# Patient Record
Sex: Female | Born: 1940
Health system: Southern US, Community
[De-identification: ages and names within clinical notes are randomized; demographics above are authoritative.]

## PROBLEM LIST (undated history)

## (undated) DIAGNOSIS — Z923 Personal history of irradiation: Secondary | ICD-10-CM

## (undated) DIAGNOSIS — C9002 Multiple myeloma in relapse: Secondary | ICD-10-CM

## (undated) DIAGNOSIS — I10 Essential (primary) hypertension: Secondary | ICD-10-CM

## (undated) DIAGNOSIS — E7800 Pure hypercholesterolemia, unspecified: Secondary | ICD-10-CM

## (undated) DIAGNOSIS — C801 Malignant (primary) neoplasm, unspecified: Secondary | ICD-10-CM

## (undated) DIAGNOSIS — M549 Dorsalgia, unspecified: Secondary | ICD-10-CM

## (undated) HISTORY — DX: Pure hypercholesterolemia, unspecified: E78.00

## (undated) HISTORY — PX: BONE MARROW BIOPSY: SHX199

## (undated) HISTORY — DX: Essential (primary) hypertension: I10

## (undated) HISTORY — DX: Personal history of irradiation: Z92.3

---

## 1999-03-22 HISTORY — PX: TOTAL KNEE ARTHROPLASTY: SHX125

## 1999-07-27 ENCOUNTER — Other Ambulatory Visit: Admission: RE | Admit: 1999-07-27 | Discharge: 1999-07-27 | Payer: Self-pay | Admitting: Obstetrics and Gynecology

## 2000-08-01 ENCOUNTER — Encounter: Payer: Self-pay | Admitting: Specialist

## 2000-08-04 ENCOUNTER — Inpatient Hospital Stay (HOSPITAL_COMMUNITY): Admission: RE | Admit: 2000-08-04 | Discharge: 2000-08-07 | Payer: Self-pay | Admitting: Specialist

## 2000-08-28 ENCOUNTER — Encounter (HOSPITAL_COMMUNITY): Admission: RE | Admit: 2000-08-28 | Discharge: 2000-09-27 | Payer: Self-pay | Admitting: *Deleted

## 2000-09-20 ENCOUNTER — Other Ambulatory Visit: Admission: RE | Admit: 2000-09-20 | Discharge: 2000-09-20 | Payer: Self-pay | Admitting: Obstetrics and Gynecology

## 2000-09-28 ENCOUNTER — Encounter (HOSPITAL_COMMUNITY): Admission: RE | Admit: 2000-09-28 | Discharge: 2000-10-18 | Payer: Self-pay | Admitting: *Deleted

## 2001-06-12 ENCOUNTER — Encounter: Payer: Self-pay | Admitting: Internal Medicine

## 2001-06-12 ENCOUNTER — Ambulatory Visit (HOSPITAL_COMMUNITY): Admission: RE | Admit: 2001-06-12 | Discharge: 2001-06-12 | Payer: Self-pay | Admitting: Internal Medicine

## 2001-06-21 ENCOUNTER — Ambulatory Visit (HOSPITAL_COMMUNITY): Admission: RE | Admit: 2001-06-21 | Discharge: 2001-06-21 | Payer: Self-pay | Admitting: Internal Medicine

## 2001-06-21 ENCOUNTER — Encounter: Payer: Self-pay | Admitting: Internal Medicine

## 2001-10-01 ENCOUNTER — Emergency Department (HOSPITAL_COMMUNITY): Admission: EM | Admit: 2001-10-01 | Discharge: 2001-10-01 | Payer: Self-pay | Admitting: Emergency Medicine

## 2001-11-06 ENCOUNTER — Other Ambulatory Visit: Admission: RE | Admit: 2001-11-06 | Discharge: 2001-11-06 | Payer: Self-pay | Admitting: Obstetrics and Gynecology

## 2001-12-05 ENCOUNTER — Ambulatory Visit (HOSPITAL_COMMUNITY): Admission: RE | Admit: 2001-12-05 | Discharge: 2001-12-05 | Payer: Self-pay | Admitting: Internal Medicine

## 2001-12-05 ENCOUNTER — Encounter: Payer: Self-pay | Admitting: Internal Medicine

## 2002-06-13 ENCOUNTER — Ambulatory Visit (HOSPITAL_COMMUNITY): Admission: RE | Admit: 2002-06-13 | Discharge: 2002-06-13 | Payer: Self-pay | Admitting: Internal Medicine

## 2002-06-13 ENCOUNTER — Encounter: Payer: Self-pay | Admitting: Internal Medicine

## 2002-11-20 ENCOUNTER — Other Ambulatory Visit: Admission: RE | Admit: 2002-11-20 | Discharge: 2002-11-20 | Payer: Self-pay | Admitting: Obstetrics and Gynecology

## 2002-12-05 ENCOUNTER — Ambulatory Visit (HOSPITAL_COMMUNITY): Admission: RE | Admit: 2002-12-05 | Discharge: 2002-12-05 | Payer: Self-pay | Admitting: Obstetrics and Gynecology

## 2003-07-01 ENCOUNTER — Ambulatory Visit (HOSPITAL_COMMUNITY): Admission: RE | Admit: 2003-07-01 | Discharge: 2003-07-01 | Payer: Self-pay | Admitting: Internal Medicine

## 2003-10-28 ENCOUNTER — Other Ambulatory Visit: Admission: RE | Admit: 2003-10-28 | Discharge: 2003-10-28 | Payer: Self-pay | Admitting: Obstetrics and Gynecology

## 2004-11-01 ENCOUNTER — Other Ambulatory Visit: Admission: RE | Admit: 2004-11-01 | Discharge: 2004-11-01 | Payer: Self-pay | Admitting: Obstetrics and Gynecology

## 2007-01-11 ENCOUNTER — Ambulatory Visit (HOSPITAL_COMMUNITY): Admission: RE | Admit: 2007-01-11 | Discharge: 2007-01-11 | Payer: Self-pay | Admitting: Obstetrics and Gynecology

## 2007-06-04 ENCOUNTER — Ambulatory Visit (HOSPITAL_COMMUNITY): Admission: RE | Admit: 2007-06-04 | Discharge: 2007-06-04 | Payer: Self-pay | Admitting: Internal Medicine

## 2008-06-16 ENCOUNTER — Encounter: Admission: RE | Admit: 2008-06-16 | Discharge: 2008-06-16 | Payer: Self-pay | Admitting: Specialist

## 2010-03-24 ENCOUNTER — Ambulatory Visit (HOSPITAL_COMMUNITY)
Admission: RE | Admit: 2010-03-24 | Discharge: 2010-03-24 | Payer: Self-pay | Source: Home / Self Care | Attending: Internal Medicine | Admitting: Internal Medicine

## 2010-04-11 ENCOUNTER — Encounter: Payer: Self-pay | Admitting: Obstetrics and Gynecology

## 2010-08-06 NOTE — Discharge Summary (Signed)
[place]. [facility]  Patient:    Jamie Ewing, Jamie Ewing                       MRN: [id number] Adm. Date:  [id number] Disc. Date: [id number] Attending:  Erasmo Leventhal Dictator:   Dorie Rank, P.A. CC:         Dr. Nickola Major, [address], [place], [place] [address]   Discharge Summary  ADMISSION DIAGNOSIS:  Left knee medial compartment arthritis.  DISCHARGE DIAGNOSES: 1. Left knee medial compartment arthritis. 2. Mild postoperative hemorrhagic anemia, resolved.  PROCEDURE:  Left knee medial unicompartmental knee arthroplasty, surgeon Dr. Benny Lennert, assistant Dorie Rank, P.A.-C.  Anesthesia general followed by epidural.  No complications.  CONSULTATIONS:  None.  HISTORY OF PRESENT ILLNESS:  Ms. Melucci is a pleasant 70 year old female who has had medial left knee pain for quite some time now.  She tried nonsteroidal, knee injections and underwent a knee arthroscopy with partial medial meniscectomy and chondroplasty of the medial femoral condyle.  Her pain has been refractory to conservative treatment and is interfering with her activities of daily living.  Radiograph on June 29, 2000, of the left knee showed obvious narrowing and degeneration of the medial compartment of the left knee.  On reviewing her surgery notes from her arthroscopy, it was noted that the disease process was confined to the medial compartment with advanced changes there.  It was felt that due to the above-stated, she would benefit from undergoing a left knee unicompartmental arthroplasty.  The risks, benefits and the procedure were discussed with the patient and at that point she elected to proceed.  HOSPITAL COURSE:  The patient was admitted on Aug 04, 2000, and had the above-stated surgery without complications.  Postoperatively, she used a Fentanyl epidural for 24 hours for pain control.  After this was discontinued on Aug 05, 2000, she utilized one enteric coated aspirin a day for  DVT prophylaxis.  She utilized Darvocet for pain and IV morphine for her severe pain.  However, she was utilizing only Darvocet prior to discharge.  While in the operating room, her wound was dressed in a sterile fashion.  Her dressing was noted to be dry and intact on postop day #1.  On postop day #2, her dressing was changed and her wound was found to be free of any erythema or discharge.  Throughout the remainder of her hospital stay, her wound was monitored daily and found to be free of discharge or erythema.  While in the operating room, a Foley catheter was placed.  This was discontinued on postop day #1 and she was voiding well on her own throughout the remainder of her hospital stay.  A Hemovac drain was placed in the wound while in the operating room.  This was discontinued on postop day #1 without any difficulty.  The patient worked with physical therapy per the total knee protocol.  She was placed on weightbearing as tolerated activity status with walker and assistance.  She progressed well with physical therapy.  On the date of discharge, she was ambulating 210 feet.  Postoperatively, IV Ancef was used for 48 hours for postop infection control.  The CPM machine was utilized q.8h. and she tolerated this well throughout her hospital stay.  She wore a knee immobilizer to her left knee when walking.  A hemoglobin and hematocrit were obtained daily while in the hospital and her hemoglobin was found to be stable and she did not  require any blood transfusions.  A BMP was obtained on Aug 05, 2000, and was found to be within normal limits.  She utilized a nasal cannula of 2 L of oxygen for 24 hours postoperatively.  IV of D-5 1/2 normal saline at 75 cc per hour was started postoperatively.  This was KVO on postop day #1 when she was noted to be taking fluids well and discontinued prior to her discharge home.  Her diet was advanced as tolerated and by postop day #1, she was tolerating  food well.  She utilized a bed side incentive spirometer q.h. from 7 a.m. to 10 p.m. for 10 minutes throughout her hospital stay.  On Aug 07, 2000, the patient was progressing well with physical therapy and was felt to be medically stable for discharge.  Discharge planning worked with the patient for any types of durable medical equipment to be needed at home.  LABORATORY DATA AND X-RAY FINDINGS:  Hemoglobin and hematocrit on Aug 05, 2000, was 11.4 and 32.6 respectively.  On Aug 06, 2000, red blood cells are 3.83, hemoglobin 12.4, hematocrit 35.3.  On Aug 07, 2000, RBC was 3.98, hemoglobin 12.6 and hematocrit 36.7.  BMP on Aug 07, 2000, revealed a CO2 of 32, otherwise within normal limits.  PT on Aug 01, 2000, was 12.5, INR 0.9 and PTT 30.  Preop UA on Aug 01, 2000, was negative.  On Aug 01, 2000, ALT 21, Alk phos 73, total bilirubin 0.8.  EKG on Aug 01, 2000, revealed normal sinus rhythm and was confirmed by Dr. Nila Nephew.  X-rays are not available in the chart.  CONDITION ON DISCHARGE:  Improved.  DISPOSITION:  The patient was discharged home.  SPECIAL INSTRUCTIONS:  She was to have outpatient physical therapy.  She can shower on postop day #5 along as her wound was not draining to keep it covered and dry in the shower.  Dressing changes daily.  She was to call should she have any other questions or problems before her appointment.  Two Tylenol p.r.n. fever, if greater than 101 call the office.  DISCHARGE MEDICATIONS: 1. Darvocet-N 100 #30 with no refills. 2. Robaxin #30 with no refills. 3. Take an over-the-counter enteric coated aspirin one p.o. q.d.  DIET:  Regular diet.  FOLLOWUP:  Follow up with Dr. Thomasena Edis in two weeks from the date of surgery. She is to call for an appointment. DD:  08/18/00 TD:  08/18/00 Job: [id number]

## 2010-08-06 NOTE — Op Note (Signed)
[place]  Patient:    Jamie Ewing, Jamie Ewing                       MRN: [id number] Proc. Date: 08/04/00 Adm. Date:  [id number] Attending:  Erasmo Leventhal                           Operative Report  PREOPERATIVE DIAGNOSIS:  Left knee medial compartment _____ arthrosis.  POSTOPERATIVE DIAGNOSIS:  Left knee medial compartment _____ arthrosis.  PROCEDURE:  Left knee medial unicompartmental knee arthroplasty.  SURGEON:  R. Valma Cava, M.D.  ASSISTANT:  Dorie Rank, P.A.  ANESTHESIA:  General followed by epidural.  ESTIMATED BLOOD LOSS:  Less than 100 cc.  DRAINS:  One Hemovac.  COMPLICATIONS:  None.  DISPOSITION:  To recovery room stable.  OPERATIVE IMPLANTS:  The [employer] medial unicompartmental knee arthroplasty system was utilized, the Eius system.  An 8 mm tibia, small femur, both cemented.  DESCRIPTION OF PROCEDURE:  The patient was counseled in the holding area.  I reviewed the surgical procedure with her again in detail, risks and benefits, and she wished to proceed.  IV antibiotics were given.  She was taken to the OR, placed in the supine position under general anesthesia.  The left knee was examined and revealed full extension and flexion to 125.  No significant angular deformity.  Elevated, prepped and draped out in a sterile fashion. Tourniquet was inflated to 350 mmHg.  An anterior incision made through the skin and subcutaneous tissue, small veins electrocoagulated, skin flaps were developed.  Medial parapatellar arthrotomy was performed.  The patella was translated laterally but was not dislocated.  The knee was inspected.  The patellofemoral joint was unremarkable.  The lateral compartment was unremarkable.  The intercondylar notch and anterior and posterior cruciate ligament, and medial compartment were inspected.  Unfortunately, she had bone-on-bone contact, grade 4, with erosions full-thickness.  Some of  the fat pad was resected.  The anterior horn of the medial meniscus was removed.  We used the standard surgical technique.  The tibial extramedullary alignment guide was utilized, set for the correct alignment in the sagittal and frontal planes, and guide pins were then used to hold the tibial resection block in place.  With sizes of 2, 4, and 6 trial, we took a 4 mm cut off the proximal tibia.  Initially the reciprocating saw was used to make the sagittal cut at the appropriate level, going through the tibial spine, and this was just medial to the ACL insertion, protecting the posterior aspect of the knee and soft tissue structures throughout the case.  The transverse cut was then made. At this point with the tibial spacer, which was a little bit tight, we took another 2 mm off the tibia.  At this point, I could easily get in the 8 mm jig, meaning she was well-balanced in flexion.  Attention was directed to the femur, and the femur was sized, found to be a small.  At this point in time, the femoral cutting guide was placed using standard technique.  Extramedullary guide was hooked to this.  We checked the position based on the femoral head and shaft.  I had previously marked out the _____ over the femoral head and could palpate that.  After setting this and determining that she was a small, pins were placed, and then the central peg was drilled and left  in place, and then the posterior femoral condyle cut was made, removing 6 mm of the posterior femur.  At this time, this was then removed.  The reciprocating saw was utilized for vertical cut, and then the femoral burring jig was applied.  There was a little bit of an offset.  It was accommodated for, and the bow was used in standard fashion and it was removed, and the remainder of the bone was removed with a rongeur.  At this point in time, we had difficulty getting a posterior femoral condyle flush fit, and then the femoral component.   Each step was re-done, taking a little bit more of the posterior femoral condyle, and re-burring.  Eventually we got a satisfactory fit.  With the small in place, we then sized the tibia.  The tibia was an 8 mm medium.  The appropriate jig was in place.  She was then put through a range of motion with trials in place.  She was well-balanced in flexion and extension, and axial and mechanical axis and anatomical axis. Also, the soft tissues were well-balanced.  The tibial trial was then removed, and the tibial keel was performed in a standard fashion.  At this point in time utilizing Modern cement technique, pulsatile lavage, etc., the femoral component was then cemented into place. At this point, I was having difficulty getting into the tibial component with this, and therefore it was decided to do it in two stages.  The femoral component was then firmly cemented in place, excess cement was removed, she was well-seated, put another 8 mm medium trial in, had excellent range of motion and soft tissue balance.  This was then removed.  Sponge was placed. The knee was then wrapped, and the tourniquet was deflated after 2 hours and 9 minutes.  At this point in time, I then let the knee revascularize for the next 15 minutes while another batch of cement was made.  At this point in time, the knee was then exsanguinated again after 15 minutes revascularization.  It was again irrigated and dried, and then the tibial component, 8 mm thickness with a keel, was cemented into place with excellent technique.  The knee had been placed into full extension while this was being cured, and then after 10 minutes the tourniquet was decreased to let down again.  At this point in time, she was well-seated on both femur and tibia. It was extremely well-balanced and had excellent alignment.  Hemostasis had been obtained.  There was minimal bleeding.  A medium Hemovac drain was placed and brought out through a  separate stab wound.  The knee was then closed in layers.  The arthrotomy and _____ was closed with Vicryl, the subcutaneous tissue Vicryl, skin closed with staples.  Also note that after the first  letdown of the tourniquet, we gave her another gram of Ancef intravenously. After the skin was closed, dressings were applied, the drain was hooked to suction.  She had excellent pulse of the foot and ankle at the end of the case, excellent alignment, and well-balanced, full range of motion.  She was then turned to the lateral position, and an epidural catheter was placed for postoperative pain control.  She was then awakened, extubated, and taken out from the operating room to PACU in stable condition.  There were no complications.  Sponge and needle count were correct. DD:  08/04/00 TD:  08/07/00 Job: [id number]

## 2010-08-06 NOTE — Procedures (Signed)
. [facility]  Patient:    Jamie Ewing, Jamie Ewing                       MRN: [id number] Proc. Date: 07/30/00 Adm. Date:  [id number] Attending:  Erasmo Leventhal CC:         Anesthesia Department                           Procedure Report  PROCEDURE:  Epidural catheter placement for postoperative pain relief.  DESCRIPTION OF PROCEDURE:  I was consulted by Reuel Boom L. Thomasena Edis, M.D., for placement of an epidural catheter into [name] for postoperative pain relief.  I had the opportunity to discuss the procedure with the patient preoperatively.  She elected to the catheter placement.  At the end of the procedure, prior to emergence from anesthesia, the patient was turned in the left lateral decubitus position.  The back was prepped with Betadine.  Using a #17 Tuohy needle, the epidural space was easily cannulated at the L2-3 interspace in the midline using a loss of resistance technique. The catheter was inserted 5 cm into the epidural space and the needle removed. After negative aspiration, 100 mcg of fentanyl with 10 cc of 0.5% Xylocaine was incrementally injected.  The catheter was then firmly affixed to the patients back.  She was turned supine, extubated, and taken to the recovery room in stable condition.  In the PACU, a fentayl/Marcaine infusion was begun, and she will be followed on the floor by anesthesiology service. DD:  08/04/00 TD:  08/07/00 Job: [id number]

## 2010-08-06 NOTE — H&P (Signed)
[place]. [facility]  Patient:    Jamie Ewing, Jamie Ewing                         MRN: [id number] Adm. Date:  08/04/00 Attending:  R. Valma Cava, M.D. Dictator:   Dorie Rank, P.A.-C. CC:         Kingsley Callander. Ouida Sills, M.D., [place], [place]   History and Physical  DATE OF BIRTH: 09/10/1940  CHIEF COMPLAINT: Left knee pain.  HISTORY OF PRESENT ILLNESS: Ms. Galligan is a pleasant 70 year old female who has had medial left knee pain for quite some time now.  She has tried steroid injections to the knee and has undergone left knee arthroscopy with partial medial meniscectomy and chondroplasty of the medial femoral condyle.  The pain is to the point where it is interfering with her activities of daily living and she presented to the office stating that she wished to do something permanent.  On physical examination it was noted that she had neutral alignment of the left knee, trace effusion, and range of motion to the left knee was 0 to about 130 degrees.  The cruciate and collateral ligaments were noted to be stable on physical examination.  She had diffuse tenderness over the medial joint line.  Radiographs from June 29, 2000 of the left knee showed obvious narrowing and degeneration of the medial compartment.  On review of her surgery notes from her arthroscopy it was noted that the disease process was confined to the medial compartment with advanced changes there. Due to failure of conservative treatment it was felt that she would best benefit from undergoing a left knee unicompartmental arthroplasty.  The risks and benefits as well as the procedure were discussed with the patient and at that point she elected to proceed.  She obtained medical clearance from her family medical doctor, Dr. Kingsley Callander. Ouida Sills of [place], [place].  PAST MEDICAL HISTORY: Osteoarthritis of bilateral knees, with left greater than right.  Otherwise, she has been in good health.  She did have  a large cheloid scar formation on her chest about five years ago which she did have successfully removed.  PAST SURGICAL HISTORY:  1. Cheloid scar removal to anterior chest x 4-5 years ago.  2. Left knee arthroscopy with partial medial meniscectomy and chondroplasty     of the medial femoral condyle.  CURRENT MEDICATIONS:  1. Vioxx 25 mg one p.o. q.d.  2. Glucosamine chondroitin p.o. q.d.  3. Tylenol Arthritis p.r.n.  ALLERGIES: No known drug allergies.  SOCIAL HISTORY: The patient is married and has two children, one daughter who lives at home with her and can help her after the surgery.  She smokes approximately one packs of cigarettes per week.  She denies any regular use of alcohol.  She plans for home health physical therapy.  She did not donate any autologous blood for this procedure.  FAMILY HISTORY: Mother deceased at age 93, history of tuberculosis.  Father deceased at age 16 with history of of kidney failure.  She does have a paternal aunt with diabetes mellitus.  REVIEW OF SYSTEMS: GENERAL: No fever, chills, night sweats, or bleeding tendencies.  PULMONARY: No shortness of breath, productive cough, or hemoptysis.  CARDIOVASCULAR: No chest pain, angina, or orthopnea.  GI: No nausea, vomiting, diarrhea, melena, or bloody stools.  She does have occasional constipation, which she utilizes FiberCon for.  GU: No dysuria, hematuria, or discharge.  MUSCULOSKELETAL: Left knee pain  as described above in the History of Present Illness.  CNS: No blurred or double vision, seizures, headache, or paralysis.  PHYSICAL EXAMINATION:  GENERAL: Pleasant 71 year old black female, well-developed, well-nourished, and in no acute distress.  VITAL SIGNS: Pulse 80, respirations 20, blood pressure 143/75.  HEENT: Head normocephalic, atraumatic.  Oropharynx clear.  NECK: Supple, negative for carotid bruits bilaterally.  CHEST: Lungs clear to auscultation bilaterally.  No wheezes, rales  or rhonchi.  BREAST: Not pertinent to present illness.  HEART: Regular rate and rhythm.  No murmurs, rubs, or gallops.  S1 and S2 heard on auscultation.  ABDOMEN: Soft, nontender.  Positive bowel sounds in all four quadrants.  GU: Not pertinent to present illness.  EXTREMITIES: Please see History of Present Illness for physical examination of left knee.  SKIN: No rashes or lesions appreciated on examination.  The patient does have a large scar to her anterior chest wall.  She has three scars to her anterior knee that show no cheloid formation.  LABORATORY DATA: Laboratories and x-rays are pending at this time.  IMPRESSION: Left knee osteoarthritis.  PLAN: The patient is scheduled for left knee unicompartmental arthroplasty by Dr. Benny Lennert at [facility] on Aug 04, 2000.DD: 07/27/00 TD:  07/28/00 Job: [id number]

## 2011-05-31 ENCOUNTER — Encounter (INDEPENDENT_AMBULATORY_CARE_PROVIDER_SITE_OTHER): Payer: Self-pay | Admitting: Surgery

## 2011-05-31 ENCOUNTER — Ambulatory Visit (INDEPENDENT_AMBULATORY_CARE_PROVIDER_SITE_OTHER): Payer: BC Managed Care – PPO | Admitting: Surgery

## 2011-05-31 VITALS — BP 142/82 | HR 92 | Resp 16 | Ht 63.0 in | Wt 197.0 lb

## 2011-05-31 DIAGNOSIS — D1720 Benign lipomatous neoplasm of skin and subcutaneous tissue of unspecified limb: Secondary | ICD-10-CM

## 2011-05-31 DIAGNOSIS — D1739 Benign lipomatous neoplasm of skin and subcutaneous tissue of other sites: Secondary | ICD-10-CM

## 2011-05-31 NOTE — Patient Instructions (Signed)
Lipoma A lipoma is a noncancerous (benign) tumor composed of fat cells. They are usually found under the skin (subcutaneous). A lipoma may occur in any tissue of the body that contains fat. Common areas for lipomas to appear include the back, shoulders, buttocks, and thighs. Lipomas are a very common soft tissue growth. They are soft and grow slowly. Most problems caused by a lipoma depend on where it is growing. DIAGNOSIS  A lipoma can be diagnosed with a physical exam. These tumors rarely become cancerous, but radiographic studies can help determine this for certain. Studies used may include:  Computerized X-ray scans (CT or CAT scan).   Computerized magnetic scans (MRI).  TREATMENT  Small lipomas that are not causing problems may be watched. If a lipoma continues to enlarge or causes problems, removal is often the best treatment. Lipomas can also be removed to improve appearance. Surgery is done to remove the fatty cells and the surrounding capsule. Most often, this is done with medicine that numbs the area (local anesthetic). The removed tissue is examined under a microscope to make sure it is not cancerous. Keep all follow-up appointments with your caregiver. SEEK MEDICAL CARE IF:   The lipoma becomes larger or hard.   The lipoma becomes painful, red, or increasingly swollen. These could be signs of infection or a more serious condition.  Document Released: 02/25/2002 Document Revised: 02/24/2011 Document Reviewed: 08/07/2009 [employer] Patient Information 2012 [employer]. 

## 2011-05-31 NOTE — Progress Notes (Signed)
  CC: Lump in her right axillary area HPI: This patient has a small lump in the anterior axillary line high in the axilla. She noticed about six or seven months ago. He thinks perhaps gotten a little bit larger. It is not painful and has not become symptomatic in any fashion. Her primary care physician thought it was probably benign but asked [place] to see her in case she wished to have this removed.   ROS: Her 12 point review of systems was negative.  MEDS:  No current outpatient prescriptions on file prior to visit.    ALLERGIES:  No Known Allergies   PE Vital signs:BP 142/82  Pulse 92  Resp 16  Ht 5\' 3"  (1.6 m)  Wt 197 lb (89.359 kg)  BMI 34.90 kg/m2  General: The patient is alert oriented healthy appearing Axilla: In the right anterior axillary line at the very superior edge is a 1.5 cm soft, mobile, nontender mass. It is visible is a slight protrusion. There is no evidence of other adenopathy or problems.  Data Reviewed I have reviewed the notes from her primary physician  Assessment Lipoma right axillary area  Plan I discussed alternatives including excision versus followup. I told her that this appears to be benign and can safely be followed. If he becomes symptomatic or margins then we should reconsider that decision and excise it. She was happy with followup.  CC; Dr Ouida Sills

## 2014-07-21 DIAGNOSIS — E785 Hyperlipidemia, unspecified: Secondary | ICD-10-CM | POA: Diagnosis not present

## 2014-07-21 DIAGNOSIS — Z79899 Other long term (current) drug therapy: Secondary | ICD-10-CM | POA: Diagnosis not present

## 2014-07-21 DIAGNOSIS — M1990 Unspecified osteoarthritis, unspecified site: Secondary | ICD-10-CM | POA: Diagnosis not present

## 2014-07-21 DIAGNOSIS — R7301 Impaired fasting glucose: Secondary | ICD-10-CM | POA: Diagnosis not present

## 2014-07-21 DIAGNOSIS — I10 Essential (primary) hypertension: Secondary | ICD-10-CM | POA: Diagnosis not present

## 2014-07-28 DIAGNOSIS — Z23 Encounter for immunization: Secondary | ICD-10-CM | POA: Diagnosis not present

## 2014-07-28 DIAGNOSIS — R7301 Impaired fasting glucose: Secondary | ICD-10-CM | POA: Diagnosis not present

## 2014-07-28 DIAGNOSIS — E785 Hyperlipidemia, unspecified: Secondary | ICD-10-CM | POA: Diagnosis not present

## 2014-07-28 DIAGNOSIS — Z0001 Encounter for general adult medical examination with abnormal findings: Secondary | ICD-10-CM | POA: Diagnosis not present

## 2014-07-28 DIAGNOSIS — I10 Essential (primary) hypertension: Secondary | ICD-10-CM | POA: Diagnosis not present

## 2015-01-30 DIAGNOSIS — I10 Essential (primary) hypertension: Secondary | ICD-10-CM | POA: Diagnosis not present

## 2015-01-30 DIAGNOSIS — Z23 Encounter for immunization: Secondary | ICD-10-CM | POA: Diagnosis not present

## 2015-01-30 DIAGNOSIS — Z6834 Body mass index (BMI) 34.0-34.9, adult: Secondary | ICD-10-CM | POA: Diagnosis not present

## 2015-02-02 DIAGNOSIS — Z13820 Encounter for screening for osteoporosis: Secondary | ICD-10-CM | POA: Diagnosis not present

## 2015-02-02 DIAGNOSIS — Z6832 Body mass index (BMI) 32.0-32.9, adult: Secondary | ICD-10-CM | POA: Diagnosis not present

## 2015-02-02 DIAGNOSIS — Z1231 Encounter for screening mammogram for malignant neoplasm of breast: Secondary | ICD-10-CM | POA: Diagnosis not present

## 2015-02-02 DIAGNOSIS — Z01419 Encounter for gynecological examination (general) (routine) without abnormal findings: Secondary | ICD-10-CM | POA: Diagnosis not present

## 2015-02-02 DIAGNOSIS — N958 Other specified menopausal and perimenopausal disorders: Secondary | ICD-10-CM | POA: Diagnosis not present

## 2015-02-24 DIAGNOSIS — N888 Other specified noninflammatory disorders of cervix uteri: Secondary | ICD-10-CM | POA: Diagnosis not present

## 2015-02-24 DIAGNOSIS — R87610 Atypical squamous cells of undetermined significance on cytologic smear of cervix (ASC-US): Secondary | ICD-10-CM | POA: Diagnosis not present

## 2015-02-24 DIAGNOSIS — N879 Dysplasia of cervix uteri, unspecified: Secondary | ICD-10-CM | POA: Diagnosis not present

## 2015-03-18 DIAGNOSIS — N870 Mild cervical dysplasia: Secondary | ICD-10-CM | POA: Diagnosis not present

## 2015-03-18 DIAGNOSIS — N72 Inflammatory disease of cervix uteri: Secondary | ICD-10-CM | POA: Diagnosis not present

## 2015-07-23 DIAGNOSIS — I10 Essential (primary) hypertension: Secondary | ICD-10-CM | POA: Diagnosis not present

## 2015-07-23 DIAGNOSIS — E785 Hyperlipidemia, unspecified: Secondary | ICD-10-CM | POA: Diagnosis not present

## 2015-07-23 DIAGNOSIS — Z79899 Other long term (current) drug therapy: Secondary | ICD-10-CM | POA: Diagnosis not present

## 2015-07-23 DIAGNOSIS — R7301 Impaired fasting glucose: Secondary | ICD-10-CM | POA: Diagnosis not present

## 2015-08-10 DIAGNOSIS — Z779 Other contact with and (suspected) exposures hazardous to health: Secondary | ICD-10-CM | POA: Diagnosis not present

## 2015-08-10 DIAGNOSIS — R87612 Low grade squamous intraepithelial lesion on cytologic smear of cervix (LGSIL): Secondary | ICD-10-CM | POA: Diagnosis not present

## 2015-09-21 DIAGNOSIS — I10 Essential (primary) hypertension: Secondary | ICD-10-CM | POA: Diagnosis not present

## 2015-09-21 DIAGNOSIS — Z6834 Body mass index (BMI) 34.0-34.9, adult: Secondary | ICD-10-CM | POA: Diagnosis not present

## 2015-09-21 DIAGNOSIS — E785 Hyperlipidemia, unspecified: Secondary | ICD-10-CM | POA: Diagnosis not present

## 2015-09-21 DIAGNOSIS — Z0001 Encounter for general adult medical examination with abnormal findings: Secondary | ICD-10-CM | POA: Diagnosis not present

## 2015-09-21 DIAGNOSIS — M1990 Unspecified osteoarthritis, unspecified site: Secondary | ICD-10-CM | POA: Diagnosis not present

## 2016-01-08 DIAGNOSIS — Z23 Encounter for immunization: Secondary | ICD-10-CM | POA: Diagnosis not present

## 2016-02-24 DIAGNOSIS — H18413 Arcus senilis, bilateral: Secondary | ICD-10-CM | POA: Diagnosis not present

## 2016-02-24 DIAGNOSIS — H5200 Hypermetropia, unspecified eye: Secondary | ICD-10-CM | POA: Diagnosis not present

## 2016-03-08 DIAGNOSIS — Z124 Encounter for screening for malignant neoplasm of cervix: Secondary | ICD-10-CM | POA: Diagnosis not present

## 2016-03-08 DIAGNOSIS — Z1231 Encounter for screening mammogram for malignant neoplasm of breast: Secondary | ICD-10-CM | POA: Diagnosis not present

## 2016-03-08 DIAGNOSIS — Z779 Other contact with and (suspected) exposures hazardous to health: Secondary | ICD-10-CM | POA: Diagnosis not present

## 2016-03-08 DIAGNOSIS — Z6832 Body mass index (BMI) 32.0-32.9, adult: Secondary | ICD-10-CM | POA: Diagnosis not present

## 2016-03-11 DIAGNOSIS — Z0100 Encounter for examination of eyes and vision without abnormal findings: Secondary | ICD-10-CM | POA: Diagnosis not present

## 2016-03-24 DIAGNOSIS — I10 Essential (primary) hypertension: Secondary | ICD-10-CM | POA: Diagnosis not present

## 2016-03-24 DIAGNOSIS — R7301 Impaired fasting glucose: Secondary | ICD-10-CM | POA: Diagnosis not present

## 2016-04-29 DIAGNOSIS — Z6834 Body mass index (BMI) 34.0-34.9, adult: Secondary | ICD-10-CM | POA: Diagnosis not present

## 2016-04-29 DIAGNOSIS — R0789 Other chest pain: Secondary | ICD-10-CM | POA: Diagnosis not present

## 2016-10-07 DIAGNOSIS — Z79899 Other long term (current) drug therapy: Secondary | ICD-10-CM | POA: Diagnosis not present

## 2016-10-07 DIAGNOSIS — E785 Hyperlipidemia, unspecified: Secondary | ICD-10-CM | POA: Diagnosis not present

## 2016-10-07 DIAGNOSIS — I10 Essential (primary) hypertension: Secondary | ICD-10-CM | POA: Diagnosis not present

## 2016-10-07 DIAGNOSIS — R7301 Impaired fasting glucose: Secondary | ICD-10-CM | POA: Diagnosis not present

## 2016-10-10 ENCOUNTER — Ambulatory Visit (HOSPITAL_COMMUNITY)
Admission: RE | Admit: 2016-10-10 | Discharge: 2016-10-10 | Disposition: A | Discharge status: Home / Self Care | Payer: Medicare HMO | Source: Ambulatory Visit | Attending: Internal Medicine | Admitting: Internal Medicine

## 2016-10-10 ENCOUNTER — Other Ambulatory Visit (HOSPITAL_COMMUNITY): Payer: Self-pay | Admitting: Internal Medicine

## 2016-10-10 DIAGNOSIS — R079 Chest pain, unspecified: Secondary | ICD-10-CM

## 2016-10-10 DIAGNOSIS — S2232XA Fracture of one rib, left side, initial encounter for closed fracture: Secondary | ICD-10-CM | POA: Diagnosis not present

## 2016-10-10 DIAGNOSIS — Z0001 Encounter for general adult medical examination with abnormal findings: Secondary | ICD-10-CM | POA: Diagnosis not present

## 2016-10-10 DIAGNOSIS — R0789 Other chest pain: Secondary | ICD-10-CM | POA: Diagnosis not present

## 2016-10-10 DIAGNOSIS — I10 Essential (primary) hypertension: Secondary | ICD-10-CM | POA: Diagnosis not present

## 2016-10-10 DIAGNOSIS — X58XXXA Exposure to other specified factors, initial encounter: Secondary | ICD-10-CM | POA: Diagnosis not present

## 2016-10-10 DIAGNOSIS — Z6834 Body mass index (BMI) 34.0-34.9, adult: Secondary | ICD-10-CM | POA: Diagnosis not present

## 2016-12-25 ENCOUNTER — Encounter (HOSPITAL_COMMUNITY): Payer: Self-pay

## 2016-12-25 ENCOUNTER — Emergency Department (HOSPITAL_COMMUNITY)
Admission: EM | Admit: 2016-12-25 | Discharge: 2016-12-25 | Disposition: A | Discharge status: Home / Self Care | Payer: Medicare HMO | Attending: Emergency Medicine | Admitting: Emergency Medicine

## 2016-12-25 ENCOUNTER — Emergency Department (HOSPITAL_COMMUNITY): Payer: Medicare HMO

## 2016-12-25 DIAGNOSIS — R202 Paresthesia of skin: Secondary | ICD-10-CM | POA: Diagnosis not present

## 2016-12-25 DIAGNOSIS — R200 Anesthesia of skin: Secondary | ICD-10-CM | POA: Diagnosis not present

## 2016-12-25 DIAGNOSIS — M542 Cervicalgia: Secondary | ICD-10-CM | POA: Diagnosis not present

## 2016-12-25 DIAGNOSIS — I10 Essential (primary) hypertension: Secondary | ICD-10-CM | POA: Diagnosis not present

## 2016-12-25 DIAGNOSIS — Z79899 Other long term (current) drug therapy: Secondary | ICD-10-CM | POA: Diagnosis not present

## 2016-12-25 DIAGNOSIS — Z96659 Presence of unspecified artificial knee joint: Secondary | ICD-10-CM | POA: Diagnosis not present

## 2016-12-25 MED ORDER — NAPROXEN 375 MG PO TABS: 375.0000 mg | ORAL_TABLET | Freq: Two times a day (BID) | ORAL | 0 refills | Status: DC

## 2016-12-25 MED ORDER — HYDROCODONE-ACETAMINOPHEN 5-325 MG PO TABS: 1.0000 | ORAL_TABLET | ORAL | 0 refills | Status: DC | PRN

## 2016-12-25 MED ORDER — PREDNISONE 10 MG (48) PO TBPK: ORAL_TABLET | ORAL | 0 refills | Status: DC

## 2016-12-25 MED ORDER — KETOROLAC TROMETHAMINE 60 MG/2ML IM SOLN
60.0000 mg | Freq: Once | INTRAMUSCULAR | Status: AC
  Administered 2016-12-25: 60 mg via INTRAMUSCULAR
  Filled 2016-12-25: qty 2

## 2016-12-25 NOTE — ED Triage Notes (Signed)
Pt reports pain in left shoulder pain for on week.Pain is going into left arm and into fingers. Pt reports arm and fingers are numb and tingling. No injury noted

## 2016-12-25 NOTE — Discharge Instructions (Signed)
I suspect that this tingling is from an irritated nerve that originates in your neck. Your neck x-ray shows lots of degenerative changes. Prescription for pain medicine, anti-inflammatory medicine, prednisone. Take all medications with food. Follow-up your primary care doctor. If symptoms persist, you may need an MRI x-ray of your neck

## 2016-12-25 NOTE — ED Provider Notes (Signed)
[facility] DEPT Provider Note   CSN: [id number] Arrival date & time: 12/25/16  1133     History   Chief Complaint Chief Complaint  Patient presents with  . Arm Pain    HPI Jamie Ewing is a 76 y.o. female.  Pain in left shoulder with radiation to left upper extremity specifically to the left fifth digit. Patient reports left arm is numb and tingly. However, she has full range of motion of the extremity. No recent trauma to the neck or excessive lifting or bending. She may have slept on the neck awkwardly. Past medical history includes hypertension and hypercholesterolemia. She has tried Tylenol arthritis for the pain with minimal relief.      Past Medical History:  Diagnosis Date  . Hypercholesteremia   . Hypertension     Patient Active Problem List   Diagnosis Date Noted  . Lipoma of axilla 05/31/2011    Past Surgical History:  Procedure Laterality Date  . TOTAL KNEE ARTHROPLASTY  2001    OB History    No data available       Home Medications    Prior to Admission medications   Medication Sig Start Date End Date Taking? Authorizing Provider  acetaminophen (TYLENOL) 650 MG CR tablet Take 1,300 mg by mouth every 8 (eight) hours as needed for pain.   Yes [provider]  amLODipine (NORVASC) 5 MG tablet Take 5 mg by mouth daily.  05/13/11  Yes [provider]  metoprolol tartrate (LOPRESSOR) 25 MG tablet Take 25 mg by mouth daily.   Yes [provider]  HYDROcodone-acetaminophen (NORCO/VICODIN) 5-325 MG tablet Take 1 tablet by mouth every 4 (four) hours as needed. 12/25/16   Nat Christen, MD  naproxen (NAPROSYN) 375 MG tablet Take 1 tablet (375 mg total) by mouth 2 (two) times daily. 12/25/16   Nat Christen, MD  predniSONE (STERAPRED UNI-PAK 48 TAB) 10 MG (48) TBPK tablet 2 daily for 4 days, one daily for 4 days 12/25/16   Nat Christen, MD    Family History No family history on file.  Social History Social History  Substance Use  Topics  . Smoking status: Former Smoker    Quit date: 05/31/2006  . Smokeless tobacco: Not on file  . Alcohol use Yes     Comment: wine occassionally     Allergies   Patient has no known allergies.   Review of Systems Review of Systems  All other systems reviewed and are negative.    Physical Exam Updated Vital Signs BP (!) 144/67   Pulse 79   Temp 99.2 F (37.3 C) (Oral)   Resp 18   Ht 5\' 3"  (1.6 m)   Wt 83.9 kg (185 lb)   SpO2 96%   BMI 32.77 kg/m   Physical Exam  Constitutional: She is oriented to person, place, and time. She appears well-developed and well-nourished.  HENT:  Head: Normocephalic and atraumatic.  Eyes: Conjunctivae are normal.  Neck: Neck supple.  Cardiovascular: Normal rate and regular rhythm.   Pulmonary/Chest: Effort normal and breath sounds normal.  Abdominal: Soft. Bowel sounds are normal.  Musculoskeletal: Normal range of motion.  Neurological: She is alert and oriented to person, place, and time.  No obvious neurological deficit in the left upper extremity  Skin: Skin is warm and dry.  Psychiatric: She has a normal mood and affect. Her behavior is normal.  Nursing note and vitals reviewed.    ED Treatments / Results  Labs (all labs ordered  are listed, but only abnormal results are displayed) Labs Reviewed - No data to display  EKG  EKG Interpretation None       Radiology Dg Cervical Spine Complete  Result Date: 12/25/2016 CLINICAL DATA:  Left upper extremity radicular pain radiating to the fifth finger. No reported injury. EXAM: CERVICAL SPINE - COMPLETE 4+ VIEW COMPARISON:  None. FINDINGS: On the lateral and swimmer's views the cervical spine is visualized to the level of C7-T1. Straightening of the cervical spine. Pre-vertebral soft tissues are within normal limits. No fracture is detected in the cervical spine. Dens is well positioned between the lateral masses of C1. Marked multilevel degenerative disc disease in the mid to  lower cervical spine. Minimal 2 mm retrolisthesis at C5-6. Mild bilateral facet arthropathy. Mild degenerative stromal stenosis bilaterally at C4-5. Moderate degenerative foraminal stenosis bilaterally at C6-7. No aggressive-appearing focal osseous lesions. IMPRESSION: 1. Marked multilevel degenerative disc disease in the mid to lower cervical spine . 2. Minimal 2 mm retrolisthesis at C5-6. 3. Mild-to-moderate bilateral degenerative neural foraminal stenosis in the mid to lower cervical spine as detailed . 4. Straightening of the cervical spine, usually due to positioning and/or muscle spasm . Electronically Signed   By: Ilona Sorrel M.D.   On: 12/25/2016 16:35    Procedures Procedures (including critical care time)  Medications Ordered in ED Medications  ketorolac (TORADOL) injection 60 mg (60 mg Intramuscular Given 12/25/16 1712)     Initial Impression / Assessment and Plan / ED Course  I have reviewed the triage vital signs and the nursing notes.  Pertinent labs & imaging results that were available during my care of the patient were reviewed by me and considered in my medical decision making (see chart for details).     Patient has no obvious gross neurological deficits. Plain films of the cervical spine reveal marketed multilevel degenerative changes in the lower cervical spine. Tingling appears to be in the C8 dermatome. Patient advised to follow-up with her primary care doctor and possibly get an MRI if symptoms persist. Discharge medications Vicodin, prednisone, Naprosyn 375 mg.  Final Clinical Impressions(s) / ED Diagnoses   Final diagnoses:  Tingling of upper extremity    New Prescriptions New Prescriptions   HYDROCODONE-ACETAMINOPHEN (NORCO/VICODIN) 5-325 MG TABLET    Take 1 tablet by mouth every 4 (four) hours as needed.   NAPROXEN (NAPROSYN) 375 MG TABLET    Take 1 tablet (375 mg total) by mouth 2 (two) times daily.   PREDNISONE (STERAPRED UNI-PAK 48 TAB) 10 MG (48) TBPK  TABLET    2 daily for 4 days, one daily for 4 days     Nat Christen, MD 12/25/16 1725

## 2016-12-29 DIAGNOSIS — Z23 Encounter for immunization: Secondary | ICD-10-CM | POA: Diagnosis not present

## 2016-12-29 DIAGNOSIS — M5412 Radiculopathy, cervical region: Secondary | ICD-10-CM | POA: Diagnosis not present

## 2017-01-12 DIAGNOSIS — M5412 Radiculopathy, cervical region: Secondary | ICD-10-CM | POA: Diagnosis not present

## 2017-01-26 DIAGNOSIS — M5412 Radiculopathy, cervical region: Secondary | ICD-10-CM | POA: Diagnosis not present

## 2017-03-20 DIAGNOSIS — Z01419 Encounter for gynecological examination (general) (routine) without abnormal findings: Secondary | ICD-10-CM | POA: Diagnosis not present

## 2017-03-20 DIAGNOSIS — Z779 Other contact with and (suspected) exposures hazardous to health: Secondary | ICD-10-CM | POA: Diagnosis not present

## 2017-03-20 DIAGNOSIS — Z6831 Body mass index (BMI) 31.0-31.9, adult: Secondary | ICD-10-CM | POA: Diagnosis not present

## 2017-03-20 DIAGNOSIS — Z1231 Encounter for screening mammogram for malignant neoplasm of breast: Secondary | ICD-10-CM | POA: Diagnosis not present

## 2017-03-31 DIAGNOSIS — M4692 Unspecified inflammatory spondylopathy, cervical region: Secondary | ICD-10-CM | POA: Diagnosis not present

## 2017-03-31 DIAGNOSIS — M5030 Other cervical disc degeneration, unspecified cervical region: Secondary | ICD-10-CM | POA: Diagnosis not present

## 2017-04-10 DIAGNOSIS — Z6832 Body mass index (BMI) 32.0-32.9, adult: Secondary | ICD-10-CM | POA: Diagnosis not present

## 2017-04-10 DIAGNOSIS — I6523 Occlusion and stenosis of bilateral carotid arteries: Secondary | ICD-10-CM | POA: Diagnosis not present

## 2017-04-10 DIAGNOSIS — I10 Essential (primary) hypertension: Secondary | ICD-10-CM | POA: Diagnosis not present

## 2017-04-13 ENCOUNTER — Other Ambulatory Visit (HOSPITAL_COMMUNITY): Payer: Self-pay | Admitting: Internal Medicine

## 2017-04-13 DIAGNOSIS — R0989 Other specified symptoms and signs involving the circulatory and respiratory systems: Secondary | ICD-10-CM

## 2017-04-18 ENCOUNTER — Ambulatory Visit (HOSPITAL_COMMUNITY): Payer: Medicare HMO

## 2017-04-21 ENCOUNTER — Ambulatory Visit (HOSPITAL_COMMUNITY)
Admission: RE | Admit: 2017-04-21 | Discharge: 2017-04-21 | Disposition: A | Discharge status: Home / Self Care | Payer: Medicare HMO | Source: Ambulatory Visit | Attending: Internal Medicine | Admitting: Internal Medicine

## 2017-04-21 DIAGNOSIS — I6523 Occlusion and stenosis of bilateral carotid arteries: Secondary | ICD-10-CM | POA: Insufficient documentation

## 2017-04-21 DIAGNOSIS — R0989 Other specified symptoms and signs involving the circulatory and respiratory systems: Secondary | ICD-10-CM | POA: Insufficient documentation

## 2017-09-29 ENCOUNTER — Emergency Department (HOSPITAL_COMMUNITY)
Admission: EM | Admit: 2017-09-29 | Discharge: 2017-09-29 | Disposition: A | Discharge status: Home / Self Care | Payer: Medicare HMO | Attending: Emergency Medicine | Admitting: Emergency Medicine

## 2017-09-29 ENCOUNTER — Encounter (HOSPITAL_COMMUNITY): Payer: Self-pay

## 2017-09-29 ENCOUNTER — Emergency Department (HOSPITAL_COMMUNITY): Payer: Medicare HMO

## 2017-09-29 ENCOUNTER — Other Ambulatory Visit: Payer: Self-pay

## 2017-09-29 DIAGNOSIS — Z87891 Personal history of nicotine dependence: Secondary | ICD-10-CM | POA: Insufficient documentation

## 2017-09-29 DIAGNOSIS — I10 Essential (primary) hypertension: Secondary | ICD-10-CM | POA: Insufficient documentation

## 2017-09-29 DIAGNOSIS — Z79899 Other long term (current) drug therapy: Secondary | ICD-10-CM | POA: Insufficient documentation

## 2017-09-29 DIAGNOSIS — M5441 Lumbago with sciatica, right side: Secondary | ICD-10-CM | POA: Insufficient documentation

## 2017-09-29 DIAGNOSIS — M545 Low back pain: Secondary | ICD-10-CM | POA: Diagnosis not present

## 2017-09-29 HISTORY — DX: Dorsalgia, unspecified: M54.9

## 2017-09-29 LAB — URINALYSIS, ROUTINE W REFLEX MICROSCOPIC
BILIRUBIN URINE: NEGATIVE
GLUCOSE, UA: NEGATIVE mg/dL
HGB URINE DIPSTICK: NEGATIVE
Ketones, ur: NEGATIVE mg/dL
NITRITE: NEGATIVE
PH: 6 (ref 5.0–8.0)
Protein, ur: NEGATIVE mg/dL
SPECIFIC GRAVITY, URINE: 1.02 (ref 1.005–1.030)

## 2017-09-29 MED ORDER — HYDROCODONE-ACETAMINOPHEN 5-325 MG PO TABS
1.0000 | ORAL_TABLET | Freq: Once | ORAL | Status: AC
  Administered 2017-09-29: 1 via ORAL
  Filled 2017-09-29: qty 1

## 2017-09-29 MED ORDER — NAPROXEN 375 MG PO TABS: 375.0000 mg | ORAL_TABLET | Freq: Two times a day (BID) | ORAL | 0 refills | Status: DC

## 2017-09-29 MED ORDER — DEXAMETHASONE SODIUM PHOSPHATE 4 MG/ML IJ SOLN
8.0000 mg | Freq: Once | INTRAMUSCULAR | Status: AC
  Administered 2017-09-29: 8 mg via INTRAMUSCULAR
  Filled 2017-09-29: qty 2

## 2017-09-29 MED ORDER — HYDROCODONE-ACETAMINOPHEN 5-325 MG PO TABS: ORAL_TABLET | ORAL | 0 refills | Status: DC

## 2017-09-29 MED ORDER — PREDNISONE 10 MG PO TABS: ORAL_TABLET | ORAL | 0 refills | Status: DC

## 2017-09-29 NOTE — Discharge Instructions (Addendum)
Alternate ice and heat to your back.  Follow-up with your primary doctor or with Dr. Rolena Infante in 1 week if not improving.

## 2017-09-29 NOTE — ED Triage Notes (Signed)
Patient states that she has a degenerative problem in her back and it has been getting worse.  Was able to take tylenol with relief and now that is not helping.  Hurts to bend down.

## 2017-10-01 LAB — URINE CULTURE

## 2017-10-01 NOTE — ED Provider Notes (Signed)
[facility] EMERGENCY DEPARTMENT Provider Note   CSN: [id number] Arrival date & time: 09/29/17  1458     History   Chief Complaint Chief Complaint  Patient presents with  . Back Pain    Jamie Ewing is a 77 y.o. female.  Jamie   Jamie Ewing is a 77 y.o. female who presents to the Emergency Department complaining of low back pain for several days.  She reports hx of degenerative disk disease with similar symptoms, but pain worse for several days.  Taking tylenol with relief typically, but not helping for two days.  Pain to her back worse with bending, improves with rest.  She describes an aching pain across her lower back.  Radiates into right hip and thigh.  She denies injury, abdominal pain, urine or bowel changes, numbness or weakness of the lower extremities and fever.    Past Medical History:  Diagnosis Date  . Back pain   . Hypercholesteremia   . Hypertension     Patient Active Problem List   Diagnosis Date Noted  . Lipoma of axilla 05/31/2011    Past Surgical History:  Procedure Laterality Date  . TOTAL KNEE ARTHROPLASTY  2001     OB History   None      Home Medications    Prior to Admission medications   Medication Sig Start Date End Date Taking? Authorizing Provider  acetaminophen (TYLENOL) 650 MG CR tablet Take 1,300 mg by mouth every 8 (eight) hours as needed for pain.    [provider]  amLODipine (NORVASC) 5 MG tablet Take 5 mg by mouth daily.  05/13/11   [provider]  HYDROcodone-acetaminophen (NORCO/VICODIN) 5-325 MG tablet Take one tab po q 4 hrs prn pain 09/29/17   Helios Kohlmann, PA-C  metoprolol tartrate (LOPRESSOR) 25 MG tablet Take 25 mg by mouth daily.    [provider]  naproxen (NAPROSYN) 375 MG tablet Take 1 tablet (375 mg total) by mouth 2 (two) times daily. Take with food 09/29/17   Erving Sassano, PA-C  predniSONE (DELTASONE) 10 MG tablet 2 tabs daily for 4 days, then 1 tab daily for 4 days 09/29/17    Kem Parkinson, PA-C    Family History History reviewed. No pertinent family history.  Social History Social History   Tobacco Use  . Smoking status: Former Smoker    Types: Cigarettes    Last attempt to quit: 05/31/2006    Years since quitting: 11.3  . Smokeless tobacco: Never Used  Substance Use Topics  . Alcohol use: Yes    Comment: wine occassionally  . Drug use: No     Allergies   Patient has no known allergies.   Review of Systems Review of Systems  Constitutional: Negative for fever.  Respiratory: Negative for shortness of breath.   Gastrointestinal: Negative for abdominal pain, constipation and vomiting.  Genitourinary: Negative for decreased urine volume, difficulty urinating, dysuria, flank pain and hematuria.  Musculoskeletal: Positive for back pain. Negative for joint swelling.  Skin: Negative for rash.  Neurological: Negative for weakness and numbness.  All other systems reviewed and are negative.    Physical Exam Updated Vital Signs BP (!) 163/73   Pulse 83   Temp 98.5 F (36.9 C) (Oral)   Resp 17   Ht 5' 3.5" (1.613 m)   Wt 79.4 kg (175 lb)   SpO2 94%   BMI 30.51 kg/m   Physical Exam  Constitutional: She is oriented to person, place, and  time. She appears well-developed and well-nourished. No distress.  HENT:  Head: Normocephalic and atraumatic.  Neck: Normal range of motion. Neck supple.  Cardiovascular: Normal rate, regular rhythm and intact distal pulses.  DP pulses are strong and palpable bilaterally  Pulmonary/Chest: Effort normal and breath sounds normal. No respiratory distress.  Abdominal: Soft. She exhibits no distension. There is no tenderness.  Musculoskeletal: She exhibits tenderness. She exhibits no edema.       Lumbar back: She exhibits tenderness and pain. She exhibits normal range of motion, no swelling, no deformity, no laceration and normal pulse.  ttp of the lower lumbar spine and bilateral paraspinal muscles with  right greater than left.   Pt has 5/5 strength against resistance of bilateral lower extremities.  Hip flexors and extensors intact   Neurological: She is alert and oriented to person, place, and time. She has normal strength. No sensory deficit. She exhibits normal muscle tone. Coordination and gait normal.  Reflex Scores:      Patellar reflexes are 2+ on the right side and 2+ on the left side.      Achilles reflexes are 2+ on the right side and 2+ on the left side. Skin: Skin is warm and dry. Capillary refill takes less than 2 seconds. No rash noted.  Nursing note and vitals reviewed.    ED Treatments / Results  Labs (all labs ordered are listed, but only abnormal results are displayed) Labs Reviewed  URINE CULTURE - Abnormal; Notable for the following components:      Result Value   Culture   (*)    Value: <10,000 COLONIES/mL INSIGNIFICANT GROWTH Performed at [facility], [address] [place], [place] [address]    All other components within normal limits  URINALYSIS, ROUTINE W REFLEX MICROSCOPIC - Abnormal; Notable for the following components:   Leukocytes, UA MODERATE (*)    Bacteria, UA RARE (*)    All other components within normal limits    EKG None  Radiology No results found.  Procedures Procedures (including critical care time)  Medications Ordered in ED Medications  HYDROcodone-acetaminophen (NORCO/VICODIN) 5-325 MG per tablet 1 tablet (1 tablet Oral Given 09/29/17 1736)  dexamethasone (DECADRON) injection 8 mg (8 mg Intramuscular Given 09/29/17 1736)     Initial Impression / Assessment and Plan / ED Course  I have reviewed the triage vital signs and the nursing notes.  Pertinent labs & imaging results that were available during my care of the patient were reviewed by me and considered in my medical decision making (see chart for details).     Pt with likely acute on chronic low back pain.  Urine with leukocytes, asymptomatic.  Will culture  urine.  Clinical exam c/w musculoskeletal pain.  NV intact.  No concerning sx's fo racute neurological process.  Pt agrees to tx plan.  Feeling better after medication.  Ambulatory, gait steady.  Agrees to PCP f/u, return precautions discussed.   Final Clinical Impressions(s) / ED Diagnoses   Final diagnoses:  Acute bilateral low back pain with right-sided sciatica    ED Discharge Orders        Ordered    HYDROcodone-acetaminophen (NORCO/VICODIN) 5-325 MG tablet     09/29/17 1848    naproxen (NAPROSYN) 375 MG tablet  2 times daily     09/29/17 1848    predniSONE (DELTASONE) 10 MG tablet     09/29/17 1848       Kem Parkinson, PA-C 10/01/17 2334  Francine Graven, DO 10/04/17 [phone]

## 2017-10-03 DIAGNOSIS — M5441 Lumbago with sciatica, right side: Secondary | ICD-10-CM | POA: Diagnosis not present

## 2017-10-03 DIAGNOSIS — M2560 Stiffness of unspecified joint, not elsewhere classified: Secondary | ICD-10-CM | POA: Diagnosis not present

## 2017-10-03 DIAGNOSIS — R293 Abnormal posture: Secondary | ICD-10-CM | POA: Diagnosis not present

## 2017-10-03 DIAGNOSIS — M5136 Other intervertebral disc degeneration, lumbar region: Secondary | ICD-10-CM | POA: Diagnosis not present

## 2017-10-03 DIAGNOSIS — M545 Low back pain: Secondary | ICD-10-CM | POA: Diagnosis not present

## 2017-10-04 DIAGNOSIS — M545 Low back pain: Secondary | ICD-10-CM | POA: Diagnosis not present

## 2017-10-04 DIAGNOSIS — M5441 Lumbago with sciatica, right side: Secondary | ICD-10-CM | POA: Diagnosis not present

## 2017-10-04 DIAGNOSIS — R293 Abnormal posture: Secondary | ICD-10-CM | POA: Diagnosis not present

## 2017-10-04 DIAGNOSIS — M2560 Stiffness of unspecified joint, not elsewhere classified: Secondary | ICD-10-CM | POA: Diagnosis not present

## 2017-10-06 ENCOUNTER — Emergency Department (HOSPITAL_COMMUNITY): Payer: Medicare HMO | Admitting: Certified Registered Nurse Anesthetist

## 2017-10-06 ENCOUNTER — Encounter (HOSPITAL_COMMUNITY): Payer: Self-pay | Admitting: Emergency Medicine

## 2017-10-06 ENCOUNTER — Inpatient Hospital Stay (HOSPITAL_COMMUNITY)
Admission: EM | Admit: 2017-10-06 | Discharge: 2017-10-12 | DRG: 457 | Disposition: A | Discharge status: Skilled Nursing Facility | Payer: Medicare HMO | Attending: Neurosurgery | Admitting: Neurosurgery

## 2017-10-06 ENCOUNTER — Emergency Department (HOSPITAL_COMMUNITY)
Admission: EM | Admit: 2017-10-06 | Discharge: 2017-10-06 | Disposition: A | Discharge status: Home / Self Care | Payer: Medicare HMO | Source: Home / Self Care | Attending: Emergency Medicine | Admitting: Emergency Medicine

## 2017-10-06 ENCOUNTER — Encounter (HOSPITAL_COMMUNITY)
Admission: EM | Disposition: A | Discharge status: Skilled Nursing Facility | Payer: Self-pay | Source: Home / Self Care | Attending: Neurosurgery

## 2017-10-06 ENCOUNTER — Emergency Department (HOSPITAL_COMMUNITY): Payer: Medicare HMO

## 2017-10-06 ENCOUNTER — Other Ambulatory Visit: Payer: Self-pay

## 2017-10-06 ENCOUNTER — Inpatient Hospital Stay (HOSPITAL_COMMUNITY): Payer: Medicare HMO

## 2017-10-06 DIAGNOSIS — M4804 Spinal stenosis, thoracic region: Secondary | ICD-10-CM | POA: Diagnosis present

## 2017-10-06 DIAGNOSIS — C720 Malignant neoplasm of spinal cord: Secondary | ICD-10-CM | POA: Diagnosis not present

## 2017-10-06 DIAGNOSIS — M8448XA Pathological fracture, other site, initial encounter for fracture: Secondary | ICD-10-CM | POA: Diagnosis not present

## 2017-10-06 DIAGNOSIS — R200 Anesthesia of skin: Secondary | ICD-10-CM | POA: Diagnosis not present

## 2017-10-06 DIAGNOSIS — D4989 Neoplasm of unspecified behavior of other specified sites: Secondary | ICD-10-CM | POA: Diagnosis not present

## 2017-10-06 DIAGNOSIS — M8458XA Pathological fracture in neoplastic disease, other specified site, initial encounter for fracture: Secondary | ICD-10-CM | POA: Diagnosis not present

## 2017-10-06 DIAGNOSIS — G959 Disease of spinal cord, unspecified: Secondary | ICD-10-CM | POA: Diagnosis not present

## 2017-10-06 DIAGNOSIS — G9520 Unspecified cord compression: Secondary | ICD-10-CM | POA: Diagnosis not present

## 2017-10-06 DIAGNOSIS — S22079A Unspecified fracture of T9-T10 vertebra, initial encounter for closed fracture: Secondary | ICD-10-CM | POA: Diagnosis not present

## 2017-10-06 DIAGNOSIS — E785 Hyperlipidemia, unspecified: Secondary | ICD-10-CM | POA: Diagnosis present

## 2017-10-06 DIAGNOSIS — M899 Disorder of bone, unspecified: Secondary | ICD-10-CM | POA: Diagnosis not present

## 2017-10-06 DIAGNOSIS — E669 Obesity, unspecified: Secondary | ICD-10-CM | POA: Diagnosis present

## 2017-10-06 DIAGNOSIS — Z743 Need for continuous supervision: Secondary | ICD-10-CM | POA: Diagnosis not present

## 2017-10-06 DIAGNOSIS — C9000 Multiple myeloma not having achieved remission: Secondary | ICD-10-CM

## 2017-10-06 DIAGNOSIS — D497 Neoplasm of unspecified behavior of endocrine glands and other parts of nervous system: Secondary | ICD-10-CM | POA: Diagnosis not present

## 2017-10-06 DIAGNOSIS — R279 Unspecified lack of coordination: Secondary | ICD-10-CM | POA: Diagnosis not present

## 2017-10-06 DIAGNOSIS — E871 Hypo-osmolality and hyponatremia: Secondary | ICD-10-CM | POA: Diagnosis not present

## 2017-10-06 DIAGNOSIS — C9030 Solitary plasmacytoma not having achieved remission: Secondary | ICD-10-CM | POA: Diagnosis present

## 2017-10-06 DIAGNOSIS — Z79899 Other long term (current) drug therapy: Secondary | ICD-10-CM

## 2017-10-06 DIAGNOSIS — E8809 Other disorders of plasma-protein metabolism, not elsewhere classified: Secondary | ICD-10-CM | POA: Diagnosis not present

## 2017-10-06 DIAGNOSIS — Z6832 Body mass index (BMI) 32.0-32.9, adult: Secondary | ICD-10-CM

## 2017-10-06 DIAGNOSIS — M4854XA Collapsed vertebra, not elsewhere classified, thoracic region, initial encounter for fracture: Secondary | ICD-10-CM | POA: Diagnosis not present

## 2017-10-06 DIAGNOSIS — R531 Weakness: Secondary | ICD-10-CM | POA: Diagnosis not present

## 2017-10-06 DIAGNOSIS — R262 Difficulty in walking, not elsewhere classified: Secondary | ICD-10-CM | POA: Diagnosis not present

## 2017-10-06 DIAGNOSIS — E7800 Pure hypercholesterolemia, unspecified: Secondary | ICD-10-CM | POA: Diagnosis not present

## 2017-10-06 DIAGNOSIS — S22070S Wedge compression fracture of T9-T10 vertebra, sequela: Secondary | ICD-10-CM | POA: Diagnosis not present

## 2017-10-06 DIAGNOSIS — M546 Pain in thoracic spine: Secondary | ICD-10-CM | POA: Diagnosis not present

## 2017-10-06 DIAGNOSIS — R52 Pain, unspecified: Secondary | ICD-10-CM | POA: Diagnosis not present

## 2017-10-06 DIAGNOSIS — S22000A Wedge compression fracture of unspecified thoracic vertebra, initial encounter for closed fracture: Secondary | ICD-10-CM

## 2017-10-06 DIAGNOSIS — M62838 Other muscle spasm: Secondary | ICD-10-CM | POA: Diagnosis not present

## 2017-10-06 DIAGNOSIS — Z9889 Other specified postprocedural states: Secondary | ICD-10-CM

## 2017-10-06 DIAGNOSIS — G992 Myelopathy in diseases classified elsewhere: Secondary | ICD-10-CM | POA: Diagnosis not present

## 2017-10-06 DIAGNOSIS — D649 Anemia, unspecified: Secondary | ICD-10-CM | POA: Diagnosis not present

## 2017-10-06 DIAGNOSIS — Z87891 Personal history of nicotine dependence: Secondary | ICD-10-CM | POA: Diagnosis not present

## 2017-10-06 DIAGNOSIS — I10 Essential (primary) hypertension: Secondary | ICD-10-CM | POA: Diagnosis present

## 2017-10-06 DIAGNOSIS — Z4789 Encounter for other orthopedic aftercare: Secondary | ICD-10-CM | POA: Diagnosis not present

## 2017-10-06 DIAGNOSIS — D492 Neoplasm of unspecified behavior of bone, soft tissue, and skin: Secondary | ICD-10-CM | POA: Diagnosis present

## 2017-10-06 DIAGNOSIS — Z419 Encounter for procedure for purposes other than remedying health state, unspecified: Secondary | ICD-10-CM

## 2017-10-06 DIAGNOSIS — M4326 Fusion of spine, lumbar region: Secondary | ICD-10-CM | POA: Diagnosis not present

## 2017-10-06 DIAGNOSIS — D72829 Elevated white blood cell count, unspecified: Secondary | ICD-10-CM | POA: Diagnosis not present

## 2017-10-06 DIAGNOSIS — M6281 Muscle weakness (generalized): Secondary | ICD-10-CM | POA: Diagnosis not present

## 2017-10-06 DIAGNOSIS — M62830 Muscle spasm of back: Secondary | ICD-10-CM | POA: Insufficient documentation

## 2017-10-06 DIAGNOSIS — M5489 Other dorsalgia: Secondary | ICD-10-CM | POA: Diagnosis not present

## 2017-10-06 DIAGNOSIS — R110 Nausea: Secondary | ICD-10-CM | POA: Diagnosis not present

## 2017-10-06 DIAGNOSIS — S329XXA Fracture of unspecified parts of lumbosacral spine and pelvis, initial encounter for closed fracture: Secondary | ICD-10-CM | POA: Diagnosis not present

## 2017-10-06 HISTORY — PX: LAMINECTOMY: SHX219

## 2017-10-06 LAB — COMPREHENSIVE METABOLIC PANEL
ALT: 26 U/L (ref 0–44)
AST: 20 U/L (ref 15–41)
Albumin: 3.5 g/dL (ref 3.5–5.0)
Alkaline Phosphatase: 121 U/L (ref 38–126)
Anion gap: 12 (ref 5–15)
BUN: 19 mg/dL (ref 8–23)
CHLORIDE: 94 mmol/L — AB (ref 98–111)
CO2: 24 mmol/L (ref 22–32)
CREATININE: 0.77 mg/dL (ref 0.44–1.00)
Calcium: 9.7 mg/dL (ref 8.9–10.3)
Glucose, Bld: 122 mg/dL — ABNORMAL HIGH (ref 70–99)
Potassium: 3.8 mmol/L (ref 3.5–5.1)
Sodium: 130 mmol/L — ABNORMAL LOW (ref 135–145)
TOTAL PROTEIN: 9.9 g/dL — AB (ref 6.5–8.1)
Total Bilirubin: 0.5 mg/dL (ref 0.3–1.2)

## 2017-10-06 LAB — ABO/RH: ABO/RH(D): O POS

## 2017-10-06 LAB — URINALYSIS, ROUTINE W REFLEX MICROSCOPIC
Bilirubin Urine: NEGATIVE
GLUCOSE, UA: NEGATIVE mg/dL
HGB URINE DIPSTICK: NEGATIVE
Ketones, ur: NEGATIVE mg/dL
Leukocytes, UA: NEGATIVE
NITRITE: NEGATIVE
PH: 7 (ref 5.0–8.0)
PROTEIN: 30 mg/dL — AB
SPECIFIC GRAVITY, URINE: 1.015 (ref 1.005–1.030)

## 2017-10-06 LAB — CBC WITH DIFFERENTIAL/PLATELET
Basophils Absolute: 0 10*3/uL (ref 0.0–0.1)
Basophils Relative: 0 %
EOS PCT: 0 %
Eosinophils Absolute: 0 10*3/uL (ref 0.0–0.7)
HCT: 34.8 % — ABNORMAL LOW (ref 36.0–46.0)
Hemoglobin: 11.5 g/dL — ABNORMAL LOW (ref 12.0–15.0)
LYMPHS ABS: 2 10*3/uL (ref 0.7–4.0)
Lymphocytes Relative: 17 %
MCH: 31.9 pg (ref 26.0–34.0)
MCHC: 33 g/dL (ref 30.0–36.0)
MCV: 96.4 fL (ref 78.0–100.0)
MONO ABS: 1.2 10*3/uL — AB (ref 0.1–1.0)
Monocytes Relative: 10 %
Neutro Abs: 8.3 10*3/uL — ABNORMAL HIGH (ref 1.7–7.7)
Neutrophils Relative %: 73 %
PLATELETS: 281 10*3/uL (ref 150–400)
RBC: 3.61 MIL/uL — ABNORMAL LOW (ref 3.87–5.11)
RDW: 13.9 % (ref 11.5–15.5)
WBC: 11.5 10*3/uL — ABNORMAL HIGH (ref 4.0–10.5)

## 2017-10-06 LAB — GLUCOSE, CAPILLARY: GLUCOSE-CAPILLARY: 225 mg/dL — AB (ref 70–99)

## 2017-10-06 SURGERY — THORACIC LAMINECTOMY FOR TUMOR
Anesthesia: General | Site: Back

## 2017-10-06 MED ORDER — DIAZEPAM 5 MG PO TABS
10.0000 mg | ORAL_TABLET | Freq: Once | ORAL | Status: AC
  Administered 2017-10-06: 10 mg via ORAL
  Filled 2017-10-06: qty 2

## 2017-10-06 MED ORDER — HEMOSTATIC AGENTS (NO CHARGE) OPTIME
TOPICAL | Status: DC | PRN
  Administered 2017-10-06 (×3): 1 via TOPICAL

## 2017-10-06 MED ORDER — DEXAMETHASONE SODIUM PHOSPHATE 10 MG/ML IJ SOLN
INTRAMUSCULAR | Status: DC | PRN
  Administered 2017-10-06: 10 mg via INTRAVENOUS

## 2017-10-06 MED ORDER — FENTANYL CITRATE (PF) 100 MCG/2ML IJ SOLN: 25.0000 ug | INTRAMUSCULAR | Status: DC | PRN

## 2017-10-06 MED ORDER — HYDRALAZINE HCL 20 MG/ML IJ SOLN
10.0000 mg | INTRAMUSCULAR | Status: DC | PRN
  Administered 2017-10-06 – 2017-10-07 (×3): 10 mg via INTRAVENOUS
  Filled 2017-10-06 (×3): qty 1

## 2017-10-06 MED ORDER — BACITRACIN 50000 UNITS IM SOLR
INTRAMUSCULAR | Status: DC | PRN
  Administered 2017-10-06: 500 mL

## 2017-10-06 MED ORDER — PHENYLEPHRINE HCL 10 MG/ML IJ SOLN
INTRAMUSCULAR | Status: DC | PRN
  Administered 2017-10-06 (×5): 80 ug via INTRAVENOUS

## 2017-10-06 MED ORDER — DEXAMETHASONE SODIUM PHOSPHATE 4 MG/ML IJ SOLN
10.0000 mg | Freq: Once | INTRAMUSCULAR | Status: AC
  Administered 2017-10-06: 10 mg via INTRAVENOUS
  Filled 2017-10-06: qty 3

## 2017-10-06 MED ORDER — LIDOCAINE 2% (20 MG/ML) 5 ML SYRINGE
INTRAMUSCULAR | Status: DC | PRN
  Administered 2017-10-06: 60 mg via INTRAVENOUS

## 2017-10-06 MED ORDER — SODIUM CHLORIDE 0.9% FLUSH
3.0000 mL | INTRAVENOUS | Status: DC | PRN
Start: 2017-10-06 — End: 2017-10-12

## 2017-10-06 MED ORDER — BUPIVACAINE HCL (PF) 0.25 % IJ SOLN
INTRAMUSCULAR | Status: AC
  Filled 2017-10-06: qty 30

## 2017-10-06 MED ORDER — LIDOCAINE 2% (20 MG/ML) 5 ML SYRINGE
INTRAMUSCULAR | Status: AC
  Filled 2017-10-06: qty 5

## 2017-10-06 MED ORDER — MENTHOL 3 MG MT LOZG: 1.0000 | LOZENGE | OROMUCOSAL | Status: DC | PRN

## 2017-10-06 MED ORDER — ONDANSETRON HCL 4 MG/2ML IJ SOLN
INTRAMUSCULAR | Status: AC
  Filled 2017-10-06: qty 2

## 2017-10-06 MED ORDER — HYDROMORPHONE HCL 1 MG/ML IJ SOLN
0.5000 mg | Freq: Once | INTRAMUSCULAR | Status: AC
  Administered 2017-10-06: 0.5 mg via INTRAVENOUS
  Filled 2017-10-06: qty 1

## 2017-10-06 MED ORDER — HEMOSTATIC AGENTS (NO CHARGE) OPTIME
TOPICAL | Status: DC | PRN
  Administered 2017-10-06: 1 via TOPICAL

## 2017-10-06 MED ORDER — PROPOFOL 10 MG/ML IV BOLUS
INTRAVENOUS | Status: AC
  Filled 2017-10-06: qty 20

## 2017-10-06 MED ORDER — FENTANYL CITRATE (PF) 250 MCG/5ML IJ SOLN
INTRAMUSCULAR | Status: DC | PRN
  Administered 2017-10-06: 100 ug via INTRAVENOUS
  Administered 2017-10-06: 50 ug via INTRAVENOUS
  Administered 2017-10-06: 100 ug via INTRAVENOUS

## 2017-10-06 MED ORDER — OXYCODONE HCL 5 MG/5ML PO SOLN: 5.0000 mg | Freq: Once | ORAL | Status: DC | PRN

## 2017-10-06 MED ORDER — HYDROCHLOROTHIAZIDE 25 MG PO TABS
25.0000 mg | ORAL_TABLET | Freq: Every day | ORAL | Status: DC
  Administered 2017-10-07 – 2017-10-12 (×6): 25 mg via ORAL
  Filled 2017-10-06 (×6): qty 1

## 2017-10-06 MED ORDER — THROMBIN 5000 UNITS EX SOLR
CUTANEOUS | Status: AC
  Filled 2017-10-06: qty 20000

## 2017-10-06 MED ORDER — DIAZEPAM 5 MG PO TABS
5.0000 mg | ORAL_TABLET | Freq: Four times a day (QID) | ORAL | Status: DC | PRN
  Administered 2017-10-08 – 2017-10-12 (×4): 10 mg via ORAL
  Filled 2017-10-06 (×4): qty 2

## 2017-10-06 MED ORDER — AMLODIPINE BESYLATE 5 MG PO TABS
5.0000 mg | ORAL_TABLET | Freq: Every day | ORAL | Status: DC
  Administered 2017-10-07 – 2017-10-12 (×6): 5 mg via ORAL
  Filled 2017-10-06 (×6): qty 1

## 2017-10-06 MED ORDER — ACETAMINOPHEN 325 MG PO TABS
650.0000 mg | ORAL_TABLET | ORAL | Status: DC | PRN
  Administered 2017-10-09: 650 mg via ORAL
  Filled 2017-10-06: qty 2

## 2017-10-06 MED ORDER — ONDANSETRON HCL 4 MG/2ML IJ SOLN
INTRAMUSCULAR | Status: DC | PRN
  Administered 2017-10-06: 4 mg via INTRAVENOUS

## 2017-10-06 MED ORDER — SODIUM CHLORIDE 0.9% FLUSH
3.0000 mL | Freq: Two times a day (BID) | INTRAVENOUS | Status: DC
  Administered 2017-10-06 – 2017-10-12 (×10): 3 mL via INTRAVENOUS

## 2017-10-06 MED ORDER — CYCLOBENZAPRINE HCL 5 MG PO TABS: ORAL_TABLET | ORAL | 0 refills | Status: DC

## 2017-10-06 MED ORDER — SUGAMMADEX SODIUM 200 MG/2ML IV SOLN
INTRAVENOUS | Status: AC
  Filled 2017-10-06: qty 2

## 2017-10-06 MED ORDER — KETOROLAC TROMETHAMINE 30 MG/ML IJ SOLN
30.0000 mg | Freq: Once | INTRAMUSCULAR | Status: AC
  Administered 2017-10-06: 30 mg via INTRAMUSCULAR
  Filled 2017-10-06: qty 1

## 2017-10-06 MED ORDER — ONDANSETRON HCL 4 MG PO TABS: 4.0000 mg | ORAL_TABLET | Freq: Four times a day (QID) | ORAL | Status: DC | PRN

## 2017-10-06 MED ORDER — DEXAMETHASONE SODIUM PHOSPHATE 4 MG/ML IJ SOLN
4.0000 mg | Freq: Four times a day (QID) | INTRAMUSCULAR | Status: DC
  Administered 2017-10-06 – 2017-10-11 (×7): 4 mg via INTRAVENOUS
  Filled 2017-10-06 (×8): qty 1

## 2017-10-06 MED ORDER — 0.9 % SODIUM CHLORIDE (POUR BTL) OPTIME
TOPICAL | Status: DC | PRN
  Administered 2017-10-06: 1000 mL

## 2017-10-06 MED ORDER — ALBUMIN HUMAN 5 % IV SOLN
INTRAVENOUS | Status: DC | PRN
  Administered 2017-10-06: 20:00:00 via INTRAVENOUS

## 2017-10-06 MED ORDER — PHENYLEPHRINE 40 MCG/ML (10ML) SYRINGE FOR IV PUSH (FOR BLOOD PRESSURE SUPPORT)
PREFILLED_SYRINGE | INTRAVENOUS | Status: AC
  Filled 2017-10-06: qty 10

## 2017-10-06 MED ORDER — PROPOFOL 10 MG/ML IV BOLUS
INTRAVENOUS | Status: DC | PRN
  Administered 2017-10-06: 20 mg via INTRAVENOUS
  Administered 2017-10-06: 120 mg via INTRAVENOUS

## 2017-10-06 MED ORDER — OXYCODONE HCL 5 MG PO TABS: 10.0000 mg | ORAL_TABLET | ORAL | Status: DC | PRN

## 2017-10-06 MED ORDER — BISACODYL 10 MG RE SUPP
10.0000 mg | Freq: Every day | RECTAL | Status: DC | PRN
  Administered 2017-10-11: 10 mg via RECTAL
  Filled 2017-10-06: qty 1

## 2017-10-06 MED ORDER — VANCOMYCIN HCL 1000 MG IV SOLR
INTRAVENOUS | Status: AC
  Filled 2017-10-06: qty 1000

## 2017-10-06 MED ORDER — THROMBIN 5000 UNITS EX SOLR
CUTANEOUS | Status: DC | PRN
  Administered 2017-10-06 (×4): 5000 [IU] via TOPICAL

## 2017-10-06 MED ORDER — HYDROMORPHONE HCL 1 MG/ML IJ SOLN
1.0000 mg | INTRAMUSCULAR | Status: DC | PRN
  Administered 2017-10-07 – 2017-10-08 (×4): 1 mg via INTRAVENOUS
  Filled 2017-10-06 (×4): qty 1

## 2017-10-06 MED ORDER — ACETAMINOPHEN 650 MG RE SUPP: 650.0000 mg | RECTAL | Status: DC | PRN

## 2017-10-06 MED ORDER — ONDANSETRON HCL 4 MG/2ML IJ SOLN
4.0000 mg | Freq: Four times a day (QID) | INTRAMUSCULAR | Status: DC | PRN
  Administered 2017-10-06: 4 mg via INTRAVENOUS
  Filled 2017-10-06: qty 2

## 2017-10-06 MED ORDER — ONDANSETRON HCL 4 MG PO TABS: 4.0000 mg | ORAL_TABLET | Freq: Three times a day (TID) | ORAL | 0 refills | Status: DC | PRN

## 2017-10-06 MED ORDER — HYDRALAZINE HCL 20 MG/ML IJ SOLN
10.0000 mg | Freq: Once | INTRAMUSCULAR | Status: AC
  Administered 2017-10-06: 10 mg via INTRAVENOUS

## 2017-10-06 MED ORDER — GLYCOPYRROLATE 0.2 MG/ML IJ SOLN
INTRAMUSCULAR | Status: DC | PRN
  Administered 2017-10-06: 0.2 mg via INTRAVENOUS

## 2017-10-06 MED ORDER — PHENOL 1.4 % MT LIQD: 1.0000 | OROMUCOSAL | Status: DC | PRN

## 2017-10-06 MED ORDER — SODIUM CHLORIDE 0.9 % IV SOLN: 250.0000 mL | INTRAVENOUS | Status: DC

## 2017-10-06 MED ORDER — SUGAMMADEX SODIUM 200 MG/2ML IV SOLN
INTRAVENOUS | Status: DC | PRN
  Administered 2017-10-06: 200 mg via INTRAVENOUS

## 2017-10-06 MED ORDER — LACTATED RINGERS IV SOLN
INTRAVENOUS | Status: DC | PRN
  Administered 2017-10-06 (×3): via INTRAVENOUS

## 2017-10-06 MED ORDER — CEFAZOLIN SODIUM-DEXTROSE 2-4 GM/100ML-% IV SOLN
2.0000 g | INTRAVENOUS | Status: AC
  Administered 2017-10-06: 2 g via INTRAVENOUS
  Filled 2017-10-06: qty 100

## 2017-10-06 MED ORDER — HYDRALAZINE HCL 20 MG/ML IJ SOLN
INTRAMUSCULAR | Status: AC
  Administered 2017-10-06: 10 mg via INTRAVENOUS
  Filled 2017-10-06: qty 1

## 2017-10-06 MED ORDER — ONDANSETRON 8 MG PO TBDP
ORAL_TABLET | ORAL | Status: AC
  Filled 2017-10-06: qty 1

## 2017-10-06 MED ORDER — HYDROCODONE-ACETAMINOPHEN 10-325 MG PO TABS
1.0000 | ORAL_TABLET | ORAL | Status: DC | PRN
  Administered 2017-10-06 – 2017-10-11 (×11): 1 via ORAL
  Filled 2017-10-06 (×12): qty 1

## 2017-10-06 MED ORDER — ONDANSETRON HCL 4 MG PO TABS: 4.0000 mg | ORAL_TABLET | Freq: Three times a day (TID) | ORAL | Status: DC | PRN

## 2017-10-06 MED ORDER — ROCURONIUM BROMIDE 10 MG/ML (PF) SYRINGE
PREFILLED_SYRINGE | INTRAVENOUS | Status: DC | PRN
  Administered 2017-10-06: 50 mg via INTRAVENOUS

## 2017-10-06 MED ORDER — OXYCODONE HCL 5 MG PO TABS: 5.0000 mg | ORAL_TABLET | Freq: Once | ORAL | Status: DC | PRN

## 2017-10-06 MED ORDER — POLYETHYLENE GLYCOL 3350 17 G PO PACK
17.0000 g | PACK | Freq: Every day | ORAL | Status: DC | PRN
  Administered 2017-10-09: 17 g via ORAL
  Filled 2017-10-06: qty 1

## 2017-10-06 MED ORDER — CEFAZOLIN SODIUM-DEXTROSE 1-4 GM/50ML-% IV SOLN
1.0000 g | Freq: Three times a day (TID) | INTRAVENOUS | Status: AC
  Administered 2017-10-06 – 2017-10-07 (×2): 1 g via INTRAVENOUS
  Filled 2017-10-06 (×2): qty 50

## 2017-10-06 MED ORDER — FENTANYL CITRATE (PF) 250 MCG/5ML IJ SOLN
INTRAMUSCULAR | Status: AC
  Filled 2017-10-06: qty 5

## 2017-10-06 MED ORDER — VANCOMYCIN HCL 1000 MG IV SOLR
INTRAVENOUS | Status: DC | PRN
  Administered 2017-10-06: 1000 mg via TOPICAL

## 2017-10-06 MED ORDER — FLEET ENEMA 7-19 GM/118ML RE ENEM: 1.0000 | ENEMA | Freq: Once | RECTAL | Status: DC | PRN

## 2017-10-06 MED ORDER — DEXAMETHASONE 4 MG PO TABS
4.0000 mg | ORAL_TABLET | Freq: Four times a day (QID) | ORAL | Status: DC
  Administered 2017-10-07 – 2017-10-12 (×15): 4 mg via ORAL
  Filled 2017-10-06 (×16): qty 1

## 2017-10-06 MED ORDER — ROCURONIUM BROMIDE 10 MG/ML (PF) SYRINGE
PREFILLED_SYRINGE | INTRAVENOUS | Status: AC
  Filled 2017-10-06: qty 10

## 2017-10-06 MED ORDER — DEXAMETHASONE SODIUM PHOSPHATE 10 MG/ML IJ SOLN
INTRAMUSCULAR | Status: AC
  Filled 2017-10-06: qty 1

## 2017-10-06 MED ORDER — ONDANSETRON 8 MG PO TBDP
8.0000 mg | ORAL_TABLET | Freq: Once | ORAL | Status: AC
  Administered 2017-10-06: 8 mg via ORAL

## 2017-10-06 SURGICAL SUPPLY — 83 items
BAG DECANTER FOR FLEXI CONT (MISCELLANEOUS) ×3 IMPLANT
BENZOIN TINCTURE PRP APPL 2/3 (GAUZE/BANDAGES/DRESSINGS) ×3 IMPLANT
BLADE CLIPPER SURG (BLADE) IMPLANT
BLADE SURG 11 STRL SS (BLADE) IMPLANT
BUR CUTTER 7.0 ROUND (BURR) IMPLANT
BUR MATCHSTICK NEURO 3.0 LAGG (BURR) IMPLANT
CANISTER SUCT 3000ML PPV (MISCELLANEOUS) ×3 IMPLANT
CAP LCK SPNE (Orthopedic Implant) ×6 IMPLANT
CAP LOCK SPINE RADIUS (Orthopedic Implant) ×6 IMPLANT
CAP LOCKING (Orthopedic Implant) ×12 IMPLANT
CARTRIDGE OIL MAESTRO DRILL (MISCELLANEOUS) ×1 IMPLANT
CLOSURE STERI-STRIP 1/2X4 (GAUZE/BANDAGES/DRESSINGS) ×2
CLOSURE WOUND 1/2 X4 (GAUZE/BANDAGES/DRESSINGS) ×1
CLSR STERI-STRIP ANTIMIC 1/2X4 (GAUZE/BANDAGES/DRESSINGS) ×4 IMPLANT
CONNECT SPINAL 34MM CROSS FIX (Connector) ×3 IMPLANT
CONNECTOR SPINAL 34MM CRS FIX (Connector) ×1 IMPLANT
DERMABOND ADVANCED (GAUZE/BANDAGES/DRESSINGS) ×2
DERMABOND ADVANCED .7 DNX12 (GAUZE/BANDAGES/DRESSINGS) ×1 IMPLANT
DIFFUSER DRILL AIR PNEUMATIC (MISCELLANEOUS) ×3 IMPLANT
DRAPE LAPAROTOMY 100X72X124 (DRAPES) ×3 IMPLANT
DRAPE LAPAROTOMY T 102X78X121 (DRAPES) IMPLANT
DRAPE MICROSCOPE LEICA (MISCELLANEOUS) ×3 IMPLANT
DRAPE POUCH INSTRU U-SHP 10X18 (DRAPES) ×3 IMPLANT
DRAPE SURG 17X23 STRL (DRAPES) ×6 IMPLANT
DRSG OPSITE POSTOP 4X10 (GAUZE/BANDAGES/DRESSINGS) ×3 IMPLANT
ELECT REM PT RETURN 9FT ADLT (ELECTROSURGICAL) ×3
ELECTRODE REM PT RTRN 9FT ADLT (ELECTROSURGICAL) ×1 IMPLANT
EVACUATOR 1/8 PVC DRAIN (DRAIN) ×3 IMPLANT
GAUZE SPONGE 4X4 12PLY STRL (GAUZE/BANDAGES/DRESSINGS) ×3 IMPLANT
GAUZE SPONGE 4X4 16PLY XRAY LF (GAUZE/BANDAGES/DRESSINGS) IMPLANT
GLOVE BIO SURGEON STRL SZ 6.5 (GLOVE) ×2 IMPLANT
GLOVE BIO SURGEON STRL SZ7 (GLOVE) ×9 IMPLANT
GLOVE BIO SURGEONS STRL SZ 6.5 (GLOVE) ×1
GLOVE BIOGEL PI IND STRL 6.5 (GLOVE) ×1 IMPLANT
GLOVE BIOGEL PI IND STRL 7.5 (GLOVE) ×2 IMPLANT
GLOVE BIOGEL PI INDICATOR 6.5 (GLOVE) ×2
GLOVE BIOGEL PI INDICATOR 7.5 (GLOVE) ×4
GLOVE ECLIPSE 9.0 STRL (GLOVE) ×6 IMPLANT
GLOVE EXAM NITRILE LRG STRL (GLOVE) IMPLANT
GLOVE EXAM NITRILE XL STR (GLOVE) IMPLANT
GLOVE EXAM NITRILE XS STR PU (GLOVE) IMPLANT
GOWN STRL REUS W/ TWL LRG LVL3 (GOWN DISPOSABLE) ×3 IMPLANT
GOWN STRL REUS W/ TWL XL LVL3 (GOWN DISPOSABLE) IMPLANT
GOWN STRL REUS W/TWL 2XL LVL3 (GOWN DISPOSABLE) ×6 IMPLANT
GOWN STRL REUS W/TWL LRG LVL3 (GOWN DISPOSABLE) ×6
GOWN STRL REUS W/TWL XL LVL3 (GOWN DISPOSABLE)
GRAFT BN 10X1XDBM MAGNIFUSE (Bone Implant) ×2 IMPLANT
GRAFT BONE MAGNIFUSE 1X10CM (Bone Implant) ×4 IMPLANT
HEMOSTAT SURGICEL 2X14 (HEMOSTASIS) IMPLANT
KIT BASIN OR (CUSTOM PROCEDURE TRAY) ×3 IMPLANT
KIT TURNOVER KIT B (KITS) ×3 IMPLANT
NEEDLE HYPO 22GX1.5 SAFETY (NEEDLE) ×3 IMPLANT
NEEDLE HYPO 25X1 1.5 SAFETY (NEEDLE) ×3 IMPLANT
NEEDLE SPNL 20GX3.5 QUINCKE YW (NEEDLE) IMPLANT
NS IRRIG 1000ML POUR BTL (IV SOLUTION) ×3 IMPLANT
OIL CARTRIDGE MAESTRO DRILL (MISCELLANEOUS) ×3
PACK LAMINECTOMY NEURO (CUSTOM PROCEDURE TRAY) ×3 IMPLANT
PATTIES SURGICAL .5 X.5 (GAUZE/BANDAGES/DRESSINGS) IMPLANT
PATTIES SURGICAL .5 X3 (DISPOSABLE) ×3 IMPLANT
PATTIES SURGICAL 1X1 (DISPOSABLE) ×3 IMPLANT
ROD STRAIGHT 100MM (Rod) ×6 IMPLANT
RUBBERBAND STERILE (MISCELLANEOUS) ×6 IMPLANT
SCREW 5.75X45MM (Screw) ×12 IMPLANT
SCREW 6.75X45MM (Screw) ×6 IMPLANT
SPONGE LAP 4X18 RFD (DISPOSABLE) IMPLANT
SPONGE SURGIFOAM ABS GEL 100 (HEMOSTASIS) ×3 IMPLANT
STAPLER VISISTAT 35W (STAPLE) ×3 IMPLANT
STRIP CLOSURE SKIN 1/2X4 (GAUZE/BANDAGES/DRESSINGS) ×2 IMPLANT
SUT BONE WAX W31G (SUTURE) IMPLANT
SUT ETHILON 4 0 PS 2 18 (SUTURE) ×3 IMPLANT
SUT NURALON 4 0 TR CR/8 (SUTURE) IMPLANT
SUT PROLENE 6 0 BV (SUTURE) IMPLANT
SUT VIC AB 0 CT1 18XCR BRD8 (SUTURE) ×1 IMPLANT
SUT VIC AB 0 CT1 8-18 (SUTURE) ×2
SUT VIC AB 2-0 CT1 18 (SUTURE) ×3 IMPLANT
SUT VIC AB 3-0 SH 8-18 (SUTURE) ×3 IMPLANT
SUT VICRYL 4-0 PS2 18IN ABS (SUTURE) IMPLANT
TOWEL GREEN STERILE (TOWEL DISPOSABLE) ×3 IMPLANT
TOWEL GREEN STERILE FF (TOWEL DISPOSABLE) ×3 IMPLANT
TRAY FOLEY MTR SLVR 16FR STAT (SET/KITS/TRAYS/PACK) IMPLANT
TUBE CONNECTING 12'X1/4 (SUCTIONS)
TUBE CONNECTING 12X1/4 (SUCTIONS) IMPLANT
WATER STERILE IRR 1000ML POUR (IV SOLUTION) ×3 IMPLANT

## 2017-10-06 NOTE — ED Provider Notes (Signed)
[facility] EMERGENCY DEPARTMENT Provider Note   CSN: [id number] Arrival date & time: 10/06/17  0532  Time seen 06:05 AM   History   Chief Complaint Chief Complaint  Patient presents with  . Back Pain    HPI Jamie Ewing is a 77 y.o. female.  HPI patient states she has been having back pain since October.  She was seen in the ED on July 12 with low back pain.  She was given #8 hydrocodone and started on naproxen and prednisone.  She had a follow-up appointment at [facility] on July 16 and was told she had some problems between L3 and L5.  They continued her naproxen and started her on physical therapy.  She had physical therapy on the 15th and the 17th and is supposed to go today.  She states they have been mainly doing stretching exercises of her back.  However yesterday, July 18 she started having pain that she describes as spasms that come and go and get worse.  She denies that the pain radiates.  She states his pain is a little bit higher than her old pain.  She states walking, standing, and changing positions makes the pain worse, laying in her recliner on a heating pad helps.  She denies any urinary or rectal incontinence.  She is not having the pain radiate down her leg today.  She states she has 1 more prednisone 10 mg pill to take today and then it will be done.  She still is on naproxen.  PCP Asencion Noble, MD  Ortho Dr Rolena Infante  Past Medical History:  Diagnosis Date  . Back pain   . Hypercholesteremia   . Hypertension     Patient Active Problem List   Diagnosis Date Noted  . Lipoma of axilla 05/31/2011    Past Surgical History:  Procedure Laterality Date  . TOTAL KNEE ARTHROPLASTY  2001     OB History   None      Home Medications    Prior to Admission medications   Medication Sig Start Date End Date Taking? Authorizing Provider  acetaminophen (TYLENOL) 650 MG CR tablet Take 1,300 mg by mouth every 8 (eight) hours as needed for pain.    [provider]  amLODipine (NORVASC) 5 MG tablet Take 5 mg by mouth daily.  05/13/11   [provider]  cyclobenzaprine (FLEXERIL) 5 MG tablet Take 1 or 2 po Q 6hrs for muscle spasms 10/06/17   Rolland Porter, MD  HYDROcodone-acetaminophen (NORCO/VICODIN) 5-325 MG tablet Take one tab po q 4 hrs prn pain 09/29/17   Triplett, Tammy, PA-C  metoprolol tartrate (LOPRESSOR) 25 MG tablet Take 25 mg by mouth daily.    [provider]  naproxen (NAPROSYN) 375 MG tablet Take 1 tablet (375 mg total) by mouth 2 (two) times daily. Take with food 09/29/17   Triplett, Tammy, PA-C  predniSONE (DELTASONE) 10 MG tablet 2 tabs daily for 4 days, then 1 tab daily for 4 days 09/29/17   Kem Parkinson, PA-C    Family History History reviewed. No pertinent family history.  Social History Social History   Tobacco Use  . Smoking status: Former Smoker    Types: Cigarettes    Last attempt to quit: 05/31/2006    Years since quitting: 11.3  . Smokeless tobacco: Never Used  Substance Use Topics  . Alcohol use: Yes    Comment: wine occassionally  . Drug use: No  lives at home Lives alone   Allergies  Patient has no known allergies.   Review of Systems Review of Systems  All other systems reviewed and are negative.    Physical Exam Updated Vital Signs BP (!) 208/91 (BP Location: Left Arm)   Pulse 75   Temp 98.2 F (36.8 C) (Oral)   Resp 17   Ht 5\' 3"  (1.6 m)   Wt 77.1 kg (170 lb)   SpO2 97%   BMI 30.11 kg/m   Vital signs normal except for hypertension   Physical Exam  Constitutional: She is oriented to person, place, and time. She appears well-developed and well-nourished. She appears distressed.  HENT:  Head: Normocephalic and atraumatic.  Right Ear: External ear normal.  Left Ear: External ear normal.  Nose: Nose normal.  Eyes: Conjunctivae and EOM are normal.  Neck: Normal range of motion.  Cardiovascular: Normal rate.  Pulmonary/Chest: Effort normal. No respiratory distress.    Musculoskeletal:       Back:  Her thoracic spine and paraspinous muscles are nontender.  Her midline lumbar spine is nontender to palpation.  She has some tenderness of the paraspinous muscles bilaterally.  She is not tender over the sacrum or the sacroiliac joints bilaterally.  Straight leg raising is negative bilaterally, patellar reflexes are 2+ and equal bilaterally.  She indicates to me that her prior pain had been in the sacral area and over the SI joints.  Neurological: She is alert and oriented to person, place, and time. No cranial nerve deficit.  Skin: Skin is warm and dry. Capillary refill takes less than 2 seconds. No rash noted. No erythema.  Psychiatric: Thought content normal. Her mood appears anxious. Her speech is rapid and/or pressured. She is agitated.  Nursing note and vitals reviewed.    ED Treatments / Results  Labs (all labs ordered are listed, but only abnormal results are displayed) Labs Reviewed - No data to display  EKG None  Radiology No results found.  Procedures Procedures (including critical care time)  Medications Ordered in ED Medications  ketorolac (TORADOL) 30 MG/ML injection 30 mg (30 mg Intramuscular Given 10/06/17 0626)  diazepam (VALIUM) tablet 10 mg (10 mg Oral Given 10/06/17 0626)  ondansetron (ZOFRAN-ODT) disintegrating tablet 8 mg (8 mg Oral Given 10/06/17 2993)     Initial Impression / Assessment and Plan / ED Course  I have reviewed the triage vital signs and the nursing notes.  Pertinent labs & imaging results that were available during my care of the patient were reviewed by me and considered in my medical decision making (see chart for details).     Patient's pain appears to be along her paraspinous muscles of her lumbar spine.  She also describes the pain as a catching or grabbing pain like spasms.  Patient was given Toradol IM and oral Valium (nation wide shortage of IV Valium).  Nurse reports shortly after I walked out of the  room patient vomited.  She was given Zofran ODT.  6:35 AM patient reports improvement of her symptoms after given Toradol IM and Valium p.o.  She was discharged home with a muscle relaxer, she already has a anti-inflammatory, naproxen to take.  She also has just finished a prednisone Dosepak.  I suspect her muscle spasms are probably from the new activity with the physical therapy for her back.  Her original back pain is improved.  Review of the [place] shows patient only has 2 entries, #20 hydrocodone December 25, 2016 from our ER and #8 hydrocodone 5/325 mg on  July 12 from our ER.   Final Clinical Impressions(s) / ED Diagnoses   Final diagnoses:  Lumbar paraspinal muscle spasm    ED Discharge Orders        Ordered    cyclobenzaprine (FLEXERIL) 5 MG tablet     10/06/17 0640      Plan discharge  Rolland Porter, MD, Barbette Or, MD 10/06/17 [phone]

## 2017-10-06 NOTE — ED Provider Notes (Signed)
[facility] EMERGENCY DEPARTMENT Provider Note   CSN: [id number] Arrival date & time: 10/06/17  1112     History   Chief Complaint Chief Complaint  Patient presents with  . Numbness    HPI [name] is a 77 y.o. female.  HPI Patient was seen this morning for worsening of her low back pain.  States that since she is left the emergency department she has been unable to walk.  She complains of ongoing bilateral lower extremity numbness.  She denies any incontinence.  Her medications are not adequately treating her pain.  She has had nausea but no vomiting. Past Medical History:  Diagnosis Date  . Back pain   . Hypercholesteremia   . Hypertension     Patient Active Problem List   Diagnosis Date Noted  . Lipoma of axilla 05/31/2011    Past Surgical History:  Procedure Laterality Date  . TOTAL KNEE ARTHROPLASTY  2001     OB History   None      Home Medications    Prior to Admission medications   Medication Sig Start Date End Date Taking? Authorizing Provider  acetaminophen (TYLENOL) 650 MG CR tablet Take 1,300 mg by mouth every 8 (eight) hours as needed for pain.   Yes [provider]  amLODipine (NORVASC) 5 MG tablet Take 5 mg by mouth daily.  05/13/11  Yes [provider]  cyclobenzaprine (FLEXERIL) 5 MG tablet Take 1 or 2 po Q 6hrs for muscle spasms 10/06/17  Yes Rolland Porter, MD  hydrochlorothiazide (HYDRODIURIL) 25 MG tablet Take 25 mg by mouth daily.   Yes [provider]  naproxen (NAPROSYN) 500 MG tablet Take 500 mg by mouth 2 (two) times daily with a meal.   Yes [provider]  ondansetron (ZOFRAN) 4 MG tablet Take 1 tablet (4 mg total) by mouth every 8 (eight) hours as needed for nausea or vomiting. 10/06/17  Yes Rolland Porter, MD  predniSONE (DELTASONE) 10 MG tablet 2 tabs daily for 4 days, then 1 tab daily for 4 days 09/29/17  Yes Triplett, Tammy, PA-C  HYDROcodone-acetaminophen (NORCO/VICODIN) 5-325 MG tablet Take one tab  po q 4 hrs prn pain Patient not taking: Reported on 10/06/2017 09/29/17   Kem Parkinson, PA-C    Family History No family history on file.  Social History Social History   Tobacco Use  . Smoking status: Former Smoker    Types: Cigarettes    Last attempt to quit: 05/31/2006    Years since quitting: 11.3  . Smokeless tobacco: Never Used  Substance Use Topics  . Alcohol use: Yes    Comment: wine occassionally  . Drug use: No     Allergies   Patient has no known allergies.   Review of Systems Review of Systems  Constitutional: Negative for chills and fever.  Respiratory: Negative for cough and shortness of breath.   Cardiovascular: Negative for chest pain.  Gastrointestinal: Positive for nausea. Negative for abdominal pain, diarrhea and vomiting.  Genitourinary: Negative for difficulty urinating, flank pain and frequency.  Musculoskeletal: Positive for back pain and gait problem. Negative for neck pain.  Skin: Negative for rash and wound.  Neurological: Positive for weakness and numbness. Negative for dizziness, light-headedness and headaches.  All other systems reviewed and are negative.    Physical Exam Updated Vital Signs BP (!) 195/79   Pulse 90   Temp 98.2 F (36.8 C)   Resp 16   Ht _0  (1.6 m)  Wt 81.2 kg (179 lb)   SpO2 96%   BMI 31.71 kg/m   Physical Exam  Constitutional: She is oriented to person, place, and time. She appears well-developed and well-nourished.  HENT:  Head: Normocephalic and atraumatic.  Mouth/Throat: Oropharynx is clear and moist.  Eyes: Pupils are equal, round, and reactive to light. EOM are normal.  Neck: Normal range of motion. Neck supple.  Cardiovascular: Normal rate and regular rhythm. Exam reveals no gallop and no friction rub.  No murmur heard. Pulmonary/Chest: Effort normal and breath sounds normal. No stridor. No respiratory distress. She has no wheezes. She has no rales. She exhibits no tenderness.  Abdominal: Soft.  Bowel sounds are normal. There is no tenderness. There is no rebound and no guarding.  Musculoskeletal: Normal range of motion. She exhibits tenderness. She exhibits no edema.  Patient has superior lumbar midline tenderness to palpation.  No step-offs.  Negative straight leg raise bilaterally.  No lower extremity swelling, asymmetry or tenderness.  Patient has 2+ dorsalis pedis and posterior tibial pulses to the right foot.  1+ posterior tibial and dorsalis pedis pulse on the left.   Neurological: She is alert and oriented to person, place, and time.  2/5 motor bilateral lower extremities.  5/5 motor bilateral upper extremities.  Cranial nerves II through XII grossly intact.  Diminished sensation to bilateral lower extremities to light touch.  Skin: Skin is warm and dry. No rash noted. No erythema.  Psychiatric: She has a normal mood and affect. Her behavior is normal.  Nursing note and vitals reviewed.    ED Treatments / Results  Labs (all labs ordered are listed, but only abnormal results are displayed) Labs Reviewed  CBC WITH DIFFERENTIAL/PLATELET - Abnormal; Notable for the following components:      Result Value   WBC 11.5 (*)    RBC 3.61 (*)    Hemoglobin 11.5 (*)    HCT 34.8 (*)    Neutro Abs 8.3 (*)    Monocytes Absolute 1.2 (*)    All other components within normal limits  COMPREHENSIVE METABOLIC PANEL - Abnormal; Notable for the following components:   Sodium 130 (*)    Chloride 94 (*)    Glucose, Bld 122 (*)    Total Protein 9.9 (*)    All other components within normal limits  URINALYSIS, ROUTINE W REFLEX MICROSCOPIC - Abnormal; Notable for the following components:   APPearance HAZY (*)    Protein, ur 30 (*)    Bacteria, UA RARE (*)    All other components within normal limits    EKG None  Radiology Mr Lumbar Spine Wo Contrast  Result Date: 10/06/2017 CLINICAL DATA:  Rapidly progressive neurological deficit in the lower extremities. EXAM: MRI LUMBAR SPINE  WITHOUT CONTRAST TECHNIQUE: Multiplanar, multisequence MR imaging of the lumbar spine was performed. No intravenous contrast was administered. COMPARISON:  None. FINDINGS: Segmentation:  Standard. Alignment:  Physiologic. Vertebrae: No discitis or osteomyelitis. Diffuse abnormal T1 hypointense and T2 hyperintense lesions throughout the thoracolumbar spine and pelvis most consistent with malignancy, multiple myeloma versus metastatic disease. Abnormal bone lesion most prominent in the posterior aspect of the T10 vertebral body which extends into the posterior elements with a 9 x 15 x 44 mm posterior epidural soft tissue mass. Pathologic compression fracture of the T10 vertebral body with 6 mm of retropulsion and approximately 50% height loss. Severe multifactorial spinal stenosis and cord compression of the lower thoracic spinal cord resulting from the T10 compression fracture and retropulsion  in combination with the posterior epidural tumor. Conus medullaris and cauda equina: Conus extends to the T12 level. Conus and cauda equina appear normal. Paraspinal and other soft tissues: No acute paraspinal abnormality. Partially visualized right renal cystic mass. Disc levels: Disc spaces: Degenerative disc disease with disc desiccation at L4-5. T12-L1: No significant disc bulge. No evidence of neural foraminal stenosis. No central canal stenosis. L1-L2: No significant disc bulge. No evidence of neural foraminal stenosis. No central canal stenosis. L2-L3: Mild broad-based disc bulge. Mild bilateral facet arthropathy. No evidence of neural foraminal stenosis. No central canal stenosis. L3-L4: Mild broad-based disc bulge. Moderate bilateral facet arthropathy. No evidence of neural foraminal stenosis. No central canal stenosis. L4-L5: Broad-based disc bulge. Moderate bilateral facet arthropathy. No evidence of neural foraminal stenosis. No central canal stenosis. L5-S1: No significant disc bulge. No evidence of neural  foraminal stenosis. No central canal stenosis. Moderate bilateral facet arthropathy. IMPRESSION: 1. Abnormal bone lesion most prominent in the posterior aspect of the T10 vertebral body which extends into the posterior elements with a 9 x 15 x 44 mm posterior epidural soft tissue mass. Pathologic compression fracture of the T10 vertebral body with 6 mm of retropulsion. Severe multifactorial spinal stenosis and cord compression of the lower thoracic spinal cord resulting from the T10 compression fracture and retropulsion in combination with the posterior epidural tumor. 2. Abnormal bone lesions throughout the thoracolumbar spine and pelvis most consistent with malignancy, metastatic disease versus multiple myeloma. Critical Value/emergent results were called by telephone at the time of interpretation on 10/06/2017 at 3:30 pm to Dr. Julianne Rice , who verbally acknowledged these results. Electronically Signed   By: Kathreen Devoid   On: 10/06/2017 15:32    Procedures Procedures (including critical care time)  Medications Ordered in ED Medications  HYDROmorphone (DILAUDID) injection 0.5 mg (0.5 mg Intravenous Given 10/06/17 1430)  dexamethasone (DECADRON) injection 10 mg (10 mg Intravenous Given 10/06/17 1601)    CRITICAL CARE Performed by: Julianne Rice Total critical care time: 45 minutes Critical care time was exclusive of separately billable procedures and treating other patients. Critical care was necessary to treat or prevent imminent or life-threatening deterioration. Critical care was time spent personally by me on the following activities: development of treatment plan with patient and/or surrogate as well as nursing, discussions with consultants, evaluation of patient's response to treatment, examination of patient, obtaining history from patient or surrogate, ordering and performing treatments and interventions, ordering and review of laboratory studies, ordering and review of radiographic  studies, pulse oximetry and re-evaluation of patient's condition. Initial Impression / Assessment and Plan / ED Course  I have reviewed the triage vital signs and the nursing notes.  Pertinent labs & imaging results that were available during my care of the patient were reviewed by me and considered in my medical decision making (see chart for details).     Patient's pain significantly improved after medication.  MRI with evidence likely pathologic compression fracture and cord compression at T10.  Scattered metastatic disease likely.  Discussed with Dr. Trenton Gammon.  Recommends giving patient 10 mg of Decadron and transferring to the [facility] emergency department for emergent neurosurgical evaluation and likely operative treatment.  Discussed at length with family and patient the patient was drowsy.  Dr. Darl Householder, emergency department MD at [facility] is aware and will consult neurosurgery on patient arrival.  Final Clinical Impressions(s) / ED Diagnoses   Final diagnoses:  Thoracic compression fracture, closed, initial encounter [facility])    ED  Discharge Orders    None       Julianne Rice, MD 10/10/17 [phone]

## 2017-10-06 NOTE — Transfer of Care (Signed)
Immediate Anesthesia Transfer of Care Note  Patient: Jamie Ewing  Procedure(s) Performed: THORACIC NINE AND TEN LAMINECTOMY WITH RESECTION OF TUMOR, THORACIC EIGHT TO THORACIC ELEVEN FUSION WITH PEDICLE SCREW FIXATION (N/A Back)  Patient Location: PACU  Anesthesia Type:General  Level of Consciousness: sedated, drowsy, patient cooperative and responds to stimulation  Airway & Oxygen Therapy: Patient Spontanous Breathing and Patient connected to nasal cannula oxygen  Post-op Assessment: Report given to RN, Post -op Vital signs reviewed and stable and Patient moving all extremities X 4  Post vital signs: Reviewed and stable  Last Vitals:  Vitals Value Taken Time  BP 160/81 10/06/2017  9:33 PM  Temp 36.5 C 10/06/2017  9:32 PM  Pulse 80 10/06/2017  9:39 PM  Resp 16 10/06/2017  9:39 PM  SpO2 100 % 10/06/2017  9:39 PM  Vitals shown include unvalidated device data.  Last Pain:  Vitals:   10/06/17 2132  TempSrc:   PainSc: Asleep         Complications: No apparent anesthesia complications

## 2017-10-06 NOTE — ED Triage Notes (Signed)
Pt states that she was here earlier for back pain she states that she can not walk and her back is still hurting she did take 2 flexeril as prescribed PTA

## 2017-10-06 NOTE — Anesthesia Preprocedure Evaluation (Signed)
Anesthesia Evaluation  Patient identified by MRN, date of birth, ID band Patient awake    Reviewed: Allergy & Precautions, H&P , NPO status , Patient's Chart, lab work & pertinent test results  Airway Mallampati: II   Neck ROM: full    Dental   Pulmonary former smoker,    breath sounds clear to auscultation       Cardiovascular hypertension,  Rhythm:regular Rate:Normal     Neuro/Psych    GI/Hepatic   Endo/Other  obese  Renal/GU      Musculoskeletal Thoracic vertebral fx with lower extremity weakness   Abdominal   Peds  Hematology   Anesthesia Other Findings   Reproductive/Obstetrics                             Anesthesia Physical Anesthesia Plan  ASA: II and emergent  Anesthesia Plan: General   Post-op Pain Management:    Induction: Intravenous  PONV Risk Score and Plan: 3 and Ondansetron, Dexamethasone, Midazolam and Treatment may vary due to age or medical condition  Airway Management Planned: Oral ETT  Additional Equipment:   Intra-op Plan:   Post-operative Plan: Extubation in OR  Informed Consent: I have reviewed the patients History and Physical, chart, labs and discussed the procedure including the risks, benefits and alternatives for the proposed anesthesia with the patient or authorized representative who has indicated his/her understanding and acceptance.     Plan Discussed with: CRNA, Anesthesiologist and Surgeon  Anesthesia Plan Comments:         Anesthesia Quick Evaluation

## 2017-10-06 NOTE — ED Notes (Signed)
MRI here to transport patient to MRI.

## 2017-10-06 NOTE — ED Triage Notes (Signed)
Per ems, pt c/o lower back pain that started around midnight. States shes been getting therapy on back.

## 2017-10-06 NOTE — Anesthesia Procedure Notes (Signed)
Procedure Name: Intubation Date/Time: 10/06/2017 6:52 PM Performed by: Claris Che, CRNA Pre-anesthesia Checklist: Patient identified, Emergency Drugs available, Suction available, Patient being monitored and Timeout performed Patient Re-evaluated:Patient Re-evaluated prior to induction Oxygen Delivery Method: Circle system utilized Preoxygenation: Pre-oxygenation with 100% oxygen Induction Type: IV induction Ventilation: Mask ventilation without difficulty Laryngoscope Size: Mac and 3 Grade View: Grade II Tube type: Oral Tube size: 7.0 mm Number of attempts: 1 Airway Equipment and Method: Stylet Placement Confirmation: ETT inserted through vocal cords under direct vision,  positive ETCO2 and breath sounds checked- equal and bilateral Secured at: 22 cm Tube secured with: Tape Dental Injury: Teeth and Oropharynx as per pre-operative assessment

## 2017-10-06 NOTE — Op Note (Addendum)
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  Asst. Surgeon: None  Anesthesia: General  Indication: 77 year old female with progressive back pain and rapid onset bilateral lower extremity numbness and weakness.  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  Patient presents now for emergent decompression and fusion surgery in hopes of improving her symptoms.  Operative note: After induction of anesthesia, patient position prone onto bolsters and she was appropriately padded.  Thoracic and lumbar spine prepped and draped sterilely.  Incision made from T8-T11.  Dissection performed bilaterally.  Retractor placed.  Fluoroscopy used.  Levels confirmed.  The tumor was involving the posterior elements of T10.  The spinous process and lamina of T10 were resected.  The facets of T10 were mostly resected.  The tumor itself involved both pedicles and both pedicles were resected.  A transpedicular decompression of T10 was then carried out by evacuating tumor from within the pedicles and extending down into the vertebral body to clean out tumor further.  The tumor itself extended dorsally to T9 and a partial T9 laminectomy was also performed.  At this point a very thorough decompression of the spinal canal had been achieved.  There was no evidence of injury to the thecal sac nerve roots or spinal cord.  Gelfoam was placed topically and attention was then placed to the stabilization.  Pedicles of T8 and  T9 were identified bilaterally and the pedicles of T11 were identified bilaterally.  Pilot holes were drilled under fluoroscopic guidance.  Pilot holes were then probed using a pedicle awl.  Each pedicle all track was probed and found to be solidly within the bone.  Each pedicle all track was then tapped.  [employer] medical radius bran screws were then placed bilaterally at T8 and T9 bilaterally and it Teoh 11 bilaterally.  Screws were confirmed to be in good position with follow-up AP and lateral fluoroscopic images.  Wound was then irrigated fanlike solution.  Short segment titanium rod was then placed of the screw heads at T8-T9 and T11.  Locking caps were then placed over the screws.  Locking caps were then engaged.  Transverse connector was placed.  Gelfoam was placed in the laminectomy defect.  Bone was decorticated these high-speed drill.  Magna fuse allograft packets from [employer] were then packed along the decorticated bone.  Vancomycin powder was placed in the deep wound space.  Wounds and closed in layers of Vicryl sutures.  Steri-Strips and sterile dressing were applied.  There were no apparent complications.  Patient tolerated the procedure well and she returns to the recovery room postop.  Addendum: Grossly tumor most consistent with plasmacytoma.  Tumor specimen sent to pathology for permanent studies.  No frozen diagnosis obtained.

## 2017-10-06 NOTE — Brief Op Note (Signed)
10/06/2017  9:10 PM  PATIENT:  Jamie Ewing  77 y.o. female  PRE-OPERATIVE DIAGNOSIS:  Thoracic-ten fracture with spinal stenosis  POST-OPERATIVE DIAGNOSIS:  Thoracic-ten fracture with spinal stenosis  PROCEDURE:  Procedure(s): THORACIC NINE AND TEN LAMINECTOMY WITH RESECTION OF TUMOR, THORACIC EIGHT TO THORACIC ELEVEN FUSION WITH PEDICLE SCREW FIXATION (N/A)  SURGEON:  Surgeon(s) and Role:    * Earnie Larsson, MD - Primary  PHYSICIAN ASSISTANT:   ASSISTANTS:    ANESTHESIA:   general  EBL:  400 mL   BLOOD ADMINISTERED:none  DRAINS: (med) Hemovact drain(s) in the epidural space with  Suction Open   LOCAL MEDICATIONS USED:  MARCAINE     SPECIMEN:  Source of Specimen:  t10 epidural tumor  DISPOSITION OF SPECIMEN:  PATHOLOGY  COUNTS:  YES  TOURNIQUET:  * No tourniquets in log *  DICTATION: .Dragon Dictation  PLAN OF CARE: Admit to inpatient   PATIENT DISPOSITION:  PACU - hemodynamically stable.   Delay start of Pharmacological VTE agent (>24hrs) due to surgical blood loss or risk of bleeding: yes

## 2017-10-06 NOTE — Discharge Instructions (Signed)
Use ice and heat for comfort. Take the medications as prescribed. Follow up at [facility].

## 2017-10-06 NOTE — H&P (Signed)
Jamie Ewing is an 77 y.o. female.   Chief Complaint: Back pain and weakness HPI: 77-year-old female with a one-month history of lower thoracic pain.  Symptoms have become progressively more severe over the past few days.  Patient with acute onset of weakness and sensory loss over the last 12 hours.  Patient states she is now unable to stand or walk secondary to pain and weakness.  Patient has no known history of malignancy.  She has no other ongoing major medical problems.  With currently she reports normal function in both upper extremities.  She notes weakness and sensory loss in both lower extremities right worse than left.  She has a Foley catheter in place which she cannot feel.  MRI scan demonstrates evidence of a pathologic compression fracture of T10 with some mild retropulsion.  Bony involvement of the tumor with replacement of the bone with obvious tumor circumferentially at the T10 level most consistent with plasmacytoma/multiple myeloma.  Severe spinal cord compression is present.  No other areas of fracture or obvious stenosis.  Past Medical History:  Diagnosis Date  . Back pain   . Hypercholesteremia   . Hypertension     Past Surgical History:  Procedure Laterality Date  . TOTAL KNEE ARTHROPLASTY  2001    No family history on file. Social History:  reports that she quit smoking about 11 years ago. Her smoking use included cigarettes. She has never used smokeless tobacco. She reports that she drinks alcohol. She reports that she does not use drugs.  Allergies: No Known Allergies   (Not in a hospital admission)  Results for orders placed or performed during the hospital encounter of 10/06/17 (from the past 48 hour(s))  CBC with Differential/Platelet     Status: Abnormal   Collection Time: 10/06/17  1:52 PM  Result Value Ref Range   WBC 11.5 (H) 4.0 - 10.5 K/uL   RBC 3.61 (L) 3.87 - 5.11 MIL/uL   Hemoglobin 11.5 (L) 12.0 - 15.0 g/dL   HCT 34.8 (L) 36.0 - 46.0 %   MCV  96.4 78.0 - 100.0 fL   MCH 31.9 26.0 - 34.0 pg   MCHC 33.0 30.0 - 36.0 g/dL   RDW 13.9 11.5 - 15.5 %   Platelets 281 150 - 400 K/uL   Neutrophils Relative % 73 %   Neutro Abs 8.3 (H) 1.7 - 7.7 K/uL   Lymphocytes Relative 17 %   Lymphs Abs 2.0 0.7 - 4.0 K/uL   Monocytes Relative 10 %   Monocytes Absolute 1.2 (H) 0.1 - 1.0 K/uL   Eosinophils Relative 0 %   Eosinophils Absolute 0.0 0.0 - 0.7 K/uL   Basophils Relative 0 %   Basophils Absolute 0.0 0.0 - 0.1 K/uL    Comment: Performed at [facility], [address] [place], [place] [address]  Comprehensive metabolic panel     Status: Abnormal   Collection Time: 10/06/17  1:52 PM  Result Value Ref Range   Sodium 130 (L) 135 - 145 mmol/L   Potassium 3.8 3.5 - 5.1 mmol/L   Chloride 94 (L) 98 - 111 mmol/L    Comment: Please note change in reference range.   CO2 24 22 - 32 mmol/L   Glucose, Bld 122 (H) 70 - 99 mg/dL    Comment: Please note change in reference range.   BUN 19 8 - 23 mg/dL    Comment: Please note change in reference range.   Creatinine, Ser 0.77 0.44 - 1.00 mg/dL     Calcium 9.7 8.9 - 10.3 mg/dL   Total Protein 9.9 (H) 6.5 - 8.1 g/dL   Albumin 3.5 3.5 - 5.0 g/dL   AST 20 15 - 41 U/L   ALT 26 0 - 44 U/L    Comment: Please note change in reference range.   Alkaline Phosphatase 121 38 - 126 U/L   Total Bilirubin 0.5 0.3 - 1.2 mg/dL   GFR calc non Af Amer >60 >60 mL/min   GFR calc Af Amer >60 >60 mL/min    Comment: (NOTE) The eGFR has been calculated using the CKD EPI equation. This calculation has not been validated in all clinical situations. eGFR's persistently <60 mL/min signify possible Chronic Kidney Disease.    Anion gap 12 5 - 15    Comment: Performed at [facility], [address] [place], [place] [address]  Urinalysis, Routine w reflex microscopic     Status: Abnormal   Collection Time: 10/06/17  2:40 PM  Result Value Ref Range   Color, Urine YELLOW YELLOW   APPearance HAZY (A) CLEAR   Specific Gravity,  Urine 1.015 1.005 - 1.030   pH 7.0 5.0 - 8.0   Glucose, UA NEGATIVE NEGATIVE mg/dL   Hgb urine dipstick NEGATIVE NEGATIVE   Bilirubin Urine NEGATIVE NEGATIVE   Ketones, ur NEGATIVE NEGATIVE mg/dL   Protein, ur 30 (A) NEGATIVE mg/dL   Nitrite NEGATIVE NEGATIVE   Leukocytes, UA NEGATIVE NEGATIVE   RBC / HPF 0-5 0 - 5 RBC/hpf   WBC, UA 0-5 0 - 5 WBC/hpf   Bacteria, UA RARE (A) NONE SEEN   Budding Yeast PRESENT     Comment: Performed at [facility], [address] [place], [place] [address]   Mr Lumbar Spine Wo Contrast  Result Date: 10/06/2017 CLINICAL DATA:  Rapidly progressive neurological deficit in the lower extremities. EXAM: MRI LUMBAR SPINE WITHOUT CONTRAST TECHNIQUE: Multiplanar, multisequence MR imaging of the lumbar spine was performed. No intravenous contrast was administered. COMPARISON:  None. FINDINGS: Segmentation:  Standard. Alignment:  Physiologic. Vertebrae: No discitis or osteomyelitis. Diffuse abnormal T1 hypointense and T2 hyperintense lesions throughout the thoracolumbar spine and pelvis most consistent with malignancy, multiple myeloma versus metastatic disease. Abnormal bone lesion most prominent in the posterior aspect of the T10 vertebral body which extends into the posterior elements with a 9 x 15 x 44 mm posterior epidural soft tissue mass. Pathologic compression fracture of the T10 vertebral body with 6 mm of retropulsion and approximately 50% height loss. Severe multifactorial spinal stenosis and cord compression of the lower thoracic spinal cord resulting from the T10 compression fracture and retropulsion in combination with the posterior epidural tumor. Conus medullaris and cauda equina: Conus extends to the T12 level. Conus and cauda equina appear normal. Paraspinal and other soft tissues: No acute paraspinal abnormality. Partially visualized right renal cystic mass. Disc levels: Disc spaces: Degenerative disc disease with disc desiccation at L4-5. T12-L1: No  significant disc bulge. No evidence of neural foraminal stenosis. No central canal stenosis. L1-L2: No significant disc bulge. No evidence of neural foraminal stenosis. No central canal stenosis. L2-L3: Mild broad-based disc bulge. Mild bilateral facet arthropathy. No evidence of neural foraminal stenosis. No central canal stenosis. L3-L4: Mild broad-based disc bulge. Moderate bilateral facet arthropathy. No evidence of neural foraminal stenosis. No central canal stenosis. L4-L5: Broad-based disc bulge. Moderate bilateral facet arthropathy. No evidence of neural foraminal stenosis. No central canal stenosis. L5-S1: No significant disc bulge. No evidence of neural foraminal stenosis. No central canal stenosis.  Moderate bilateral facet arthropathy. IMPRESSION: 1. Abnormal bone lesion most prominent in the posterior aspect of the T10 vertebral body which extends into the posterior elements with a 9 x 15 x 44 mm posterior epidural soft tissue mass. Pathologic compression fracture of the T10 vertebral body with 6 mm of retropulsion. Severe multifactorial spinal stenosis and cord compression of the lower thoracic spinal cord resulting from the T10 compression fracture and retropulsion in combination with the posterior epidural tumor. 2. Abnormal bone lesions throughout the thoracolumbar spine and pelvis most consistent with malignancy, metastatic disease versus multiple myeloma. Critical Value/emergent results were called by telephone at the time of interpretation on 10/06/2017 at 3:30 pm to Dr. Julianne Rice , who verbally acknowledged these results. Electronically Signed   By: Kathreen Devoid   On: 10/06/2017 15:32    Pertinent items noted in HPI and remainder of comprehensive ROS otherwise negative.  Blood pressure (!) 192/79, pulse 90, temperature 98.5 F (36.9 C), temperature source Oral, resp. rate 16, height 5' 3" (1.6 m), weight 81.2 kg (179 lb), SpO2 96 %.  Patient is awake and alert.  She is oriented and  appropriate.  She is obviously uncomfortable.  Speech is fluent.  Judgment and insight are intact.  Cranial nerve function normal bilaterally.  Motor examination 5/5 bilateral upper extremities.  Motor examination her lower extremities reveals 3/5 weakness in her left lower extremity and 2/5 weakness in her right lower extremity.  Sensory examination reveals a relative sensory level around the T11-T12 region.  Decreased sacral sensation.  Examination of her chest and abdomen are benign.  Reflexes are absent in both lower extremities.  Extremities are free from injury or deformity. Assessment/Plan T10 pathologic fracture with severe spinal stenosis secondary to neoplastic disease.  Plan emergent T10 laminectomy with T9-T11 posterior lateral arthrodesis utilizing his pedicle screw fixation and morselized allograft.  I discussed the risks and benefits with the patient and her family.  They wish to proceed.  Jamie Ewing 10/06/2017, 5:15 PM

## 2017-10-06 NOTE — Progress Notes (Signed)
Pt BP 202/78 Dr. Ermalene Postin notified and med ordered. Dr. Annette Stable at bedside at 2205 and aware of bp and med

## 2017-10-07 LAB — GLUCOSE, CAPILLARY
Glucose-Capillary: 145 mg/dL — ABNORMAL HIGH (ref 70–99)
Glucose-Capillary: 134 mg/dL — ABNORMAL HIGH (ref 70–99)
Glucose-Capillary: 135 mg/dL — ABNORMAL HIGH (ref 70–99)
Glucose-Capillary: 145 mg/dL — ABNORMAL HIGH (ref 70–99)

## 2017-10-07 LAB — BASIC METABOLIC PANEL
Anion gap: 16 — ABNORMAL HIGH (ref 5–15)
BUN: 14 mg/dL (ref 8–23)
CHLORIDE: 93 mmol/L — AB (ref 98–111)
CO2: 20 mmol/L — AB (ref 22–32)
Calcium: 9.6 mg/dL (ref 8.9–10.3)
Creatinine, Ser: 0.88 mg/dL (ref 0.44–1.00)
GFR calc non Af Amer: >60 mL/min (ref >60)
Glucose, Bld: 207 mg/dL — ABNORMAL HIGH (ref 70–99)
Potassium: 3.9 mmol/L (ref 3.5–5.1)
Sodium: 129 mmol/L — ABNORMAL LOW (ref 135–145)

## 2017-10-07 LAB — CBC
HEMATOCRIT: 29.9 % — AB (ref 36.0–46.0)
Hemoglobin: 9.8 g/dL — ABNORMAL LOW (ref 12.0–15.0)
MCH: 31.4 pg (ref 26.0–34.0)
MCHC: 32.8 g/dL (ref 30.0–36.0)
MCV: 95.8 fL (ref 78.0–100.0)
Platelets: 266 10*3/uL (ref 150–400)
RBC: 3.12 MIL/uL — AB (ref 3.87–5.11)
RDW: 13.9 % (ref 11.5–15.5)
WBC: 18.5 10*3/uL — AB (ref 4.0–10.5)

## 2017-10-07 LAB — TYPE AND SCREEN
ABO/RH(D): O POS
ANTIBODY SCREEN: NEGATIVE

## 2017-10-07 LAB — MRSA PCR SCREENING: MRSA by PCR: NEGATIVE

## 2017-10-07 MED ORDER — INSULIN ASPART 100 UNIT/ML ~~LOC~~ SOLN
0.0000 [IU] | Freq: Three times a day (TID) | SUBCUTANEOUS | Status: DC
  Administered 2017-10-07 – 2017-10-11 (×12): 2 [IU] via SUBCUTANEOUS
  Administered 2017-10-12: 3 [IU] via SUBCUTANEOUS
  Administered 2017-10-12: 2 [IU] via SUBCUTANEOUS

## 2017-10-07 MED ORDER — METOPROLOL TARTRATE 25 MG PO TABS
25.0000 mg | ORAL_TABLET | Freq: Two times a day (BID) | ORAL | Status: DC
  Administered 2017-10-07 – 2017-10-12 (×10): 25 mg via ORAL
  Filled 2017-10-07 (×10): qty 1

## 2017-10-07 NOTE — Evaluation (Signed)
Physical Therapy Evaluation Patient Details Name: Jamie Ewing MRN: [id number] DOB: 1940-10-17 Today's Date: 10/07/2017   History of Present Illness  77 year old female with progressive back pain and rapid onset bilateral lower extremity numbness and weakness.  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  Patient presents now for emergent decompression and fusion surgery   Clinical Impression  Pt admitted with above diagnosis. Pt currently with functional limitations due to the deficits listed below (see PT Problem List). Pt with B dorsiflexion and R knee extension 3/5 but R knee buckling in standing with decreased coordination. Pt also limited by pain and dizziness in standing today. Ambulated 3' to chair with RW and min A.  Pt will benefit from skilled PT to increase their independence and safety with mobility to allow discharge to the venue listed below.       Follow Up Recommendations SNF;Supervision for mobility/OOB    Equipment Recommendations  Rolling walker with 5" wheels    Recommendations for Other Services       Precautions / Restrictions Precautions Precautions: Fall;Back Precaution Booklet Issued: Yes (comment) Precaution Comments: reviewed precautions Required Braces or Orthoses: Spinal Brace Spinal Brace: Thoracolumbosacral orthotic;Applied in sitting position Restrictions Weight Bearing Restrictions: No      Mobility  Bed Mobility Overal bed mobility: Needs Assistance Bed Mobility: Rolling;Sidelying to Sit Rolling: Min assist Sidelying to sit: Min assist       General bed mobility comments: min A for LE's off EOB and trunk elevation  Transfers Overall transfer level: Needs assistance Equipment used: Rolling walker (2 wheeled) Transfers: Sit to/from Stand Sit to Stand: Min assist         General transfer comment: min A for power up and pt needed to press on RW  to stand so RW stabilized  Ambulation/Gait Ambulation/Gait assistance: Herbalist (Feet): 3 Feet Assistive device: Rolling walker (2 wheeled) Gait Pattern/deviations: Step-to pattern;Decreased weight shift to right;Decreased stride length Gait velocity: decreased Gait velocity interpretation: <1.31 ft/sec, indicative of household ambulator General Gait Details: R knee buckling in stance and pt dizzy  Stairs            Wheelchair Mobility    Modified Rankin (Stroke Patients Only)       Balance Overall balance assessment: Needs assistance Sitting-balance support: Bilateral upper extremity supported Sitting balance-Leahy Scale: Poor Sitting balance - Comments: pt needed UE support due to pain   Standing balance support: Bilateral upper extremity supported Standing balance-Leahy Scale: Poor Standing balance comment: unable to stand without support of RW                             Pertinent Vitals/Pain Pain Assessment: 0-10 Pain Score: 8  Pain Location: back Pain Descriptors / Indicators: Aching;Operative site guarding Pain Intervention(s): Limited activity within patient's tolerance;Monitored during session    Home Living Family/patient expects to be discharged to:: Private residence Living Arrangements: Alone Available Help at Discharge: Family;Available 24 hours/day Type of Home: Apartment Home Access: Level entry     Home Layout: One level Home Equipment: None Additional Comments: pt lives alone but reports she is discussing going home with family or someone coming home with her, did not seem like it would be a problem    Prior Function Level of Independence: Independent         Comments: was working at [employer] as a Scientist, water quality until  a weeka go     Hand Dominance        Extremity/Trunk Assessment   Upper Extremity Assessment Upper Extremity Assessment: Overall WFL for tasks assessed    Lower Extremity Assessment Lower  Extremity Assessment: RLE deficits/detail RLE Deficits / Details: knee instability in standing, knee ext 3/5, ankle WFL RLE Sensation: decreased proprioception RLE Coordination: decreased fine motor;decreased gross motor    Cervical / Trunk Assessment Cervical / Trunk Assessment: Normal  Communication   Communication: No difficulties  Cognition Arousal/Alertness: Lethargic;Suspect due to medications Behavior During Therapy: [facility] for tasks assessed/performed Overall Cognitive Status: Within Functional Limits for tasks assessed                                        General Comments General comments (skin integrity, edema, etc.): BP 155/59    Exercises General Exercises - Lower Extremity Ankle Circles/Pumps: AROM;Both;20 reps;Supine   Assessment/Plan    PT Assessment Patient needs continued PT services  PT Problem List Decreased strength;Decreased activity tolerance;Decreased balance;Decreased mobility;Decreased coordination;Decreased knowledge of use of DME;Decreased knowledge of precautions;Pain       PT Treatment Interventions DME instruction;Gait training;Functional mobility training;Therapeutic activities;Therapeutic exercise;Balance training;Patient/family education;Neuromuscular re-education    PT Goals (Current goals can be found in the Care Plan section)  Acute Rehab PT Goals Patient Stated Goal: get better PT Goal Formulation: With patient Time For Goal Achievement: 10/21/17 Potential to Achieve Goals: Good    Frequency Min 5X/week   Barriers to discharge Decreased caregiver support lives alone    Co-evaluation               AM-PAC PT "6 Clicks" Daily Activity  Outcome Measure Difficulty turning over in bed (including adjusting bedclothes, sheets and blankets)?: Unable Difficulty moving from lying on back to sitting on the side of the bed? : Unable Difficulty sitting down on and standing up from a chair with arms (e.g., wheelchair,  bedside commode, etc,.)?: Unable Help needed moving to and from a bed to chair (including a wheelchair)?: A Lot Help needed walking in hospital room?: A Lot Help needed climbing 3-5 steps with a railing? : Total 6 Click Score: 8    End of Session Equipment Utilized During Treatment: Gait belt;Back brace Activity Tolerance: Patient tolerated treatment well Patient left: in chair;with call bell/phone within reach;with family/visitor present Nurse Communication: Mobility status PT Visit Diagnosis: Muscle weakness (generalized) (M62.81);Difficulty in walking, not elsewhere classified (R26.2);Pain Pain - Right/Left: (back) Pain - part of body: (back)    Time: 1950-9326 PT Time Calculation (min) (ACUTE ONLY): 30 min   Charges:   PT Evaluation $PT Eval Moderate Complexity: 1 Mod PT Treatments $Therapeutic Activity: 8-22 mins   PT G Codes:        Marshall  Shiprock 10/07/2017, 1:35 PM

## 2017-10-07 NOTE — Progress Notes (Signed)
   10/06/17 2330  Vitals  BP (!) 187/75  MAP (mmHg) 106  Pulse Rate 90  ECG Heart Rate 90  Resp 19  Oxygen Therapy  SpO2 97 %  Provider Notification  Provider Name/Title Dr. Annette Stable  Time Notified 2330  Notification Type Call  Notification Reason Change in status (BP >180, no parameters)  Response See new orders (Hydralazine PRN added for SBP >160)  Time of Response 2340

## 2017-10-07 NOTE — Progress Notes (Signed)
Postop day 1.  Patient doing well.  Back pain very well controlled.  Lower extremities feel much better.  Much better strength and sensation.  Still with some lower abdominal discomfort.  Patient with hypertension.  She has been restarted on her home medications hopefully this will help.  She is afebrile.  Her vitals are otherwise stable.  She is awake and alert.  She is oriented and appropriate.  Cranial nerve function intact.  Motor examination 5/5 bilateral upper extremities.  4+ strength in both lower extremities (very much improved) sensory examination some mild sensory decrease in both lower extremities also much improved from preop.  Patient doing very well following thoracic decompression and fusion for treatment of her epidural tumor.  Begin to mobilize today.  Awaiting pathology results.

## 2017-10-07 NOTE — Progress Notes (Addendum)
   10/07/17 0230  Vitals  BP (!) 186/74  MAP (mmHg) 103  Pulse Rate (!) 101  ECG Heart Rate (!) 101  Resp 19  Oxygen Therapy  SpO2 100 %  Provider Notification  Provider Name/Title Dr. Annette Stable  Time Notified [phone]  Notification Type Call  Notification Reason Change in status (BP above 160 SBP)  Response No new orders  Time of Response 0250   Dr. Annette Stable will address patient's chronic HTN in am. Orders to utilize PRN.

## 2017-10-07 NOTE — Anesthesia Postprocedure Evaluation (Signed)
Anesthesia Post Note  Patient: Jamie Ewing  Procedure(s) Performed: THORACIC NINE AND TEN LAMINECTOMY WITH RESECTION OF TUMOR, THORACIC EIGHT TO THORACIC ELEVEN FUSION WITH PEDICLE SCREW FIXATION (N/A Back)     Patient location during evaluation: PACU Anesthesia Type: General Level of consciousness: awake and alert Pain management: pain level controlled Vital Signs Assessment: post-procedure vital signs reviewed and stable Respiratory status: spontaneous breathing, nonlabored ventilation, respiratory function stable and patient connected to nasal cannula oxygen Cardiovascular status: blood pressure returned to baseline and stable Postop Assessment: no apparent nausea or vomiting Anesthetic complications: no    Last Vitals:  Vitals:   10/07/17 0630 10/07/17 0700  BP: (!) 193/72 (!) 185/76  Pulse: (!) 107 (!) 106  Resp: 18 17  Temp:    SpO2: 99% 98%    Last Pain:  Vitals:   10/07/17 0400  TempSrc: Oral  PainSc: Asleep                 Krupa Stege

## 2017-10-07 NOTE — Progress Notes (Signed)
Orthopedic Tech Progress Note Patient Details:  [name] [birth year] [id number]  Patient ID: Eber Hong, female   DOB: 08-Jul-1940, 77 y.o.   MRN: [id number]   Hildred Priest 10/07/2017, 9:49 AM Called in bio-tech brace order; spoke with answering service

## 2017-10-08 LAB — GLUCOSE, CAPILLARY
GLUCOSE-CAPILLARY: 132 mg/dL — AB (ref 70–99)
GLUCOSE-CAPILLARY: 129 mg/dL — AB (ref 70–99)
Glucose-Capillary: 122 mg/dL — ABNORMAL HIGH (ref 70–99)
Glucose-Capillary: 148 mg/dL — ABNORMAL HIGH (ref 70–99)

## 2017-10-08 NOTE — Progress Notes (Signed)
Postop day 1.  Overall patient doing very well.  Pain well controlled.  No lower extremity symptoms.  Able to stand and walk some yesterday.  Awake and alert.  Oriented and appropriate.  Motor and sensory function intact.  Wound clean and dry.  Chest and abdomen benign.  Patient making very good progress.  Remove catheter today.  Mobilize and transfer to floor.

## 2017-10-09 ENCOUNTER — Encounter (HOSPITAL_COMMUNITY): Payer: Self-pay | Admitting: Neurosurgery

## 2017-10-09 LAB — GLUCOSE, CAPILLARY
Glucose-Capillary: 134 mg/dL — ABNORMAL HIGH (ref 70–99)
Glucose-Capillary: 136 mg/dL — ABNORMAL HIGH (ref 70–99)
Glucose-Capillary: 108 mg/dL — ABNORMAL HIGH (ref 70–99)
Glucose-Capillary: 138 mg/dL — ABNORMAL HIGH (ref 70–99)

## 2017-10-09 NOTE — Progress Notes (Signed)
Postop day 3.  Overall doing well.  Preoperative back and lower extremity pain and weakness much improved.  Ambulating with therapy.  Voiding well.  Afebrile.  Vital signs are stable.  Motor examination with very minimal distal weakness lower extremities bilaterally.  Chest and abdomen benign.  Wound healing well.  Status post thoracic decompression and fusion for treatment of her T10 pathologic fracture and epidural tumor.  Pathology still pending.  I have reached out to radiation oncology for postoperative radiation therapy.  Awaiting final path with regard to oncology referral.

## 2017-10-09 NOTE — Care Management Important Message (Signed)
Important Message  Patient Details  Name: Jamie Ewing MRN: [id number] Date of Birth: 11-03-1940   Medicare Important Message Given:  Yes    [name] 10/09/2017, 3:22 PM

## 2017-10-09 NOTE — Progress Notes (Signed)
Physical Therapy Treatment Patient Details Name: Jamie Ewing MRN: [id number] DOB: [birth year] Today's Date: 10/09/2017    History of Present Illness 77 year old female with progressive back pain and rapid onset bilateral lower extremity numbness and weakness.  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  Patient presents now for emergent decompression and fusion surgery     PT Comments    Pt with good progress today, able to get to EOB with use of rail and supervision. No dizziness in standing so was able to progress ambulation. Pt with partial knee buckling bilaterally with UE compensation on RW as well as need for consistent min A. PT will continue to follow.    Follow Up Recommendations  SNF;Supervision for mobility/OOB     Equipment Recommendations  Rolling walker with 5" wheels    Recommendations for Other Services       Precautions / Restrictions Precautions Precautions: Fall;Back Precaution Booklet Issued: No Precaution Comments: reviewed precautions, pt recalled no twisting, needed cues for bending and lifting Required Braces or Orthoses: Spinal Brace Spinal Brace: Thoracolumbosacral orthotic;Applied in sitting position Restrictions Weight Bearing Restrictions: No    Mobility  Bed Mobility Overal bed mobility: Needs Assistance Bed Mobility: Rolling;Sidelying to Sit Rolling: Supervision Sidelying to sit: Supervision       General bed mobility comments: pt able to roll to L and sit up with HOB at 15 degrees and use of rail but no physical assist  Transfers Overall transfer level: Needs assistance Equipment used: Rolling walker (2 wheeled) Transfers: Sit to/from Stand Sit to Stand: Min assist         General transfer comment: pt unable to stand with pushing both hands on bed. Min A for power up with pt's one hand on RW and therapist stabilizing  RW  Ambulation/Gait Ambulation/Gait assistance: Min assist Gait Distance (Feet): 12 Feet Assistive device: Rolling walker (2 wheeled) Gait Pattern/deviations: Decreased stride length;Step-through pattern Gait velocity: decreased Gait velocity interpretation: <1.31 ft/sec, indicative of household ambulator General Gait Details: pt with partial buckling of B knees and decreased LE control overall but able to compensate with UE's on RW, needed min A for support, high fall risk at this point, pt aware   Stairs             Wheelchair Mobility    Modified Rankin (Stroke Patients Only)       Balance Overall balance assessment: Needs assistance Sitting-balance support: Bilateral upper extremity supported Sitting balance-Leahy Scale: Good Sitting balance - Comments: much improved   Standing balance support: Bilateral upper extremity supported Standing balance-Leahy Scale: Poor Standing balance comment: unable to stand without support of RW                            Cognition Arousal/Alertness: Awake/alert Behavior During Therapy: WFL for tasks assessed/performed Overall Cognitive Status: Within Functional Limits for tasks assessed                                        Exercises General Exercises - Lower Extremity Ankle Circles/Pumps: AROM;Both;20 reps;Seated Long Arc Quad: AROM;Both;10 reps;Seated(with cues for slow motion for increased control)    General Comments General comments (skin integrity, edema, etc.): no dizziness with treatment today      Pertinent Vitals/Pain Pain Assessment: 0-10 Pain Score: 6  Pain Location: back Pain Descriptors / Indicators: Aching;Operative site guarding Pain Intervention(s): Limited activity within patient's tolerance;Monitored during session    Home Living                      Prior Function            PT Goals (current goals can now be found in the care plan section) Acute Rehab PT  Goals Patient Stated Goal: get better PT Goal Formulation: With patient Time For Goal Achievement: 10/21/17 Potential to Achieve Goals: Good Progress towards PT goals: Progressing toward goals    Frequency    Min 5X/week      PT Plan Current plan remains appropriate    Co-evaluation              AM-PAC PT "6 Clicks" Daily Activity  Outcome Measure  Difficulty turning over in bed (including adjusting bedclothes, sheets and blankets)?: A Little Difficulty moving from lying on back to sitting on the side of the bed? : A Little Difficulty sitting down on and standing up from a chair with arms (e.g., wheelchair, bedside commode, etc,.)?: Unable Help needed moving to and from a bed to chair (including a wheelchair)?: A Little Help needed walking in hospital room?: A Lot Help needed climbing 3-5 steps with a railing? : Total 6 Click Score: 13    End of Session Equipment Utilized During Treatment: Gait belt;Back brace Activity Tolerance: Patient tolerated treatment well Patient left: in chair;with call bell/phone within reach Nurse Communication: Mobility status PT Visit Diagnosis: Muscle weakness (generalized) (M62.81);Difficulty in walking, not elsewhere classified (R26.2);Pain Pain - Right/Left: (back) Pain - part of body: (back)     Time: 1102-1130 PT Time Calculation (min) (ACUTE ONLY): 28 min  Charges:  $Gait Training: 8-22 mins $Therapeutic Exercise: 8-22 mins                    G Codes:       [place]  [place] 10/09/2017, 11:47 AM

## 2017-10-09 NOTE — Evaluation (Signed)
Occupational Therapy Evaluation Patient Details Name: Jamie Ewing MRN: [id number] DOB: Mar 12, 1941 Today's Date: 10/09/2017    History of Present Illness 77 year old female with progressive back pain and rapid onset bilateral lower extremity numbness and weakness.  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  Patient presents now for emergent decompression and fusion surgery    Clinical Impression   PTA Pt independent in ADL/IADL and functional transfers. Works at [employer]. Pt is currently max A for LB ADL, Max A to don/doff brace in sitting, and mod A for functional transfers. Back handout provided and reviewed adls in detail. Pt educated on: clothing between brace, never sleep in brace, avoid sitting for long periods of time, correct bed positioning for sleeping, correct sequence for bed mobility, avoiding lifting more than 5 pounds and never wash directly over incision. Verbally introduced AE for LB ADL. Pt will require skilled OT in the acute setting and afterwards at SNF level to maximize safety and independence in ADL and functional transfers. Next session take AE kit for education.      Follow Up Recommendations  SNF;Supervision/Assistance - 24 hour    Equipment Recommendations  Other (comment);3 in 1 bedside commode(defer to next venue)    Recommendations for Other Services       Precautions / Restrictions Precautions Precautions: Fall;Back Precaution Booklet Issued: No Precaution Comments: reviewed precautions, pt recalled no twisting, needed cues for bending and lifting Required Braces or Orthoses: Spinal Brace Spinal Brace: Thoracolumbosacral orthotic;Applied in sitting position Restrictions Weight Bearing Restrictions: No      Mobility Bed Mobility Overal bed mobility: Needs Assistance Bed Mobility: Rolling;Sit to Sidelying Rolling: Supervision       Sit to sidelying: Mod  assist(min A for BLE back into bed) General bed mobility comments: vc for sequencing, use of bed rail, assist for BLE back into bed  Transfers Overall transfer level: Needs assistance Equipment used: Rolling walker (2 wheeled) Transfers: Sit to/from [facility] Sit to Stand: Mod assist Stand pivot transfers: Min assist       General transfer comment: vc for safe hand placement, watching RLE for buckling    Balance Overall balance assessment: Needs assistance Sitting-balance support: Bilateral upper extremity supported Sitting balance-Leahy Scale: Good Sitting balance - Comments: EOB to doff brace   Standing balance support: Bilateral upper extremity supported Standing balance-Leahy Scale: Poor Standing balance comment: unable to stand without support of RW                           ADL either performed or assessed with clinical judgement   ADL Overall ADL's : Needs assistance/impaired Eating/Feeding: Sitting   Grooming: Set up;Sitting Grooming Details (indicate cue type and reason): educated on cup method for oral care Upper Body Bathing: Moderate assistance;Sitting   Lower Body Bathing: Maximal assistance;Sitting/lateral leans   Upper Body Dressing : Maximal assistance;Sitting Upper Body Dressing Details (indicate cue type and reason): ton don/doff brace Lower Body Dressing: Total assistance Lower Body Dressing Details (indicate cue type and reason): to don socks Toilet Transfer: Moderate assistance;Stand-pivot;BSC;RW Toilet Transfer Details (indicate cue type and reason): vc for safe hand placement, mod A for boost from recliner Toileting- Clothing Manipulation and Hygiene: Maximal assistance;Sit to/from stand;+2 for safety/equipment       Functional mobility during ADLs: Moderate assistance;Rolling walker;Cueing for sequencing(SPT only) General ADL Comments: ADL application to back precautions, initiated verbal education for  AE, brace      Vision Patient Visual Report: No change from baseline       Perception     Praxis      Pertinent Vitals/Pain Pain Assessment: 0-10 Pain Score: 5  Pain Location: back Pain Descriptors / Indicators: Aching;Operative site guarding Pain Intervention(s): Limited activity within patient's tolerance;Monitored during session;Repositioned     Hand Dominance Right   Extremity/Trunk Assessment Upper Extremity Assessment Upper Extremity Assessment: Overall WFL for tasks assessed   Lower Extremity Assessment Lower Extremity Assessment: Defer to PT evaluation   Cervical / Trunk Assessment Cervical / Trunk Assessment: Other exceptions Cervical / Trunk Exceptions: s/p sx   Communication Communication Communication: No difficulties   Cognition Arousal/Alertness: Awake/alert Behavior During Therapy: WFL for tasks assessed/performed Overall Cognitive Status: Within Functional Limits for tasks assessed                                     General Comments       Exercises     Shoulder Instructions      Home Living Family/patient expects to be discharged to:: Private residence Living Arrangements: Alone Available Help at Discharge: Family;Available 24 hours/day Type of Home: Apartment Home Access: Level entry     Home Layout: One level               Home Equipment: None   Additional Comments: pt lives alone but reports she is discussing going home with family or someone coming home with her, did not seem like it would be a problem      Prior Functioning/Environment Level of Independence: Independent        Comments: drives, was working at [employer] as a Scientist, water quality until a week ago        OT Problem List: Decreased activity tolerance;Impaired balance (sitting and/or standing);Decreased safety awareness;Decreased knowledge of precautions;Decreased knowledge of use of DME or AE;Pain      OT Treatment/Interventions: Self-care/ADL training;DME  and/or AE instruction;Therapeutic activities;Patient/family education;Balance training    OT Goals(Current goals can be found in the care plan section) Acute Rehab OT Goals Patient Stated Goal: "get back to independent" OT Goal Formulation: With patient Time For Goal Achievement: 10/23/17 Potential to Achieve Goals: Good ADL Goals Pt Will Perform Grooming: with supervision;standing Pt Will Perform Lower Body Bathing: with supervision;with adaptive equipment;sit to/from stand Pt Will Perform Lower Body Dressing: with supervision;with adaptive equipment;sit to/from stand Pt Will Transfer to Toilet: with supervision;ambulating Pt Will Perform Toileting - Clothing Manipulation and hygiene: with supervision;sit to/from stand;with adaptive equipment Additional ADL Goal #1: Pt will perform bed mobility at mod I level demonstrating log roll technique  OT Frequency: Min 2X/week   Barriers to D/C:    Pt lives alone       Co-evaluation              AM-PAC PT "6 Clicks" Daily Activity     Outcome Measure Help from another person eating meals?: None Help from another person taking care of personal grooming?: A Little(in sitting) Help from another person toileting, which includes using toliet, bedpan, or urinal?: A Lot Help from another person bathing (including washing, rinsing, drying)?: A Lot Help from another person to put on and taking off regular upper body clothing?: A Lot Help from another person to put on and taking off regular lower body clothing?: Total 6 Click Score: 14   End of Session  Equipment Utilized During Treatment: Rolling walker;Back brace Nurse Communication: Mobility status;Precautions  Activity Tolerance: Patient tolerated treatment well Patient left: in bed;with call bell/phone within reach;with bed alarm set  OT Visit Diagnosis: Unsteadiness on feet (R26.81);Other abnormalities of gait and mobility (R26.89);Pain Pain - part of body: (back)                 Time: 6438-3818 OT Time Calculation (min): 24 min Charges:  OT General Charges $OT Visit: 1 Visit OT Evaluation $OT Eval Moderate Complexity: 1 Mod OT Treatments $Self Care/Home Management : 8-22 mins G-Codes:     Hulda Humphrey OTR/L [place] 10/09/2017, 5:49 PM

## 2017-10-09 NOTE — Clinical Social Work Note (Signed)
Clinical Social Work Assessment  Patient Details  Name: Jamie Ewing MRN: [id number] Date of Birth: [birth year]  Date of referral:  10/09/17               Reason for consult:  Facility Placement, Discharge Planning                Permission sought to share information with:  Facility Sport and exercise psychologist, Family Supports Permission granted to share information::  Yes, Verbal Permission Granted  Name::     Jamie Ewing  Agency::  [facility]  Relationship::  daughter  Contact Information:  [phone]  Housing/Transportation Living arrangements for the past 2 months:  Apartment Source of Information:  Patient Patient Interpreter Needed:  None Criminal Activity/Legal Involvement Pertinent to Current Situation/Hospitalization:  No - Comment as needed Significant Relationships:  Adult Children, Other Family Members, Community Support Lives with:  Self Do you feel safe going back to the place where you live?  Yes(post rehab) Need for family participation in patient care:  Yes (Comment)  Care giving concerns:  Pt requiring assistance with ADLs and IADLs, limited by pain, interested in rehab since she lives alone.    Social Worker assessment / plan:  CSW met with pt at bedside, pt sitting up in chair, pleasant and welcoming of visit. Pt states she lives along in [place] and has good support from her daughter. Pt daughter works in the business office at [employer] and would like to be discharged there for more therapies. Pt states she is pleased with progress so far and looks forward to getting stronger. All questions answered, [facility] [place] following, they have started authorization for pt's SNF stay.   Employment status:  Retired Nurse, adult PT Recommendations:  [place], [place] / Referral to community resources:  [place]  Patient/Family's Response to care:  Pt pleasant,  welcoming of visit, pt amenable to SNF stay.   Patient/Family's Understanding of and Emotional Response to Diagnosis, Current Treatment, and Prognosis:  Pt understanding of diagnosis, current treatment and prognosis. Pt states appropriate goals for care and optimism about further improvements. She feels comfortable knowing she has a plan and will be in a safe place with good supports around her.   Emotional Assessment Appearance:  Appears stated age Attitude/Demeanor/Rapport:  Ambitious, Engaged, Gracious Affect (typically observed):  Accepting, Adaptable, Appropriate, Pleasant, Hopeful Orientation:  Oriented to Self, Oriented to Place, Oriented to Situation, Oriented to  Time Alcohol / Substance use:  Alcohol Use(occasional) Psych involvement (Current and /or in the community):  No (Comment)  Discharge Needs  Concerns to be addressed:  Care Coordination Readmission within the last 30 days:  No Current discharge risk:  Dependent with Mobility, Lives alone Barriers to Discharge:  Ship broker, Continued Medical Work up   [employer], [place] 10/09/2017, 12:52 PM

## 2017-10-09 NOTE — Social Work (Signed)
Insurance authorization initiated for pt through [employer].   Alexander Mt, [place] Work [phone]

## 2017-10-09 NOTE — NC FL2 (Addendum)
[place] LEVEL OF CARE SCREENING TOOL     IDENTIFICATION  Patient Name: Jamie Ewing Birthdate: 1940/04/28 Sex: female Admission Date (Current Location): 10/06/2017  [facility] and [place] Number:  [employer] and Address:  [facility] [place], [place] [address], [place], [place] [address]      Provider Number: [id number]  Attending Physician Name and Address:  Earnie Larsson, MD  Relative Name and Phone Number:   Sheliah Plane, daughter, [phone]    Current Level of Care: Hospital Recommended Level of Care: [place] Prior Approval Number:    Date Approved/Denied:   PASRR Number: [id number]  Discharge Plan: SNF    Current Diagnoses: Patient Active Problem List   Diagnosis Date Noted  . Status post surgery 10/06/2017  . Thoracic spine tumor 10/06/2017  . Spinal stenosis of thoracic region 10/06/2017  . Thoracic stenosis 10/06/2017  . Lipoma of axilla 05/31/2011    Orientation RESPIRATION BLADDER Height & Weight        Normal Continent, External catheter Weight: 184 lb 8.4 oz (83.7 kg) Height:  5\' 3"  (160 cm)  BEHAVIORAL SYMPTOMS/MOOD NEUROLOGICAL BOWEL NUTRITION STATUS      Continent Diet(see discharge summary)  AMBULATORY STATUS COMMUNICATION OF NEEDS Skin   Extensive Assist Verbally Surgical wounds(surgical incision on back)                       Personal Care Assistance Level of Assistance  Bathing, Feeding, Dressing Bathing Assistance: Maximum assistance Feeding assistance: Independent Dressing Assistance: Maximum assistance     Functional Limitations Info  Sight, Hearing, Speech Sight Info: Adequate(wears glasses) Hearing Info: Adequate Speech Info: Adequate    SPECIAL CARE FACTORS FREQUENCY  PT (By licensed PT), OT (By licensed OT)     PT Frequency: 5x week OT Frequency: 5x week            Contractures Contractures Info: Not present    Additional Factors Info  Code Status,  Allergies Code Status Info: Full Code Allergies Info: No Known Allergies            Current Medications (10/09/2017):  This is the current hospital active medication list Current Facility-Administered Medications  Medication Dose Route Frequency Provider Last Rate Last Dose  . 0.9 %  sodium chloride infusion  250 mL Intravenous Continuous Earnie Larsson, MD   Stopped at 10/07/17 1432  . acetaminophen (TYLENOL) tablet 650 mg  650 mg Oral Q4H PRN Earnie Larsson, MD   650 mg at 10/09/17 1023   Or  . acetaminophen (TYLENOL) suppository 650 mg  650 mg Rectal Q4H PRN Earnie Larsson, MD      . amLODipine (NORVASC) tablet 5 mg  5 mg Oral Daily Earnie Larsson, MD   5 mg at 10/09/17 1024  . bisacodyl (DULCOLAX) suppository 10 mg  10 mg Rectal Daily PRN Earnie Larsson, MD      . dexamethasone (DECADRON) injection 4 mg  4 mg Intravenous Q6H Earnie Larsson, MD   4 mg at 10/09/17 1601   Or  . dexamethasone (DECADRON) tablet 4 mg  4 mg Oral Q6H Earnie Larsson, MD   4 mg at 10/09/17 0036  . diazepam (VALIUM) tablet 5-10 mg  5-10 mg Oral Q6H PRN Earnie Larsson, MD   10 mg at 10/09/17 1023  . hydrALAZINE (APRESOLINE) injection 10 mg  10 mg Intravenous Q4H PRN Earnie Larsson, MD   10 mg at 10/07/17 1030  . hydrochlorothiazide (HYDRODIURIL)  tablet 25 mg  25 mg Oral Daily Earnie Larsson, MD   25 mg at 10/08/17 0902  . HYDROcodone-acetaminophen (NORCO) 10-325 MG per tablet 1 tablet  1 tablet Oral Q4H PRN Earnie Larsson, MD   1 tablet at 10/09/17 [phone]  . HYDROmorphone (DILAUDID) injection 1 mg  1 mg Intravenous Q2H PRN Earnie Larsson, MD   1 mg at 10/08/17 1218  . insulin aspart (novoLOG) injection 0-15 Units  0-15 Units Subcutaneous TID WC Earnie Larsson, MD   2 Units at 10/09/17 [phone]  . menthol-cetylpyridinium (CEPACOL) lozenge 3 mg  1 lozenge Oral PRN Earnie Larsson, MD       Or  . phenol (CHLORASEPTIC) mouth spray 1 spray  1 spray Mouth/Throat PRN Earnie Larsson, MD      . metoprolol tartrate (LOPRESSOR) tablet 25 mg  25 mg Oral BID Earnie Larsson, MD    25 mg at 10/09/17 1023  . ondansetron (ZOFRAN) tablet 4 mg  4 mg Oral Q6H PRN Earnie Larsson, MD       Or  . ondansetron [facility]) injection 4 mg  4 mg Intravenous Q6H PRN Earnie Larsson, MD   4 mg at 10/06/17 2347  . oxyCODONE (Oxy IR/ROXICODONE) immediate release tablet 10 mg  10 mg Oral Q3H PRN Earnie Larsson, MD      . polyethylene glycol (MIRALAX / GLYCOLAX) packet 17 g  17 g Oral Daily PRN Earnie Larsson, MD   17 g at 10/09/17 1024  . sodium chloride flush (NS) 0.9 % injection 3 mL  3 mL Intravenous Q12H Earnie Larsson, MD   3 mL at 10/09/17 1026  . sodium chloride flush (NS) 0.9 % injection 3 mL  3 mL Intravenous PRN Earnie Larsson, MD      . sodium phosphate (FLEET) 7-19 GM/118ML enema 1 enema  1 enema Rectal Once PRN Earnie Larsson, MD         Discharge Medications: Please see discharge summary for a list of discharge medications.  Relevant Imaging Results:  Relevant Lab Results:   Additional Information [id number] [place], [place]

## 2017-10-10 ENCOUNTER — Ambulatory Visit
Admit: 2017-10-10 | Discharge: 2017-10-10 | Disposition: A | Discharge status: Home / Self Care | Payer: Medicare HMO | Attending: Radiation Oncology | Admitting: Radiation Oncology

## 2017-10-10 DIAGNOSIS — C9000 Multiple myeloma not having achieved remission: Secondary | ICD-10-CM | POA: Insufficient documentation

## 2017-10-10 LAB — GLUCOSE, CAPILLARY
GLUCOSE-CAPILLARY: 142 mg/dL — AB (ref 70–99)
GLUCOSE-CAPILLARY: 115 mg/dL — AB (ref 70–99)
Glucose-Capillary: 127 mg/dL — ABNORMAL HIGH (ref 70–99)
Glucose-Capillary: 136 mg/dL — ABNORMAL HIGH (ref 70–99)

## 2017-10-10 NOTE — Progress Notes (Signed)
Overall continues to do well.  Pain well managed.  Mobility improving.  No new issues.  Awake and alert.  Oriented and appropriate.  Vital signs stable.  Motor examination with very mild weakness of both lower extremities.  Wound clean and dry.  Chest and abdomen benign.  Progressing well following thoracic tumor resection and spinal fusion.  Continue efforts at mobilization.  Working toward discharge, home versus skilled nursing facility

## 2017-10-10 NOTE — Social Work (Signed)
Updated clinicals sent to SNF- continuing to follow, aware pt likely ready for discharge tomorrow.   Alexander Mt, [place] Work [phone]

## 2017-10-10 NOTE — Consult Note (Signed)
Radiation Oncology         ([phone]) [phone] ________________________________  Initial inpatient Consultation  Name: Jamie Ewing MRN: [id number]  Date of Service: 10/06/2017 DOB: Jul 01, 1940  FK:CLEXN, Carloyn Manner, MD  No ref. provider found   REFERRING PHYSICIAN: No ref. provider found  DIAGNOSIS: The primary encounter diagnosis was Thoracic compression fracture, closed, initial encounter ([place]). A diagnosis of Surgery, elective was also pertinent to this visit. 77 y.o. with newly diagnosed multiple myeloma s/p recent laminectomy for decompression/partial corpectomy at T10 for cord compression.    ICD-10-CM   1. Thoracic compression fracture, closed, initial encounter ([place]) S22.000A   2. Surgery, elective Z41.9 DG Thoracic Spine 2 View    DG Thoracic Spine 2 View    CANCELED: DG Thoracic Spine 1 View    CANCELED: DG Thoracic Spine 1 View    HISTORY OF PRESENT ILLNESS: Jamie Ewing is a 77 y.o. female seen at the request of Dr. Trenton Gammon.  She reports mid-low back pain ongoing since October 2018, previously treated for DDD but pain was progressive despite treatment. She had been evaluated in the ER at [facility] on 7/12 and 7/19 for progressive pain and discharged home with steroids and pain medications.  However, on Friday 10/06/17, a few hours after discharge home from the ER, she developed cord compression with paraesthesias in the LEs and inability to walk or move her LEs.  She presented back to the ER at [facility] and and MRI of the lumbar spine was performed at that time showing diffuse lesions throughout the thoracolumbar spine and pelvis most consistent with malignancy, multiple myeloma versus metastatic disease.  There was an abnormal bone lesion, most prominent in the posterior aspect of the T10 vertebral body, extending into the posterior elements with a  9 x 15 x 44 mm posterior epidural soft tissue mass.  Additionally, there is pathologic compression fracture of the T10  vertebral body with 6 mm of retropulsion and approximately 50% height loss resulting in severe multifactorial spinal stenosis and cord compression of the lower thoracic spinal cord.  Conus extends to the T12 level. Conus and cauda equina appear normal.  She was transferred to [facility] for neurosurgical evaluation with Dr. Trenton Gammon and proceeded with an urgent T9-T10 laminectomy with resection of epidural tumor, T10 bilateral transpedicular decompression/partial corpectomy and T8 T9-T10-T11 posterior lateral arthrodesis utilizing segmental pedicle screw fixation and morselized allograft on 09/18/17.  She reports that since the time of her procedure, she has regained sensation and movement in the lower extremities.  She has been able to get up and walk around her hospital room without assistance earlier today which she is quite pleased with.  She reports minimal, well controlled pain at this point.  She denies any bowel or bladder incontinence.  Surgical pathology returned a plasma cell neoplasm consistent with multiple myeloma.  She denies any prior personal history of cancer and has no known family history of cancers in her immediate family.  She had continued working part-time, 4 days a week, despite her pain, up until 10/05/2017.   We have been asked to see this patient to discuss the potential role for postoperative radiotherapy in the management of her disease. [name]  PREVIOUS RADIATION THERAPY: No  PAST MEDICAL HISTORY:  Past Medical History:  Diagnosis Date  . Back pain   . Hypercholesteremia   . Hypertension       PAST SURGICAL HISTORY: Past Surgical History:  Procedure Laterality Date  .  LAMINECTOMY N/A 10/06/2017   Procedure: THORACIC NINE AND TEN LAMINECTOMY WITH RESECTION OF TUMOR, THORACIC EIGHT TO THORACIC ELEVEN FUSION WITH PEDICLE SCREW FIXATION;  Surgeon: Earnie Larsson, MD;  Location: [place];  Service: Neurosurgery;  Laterality: N/A;  . TOTAL KNEE ARTHROPLASTY  2001    FAMILY  HISTORY: History reviewed. No pertinent family history.  SOCIAL HISTORY:  Social History   Socioeconomic History  . Marital status: Widowed    Spouse name: Not on file  . Number of children: Not on file  . Years of education: Not on file  . Highest education level: Not on file  Occupational History  . Not on file  Social Needs  . Financial resource strain: Not on file  . Food insecurity:    Worry: Not on file    Inability: Not on file  . Transportation needs:    Medical: Not on file    Non-medical: Not on file  Tobacco Use  . Smoking status: Former Smoker    Types: Cigarettes    Last attempt to quit: 05/31/2006    Years since quitting: 11.3  . Smokeless tobacco: Never Used  Substance and Sexual Activity  . Alcohol use: Yes    Comment: wine occassionally  . Drug use: No  . Sexual activity: Not on file  Lifestyle  . Physical activity:    Days per week: Not on file    Minutes per session: Not on file  . Stress: Not on file  Relationships  . Social connections:    Talks on phone: Not on file    Gets together: Not on file    Attends religious service: Not on file    Active member of club or organization: Not on file    Attends meetings of clubs or organizations: Not on file    Relationship status: Not on file  . Intimate partner violence:    Fear of current or ex partner: Not on file    Emotionally abused: Not on file    Physically abused: Not on file    Forced sexual activity: Not on file  Other Topics Concern  . Not on file  Social History Narrative  . Not on file    ALLERGIES: Patient has no known allergies.  MEDICATIONS:  Current Facility-Administered Medications  Medication Dose Route Frequency Provider Last Rate Last Dose  . 0.9 %  sodium chloride infusion  250 mL Intravenous Continuous Earnie Larsson, MD   Stopped at 10/07/17 1432  . acetaminophen (TYLENOL) tablet 650 mg  650 mg Oral Q4H PRN Earnie Larsson, MD   650 mg at 10/09/17 1023   Or  . acetaminophen  (TYLENOL) suppository 650 mg  650 mg Rectal Q4H PRN Earnie Larsson, MD      . amLODipine (NORVASC) tablet 5 mg  5 mg Oral Daily Earnie Larsson, MD   5 mg at 10/10/17 1004  . bisacodyl (DULCOLAX) suppository 10 mg  10 mg Rectal Daily PRN Earnie Larsson, MD      . dexamethasone (DECADRON) injection 4 mg  4 mg Intravenous Q6H Earnie Larsson, MD   4 mg at 10/10/17 0525   Or  . dexamethasone (DECADRON) tablet 4 mg  4 mg Oral Q6H Earnie Larsson, MD   4 mg at 10/09/17 1840  . diazepam (VALIUM) tablet 5-10 mg  5-10 mg Oral Q6H PRN Earnie Larsson, MD   10 mg at 10/09/17 1023  . hydrALAZINE (APRESOLINE) injection 10 mg  10 mg Intravenous Q4H PRN Earnie Larsson, MD  10 mg at 10/07/17 1030  . hydrochlorothiazide (HYDRODIURIL) tablet 25 mg  25 mg Oral Daily Earnie Larsson, MD   25 mg at 10/10/17 1005  . HYDROcodone-acetaminophen (NORCO) 10-325 MG per tablet 1 tablet  1 tablet Oral Q4H PRN Earnie Larsson, MD   1 tablet at 10/10/17 1005  . HYDROmorphone (DILAUDID) injection 1 mg  1 mg Intravenous Q2H PRN Earnie Larsson, MD   1 mg at 10/08/17 1218  . insulin aspart (novoLOG) injection 0-15 Units  0-15 Units Subcutaneous TID WC Earnie Larsson, MD   2 Units at 10/10/17 0900  . menthol-cetylpyridinium (CEPACOL) lozenge 3 mg  1 lozenge Oral PRN Earnie Larsson, MD       Or  . phenol (CHLORASEPTIC) mouth spray 1 spray  1 spray Mouth/Throat PRN Earnie Larsson, MD      . metoprolol tartrate (LOPRESSOR) tablet 25 mg  25 mg Oral BID Earnie Larsson, MD   25 mg at 10/10/17 1005  . ondansetron (ZOFRAN) tablet 4 mg  4 mg Oral Q6H PRN Earnie Larsson, MD       Or  . ondansetron [facility]) injection 4 mg  4 mg Intravenous Q6H PRN Earnie Larsson, MD   4 mg at 10/06/17 2347  . oxyCODONE (Oxy IR/ROXICODONE) immediate release tablet 10 mg  10 mg Oral Q3H PRN Earnie Larsson, MD      . polyethylene glycol (MIRALAX / GLYCOLAX) packet 17 g  17 g Oral Daily PRN Earnie Larsson, MD   17 g at 10/09/17 1024  . sodium chloride flush (NS) 0.9 % injection 3 mL  3 mL Intravenous Q12H Earnie Larsson,  MD   3 mL at 10/10/17 1006  . sodium chloride flush (NS) 0.9 % injection 3 mL  3 mL Intravenous PRN Earnie Larsson, MD      . sodium phosphate (FLEET) 7-19 GM/118ML enema 1 enema  1 enema Rectal Once PRN Earnie Larsson, MD        REVIEW OF SYSTEMS:  On review of systems, the patient reports that she is doing well overall.  Her pain has been well controlled since the time of surgery and she has been able to get up and walk around her room, unassisted, today.  She denies any chest pain, shortness of breath, cough, fevers, chills, night sweats, unintended weight changes.  She denies any bowel or bladder disturbances, and denies abdominal pain, nausea or vomiting.  She denies any new musculoskeletal or joint aches or pains aside from that documented above in the HPI. A complete review of systems is obtained and is otherwise negative.    PHYSICAL EXAM:  Wt Readings from Last 3 Encounters:  10/06/17 184 lb 8.4 oz (83.7 kg)  10/06/17 170 lb (77.1 kg)  09/29/17 175 lb (79.4 kg)   Temp Readings from Last 3 Encounters:  10/10/17 98.1 F (36.7 C) (Oral)  10/06/17 98.2 F (36.8 C) (Oral)  09/29/17 98.5 F (36.9 C) (Oral)   BP Readings from Last 3 Encounters:  10/10/17 (!) 160/67  10/06/17 (!) 176/68  09/29/17 (!) 163/73   Pulse Readings from Last 3 Encounters:  10/10/17 76  10/06/17 84  09/29/17 83   Pain Assessment Pain Score: 6 /10  In general this is a well appearing African-American female in no acute distress.  She is alert and oriented x4 and appropriate throughout the examination. HEENT reveals that the patient is normocephalic, atraumatic. EOMs are intact. PERRLA. Skin is intact without any evidence of gross lesions. Cardiovascular exam reveals a regular  rate and rhythm, no clicks rubs or murmurs are auscultated. Chest is clear to auscultation bilaterally. Lymphatic assessment is performed and does not reveal any adenopathy in the cervical, supraclavicular, axillary, or inguinal chains.  Abdomen has active bowel sounds in all quadrants and is intact. The abdomen is soft, non tender, non distended. Lower extremities are negative for pretibial pitting edema, deep calf tenderness, cyanosis or clubbing.  She is able to move bilateral lower extremities without difficulty.  Sensation is intact to light touch- equal bilaterally and strength is 4/5 and equal bilaterally in the LEs.     KPS = 60  100 - Normal; no complaints; no evidence of disease. 90   - Able to carry on normal activity; minor signs or symptoms of disease. 80   - Normal activity with effort; some signs or symptoms of disease. 59   - Cares for self; unable to carry on normal activity or to do active work. 60   - Requires occasional assistance, but is able to care for most of his personal needs. 50   - Requires considerable assistance and frequent medical care. 13   - Disabled; requires special care and assistance. 42   - Severely disabled; hospital admission is indicated although death not imminent. 42   - Very sick; hospital admission necessary; active supportive treatment necessary. 10   - Moribund; fatal processes progressing rapidly. 0     - Dead  Karnofsky DA, Abelmann [place], Craver LS and Burchenal [facility] [phone]) The use of the nitrogen mustards in the palliative treatment of carcinoma: with particular reference to bronchogenic carcinoma Cancer 1 634-56  LABORATORY DATA:  Lab Results  Component Value Date   WBC 18.5 (H) 10/07/2017   HGB 9.8 (L) 10/07/2017   HCT 29.9 (L) 10/07/2017   MCV 95.8 10/07/2017   PLT 266 10/07/2017   Lab Results  Component Value Date   NA 129 (L) 10/07/2017   K 3.9 10/07/2017   CL 93 (L) 10/07/2017   CO2 20 (L) 10/07/2017   Lab Results  Component Value Date   ALT 26 10/06/2017   AST 20 10/06/2017   ALKPHOS 121 10/06/2017   BILITOT 0.5 10/06/2017     RADIOGRAPHY: Dg Thoracic Spine 2 View  Result Date: 10/06/2017 CLINICAL DATA:  T9 through T11 fusion with pedicle screws and  T10 laminectomy. EXAM: THORACIC SPINE 2 VIEWS; DG C-ARM 61-120 MIN COMPARISON:  10/10/2016 CXR FINDINGS: 59 seconds of fluoroscopic time was utilized during T9 through T11 fusion with pedicle screws and T10 laminectomy procedure. Fine bony detail is limited due to the fluoroscopic technique. Three pairs of pedicle screws are noted along the lower thoracic spine skipping 1 level. No hardware failure is identified. An unnamed catheter/probe is also noted with tip projecting over the left upper quadrant of the abdomen. IMPRESSION: Fluoroscopic time utilized for T10 laminectomy with T9 through T11 fusion with pedicle screws. No immediate intraoperative complications are identified. Electronically Signed   By: Ashley Royalty M.D.   On: 10/06/2017 21:19   Dg Lumbar Spine Complete  Result Date: 09/29/2017 CLINICAL DATA:  Low back pain for 1 week, severe pain since 0300 hours this morning, could not get out of bed, former smoker, hypertension EXAM: LUMBAR SPINE - COMPLETE 4+ VIEW COMPARISON:  None FINDINGS: Osseous demineralization. Five non-rib-bearing lumbar vertebra. Facet degenerative changes lower lumbar spine. Disc space narrowing at L4-L5 with minimal anterolisthesis. Mild disc space narrowing at L2-L3. Vertebral body heights maintained without fracture or additional subluxation. No spondylolysis.  SI joints preserved. Narrowing of the hip joints bilaterally. Atherosclerotic calcifications aorta. IMPRESSION: Osseous demineralization with degenerative disc and facet disease changes of the lumbar spine. Minimal anterolisthesis at L4-L5. No acute abnormalities. Electronically Signed   By: Lavonia Dana M.D.   On: 09/29/2017 16:45   Mr Lumbar Spine Wo Contrast  Result Date: 10/06/2017 CLINICAL DATA:  Rapidly progressive neurological deficit in the lower extremities. EXAM: MRI LUMBAR SPINE WITHOUT CONTRAST TECHNIQUE: Multiplanar, multisequence MR imaging of the lumbar spine was performed. No intravenous contrast was  administered. COMPARISON:  None. FINDINGS: Segmentation:  Standard. Alignment:  Physiologic. Vertebrae: No discitis or osteomyelitis. Diffuse abnormal T1 hypointense and T2 hyperintense lesions throughout the thoracolumbar spine and pelvis most consistent with malignancy, multiple myeloma versus metastatic disease. Abnormal bone lesion most prominent in the posterior aspect of the T10 vertebral body which extends into the posterior elements with a 9 x 15 x 44 mm posterior epidural soft tissue mass. Pathologic compression fracture of the T10 vertebral body with 6 mm of retropulsion and approximately 50% height loss. Severe multifactorial spinal stenosis and cord compression of the lower thoracic spinal cord resulting from the T10 compression fracture and retropulsion in combination with the posterior epidural tumor. Conus medullaris and cauda equina: Conus extends to the T12 level. Conus and cauda equina appear normal. Paraspinal and other soft tissues: No acute paraspinal abnormality. Partially visualized right renal cystic mass. Disc levels: Disc spaces: Degenerative disc disease with disc desiccation at L4-5. T12-L1: No significant disc bulge. No evidence of neural foraminal stenosis. No central canal stenosis. L1-L2: No significant disc bulge. No evidence of neural foraminal stenosis. No central canal stenosis. L2-L3: Mild broad-based disc bulge. Mild bilateral facet arthropathy. No evidence of neural foraminal stenosis. No central canal stenosis. L3-L4: Mild broad-based disc bulge. Moderate bilateral facet arthropathy. No evidence of neural foraminal stenosis. No central canal stenosis. L4-L5: Broad-based disc bulge. Moderate bilateral facet arthropathy. No evidence of neural foraminal stenosis. No central canal stenosis. L5-S1: No significant disc bulge. No evidence of neural foraminal stenosis. No central canal stenosis. Moderate bilateral facet arthropathy. IMPRESSION: 1. Abnormal bone lesion most prominent  in the posterior aspect of the T10 vertebral body which extends into the posterior elements with a 9 x 15 x 44 mm posterior epidural soft tissue mass. Pathologic compression fracture of the T10 vertebral body with 6 mm of retropulsion. Severe multifactorial spinal stenosis and cord compression of the lower thoracic spinal cord resulting from the T10 compression fracture and retropulsion in combination with the posterior epidural tumor. 2. Abnormal bone lesions throughout the thoracolumbar spine and pelvis most consistent with malignancy, metastatic disease versus multiple myeloma. Critical Value/emergent results were called by telephone at the time of interpretation on 10/06/2017 at 3:30 pm to Dr. Julianne Rice , who verbally acknowledged these results. Electronically Signed   By: Kathreen Devoid   On: 10/06/2017 15:32   Dg C-arm 1-60 Min  Result Date: 10/06/2017 CLINICAL DATA:  T9 through T11 fusion with pedicle screws and T10 laminectomy. EXAM: THORACIC SPINE 2 VIEWS; DG C-ARM 61-120 MIN COMPARISON:  10/10/2016 CXR FINDINGS: 59 seconds of fluoroscopic time was utilized during T9 through T11 fusion with pedicle screws and T10 laminectomy procedure. Fine bony detail is limited due to the fluoroscopic technique. Three pairs of pedicle screws are noted along the lower thoracic spine skipping 1 level. No hardware failure is identified. An unnamed catheter/probe is also noted with tip projecting over the left upper quadrant of the abdomen. IMPRESSION: Fluoroscopic time  utilized for T10 laminectomy with T9 through T11 fusion with pedicle screws. No immediate intraoperative complications are identified. Electronically Signed   By: Ashley Royalty M.D.   On: 10/06/2017 21:19   Dg C-arm 1-60 Min  Result Date: 10/06/2017 CLINICAL DATA:  T9 through T11 fusion with pedicle screws and T10 laminectomy. EXAM: THORACIC SPINE 2 VIEWS; DG C-ARM 61-120 MIN COMPARISON:  10/10/2016 CXR FINDINGS: 59 seconds of fluoroscopic time was  utilized during T9 through T11 fusion with pedicle screws and T10 laminectomy procedure. Fine bony detail is limited due to the fluoroscopic technique. Three pairs of pedicle screws are noted along the lower thoracic spine skipping 1 level. No hardware failure is identified. An unnamed catheter/probe is also noted with tip projecting over the left upper quadrant of the abdomen. IMPRESSION: Fluoroscopic time utilized for T10 laminectomy with T9 through T11 fusion with pedicle screws. No immediate intraoperative complications are identified. Electronically Signed   By: Ashley Royalty M.D.   On: 10/06/2017 21:19      IMPRESSION/PLAN: 1. 77 y.o. with newly diagnosed multiple myeloma s/p recent laminectomy for decompression/partial corpectomy at T10. Today, we talked to the patient and family about the findings and workup thus far. We discussed the natural history of multiple myeloma and general treatment, highlighting the role of postoperative radiotherapy in the management. We discussed the available radiation techniques, and focused on the details of logistics and delivery. The recommendation is to proceed with a 2-week course of daily external beam radiotherapy to the surgical site at T10 with a total of 30 Gy delivered in 10 fractions.  We reviewed the anticipated acute and late sequelae associated with radiation in this setting. The patient was encouraged to ask questions that were answered to her satisfaction.  At the conclusion of our conversation, the patient elects to proceed with post op conformal radiotherapy as recommended.  She anticipates discharge to a skilled nursing/rehab facility in [place] as early as tomorrow.  We have requested a medical oncology consult to further discuss systemic treatment options for multiple myeloma as well as any further imaging or lab studies for disease staging.  Ideally, the consult would have been prior to the patient's discharge home but could also happen on an  outpatient basis pending the provider's preference.  We will begin coordinating for a CT simulation/radiation planning appointment in approximately 2 weeks time in anticipation of beginning radiotherapy shortly thereafter.  We enjoyed meeting the patient and her family today and look forward to participating in her care.    Nicholos Johns, PA-C    Tyler Pita, MD  [facility] Direct Dial: [phone]  Fax: [phone] [email or web address]  Skype  LinkedIn

## 2017-10-10 NOTE — Progress Notes (Signed)
Physical Therapy Treatment Patient Details Name: Jamie Ewing MRN: [id number] DOB: 1940-07-16 Today's Date: 10/10/2017    History of Present Illness 77 year old female with progressive back pain and rapid onset bilateral lower extremity numbness and weakness.  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  Patient presents now for emergent decompression and fusion surgery     PT Comments    Pt continues to progress slowly, ambulated 20' with RW and min A. Pt continues to have poor knee and hip control with heavy reliance on RW for support. Continue to recommend SNF at d/c as pt will not be safe home alone with current LE weakness. PT will continue to follow.    Follow Up Recommendations  SNF;Supervision for mobility/OOB     Equipment Recommendations  Rolling walker with 5" wheels    Recommendations for Other Services       Precautions / Restrictions Precautions Precautions: Fall;Back Precaution Booklet Issued: No Precaution Comments: pt recalled no bending and lifting but needed cues for no twisting Required Braces or Orthoses: Spinal Brace Spinal Brace: Thoracolumbosacral orthotic;Applied in sitting position Restrictions Weight Bearing Restrictions: No    Mobility  Bed Mobility Overal bed mobility: Needs Assistance Bed Mobility: Sit to Supine       Sit to supine: Min assist   General bed mobility comments: min A to LE's and vc's for positioning in bed  Transfers Overall transfer level: Needs assistance Equipment used: Rolling walker (2 wheeled) Transfers: Sit to/from Stand Sit to Stand: Min assist         General transfer comment: min A for safety as pt felt dizzy with initial standing  Ambulation/Gait Ambulation/Gait assistance: Min assist Gait Distance (Feet): 20 Feet Assistive device: Rolling walker (2 wheeled) Gait Pattern/deviations: Decreased stride  length;Step-through pattern Gait velocity: decreased Gait velocity interpretation: <1.31 ft/sec, indicative of household ambulator General Gait Details: decreased knee and hip control noted but no overt buckling of knees today   Stairs             Wheelchair Mobility    Modified Rankin (Stroke Patients Only)       Balance Overall balance assessment: Needs assistance Sitting-balance support: No upper extremity supported Sitting balance-Leahy Scale: Good Sitting balance - Comments: EOB to doff brace   Standing balance support: Bilateral upper extremity supported Standing balance-Leahy Scale: Poor Standing balance comment: unable to stand without support of RW                            Cognition Arousal/Alertness: Awake/alert Behavior During Therapy: WFL for tasks assessed/performed Overall Cognitive Status: Within Functional Limits for tasks assessed                                        Exercises General Exercises - Lower Extremity Gluteal Sets: AROM;10 reps;Supine Heel Slides: AROM;Both;10 reps;Supine    General Comments        Pertinent Vitals/Pain Pain Assessment: 0-10 Pain Score: 5  Pain Location: back Pain Descriptors / Indicators: Aching;Constant Pain Intervention(s): Limited activity within patient's tolerance;Monitored during session;Repositioned    Home Living                      Prior Function            PT Goals (current  goals can now be found in the care plan section) Acute Rehab PT Goals Patient Stated Goal: "get back to independent" PT Goal Formulation: With patient Time For Goal Achievement: 10/21/17 Potential to Achieve Goals: Good Progress towards PT goals: Progressing toward goals    Frequency    Min 5X/week      PT Plan Current plan remains appropriate    Co-evaluation              AM-PAC PT "6 Clicks" Daily Activity  Outcome Measure  Difficulty turning over in bed  (including adjusting bedclothes, sheets and blankets)?: A Little Difficulty moving from lying on back to sitting on the side of the bed? : A Little Difficulty sitting down on and standing up from a chair with arms (e.g., wheelchair, bedside commode, etc,.)?: Unable Help needed moving to and from a bed to chair (including a wheelchair)?: A Little Help needed walking in hospital room?: A Little Help needed climbing 3-5 steps with a railing? : Total 6 Click Score: 14    End of Session Equipment Utilized During Treatment: Gait belt;Back brace Activity Tolerance: Patient tolerated treatment well Patient left: in bed;with call bell/phone within reach Nurse Communication: Mobility status PT Visit Diagnosis: Muscle weakness (generalized) (M62.81);Difficulty in walking, not elsewhere classified (R26.2);Pain Pain - part of body: (back)     Time: 0300-9233 PT Time Calculation (min) (ACUTE ONLY): 23 min  Charges:  $Gait Training: 23-37 mins                    G Codes:       Leighton Roach, PT  Acute Rehab Services  [place] 10/10/2017, 11:23 AM

## 2017-10-11 DIAGNOSIS — E8809 Other disorders of plasma-protein metabolism, not elsewhere classified: Secondary | ICD-10-CM

## 2017-10-11 DIAGNOSIS — C9000 Multiple myeloma not having achieved remission: Secondary | ICD-10-CM

## 2017-10-11 DIAGNOSIS — D649 Anemia, unspecified: Secondary | ICD-10-CM

## 2017-10-11 DIAGNOSIS — E871 Hypo-osmolality and hyponatremia: Secondary | ICD-10-CM

## 2017-10-11 DIAGNOSIS — Z87891 Personal history of nicotine dependence: Secondary | ICD-10-CM

## 2017-10-11 LAB — GLUCOSE, CAPILLARY
GLUCOSE-CAPILLARY: 126 mg/dL — AB (ref 70–99)
GLUCOSE-CAPILLARY: 162 mg/dL — AB (ref 70–99)
Glucose-Capillary: 147 mg/dL — ABNORMAL HIGH (ref 70–99)
Glucose-Capillary: 135 mg/dL — ABNORMAL HIGH (ref 70–99)

## 2017-10-11 MED ORDER — DIAZEPAM 5 MG PO TABS: 5.0000 mg | ORAL_TABLET | Freq: Four times a day (QID) | ORAL | 0 refills | Status: DC | PRN

## 2017-10-11 MED ORDER — OXYCODONE HCL 10 MG PO TABS: 10.0000 mg | ORAL_TABLET | ORAL | 0 refills | Status: DC | PRN

## 2017-10-11 NOTE — Social Work (Addendum)
[employer] auth still being reviewed, facility it ready for pt whenever authorization is received.   2:04pm- authorization still pending for [facility].  [place], [place] Work [phone]

## 2017-10-11 NOTE — Discharge Instructions (Signed)

## 2017-10-11 NOTE — Progress Notes (Signed)
[place] Telephone:([phone]) [phone]   [phone]) [phone]  CONSULT NOTE  REFERRING PHYSICIAN: Dr. Earnie Larsson  REASON FOR CONSULTATION:  77 years old African-American female diagnosed with plasmacytoma.  HPI Jamie Ewing is a 77 y.o. female with past medical history significant for hypertension and dyslipidemia.  The patient was complaining of lower thoracic back pain for several weeks.  It was getting worse and she was unable to walk and she had sensory loss in her lower extremities.  She was seen at [facility] and x-ray of the lumbar spines showed osseous demineralization with degenerative disc and facet disease the changes of the lumbar spine.  MRI of the lumbar spine without contrast on 10/06/2017 showed abnormal bone lesion most prominent in the posterior aspect of T10 vertebral body which extends into the posterior elements with a 0.9 x 1.5 x 4.4 cm posterior epidural soft tissue mass.  There was pathologic compression fracture of the T10 vertebral body with 0.6 cm of retropulsion.  There was severe multi-factorial spinal stenosis and cord compression of the lower thoracic spinal cord resulting from the T10 compression fracture and retropulsion in combination with posterior epidural tumor.  There was also abnormal bone lesions throughout the thoracolumbar spine and pelvis most consistent with malignancy, metastatic disease versus multiple myeloma.  The patient was transferred to [facility] for further evaluation and on 10/06/2017 she underwent T9-T10 laminectomy with resection of the epidural tumor at T10 with bilateral trans-pedicular decompression/partial corpectomy under the care of Dr. Annette Stable. The final pathology ([id number]) showed plasma cell neoplasm.  Immunohistochemistry is positive for CD 138 and plasma cells are kappa restricted by light chain in situ hybridization.  The findings are consistent with involvement by plasma cell neoplasm. I was consulted  to see the patient today for evaluation and recommendation regarding her recent diagnosis with multiple myeloma. When seen today the patient is feeling much better.  She denied having any current chest pain, shortness of breath, cough or hemoptysis.  She denied having any fever or chills.  She has no nausea, vomiting, diarrhea or constipation.  She denied having any headache or visual changes. Family history is unremarkable for any malignancy. The patient is a widow and has 2 children.  She lives in [place].  She currently works in [employer].  She has a history of smoking but quit in March 2008.  She denied having any history of alcohol or drug abuse.  HPI  Past Medical History:  Diagnosis Date  . Back pain   . Hypercholesteremia   . Hypertension     Past Surgical History:  Procedure Laterality Date  . LAMINECTOMY N/A 10/06/2017   Procedure: THORACIC NINE AND TEN LAMINECTOMY WITH RESECTION OF TUMOR, THORACIC EIGHT TO THORACIC ELEVEN FUSION WITH PEDICLE SCREW FIXATION;  Surgeon: Earnie Larsson, MD;  Location: [place];  Service: Neurosurgery;  Laterality: N/A;  . TOTAL KNEE ARTHROPLASTY  2001    History reviewed. No pertinent family history.  Social History Social History   Tobacco Use  . Smoking status: Former Smoker    Types: Cigarettes    Last attempt to quit: 05/31/2006    Years since quitting: 11.3  . Smokeless tobacco: Never Used  Substance Use Topics  . Alcohol use: Yes    Comment: wine occassionally  . Drug use: No    No Known Allergies  Current Facility-Administered Medications  Medication Dose Route Frequency Provider Last Rate Last Dose  . 0.9 %  sodium chloride infusion  250 mL Intravenous Continuous Earnie Larsson, MD   Stopped at 10/07/17 1432  . acetaminophen (TYLENOL) tablet 650 mg  650 mg Oral Q4H PRN Earnie Larsson, MD   650 mg at 10/09/17 1023   Or  . acetaminophen (TYLENOL) suppository 650 mg  650 mg Rectal Q4H PRN Earnie Larsson, MD      .  amLODipine (NORVASC) tablet 5 mg  5 mg Oral Daily Earnie Larsson, MD   5 mg at 10/11/17 0940  . bisacodyl (DULCOLAX) suppository 10 mg  10 mg Rectal Daily PRN Earnie Larsson, MD   10 mg at 10/11/17 0950  . dexamethasone (DECADRON) injection 4 mg  4 mg Intravenous Q6H Earnie Larsson, MD   4 mg at 10/11/17 0549   Or  . dexamethasone (DECADRON) tablet 4 mg  4 mg Oral Q6H Earnie Larsson, MD   4 mg at 10/11/17 1239  . diazepam (VALIUM) tablet 5-10 mg  5-10 mg Oral Q6H PRN Earnie Larsson, MD   10 mg at 10/11/17 0946  . hydrALAZINE (APRESOLINE) injection 10 mg  10 mg Intravenous Q4H PRN Earnie Larsson, MD   10 mg at 10/07/17 1030  . hydrochlorothiazide (HYDRODIURIL) tablet 25 mg  25 mg Oral Daily Earnie Larsson, MD   25 mg at 10/11/17 0940  . HYDROcodone-acetaminophen (NORCO) 10-325 MG per tablet 1 tablet  1 tablet Oral Q4H PRN Earnie Larsson, MD   1 tablet at 10/10/17 2133  . HYDROmorphone (DILAUDID) injection 1 mg  1 mg Intravenous Q2H PRN Earnie Larsson, MD   1 mg at 10/08/17 1218  . insulin aspart (novoLOG) injection 0-15 Units  0-15 Units Subcutaneous TID WC Earnie Larsson, MD   2 Units at 10/11/17 1239  . menthol-cetylpyridinium (CEPACOL) lozenge 3 mg  1 lozenge Oral PRN Earnie Larsson, MD       Or  . phenol (CHLORASEPTIC) mouth spray 1 spray  1 spray Mouth/Throat PRN Earnie Larsson, MD      . metoprolol tartrate (LOPRESSOR) tablet 25 mg  25 mg Oral BID Earnie Larsson, MD   25 mg at 10/11/17 0940  . ondansetron (ZOFRAN) tablet 4 mg  4 mg Oral Q6H PRN Earnie Larsson, MD       Or  . ondansetron [facility]) injection 4 mg  4 mg Intravenous Q6H PRN Earnie Larsson, MD   4 mg at 10/06/17 2347  . oxyCODONE (Oxy IR/ROXICODONE) immediate release tablet 10 mg  10 mg Oral Q3H PRN Earnie Larsson, MD      . polyethylene glycol (MIRALAX / GLYCOLAX) packet 17 g  17 g Oral Daily PRN Earnie Larsson, MD   17 g at 10/09/17 1024  . sodium chloride flush (NS) 0.9 % injection 3 mL  3 mL Intravenous Q12H Earnie Larsson, MD   3 mL at 10/11/17 0950  . sodium chloride flush  (NS) 0.9 % injection 3 mL  3 mL Intravenous PRN Earnie Larsson, MD      . sodium phosphate (FLEET) 7-19 GM/118ML enema 1 enema  1 enema Rectal Once PRN Earnie Larsson, MD        Review of Systems  Constitutional: positive for fatigue Eyes: negative Ears, nose, mouth, throat, and face: negative Respiratory: negative Cardiovascular: negative Gastrointestinal: negative Genitourinary:negative Integument/breast: negative Hematologic/lymphatic: negative Musculoskeletal:positive for back pain Neurological: negative Behavioral/Psych: negative Endocrine: negative Allergic/Immunologic: negative  Physical Exam  KZS:[id number], healthy, no distress, well nourished and well developed SKIN: skin color, texture, turgor are normal, no rashes or significant lesions HEAD: Normocephalic,  No masses, lesions, tenderness or abnormalities EYES: normal, PERRLA, Conjunctiva are pink and non-injected EARS: External ears normal, Canals clear OROPHARYNX:no exudate, no erythema and lips, buccal mucosa, and tongue normal  NECK: supple, no adenopathy, no JVD LYMPH:  no palpable lymphadenopathy, no hepatosplenomegaly BREAST:not examined LUNGS: clear to auscultation , and palpation HEART: regular rate & rhythm, no murmurs and no gallops ABDOMEN:abdomen soft, non-tender, normal bowel sounds and no masses or organomegaly BACK: Back symmetric, no curvature., No CVA tenderness EXTREMITIES:no joint deformities, effusion, or inflammation, no edema  NEURO: alert & oriented x 3 with fluent speech, no focal motor/sensory deficits  PERFORMANCE STATUS: ECOG 1  LABORATORY DATA: Lab Results  Component Value Date   WBC 18.5 (H) 10/07/2017   HGB 9.8 (L) 10/07/2017   HCT 29.9 (L) 10/07/2017   MCV 95.8 10/07/2017   PLT 266 10/07/2017    '@LASTCHEM'$ @  RADIOGRAPHIC STUDIES: Dg Thoracic Spine 2 View  Result Date: 10/06/2017 CLINICAL DATA:  T9 through T11 fusion with pedicle screws and T10 laminectomy. EXAM: THORACIC SPINE 2  VIEWS; DG C-ARM 61-120 MIN COMPARISON:  10/10/2016 CXR FINDINGS: 59 seconds of fluoroscopic time was utilized during T9 through T11 fusion with pedicle screws and T10 laminectomy procedure. Fine bony detail is limited due to the fluoroscopic technique. Three pairs of pedicle screws are noted along the lower thoracic spine skipping 1 level. No hardware failure is identified. An unnamed catheter/probe is also noted with tip projecting over the left upper quadrant of the abdomen. IMPRESSION: Fluoroscopic time utilized for T10 laminectomy with T9 through T11 fusion with pedicle screws. No immediate intraoperative complications are identified. Electronically Signed   By: Ashley Royalty M.D.   On: 10/06/2017 21:19   Dg Lumbar Spine Complete  Result Date: 09/29/2017 CLINICAL DATA:  Low back pain for 1 week, severe pain since 0300 hours this morning, could not get out of bed, former smoker, hypertension EXAM: LUMBAR SPINE - COMPLETE 4+ VIEW COMPARISON:  None FINDINGS: Osseous demineralization. Five non-rib-bearing lumbar vertebra. Facet degenerative changes lower lumbar spine. Disc space narrowing at L4-L5 with minimal anterolisthesis. Mild disc space narrowing at L2-L3. Vertebral body heights maintained without fracture or additional subluxation. No spondylolysis. SI joints preserved. Narrowing of the hip joints bilaterally. Atherosclerotic calcifications aorta. IMPRESSION: Osseous demineralization with degenerative disc and facet disease changes of the lumbar spine. Minimal anterolisthesis at L4-L5. No acute abnormalities. Electronically Signed   By: Lavonia Dana M.D.   On: 09/29/2017 16:45   Mr Lumbar Spine Wo Contrast  Result Date: 10/06/2017 CLINICAL DATA:  Rapidly progressive neurological deficit in the lower extremities. EXAM: MRI LUMBAR SPINE WITHOUT CONTRAST TECHNIQUE: Multiplanar, multisequence MR imaging of the lumbar spine was performed. No intravenous contrast was administered. COMPARISON:  None. FINDINGS:  Segmentation:  Standard. Alignment:  Physiologic. Vertebrae: No discitis or osteomyelitis. Diffuse abnormal T1 hypointense and T2 hyperintense lesions throughout the thoracolumbar spine and pelvis most consistent with malignancy, multiple myeloma versus metastatic disease. Abnormal bone lesion most prominent in the posterior aspect of the T10 vertebral body which extends into the posterior elements with a 9 x 15 x 44 mm posterior epidural soft tissue mass. Pathologic compression fracture of the T10 vertebral body with 6 mm of retropulsion and approximately 50% height loss. Severe multifactorial spinal stenosis and cord compression of the lower thoracic spinal cord resulting from the T10 compression fracture and retropulsion in combination with the posterior epidural tumor. Conus medullaris and cauda equina: Conus extends to the T12 level. Conus and cauda equina appear  normal. Paraspinal and other soft tissues: No acute paraspinal abnormality. Partially visualized right renal cystic mass. Disc levels: Disc spaces: Degenerative disc disease with disc desiccation at L4-5. T12-L1: No significant disc bulge. No evidence of neural foraminal stenosis. No central canal stenosis. L1-L2: No significant disc bulge. No evidence of neural foraminal stenosis. No central canal stenosis. L2-L3: Mild broad-based disc bulge. Mild bilateral facet arthropathy. No evidence of neural foraminal stenosis. No central canal stenosis. L3-L4: Mild broad-based disc bulge. Moderate bilateral facet arthropathy. No evidence of neural foraminal stenosis. No central canal stenosis. L4-L5: Broad-based disc bulge. Moderate bilateral facet arthropathy. No evidence of neural foraminal stenosis. No central canal stenosis. L5-S1: No significant disc bulge. No evidence of neural foraminal stenosis. No central canal stenosis. Moderate bilateral facet arthropathy. IMPRESSION: 1. Abnormal bone lesion most prominent in the posterior aspect of the T10 vertebral  body which extends into the posterior elements with a 9 x 15 x 44 mm posterior epidural soft tissue mass. Pathologic compression fracture of the T10 vertebral body with 6 mm of retropulsion. Severe multifactorial spinal stenosis and cord compression of the lower thoracic spinal cord resulting from the T10 compression fracture and retropulsion in combination with the posterior epidural tumor. 2. Abnormal bone lesions throughout the thoracolumbar spine and pelvis most consistent with malignancy, metastatic disease versus multiple myeloma. Critical Value/emergent results were called by telephone at the time of interpretation on 10/06/2017 at 3:30 pm to Dr. Julianne Rice , who verbally acknowledged these results. Electronically Signed   By: Kathreen Devoid   On: 10/06/2017 15:32   Dg C-arm 1-60 Min  Result Date: 10/06/2017 CLINICAL DATA:  T9 through T11 fusion with pedicle screws and T10 laminectomy. EXAM: THORACIC SPINE 2 VIEWS; DG C-ARM 61-120 MIN COMPARISON:  10/10/2016 CXR FINDINGS: 59 seconds of fluoroscopic time was utilized during T9 through T11 fusion with pedicle screws and T10 laminectomy procedure. Fine bony detail is limited due to the fluoroscopic technique. Three pairs of pedicle screws are noted along the lower thoracic spine skipping 1 level. No hardware failure is identified. An unnamed catheter/probe is also noted with tip projecting over the left upper quadrant of the abdomen. IMPRESSION: Fluoroscopic time utilized for T10 laminectomy with T9 through T11 fusion with pedicle screws. No immediate intraoperative complications are identified. Electronically Signed   By: Ashley Royalty M.D.   On: 10/06/2017 21:19   Dg C-arm 1-60 Min  Result Date: 10/06/2017 CLINICAL DATA:  T9 through T11 fusion with pedicle screws and T10 laminectomy. EXAM: THORACIC SPINE 2 VIEWS; DG C-ARM 61-120 MIN COMPARISON:  10/10/2016 CXR FINDINGS: 59 seconds of fluoroscopic time was utilized during T9 through T11 fusion with  pedicle screws and T10 laminectomy procedure. Fine bony detail is limited due to the fluoroscopic technique. Three pairs of pedicle screws are noted along the lower thoracic spine skipping 1 level. No hardware failure is identified. An unnamed catheter/probe is also noted with tip projecting over the left upper quadrant of the abdomen. IMPRESSION: Fluoroscopic time utilized for T10 laminectomy with T9 through T11 fusion with pedicle screws. No immediate intraoperative complications are identified. Electronically Signed   By: Ashley Royalty M.D.   On: 10/06/2017 21:19    ASSESSMENT: This is a very pleasant 77 years old white female recently diagnosed with likely multiple myeloma presented with large plasmacytoma at the T10 with cord compression status post the 9-10 laminectomy.  The final pathology was consistent with plasma cell neoplasm. The patient has other findings consistent with multiple myeloma  including complaints metabolic panel that showed hyponatremia as well as hyperproteinemia.  CBC also showed persistent anemia.  PLAN: I had a lengthy discussion with the patient today about her current condition and treatment options. I will order several studies for evaluation of her multiple myeloma including myeloma panel. The patient will need bone marrow biopsy and aspirate for evaluation of the bone marrow involvement. The patient will also need to skeletal bone survey. She would benefit from palliative radiotherapy to the T 9-10 resection bed.  She was seen by radiation oncology and expected to start the treatment on October 23, 2017. After completion of her evaluation for the multiple myeloma, I would give the patient the option of receiving her treatment in [place] with me versus [facility] under the care of Dr. Delton Coombes for her convenience. I will arrange for the patient to have a follow-up appointment with me or with Dr. Delton Coombes after discharge from the hospital. The patient was  advised to call immediately if she has any concerning symptoms in the interval. The patient voices understanding of current disease status and treatment options and is in agreement with the current care plan.  All questions were answered. The patient knows to call the clinic with any problems, questions or concerns. We can certainly see the patient much sooner if necessary.  Thank you so much for allowing me to participate in the care of Jamie Ewing. I will continue to follow up the patient with you and assist in her care.  Disclaimer: This note was dictated with voice recognition software. Similar sounding words can inadvertently be transcribed and may not be corrected upon review.   Eilleen Kempf October 11, 2017, 5:17 PM

## 2017-10-11 NOTE — Discharge Summary (Addendum)
Physician Discharge Summary  Patient ID: Jamie Ewing MRN: [id number] DOB/AGE: [birth year] 77 y.o.  Admit date: 10/06/2017 Discharge date: 10/11/2017  Admission Diagnoses:  Discharge Diagnoses:  Active Problems:   Status post surgery   Thoracic spine tumor   Spinal stenosis of thoracic region   Thoracic stenosis   Discharged Condition: good  Hospital Course: Patient admitted to the hospital and emergently underwent decompression and fusion surgery for treatment of a T10 pathologic fracture with associated epidural tumor.  Tumor consistent with plasmacytoma.  Consult to radiation oncology has been made and plan is for radiation treatment to the operative bed over the next couple weeks.  Medical oncology consult also made.  Postop the patient is done very well.  Preoperative weakness and sensory loss are very much improved.  She is standing and walking with minimal assistance.  She is continent of urine and stool.  She is having minimal pain.  Plan is for discharge to skilled nursing facility for further convalescence.  Consults:   Significant Diagnostic Studies:   Treatments:   Discharge Exam: Blood pressure (!) 153/78, pulse 72, temperature 98.5 F (36.9 C), temperature source Oral, resp. rate 18, height 5\' 3"  (1.6 m), weight 83.7 kg (184 lb 8.4 oz), SpO2 98 %. Awake and alert.  Oriented and appropriate.  Cranial nerve function intact.  Motor examination 5/5 bilateral.  Sensory examination with minimal distal sensory loss in both lower extremities.  Wound clean and dry.  Chest and abdomen benign.  Disposition: Discharge disposition: 03-Skilled Nursing Facility        Allergies as of 10/11/2017   No Known Allergies     Medication List    STOP taking these medications   cyclobenzaprine 5 MG tablet Commonly known as:  FLEXERIL   HYDROcodone-acetaminophen 5-325 MG tablet Commonly known as:  NORCO/VICODIN   naproxen 500 MG tablet Commonly known as:  NAPROSYN    predniSONE 10 MG tablet Commonly known as:  DELTASONE     TAKE these medications   acetaminophen 650 MG CR tablet Commonly known as:  TYLENOL Take 1,300 mg by mouth every 8 (eight) hours as needed for pain.   amLODipine 5 MG tablet Commonly known as:  NORVASC Take 5 mg by mouth daily.   diazepam 5 MG tablet Commonly known as:  VALIUM Take 1-2 tablets (5-10 mg total) by mouth every 6 (six) hours as needed for muscle spasms.   hydrochlorothiazide 25 MG tablet Commonly known as:  HYDRODIURIL Take 25 mg by mouth daily.   ondansetron 4 MG tablet Commonly known as:  ZOFRAN Take 1 tablet (4 mg total) by mouth every 8 (eight) hours as needed for nausea or vomiting.   Oxycodone HCl 10 MG Tabs Take 1 tablet (10 mg total) by mouth every 3 (three) hours as needed for severe pain ((score 7 to 10)).            Durable Medical Equipment  (From admission, onward)        Start     Ordered   10/06/17 2304  DME Walker rolling  Once    Question:  Patient needs a walker to treat with the following condition  Answer:  Thoracic spine tumor   10/06/17 2304   10/06/17 2304  DME 3 n 1  Once     10/06/17 2304     Contact information for after-discharge care    Destination    [facility] .   Service:  Skilled Nursing Contact information: [phone]  [place] [phone]              Signed: Charlie Pitter 10/11/2017, 11:54 AM

## 2017-10-11 NOTE — Progress Notes (Signed)
Stable.  Ready for discharge.

## 2017-10-12 ENCOUNTER — Inpatient Hospital Stay (HOSPITAL_COMMUNITY): Payer: Medicare HMO

## 2017-10-12 DIAGNOSIS — Z79899 Other long term (current) drug therapy: Secondary | ICD-10-CM | POA: Diagnosis not present

## 2017-10-12 DIAGNOSIS — D72829 Elevated white blood cell count, unspecified: Secondary | ICD-10-CM | POA: Diagnosis not present

## 2017-10-12 DIAGNOSIS — R634 Abnormal weight loss: Secondary | ICD-10-CM | POA: Diagnosis not present

## 2017-10-12 DIAGNOSIS — M546 Pain in thoracic spine: Secondary | ICD-10-CM | POA: Diagnosis not present

## 2017-10-12 DIAGNOSIS — K5900 Constipation, unspecified: Secondary | ICD-10-CM | POA: Diagnosis not present

## 2017-10-12 DIAGNOSIS — M8458XA Pathological fracture in neoplastic disease, other specified site, initial encounter for fracture: Secondary | ICD-10-CM | POA: Diagnosis not present

## 2017-10-12 DIAGNOSIS — Z743 Need for continuous supervision: Secondary | ICD-10-CM | POA: Diagnosis not present

## 2017-10-12 DIAGNOSIS — C7951 Secondary malignant neoplasm of bone: Secondary | ICD-10-CM | POA: Diagnosis not present

## 2017-10-12 DIAGNOSIS — I10 Essential (primary) hypertension: Secondary | ICD-10-CM | POA: Diagnosis not present

## 2017-10-12 DIAGNOSIS — S22070S Wedge compression fracture of T9-T10 vertebra, sequela: Secondary | ICD-10-CM | POA: Diagnosis not present

## 2017-10-12 DIAGNOSIS — M5489 Other dorsalgia: Secondary | ICD-10-CM | POA: Diagnosis not present

## 2017-10-12 DIAGNOSIS — Z87891 Personal history of nicotine dependence: Secondary | ICD-10-CM | POA: Diagnosis not present

## 2017-10-12 DIAGNOSIS — Z923 Personal history of irradiation: Secondary | ICD-10-CM | POA: Diagnosis not present

## 2017-10-12 DIAGNOSIS — R200 Anesthesia of skin: Secondary | ICD-10-CM | POA: Diagnosis not present

## 2017-10-12 DIAGNOSIS — M4804 Spinal stenosis, thoracic region: Secondary | ICD-10-CM | POA: Diagnosis not present

## 2017-10-12 DIAGNOSIS — Z510 Encounter for antineoplastic radiation therapy: Secondary | ICD-10-CM | POA: Diagnosis not present

## 2017-10-12 DIAGNOSIS — R531 Weakness: Secondary | ICD-10-CM | POA: Diagnosis not present

## 2017-10-12 DIAGNOSIS — R262 Difficulty in walking, not elsewhere classified: Secondary | ICD-10-CM | POA: Diagnosis not present

## 2017-10-12 DIAGNOSIS — R279 Unspecified lack of coordination: Secondary | ICD-10-CM | POA: Diagnosis not present

## 2017-10-12 DIAGNOSIS — M6281 Muscle weakness (generalized): Secondary | ICD-10-CM | POA: Diagnosis not present

## 2017-10-12 DIAGNOSIS — C720 Malignant neoplasm of spinal cord: Secondary | ICD-10-CM | POA: Diagnosis not present

## 2017-10-12 DIAGNOSIS — D4989 Neoplasm of unspecified behavior of other specified sites: Secondary | ICD-10-CM | POA: Diagnosis not present

## 2017-10-12 DIAGNOSIS — C9030 Solitary plasmacytoma not having achieved remission: Secondary | ICD-10-CM | POA: Diagnosis not present

## 2017-10-12 DIAGNOSIS — C9000 Multiple myeloma not having achieved remission: Secondary | ICD-10-CM | POA: Diagnosis not present

## 2017-10-12 DIAGNOSIS — R52 Pain, unspecified: Secondary | ICD-10-CM | POA: Diagnosis not present

## 2017-10-12 DIAGNOSIS — Z5112 Encounter for antineoplastic immunotherapy: Secondary | ICD-10-CM | POA: Diagnosis not present

## 2017-10-12 DIAGNOSIS — Z4789 Encounter for other orthopedic aftercare: Secondary | ICD-10-CM | POA: Diagnosis not present

## 2017-10-12 LAB — PROTIME-INR
INR: 1.14
PROTHROMBIN TIME: 14.5 s (ref 11.4–15.2)

## 2017-10-12 LAB — CBC WITH DIFFERENTIAL/PLATELET
ABS IMMATURE GRANULOCYTES: 0.1 10*3/uL (ref 0.0–0.1)
BASOS ABS: 0 10*3/uL (ref 0.0–0.1)
BASOS PCT: 0 %
Eosinophils Absolute: 0 10*3/uL (ref 0.0–0.7)
Eosinophils Relative: 0 %
HCT: 31.2 % — ABNORMAL LOW (ref 36.0–46.0)
Hemoglobin: 10.3 g/dL — ABNORMAL LOW (ref 12.0–15.0)
IMMATURE GRANULOCYTES: 1 %
Lymphocytes Relative: 13 %
Lymphs Abs: 1.9 10*3/uL (ref 0.7–4.0)
MCH: 31.4 pg (ref 26.0–34.0)
MCHC: 33 g/dL (ref 30.0–36.0)
MCV: 95.1 fL (ref 78.0–100.0)
Monocytes Absolute: 1.2 10*3/uL — ABNORMAL HIGH (ref 0.1–1.0)
Monocytes Relative: 8 %
NEUTROS ABS: 11.8 10*3/uL — AB (ref 1.7–7.7)
NEUTROS PCT: 78 %
PLATELETS: 260 10*3/uL (ref 150–400)
RBC: 3.28 MIL/uL — ABNORMAL LOW (ref 3.87–5.11)
RDW: 13.7 % (ref 11.5–15.5)
WBC: 15 10*3/uL — AB (ref 4.0–10.5)

## 2017-10-12 LAB — GLUCOSE, CAPILLARY
GLUCOSE-CAPILLARY: 154 mg/dL — AB (ref 70–99)
GLUCOSE-CAPILLARY: 127 mg/dL — AB (ref 70–99)

## 2017-10-12 MED ORDER — FENTANYL CITRATE (PF) 100 MCG/2ML IJ SOLN
INTRAMUSCULAR | Status: AC | PRN
  Administered 2017-10-12: 25 ug via INTRAVENOUS

## 2017-10-12 MED ORDER — MIDAZOLAM HCL 2 MG/2ML IJ SOLN
INTRAMUSCULAR | Status: AC
  Filled 2017-10-12: qty 2

## 2017-10-12 MED ORDER — LIDOCAINE HCL 1 % IJ SOLN
INTRAMUSCULAR | Status: AC
  Filled 2017-10-12: qty 20

## 2017-10-12 MED ORDER — MIDAZOLAM HCL 2 MG/2ML IJ SOLN
INTRAMUSCULAR | Status: AC | PRN
  Administered 2017-10-12: 1 mg via INTRAVENOUS

## 2017-10-12 MED ORDER — FENTANYL CITRATE (PF) 100 MCG/2ML IJ SOLN
INTRAMUSCULAR | Status: AC
  Filled 2017-10-12: qty 2

## 2017-10-12 NOTE — Procedures (Signed)
CT guided bone marrow biopsy from right ilium.  2 aspirates and 2 cores obtained.  Minimal blood loss and no immediate complication.

## 2017-10-12 NOTE — Consult Note (Signed)
Chief Complaint: Patient was seen in consultation today for Bone marrow biopsy Chief Complaint  Patient presents with  . Numbness   at the request of Dr Mayme Genta   Supervising Physician: Markus Daft  Patient Status: [facility]  History of Present Illness: Jamie Ewing is a 77 y.o. female   Low back pain for weeks Work up for LBP did reveal some degenerative and disc disease And bony lesions  MRI 10/06/17:  IMPRESSION: 1. Abnormal bone lesion most prominent in the posterior aspect of the T10 vertebral body which extends into the posterior elements with a 9 x 15 x 44 mm posterior epidural soft tissue mass. Pathologic compression fracture of the T10 vertebral body with 6 mm of retropulsion. Severe multifactorial spinal stenosis and cord compression of the lower thoracic spinal cord resulting from the T10 compression fracture and retropulsion in combination with the posterior epidural tumor. 2. Abnormal bone lesions throughout the thoracolumbar spine and pelvis most consistent with malignancy, metastatic disease versus multiple myeloma  T9-10 laminectomy with Dr Annette Stable 10/06/17 The final pathology ([id number]) showed plasma cell neoplasm.  Immunohistochemistry is positive for CD 138 and plasma cells are kappa restricted by light chain in situ hybridization.  The findings are consistent with involvement by plasma cell neoplasm.  Findings consistent with Multiple myeloma Dr Julien Nordmann has seen and examined pt asking for bone marrow biopsy   Past Medical History:  Diagnosis Date  . Back pain   . Hypercholesteremia   . Hypertension     Past Surgical History:  Procedure Laterality Date  . LAMINECTOMY N/A 10/06/2017   Procedure: THORACIC NINE AND TEN LAMINECTOMY WITH RESECTION OF TUMOR, THORACIC EIGHT TO THORACIC ELEVEN FUSION WITH PEDICLE SCREW FIXATION;  Surgeon: Earnie Larsson, MD;  Location: [facility];  Service: Neurosurgery;  Laterality: N/A;  . TOTAL KNEE ARTHROPLASTY  2001      Allergies: Patient has no known allergies.  Medications: Prior to Admission medications   Medication Sig Start Date End Date Taking? Authorizing Provider  acetaminophen (TYLENOL) 650 MG CR tablet Take 1,300 mg by mouth every 8 (eight) hours as needed for pain.   Yes [provider]  amLODipine (NORVASC) 5 MG tablet Take 5 mg by mouth daily.  05/13/11  Yes [provider]  cyclobenzaprine (FLEXERIL) 5 MG tablet Take 1 or 2 po Q 6hrs for muscle spasms 10/06/17  Yes Rolland Porter, MD  hydrochlorothiazide (HYDRODIURIL) 25 MG tablet Take 25 mg by mouth daily.   Yes [provider]  naproxen (NAPROSYN) 500 MG tablet Take 500 mg by mouth 2 (two) times daily with a meal.   Yes [provider]  ondansetron (ZOFRAN) 4 MG tablet Take 1 tablet (4 mg total) by mouth every 8 (eight) hours as needed for nausea or vomiting. 10/06/17  Yes Rolland Porter, MD  predniSONE (DELTASONE) 10 MG tablet 2 tabs daily for 4 days, then 1 tab daily for 4 days 09/29/17  Yes Triplett, Tammy, PA-C  diazepam (VALIUM) 5 MG tablet Take 1-2 tablets (5-10 mg total) by mouth every 6 (six) hours as needed for muscle spasms. 10/11/17   Earnie Larsson, MD  HYDROcodone-acetaminophen (NORCO/VICODIN) 5-325 MG tablet Take one tab po q 4 hrs prn pain Patient not taking: Reported on 10/06/2017 09/29/17   Triplett, Tammy, PA-C  oxyCODONE 10 MG TABS Take 1 tablet (10 mg total) by mouth every 3 (three) hours as needed for severe pain ((score 7 to 10)). 10/11/17   Earnie Larsson, MD  History reviewed. No pertinent family history.  Social History   Socioeconomic History  . Marital status: Widowed    Spouse name: Not on file  . Number of children: Not on file  . Years of education: Not on file  . Highest education level: Not on file  Occupational History  . Not on file  Social Needs  . Financial resource strain: Not on file  . Food insecurity:    Worry: Not on file    Inability: Not on file  . Transportation  needs:    Medical: Not on file    Non-medical: Not on file  Tobacco Use  . Smoking status: Former Smoker    Types: Cigarettes    Last attempt to quit: 05/31/2006    Years since quitting: 11.3  . Smokeless tobacco: Never Used  Substance and Sexual Activity  . Alcohol use: Yes    Comment: wine occassionally  . Drug use: No  . Sexual activity: Not on file  Lifestyle  . Physical activity:    Days per week: Not on file    Minutes per session: Not on file  . Stress: Not on file  Relationships  . Social connections:    Talks on phone: Not on file    Gets together: Not on file    Attends religious service: Not on file    Active member of club or organization: Not on file    Attends meetings of clubs or organizations: Not on file    Relationship status: Not on file  Other Topics Concern  . Not on file  Social History Narrative  . Not on file    Review of Systems: A 12 point ROS discussed and pertinent positives are indicated in the HPI above.  All other systems are negative.  Review of Systems  Constitutional: Positive for activity change and fatigue. Negative for appetite change and fever.  Eyes: Negative for visual disturbance.  Respiratory: Negative for cough and shortness of breath.   Cardiovascular: Negative for chest pain.  Gastrointestinal: Negative for abdominal pain.  Musculoskeletal: Positive for back pain and gait problem.  Neurological: Positive for weakness.  Psychiatric/Behavioral: Negative for behavioral problems and confusion.    Vital Signs: BP (!) 144/69   Pulse 78   Temp 98.1 F (36.7 C) (Oral)   Resp 18   Ht '5\' 3"'$  (1.6 m)   Wt 184 lb 8.4 oz (83.7 kg)   SpO2 97%   BMI 32.69 kg/m   Physical Exam  Constitutional: She is oriented to person, place, and time.  Cardiovascular: Normal rate, regular rhythm and normal heart sounds.  Pulmonary/Chest: Effort normal and breath sounds normal.  Abdominal: Soft. Bowel sounds are normal.  Musculoskeletal:  Normal range of motion.  Weakness is lowe extr-- Surgery 7/03/29/17  Neurological: She is alert and oriented to person, place, and time.  Skin: Skin is warm and dry.  Psychiatric: She has a normal mood and affect. Her behavior is normal. Judgment and thought content normal.  Nursing note and vitals reviewed.   Imaging: Dg Thoracic Spine 2 View  Result Date: 10/06/2017 CLINICAL DATA:  T9 through T11 fusion with pedicle screws and T10 laminectomy. EXAM: THORACIC SPINE 2 VIEWS; DG C-ARM 61-120 MIN COMPARISON:  10/10/2016 CXR FINDINGS: 59 seconds of fluoroscopic time was utilized during T9 through T11 fusion with pedicle screws and T10 laminectomy procedure. Fine bony detail is limited due to the fluoroscopic technique. Three pairs of pedicle screws are noted along the lower thoracic spine  skipping 1 level. No hardware failure is identified. An unnamed catheter/probe is also noted with tip projecting over the left upper quadrant of the abdomen. IMPRESSION: Fluoroscopic time utilized for T10 laminectomy with T9 through T11 fusion with pedicle screws. No immediate intraoperative complications are identified. Electronically Signed   By: Ashley Royalty M.D.   On: 10/06/2017 21:19   Dg Lumbar Spine Complete  Result Date: 09/29/2017 CLINICAL DATA:  Low back pain for 1 week, severe pain since 0300 hours this morning, could not get out of bed, former smoker, hypertension EXAM: LUMBAR SPINE - COMPLETE 4+ VIEW COMPARISON:  None FINDINGS: Osseous demineralization. Five non-rib-bearing lumbar vertebra. Facet degenerative changes lower lumbar spine. Disc space narrowing at L4-L5 with minimal anterolisthesis. Mild disc space narrowing at L2-L3. Vertebral body heights maintained without fracture or additional subluxation. No spondylolysis. SI joints preserved. Narrowing of the hip joints bilaterally. Atherosclerotic calcifications aorta. IMPRESSION: Osseous demineralization with degenerative disc and facet disease changes  of the lumbar spine. Minimal anterolisthesis at L4-L5. No acute abnormalities. Electronically Signed   By: Lavonia Dana M.D.   On: 09/29/2017 16:45   Mr Lumbar Spine Wo Contrast  Result Date: 10/06/2017 CLINICAL DATA:  Rapidly progressive neurological deficit in the lower extremities. EXAM: MRI LUMBAR SPINE WITHOUT CONTRAST TECHNIQUE: Multiplanar, multisequence MR imaging of the lumbar spine was performed. No intravenous contrast was administered. COMPARISON:  None. FINDINGS: Segmentation:  Standard. Alignment:  Physiologic. Vertebrae: No discitis or osteomyelitis. Diffuse abnormal T1 hypointense and T2 hyperintense lesions throughout the thoracolumbar spine and pelvis most consistent with malignancy, multiple myeloma versus metastatic disease. Abnormal bone lesion most prominent in the posterior aspect of the T10 vertebral body which extends into the posterior elements with a 9 x 15 x 44 mm posterior epidural soft tissue mass. Pathologic compression fracture of the T10 vertebral body with 6 mm of retropulsion and approximately 50% height loss. Severe multifactorial spinal stenosis and cord compression of the lower thoracic spinal cord resulting from the T10 compression fracture and retropulsion in combination with the posterior epidural tumor. Conus medullaris and cauda equina: Conus extends to the T12 level. Conus and cauda equina appear normal. Paraspinal and other soft tissues: No acute paraspinal abnormality. Partially visualized right renal cystic mass. Disc levels: Disc spaces: Degenerative disc disease with disc desiccation at L4-5. T12-L1: No significant disc bulge. No evidence of neural foraminal stenosis. No central canal stenosis. L1-L2: No significant disc bulge. No evidence of neural foraminal stenosis. No central canal stenosis. L2-L3: Mild broad-based disc bulge. Mild bilateral facet arthropathy. No evidence of neural foraminal stenosis. No central canal stenosis. L3-L4: Mild broad-based disc  bulge. Moderate bilateral facet arthropathy. No evidence of neural foraminal stenosis. No central canal stenosis. L4-L5: Broad-based disc bulge. Moderate bilateral facet arthropathy. No evidence of neural foraminal stenosis. No central canal stenosis. L5-S1: No significant disc bulge. No evidence of neural foraminal stenosis. No central canal stenosis. Moderate bilateral facet arthropathy. IMPRESSION: 1. Abnormal bone lesion most prominent in the posterior aspect of the T10 vertebral body which extends into the posterior elements with a 9 x 15 x 44 mm posterior epidural soft tissue mass. Pathologic compression fracture of the T10 vertebral body with 6 mm of retropulsion. Severe multifactorial spinal stenosis and cord compression of the lower thoracic spinal cord resulting from the T10 compression fracture and retropulsion in combination with the posterior epidural tumor. 2. Abnormal bone lesions throughout the thoracolumbar spine and pelvis most consistent with malignancy, metastatic disease versus multiple myeloma. Critical Value/emergent results  were called by telephone at the time of interpretation on 10/06/2017 at 3:30 pm to Dr. Julianne Rice , who verbally acknowledged these results. Electronically Signed   By: Kathreen Devoid   On: 10/06/2017 15:32   Dg C-arm 1-60 Min  Result Date: 10/06/2017 CLINICAL DATA:  T9 through T11 fusion with pedicle screws and T10 laminectomy. EXAM: THORACIC SPINE 2 VIEWS; DG C-ARM 61-120 MIN COMPARISON:  10/10/2016 CXR FINDINGS: 59 seconds of fluoroscopic time was utilized during T9 through T11 fusion with pedicle screws and T10 laminectomy procedure. Fine bony detail is limited due to the fluoroscopic technique. Three pairs of pedicle screws are noted along the lower thoracic spine skipping 1 level. No hardware failure is identified. An unnamed catheter/probe is also noted with tip projecting over the left upper quadrant of the abdomen. IMPRESSION: Fluoroscopic time utilized for  T10 laminectomy with T9 through T11 fusion with pedicle screws. No immediate intraoperative complications are identified. Electronically Signed   By: Ashley Royalty M.D.   On: 10/06/2017 21:19   Dg C-arm 1-60 Min  Result Date: 10/06/2017 CLINICAL DATA:  T9 through T11 fusion with pedicle screws and T10 laminectomy. EXAM: THORACIC SPINE 2 VIEWS; DG C-ARM 61-120 MIN COMPARISON:  10/10/2016 CXR FINDINGS: 59 seconds of fluoroscopic time was utilized during T9 through T11 fusion with pedicle screws and T10 laminectomy procedure. Fine bony detail is limited due to the fluoroscopic technique. Three pairs of pedicle screws are noted along the lower thoracic spine skipping 1 level. No hardware failure is identified. An unnamed catheter/probe is also noted with tip projecting over the left upper quadrant of the abdomen. IMPRESSION: Fluoroscopic time utilized for T10 laminectomy with T9 through T11 fusion with pedicle screws. No immediate intraoperative complications are identified. Electronically Signed   By: Ashley Royalty M.D.   On: 10/06/2017 21:19    Labs:  CBC: Recent Labs    10/06/17 1352 10/07/17 0258  WBC 11.5* 18.5*  HGB 11.5* 9.8*  HCT 34.8* 29.9*  PLT 281 266    COAGS: No results for input(s): INR, APTT in the last 8760 hours.  BMP: Recent Labs    10/06/17 1352 10/07/17 0258  NA 130* 129*  K 3.8 3.9  CL 94* 93*  CO2 24 20*  GLUCOSE 122* 207*  BUN 19 14  CALCIUM 9.7 9.6  CREATININE 0.77 0.88  GFRNONAA >60 >60  GFRAA >60 >60    LIVER FUNCTION TESTS: Recent Labs    10/06/17 1352  BILITOT 0.5  AST 20  ALT 26  ALKPHOS 121  PROT 9.9*  ALBUMIN 3.5    TUMOR MARKERS: No results for input(s): AFPTM, CEA, CA199, CHROMGRNA in the last 8760 hours.  Assessment and Plan:  LBP T9-10 laminectomy 10/06/17 Pathology shows and imaging worrisome for Multiple myeloma Now scheduled for bone marrow biopsy Risks and benefits discussed with the patient including, but not limited to  bleeding, infection, damage to adjacent structures or low yield requiring additional tests.  All of the patient's questions were answered, patient is agreeable to proceed. Consent signed and in chart.   Thank you for this interesting consult.  I greatly enjoyed meeting Jamie Ewing and look forward to participating in their care.  A copy of this report was sent to the requesting provider on this date.  Electronically Signed: Lavonia Drafts, PA-C 10/12/2017, 6:55 AM   I spent a total of 20 Minutes    in face to face in clinical consultation, greater than 50% of which was counseling/coordinating  care for Bone marrow bx

## 2017-10-12 NOTE — Social Work (Addendum)
Continuing to follow, when pt Jamie Ewing is received will d/c to [facility].    11:18pm: Auth received, [facility] packet on chart, script in discharge folder. Clinical Social Worker facilitated patient discharge including contacting patient family and facility to confirm patient discharge plans.  Clinical information faxed to facility and family agreeable with plan.  CSW arranged ambulance transport via [facility] to [place] @2pm . RN to call [phone] with report  prior to discharge.  Clinical Social Worker will sign off for now as social work intervention is no longer needed. Please consult [place] again if new need arises.  Alexander Mt, [place] Social Worker [phone]

## 2017-10-12 NOTE — Clinical Social Work Placement (Addendum)
   CLINICAL SOCIAL WORK PLACEMENT  NOTE [name] RN to call report to [phone]  Date:  10/12/2017  Patient Details  Name: Jamie Ewing MRN: [id number] Date of Birth: 17-Aug-1940  Clinical Social Work is seeking post-discharge placement for this patient at the [place] level of care (*CSW will initial, date and re-position this form in  chart as items are completed):  Yes   Patient/family provided with [facility] Work Department's list of facilities offering this level of care within the geographic area requested by the patient (or if unable, by the patient's family).  Yes   Patient/family informed of their freedom to choose among providers that offer the needed level of care, that participate in Medicare, Medicaid or managed care program needed by the patient, have an available bed and are willing to accept the patient.  Yes   Patient/family informed of [facility]'s ownership interest in [facility] and [facility], as well as of the fact that they are under no obligation to receive care at these facilities.  PASRR submitted to EDS on       PASRR number received on 10/09/17     Existing PASRR number confirmed on       FL2 transmitted to all facilities in geographic area requested by pt/family on 10/09/17     FL2 transmitted to all facilities within larger geographic area on       Patient informed that his/her managed care company has contracts with or will negotiate with certain facilities, including the following:        Yes   Patient/family informed of bed offers received.  Patient chooses bed at [place] [facility]     Physician recommends and patient chooses bed at      Patient to be transferred to [facility] [place] on  .  Patient to be transferred to facility by [facility]     Patient family notified on  10/12/2017 of transfer.  Name of family member notified:  daughter, Joelene Millin      PHYSICIAN       Additional Comment:    _______________________________________________ Alexander Mt, [place] 10/12/2017, 12:03 PM

## 2017-10-12 NOTE — Care Management Important Message (Signed)
Important Message  Patient Details  Name: Jamie Ewing MRN: [id number] Date of Birth: 1940-12-12   Medicare Important Message Given:  Yes    [name] 10/12/2017, 1:44 PM

## 2017-10-12 NOTE — Care Management Note (Signed)
Case Management Note  Patient Details  Name: ANALYCE TAVARES MRN: [id number] Date of Birth: [birth year]  Subjective/Objective: Pt presented for Spinal Stenosis- s/p decompression. CSW following for transition to SNF- Authorization approved for SNF at [facility].                  Action/Plan: No needs from CM at this time.   Expected Discharge Date:  10/12/17               Expected Discharge Plan:  Skilled Nursing Facility  In-House Referral:  Clinical Social Work  Discharge planning Services  CM Consult  Post Acute Care Choice:  NA Choice offered to:  NA  DME Arranged:  N/A DME Agency:  NA  HH Arranged:  NA HH Agency:  NA  Status of Service:  Completed, signed off  If discussed at [employer] of Stay Meetings, dates discussed:    Additional Comments:  Bethena Roys, RN 10/12/2017, 10:16 AM

## 2017-10-13 LAB — IGG, IGA, IGM
IGA: 2833 mg/dL — AB (ref 64–422)
IGG (IMMUNOGLOBIN G), SERUM: 612 mg/dL — AB (ref 700–1600)
IgM (Immunoglobulin M), Srm: 19 mg/dL — ABNORMAL LOW (ref 26–217)

## 2017-10-13 LAB — KAPPA/LAMBDA LIGHT CHAINS
KAPPA FREE LGHT CHN: 12.2 mg/L (ref 3.3–19.4)
Kappa, lambda light chain ratio: 1.69 — ABNORMAL HIGH (ref 0.26–1.65)
LAMDA FREE LIGHT CHAINS: 7.2 mg/L (ref 5.7–26.3)

## 2017-10-13 LAB — BETA 2 MICROGLOBULIN, SERUM: Beta-2 Microglobulin: 1.4 mg/L (ref 0.6–2.4)

## 2017-10-16 DIAGNOSIS — M4804 Spinal stenosis, thoracic region: Secondary | ICD-10-CM | POA: Diagnosis not present

## 2017-10-16 DIAGNOSIS — I10 Essential (primary) hypertension: Secondary | ICD-10-CM | POA: Diagnosis not present

## 2017-10-16 DIAGNOSIS — K5900 Constipation, unspecified: Secondary | ICD-10-CM | POA: Diagnosis not present

## 2017-10-16 LAB — MULTIPLE MYELOMA PANEL, SERUM
ALBUMIN/GLOB SERPL: 0.7 (ref 0.7–1.7)
ALPHA 1: 0.3 g/dL (ref 0.0–0.4)
ALPHA2 GLOB SERPL ELPH-MCNC: 1.3 g/dL — AB (ref 0.4–1.0)
Albumin SerPl Elph-Mcnc: 3.3 g/dL (ref 2.9–4.4)
B-GLOBULIN SERPL ELPH-MCNC: 2.8 g/dL — AB (ref 0.7–1.3)
GLOBULIN, TOTAL: 4.9 g/dL — AB (ref 2.2–3.9)
Gamma Glob SerPl Elph-Mcnc: 0.6 g/dL (ref 0.4–1.8)
IGM (IMMUNOGLOBULIN M), SRM: 18 mg/dL — AB (ref 26–217)
IgA: 2745 mg/dL — ABNORMAL HIGH (ref 64–422)
IgG (Immunoglobin G), Serum: 599 mg/dL — ABNORMAL LOW (ref 700–1600)
M PROTEIN SERPL ELPH-MCNC: 2.1 g/dL — AB
Total Protein ELP: 8.2 g/dL (ref 6.0–8.5)

## 2017-10-17 DIAGNOSIS — M4804 Spinal stenosis, thoracic region: Secondary | ICD-10-CM | POA: Diagnosis not present

## 2017-10-17 DIAGNOSIS — K5900 Constipation, unspecified: Secondary | ICD-10-CM | POA: Diagnosis not present

## 2017-10-17 DIAGNOSIS — I10 Essential (primary) hypertension: Secondary | ICD-10-CM | POA: Diagnosis not present

## 2017-10-18 DIAGNOSIS — M4804 Spinal stenosis, thoracic region: Secondary | ICD-10-CM | POA: Diagnosis not present

## 2017-10-18 DIAGNOSIS — C720 Malignant neoplasm of spinal cord: Secondary | ICD-10-CM | POA: Diagnosis not present

## 2017-10-18 DIAGNOSIS — K5900 Constipation, unspecified: Secondary | ICD-10-CM | POA: Diagnosis not present

## 2017-10-19 ENCOUNTER — Inpatient Hospital Stay (HOSPITAL_COMMUNITY): Payer: Medicare HMO | Attending: Hematology | Admitting: Hematology

## 2017-10-19 ENCOUNTER — Other Ambulatory Visit: Payer: Self-pay

## 2017-10-19 ENCOUNTER — Inpatient Hospital Stay (HOSPITAL_COMMUNITY): Payer: Medicare HMO

## 2017-10-19 ENCOUNTER — Encounter (HOSPITAL_COMMUNITY): Payer: Self-pay | Admitting: Hematology

## 2017-10-19 DIAGNOSIS — Z5112 Encounter for antineoplastic immunotherapy: Secondary | ICD-10-CM | POA: Insufficient documentation

## 2017-10-19 DIAGNOSIS — Z79899 Other long term (current) drug therapy: Secondary | ICD-10-CM | POA: Insufficient documentation

## 2017-10-19 DIAGNOSIS — R200 Anesthesia of skin: Secondary | ICD-10-CM | POA: Insufficient documentation

## 2017-10-19 DIAGNOSIS — C9000 Multiple myeloma not having achieved remission: Secondary | ICD-10-CM | POA: Diagnosis not present

## 2017-10-19 DIAGNOSIS — K5900 Constipation, unspecified: Secondary | ICD-10-CM | POA: Insufficient documentation

## 2017-10-19 DIAGNOSIS — Z923 Personal history of irradiation: Secondary | ICD-10-CM | POA: Insufficient documentation

## 2017-10-19 DIAGNOSIS — R634 Abnormal weight loss: Secondary | ICD-10-CM

## 2017-10-19 DIAGNOSIS — Z7189 Other specified counseling: Secondary | ICD-10-CM | POA: Insufficient documentation

## 2017-10-19 DIAGNOSIS — Z87891 Personal history of nicotine dependence: Secondary | ICD-10-CM | POA: Insufficient documentation

## 2017-10-19 DIAGNOSIS — Z01810 Encounter for preprocedural cardiovascular examination: Secondary | ICD-10-CM | POA: Insufficient documentation

## 2017-10-19 LAB — CBC WITH DIFFERENTIAL/PLATELET
BASOS PCT: 0 %
Basophils Absolute: 0 10*3/uL (ref 0.0–0.1)
EOS ABS: 0.1 10*3/uL (ref 0.0–0.7)
Eosinophils Relative: 1 %
HEMATOCRIT: 28.8 % — AB (ref 36.0–46.0)
HEMOGLOBIN: 9.6 g/dL — AB (ref 12.0–15.0)
Lymphocytes Relative: 11 %
Lymphs Abs: 1.7 10*3/uL (ref 0.7–4.0)
MCH: 32 pg (ref 26.0–34.0)
MCHC: 33.3 g/dL (ref 30.0–36.0)
MCV: 96 fL (ref 78.0–100.0)
Monocytes Absolute: 1.5 10*3/uL — ABNORMAL HIGH (ref 0.1–1.0)
Monocytes Relative: 10 %
NEUTROS ABS: 12 10*3/uL — AB (ref 1.7–7.7)
NEUTROS PCT: 78 %
Platelets: 236 10*3/uL (ref 150–400)
RBC: 3 MIL/uL — AB (ref 3.87–5.11)
RDW: 14.8 % (ref 11.5–15.5)
WBC: 15.3 10*3/uL — ABNORMAL HIGH (ref 4.0–10.5)

## 2017-10-19 LAB — URIC ACID: Uric Acid, Serum: 6 mg/dL (ref 2.5–7.1)

## 2017-10-19 LAB — LACTATE DEHYDROGENASE: LDH: 205 U/L — AB (ref 98–192)

## 2017-10-19 NOTE — Progress Notes (Signed)
CONSULT NOTE  Patient Care Team: Asencion Noble, MD as PCP - General (Internal Medicine)  CHIEF COMPLAINTS/PURPOSE OF CONSULTATION:  Multiple myeloma.  HISTORY OF PRESENTING ILLNESS:  Jamie Ewing 77 y.o. female is seen in consultation today for further work-up and management of multiple myeloma.  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  As her pain got worse, she came to the ER on 09/29/2017.  X-ray of the spine did not reveal any problems other than degenerative disc disease.  She again presented to the ER on 10/06/2017 with severe pain, weakness in the legs and numbness on the dorsum of the feet.  An MRI of the lumbar spine done showed T10 compression deformity.  There is extradural mass extending from T9-T11.  She was immediately transferred to [facility].  She underwent decompression surgery by Dr. Trenton Gammon on the same day.  This was consistent with plasmacytoma.  Multiple myeloma work-up showed 2.1 g/dL of IgA kappa monoclonal gammopathy.  She was evaluated by Dr. Julien Nordmann who referred this patient to [place].  She is currently in a rehab facility.  She also underwent a bone marrow aspiration and biopsy on 10/12/2017.  She is accompanied by her daughter and goddaughter.  Prior to all this, she was living by herself independently.  She is an ex-smoker who quit 5 to 10 years ago.  She smoked half pack per day for 30 years.  She reports weight loss of 5 to 10 pounds in the last 6 months.  Denies any fevers or night sweats.  Denies any nausea vomiting or diarrhea.  Positive for constipation.  Complains of pins-and-needles sensation on the dorsum of the feet and cold feet.  No family history of malignancies.  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   MEDICAL HISTORY:  Past Medical History:  Diagnosis Date  . Back pain   . Hypercholesteremia   . Hypertension     SURGICAL  HISTORY: Past Surgical History:  Procedure Laterality Date  . BONE MARROW BIOPSY    . LAMINECTOMY N/A 10/06/2017   Procedure: THORACIC NINE AND TEN LAMINECTOMY WITH RESECTION OF TUMOR, THORACIC EIGHT TO THORACIC ELEVEN FUSION WITH PEDICLE SCREW FIXATION;  Surgeon: Earnie Larsson, MD;  Location: [facility];  Service: Neurosurgery;  Laterality: N/A;  . TOTAL KNEE ARTHROPLASTY  2001    SOCIAL HISTORY: Social History   Socioeconomic History  . Marital status: Widowed    Spouse name: Not on file  . Number of children: 2  . Years of education: Not on file  . Highest education level: Not on file  Occupational History  . Not on file  Social Needs  . Financial resource strain: Not very hard  . Food insecurity:    Worry: Never true    Inability: Never true  . Transportation needs:    Medical: No    Non-medical: No  Tobacco Use  . Smoking status: Former Smoker    Packs/day: 0.50    Years: 30.00    Pack years: 15.00    Types: Cigarettes    Last attempt to quit: 05/31/2006    Years since quitting: 11.3  . Smokeless tobacco: Never Used  Substance and Sexual Activity  . Alcohol use: Yes    Comment: wine occassionally  . Drug use: No  . Sexual activity: Not on file  Lifestyle  . Physical activity:  Days per week: 4 days    Minutes per session: 120 min  . Stress: Not at all  Relationships  . Social connections:    Talks on phone: More than three times a week    Gets together: Once a week    Attends religious service: More than 4 times per year    Active member of club or organization: Yes    Attends meetings of clubs or organizations: More than 4 times per year    Relationship status: Widowed  . Intimate partner violence:    Fear of current or ex partner: No    Emotionally abused: No    Physically abused: No    Forced sexual activity: No  Other Topics Concern  . Not on file  Social History Narrative  . Not on file    FAMILY HISTORY: Family History  Problem Relation Age of  Onset  . Tuberculosis Mother   . Kidney failure Father   . Hypertension Paternal Aunt   . Diabetes Paternal Aunt   . Hypertension Paternal Uncle   . Diabetes Paternal Uncle   . Hypertension Daughter   . Stroke Daughter     ALLERGIES:  has No Known Allergies.  MEDICATIONS:  Current Outpatient Medications  Medication Sig Dispense Refill  . acetaminophen (TYLENOL) 650 MG CR tablet Take 1,300 mg by mouth every 8 (eight) hours as needed for pain.    [name] amLODipine (NORVASC) 5 MG tablet Take 5 mg by mouth daily.     . cyclobenzaprine (FLEXERIL) 5 MG tablet cyclobenzaprine 5 mg tablet  TAKE 1 TO 2 TABLETS BY MOUTH EVERY 6 HOURS AS NEEDED FOR MUSCLE SPASM    . diazepam (VALIUM) 5 MG tablet Take 1-2 tablets (5-10 mg total) by mouth every 6 (six) hours as needed for muscle spasms. 30 tablet 0  . hydrochlorothiazide (HYDRODIURIL) 25 MG tablet Take 25 mg by mouth daily.    . naproxen (NAPROSYN) 500 MG tablet naproxen 500 mg tablet  TAKE 1 TABLET BY MOUTH TWICE A DAY    . ondansetron (ZOFRAN) 4 MG tablet Take 1 tablet (4 mg total) by mouth every 8 (eight) hours as needed for nausea or vomiting. 10 tablet 0   No current facility-administered medications for this visit.     REVIEW OF SYSTEMS:   Constitutional: Denies fevers, chills or abnormal night sweats.  Fatigue positive. Eyes: Denies blurriness of vision, double vision or watery eyes Ears, nose, mouth, throat, and face: Denies mucositis or sore throat Respiratory: Denies cough, dyspnea or wheezes Cardiovascular: Denies palpitation, chest discomfort or lower extremity swelling Gastrointestinal:  Denies nausea, heartburn or change in bowel habits.  Positive for constipation. Skin: Denies abnormal skin rashes Lymphatics: Denies new lymphadenopathy or easy bruising Neurological: Has numbness on the dorsum of the feet. Behavioral/Psych: Mood is stable, no new changes  All other systems were reviewed with the patient and are  negative.  PHYSICAL EXAMINATION: ECOG PERFORMANCE STATUS: 2 - Symptomatic, <50% confined to bed  I have reviewed her vitals.  Blood pressure is 144/52.  Pulse is 93.  Respiratory rate 16.  Temperature is nine 9.3.  Saturations are 97%. GENERAL:alert, no distress and comfortable SKIN: skin color, texture, turgor are normal, no rashes or significant lesions EYES: normal, conjunctiva are pink and non-injected, sclera clear.  Bilateral early cataracts present. OROPHARYNX:no exudate, no erythema and lips, buccal mucosa, and tongue normal  NECK: supple, thyroid normal size, non-tender, without nodularity LYMPH:  no palpable lymphadenopathy in the cervical, axillary  or inguinal LUNGS: clear to auscultation and percussion with normal breathing effort HEART: regular rate & rhythm and no murmurs and no lower extremity edema ABDOMEN:abdomen soft, non-tender and normal bowel sounds PSYCH: alert & oriented x 3 with fluent speech   LABORATORY DATA:  I have reviewed the data as listed Recent Results (from the past 2160 hour(s))  Urinalysis, Routine w reflex microscopic     Status: Abnormal   Collection Time: 09/29/17  6:05 PM  Result Value Ref Range   Color, Urine YELLOW YELLOW   APPearance CLEAR CLEAR   Specific Gravity, Urine 1.020 1.005 - 1.030   pH 6.0 5.0 - 8.0   Glucose, UA NEGATIVE NEGATIVE mg/dL   Hgb urine dipstick NEGATIVE NEGATIVE   Bilirubin Urine NEGATIVE NEGATIVE   Ketones, ur NEGATIVE NEGATIVE mg/dL   Protein, ur NEGATIVE NEGATIVE mg/dL   Nitrite NEGATIVE NEGATIVE   Leukocytes, UA MODERATE (A) NEGATIVE   RBC / HPF 0-5 0 - 5 RBC/hpf   WBC, UA 6-10 0 - 5 WBC/hpf   Bacteria, UA RARE (A) NONE SEEN   Squamous Epithelial / LPF 0-5 0 - 5    Comment: Performed at [facility], [address], [place], [place] [address]  Urine culture     Status: Abnormal   Collection Time: 09/29/17  6:05 PM  Result Value Ref Range   Specimen Description      URINE, CLEAN CATCH Performed at [facility], [address], [place], [place] [address]    Special Requests      NONE Performed at [facility], [address], [place], [place] [address]    Culture (A)     <10,000 COLONIES/mL INSIGNIFICANT GROWTH Performed at [facility], [place] [address] [place], [place] [address]    Report Status 10/01/2017 FINAL   CBC with Differential/Platelet     Status: Abnormal   Collection Time: 10/06/17  1:52 PM  Result Value Ref Range   WBC 11.5 (H) 4.0 - 10.5 K/uL   RBC 3.61 (L) 3.87 - 5.11 MIL/uL   Hemoglobin 11.5 (L) 12.0 - 15.0 g/dL   HCT 34.8 (L) 36.0 - 46.0 %   MCV 96.4 78.0 - 100.0 fL   MCH 31.9 26.0 - 34.0 pg   MCHC 33.0 30.0 - 36.0 g/dL   RDW 13.9 11.5 - 15.5 %   Platelets 281 150 - 400 K/uL   Neutrophils Relative % 73 %   Neutro Abs 8.3 (H) 1.7 - 7.7 K/uL   Lymphocytes Relative 17 %   Lymphs Abs 2.0 0.7 - 4.0 K/uL   Monocytes Relative 10 %   Monocytes Absolute 1.2 (H) 0.1 - 1.0 K/uL   Eosinophils Relative 0 %   Eosinophils Absolute 0.0 0.0 - 0.7 K/uL   Basophils Relative 0 %   Basophils Absolute 0.0 0.0 - 0.1 K/uL    Comment: Performed at [facility], [address], [place], [place] [address]  Comprehensive metabolic panel     Status: Abnormal   Collection Time: 10/06/17  1:52 PM  Result Value Ref Range   Sodium 130 (L) 135 - 145 mmol/L   Potassium 3.8 3.5 - 5.1 mmol/L   Chloride 94 (L) 98 - 111 mmol/L    Comment: Please note change in reference range.   CO2 24 22 - 32 mmol/L   Glucose, Bld 122 (H) 70 - 99 mg/dL    Comment: Please note change in reference range.   BUN 19 8 - 23 mg/dL  Comment: Please note change in reference range.   Creatinine, Ser 0.77 0.44 - 1.00 mg/dL   Calcium 9.7 8.9 - 10.3 mg/dL   Total Protein 9.9 (H) 6.5 - 8.1 g/dL   Albumin 3.5 3.5 - 5.0 g/dL   AST 20 15 - 41 U/L   ALT 26 0 - 44 U/L    Comment: Please note change in reference range.   Alkaline Phosphatase 121 38 - 126 U/L   Total Bilirubin 0.5 0.3 - 1.2 mg/dL   GFR calc non Af  Amer >60 >60 mL/min   GFR calc Af Amer >60 >60 mL/min    Comment: (NOTE) The eGFR has been calculated using the CKD EPI equation. This calculation has not been validated in all clinical situations. eGFR's persistently <60 mL/min signify possible Chronic Kidney Disease.    Anion gap 12 5 - 15    Comment: Performed at [facility], [address], [place], [place] [address]  Urinalysis, Routine w reflex microscopic     Status: Abnormal   Collection Time: 10/06/17  2:40 PM  Result Value Ref Range   Color, Urine YELLOW YELLOW   APPearance HAZY (A) CLEAR   Specific Gravity, Urine 1.015 1.005 - 1.030   pH 7.0 5.0 - 8.0   Glucose, UA NEGATIVE NEGATIVE mg/dL   Hgb urine dipstick NEGATIVE NEGATIVE   Bilirubin Urine NEGATIVE NEGATIVE   Ketones, ur NEGATIVE NEGATIVE mg/dL   Protein, ur 30 (A) NEGATIVE mg/dL   Nitrite NEGATIVE NEGATIVE   Leukocytes, UA NEGATIVE NEGATIVE   RBC / HPF 0-5 0 - 5 RBC/hpf   WBC, UA 0-5 0 - 5 WBC/hpf   Bacteria, UA RARE (A) NONE SEEN   Budding Yeast PRESENT     Comment: Performed at [facility], [address] [place], [place] [address]  Type and screen [place]     Status: None   Collection Time: 10/06/17  6:20 PM  Result Value Ref Range   ABO/RH(D) O POS    Antibody Screen NEG    Sample Expiration      10/09/2017 Performed at [facility], [place] [address] [place], [place] [address]   ABO/Rh     Status: None   Collection Time: 10/06/17  6:20 PM  Result Value Ref Range   ABO/RH(D)      O POS Performed at [facility] [address], [place], [place] [address]   MRSA PCR Screening     Status: None   Collection Time: 10/06/17 11:08 PM  Result Value Ref Range   MRSA by PCR NEGATIVE NEGATIVE    Comment:        The GeneXpert MRSA Assay (FDA approved for NASAL specimens only), is one component of a comprehensive MRSA colonization surveillance program. It is not intended to diagnose MRSA infection nor to guide  or monitor treatment for MRSA infections. Performed at [facility], [place] [address] [place], [place] [address]   Glucose, capillary     Status: Abnormal   Collection Time: 10/06/17 11:56 PM  Result Value Ref Range   Glucose-Capillary 225 (H) 70 - 99 mg/dL  Basic Metabolic Panel     Status: Abnormal   Collection Time: 10/07/17  2:58 AM  Result Value Ref Range   Sodium 129 (L) 135 - 145 mmol/L   Potassium 3.9 3.5 - 5.1 mmol/L   Chloride 93 (L) 98 - 111 mmol/L    Comment: Please note change in reference range.  CO2 20 (L) 22 - 32 mmol/L   Glucose, Bld 207 (H) 70 - 99 mg/dL    Comment: Please note change in reference range.   BUN 14 8 - 23 mg/dL    Comment: Please note change in reference range.   Creatinine, Ser 0.88 0.44 - 1.00 mg/dL   Calcium 9.6 8.9 - 10.3 mg/dL   GFR calc non Af Amer >60 >60 mL/min   GFR calc Af Amer >60 >60 mL/min    Comment: (NOTE) The eGFR has been calculated using the CKD EPI equation. This calculation has not been validated in all clinical situations. eGFR's persistently <60 mL/min signify possible Chronic Kidney Disease.    Anion gap 16 (H) 5 - 15    Comment: Performed at [facility], [place] [address] [place], [place] [address]  CBC     Status: Abnormal   Collection Time: 10/07/17  2:58 AM  Result Value Ref Range   WBC 18.5 (H) 4.0 - 10.5 K/uL   RBC 3.12 (L) 3.87 - 5.11 MIL/uL   Hemoglobin 9.8 (L) 12.0 - 15.0 g/dL   HCT 29.9 (L) 36.0 - 46.0 %   MCV 95.8 78.0 - 100.0 fL   MCH 31.4 26.0 - 34.0 pg   MCHC 32.8 30.0 - 36.0 g/dL   RDW 13.9 11.5 - 15.5 %   Platelets 266 150 - 400 K/uL    Comment: Performed at [facility], [place] [address] [place], [place] [address]  Glucose, capillary     Status: Abnormal   Collection Time: 10/07/17  7:41 AM  Result Value Ref Range   Glucose-Capillary 145 (H) 70 - 99 mg/dL   Comment 1 Notify RN    Comment 2 Document in Chart   Glucose, capillary     Status: Abnormal   Collection Time: 10/07/17  12:11 PM  Result Value Ref Range   Glucose-Capillary 134 (H) 70 - 99 mg/dL   Comment 1 Notify RN    Comment 2 Document in Chart   Glucose, capillary     Status: Abnormal   Collection Time: 10/07/17  3:43 PM  Result Value Ref Range   Glucose-Capillary 135 (H) 70 - 99 mg/dL  Glucose, capillary     Status: Abnormal   Collection Time: 10/07/17  9:32 PM  Result Value Ref Range   Glucose-Capillary 145 (H) 70 - 99 mg/dL  Glucose, capillary     Status: Abnormal   Collection Time: 10/08/17  7:38 AM  Result Value Ref Range   Glucose-Capillary 132 (H) 70 - 99 mg/dL  Glucose, capillary     Status: Abnormal   Collection Time: 10/08/17 11:45 AM  Result Value Ref Range   Glucose-Capillary 122 (H) 70 - 99 mg/dL  Glucose, capillary     Status: Abnormal   Collection Time: 10/08/17  4:57 PM  Result Value Ref Range   Glucose-Capillary 148 (H) 70 - 99 mg/dL  Glucose, capillary     Status: Abnormal   Collection Time: 10/08/17  9:47 PM  Result Value Ref Range   Glucose-Capillary 129 (H) 70 - 99 mg/dL  Glucose, capillary     Status: Abnormal   Collection Time: 10/09/17  7:51 AM  Result Value Ref Range   Glucose-Capillary 134 (H) 70 - 99 mg/dL   Comment 1 Notify RN    Comment 2 Document in Chart   Glucose, capillary     Status: Abnormal   Collection Time: 10/09/17 12:52 PM  Result Value Ref Range  Glucose-Capillary 136 (H) 70 - 99 mg/dL  Glucose, capillary     Status: Abnormal   Collection Time: 10/09/17  5:41 PM  Result Value Ref Range   Glucose-Capillary 108 (H) 70 - 99 mg/dL  Glucose, capillary     Status: Abnormal   Collection Time: 10/09/17 10:05 PM  Result Value Ref Range   Glucose-Capillary 138 (H) 70 - 99 mg/dL  Glucose, capillary     Status: Abnormal   Collection Time: 10/10/17  8:08 AM  Result Value Ref Range   Glucose-Capillary 142 (H) 70 - 99 mg/dL  Glucose, capillary     Status: Abnormal   Collection Time: 10/10/17 11:17 AM  Result Value Ref Range   Glucose-Capillary 127  (H) 70 - 99 mg/dL   Comment 1 Notify RN    Comment 2 Document in Chart   Glucose, capillary     Status: Abnormal   Collection Time: 10/10/17  4:50 PM  Result Value Ref Range   Glucose-Capillary 115 (H) 70 - 99 mg/dL  Glucose, capillary     Status: Abnormal   Collection Time: 10/10/17  9:40 PM  Result Value Ref Range   Glucose-Capillary 136 (H) 70 - 99 mg/dL  Glucose, capillary     Status: Abnormal   Collection Time: 10/11/17  8:05 AM  Result Value Ref Range   Glucose-Capillary 147 (H) 70 - 99 mg/dL  Glucose, capillary     Status: Abnormal   Collection Time: 10/11/17 11:30 AM  Result Value Ref Range   Glucose-Capillary 126 (H) 70 - 99 mg/dL  Glucose, capillary     Status: Abnormal   Collection Time: 10/11/17  5:49 PM  Result Value Ref Range   Glucose-Capillary 135 (H) 70 - 99 mg/dL  Multiple myeloma panel, serum     Status: Abnormal   Collection Time: 10/11/17  7:49 PM  Result Value Ref Range   IgG (Immunoglobin G), Serum 599 (L) 700 - 1,600 mg/dL   IgA 2,745 (H) 64 - 422 mg/dL    Comment: (NOTE) Results confirmed on dilution.    IgM (Immunoglobulin M), Srm 18 (L) 26 - 217 mg/dL    Comment: Result confirmed on concentration.   Total Protein ELP 8.2 6.0 - 8.5 g/dL   Albumin SerPl Elph-Mcnc 3.3 2.9 - 4.4 g/dL   Alpha 1 0.3 0.0 - 0.4 g/dL   Alpha2 Glob SerPl Elph-Mcnc 1.3 (H) 0.4 - 1.0 g/dL   B-Globulin SerPl Elph-Mcnc 2.8 (H) 0.7 - 1.3 g/dL   Gamma Glob SerPl Elph-Mcnc 0.6 0.4 - 1.8 g/dL   M Protein SerPl Elph-Mcnc 2.1 (H) Not Observed g/dL   Globulin, Total 4.9 (H) 2.2 - 3.9 g/dL   Albumin/Glob SerPl 0.7 0.7 - 1.7   IFE 1 Comment     Comment: (NOTE) Immunofixation shows IgA monoclonal protein with kappa light chain specificity.    Please Note Comment     Comment: (NOTE) Protein electrophoresis scan will follow via computer, mail, or courier delivery. Performed At: [facility] [place], [place] [id number] Rush Farmer MD [id number]    Kappa/lambda light chains     Status: Abnormal   Collection Time: 10/11/17  7:49 PM  Result Value Ref Range   Kappa free light chain 12.2 3.3 - 19.4 mg/L   Lamda free light chains 7.2 5.7 - 26.3 mg/L   Kappa, lamda light chain ratio 1.69 (H) 0.26 - 1.65    Comment: (NOTE) Performed At: [facility] [address] [place], [place] [id number]  Rush Farmer MD [id number]   Beta 2 microglobulin, serum     Status: None   Collection Time: 10/11/17  7:49 PM  Result Value Ref Range   Beta-2 Microglobulin 1.4 0.6 - 2.4 mg/L    Comment: (NOTE) Siemens Immulite 2000 Immunochemiluminometric assay (ICMA) Values obtained with different assay methods or kits cannot be used interchangeably. Results cannot be interpreted as absolute evidence of the presence or absence of malignant disease. Performed At: [facility] [place], [place] [id number] Rush Farmer MD [id number]   IgG, IgA, IgM     Status: Abnormal   Collection Time: 10/11/17  7:49 PM  Result Value Ref Range   IgG (Immunoglobin G), Serum 612 (L) 700 - 1,600 mg/dL   IgA 2,833 (H) 64 - 422 mg/dL    Comment: (NOTE) Results confirmed on dilution.    IgM (Immunoglobulin M), Srm 19 (L) 26 - 217 mg/dL    Comment: (NOTE) Result confirmed on concentration. Performed At: [facility] [place], [place] [id number] Rush Farmer MD [id number]   Glucose, capillary     Status: Abnormal   Collection Time: 10/11/17 10:26 PM  Result Value Ref Range   Glucose-Capillary 162 (H) 70 - 99 mg/dL  CBC with Differential/Platelet     Status: Abnormal   Collection Time: 10/12/17  7:32 AM  Result Value Ref Range   WBC 15.0 (H) 4.0 - 10.5 K/uL   RBC 3.28 (L) 3.87 - 5.11 MIL/uL   Hemoglobin 10.3 (L) 12.0 - 15.0 g/dL   HCT 31.2 (L) 36.0 - 46.0 %   MCV 95.1 78.0 - 100.0 fL   MCH 31.4 26.0 - 34.0 pg   MCHC 33.0 30.0 - 36.0 g/dL   RDW 13.7 11.5 - 15.5 %   Platelets 260 150 - 400 K/uL    Neutrophils Relative % 78 %   Neutro Abs 11.8 (H) 1.7 - 7.7 K/uL   Lymphocytes Relative 13 %   Lymphs Abs 1.9 0.7 - 4.0 K/uL   Monocytes Relative 8 %   Monocytes Absolute 1.2 (H) 0.1 - 1.0 K/uL   Eosinophils Relative 0 %   Eosinophils Absolute 0.0 0.0 - 0.7 K/uL   Basophils Relative 0 %   Basophils Absolute 0.0 0.0 - 0.1 K/uL   Immature Granulocytes 1 %   Abs Immature Granulocytes 0.1 0.0 - 0.1 K/uL    Comment: Performed at [facility], [place] [address] [place], [place] [address]  Protime-INR     Status: None   Collection Time: 10/12/17  7:32 AM  Result Value Ref Range   Prothrombin Time 14.5 11.4 - 15.2 seconds   INR 1.14     Comment: Performed at [place] [place] [address], [place], [place] [address]  Glucose, capillary     Status: Abnormal   Collection Time: 10/12/17  9:11 AM  Result Value Ref Range   Glucose-Capillary 154 (H) 70 - 99 mg/dL  Glucose, capillary     Status: Abnormal   Collection Time: 10/12/17 12:14 PM  Result Value Ref Range   Glucose-Capillary 127 (H) 70 - 99 mg/dL  Lactate dehydrogenase     Status: Abnormal   Collection Time: 10/19/17  2:25 PM  Result Value Ref Range   LDH 205 (H) 98 - 192 U/L    Comment: Performed at [facility], [address], [place], [place] [address]  Uric acid     Status: None   Collection Time: 10/19/17  2:25 PM  Result Value Ref Range   Uric Acid, Serum 6.0 2.5 - 7.1 mg/dL    Comment: Performed at [facility] [address], [place], [place] [address]  CBC with Differential     Status: Abnormal   Collection Time: 10/19/17  2:25 PM  Result Value Ref Range   WBC 15.3 (H) 4.0 - 10.5 K/uL   RBC 3.00 (L) 3.87 - 5.11 MIL/uL   Hemoglobin 9.6 (L) 12.0 - 15.0 g/dL   HCT 28.8 (L) 36.0 - 46.0 %   MCV 96.0 78.0 - 100.0 fL   MCH 32.0 26.0 - 34.0 pg   MCHC 33.3 30.0 - 36.0 g/dL   RDW 14.8 11.5 - 15.5 %   Platelets 236 150 - 400 K/uL   Neutrophils Relative % 78 %   Neutro Abs 12.0 (H) 1.7 - 7.7 K/uL   Lymphocytes  Relative 11 %   Lymphs Abs 1.7 0.7 - 4.0 K/uL   Monocytes Relative 10 %   Monocytes Absolute 1.5 (H) 0.1 - 1.0 K/uL   Eosinophils Relative 1 %   Eosinophils Absolute 0.1 0.0 - 0.7 K/uL   Basophils Relative 0 %   Basophils Absolute 0.0 0.0 - 0.1 K/uL    Comment: Performed at [facility], [address], [place], [place] [address]    RADIOGRAPHIC STUDIES: I have personally reviewed the radiological images as listed and agreed with the findings in the report. Dg Thoracic Spine 2 View  Result Date: 10/06/2017 CLINICAL DATA:  T9 through T11 fusion with pedicle screws and T10 laminectomy. EXAM: THORACIC SPINE 2 VIEWS; DG C-ARM 61-120 MIN COMPARISON:  10/10/2016 CXR FINDINGS: 59 seconds of fluoroscopic time was utilized during T9 through T11 fusion with pedicle screws and T10 laminectomy procedure. Fine bony detail is limited due to the fluoroscopic technique. Three pairs of pedicle screws are noted along the lower thoracic spine skipping 1 level. No hardware failure is identified. An unnamed catheter/probe is also noted with tip projecting over the left upper quadrant of the abdomen. IMPRESSION: Fluoroscopic time utilized for T10 laminectomy with T9 through T11 fusion with pedicle screws. No immediate intraoperative complications are identified. Electronically Signed   By: Ashley Royalty M.D.   On: 10/06/2017 21:19   Dg Lumbar Spine Complete  Result Date: 09/29/2017 CLINICAL DATA:  Low back pain for 1 week, severe pain since 0300 hours this morning, could not get out of bed, former smoker, hypertension EXAM: LUMBAR SPINE - COMPLETE 4+ VIEW COMPARISON:  None FINDINGS: Osseous demineralization. Five non-rib-bearing lumbar vertebra. Facet degenerative changes lower lumbar spine. Disc space narrowing at L4-L5 with minimal anterolisthesis. Mild disc space narrowing at L2-L3. Vertebral body heights maintained without fracture or additional subluxation. No spondylolysis. SI joints preserved. Narrowing of the hip  joints bilaterally. Atherosclerotic calcifications aorta. IMPRESSION: Osseous demineralization with degenerative disc and facet disease changes of the lumbar spine. Minimal anterolisthesis at L4-L5. No acute abnormalities. Electronically Signed   By: Lavonia Dana M.D.   On: 09/29/2017 16:45   Mr Lumbar Spine Wo Contrast  Result Date: 10/06/2017 CLINICAL DATA:  Rapidly progressive neurological deficit in the lower extremities. EXAM: MRI LUMBAR SPINE WITHOUT CONTRAST TECHNIQUE: Multiplanar, multisequence MR imaging of the lumbar spine was performed. No intravenous contrast was administered. COMPARISON:  None. FINDINGS: Segmentation:  Standard. Alignment:  Physiologic. Vertebrae: No discitis or osteomyelitis. Diffuse abnormal T1 hypointense and T2 hyperintense lesions throughout the thoracolumbar spine and pelvis most consistent with malignancy, multiple myeloma versus metastatic disease. Abnormal bone lesion most prominent in  the posterior aspect of the T10 vertebral body which extends into the posterior elements with a 9 x 15 x 44 mm posterior epidural soft tissue mass. Pathologic compression fracture of the T10 vertebral body with 6 mm of retropulsion and approximately 50% height loss. Severe multifactorial spinal stenosis and cord compression of the lower thoracic spinal cord resulting from the T10 compression fracture and retropulsion in combination with the posterior epidural tumor. Conus medullaris and cauda equina: Conus extends to the T12 level. Conus and cauda equina appear normal. Paraspinal and other soft tissues: No acute paraspinal abnormality. Partially visualized right renal cystic mass. Disc levels: Disc spaces: Degenerative disc disease with disc desiccation at L4-5. T12-L1: No significant disc bulge. No evidence of neural foraminal stenosis. No central canal stenosis. L1-L2: No significant disc bulge. No evidence of neural foraminal stenosis. No central canal stenosis. L2-L3: Mild broad-based disc  bulge. Mild bilateral facet arthropathy. No evidence of neural foraminal stenosis. No central canal stenosis. L3-L4: Mild broad-based disc bulge. Moderate bilateral facet arthropathy. No evidence of neural foraminal stenosis. No central canal stenosis. L4-L5: Broad-based disc bulge. Moderate bilateral facet arthropathy. No evidence of neural foraminal stenosis. No central canal stenosis. L5-S1: No significant disc bulge. No evidence of neural foraminal stenosis. No central canal stenosis. Moderate bilateral facet arthropathy. IMPRESSION: 1. Abnormal bone lesion most prominent in the posterior aspect of the T10 vertebral body which extends into the posterior elements with a 9 x 15 x 44 mm posterior epidural soft tissue mass. Pathologic compression fracture of the T10 vertebral body with 6 mm of retropulsion. Severe multifactorial spinal stenosis and cord compression of the lower thoracic spinal cord resulting from the T10 compression fracture and retropulsion in combination with the posterior epidural tumor. 2. Abnormal bone lesions throughout the thoracolumbar spine and pelvis most consistent with malignancy, metastatic disease versus multiple myeloma. Critical Value/emergent results were called by telephone at the time of interpretation on 10/06/2017 at 3:30 pm to Dr. Julianne Rice , who verbally acknowledged these results. Electronically Signed   By: Kathreen Devoid   On: 10/06/2017 15:32   Dg Bone Survey Met  Result Date: 10/12/2017 CLINICAL DATA:  Back pain.  Multiple myeloma. EXAM: METASTATIC BONE SURVEY COMPARISON:  Lumbar spine radiographs, 09/29/2017. FINDINGS: There are several lucent/lytic type lesions noted in the skull on the lateral view, largest projecting near the coronal fissure, consistent with multiple myeloma. These are not as well-defined on the frontal skull view. There are defined lytic lesions in the left femur extending from the proximal through the distal shaft. Four discrete lesions are  noted. Are no other discrete osteolytic lesions.  No osteoblastic lesions. There has been a previous posterior thoracic spine fusion from T8 through T11, spanning a chronically fractured T10. Orthopedic hardware appears well seated well aligned. No evidence of an acute fracture. Orthopedic hardware is new since the prior lumbar spine radiographs. There are advanced arthropathic changes of the hips with loss of the joint space, subchondral sclerosis and cystic change along the superior acetabula osteophytes from the bases of the femoral heads. Bones are demineralized. IMPRESSION: 1. There are findings consistent with multiple myeloma with lytic lesions noted in the skull and left femur. 2. No other lytic lesions. 3. Chronic compression fracture of T10. Electronically Signed   By: Lajean Manes M.D.   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 Evaluate for multiple myeloma. EXAM: CT GUIDED BONE MARROW ASPIRATES  AND BIOPSY Physician: Stephan Minister. Anselm Pancoast, MD MEDICATIONS: None. ANESTHESIA/SEDATION: Fentanyl 25 mcg IV; Versed 1 mg IV Moderate Sedation Time:  14 minutes The patient was continuously monitored during the procedure by the interventional radiology nurse under my direct supervision. COMPLICATIONS: None immediate. PROCEDURE: The procedure was explained to the patient. The risks and benefits of the procedure were discussed and the patient's questions were addressed. Informed consent was obtained from the patient. The patient was placed prone on CT table. Images of the pelvis were obtained. The right side of back was prepped and draped in sterile fashion. The skin and right posterior ilium were anesthetized with 1% lidocaine. 11 gauge bone needle was directed into the right ilium with CT guidance. Two aspirates and two core biopsies were obtained. Bandage placed over the puncture site. FINDINGS: The pelvic bones are mildly heterogeneous.  Needle position was confirmed within the posterior right ilium. Adequate core biopsies and aspirations were obtained. IMPRESSION: CT guided bone marrow aspiration and core biopsy. Electronically Signed   By: Markus Daft M.D.   On: 10/12/2017 09:49   Dg C-arm 1-60 Min  Result Date: 10/06/2017 CLINICAL DATA:  T9 through T11 fusion with pedicle screws and T10 laminectomy. EXAM: THORACIC SPINE 2 VIEWS; DG C-ARM 61-120 MIN COMPARISON:  10/10/2016 CXR FINDINGS: 59 seconds of fluoroscopic time was utilized during T9 through T11 fusion with pedicle screws and T10 laminectomy procedure. Fine bony detail is limited due to the fluoroscopic technique. Three pairs of pedicle screws are noted along the lower thoracic spine skipping 1 level. No hardware failure is identified. An unnamed catheter/probe is also noted with tip projecting over the left upper quadrant of the abdomen. IMPRESSION: Fluoroscopic time utilized for T10 laminectomy with T9 through T11 fusion with pedicle screws. No immediate intraoperative complications are identified. Electronically Signed   By: Ashley Royalty M.D.   On: 10/06/2017 21:19   Dg C-arm 1-60 Min  Result Date: 10/06/2017 CLINICAL DATA:  T9 through T11 fusion with pedicle screws and T10 laminectomy. EXAM: THORACIC SPINE 2 VIEWS; DG C-ARM 61-120 MIN COMPARISON:  10/10/2016 CXR FINDINGS: 59 seconds of fluoroscopic time was utilized during T9 through T11 fusion with pedicle screws and T10 laminectomy procedure. Fine bony detail is limited due to the fluoroscopic technique. Three pairs of pedicle screws are noted along the lower thoracic spine skipping 1 level. No hardware failure is identified. An unnamed catheter/probe is also noted with tip projecting over the left upper quadrant of the abdomen. IMPRESSION: Fluoroscopic time utilized for T10 laminectomy with T9 through T11 fusion with pedicle screws. No immediate intraoperative complications are identified. Electronically Signed   By: Ashley Royalty M.D.   On: 10/06/2017 21:19    ASSESSMENT & PLAN:  Multiple myeloma ([facility]) 1.  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 - Again presented to the ER on 10/06/2017 with difficulty walking and tingling on the dorsum of both feet.  MRI of the lumbar spine showed multiple lesions in the thoracic and lumbar vertebrae.  Pathological compression fracture of the T10 vertebral body was seen.  There is an epidural soft tissue mass measuring 9 x 15 x 44 mm at T10 vertebral body level. -T9-T10 laminectomy with resection of epidural tumor T10 bilateral transpedicular decompression/partial corpectomy on 10/06/2017, pathology consistent with plasma cell  neoplasm. -Skeletal survey on 10/12/2017 shows lytic lesions in the skull and left femur. - SPEP shows 2.1 g of IgA kappa monoclonal protein.  Free light chain ratio was 1.69.  Normal renal function with a normal calcium level.  Beta-2 microglobulin was 1.4. -She underwent a bone marrow aspiration biopsy on 10/12/2017.  This showed 9% plasma cells in the aspirate and 20% CD38 positive cells on core biopsy.  We have contacted pathology department to make sure cytogenetics and FISH panel was sent. - She has an appointment to see radiation oncology on 10/23/2017.  Tentative start date for radiation was 10/30/2017. -I have recommended checking an LDH level.  I will repeat a CBC with differential as her white count was elevated, likely from steroids during her hospital admission.  I have also recommended 24-hour urine for UPEP and urine immunofixation. -Since she is still has some pins-and-needles/tingling sensation in the dorsum of the feet and they feel cold.  She is wearing a brace when she is not lying in the bed.  She also has an appointment to see Dr. Trenton Gammon  next week. -I will see her back in 2 weeks to discuss the results and further treatment plan.   Total time spent is 60 minutes with more than 50% of the time spent face-to-face discussing diagnosis, prognosis, treatment options.    All questions were answered. The patient knows to call the clinic with any problems, questions or concerns.     Derek Jack, MD 10/19/17 4:44 PM

## 2017-10-19 NOTE — Assessment & Plan Note (Signed)
1.  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 - Again presented to the ER on 10/06/2017 with difficulty walking and tingling on the dorsum of both feet.  MRI of the lumbar spine showed multiple lesions in the thoracic and lumbar vertebrae.  Pathological compression fracture of the T10 vertebral body was seen.  There is an epidural soft tissue mass measuring 9 x 15 x 44 mm at T10 vertebral body level. -T9-T10 laminectomy with resection of epidural tumor T10 bilateral transpedicular decompression/partial corpectomy on 10/06/2017, pathology consistent with plasma cell neoplasm. -Skeletal survey on 10/12/2017 shows lytic lesions in the skull and left femur. - SPEP shows 2.1 g of IgA kappa monoclonal protein.  Free light chain ratio was 1.69.  Normal renal function with a normal calcium level.  Beta-2 microglobulin was 1.4. -She underwent a bone marrow aspiration biopsy on 10/12/2017.  This showed 9% plasma cells in the aspirate and 20% CD38 positive cells on core biopsy.  We have contacted pathology department to make sure cytogenetics and FISH panel was sent. - She has an appointment to see radiation oncology on 10/23/2017.  Tentative start date for radiation was 10/30/2017. -I have recommended checking an LDH level.  I will repeat a CBC with differential as her white count was elevated, likely from steroids during her hospital admission.  I have also recommended 24-hour urine for UPEP and urine immunofixation. -Since she is still has some pins-and-needles/tingling sensation in the dorsum of the feet and they feel cold.  She is wearing a brace when she is not lying in the bed.  She also has an appointment to see Dr. Trenton Gammon next week. -I will see her back in 2 weeks to discuss the results and further  treatment plan.

## 2017-10-20 ENCOUNTER — Encounter (HOSPITAL_COMMUNITY): Payer: Self-pay | Admitting: Internal Medicine

## 2017-10-22 NOTE — Progress Notes (Signed)
  Radiation Oncology         [phone] ________________________________  Name: VORA CLOVER MRN: [id number]  Date: 10/23/2017  DOB: Aug 03, 1940  SIMULATION AND TREATMENT PLANNING NOTE    ICD-10-CM   1. Multiple myeloma not having achieved remission ([facility]) C90.00     DIAGNOSIS:  77 yo woman with multiple myeloma s/p dorsal laminectomy and decompression for spinal cord compression at T9-T10  Pre-Op: Post-Op:      NARRATIVE:  The patient was brought to the [facility].  Identity was confirmed.  All relevant records and images related to the planned course of therapy were reviewed.  The patient freely provided informed written consent to proceed with treatment after reviewing the details related to the planned course of therapy. The consent form was witnessed and verified by the simulation staff.  Then, the patient was set-up in a stable reproducible  supine position for radiation therapy.  CT images were obtained.  Surface markings were placed.  The CT images were loaded into the planning software.  Then the target and avoidance structures were contoured.  Treatment planning then occurred.  The radiation prescription was entered and confirmed.  Then, I designed and supervised the construction of a total of at least 2 medically necessary complex treatment devices.  I have requested : 3D Simulation  I have requested a DVH of the following structures: heart, esophagus, both lungs, and target.  I have ordered:Nutrition Consult  PLAN:  The patient will receive 20 Gy in 10 fractions.  ________________________________  Sheral Apley Tammi Klippel, M.D.

## 2017-10-23 ENCOUNTER — Ambulatory Visit
Admission: RE | Admit: 2017-10-23 | Discharge: 2017-10-23 | Disposition: A | Discharge status: Home / Self Care | Payer: Medicare HMO | Source: Ambulatory Visit | Attending: Radiation Oncology | Admitting: Radiation Oncology

## 2017-10-23 DIAGNOSIS — Z510 Encounter for antineoplastic radiation therapy: Secondary | ICD-10-CM | POA: Diagnosis not present

## 2017-10-23 DIAGNOSIS — C7951 Secondary malignant neoplasm of bone: Secondary | ICD-10-CM | POA: Diagnosis not present

## 2017-10-23 DIAGNOSIS — C9000 Multiple myeloma not having achieved remission: Secondary | ICD-10-CM | POA: Diagnosis not present

## 2017-10-24 DIAGNOSIS — Z87891 Personal history of nicotine dependence: Secondary | ICD-10-CM | POA: Diagnosis not present

## 2017-10-24 DIAGNOSIS — E7800 Pure hypercholesterolemia, unspecified: Secondary | ICD-10-CM | POA: Diagnosis not present

## 2017-10-24 DIAGNOSIS — C9000 Multiple myeloma not having achieved remission: Secondary | ICD-10-CM | POA: Diagnosis not present

## 2017-10-24 DIAGNOSIS — C720 Malignant neoplasm of spinal cord: Secondary | ICD-10-CM | POA: Diagnosis not present

## 2017-10-24 DIAGNOSIS — M8458XD Pathological fracture in neoplastic disease, other specified site, subsequent encounter for fracture with routine healing: Secondary | ICD-10-CM | POA: Diagnosis not present

## 2017-10-24 DIAGNOSIS — M519 Unspecified thoracic, thoracolumbar and lumbosacral intervertebral disc disorder: Secondary | ICD-10-CM | POA: Diagnosis not present

## 2017-10-24 DIAGNOSIS — M4804 Spinal stenosis, thoracic region: Secondary | ICD-10-CM | POA: Diagnosis not present

## 2017-10-24 DIAGNOSIS — I10 Essential (primary) hypertension: Secondary | ICD-10-CM | POA: Diagnosis not present

## 2017-10-24 DIAGNOSIS — K5900 Constipation, unspecified: Secondary | ICD-10-CM | POA: Diagnosis not present

## 2017-10-24 DIAGNOSIS — Z981 Arthrodesis status: Secondary | ICD-10-CM | POA: Diagnosis not present

## 2017-10-25 ENCOUNTER — Other Ambulatory Visit (HOSPITAL_COMMUNITY)
Admission: AD | Admit: 2017-10-25 | Discharge: 2017-10-25 | Disposition: A | Discharge status: Home / Self Care | Payer: Medicare HMO | Source: Skilled Nursing Facility | Attending: Hematology | Admitting: Hematology

## 2017-10-25 DIAGNOSIS — C9000 Multiple myeloma not having achieved remission: Secondary | ICD-10-CM | POA: Insufficient documentation

## 2017-10-25 DIAGNOSIS — M519 Unspecified thoracic, thoracolumbar and lumbosacral intervertebral disc disorder: Secondary | ICD-10-CM | POA: Diagnosis not present

## 2017-10-25 DIAGNOSIS — M8458XD Pathological fracture in neoplastic disease, other specified site, subsequent encounter for fracture with routine healing: Secondary | ICD-10-CM | POA: Diagnosis not present

## 2017-10-25 DIAGNOSIS — C720 Malignant neoplasm of spinal cord: Secondary | ICD-10-CM | POA: Diagnosis not present

## 2017-10-25 DIAGNOSIS — M4804 Spinal stenosis, thoracic region: Secondary | ICD-10-CM | POA: Diagnosis not present

## 2017-10-25 DIAGNOSIS — I10 Essential (primary) hypertension: Secondary | ICD-10-CM | POA: Diagnosis not present

## 2017-10-25 DIAGNOSIS — Z510 Encounter for antineoplastic radiation therapy: Secondary | ICD-10-CM | POA: Diagnosis not present

## 2017-10-25 DIAGNOSIS — C7951 Secondary malignant neoplasm of bone: Secondary | ICD-10-CM | POA: Diagnosis not present

## 2017-10-26 LAB — UPEP/UIFE/LIGHT CHAINS/TP, 24-HR UR
% BETA, URINE: 28.8 %
ALPHA 1 URINE: 5.4 %
ALPHA 2 UR: 26.3 %
Albumin, U: 11.3 %
Free Kappa Lt Chains,Ur: 182 mg/L — ABNORMAL HIGH (ref 1.35–24.19)
Free Kappa/Lambda Ratio: 16.7 — ABNORMAL HIGH (ref 2.04–10.37)
Free Lambda Lt Chains,Ur: 10.9 mg/L — ABNORMAL HIGH (ref 0.24–6.66)
GAMMA GLOBULIN URINE: 28.1 %
M-SPIKE %, Urine: 11.3 % — ABNORMAL HIGH
M-Spike, Mg/24 Hr: 16 mg/24 hr — ABNORMAL HIGH
TOTAL PROTEIN, URINE-UPE24: 15.7 mg/dL
Total Protein, Urine-Ur/day: 145 mg/24 hr (ref 30–150)
Total Volume: 925

## 2017-10-30 ENCOUNTER — Ambulatory Visit: Payer: Medicare HMO

## 2017-10-30 DIAGNOSIS — C9000 Multiple myeloma not having achieved remission: Secondary | ICD-10-CM | POA: Diagnosis not present

## 2017-10-30 DIAGNOSIS — Z510 Encounter for antineoplastic radiation therapy: Secondary | ICD-10-CM | POA: Diagnosis not present

## 2017-10-30 DIAGNOSIS — C7951 Secondary malignant neoplasm of bone: Secondary | ICD-10-CM | POA: Diagnosis not present

## 2017-10-31 ENCOUNTER — Encounter (HOSPITAL_COMMUNITY): Payer: Self-pay | Admitting: General Practice

## 2017-10-31 ENCOUNTER — Ambulatory Visit
Admission: RE | Admit: 2017-10-31 | Discharge: 2017-10-31 | Disposition: A | Discharge status: Home / Self Care | Payer: Medicare HMO | Source: Ambulatory Visit | Attending: Radiation Oncology | Admitting: Radiation Oncology

## 2017-10-31 DIAGNOSIS — M8458XD Pathological fracture in neoplastic disease, other specified site, subsequent encounter for fracture with routine healing: Secondary | ICD-10-CM | POA: Diagnosis not present

## 2017-10-31 DIAGNOSIS — Z510 Encounter for antineoplastic radiation therapy: Secondary | ICD-10-CM | POA: Diagnosis not present

## 2017-10-31 DIAGNOSIS — C9000 Multiple myeloma not having achieved remission: Secondary | ICD-10-CM | POA: Diagnosis not present

## 2017-10-31 DIAGNOSIS — I10 Essential (primary) hypertension: Secondary | ICD-10-CM | POA: Diagnosis not present

## 2017-10-31 DIAGNOSIS — C720 Malignant neoplasm of spinal cord: Secondary | ICD-10-CM | POA: Diagnosis not present

## 2017-10-31 DIAGNOSIS — M4804 Spinal stenosis, thoracic region: Secondary | ICD-10-CM | POA: Diagnosis not present

## 2017-10-31 DIAGNOSIS — C7951 Secondary malignant neoplasm of bone: Secondary | ICD-10-CM | POA: Diagnosis not present

## 2017-10-31 DIAGNOSIS — M519 Unspecified thoracic, thoracolumbar and lumbosacral intervertebral disc disorder: Secondary | ICD-10-CM | POA: Diagnosis not present

## 2017-10-31 NOTE — Progress Notes (Signed)
[facility] Psychosocial Distress Screening Clinical Social Work  Clinical Social Work was referred by distress screening protocol.  The patient scored a 6 on the Psychosocial Distress Thermometer which indicates moderate distress. Clinical Social Worker contacted patient by phone to assess for distress and other psychosocial needs. Spoke w patient who stated that she is doing well, engaged w treatment and physical therapy.  Has significant support from family for transportation to appointments ("they already have a schedule made out") and support at home if needed.  CSW will mail information packet on [facility] resources as well as CSW contact information so patient can contact as needed.    ONCBCN DISTRESS SCREENING 10/19/2017  Screening Type Initial Screening  Distress experienced in past week (1-10) 6  Physical Problem type Pain;Getting around;Bathing/dressing;Breathing;Loss of appetitie;Constipation/diarrhea;Tingling hands/feet    Clinical Social Worker follow up needed: No.  If yes, follow up plan:  Beverely Pace, [place], LCSW Clinical Social Worker Phone:  [phone]

## 2017-11-01 ENCOUNTER — Ambulatory Visit
Admission: RE | Admit: 2017-11-01 | Discharge: 2017-11-01 | Disposition: A | Discharge status: Home / Self Care | Payer: Medicare HMO | Source: Ambulatory Visit | Attending: Radiation Oncology | Admitting: Radiation Oncology

## 2017-11-01 DIAGNOSIS — C9000 Multiple myeloma not having achieved remission: Secondary | ICD-10-CM | POA: Diagnosis not present

## 2017-11-01 DIAGNOSIS — M8458XD Pathological fracture in neoplastic disease, other specified site, subsequent encounter for fracture with routine healing: Secondary | ICD-10-CM | POA: Diagnosis not present

## 2017-11-01 DIAGNOSIS — C7951 Secondary malignant neoplasm of bone: Secondary | ICD-10-CM | POA: Diagnosis not present

## 2017-11-01 DIAGNOSIS — Z510 Encounter for antineoplastic radiation therapy: Secondary | ICD-10-CM | POA: Diagnosis not present

## 2017-11-01 DIAGNOSIS — C720 Malignant neoplasm of spinal cord: Secondary | ICD-10-CM | POA: Diagnosis not present

## 2017-11-01 DIAGNOSIS — M519 Unspecified thoracic, thoracolumbar and lumbosacral intervertebral disc disorder: Secondary | ICD-10-CM | POA: Diagnosis not present

## 2017-11-01 DIAGNOSIS — I10 Essential (primary) hypertension: Secondary | ICD-10-CM | POA: Diagnosis not present

## 2017-11-01 DIAGNOSIS — M4804 Spinal stenosis, thoracic region: Secondary | ICD-10-CM | POA: Diagnosis not present

## 2017-11-02 ENCOUNTER — Telehealth (HOSPITAL_COMMUNITY): Payer: Self-pay | Admitting: Hematology

## 2017-11-02 ENCOUNTER — Inpatient Hospital Stay (HOSPITAL_COMMUNITY): Payer: Medicare HMO | Admitting: Hematology

## 2017-11-02 ENCOUNTER — Other Ambulatory Visit: Payer: Self-pay

## 2017-11-02 ENCOUNTER — Encounter (HOSPITAL_COMMUNITY): Payer: Self-pay | Admitting: Hematology

## 2017-11-02 ENCOUNTER — Ambulatory Visit
Admission: RE | Admit: 2017-11-02 | Discharge: 2017-11-02 | Disposition: A | Discharge status: Home / Self Care | Payer: Medicare HMO | Source: Ambulatory Visit | Attending: Radiation Oncology | Admitting: Radiation Oncology

## 2017-11-02 VITALS — BP 119/49 | HR 98 | Temp 98.7°F | Resp 16

## 2017-11-02 DIAGNOSIS — M4804 Spinal stenosis, thoracic region: Secondary | ICD-10-CM | POA: Diagnosis not present

## 2017-11-02 DIAGNOSIS — M519 Unspecified thoracic, thoracolumbar and lumbosacral intervertebral disc disorder: Secondary | ICD-10-CM | POA: Diagnosis not present

## 2017-11-02 DIAGNOSIS — C7951 Secondary malignant neoplasm of bone: Secondary | ICD-10-CM | POA: Diagnosis not present

## 2017-11-02 DIAGNOSIS — C9000 Multiple myeloma not having achieved remission: Secondary | ICD-10-CM | POA: Diagnosis not present

## 2017-11-02 DIAGNOSIS — M8458XD Pathological fracture in neoplastic disease, other specified site, subsequent encounter for fracture with routine healing: Secondary | ICD-10-CM | POA: Diagnosis not present

## 2017-11-02 DIAGNOSIS — Z510 Encounter for antineoplastic radiation therapy: Secondary | ICD-10-CM | POA: Diagnosis not present

## 2017-11-02 DIAGNOSIS — I10 Essential (primary) hypertension: Secondary | ICD-10-CM | POA: Diagnosis not present

## 2017-11-02 DIAGNOSIS — C720 Malignant neoplasm of spinal cord: Secondary | ICD-10-CM | POA: Diagnosis not present

## 2017-11-02 MED ORDER — PROCHLORPERAZINE MALEATE 10 MG PO TABS: 10.0000 mg | ORAL_TABLET | Freq: Four times a day (QID) | ORAL | 1 refills | Status: DC | PRN

## 2017-11-02 MED ORDER — LENALIDOMIDE 25 MG PO CAPS: ORAL_CAPSULE | ORAL | 3 refills | Status: DC

## 2017-11-02 MED ORDER — DEXAMETHASONE 4 MG PO TABS: ORAL_TABLET | ORAL | 3 refills | Status: DC

## 2017-11-02 MED ORDER — ACYCLOVIR 400 MG PO TABS: 400.0000 mg | ORAL_TABLET | Freq: Two times a day (BID) | ORAL | 3 refills | Status: DC

## 2017-11-02 NOTE — Assessment & Plan Note (Signed)
1.  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 - Again presented to the ER on 10/06/2017 with difficulty walking and tingling on the dorsum of both feet.  MRI of the lumbar spine showed multiple lesions in the thoracic and lumbar vertebrae.  Pathological compression fracture of the T10 vertebral body was seen.  There is an epidural soft tissue mass measuring 9 x 15 x 44 mm at T10 vertebral body level. -T9-T10 laminectomy with resection of epidural tumor T10 bilateral transpedicular decompression/partial corpectomy on 10/06/2017, pathology consistent with plasma cell neoplasm. -Skeletal survey on 10/12/2017 shows lytic lesions in the skull and left femur. - SPEP shows 2.1 g of IgA kappa monoclonal protein.  Free light chain ratio was 1.69.  Normal renal function with a normal calcium level.  Beta-2 microglobulin was 1.4. -She underwent a bone marrow aspiration biopsy on 10/12/2017.  This showed 9% plasma cells in the aspirate and 20% CD38 positive cells on core biopsy.  We have contacted pathology department to make sure cytogenetics and FISH panel was sent. - LDH is elevated at 205.  I have reviewed results of 24-hour urine

## 2017-11-02 NOTE — Progress Notes (Unsigned)
[name],  [id number]   CLINIC:  Medical Oncology/Hematology  PCP:  Asencion Noble, MD [address] [place] [address] [phone]   REASON FOR VISIT:  Follow-up for multiple myeloma  CURRENT THERAPY: radiation completed next Friday then start chemo oral revlimid, velcade injection, and dexamethasone.     INTERVAL HISTORY:  [name] 77 y.o. female returns for routine follow-up for multiple myeloma. Patient is here today with her daughter and granddaughter. She is getting physical therapy and is tolerating well. She is doing well with radiation and should be completed by next Friday. She will able to move around more. Her appetite is 75% and she is drinking ensure daily to help maintain her weight. Her energy level is 75%. Patient denies any new pain. Denies any nausea, vomiting, or dairrhea. Denies any fevers, chills, or night sweats.    REVIEW OF SYSTEMS:  Review of Systems  Constitutional: Positive for fatigue.  Gastrointestinal: Positive for nausea.  Neurological: Positive for extremity weakness.  All other systems reviewed and are negative.    PAST MEDICAL/SURGICAL HISTORY:  Past Medical History:  Diagnosis Date  . Back pain   . Hypercholesteremia   . Hypertension    Past Surgical History:  Procedure Laterality Date  . BONE MARROW BIOPSY    . LAMINECTOMY N/A 10/06/2017   Procedure: THORACIC NINE AND TEN LAMINECTOMY WITH RESECTION OF TUMOR, THORACIC EIGHT TO THORACIC ELEVEN FUSION WITH PEDICLE SCREW FIXATION;  Surgeon: Earnie Larsson, MD;  Location: [facility];  Service: Neurosurgery;  Laterality: N/A;  . TOTAL KNEE ARTHROPLASTY  2001     SOCIAL HISTORY:  Social History   Socioeconomic History  . Marital status: Widowed    Spouse name: Not on file  . Number of children: 2  . Years of education: Not on file  . Highest education level: Not on file  Occupational History  . Not on file  Social Needs  .  Financial resource strain: Not very hard  . Food insecurity:    Worry: Never true    Inability: Never true  . Transportation needs:    Medical: No    Non-medical: No  Tobacco Use  . Smoking status: Former Smoker    Packs/day: 0.50    Years: 30.00    Pack years: 15.00    Types: Cigarettes    Last attempt to quit: 05/31/2006    Years since quitting: 11.4  . Smokeless tobacco: Never Used  Substance and Sexual Activity  . Alcohol use: Yes    Comment: wine occassionally  . Drug use: No  . Sexual activity: Not on file  Lifestyle  . Physical activity:    Days per week: 4 days    Minutes per session: 120 min  . Stress: Not at all  Relationships  . Social connections:    Talks on phone: More than three times a week    Gets together: Once a week    Attends religious service: More than 4 times per year    Active member of club or organization: Yes    Attends meetings of clubs or organizations: More than 4 times per year    Relationship status: Widowed  . Intimate partner violence:    Fear of current or ex partner: No    Emotionally abused: No    Physically abused: No    Forced sexual activity: No  Other Topics Concern  . Not on file  Social History Narrative  .  Not on file    FAMILY HISTORY:  Family History  Problem Relation Age of Onset  . Tuberculosis Mother   . Kidney failure Father   . Hypertension Paternal Aunt   . Diabetes Paternal Aunt   . Hypertension Paternal Uncle   . Diabetes Paternal Uncle   . Hypertension Daughter   . Stroke Daughter     CURRENT MEDICATIONS:  Outpatient Encounter Medications as of 11/02/2017  Medication Sig  . acetaminophen (TYLENOL) 650 MG CR tablet Take 1,300 mg by mouth every 8 (eight) hours as needed for pain.  [name] amLODipine (NORVASC) 5 MG tablet Take 5 mg by mouth daily.   . cyclobenzaprine (FLEXERIL) 5 MG tablet cyclobenzaprine 5 mg tablet  TAKE 1 TO 2 TABLETS BY MOUTH EVERY 6 HOURS AS NEEDED FOR MUSCLE SPASM  . diazepam (VALIUM)  5 MG tablet Take 1-2 tablets (5-10 mg total) by mouth every 6 (six) hours as needed for muscle spasms.  . hydrochlorothiazide (HYDRODIURIL) 25 MG tablet Take 25 mg by mouth daily.  [name] HYDROcodone-acetaminophen (NORCO/VICODIN) 5-325 MG tablet hydrocodone 5 mg-acetaminophen 325 mg tablet  . naproxen (NAPROSYN) 500 MG tablet naproxen 500 mg tablet  TAKE 1 TABLET BY MOUTH TWICE A DAY  . ondansetron (ZOFRAN) 4 MG tablet Take 1 tablet (4 mg total) by mouth every 8 (eight) hours as needed for nausea or vomiting.  . predniSONE (DELTASONE) 10 MG tablet prednisone 10 mg tablet   No facility-administered encounter medications on file as of 11/02/2017.     ALLERGIES:  No Known Allergies   PHYSICAL EXAM:  ECOG Performance status: 1  Vitals:   11/02/17 1112  BP: (!) 119/49  Pulse: 98  Resp: 16  Temp: 98.7 F (37.1 C)  SpO2: 100%    Physical Exam   LABORATORY DATA:  I have reviewed the labs as listed.  CBC    Component Value Date/Time   WBC 15.3 (H) 10/19/2017 1425   RBC 3.00 (L) 10/19/2017 1425   HGB 9.6 (L) 10/19/2017 1425   HCT 28.8 (L) 10/19/2017 1425   PLT 236 10/19/2017 1425   MCV 96.0 10/19/2017 1425   MCH 32.0 10/19/2017 1425   MCHC 33.3 10/19/2017 1425   RDW 14.8 10/19/2017 1425   LYMPHSABS 1.7 10/19/2017 1425   MONOABS 1.5 (H) 10/19/2017 1425   EOSABS 0.1 10/19/2017 1425   BASOSABS 0.0 10/19/2017 1425   CMP Latest Ref Rng & Units 10/07/2017 10/06/2017  Glucose 70 - 99 mg/dL 207(H) 122(H)  BUN 8 - 23 mg/dL 14 19  Creatinine 0.44 - 1.00 mg/dL 0.88 0.77  Sodium 135 - 145 mmol/L 129(L) 130(L)  Potassium 3.5 - 5.1 mmol/L 3.9 3.8  Chloride 98 - 111 mmol/L 93(L) 94(L)  CO2 22 - 32 mmol/L 20(L) 24  Calcium 8.9 - 10.3 mg/dL 9.6 9.7  Total Protein 6.5 - 8.1 g/dL - 9.9(H)  Total Bilirubin 0.3 - 1.2 mg/dL - 0.5  Alkaline Phos 38 - 126 U/L - 121  AST 15 - 41 U/L - 20  ALT 0 - 44 U/L - 26       DIAGNOSTIC IMAGING:  *The following radiologic images and reports have been  reviewed independently and agree with below findings.      ASSESSMENT & PLAN:   No problem-specific Assessment & Plan notes found for this encounter.      Orders placed this encounter:  No orders of the defined types were placed in this encounter.     Jamie Jack, MD [facility] [phone]  

## 2017-11-02 NOTE — Progress Notes (Signed)
START ON PATHWAY REGIMEN - Multiple Myeloma and Other Plasma Cell Dyscrasias     A cycle is every 21 days:     Bortezomib      Lenalidomide      Dexamethasone   **Always confirm dose/schedule in your pharmacy ordering system**  Patient Characteristics: Newly Diagnosed, Transplant Eligible, Standard Risk R-ISS Staging: II Disease Classification: Newly Diagnosed Is Patient Eligible for Transplant<= Transplant Eligible Risk Status: Standard Risk Intent of Therapy: Non-Curative / Palliative Intent, Discussed with Patient 

## 2017-11-02 NOTE — Telephone Encounter (Signed)
Faxed Revlimid rx to [facility] rx

## 2017-11-02 NOTE — Patient Instructions (Addendum)
[place] at [facility] Discharge Instructions  Follow up in 2 weeks to begin treatment. Start Asprin 81mg , start calcium and vit D once in the morning once at night   Thank you for choosing [place] at [facility] to provide your oncology and hematology care.  To afford each patient quality time with our provider, please arrive at least 15 minutes before your scheduled appointment time.   If you have a lab appointment with the [facility] please come in thru the  Main Entrance and check in at the main information desk  You need to re-schedule your appointment should you arrive 10 or more minutes late.  We strive to give you quality time with our providers, and arriving late affects you and other patients whose appointments are after yours.  Also, if you no show three or more times for appointments you may be dismissed from the clinic at the providers discretion.     Again, thank you for choosing [facility].  Our hope is that these requests will decrease the amount of time that you wait before being seen by our physicians.       _____________________________________________________________  Should you have questions after your visit to [facility], please contact our office at [phone] between the hours of 8:00 a.m. and 4:30 p.m.  Voicemails left after 4:00 p.m. will not be returned until the following business day.  For prescription refill requests, have your pharmacy contact our office and allow 72 hours.    Cancer Center Support Programs:   > Cancer Support Group  2nd Tuesday of the month 1pm-2pm, Journey Room

## 2017-11-03 ENCOUNTER — Ambulatory Visit
Admission: RE | Admit: 2017-11-03 | Discharge: 2017-11-03 | Disposition: A | Discharge status: Home / Self Care | Payer: Medicare HMO | Source: Ambulatory Visit | Attending: Radiation Oncology | Admitting: Radiation Oncology

## 2017-11-03 DIAGNOSIS — C720 Malignant neoplasm of spinal cord: Secondary | ICD-10-CM | POA: Diagnosis not present

## 2017-11-03 DIAGNOSIS — C7951 Secondary malignant neoplasm of bone: Secondary | ICD-10-CM | POA: Diagnosis not present

## 2017-11-03 DIAGNOSIS — M8458XD Pathological fracture in neoplastic disease, other specified site, subsequent encounter for fracture with routine healing: Secondary | ICD-10-CM | POA: Diagnosis not present

## 2017-11-03 DIAGNOSIS — M4804 Spinal stenosis, thoracic region: Secondary | ICD-10-CM | POA: Diagnosis not present

## 2017-11-03 DIAGNOSIS — M519 Unspecified thoracic, thoracolumbar and lumbosacral intervertebral disc disorder: Secondary | ICD-10-CM | POA: Diagnosis not present

## 2017-11-03 DIAGNOSIS — C9000 Multiple myeloma not having achieved remission: Secondary | ICD-10-CM | POA: Diagnosis not present

## 2017-11-03 DIAGNOSIS — Z510 Encounter for antineoplastic radiation therapy: Secondary | ICD-10-CM | POA: Diagnosis not present

## 2017-11-03 DIAGNOSIS — I10 Essential (primary) hypertension: Secondary | ICD-10-CM | POA: Diagnosis not present

## 2017-11-05 ENCOUNTER — Ambulatory Visit: Admission: RE | Admit: 2017-11-05 | Payer: Medicare HMO | Source: Ambulatory Visit

## 2017-11-06 ENCOUNTER — Ambulatory Visit
Admission: RE | Admit: 2017-11-06 | Discharge: 2017-11-06 | Disposition: A | Discharge status: Home / Self Care | Payer: Medicare HMO | Source: Ambulatory Visit | Attending: Radiation Oncology | Admitting: Radiation Oncology

## 2017-11-06 DIAGNOSIS — C9000 Multiple myeloma not having achieved remission: Secondary | ICD-10-CM | POA: Diagnosis not present

## 2017-11-06 DIAGNOSIS — C7951 Secondary malignant neoplasm of bone: Secondary | ICD-10-CM | POA: Diagnosis not present

## 2017-11-06 DIAGNOSIS — Z510 Encounter for antineoplastic radiation therapy: Secondary | ICD-10-CM | POA: Diagnosis not present

## 2017-11-07 ENCOUNTER — Ambulatory Visit
Admission: RE | Admit: 2017-11-07 | Discharge: 2017-11-07 | Disposition: A | Discharge status: Home / Self Care | Payer: Medicare HMO | Source: Ambulatory Visit | Attending: Radiation Oncology | Admitting: Radiation Oncology

## 2017-11-07 DIAGNOSIS — M8458XD Pathological fracture in neoplastic disease, other specified site, subsequent encounter for fracture with routine healing: Secondary | ICD-10-CM | POA: Diagnosis not present

## 2017-11-07 DIAGNOSIS — M519 Unspecified thoracic, thoracolumbar and lumbosacral intervertebral disc disorder: Secondary | ICD-10-CM | POA: Diagnosis not present

## 2017-11-07 DIAGNOSIS — C9000 Multiple myeloma not having achieved remission: Secondary | ICD-10-CM | POA: Diagnosis not present

## 2017-11-07 DIAGNOSIS — M4804 Spinal stenosis, thoracic region: Secondary | ICD-10-CM | POA: Diagnosis not present

## 2017-11-07 DIAGNOSIS — C720 Malignant neoplasm of spinal cord: Secondary | ICD-10-CM | POA: Diagnosis not present

## 2017-11-07 DIAGNOSIS — C7951 Secondary malignant neoplasm of bone: Secondary | ICD-10-CM | POA: Diagnosis not present

## 2017-11-07 DIAGNOSIS — Z510 Encounter for antineoplastic radiation therapy: Secondary | ICD-10-CM | POA: Diagnosis not present

## 2017-11-07 DIAGNOSIS — I10 Essential (primary) hypertension: Secondary | ICD-10-CM | POA: Diagnosis not present

## 2017-11-08 ENCOUNTER — Ambulatory Visit
Admission: RE | Admit: 2017-11-08 | Discharge: 2017-11-08 | Disposition: A | Discharge status: Home / Self Care | Payer: Medicare HMO | Source: Ambulatory Visit | Attending: Radiation Oncology | Admitting: Radiation Oncology

## 2017-11-08 DIAGNOSIS — I10 Essential (primary) hypertension: Secondary | ICD-10-CM | POA: Diagnosis not present

## 2017-11-08 DIAGNOSIS — C720 Malignant neoplasm of spinal cord: Secondary | ICD-10-CM | POA: Diagnosis not present

## 2017-11-08 DIAGNOSIS — M4804 Spinal stenosis, thoracic region: Secondary | ICD-10-CM | POA: Diagnosis not present

## 2017-11-08 DIAGNOSIS — C9000 Multiple myeloma not having achieved remission: Secondary | ICD-10-CM | POA: Diagnosis not present

## 2017-11-08 DIAGNOSIS — Z510 Encounter for antineoplastic radiation therapy: Secondary | ICD-10-CM | POA: Diagnosis not present

## 2017-11-08 DIAGNOSIS — M519 Unspecified thoracic, thoracolumbar and lumbosacral intervertebral disc disorder: Secondary | ICD-10-CM | POA: Diagnosis not present

## 2017-11-08 DIAGNOSIS — M8458XD Pathological fracture in neoplastic disease, other specified site, subsequent encounter for fracture with routine healing: Secondary | ICD-10-CM | POA: Diagnosis not present

## 2017-11-08 DIAGNOSIS — C7951 Secondary malignant neoplasm of bone: Secondary | ICD-10-CM | POA: Diagnosis not present

## 2017-11-08 NOTE — Progress Notes (Signed)
Teaching pulled together for revlimid velcade and dexamethasone.

## 2017-11-08 NOTE — Patient Instructions (Signed)
[facility] Chemotherapy Teaching   You have been diagnosed with multiple myeloma.  We are going to treat you with the intent of controlling your disease.  This means that it is treatable but not curable.  You will be treated with the regimen RVD. That is Revlimid, Velcade, Decadron.  The Revlimid will be taken for 14 days and then 7 days off.  The Velcade will be given as an injection on days 1,8,15 every 21 days and the Decadron will be taken the same on days 1,8,15 every 21 days.  You will see the doctor regularly throughout treatment.  We monitor your lab work prior to every treatment. The doctor monitors your response to treatment by the way you are feeling, your blood work, and scans periodically.  There will be wait times while you are here for treatment.  It will take about 30 minutes to 1 hour for your lab work to result.  Then there will be wait times while pharmacy mixes your medications.  Medications that you will take prior to receiving the Velcade injection: Compazine: nausea medication to prevent chemotherapy induced nausea   Bortezomib (Velcade)  About This Drug Bortezomib is used to treat cancer. It is given in the vein (IV) or by a shot under the skin (subcutaneously).  Possible Side Effects . Bone marrow suppression. Decrease in the number of white blood cells, red blood cells, and platelets. This may raise your risk of infection, make you tired and weak (fatigue), and raise your risk of bleeding. . Nausea and vomiting (throwing up) . Constipation (not able to move bowels) . Diarrhea (loose bowel movements) . Fever . Tiredness . Decreased appetite (decreased hunger) . Effects on the nerves are called peripheral neuropathy. You may feel numbness, tingling, or pain in your hands and feet. It may be hard for you to button your clothes, open jars, or walk as usual. The effect on the nerves may get worse with more doses of the drug. These effects get better in  some people after the drug is stopped but it does not get better in all people. . Rash  Note: Each of the side effects above was reported in 20% or greater of patients treated with bortezomib. Not all possible side effects are included above.  Warnings and Precautions . Severe peripheral neuropathy . Low blood pressure . Congestive heart failure - your heart has less ability to pump blood properly. . Trouble breathing because of fluid build-up and/or inflammation in your lungs . Nausea, vomiting, diarrhea and constipation which sometimes requires treatment to help lessen these side effects. There is also an increased risk of developing a partial or complete blockage of your small and/or large intestine. . Changes in your central nervous system can happen. The central nervous system is made up of your brain and spinal cord. You could feel extreme tiredness, agitation, confusion, have hallucinations (see or hear things that are not there), trouble understanding or speaking, loss of control of your bowels or bladder, eyesight changes, numbness or lack of strength to your arms, legs, face, or body, seizures or coma. If you start to have any of these symptoms let your doctor know right away. . Tumor lysis syndrome: This drug may act on the cancer cells very quickly. This may affect how your kidneys work. . Changes in your liver function Increased risk of a syndrome that affects your red blood cells, platelets and blood vessels in your kidneys, which can cause kidney failure and be  life-threatening.  Important Information . This drug may be present in the saliva, tears, sweat, urine, stool, vomit, semen, and vaginal secretions. Talk to your doctor and/or your nurse about the necessary precautions to take during this time. . This drug may impair your ability to drive or use machinery. Use caution and tell your nurse or doctor if you feel dizzy, very sleepy, and/or experience low blood  pressure.  Treating Side Effects . Manage tiredness by pacing your activities for the day. . Be sure to include periods of rest between energy-draining activities. . To decrease the risk of infection, wash your hands regularly. . Avoid close contact with people who have a cold, the flu, or other infections. . Take your temperature as your doctor or nurse tells you, and whenever you feel like you may have a fever. . To help decrease the risk of bleeding, use a soft toothbrush. Check with your nurse before using dental floss. . Be very careful when using knives or tools. . Use an electric shaver instead of a razor. . Ask your doctor or nurse about medicines that are available to help stop or lessen constipation. . If you are not able to move your bowels, check with your doctor or nurse before you use enemas, laxatives, or suppositories. . Drink plenty of fluids (a minimum of eight glasses per day is recommended). . If you throw up or have loose bowel movements, you should drink more fluids so that you do not become dehydrated (lack of water in the body from losing too much fluid). . If you have diarrhea, eat low-fiber foods that are high in protein and calories and avoid foods that can irritate your digestive tracts or lead to cramping. . Ask your nurse or doctor about medicine that can lessen or stop your diarrhea. . To help with nausea and vomiting, eat small, frequent meals instead of three large meals a day. Choose foods and drinks that are at room temperature. Ask your nurse or doctor about other helpful tips and medicine that is available to help stop or lessen these symptoms. . To help with decreased appetite, eat foods high in calories and protein, such as meat, poultry, fish, dry beans, tofu, eggs, nuts, milk, yogurt, cheese, ice cream, pudding, and nutritional supplements. . Consider using sauces and spices to increase taste. Daily exercise, with your doctor's approval, may  increase your appetite. . If you have numbness and tingling in your hands and feet, be careful when cooking, walking, and handling sharp objects and hot liquids. . If you get a rash do not put anything on it unless your doctor or nurse says you may. Keep the area around the rash clean and dry. Ask your doctor for medicine if your rash bothers you.  Food and Drug Interactions . This drug may interact with grapefruit and grapefruit juice. Talk to your doctor as this could make side effects worse. . Check with your doctor or pharmacist about all other prescription medicines and over-the-counter medicines and dietary supplements (vitamins, minerals, herbs and others) you are taking before starting this medicine as there are known drug interactions with bortezomib. Also, check with your doctor or pharmacist before starting any new prescription or over-the-counter medicines, or dietary supplements to make sure that there are no interactions. . Avoid the use of St. John's Wort with bortezomib as this may lower the levels of the drug in your body, which can make it less effective.  When to Call the Doctor Call your doctor  or nurse if you have any of these symptoms and/or any new or unusual symptoms: . Fever of 100.4 F (38 C) or higher . Chills . Tiredness that interferes with your daily activities . Feeling dizzy or lightheaded . Feeling that your heart is beating in a fast or not normal way (palpitations) . Cough . Wheezing or trouble breathing . Easy bleeding or bruising . Confusion and/or agitation . Hallucinations . Trouble understanding or speaking . Blurry vision or changes in your eyesight . Numbness or lack of strength to your arms, legs, face, or body . Symptoms of a seizure such as confusion, blacking out, passing out, loss of hearing or vision, blurred vision, unusual smells or tastes (such as burning rubber), trouble talking, tremors or shaking in parts or all of the body,  repeated body movements, tense muscles that do not relax, and loss of control of urine and bowels. If you or your family member suspects you are having a seizure, call 911 right away. . Nausea that stops you from eating or drinking and/or is not relieved by prescribed medicines . Throwing up more than 3 times a day . Lasting loss of appetite or rapid weight loss of five pounds in a week . No bowel movement in 3 days or when you feel uncomfortable. . Abdominal pain that does not go away . Diarrhea, 4 times in one day or diarrhea with lack of strength or a feeling of being dizzy . Numbness, tingling, or pain your hands and feet . Swelling of legs, ankles, and/or feet . Weight gain of 5 pounds in one week (fluid retention) . Decreased urine, or very dark urine . New rash and/or itching . Rash that is not relieved by prescribed medicines . Signs of tumor lysis: Confusion or agitation, decreased urine, nausea/vomiting, diarrhea, muscle cramping, numbness and/or tingling, seizures. . Signs of possible liver problems: dark urine, pale bowel movements, bad stomach pain, feeling very tired and weak, unusual itching, or yellowing of the eyes or skin . If you think you are pregnant or may have impregnated your partner  Reproduction Warnings . Pregnancy warning: This drug can have harmful effects on the unborn baby. Women of child bearing potential should use effective methods of birth control during your cancer treatment and for at least 7 months after treatment. Men with female partners of childbearing potential should use effective methods of birth control during your cancer treatment and for at least 4 months after your cancer treatment. Let your doctor know right away if you think you may be pregnant or may have impregnated your partner. . Breastfeeding warning: Women should not breastfeed during treatment and for 2 months month after treatment because this drug could enter the breast milk and  cause harm to a breastfeeding baby. . Fertility warning: In men and women both, this drug may affect your ability to have children in the future. Talk with your doctor or nurse if you plan to have children. Ask for information on sperm or egg banking.  Dexamethasone (Decadron)  About This Drug Dexamethasone is used to treat cancer, to decrease inflammation and sometimes used before and after chemotherapy to prevent or treat nausea and/or vomiting. It is given in the vein (IV) or orally (by mouth).  Possible Side Effects . Headache . High blood pressure . Abnormal heart beat . Tiredness and weakness . Changes in mood, which may include depression or a feeling of extreme well-being . Trouble sleeping . Increased sweating . Increased appetite (increased hunger) .  Weight gain . Increase risk of infections . Pain in your abdomen . Nausea . Skin changes such as rash, dryness, redness . Blood sugar levels may change . Electrolyte changes . Swelling of your legs, ankles and/or feet . Changes in your liver function . You may be at risk for cataracts, glaucoma or infections of the eye . Muscle loss and / or weakness (lack of muscle strength) . Increased risk of developing osteoporosis- your bones may become weak and brittle  Note: Not all possible side effects are included above.  Warnings and Precautions . This drug may cause you to feel irritable, nervous or restless. . Allergic reactions, including anaphylaxis are rare but may happen in some patients. Signs of allergic reaction to this drug may be swelling of the face, feeling like your tongue or throat are swelling, trouble breathing, rash, itching, fever, chills, feeling dizzy, and/or feeling that your heart is beating in a fast or not normal way. If this happens, do not take another dose of this drug. You should get urgent medical treatment. . High blood pressure and changes in electrolytes, which can cause fluid build-up  around your heart, lungs or elsewhere. . Increased risk of developing a hole in your stomach, small, and/or large intestine if you have ulcers in the lining of your stomach and/or intestine, or have diverticulitis, ulcerative colitis and/or other diseases that affect the gastrointestinal tract. . Effects on the endocrine glands including the pituitary, adrenals or thyroid during or after use of this medication. . Changes in the tissue of the heart, that can cause your heart to have less ability to pump blood. You may be short of breath or our arms, hands, legs and feet may swell. . Increased risk of heart attack. . Severe depression and other psychiatric disorders such as mood changes. . Burning, pain and itching around your anus may happen when this drug is given in the vein too rapidly (IV). It usually happens suddenly and resolves in less than 1 minute.  Important Information . Talk to your doctor or your nurse before stopping this medication, it should be stopped gradually. Depending on the dose and length of treatment, you could experience serious side effects if stopped abruptly (suddenly). . Talk to your doctor before receiving any vaccinations during your treatment. Some vaccinations are not recommended while receiving dexamethasone.  How to Take Your Medication . For Oral (by mouth): You can take the medicine with or without food. If you have nausea or upset stomach, take it with food. . Missed dose: If you miss a dose, do not take 2 doses at the same time or extra doses. . If you vomit a dose, take your next dose at the regular time. Do not take 2 doses at the same time . Handling: Wash your hands after handling your medicine, your caretakers should not handle your medicine with bare hands and should wear latex gloves. . Storage: Store this medicine in the original container at room temperature. Protect from moisture and light. Discuss with your nurse or your doctor how to  dispose of unused medicine.  Treating Side Effects . Drink plenty of fluids (a minimum of eight glasses per day is recommended). . To help with nausea and vomiting, eat small, frequent meals instead of three large meals a day. Choose foods and drinks that are at room temperature. Ask your nurse or doctor about other helpful tips and medicine that is available to help stop or lessen these symptoms. . If you  throw up, you should drink more fluids so that you do not become dehydrated (lack of water in the body from losing too much fluid). . Manage tiredness by pacing your activities for the day. . Be sure to include periods of rest between energy-draining activities. . To help with muscle weakness, get regular exercise. If you feel too tired to exercise vigorously, try taking a short walk. . If you are having trouble sleeping, talk to your nurse or doctor on tips to help you sleep better. . If you are feeling depressed, talk to your nurse or doctor about it. [name] Keeping your pain under control is important to your well-being. Please tell your doctor or nurse if you are experiencing pain. . If you have diabetes, keep good control of your blood sugar level. Tell your nurse or your doctor if your glucose levels are higher or lower than normal. . To decrease the risk of infection, wash your hands regularly. . Avoid close contact with people who have a cold, the flu, or other infections. . Take your temperature as your doctor or nurse tells you, and whenever you feel like you may have a fever. . If you get a rash do not put anything on it unless your doctor or nurse says you may. Keep the area around the rash clean and dry. Ask your doctor for medicine if your rash bothers you. . Moisturize your skin several times day. . Avoid sun exposure and apply sunscreen routinely when outdoors.  Food and Drug Interactions . There are no known interactions of dexamethasone with food. . Check with your doctor  or pharmacist about all other prescription medicines and over-the-counter medicines and dietary supplements (vitamins, minerals, herbs and others) you are taking before starting this medicine as there are known drug interactions with dexamethasone. Also, check with your doctor or pharmacist before starting any new prescription or over-the-counter medicines, or dietary supplement to make sure that there are no interactions. . There are known interactions of dexamethasone with other medicines and products like acetaminophen, aspirin, and ibuprofen. Ask your doctor what over-the-counter (OTC) medicines you can take.  When to Call the Doctor Call your doctor or nurse if you have any of these symptoms and/or any new or unusual symptoms: . Fever of 100.4 F (38 C) or higher . Chills . A headache that does not go away . Trouble breathing . Blurry vision or other changes in eyesight . Feel irritable, nervous or restless . Trouble falling or staying asleep . Severe mood changes such as depression or unusual thoughts and/or behaviors . Thoughts of hurting yourself or others, and suicide . Tiredness that interferes with your daily activities . Feeling that your heart is beating in a fast, slow or not normal way . Feeling dizzy or lightheaded . Chest pain or symptoms of a heart attack. Most heart attacks involve pain in the center of the chest that lasts more than a few minutes. The pain may go away and come back, or it can be constant. It can feel like pressure, squeezing, fullness, or pain. Sometimes pain is felt in one or both arms, the back, neck, jaw, or stomach. If any of these symptoms last 2 minutes, call 911. [name] Heartburn or indigestion . Nausea that stops you from eating or drinking and/or is not relieved by prescribed medicines . Throwing up more than 3 times a day . Pain in your abdomen that does not go away . Abnormal blood sugar . Unusual thirst, passing urine  often, headache,  sweating, shakiness, irritability . Swelling of legs, ankles, or feet . Weight gain of 5 pounds in one week (fluid retention) . Signs of possible liver problems: dark urine, pale bowel movements, bad stomach pain, feeling very tired and weak, unusual itching, or yellowing of the eyes or skin . Severe muscle weakness . A new rash or a rash that is not relieved by prescribed medicines . Signs of allergic reaction: swelling of the face, feeling like your tongue or throat are swelling, trouble breathing, rash, itching, fever, chills, feeling dizzy, and/or feeling that your heart is beating in a fast or not normal way. If this happens, call 911 for emergency care. . If you think you may be pregnant  Reproduction Warnings . Pregnancy warning: It is not known if this drug may harm an unborn child. For this reason, be sure to talk with your doctor if you are pregnant or planning to become pregnant while receiving this drug. Let your doctor know right away if you think you may be pregnant or may have impregnated your partner. . Breastfeeding warning: It is not known if this drug passes into breast milk. For this reason, women should talk to their doctor about the risks and benefits of breastfeeding during treatment with this drug because this drug may enter the breast milk and cause harm to a breastfeeding baby. . Fertility warning: Human fertility studies have not been done with this drug. Talk with your doctor or nurse if you plan to have children. Ask for information on sperm banking.  Lenalidomide (Revlimid)  About This Drug Lenalidomide is used to treat cancer. It is given orally (by mouth).  Possible Side Effects . Bone marrow suppression. This is a decrease in the number of white blood cells, red blood cells, and platelets. This may raise your risk of infection, make you tired and weak (fatigue), and raise your risk of bleeding . Nausea . Diarrhea (loose bowel movements) .  Constipation (unable to move bowels) . Inflammation of your stomach and/or intestines . Pain in your abdomen . Fever . Tiredness and weakness . Swelling of your legs, ankles and/or feet . Decreased appetite (decreased hunger) . Muscle cramps/spasms . Back pain . Pain in your joints . Headache . Feeling dizzy . Tremor . Trouble sleeping . Nosebleed . Upper respiratory infection, bronchitis . Inflammation of the nasal passages and throat . Trouble breathing . Cough . Rash and itching  Note: Each of the side effects above was reported in 15% or greater of patients treated with lenalidomide. Not all possible side effects are included above.  Warnings and Precautions . Blood clots and events such as stroke and heart attack. A blood clot in your leg may cause your leg to swell, appear red and warm, and/or cause pain. A blood clot in your lungs may cause trouble breathing, pain when breathing, and/or chest pain. . Severe bone marrow suppression. . Changes in your liver function, which may cause liver failure and be life-threatening. . Tumor lysis syndrome: This drug may act on the cancer cells very quickly. This may affect how your kidneys work and can be life-threatening. . Changes in your thyroid function . Severe allergic skin reaction which may be life-threatening. You may develop blisters on your skin that are filled with fluid or a severe red rash all over your body that may be painful. . This drug may raise your risk of getting a second cancer. . You may develop a syndrome called tumor flare  reaction. You may have painful lymph nodes, enlarged spleen, fever and a rash. . This drug may make it more difficult to collect your stem cells if an autologous stem cell transplant is part of your treatment plan. . There is a rare increased risk of death in patients with chronic lymphocytic leukemia and a risk of early death (dying sooner) in patient with mantle cell lymphoma.  Note:  Some of the side effects above are very rare. If you have concerns and/or questions, please discuss them with your medical team.  Important Information . You will need to sign up for a special program called Revlimid REMS when you start taking this drug. Your nurse will help you get started. . Do not donate blood during your treatment and for 4 weeks after your treatment. . Men should not donate sperm during your treatment and for 4 weeks after your treatment because this drug is present in semen and may badly harm a baby.  How to Take Your Medication . Swallow the medicine whole with water, with or without food. Do not chew, break, or open it. . Take this medicine at the same time each day . Missed dose: If you miss a dose, take it as soon as you think about it ONLY if it has been less than 12 hours since your regular time. If it has been more than 12 hours, skip the missed dose and contact your physician. Take your next dose at the regular time. Do not take 2 doses at the same time and do not double up on the next dose. . If you vomit a dose, take your next dose at the regular time. . Handling: Wash your hands after handling your medicine, your caretakers should not handle your medicine with bare hands and should wear latex gloves. . If you get any of the content of a broken capsules on your skin, you should wash the area of the skin well with soap and water right away. Call your doctor if you get a skin reaction. . This drug may be present in the saliva, tears, sweat, urine, stool, vomit, semen, and vaginal secretions. Talk to your doctor and/or your nurse about the necessary precautions to take during this time. . Storage: Store this medicine in the original container at room temperature. . Disposal of unused medicine: Do not flush any expired and/or unused medicine down the toilet or drain unless you are specifically instructed to do so on the medication label. Some facilities  have take-back programs and/or other options. If you do not have a take-back program in your area, then please discuss with your nurse or your doctor how to dispose of unused medicine.  Treating Side Effects . Manage tiredness by pacing your activities for the day. . Be sure to include periods of rest between energy-draining activities. . If you are dizzy, get up slowly after sitting or lying. . To decrease the risk of infection, wash your hands regularly. . Avoid close contact with people who have a cold, the flu, or other infections. . Take your temperature as your doctor or nurse tells you, and whenever you feel like you may have a fever. . To help decrease the risk of bleeding, use a soft toothbrush. Check with your nurse before using dental floss. . Be very careful when using knives or tools. . Use an electric shaver instead of a razor. . Ask your doctor or nurse about medicines that are available to help stop or lessen constipation. [name]  If you are not able to move your bowels, check with your doctor or nurse before you use enemas, laxatives, or suppositories. . Drink plenty of fluids (a minimum of eight glasses per day is recommended). . Drink fluids that contribute calories (whole milk, juice, soft drinks, sweetened beverages, milkshakes, and nutritional supplements) instead of water. . Include a source of protein at every meal and snack, such as meat, poultry, fish, dry beans, tofu, eggs, nuts, milk, yogurt, cheese, ice cream, pudding, and nutritional supplements. . If you throw up or have loose bowel movements, you should drink more fluids so that you do not become dehydrated (lack of water in the body from losing too much fluid). . If you have diarrhea, eat low-fiber foods that are high in protein and calories and avoid foods that can irritate your digestive tracts or lead to cramping. . Ask your nurse or doctor about medicine that can lessen or stop your diarrhea. . To help with  nausea and vomiting, eat small, frequent meals instead of three large meals a day. Choose foods and drinks that are at room temperature. Ask your nurse or doctor about other helpful tips and medicine that is available to help stop or lessen these symptoms. . To help with decreased appetite, eat small, frequent meals. . Eat high caloric food such as pudding, ice cream, yogurt and milkshakes. . If you get a rash, do not put anything on it unless your doctor or nurse says you may. Keep the area around the rash clean and dry. Ask your doctor for medicine if your rash bothers you. [name] Keeping your pain under control is important to your well-being. Please tell your doctor or nurse if you are experiencing pain. . If you are having trouble sleeping, talk to your nurse or doctor on tips to help you sleep better. . If you have a nose bleed, sit with your head tipped slightly forward. Apply pressure by lightly pinching the bridge of your nose between your thumb and forefinger. Call your doctor if you feel dizzy or faint or if the bleeding doesn't stop after 10 to 15 minutes. . Moisturize your skin several times a day . Avoid sun exposure and apply sunscreen routinely when outdoors  Food and Drug Interactions . There are no known interactions of lenalidomide with food. . Check with your doctor or pharmacist about all other prescription medicines and over-the-counter medicines and dietary supplements (vitamins, minerals, herbs and others) you are taking before starting this medicine as there are known drug interactions with lenalidomide. Also, check with your doctor or pharmacist before starting any new prescription or over-the-counter medicines, or dietary supplements to make sure that there are no interactions. . There are known interactions of lenalidomide with blood-thinning medicine such as warfarin. Ask your doctor what precautions you should take.  When to Call the Doctor Call your doctor or  nurse if you have any of these symptoms and/or any new or unusual symptoms: . Fever of 100.4 F (38 C) or higher . Chills . Tiredness that interferes with your daily activities . Feeling dizzy or lightheaded . Easy bleeding or bruising . Your leg or arm is swollen, red, warm and/or painful . Headache that does not go away . Nose bleed that doesn't stop bleeding after 10-15 minutes . Painful lymph nodes . Wheezing and/or trouble breathing . Chest pain or symptoms of a heart attack. Most heart attacks involve pain in the center of the chest that lasts more than a few minutes.  The pain may go away and come back. It can feel like pressure, squeezing, fullness, or pain. Sometimes pain is felt in one or both arms, the back, neck, jaw, or stomach. If any of these symptoms last 2 minutes, call 911. [name] Symptoms of a stroke such as sudden numbness or weakness of your face, arm, or leg, mostly on one side of your body; sudden confusion, trouble speaking or understanding; sudden trouble seeing in one or both eyes; sudden trouble walking, feeling dizzy, loss of balance or coordination; or sudden, bad headache with no known cause. If you have any of these symptoms for 2 minutes, call 911. [name] Coughing up yellow, green, or bloody mucus . Feeling that your heart is beating in a fast or not normal way (palpitations) . Nausea that stops you from eating or drinking and/or is not relieved by prescribed medicines . Throwing up more than 3 times a day . Loose bowel movements (diarrhea) 4 times a day or loose bowel movements with lack of strength or a feeling of being dizzy . No bowel movement in 3 days or when you feel uncomfortable . Trouble falling or staying asleep . Pain in your abdomen that does not go away . Weight gain of 5 pounds in one week (fluid retention) . Swelling of your legs, ankles and/or feet . Unexplained weight gain . Lasting loss of appetite or rapid weight loss of five pounds in a  week . Pain that does not go away, or is not relieved by prescribed medicines . Flu-like symptoms: fever, headache, muscle and joint aches, and fatigue (low energy, feeling weak) . A new rash or itching that is not relieved by prescribed medicines . Signs of possible liver problems: dark urine, pale bowel movements, bad stomach pain, feeling very tired and weak, unusual itching, or yellowing of the eyes or skin . Signs of tumor lysis: Confusion or agitation, decreased urine, nausea/vomiting, diarrhea, muscle cramping, numbness and/or tingling, seizures. . If you think you may be pregnant or may have impregnated your partner  Reproduction Warnings . Pregnancy warning: This drug can have harmful effects on the unborn baby. Women of childbearing potential should use 2 effective methods of birth control, one of which, must be a highly effective method of birth control, beginning at least 4 weeks before treatment starts, during your cancer treatment, including dose interruptions, and for at least 4 weeks after treatment. A highly effective method of birth control includes tubal ligation, intra-uterine device (IUD), hormonal (birth control pills, injections, patch and/or implants) or a partner's vasectomy. Let your doctor know right away if you think you may be pregnant . Two negative pregnancy tests are required in women of child-bearing potential prior to starting treatment. . You will need to have routine pregnancy tests while you are taking this drug. . Men with female partners of child-bearing potential should use effective methods of birth control during your cancer treatment and for at least 4 weeks after your cancer treatment. You should always wear a condom even if you have undergone a successful vasectomy. Let your doctor know right away if you think you may have impregnated your partner. . Breastfeeding warning: Women should not breastfeed during treatment because this drug could enter  the breast milk and cause harm to a breastfeeding baby.  SELF CARE ACTIVITIES WHILE ON CHEMOTHERAPY:  Hydration Increase your fluid intake 48 hours prior to treatment and drink at least 8 to 12 cups (64 ounces) of water/decaffeinated beverages per day after treatment.  You can still have your cup of coffee or soda but these beverages do not count as part of your 8 to 12 cups that you need to drink daily. No alcohol intake.  Medications Continue taking your normal prescription medication as prescribed.  If you start any new herbal or new supplements please let us know first to make sure it is safe.  Mouth Care Have teeth cleaned professionally before starting treatment. Keep dentures and partial plates clean. Use soft toothbrush and do not use mouthwashes that contain alcohol. Biotene is a good mouthwash that is available at most pharmacies or may be ordered by calling [phone]. Use warm salt water gargles (1 teaspoon salt per 1 quart warm water) before and after meals and at bedtime. Or you may rinse with 2 tablespoons of three-percent hydrogen peroxide mixed in eight ounces of water. If you are still having problems with your mouth or sores in your mouth please call the clinic. If you need dental work, please let the doctor know before you go for your appointment so that we can coordinate the best possible time for you in regards to your chemo regimen. You need to also let your dentist know that you are actively taking chemo. We may need to do labs prior to your dental appointment.  Skin Care Always use sunscreen that has not expired and with SPF (Sun Protection Factor) of 50 or higher. Wear hats to protect your head from the sun. Remember to use sunscreen on your hands, ears, face, & feet.  Use good moisturizing lotions such as udder cream, eucerin, or even Vaseline. Some chemotherapies can cause dry skin, color changes in your skin and nails.    . Avoid long, hot showers or baths. . Use  gentle, fragrance-free soaps and laundry detergent. . Use moisturizers, preferably creams or ointments rather than lotions because the thicker consistency is better at preventing skin dehydration. Apply the cream or ointment within 15 minutes of showering. Reapply moisturizer at night, and moisturize your hands every time after you wash them.  Hair Loss (if your doctor says your hair will fall out)  . If your doctor says that your hair is likely to fall out, decide before you begin chemo whether you want to wear a wig. You may want to shop before treatment to match your hair color. . Hats, turbans, and scarves can also camouflage hair loss, although some people prefer to leave their heads uncovered. If you go bare-headed outdoors, be sure to use sunscreen on your scalp. . Cut your hair short. It eases the inconvenience of shedding lots of hair, but it also can reduce the emotional impact of watching your hair fall out. . Don't perm or color your hair during chemotherapy. Those chemical treatments are already damaging to hair and can enhance hair loss. Once your chemo treatments are done and your hair has grown back, it's OK to resume dyeing or perming hair. With chemotherapy, hair loss is almost always temporary. But when it grows back, it may be a different color or texture. In older adults who still had hair color before chemotherapy, the new growth may be completely gray.  Often, new hair is very fine and soft.  Infection Prevention Please wash your hands for at least 30 seconds using warm soapy water. Handwashing is the #1 way to prevent the spread of germs. Stay away from sick people or people who are getting over a cold. If you develop respiratory systems such as green/yellow mucus  production or productive cough or persistent cough let us know and we will see if you need an antibiotic. It is a good idea to keep a pair of gloves on when going into grocery stores/[employer] to decrease your risk of  coming into contact with germs on the carts, etc. Carry alcohol hand gel with you at all times and use it frequently if out in public. If your temperature reaches 100.5 or higher please call the clinic and let us know.  If it is after hours or on the weekend please go to the ER if your temperature is over 100.5.  Please have your own personal thermometer at home to use.    Sex and bodily fluids If you are going to have sex, a condom must be used to protect the person that isn't taking chemotherapy. Chemo can decrease your libido (sex drive). For a few days after chemotherapy, chemotherapy can be excreted through your bodily fluids.  When using the toilet please close the lid and flush the toilet twice.  Do this for a few day after you have had chemotherapy.   Effects of chemotherapy on your sex life Some changes are simple and won't last long. They won't affect your sex life permanently. Sometimes you may feel: . too tired . not strong enough to be very active . sick or sore  . not in the mood . anxious or low Your anxiety might not seem related to sex. For example, you may be worried about the cancer and how your treatment is going. Or you may be worried about money, or about how you family are coping with your illness. These things can cause stress, which can affect your interest in sex. It's important to talk to your partner about how you feel. Remember - the changes to your sex life don't usually last long. There's usually no medical reason to stop having sex during chemo. The drugs won't have any long term physical effects on your performance or enjoyment of sex. Cancer can't be passed on to your partner during sex  Contraception It's important to use reliable contraception during treatment. Avoid getting pregnant while you or your partner are having chemotherapy. This is because the drugs may harm the baby. Sometimes chemotherapy drugs can leave a man or woman infertile.  This means you would  not be able to have children in the future. You might want to talk to someone about permanent infertility. It can be very difficult to learn that you may no longer be able to have children. Some people find counselling helpful. There might be ways to preserve your fertility, although this is easier for men than for women. You may want to speak to a fertility expert. You can talk about sperm banking or harvesting your eggs. You can also ask about other fertility options, such as donor eggs. If you have or have had breast cancer, your doctor might advise you not to take the contraceptive pill. This is because the hormones in it might affect the cancer.  It is not known for sure whether or not chemotherapy drugs can be passed on through semen or secretions from the vagina. Because of this some doctors advise people to use a barrier method if you have sex during treatment. This applies to vaginal, anal or oral sex. Generally, doctors advise a barrier method only for the time you are actually having the treatment and for about a week after your treatment. Advice like this can be worrying, but this  does not mean that you have to avoid being intimate with your partner. You can still have close contact with your partner and continue to enjoy sex.  Animals If you have cats or birds we just ask that you not change the litter or change the cage.  Please have someone else do this for you while you are on chemotherapy.   Food Safety During and After Cancer Treatment Food safety is important for people both during and after cancer treatment. Cancer and cancer treatments, such as chemotherapy, radiation therapy, and stem cell/bone marrow transplantation, often weaken the immune system. This makes it harder for your body to protect itself from foodborne illness, also called food poisoning. Foodborne illness is caused by eating food that contains harmful bacteria, parasites, or viruses.  Foods to avoid Some foods have  a higher risk of becoming tainted with bacteria. These include: [name] Unwashed fresh fruit and vegetables, especially leafy vegetables that can hide dirt and other contaminants . Raw sprouts, such as alfalfa sprouts . Raw or undercooked beef, especially ground beef, or other raw or undercooked meat and poultry . Fatty, fried, or spicy foods immediately before or after treatment.  These can sit heavy on your stomach and make you feel nauseous. . Raw or undercooked shellfish, such as oysters. . Sushi and sashimi, which often contain raw fish.  . Unpasteurized beverages, such as unpasteurized fruit juices, raw milk, raw yogurt, or cider . Undercooked eggs, such as soft boiled, over easy, and poached; raw, unpasteurized eggs; or foods made with raw egg, such as homemade raw cookie dough and homemade mayonnaise Simple steps for food safety Shop smart. . Do not buy food stored or displayed in an unclean area. . Do not buy bruised or damaged fruits or vegetables. . Do not buy cans that have cracks, dents, or bulges. . Pick up foods that can spoil at the end of your shopping trip and store them in a cooler on the way home. Prepare and clean up foods carefully. . Rinse all fresh fruits and vegetables under running water, and dry them with a clean towel or paper towel. . Clean the top of cans before opening them. . After preparing food, wash your hands for 20 seconds with hot water and soap. Pay special attention to areas between fingers and under nails. . Clean your utensils and dishes with hot water and soap. [name] Disinfect your kitchen and cutting boards using 1 teaspoon of liquid, unscented bleach mixed into 1 quart of water.   Dispose of old food. . Eat canned and packaged food before its expiration date (the "use by" or "best before" date). . Consume refrigerated leftovers within 3 to 4 days. After that time, throw out the food. Even if the food does not smell or look spoiled, it still may be unsafe.  Some bacteria, such as Listeria, can grow even on foods stored in the refrigerator if they are kept for too long. Take precautions when eating out. . At restaurants, avoid buffets and salad bars where food sits out for a long time and comes in contact with many people. Food can become contaminated when someone with a virus, often a norovirus, or another "bug" handles it. . Put any leftover food in a "to-go" container yourself, rather than having the server do it. And, refrigerate leftovers as soon as you get home. . Choose restaurants that are clean and that are willing to prepare your food as you order it cooked.   MEDICATIONS:  Compazine/Prochlorperazine 589m tablet. Take 1 tablet every 6 hours as needed for nausea/vomiting. (This can make you sleepy)   EMLA cream. Apply a quarter size amount to port site 1 hour prior to chemo. Do not rub in. Cover with plastic wrap.   Over-the-Counter Meds:  Colace - 100 mg capsules - take 2 capsules daily.  If this doesn't help then you can increase to 2 capsules twice daily.  Call u[place]if this does not help your bowels move.   Imodium 278mcapsule. Take 2 capsules after the 1st loose stool and then 1 capsule every 2 hours until you go a total of 12 hours without having a loose stool. Call the [facility] loose stools continue. If diarrhea occurs at bedtime, take 2 capsules at bedtime. Then take 2 capsules every 4 hours until morning. Call [facility]   Diarrhea Sheet   If you are having loose stools/diarrhea, please purchase Imodium and begin taking as outlined:  At the first sign of poorly formed or loose stools you should begin taking Imodium (loperamide) 2 mg capsules.  Take two caplets (89m70mfollowed by one caplet (2mg18mvery 2 hours until you have had no diarrhea for 12 hours.  During  the night take two caplets (89mg)11m bedtime and continue every 4 hours during the night until the morning.  Stop taking Imodium only after there is no sign of diarrhea for 12 hours.    Always call the [facility] are having loose stools/diarrhea that you can't get under control.  Loose stools/diarrhea leads to dehydration (loss of water) in your body.  We have other options of trying to get the loose stools/diarrhea to stop but you must let us knKorea!   Constipation Sheet  Colace - 100 mg capsules - take 2 capsules daily.  If this doesn't help then you can increase to 2 capsules twice daily.  Please call if the above does not work for you.   Do not go more than 2 days without a bowel movement.  It is very important that you do not become constipated.  It will make you feel sick to your stomach (nausea) and can cause abdominal pain and vomiting.   Nausea Sheet   Compazine/Prochlorperazine 10mg 12met. Take 1 tablet every 6 hours as needed for nausea/vomiting. (This can make you sleepy)  If you are having persistent nausea (nausea that does not stop) please call the [facility] kno[place]athe amount of nausea that you are experiencing.  If you begin to vomit, you need to call the [facility] it is the weekend and you have vomited more than one time and can't get it to stop-go to the Emergency Room.  Persistent nausea/vomiting can lead to dehydration (loss of fluid in your body) and will make you feel terrible.   Ice chips, sips of clear liquids, foods that are @ room temperature, crackers, and toast tend to be better tolerated.   SYMPTOMS TO REPORT AS SOON AS POSSIBLE AFTER TREATMENT:   FEVER GREATER THAN 100.5 F  CHILLS WITH OR WITHOUT FEVER  NAUSEA AND VOMITING THAT IS NOT CONTROLLED WITH YOUR NAUSEA MEDICATION  UNUSUAL SHORTNESS OF BREATH  UNUSUAL BRUISING OR BLEEDING  TENDERNESS IN MOUTH AND THROAT WITH OR WITHOUT PRESENCE OF ULCERS  URINARY  PROBLEMS  BOWEL PROBLEMS  UNUSUAL RASH      Wear comfortable clothing and clothing appropriate for easy access to any Portacath or PICC line. Let  us know if there is anything that we can do to make your therapy better!    What to do if you need assistance after hours or on the weekends: CALL [phone].  HOLD on the line, do not hang up.  You will hear multiple messages but at the end you will be connected with a nurse triage line.  They will contact the doctor if necessary.  Most of the time they will be able to assist you.  Do not call the hospital operator.      I have been informed and understand all of the instructions given to me and have received a copy. I have been instructed to call the clinic [phone] or my family physician as soon as possible for continued medical care, if indicated. I do not have any more questions at this time but understand that I may call the [facility] or the Patient Navigator at [phone] during office hours should I have questions or need assistance in obtaining follow-up care.

## 2017-11-09 ENCOUNTER — Ambulatory Visit
Admission: RE | Admit: 2017-11-09 | Discharge: 2017-11-09 | Disposition: A | Discharge status: Home / Self Care | Payer: Medicare HMO | Source: Ambulatory Visit | Attending: Radiation Oncology | Admitting: Radiation Oncology

## 2017-11-09 DIAGNOSIS — Z510 Encounter for antineoplastic radiation therapy: Secondary | ICD-10-CM | POA: Diagnosis not present

## 2017-11-09 DIAGNOSIS — I10 Essential (primary) hypertension: Secondary | ICD-10-CM | POA: Diagnosis not present

## 2017-11-09 DIAGNOSIS — C9000 Multiple myeloma not having achieved remission: Secondary | ICD-10-CM | POA: Diagnosis not present

## 2017-11-09 DIAGNOSIS — C7951 Secondary malignant neoplasm of bone: Secondary | ICD-10-CM | POA: Diagnosis not present

## 2017-11-10 ENCOUNTER — Ambulatory Visit
Admission: RE | Admit: 2017-11-10 | Discharge: 2017-11-10 | Disposition: A | Discharge status: Home / Self Care | Payer: Medicare HMO | Source: Ambulatory Visit | Attending: Radiation Oncology | Admitting: Radiation Oncology

## 2017-11-10 DIAGNOSIS — Z510 Encounter for antineoplastic radiation therapy: Secondary | ICD-10-CM | POA: Diagnosis not present

## 2017-11-10 DIAGNOSIS — I10 Essential (primary) hypertension: Secondary | ICD-10-CM | POA: Diagnosis not present

## 2017-11-10 DIAGNOSIS — M519 Unspecified thoracic, thoracolumbar and lumbosacral intervertebral disc disorder: Secondary | ICD-10-CM | POA: Diagnosis not present

## 2017-11-10 DIAGNOSIS — C720 Malignant neoplasm of spinal cord: Secondary | ICD-10-CM | POA: Diagnosis not present

## 2017-11-10 DIAGNOSIS — C7951 Secondary malignant neoplasm of bone: Secondary | ICD-10-CM | POA: Diagnosis not present

## 2017-11-10 DIAGNOSIS — C9000 Multiple myeloma not having achieved remission: Secondary | ICD-10-CM | POA: Diagnosis not present

## 2017-11-10 DIAGNOSIS — M4804 Spinal stenosis, thoracic region: Secondary | ICD-10-CM | POA: Diagnosis not present

## 2017-11-10 DIAGNOSIS — R269 Unspecified abnormalities of gait and mobility: Secondary | ICD-10-CM | POA: Diagnosis not present

## 2017-11-10 DIAGNOSIS — H52223 Regular astigmatism, bilateral: Secondary | ICD-10-CM | POA: Diagnosis not present

## 2017-11-10 DIAGNOSIS — M8458XD Pathological fracture in neoplastic disease, other specified site, subsequent encounter for fracture with routine healing: Secondary | ICD-10-CM | POA: Diagnosis not present

## 2017-11-13 ENCOUNTER — Telehealth (HOSPITAL_COMMUNITY): Payer: Self-pay | Admitting: Hematology

## 2017-11-13 DIAGNOSIS — M8458XD Pathological fracture in neoplastic disease, other specified site, subsequent encounter for fracture with routine healing: Secondary | ICD-10-CM | POA: Diagnosis not present

## 2017-11-13 DIAGNOSIS — M4804 Spinal stenosis, thoracic region: Secondary | ICD-10-CM | POA: Diagnosis not present

## 2017-11-13 DIAGNOSIS — M519 Unspecified thoracic, thoracolumbar and lumbosacral intervertebral disc disorder: Secondary | ICD-10-CM | POA: Diagnosis not present

## 2017-11-13 DIAGNOSIS — I10 Essential (primary) hypertension: Secondary | ICD-10-CM | POA: Diagnosis not present

## 2017-11-13 DIAGNOSIS — C720 Malignant neoplasm of spinal cord: Secondary | ICD-10-CM | POA: Diagnosis not present

## 2017-11-13 DIAGNOSIS — C9000 Multiple myeloma not having achieved remission: Secondary | ICD-10-CM | POA: Diagnosis not present

## 2017-11-13 NOTE — Telephone Encounter (Signed)
Libby WILL FAX OVER AUTH TO [facility]

## 2017-11-14 ENCOUNTER — Other Ambulatory Visit (HOSPITAL_COMMUNITY): Payer: Self-pay | Admitting: Pharmacist

## 2017-11-14 ENCOUNTER — Encounter (HOSPITAL_COMMUNITY): Payer: Self-pay | Admitting: Hematology

## 2017-11-14 ENCOUNTER — Inpatient Hospital Stay (HOSPITAL_BASED_OUTPATIENT_CLINIC_OR_DEPARTMENT_OTHER): Payer: Medicare HMO | Admitting: Hematology

## 2017-11-14 ENCOUNTER — Inpatient Hospital Stay (HOSPITAL_COMMUNITY): Payer: Medicare HMO

## 2017-11-14 ENCOUNTER — Encounter: Payer: Self-pay | Admitting: Radiation Oncology

## 2017-11-14 ENCOUNTER — Other Ambulatory Visit: Payer: Self-pay

## 2017-11-14 DIAGNOSIS — Z5112 Encounter for antineoplastic immunotherapy: Secondary | ICD-10-CM | POA: Diagnosis not present

## 2017-11-14 DIAGNOSIS — K5900 Constipation, unspecified: Secondary | ICD-10-CM | POA: Diagnosis not present

## 2017-11-14 DIAGNOSIS — Z79899 Other long term (current) drug therapy: Secondary | ICD-10-CM | POA: Diagnosis not present

## 2017-11-14 DIAGNOSIS — C9000 Multiple myeloma not having achieved remission: Secondary | ICD-10-CM | POA: Diagnosis not present

## 2017-11-14 DIAGNOSIS — Z87891 Personal history of nicotine dependence: Secondary | ICD-10-CM | POA: Diagnosis not present

## 2017-11-14 DIAGNOSIS — Z923 Personal history of irradiation: Secondary | ICD-10-CM | POA: Diagnosis not present

## 2017-11-14 DIAGNOSIS — R200 Anesthesia of skin: Secondary | ICD-10-CM | POA: Diagnosis not present

## 2017-11-14 LAB — CBC WITH DIFFERENTIAL/PLATELET
Basophils Absolute: 0 10*3/uL (ref 0.0–0.1)
Basophils Relative: 0 %
EOS ABS: 0.1 10*3/uL (ref 0.0–0.7)
EOS PCT: 2 %
HCT: 27.8 % — ABNORMAL LOW (ref 36.0–46.0)
Hemoglobin: 9 g/dL — ABNORMAL LOW (ref 12.0–15.0)
LYMPHS ABS: 0.6 10*3/uL — AB (ref 0.7–4.0)
Lymphocytes Relative: 9 %
MCH: 30.4 pg (ref 26.0–34.0)
MCHC: 32.4 g/dL (ref 30.0–36.0)
MCV: 93.9 fL (ref 78.0–100.0)
MONOS PCT: 11 %
Monocytes Absolute: 0.7 10*3/uL (ref 0.1–1.0)
Neutro Abs: 4.9 10*3/uL (ref 1.7–7.7)
Neutrophils Relative %: 78 %
PLATELETS: 326 10*3/uL (ref 150–400)
RBC: 2.96 MIL/uL — ABNORMAL LOW (ref 3.87–5.11)
RDW: 16.2 % — ABNORMAL HIGH (ref 11.5–15.5)
WBC: 6.3 10*3/uL (ref 4.0–10.5)

## 2017-11-14 LAB — IRON AND TIBC
IRON: 38 ug/dL (ref 28–170)
Saturation Ratios: 20 % (ref 10.4–31.8)
TIBC: 186 ug/dL — AB (ref 250–450)
UIBC: 148 ug/dL

## 2017-11-14 LAB — COMPREHENSIVE METABOLIC PANEL
ALT: 24 U/L (ref 0–44)
ANION GAP: 14 (ref 5–15)
AST: 27 U/L (ref 15–41)
Albumin: 2.7 g/dL — ABNORMAL LOW (ref 3.5–5.0)
Alkaline Phosphatase: 116 U/L (ref 38–126)
BUN: 13 mg/dL (ref 8–23)
CO2: 24 mmol/L (ref 22–32)
Calcium: 9.2 mg/dL (ref 8.9–10.3)
Chloride: 96 mmol/L — ABNORMAL LOW (ref 98–111)
Creatinine, Ser: 0.71 mg/dL (ref 0.44–1.00)
Glucose, Bld: 190 mg/dL — ABNORMAL HIGH (ref 70–99)
Potassium: 3.2 mmol/L — ABNORMAL LOW (ref 3.5–5.1)
SODIUM: 134 mmol/L — AB (ref 135–145)
TOTAL PROTEIN: 9.1 g/dL — AB (ref 6.5–8.1)
Total Bilirubin: 0.4 mg/dL (ref 0.3–1.2)

## 2017-11-14 LAB — FERRITIN: FERRITIN: 455 ng/mL — AB (ref 11–307)

## 2017-11-14 LAB — LACTATE DEHYDROGENASE: LDH: 122 U/L (ref 98–192)

## 2017-11-14 LAB — HEMOGLOBIN A1C
HEMOGLOBIN A1C: 5.4 % (ref 4.8–5.6)
Mean Plasma Glucose: 108.28 mg/dL

## 2017-11-14 LAB — FOLATE: FOLATE: 16.1 ng/mL (ref >5.9)

## 2017-11-14 LAB — VITAMIN B12: VITAMIN B 12: 633 pg/mL (ref 180–914)

## 2017-11-14 MED ORDER — PROCHLORPERAZINE MALEATE 10 MG PO TABS
ORAL_TABLET | ORAL | Status: AC
  Filled 2017-11-14: qty 1

## 2017-11-14 MED ORDER — PROCHLORPERAZINE MALEATE 10 MG PO TABS
10.0000 mg | ORAL_TABLET | Freq: Once | ORAL | Status: AC
  Administered 2017-11-14: 10 mg via ORAL

## 2017-11-14 MED ORDER — BORTEZOMIB CHEMO SQ INJECTION 3.5 MG (2.5MG/ML)
1.3000 mg/m2 | Freq: Once | INTRAMUSCULAR | Status: AC
  Administered 2017-11-14: 2.5 mg via SUBCUTANEOUS
  Filled 2017-11-14: qty 1

## 2017-11-14 MED ORDER — DEXAMETHASONE 4 MG PO TABS
40.0000 mg | ORAL_TABLET | Freq: Once | ORAL | Status: AC
  Administered 2017-11-14: 40 mg via ORAL
  Filled 2017-11-14: qty 10

## 2017-11-14 NOTE — Progress Notes (Signed)
Jamie Ewing, [place] [address]   CLINIC:  Medical Oncology/Hematology  PCP:  Asencion Noble, MD [address] [place] [address] [phone]   REASON FOR VISIT:  Follow-up for multiple myeloma  CURRENT THERAPY: Velcade, Revlimid, and Dexamethasone  BRIEF ONCOLOGIC HISTORY:    Multiple myeloma ([place])   10/10/2017 Initial Diagnosis    Multiple myeloma ([place])    11/08/2017 -  Chemotherapy    The patient had bortezomib SQ (VELCADE) chemo injection 2.5 mg, 1.3 mg/m2 = 2.5 mg, Subcutaneous,  Once, 0 of 4 cycles  for chemotherapy treatment.       INTERVAL HISTORY:  [name] 77 y.o. female returns for routine follow-up for multiple myeloma and consideration for next cycle of chemotherapy. Patient is here today with her daughter and another family member. She is doing better since radiation was completed on Friday. She gets PT/OT twice a week at home. She wears her back brace while she is standing or moving around. She feels she is starting to get her strength back. She denies pain but has some tightness in her lower back. Patient reports her appetite and energy level are at 50%. She is drinking ensure daily to help maintain her weight. Patient denies any new pain. Denies any nausea, vomiting, or diarrhea. Denies any fevers or infections. Patient has not received her Revlimid pills in the mail yet, she will let us know when they arrive so she can begin taking them.    REVIEW OF SYSTEMS:  Review of Systems  Constitutional: Positive for fatigue.  Musculoskeletal: Positive for back pain.  Neurological: Positive for extremity weakness.  All other systems reviewed and are negative.    PAST MEDICAL/SURGICAL HISTORY:  Past Medical History:  Diagnosis Date  . Back pain   . Hypercholesteremia   . Hypertension    Past Surgical History:  Procedure Laterality Date  . BONE MARROW BIOPSY    . LAMINECTOMY N/A 10/06/2017   Procedure: THORACIC  NINE AND TEN LAMINECTOMY WITH RESECTION OF TUMOR, THORACIC EIGHT TO THORACIC ELEVEN FUSION WITH PEDICLE SCREW FIXATION;  Surgeon: Earnie Larsson, MD;  Location: [facility];  Service: Neurosurgery;  Laterality: N/A;  . TOTAL KNEE ARTHROPLASTY  2001     SOCIAL HISTORY:  Social History   Socioeconomic History  . Marital status: Widowed    Spouse name: Not on file  . Number of children: 2  . Years of education: Not on file  . Highest education level: Not on file  Occupational History  . Not on file  Social Needs  . Financial resource strain: Not very hard  . Food insecurity:    Worry: Never true    Inability: Never true  . Transportation needs:    Medical: No    Non-medical: No  Tobacco Use  . Smoking status: Former Smoker    Packs/day: 0.50    Years: 30.00    Pack years: 15.00    Types: Cigarettes    Last attempt to quit: 05/31/2006    Years since quitting: 11.4  . Smokeless tobacco: Never Used  Substance and Sexual Activity  . Alcohol use: Yes    Comment: wine occassionally  . Drug use: No  . Sexual activity: Not on file  Lifestyle  . Physical activity:    Days per week: 4 days    Minutes per session: 120 min  . Stress: Not at all  Relationships  . Social connections:    Talks on phone:  More than three times a week    Gets together: Once a week    Attends religious service: More than 4 times per year    Active member of club or organization: Yes    Attends meetings of clubs or organizations: More than 4 times per year    Relationship status: Widowed  . Intimate partner violence:    Fear of current or ex partner: No    Emotionally abused: No    Physically abused: No    Forced sexual activity: No  Other Topics Concern  . Not on file  Social History Narrative  . Not on file    FAMILY HISTORY:  Family History  Problem Relation Age of Onset  . Tuberculosis Mother   . Kidney failure Father   . Hypertension Paternal Aunt   . Diabetes Paternal Aunt   . Hypertension  Paternal Uncle   . Diabetes Paternal Uncle   . Hypertension Daughter   . Stroke Daughter     CURRENT MEDICATIONS:  Outpatient Encounter Medications as of 11/14/2017  Medication Sig  . acetaminophen (TYLENOL) 650 MG CR tablet Take 1,300 mg by mouth every 8 (eight) hours as needed for pain.  [name] acyclovir (ZOVIRAX) 400 MG tablet Take 1 tablet (400 mg total) by mouth 2 (two) times daily.  [name] amLODipine (NORVASC) 5 MG tablet Take 5 mg by mouth daily.   . Bortezomib (VELCADE IJ) Inject as directed.  . cyclobenzaprine (FLEXERIL) 5 MG tablet cyclobenzaprine 5 mg tablet  TAKE 1 TO 2 TABLETS BY MOUTH EVERY 6 HOURS AS NEEDED FOR MUSCLE SPASM  . dexamethasone (DECADRON) 4 MG tablet Take 10 tablets (40 mg) on days 1, 8, and 15 of chemo. Repeat every 21 days.  . diazepam (VALIUM) 5 MG tablet Take 1-2 tablets (5-10 mg total) by mouth every 6 (six) hours as needed for muscle spasms.  . hydrochlorothiazide (HYDRODIURIL) 25 MG tablet Take 25 mg by mouth daily.  [name] HYDROcodone-acetaminophen (NORCO/VICODIN) 5-325 MG tablet hydrocodone 5 mg-acetaminophen 325 mg tablet  . lenalidomide (REVLIMID) 25 MG capsule Take one capsule daily on days 1-14 every 21 days.  . naproxen (NAPROSYN) 500 MG tablet naproxen 500 mg tablet  TAKE 1 TABLET BY MOUTH TWICE A DAY  . ondansetron (ZOFRAN) 4 MG tablet Take 1 tablet (4 mg total) by mouth every 8 (eight) hours as needed for nausea or vomiting.  . prochlorperazine (COMPAZINE) 10 MG tablet Take 1 tablet (10 mg total) by mouth every 6 (six) hours as needed (Nausea or vomiting).   No facility-administered encounter medications on file as of 11/14/2017.     ALLERGIES:  No Known Allergies   PHYSICAL EXAM:  ECOG Performance status: 1  Vitals:   11/14/17 1007  BP: 132/68  Pulse: (!) 109  Resp: 18  Temp: 98.1 F (36.7 C)  SpO2: 100%   Filed Weights   11/14/17 1007  Weight: 164 lb (74.4 kg)    Physical Exam  Constitutional: She is oriented to person, place, and time.  She appears well-developed and well-nourished.  Cardiovascular: Normal rate, regular rhythm and normal heart sounds.  Pulmonary/Chest: Effort normal and breath sounds normal.  Neurological: She is alert and oriented to person, place, and time.  Skin: Skin is warm and dry.     LABORATORY DATA:  I have reviewed the labs as listed.  CBC    Component Value Date/Time   WBC 6.3 11/14/2017 0833   RBC 2.96 (L) 11/14/2017 0833   HGB 9.0 (L) 11/14/2017  9562   HCT 27.8 (L) 11/14/2017 0833   PLT 326 11/14/2017 0833   MCV 93.9 11/14/2017 0833   MCH 30.4 11/14/2017 0833   MCHC 32.4 11/14/2017 0833   RDW 16.2 (H) 11/14/2017 0833   LYMPHSABS 0.6 (L) 11/14/2017 0833   MONOABS 0.7 11/14/2017 0833   EOSABS 0.1 11/14/2017 0833   BASOSABS 0.0 11/14/2017 0833   CMP Latest Ref Rng & Units 11/14/2017 10/07/2017 10/06/2017  Glucose 70 - 99 mg/dL 190(H) 207(H) 122(H)  BUN 8 - 23 mg/dL '13 14 19  '$ Creatinine 0.44 - 1.00 mg/dL 0.71 0.88 0.77  Sodium 135 - 145 mmol/L 134(L) 129(L) 130(L)  Potassium 3.5 - 5.1 mmol/L 3.2(L) 3.9 3.8  Chloride 98 - 111 mmol/L 96(L) 93(L) 94(L)  CO2 22 - 32 mmol/L 24 20(L) 24  Calcium 8.9 - 10.3 mg/dL 9.2 9.6 9.7  Total Protein 6.5 - 8.1 g/dL 9.1(H) - 9.9(H)  Total Bilirubin 0.3 - 1.2 mg/dL 0.4 - 0.5  Alkaline Phos 38 - 126 U/L 116 - 121  AST 15 - 41 U/L 27 - 20  ALT 0 - 44 U/L 24 - 26       ASSESSMENT & PLAN:   Multiple myeloma ([facility]) 1.  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 - Again presented to the ER on 10/06/2017 with difficulty walking and tingling on the dorsum of both feet.  MRI of the lumbar spine showed multiple lesions in the thoracic and lumbar vertebrae.  Pathological compression fracture of the T10 vertebral body was seen.  There is an epidural soft tissue mass  measuring 9 x 15 x 44 mm at T10 vertebral body level. -T9-T10 laminectomy with resection of epidural tumor T10 bilateral transpedicular decompression/partial corpectomy on 10/06/2017, pathology consistent with plasma cell neoplasm. -Skeletal survey on 10/12/2017 shows lytic lesions in the skull and left femur. - SPEP shows 2.1 g of IgA kappa monoclonal protein.  Free light chain ratio was 1.69.  Normal renal function with a normal calcium level.  Beta-2 microglobulin was 1.4.  LDH was normal. -She underwent a bone marrow aspiration biopsy on 10/12/2017.  This showed 9% plasma cells in the aspirate and 20% CD38 positive cells on core biopsy.  Chromosome analysis shows 6, XY.  FISH panel positive for +11, 13q-/-13. - Completed radiation to the spine, 20 Gy in 10 fractions on 11/10/2017. - We talked about initiating her on treatment for multiple myeloma.  I have recommended triple drug regimen with VRD.  Unfortunately she does not have Revlimid yet.  We will proceed with her Velcade and dexamethasone starting today.  We talked about side effects.  We also talked about treating her myeloma in the non-curative setting.  She will call us when she gets a shipment of Revlimid. -She will start acyclovir for shingles prophylaxis.  She will also start baby aspirin daily.  2.  Bone strengthening: -We talked about starting her on bone strengthening agents like denosumab given once a month.  We talked about side effects in detail.  Total time spent is 40 minutes with more than 50% of the time spent face-to-face discussing diagnosis, treatment plan, side effects and coordination of care.    Orders placed this encounter:  No orders of the defined types were placed in this encounter.     Derek Jack,  MD [place] [phone]

## 2017-11-14 NOTE — Progress Notes (Signed)
Jamie Ewing presents today for injection per the provider's orders.  Velcade administration without incident; see MAR for injection details.  Patient tolerated procedure well and without incident.  No questions or complaints noted at this time.  Discharged via wheelchair in c/o family.

## 2017-11-14 NOTE — Progress Notes (Signed)
  Radiation Oncology         [phone] ________________________________  Name: Jamie Ewing MRN: [id number]  Date: 11/14/2017  DOB: 08-02-1940  End of Treatment Note  Diagnosis:   77 yo woman with multiple myeloma s/p dorsal laminectomy and decompression for spinal cord compression at T9-T10     Indication for treatment:  Palliative       Radiation treatment dates:   10/30/17 - 11/10/17  Site/dose:   The thoracic spine target at T9-T10 was treated to 20 Gy in 10 fractions of 2 Gy.  Beams/energy:   3D, Photons/ 10X  Narrative: The patient tolerated radiation treatment relatively well.  She denied nausea or vomiting, diarrhea, skin issues, cough, and swallowing difficulty throughout treatments. She reported back and right pelvic pain, and tingling to her feet with movement. She denied tingling in her legs, and no bilateral leg weakness.    Plan: The patient has completed radiation treatment. The patient will return to radiation oncology clinic for routine followup in one month. I advised her to call or return sooner if she has any questions or concerns related to her recovery or treatment. ________________________________  Sheral Apley. Tammi Klippel, M.D.  This document serves as a record of services personally performed by Tyler Pita, MD. It was created on his behalf by Wilburn Mylar, a trained medical scribe. The creation of this record is based on the scribe's personal observations and the provider's statements to them. This document has been checked and approved by the attending provider.

## 2017-11-14 NOTE — Patient Instructions (Signed)
[facility] at [facility] Discharge Instructions     Thank you for choosing [facility] at [facility] to provide your oncology and hematology care.  To afford each patient quality time with our provider, please arrive at least 15 minutes before your scheduled appointment time.   If you have a lab appointment with the Cancer Center please come in thru the  Main Entrance and check in at the main information desk  You need to re-schedule your appointment should you arrive 10 or more minutes late.  We strive to give you quality time with our providers, and arriving late affects you and other patients whose appointments are after yours.  Also, if you no show three or more times for appointments you may be dismissed from the clinic at the providers discretion.     Again, thank you for choosing [facility].  Our hope is that these requests will decrease the amount of time that you wait before being seen by our physicians.       _____________________________________________________________  Should you have questions after your visit to [facility], please contact our office at [phone] between the hours of 8:00 a.m. and 4:30 p.m.  Voicemails left after 4:00 p.m. will not be returned until the following business day.  For prescription refill requests, have your pharmacy contact our office and allow 72 hours.    Cancer Center Support Programs:   > Cancer Support Group  2nd Tuesday of the month 1pm-2pm, Journey Room    

## 2017-11-14 NOTE — Assessment & Plan Note (Signed)
1.  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 -Presented to the emergency room on 09/29/2017 with back pain, normal x-ray of the lumbar spine. - Again presented to the ER on 10/06/2017 with difficulty walking and tingling on the dorsum of both feet.  MRI of the lumbar spine showed multiple lesions in the thoracic and lumbar vertebrae.  Pathological compression fracture of the T10 vertebral body was seen.  There is an epidural soft tissue mass measuring 9 x 15 x 44 mm at T10 vertebral body level. -T9-T10 laminectomy with resection of epidural tumor T10 bilateral transpedicular decompression/partial corpectomy on 10/06/2017, pathology consistent with plasma cell neoplasm. -Skeletal survey on 10/12/2017 shows lytic lesions in the skull and left femur. - SPEP shows 2.1 g of IgA kappa monoclonal protein.  Free light chain ratio was 1.69.  Normal renal function with a normal calcium level.  Beta-2 microglobulin was 1.4.  LDH was normal. -She underwent a bone marrow aspiration biopsy on 10/12/2017.  This showed 9% plasma cells in the aspirate and 20% CD38 positive cells on core biopsy.  Chromosome analysis shows 46, XY.  FISH panel positive for +11, 13q-/-13. - Completed radiation to the spine, 20 Gy in 10 fractions on 11/10/2017. - We talked about initiating her on treatment for multiple myeloma.  I have recommended triple drug regimen with VRD.  Unfortunately she does not have Revlimid yet.  We will proceed with her Velcade and dexamethasone starting today.  We talked about side effects.  We also talked about treating her myeloma in the non-curative setting.  She will call us when she gets a shipment of Revlimid. -She will start acyclovir for shingles prophylaxis.  She will also start baby aspirin daily.  2.  Bone strengthening: -We talked about starting her on bone strengthening agents like denosumab given once a  month.  We talked about side effects in detail. 

## 2017-11-15 DIAGNOSIS — M519 Unspecified thoracic, thoracolumbar and lumbosacral intervertebral disc disorder: Secondary | ICD-10-CM | POA: Diagnosis not present

## 2017-11-15 DIAGNOSIS — M8458XD Pathological fracture in neoplastic disease, other specified site, subsequent encounter for fracture with routine healing: Secondary | ICD-10-CM | POA: Diagnosis not present

## 2017-11-15 DIAGNOSIS — C9000 Multiple myeloma not having achieved remission: Secondary | ICD-10-CM | POA: Diagnosis not present

## 2017-11-15 DIAGNOSIS — M4804 Spinal stenosis, thoracic region: Secondary | ICD-10-CM | POA: Diagnosis not present

## 2017-11-15 DIAGNOSIS — I10 Essential (primary) hypertension: Secondary | ICD-10-CM | POA: Diagnosis not present

## 2017-11-15 DIAGNOSIS — C720 Malignant neoplasm of spinal cord: Secondary | ICD-10-CM | POA: Diagnosis not present

## 2017-11-15 LAB — PROTEIN ELECTROPHORESIS, SERUM
A/G Ratio: 0.5 — ABNORMAL LOW (ref 0.7–1.7)
ALPHA-2-GLOBULIN: 1.2 g/dL — AB (ref 0.4–1.0)
Albumin ELP: 2.8 g/dL — ABNORMAL LOW (ref 2.9–4.4)
Alpha-1-Globulin: 0.4 g/dL (ref 0.0–0.4)
Beta Globulin: 3.5 g/dL — ABNORMAL HIGH (ref 0.7–1.3)
GLOBULIN, TOTAL: 5.7 g/dL — AB (ref 2.2–3.9)
Gamma Globulin: 0.6 g/dL (ref 0.4–1.8)
M-SPIKE, %: 2.4 g/dL — AB
Total Protein ELP: 8.5 g/dL (ref 6.0–8.5)

## 2017-11-16 DIAGNOSIS — C720 Malignant neoplasm of spinal cord: Secondary | ICD-10-CM | POA: Diagnosis not present

## 2017-11-16 DIAGNOSIS — C9000 Multiple myeloma not having achieved remission: Secondary | ICD-10-CM | POA: Diagnosis not present

## 2017-11-16 DIAGNOSIS — M4804 Spinal stenosis, thoracic region: Secondary | ICD-10-CM | POA: Diagnosis not present

## 2017-11-16 DIAGNOSIS — M519 Unspecified thoracic, thoracolumbar and lumbosacral intervertebral disc disorder: Secondary | ICD-10-CM | POA: Diagnosis not present

## 2017-11-16 DIAGNOSIS — M8458XD Pathological fracture in neoplastic disease, other specified site, subsequent encounter for fracture with routine healing: Secondary | ICD-10-CM | POA: Diagnosis not present

## 2017-11-16 DIAGNOSIS — I10 Essential (primary) hypertension: Secondary | ICD-10-CM | POA: Diagnosis not present

## 2017-11-17 ENCOUNTER — Encounter (HOSPITAL_COMMUNITY): Payer: Self-pay | Admitting: *Deleted

## 2017-11-17 ENCOUNTER — Telehealth (HOSPITAL_COMMUNITY): Payer: Self-pay | Admitting: Hematology

## 2017-11-17 NOTE — Progress Notes (Signed)
I spoke with patient via telephone. She states that she is supposed to get her Revlimid today.  I advised her to start taking the medication next Tuesday when she comes for her next Velcade injection.    I have made a calendar for her to keep track of her medications and when to take them. I will give it to her at her next appointment.    Jamie Ewing verbalizes understanding.

## 2017-11-17 NOTE — Telephone Encounter (Signed)
PER [facility] RX CSR PT RECVD COPAY ASSIST AND DELIVERY IS SET UP FOR 11/17/17(TODAY)

## 2017-11-21 ENCOUNTER — Inpatient Hospital Stay (HOSPITAL_BASED_OUTPATIENT_CLINIC_OR_DEPARTMENT_OTHER): Payer: Medicare HMO | Admitting: Hematology

## 2017-11-21 ENCOUNTER — Inpatient Hospital Stay (HOSPITAL_COMMUNITY): Payer: Medicare HMO | Attending: Hematology

## 2017-11-21 ENCOUNTER — Other Ambulatory Visit: Payer: Self-pay

## 2017-11-21 ENCOUNTER — Other Ambulatory Visit (HOSPITAL_COMMUNITY): Payer: Medicare HMO

## 2017-11-21 ENCOUNTER — Inpatient Hospital Stay (HOSPITAL_COMMUNITY): Payer: Medicare HMO

## 2017-11-21 ENCOUNTER — Encounter (HOSPITAL_COMMUNITY): Payer: Self-pay | Admitting: Hematology

## 2017-11-21 VITALS — BP 139/61 | HR 104 | Temp 98.2°F | Resp 18 | Wt 165.8 lb

## 2017-11-21 VITALS — BP 126/55 | HR 65 | Temp 98.6°F | Resp 16

## 2017-11-21 DIAGNOSIS — Z87891 Personal history of nicotine dependence: Secondary | ICD-10-CM | POA: Diagnosis not present

## 2017-11-21 DIAGNOSIS — R200 Anesthesia of skin: Secondary | ICD-10-CM | POA: Diagnosis not present

## 2017-11-21 DIAGNOSIS — Z5112 Encounter for antineoplastic immunotherapy: Secondary | ICD-10-CM | POA: Diagnosis not present

## 2017-11-21 DIAGNOSIS — C9000 Multiple myeloma not having achieved remission: Secondary | ICD-10-CM

## 2017-11-21 DIAGNOSIS — E876 Hypokalemia: Secondary | ICD-10-CM | POA: Diagnosis not present

## 2017-11-21 DIAGNOSIS — Z23 Encounter for immunization: Secondary | ICD-10-CM | POA: Diagnosis not present

## 2017-11-21 LAB — COMPREHENSIVE METABOLIC PANEL
ALT: 16 U/L (ref 0–44)
ANION GAP: 12 (ref 5–15)
AST: 18 U/L (ref 15–41)
Albumin: 3.1 g/dL — ABNORMAL LOW (ref 3.5–5.0)
Alkaline Phosphatase: 123 U/L (ref 38–126)
BILIRUBIN TOTAL: 0.5 mg/dL (ref 0.3–1.2)
BUN: 12 mg/dL (ref 8–23)
CO2: 24 mmol/L (ref 22–32)
Calcium: 9.4 mg/dL (ref 8.9–10.3)
Chloride: 98 mmol/L (ref 98–111)
Creatinine, Ser: 0.68 mg/dL (ref 0.44–1.00)
GFR calc Af Amer: >60 mL/min (ref >60)
Glucose, Bld: 228 mg/dL — ABNORMAL HIGH (ref 70–99)
POTASSIUM: 3.6 mmol/L (ref 3.5–5.1)
Sodium: 134 mmol/L — ABNORMAL LOW (ref 135–145)
TOTAL PROTEIN: 9 g/dL — AB (ref 6.5–8.1)

## 2017-11-21 LAB — CBC WITH DIFFERENTIAL/PLATELET
BASOS ABS: 0 10*3/uL (ref 0.0–0.1)
BASOS PCT: 0 %
Eosinophils Absolute: 0 10*3/uL (ref 0.0–0.7)
Eosinophils Relative: 0 %
HCT: 31.2 % — ABNORMAL LOW (ref 36.0–46.0)
Hemoglobin: 10 g/dL — ABNORMAL LOW (ref 12.0–15.0)
LYMPHS ABS: 1.1 10*3/uL (ref 0.7–4.0)
Lymphocytes Relative: 12 %
MCH: 29.9 pg (ref 26.0–34.0)
MCHC: 32.1 g/dL (ref 30.0–36.0)
MCV: 93.4 fL (ref 78.0–100.0)
MONOS PCT: 2 %
Monocytes Absolute: 0.2 10*3/uL (ref 0.1–1.0)
NEUTROS ABS: 7.3 10*3/uL (ref 1.7–7.7)
NEUTROS PCT: 86 %
PLATELETS: 266 10*3/uL (ref 150–400)
RBC: 3.34 MIL/uL — ABNORMAL LOW (ref 3.87–5.11)
RDW: 17.1 % — AB (ref 11.5–15.5)
WBC: 8.6 10*3/uL (ref 4.0–10.5)

## 2017-11-21 MED ORDER — BORTEZOMIB CHEMO SQ INJECTION 3.5 MG (2.5MG/ML)
1.3000 mg/m2 | Freq: Once | INTRAMUSCULAR | Status: AC
  Administered 2017-11-21: 2.5 mg via SUBCUTANEOUS
  Filled 2017-11-21: qty 1

## 2017-11-21 MED ORDER — SODIUM CHLORIDE 0.9 % IV SOLN
INTRAVENOUS | Status: DC
  Administered 2017-11-21: 15:00:00 via INTRAVENOUS

## 2017-11-21 MED ORDER — ZOLEDRONIC ACID 4 MG/100ML IV SOLN
4.0000 mg | Freq: Once | INTRAVENOUS | Status: AC
  Administered 2017-11-21: 4 mg via INTRAVENOUS
  Filled 2017-11-21: qty 100

## 2017-11-21 MED ORDER — PROCHLORPERAZINE MALEATE 10 MG PO TABS: 10.0000 mg | ORAL_TABLET | Freq: Once | ORAL | Status: DC

## 2017-11-21 NOTE — Patient Instructions (Signed)
[facility] at [facility] Discharge Instructions     Thank you for choosing [facility] at [facility] to provide your oncology and hematology care.  To afford each patient quality time with our provider, please arrive at least 15 minutes before your scheduled appointment time.   If you have a lab appointment with the Cancer Center please come in thru the  Main Entrance and check in at the main information desk  You need to re-schedule your appointment should you arrive 10 or more minutes late.  We strive to give you quality time with our providers, and arriving late affects you and other patients whose appointments are after yours.  Also, if you no show three or more times for appointments you may be dismissed from the clinic at the providers discretion.     Again, thank you for choosing [facility].  Our hope is that these requests will decrease the amount of time that you wait before being seen by our physicians.       _____________________________________________________________  Should you have questions after your visit to  Cancer Center, please contact our office at [phone] between the hours of 8:00 a.m. and 4:30 p.m.  Voicemails left after 4:00 p.m. will not be returned until the following business day.  For prescription refill requests, have your pharmacy contact our office and allow 72 hours.    Cancer Center Support Programs:   > Cancer Support Group  2nd Tuesday of the month 1pm-2pm, Journey Room    

## 2017-11-21 NOTE — Patient Instructions (Addendum)
[facility] Discharge Instructions for Patients Receiving Chemotherapy   Beginning January 23rd 2017 lab work for the [facility] will be done in the  Main lab at [facility] on 1st floor. If you have a lab appointment with the [employer] please come in thru the  Main Entrance and check in at the main information desk   Today you received the following chemotherapy agents Velcade injection as well as Zometa infusion. Follow-up as scheduled. Call clinic for any questions or concerns  To help prevent nausea and vomiting after your treatment, we encourage you to take your nausea medication   If you develop nausea and vomiting, or diarrhea that is not controlled by your medication, call the clinic.  The clinic phone number is [phone]. Office hours are Monday-Friday 8:30am-5:00pm.  BELOW ARE SYMPTOMS THAT SHOULD BE REPORTED IMMEDIATELY:  *FEVER GREATER THAN 101.0 F  *CHILLS WITH OR WITHOUT FEVER  NAUSEA AND VOMITING THAT IS NOT CONTROLLED WITH YOUR NAUSEA MEDICATION  *UNUSUAL SHORTNESS OF BREATH  *UNUSUAL BRUISING OR BLEEDING  TENDERNESS IN MOUTH AND THROAT WITH OR WITHOUT PRESENCE OF ULCERS  *URINARY PROBLEMS  *BOWEL PROBLEMS  UNUSUAL RASH Items with * indicate a potential emergency and should be followed up as soon as possible. If you have an emergency after office hours please contact your primary care physician or go to the nearest emergency department.  Please call the clinic during office hours if you have any questions or concerns.   You may also contact the Patient Navigator at [phone] should you have any questions or need assistance in obtaining follow up care.      Resources For Cancer Patients and their Caregivers ? American Cancer Society: Can assist with transportation, wigs, general needs, runs Look Good Feel Better.        571-134-7227 ? Cancer Care: Provides financial assistance, online support groups, medication/co-pay  assistance.  1-800-813-HOPE 215-675-2537) ? [facility] Assists [place] cancer patients and their families through emotional , educational and financial support.  365-865-4321 ? [place] DSS Where to apply for food stamps, Medicaid and utility assistance. 406-842-0752 ? [facility]: Transportation to medical appointments. (450) 764-8163 ? Social Security Administration: May apply for disability if have a Stage IV cancer. 920-328-8521 9178189503 ? [employer], Disability and Transit Services: Assists with nutrition, care and transit needs. 7068445962

## 2017-11-21 NOTE — Progress Notes (Signed)
1500 Labs reviewed with and pt seen by Dr. Delton Coombes today and pt approved for Velcade injection and Zometa infusion per MD. Calcium 9.4 today. Pt instructed in purpose and side effects of Zometa with permit signed and written information on this medication given to pt who verbalized understanding. Pt is taking her Revlimid,Decadron and Calcium as prescribed without any issues. VSS Pt discharged via wheelchair in satisfactory condition accompanied by family member

## 2017-11-21 NOTE — Assessment & Plan Note (Signed)
1.  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 - Again presented to the ER on 10/06/2017 with difficulty walking and tingling on the dorsum of both feet.  MRI of the lumbar spine showed multiple lesions in the thoracic and lumbar vertebrae.  Pathological compression fracture of the T10 vertebral body was seen.  There is an epidural soft tissue mass measuring 9 x 15 x 44 mm at T10 vertebral body level. -T9-T10 laminectomy with resection of epidural tumor T10 bilateral transpedicular decompression/partial corpectomy on 10/06/2017, pathology consistent with plasma cell neoplasm. -Skeletal survey on 10/12/2017 shows lytic lesions in the skull and left femur. - SPEP shows 2.1 g of IgA kappa monoclonal protein.  Free light chain ratio was 1.69.  Normal renal function with a normal calcium level.  Beta-2 microglobulin was 1.4.  LDH was normal. -She underwent a bone marrow aspiration biopsy on 10/12/2017.  This showed 9% plasma cells in the aspirate and 20% CD38 positive cells on core biopsy.  Chromosome analysis shows 58, XY.  FISH panel positive for +11, 13q-/-13. - Completed radiation to the spine, 20 Gy in 10 fractions on 11/10/2017. - She was started on Velcade and dexamethasone on 11/14/2017.  Baseline SPEP shows 2.4 g/dL of M spike.  She has tolerated Velcade very well. -She started taking her first Revlimid tablet on 11/21/2017.  We talked about side effects in detail.  Today she will proceed with day 8 of Velcade. - We will give her Velcade treatment off on 12/05/2017 to synchronize her Revlimid and Velcade.  I will start her cycle 2 in the week of 12/11/2017.  2.  Bone strengthening: -We initially wanted to start her on denosumab.  Her insurance company did not approve it.  We will start her on Zometa today.  We  discussed the side effects.

## 2017-11-21 NOTE — Progress Notes (Signed)
Jamie Ewing, [place] [id number]   CLINIC:  Medical Oncology/Hematology  PCP:  Asencion Noble, MD [address] [place] [address] [phone]   REASON FOR VISIT:  Follow-up for multiple myeloma  CURRENT THERAPY: Valcade, Revlimid, and dexamethasone  BRIEF ONCOLOGIC HISTORY:    Multiple myeloma ([facility])   10/10/2017 Initial Diagnosis    Multiple myeloma ([facility])    11/08/2017 -  Chemotherapy    The patient had bortezomib SQ (VELCADE) chemo injection 2.5 mg, 1.3 mg/m2 = 2.5 mg, Subcutaneous,  Once, 1 of 4 cycles Administration: 2.5 mg (11/14/2017)  for chemotherapy treatment.      INTERVAL HISTORY:  Jamie Ewing 77 y.o. female returns for routine follow-up for multiple myeloma. Patient is here today with her God daughter. Patient is doing well since her last visit. She states she actual feels better. Patient started her Revlimid this morning and took her dose of a dexamethasone. Overall she is feeling good and all her blood counts are great today. She is ready for her treatment today. Patient reports her appetite and energy level is at 75%. She is maintaining her weight well. Patient does have some numbness in her feet however this is stable at this time. Patient denies any new pains. Denies any nausea,vomiting, or diarrhea. Denies any headaches or vision changes at this time.     REVIEW OF SYSTEMS:  Review of Systems  Neurological: Positive for numbness.  All other systems reviewed and are negative.    PAST MEDICAL/SURGICAL HISTORY:  Past Medical History:  Diagnosis Date  . Back pain   . Hypercholesteremia   . Hypertension    Past Surgical History:  Procedure Laterality Date  . BONE MARROW BIOPSY    . LAMINECTOMY N/A 10/06/2017   Procedure: THORACIC NINE AND TEN LAMINECTOMY WITH RESECTION OF TUMOR, THORACIC EIGHT TO THORACIC ELEVEN FUSION WITH PEDICLE SCREW FIXATION;  Surgeon: Earnie Larsson, MD;  Location: [facility];  Service:  Neurosurgery;  Laterality: N/A;  . TOTAL KNEE ARTHROPLASTY  2001     SOCIAL HISTORY:  Social History   Socioeconomic History  . Marital status: Widowed    Spouse name: Not on file  . Number of children: 2  . Years of education: Not on file  . Highest education level: Not on file  Occupational History  . Not on file  Social Needs  . Financial resource strain: Not very hard  . Food insecurity:    Worry: Never true    Inability: Never true  . Transportation needs:    Medical: No    Non-medical: No  Tobacco Use  . Smoking status: Former Smoker    Packs/day: 0.50    Years: 30.00    Pack years: 15.00    Types: Cigarettes    Last attempt to quit: 05/31/2006    Years since quitting: 11.4  . Smokeless tobacco: Never Used  Substance and Sexual Activity  . Alcohol use: Yes    Comment: wine occassionally  . Drug use: No  . Sexual activity: Not on file  Lifestyle  . Physical activity:    Days per week: 4 days    Minutes per session: 120 min  . Stress: Not at all  Relationships  . Social connections:    Talks on phone: More than three times a week    Gets together: Once a week    Attends religious service: More than 4 times per year    Active member of club  or organization: Yes    Attends meetings of clubs or organizations: More than 4 times per year    Relationship status: Widowed  . Intimate partner violence:    Fear of current or ex partner: No    Emotionally abused: No    Physically abused: No    Forced sexual activity: No  Other Topics Concern  . Not on file  Social History Narrative  . Not on file    FAMILY HISTORY:  Family History  Problem Relation Age of Onset  . Tuberculosis Mother   . Kidney failure Father   . Hypertension Paternal Aunt   . Diabetes Paternal Aunt   . Hypertension Paternal Uncle   . Diabetes Paternal Uncle   . Hypertension Daughter   . Stroke Daughter     CURRENT MEDICATIONS:  Outpatient Encounter Medications as of 11/21/2017    Medication Sig  . acetaminophen (TYLENOL) 650 MG CR tablet Take 1,300 mg by mouth every 8 (eight) hours as needed for pain.  [name] acyclovir (ZOVIRAX) 400 MG tablet Take 1 tablet (400 mg total) by mouth 2 (two) times daily.  [name] amLODipine (NORVASC) 5 MG tablet Take 5 mg by mouth daily.   . Bortezomib (VELCADE IJ) Inject as directed.  . cyclobenzaprine (FLEXERIL) 5 MG tablet cyclobenzaprine 5 mg tablet  TAKE 1 TO 2 TABLETS BY MOUTH EVERY 6 HOURS AS NEEDED FOR MUSCLE SPASM  . dexamethasone (DECADRON) 4 MG tablet Take 10 tablets (40 mg) on days 1, 8, and 15 of chemo. Repeat every 21 days.  . diazepam (VALIUM) 5 MG tablet Take 1-2 tablets (5-10 mg total) by mouth every 6 (six) hours as needed for muscle spasms.  . hydrochlorothiazide (HYDRODIURIL) 25 MG tablet Take 25 mg by mouth daily.  [name] HYDROcodone-acetaminophen (NORCO/VICODIN) 5-325 MG tablet hydrocodone 5 mg-acetaminophen 325 mg tablet  . lenalidomide (REVLIMID) 25 MG capsule Take one capsule daily on days 1-14 every 21 days.  . naproxen (NAPROSYN) 500 MG tablet naproxen 500 mg tablet  TAKE 1 TABLET BY MOUTH TWICE A DAY  . ondansetron (ZOFRAN) 4 MG tablet Take 1 tablet (4 mg total) by mouth every 8 (eight) hours as needed for nausea or vomiting.  . prochlorperazine (COMPAZINE) 10 MG tablet Take 1 tablet (10 mg total) by mouth every 6 (six) hours as needed (Nausea or vomiting).   No facility-administered encounter medications on file as of 11/21/2017.     ALLERGIES:  No Known Allergies   PHYSICAL EXAM:  ECOG Performance status: 1  Vitals:   11/21/17 1429  BP: 139/61  Pulse: (!) 104  Resp: 18  Temp: 98.2 F (36.8 C)  SpO2: 100%   Filed Weights   11/21/17 1429  Weight: 165 lb 12.8 oz (75.2 kg)    Physical Exam  Constitutional: She is oriented to person, place, and time. She appears well-developed and well-nourished.  Cardiovascular: Normal rate, regular rhythm and normal heart sounds.  Pulmonary/Chest: Effort normal and breath  sounds normal.  Neurological: She is alert and oriented to person, place, and time.  Skin: Skin is warm and dry.  Psychiatric: She has a normal mood and affect. Her behavior is normal. Judgment and thought content normal.     LABORATORY DATA:  I have reviewed the labs as listed.  CBC    Component Value Date/Time   WBC 8.6 11/21/2017 1257   RBC 3.34 (L) 11/21/2017 1257   HGB 10.0 (L) 11/21/2017 1257   HCT 31.2 (L) 11/21/2017 1257   PLT 266  11/21/2017 1257   MCV 93.4 11/21/2017 1257   MCH 29.9 11/21/2017 1257   MCHC 32.1 11/21/2017 1257   RDW 17.1 (H) 11/21/2017 1257   LYMPHSABS 1.1 11/21/2017 1257   MONOABS 0.2 11/21/2017 1257   EOSABS 0.0 11/21/2017 1257   BASOSABS 0.0 11/21/2017 1257   CMP Latest Ref Rng & Units 11/21/2017 11/14/2017 10/07/2017  Glucose 70 - 99 mg/dL 228(H) 190(H) 207(H)  BUN 8 - 23 mg/dL _0 Creatinine 0.44 - 1.00 mg/dL 0.68 0.71 0.88  Sodium 135 - 145 mmol/L 134(L) 134(L) 129(L)  Potassium 3.5 - 5.1 mmol/L 3.6 3.2(L) 3.9  Chloride 98 - 111 mmol/L 98 96(L) 93(L)  CO2 22 - 32 mmol/L 24 24 20(L)  Calcium 8.9 - 10.3 mg/dL 9.4 9.2 9.6  Total Protein 6.5 - 8.1 g/dL 9.0(H) 9.1(H) -  Total Bilirubin 0.3 - 1.2 mg/dL 0.5 0.4 -  Alkaline Phos 38 - 126 U/L 123 116 -  AST 15 - 41 U/L 18 27 -  ALT 0 - 44 U/L 16 24 -      ASSESSMENT & PLAN:   Multiple myeloma ([facility]) 1.  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 - Again presented to the ER on 10/06/2017 with difficulty walking and tingling on the dorsum of both feet.  MRI of the lumbar spine showed multiple lesions in the thoracic and lumbar vertebrae.  Pathological compression fracture of the T10 vertebral body was seen.  There is an epidural soft tissue mass measuring 9 x 15 x 44 mm at T10 vertebral body level. -T9-T10  laminectomy with resection of epidural tumor T10 bilateral transpedicular decompression/partial corpectomy on 10/06/2017, pathology consistent with plasma cell neoplasm. -Skeletal survey on 10/12/2017 shows lytic lesions in the skull and left femur. - SPEP shows 2.1 g of IgA kappa monoclonal protein.  Free light chain ratio was 1.69.  Normal renal function with a normal calcium level.  Beta-2 microglobulin was 1.4.  LDH was normal. -She underwent a bone marrow aspiration biopsy on 10/12/2017.  This showed 9% plasma cells in the aspirate and 20% CD38 positive cells on core biopsy.  Chromosome analysis shows 65, XY.  FISH panel positive for +11, 13q-/-13. - Completed radiation to the spine, 20 Gy in 10 fractions on 11/10/2017. - She was started on Velcade and dexamethasone on 11/14/2017.  Baseline SPEP shows 2.4 g/dL of M spike.  She has tolerated Velcade very well. -She started taking her first Revlimid tablet on 11/21/2017.  We talked about side effects in detail.  Today she will proceed with day 8 of Velcade. - We will give her Velcade treatment off on 12/05/2017 to synchronize her Revlimid and Velcade.  I will start her cycle 2 in the week of 12/11/2017.  2.  Bone strengthening: -We initially wanted to start her on denosumab.  Her insurance company did not approve it.  We will start her on Zometa today.  We discussed the side effects.      Orders placed this encounter:  Orders Placed This Encounter  Procedures  . CBC with Differential/Platelet  . Comprehensive metabolic panel  . Protein electrophoresis, serum  . Kappa/lambda light chains  . Lactate dehydrogenase      Derek Jack, MD [place] [phone]

## 2017-11-22 DIAGNOSIS — C9000 Multiple myeloma not having achieved remission: Secondary | ICD-10-CM | POA: Diagnosis not present

## 2017-11-22 DIAGNOSIS — M519 Unspecified thoracic, thoracolumbar and lumbosacral intervertebral disc disorder: Secondary | ICD-10-CM | POA: Diagnosis not present

## 2017-11-22 DIAGNOSIS — M4804 Spinal stenosis, thoracic region: Secondary | ICD-10-CM | POA: Diagnosis not present

## 2017-11-22 DIAGNOSIS — I10 Essential (primary) hypertension: Secondary | ICD-10-CM | POA: Diagnosis not present

## 2017-11-22 DIAGNOSIS — M8458XD Pathological fracture in neoplastic disease, other specified site, subsequent encounter for fracture with routine healing: Secondary | ICD-10-CM | POA: Diagnosis not present

## 2017-11-22 DIAGNOSIS — C720 Malignant neoplasm of spinal cord: Secondary | ICD-10-CM | POA: Diagnosis not present

## 2017-11-23 DIAGNOSIS — M4804 Spinal stenosis, thoracic region: Secondary | ICD-10-CM | POA: Diagnosis not present

## 2017-11-23 DIAGNOSIS — M8458XD Pathological fracture in neoplastic disease, other specified site, subsequent encounter for fracture with routine healing: Secondary | ICD-10-CM | POA: Diagnosis not present

## 2017-11-23 DIAGNOSIS — C9000 Multiple myeloma not having achieved remission: Secondary | ICD-10-CM | POA: Diagnosis not present

## 2017-11-23 DIAGNOSIS — I10 Essential (primary) hypertension: Secondary | ICD-10-CM | POA: Diagnosis not present

## 2017-11-23 DIAGNOSIS — M519 Unspecified thoracic, thoracolumbar and lumbosacral intervertebral disc disorder: Secondary | ICD-10-CM | POA: Diagnosis not present

## 2017-11-23 DIAGNOSIS — C720 Malignant neoplasm of spinal cord: Secondary | ICD-10-CM | POA: Diagnosis not present

## 2017-11-25 DIAGNOSIS — C720 Malignant neoplasm of spinal cord: Secondary | ICD-10-CM | POA: Diagnosis not present

## 2017-11-25 DIAGNOSIS — C9000 Multiple myeloma not having achieved remission: Secondary | ICD-10-CM | POA: Diagnosis not present

## 2017-11-25 DIAGNOSIS — M8458XD Pathological fracture in neoplastic disease, other specified site, subsequent encounter for fracture with routine healing: Secondary | ICD-10-CM | POA: Diagnosis not present

## 2017-11-25 DIAGNOSIS — I10 Essential (primary) hypertension: Secondary | ICD-10-CM | POA: Diagnosis not present

## 2017-11-25 DIAGNOSIS — M4804 Spinal stenosis, thoracic region: Secondary | ICD-10-CM | POA: Diagnosis not present

## 2017-11-25 DIAGNOSIS — M519 Unspecified thoracic, thoracolumbar and lumbosacral intervertebral disc disorder: Secondary | ICD-10-CM | POA: Diagnosis not present

## 2017-11-26 ENCOUNTER — Other Ambulatory Visit (HOSPITAL_COMMUNITY): Payer: Self-pay | Admitting: Hematology

## 2017-11-26 DIAGNOSIS — C9000 Multiple myeloma not having achieved remission: Secondary | ICD-10-CM

## 2017-11-27 DIAGNOSIS — M519 Unspecified thoracic, thoracolumbar and lumbosacral intervertebral disc disorder: Secondary | ICD-10-CM | POA: Diagnosis not present

## 2017-11-27 DIAGNOSIS — I10 Essential (primary) hypertension: Secondary | ICD-10-CM | POA: Diagnosis not present

## 2017-11-27 DIAGNOSIS — C720 Malignant neoplasm of spinal cord: Secondary | ICD-10-CM | POA: Diagnosis not present

## 2017-11-27 DIAGNOSIS — C9000 Multiple myeloma not having achieved remission: Secondary | ICD-10-CM | POA: Diagnosis not present

## 2017-11-27 DIAGNOSIS — M4804 Spinal stenosis, thoracic region: Secondary | ICD-10-CM | POA: Diagnosis not present

## 2017-11-27 DIAGNOSIS — M8458XD Pathological fracture in neoplastic disease, other specified site, subsequent encounter for fracture with routine healing: Secondary | ICD-10-CM | POA: Diagnosis not present

## 2017-11-28 ENCOUNTER — Inpatient Hospital Stay (HOSPITAL_COMMUNITY): Payer: Medicare HMO

## 2017-11-28 ENCOUNTER — Encounter (HOSPITAL_COMMUNITY): Payer: Self-pay

## 2017-11-28 VITALS — BP 135/51 | HR 100 | Temp 97.4°F | Resp 18

## 2017-11-28 DIAGNOSIS — C9000 Multiple myeloma not having achieved remission: Secondary | ICD-10-CM | POA: Diagnosis not present

## 2017-11-28 DIAGNOSIS — C720 Malignant neoplasm of spinal cord: Secondary | ICD-10-CM | POA: Diagnosis not present

## 2017-11-28 DIAGNOSIS — Z23 Encounter for immunization: Secondary | ICD-10-CM | POA: Diagnosis not present

## 2017-11-28 DIAGNOSIS — M519 Unspecified thoracic, thoracolumbar and lumbosacral intervertebral disc disorder: Secondary | ICD-10-CM | POA: Diagnosis not present

## 2017-11-28 DIAGNOSIS — Z5112 Encounter for antineoplastic immunotherapy: Secondary | ICD-10-CM | POA: Diagnosis not present

## 2017-11-28 DIAGNOSIS — Z87891 Personal history of nicotine dependence: Secondary | ICD-10-CM | POA: Diagnosis not present

## 2017-11-28 DIAGNOSIS — R200 Anesthesia of skin: Secondary | ICD-10-CM | POA: Diagnosis not present

## 2017-11-28 DIAGNOSIS — E876 Hypokalemia: Secondary | ICD-10-CM | POA: Diagnosis not present

## 2017-11-28 DIAGNOSIS — I10 Essential (primary) hypertension: Secondary | ICD-10-CM | POA: Diagnosis not present

## 2017-11-28 DIAGNOSIS — M4804 Spinal stenosis, thoracic region: Secondary | ICD-10-CM | POA: Diagnosis not present

## 2017-11-28 DIAGNOSIS — M8458XD Pathological fracture in neoplastic disease, other specified site, subsequent encounter for fracture with routine healing: Secondary | ICD-10-CM | POA: Diagnosis not present

## 2017-11-28 LAB — CBC WITH DIFFERENTIAL/PLATELET
BASOS ABS: 0 10*3/uL (ref 0.0–0.1)
BASOS PCT: 0 %
EOS ABS: 0 10*3/uL (ref 0.0–0.7)
EOS PCT: 0 %
HEMATOCRIT: 31.6 % — AB (ref 36.0–46.0)
HEMOGLOBIN: 10.3 g/dL — AB (ref 12.0–15.0)
LYMPHS PCT: 10 %
Lymphs Abs: 1.2 10*3/uL (ref 0.7–4.0)
MCH: 30.1 pg (ref 26.0–34.0)
MCHC: 32.6 g/dL (ref 30.0–36.0)
MCV: 92.4 fL (ref 78.0–100.0)
Monocytes Absolute: 0.2 10*3/uL (ref 0.1–1.0)
Monocytes Relative: 2 %
Neutro Abs: 10.2 10*3/uL — ABNORMAL HIGH (ref 1.7–7.7)
Neutrophils Relative %: 88 %
Platelets: 255 10*3/uL (ref 150–400)
RBC: 3.42 MIL/uL — AB (ref 3.87–5.11)
RDW: 17.2 % — ABNORMAL HIGH (ref 11.5–15.5)
WBC: 11.6 10*3/uL — AB (ref 4.0–10.5)

## 2017-11-28 LAB — COMPREHENSIVE METABOLIC PANEL
ALBUMIN: 3.4 g/dL — AB (ref 3.5–5.0)
ALT: 17 U/L (ref 0–44)
ANION GAP: 12 (ref 5–15)
AST: 18 U/L (ref 15–41)
Alkaline Phosphatase: 105 U/L (ref 38–126)
BUN: 11 mg/dL (ref 8–23)
CALCIUM: 9 mg/dL (ref 8.9–10.3)
CO2: 24 mmol/L (ref 22–32)
Chloride: 96 mmol/L — ABNORMAL LOW (ref 98–111)
Creatinine, Ser: 0.75 mg/dL (ref 0.44–1.00)
GFR calc Af Amer: >60 mL/min (ref >60)
GFR calc non Af Amer: >60 mL/min (ref >60)
GLUCOSE: 242 mg/dL — AB (ref 70–99)
Potassium: 3.4 mmol/L — ABNORMAL LOW (ref 3.5–5.1)
SODIUM: 132 mmol/L — AB (ref 135–145)
TOTAL PROTEIN: 8.5 g/dL — AB (ref 6.5–8.1)
Total Bilirubin: 0.5 mg/dL (ref 0.3–1.2)

## 2017-11-28 MED ORDER — PROCHLORPERAZINE MALEATE 10 MG PO TABS: 10.0000 mg | ORAL_TABLET | Freq: Once | ORAL | Status: DC

## 2017-11-28 MED ORDER — BORTEZOMIB CHEMO SQ INJECTION 3.5 MG (2.5MG/ML)
1.3000 mg/m2 | Freq: Once | INTRAMUSCULAR | Status: AC
  Administered 2017-11-28: 2.5 mg via SUBCUTANEOUS
  Filled 2017-11-28: qty 1

## 2017-11-28 NOTE — Patient Instructions (Signed)
[facility] Discharge Instructions for Patients Receiving Chemotherapy  Today you received the following chemotherapy agents velcade.    If you develop nausea and vomiting that is not controlled by your nausea medication, call the clinic.   BELOW ARE SYMPTOMS THAT SHOULD BE REPORTED IMMEDIATELY:  *FEVER GREATER THAN 100.5 F  *CHILLS WITH OR WITHOUT FEVER  NAUSEA AND VOMITING THAT IS NOT CONTROLLED WITH YOUR NAUSEA MEDICATION  *UNUSUAL SHORTNESS OF BREATH  *UNUSUAL BRUISING OR BLEEDING  TENDERNESS IN MOUTH AND THROAT WITH OR WITHOUT PRESENCE OF ULCERS  *URINARY PROBLEMS  *BOWEL PROBLEMS  UNUSUAL RASH Items with * indicate a potential emergency and should be followed up as soon as possible.  Feel free to call the clinic should you have any questions or concerns. The clinic phone number is [phone].  Please show the CHEMO ALERT CARD at check-in to the Emergency Department and triage nurse.   

## 2017-11-28 NOTE — Progress Notes (Signed)
Patient tolerated velcade shot with no complaints voiced.  Injection site clean and dry with no bruising or swelling noted at site.  Band aid applied.  VSS wit discharge and left by wheelchair with family with no s/s of distress noted.

## 2017-11-29 ENCOUNTER — Other Ambulatory Visit (HOSPITAL_COMMUNITY): Payer: Self-pay | Admitting: *Deleted

## 2017-11-29 DIAGNOSIS — I10 Essential (primary) hypertension: Secondary | ICD-10-CM | POA: Diagnosis not present

## 2017-11-29 DIAGNOSIS — C9000 Multiple myeloma not having achieved remission: Secondary | ICD-10-CM

## 2017-11-29 DIAGNOSIS — M519 Unspecified thoracic, thoracolumbar and lumbosacral intervertebral disc disorder: Secondary | ICD-10-CM | POA: Diagnosis not present

## 2017-11-29 DIAGNOSIS — M4804 Spinal stenosis, thoracic region: Secondary | ICD-10-CM | POA: Diagnosis not present

## 2017-11-29 DIAGNOSIS — M8458XD Pathological fracture in neoplastic disease, other specified site, subsequent encounter for fracture with routine healing: Secondary | ICD-10-CM | POA: Diagnosis not present

## 2017-11-29 DIAGNOSIS — C720 Malignant neoplasm of spinal cord: Secondary | ICD-10-CM | POA: Diagnosis not present

## 2017-11-29 MED ORDER — LENALIDOMIDE 25 MG PO CAPS: ORAL_CAPSULE | ORAL | 3 refills | Status: DC

## 2017-11-30 DIAGNOSIS — M519 Unspecified thoracic, thoracolumbar and lumbosacral intervertebral disc disorder: Secondary | ICD-10-CM | POA: Diagnosis not present

## 2017-11-30 DIAGNOSIS — I10 Essential (primary) hypertension: Secondary | ICD-10-CM | POA: Diagnosis not present

## 2017-11-30 DIAGNOSIS — C9000 Multiple myeloma not having achieved remission: Secondary | ICD-10-CM | POA: Diagnosis not present

## 2017-11-30 DIAGNOSIS — C720 Malignant neoplasm of spinal cord: Secondary | ICD-10-CM | POA: Diagnosis not present

## 2017-11-30 DIAGNOSIS — M8458XD Pathological fracture in neoplastic disease, other specified site, subsequent encounter for fracture with routine healing: Secondary | ICD-10-CM | POA: Diagnosis not present

## 2017-11-30 DIAGNOSIS — M4804 Spinal stenosis, thoracic region: Secondary | ICD-10-CM | POA: Diagnosis not present

## 2017-12-01 DIAGNOSIS — C9000 Multiple myeloma not having achieved remission: Secondary | ICD-10-CM | POA: Diagnosis not present

## 2017-12-01 DIAGNOSIS — S22079A Unspecified fracture of T9-T10 vertebra, initial encounter for closed fracture: Secondary | ICD-10-CM | POA: Diagnosis not present

## 2017-12-01 DIAGNOSIS — S22070D Wedge compression fracture of T9-T10 vertebra, subsequent encounter for fracture with routine healing: Secondary | ICD-10-CM | POA: Diagnosis not present

## 2017-12-01 DIAGNOSIS — E119 Type 2 diabetes mellitus without complications: Secondary | ICD-10-CM | POA: Diagnosis not present

## 2017-12-04 DIAGNOSIS — C720 Malignant neoplasm of spinal cord: Secondary | ICD-10-CM | POA: Diagnosis not present

## 2017-12-04 DIAGNOSIS — I10 Essential (primary) hypertension: Secondary | ICD-10-CM | POA: Diagnosis not present

## 2017-12-04 DIAGNOSIS — M4804 Spinal stenosis, thoracic region: Secondary | ICD-10-CM | POA: Diagnosis not present

## 2017-12-04 DIAGNOSIS — M519 Unspecified thoracic, thoracolumbar and lumbosacral intervertebral disc disorder: Secondary | ICD-10-CM | POA: Diagnosis not present

## 2017-12-04 DIAGNOSIS — M8458XD Pathological fracture in neoplastic disease, other specified site, subsequent encounter for fracture with routine healing: Secondary | ICD-10-CM | POA: Diagnosis not present

## 2017-12-04 DIAGNOSIS — C9000 Multiple myeloma not having achieved remission: Secondary | ICD-10-CM | POA: Diagnosis not present

## 2017-12-05 ENCOUNTER — Other Ambulatory Visit (HOSPITAL_COMMUNITY): Payer: Medicare HMO

## 2017-12-05 ENCOUNTER — Ambulatory Visit (HOSPITAL_COMMUNITY): Payer: Medicare HMO

## 2017-12-05 DIAGNOSIS — C9000 Multiple myeloma not having achieved remission: Secondary | ICD-10-CM | POA: Diagnosis not present

## 2017-12-05 DIAGNOSIS — M8458XD Pathological fracture in neoplastic disease, other specified site, subsequent encounter for fracture with routine healing: Secondary | ICD-10-CM | POA: Diagnosis not present

## 2017-12-05 DIAGNOSIS — M519 Unspecified thoracic, thoracolumbar and lumbosacral intervertebral disc disorder: Secondary | ICD-10-CM | POA: Diagnosis not present

## 2017-12-05 DIAGNOSIS — I10 Essential (primary) hypertension: Secondary | ICD-10-CM | POA: Diagnosis not present

## 2017-12-05 DIAGNOSIS — M4804 Spinal stenosis, thoracic region: Secondary | ICD-10-CM | POA: Diagnosis not present

## 2017-12-05 DIAGNOSIS — C720 Malignant neoplasm of spinal cord: Secondary | ICD-10-CM | POA: Diagnosis not present

## 2017-12-07 DIAGNOSIS — M8458XD Pathological fracture in neoplastic disease, other specified site, subsequent encounter for fracture with routine healing: Secondary | ICD-10-CM | POA: Diagnosis not present

## 2017-12-07 DIAGNOSIS — M4804 Spinal stenosis, thoracic region: Secondary | ICD-10-CM | POA: Diagnosis not present

## 2017-12-07 DIAGNOSIS — I10 Essential (primary) hypertension: Secondary | ICD-10-CM | POA: Diagnosis not present

## 2017-12-07 DIAGNOSIS — M519 Unspecified thoracic, thoracolumbar and lumbosacral intervertebral disc disorder: Secondary | ICD-10-CM | POA: Diagnosis not present

## 2017-12-07 DIAGNOSIS — C720 Malignant neoplasm of spinal cord: Secondary | ICD-10-CM | POA: Diagnosis not present

## 2017-12-07 DIAGNOSIS — C9000 Multiple myeloma not having achieved remission: Secondary | ICD-10-CM | POA: Diagnosis not present

## 2017-12-08 DIAGNOSIS — C720 Malignant neoplasm of spinal cord: Secondary | ICD-10-CM | POA: Diagnosis not present

## 2017-12-08 DIAGNOSIS — M4804 Spinal stenosis, thoracic region: Secondary | ICD-10-CM | POA: Diagnosis not present

## 2017-12-08 DIAGNOSIS — M519 Unspecified thoracic, thoracolumbar and lumbosacral intervertebral disc disorder: Secondary | ICD-10-CM | POA: Diagnosis not present

## 2017-12-08 DIAGNOSIS — M8458XD Pathological fracture in neoplastic disease, other specified site, subsequent encounter for fracture with routine healing: Secondary | ICD-10-CM | POA: Diagnosis not present

## 2017-12-08 DIAGNOSIS — C9000 Multiple myeloma not having achieved remission: Secondary | ICD-10-CM | POA: Diagnosis not present

## 2017-12-08 DIAGNOSIS — I10 Essential (primary) hypertension: Secondary | ICD-10-CM | POA: Diagnosis not present

## 2017-12-12 ENCOUNTER — Other Ambulatory Visit (HOSPITAL_COMMUNITY): Payer: Medicare HMO

## 2017-12-12 ENCOUNTER — Ambulatory Visit (HOSPITAL_COMMUNITY): Payer: Medicare HMO

## 2017-12-14 ENCOUNTER — Inpatient Hospital Stay (HOSPITAL_BASED_OUTPATIENT_CLINIC_OR_DEPARTMENT_OTHER): Payer: Medicare HMO | Admitting: Hematology

## 2017-12-14 ENCOUNTER — Inpatient Hospital Stay (HOSPITAL_COMMUNITY): Payer: Medicare HMO

## 2017-12-14 ENCOUNTER — Encounter (HOSPITAL_COMMUNITY): Payer: Self-pay | Admitting: Hematology

## 2017-12-14 ENCOUNTER — Other Ambulatory Visit: Payer: Self-pay

## 2017-12-14 ENCOUNTER — Encounter (HOSPITAL_COMMUNITY): Payer: Self-pay

## 2017-12-14 VITALS — BP 139/56 | HR 87 | Temp 98.2°F | Resp 18 | Wt 171.6 lb

## 2017-12-14 DIAGNOSIS — E876 Hypokalemia: Secondary | ICD-10-CM | POA: Diagnosis not present

## 2017-12-14 DIAGNOSIS — C9000 Multiple myeloma not having achieved remission: Secondary | ICD-10-CM | POA: Diagnosis not present

## 2017-12-14 DIAGNOSIS — R200 Anesthesia of skin: Secondary | ICD-10-CM | POA: Diagnosis not present

## 2017-12-14 DIAGNOSIS — Z87891 Personal history of nicotine dependence: Secondary | ICD-10-CM | POA: Diagnosis not present

## 2017-12-14 DIAGNOSIS — Z23 Encounter for immunization: Secondary | ICD-10-CM | POA: Diagnosis not present

## 2017-12-14 DIAGNOSIS — Z5112 Encounter for antineoplastic immunotherapy: Secondary | ICD-10-CM | POA: Diagnosis not present

## 2017-12-14 LAB — COMPREHENSIVE METABOLIC PANEL
ALT: 15 U/L (ref 0–44)
AST: 21 U/L (ref 15–41)
Albumin: 3 g/dL — ABNORMAL LOW (ref 3.5–5.0)
Alkaline Phosphatase: 84 U/L (ref 38–126)
Anion gap: 14 (ref 5–15)
BUN: 6 mg/dL — ABNORMAL LOW (ref 8–23)
CALCIUM: 8.9 mg/dL (ref 8.9–10.3)
CO2: 24 mmol/L (ref 22–32)
CREATININE: 0.66 mg/dL (ref 0.44–1.00)
Chloride: 99 mmol/L (ref 98–111)
GFR calc Af Amer: >60 mL/min (ref >60)
Glucose, Bld: 153 mg/dL — ABNORMAL HIGH (ref 70–99)
Potassium: 3 mmol/L — ABNORMAL LOW (ref 3.5–5.1)
SODIUM: 137 mmol/L (ref 135–145)
TOTAL PROTEIN: 7.9 g/dL (ref 6.5–8.1)
Total Bilirubin: 0.3 mg/dL (ref 0.3–1.2)

## 2017-12-14 LAB — CBC WITH DIFFERENTIAL/PLATELET
BASOS ABS: 0 10*3/uL (ref 0.0–0.1)
Basophils Relative: 0 %
Eosinophils Absolute: 0.2 10*3/uL (ref 0.0–0.7)
Eosinophils Relative: 3 %
HCT: 32.2 % — ABNORMAL LOW (ref 36.0–46.0)
HEMOGLOBIN: 10.1 g/dL — AB (ref 12.0–15.0)
LYMPHS PCT: 29 %
Lymphs Abs: 1.6 10*3/uL (ref 0.7–4.0)
MCH: 29.2 pg (ref 26.0–34.0)
MCHC: 31.4 g/dL (ref 30.0–36.0)
MCV: 93.1 fL (ref 78.0–100.0)
Monocytes Absolute: 0.9 10*3/uL (ref 0.1–1.0)
Monocytes Relative: 16 %
NEUTROS PCT: 52 %
Neutro Abs: 3 10*3/uL (ref 1.7–7.7)
Platelets: 428 10*3/uL — ABNORMAL HIGH (ref 150–400)
RBC: 3.46 MIL/uL — AB (ref 3.87–5.11)
RDW: 17.5 % — ABNORMAL HIGH (ref 11.5–15.5)
WBC: 5.6 10*3/uL (ref 4.0–10.5)

## 2017-12-14 MED ORDER — INFLUENZA VAC SPLIT HIGH-DOSE 0.5 ML IM SUSY
0.5000 mL | PREFILLED_SYRINGE | INTRAMUSCULAR | Status: AC
  Administered 2017-12-14: 0.5 mL via INTRAMUSCULAR
  Filled 2017-12-14: qty 0.5

## 2017-12-14 MED ORDER — POTASSIUM CHLORIDE CRYS ER 20 MEQ PO TBCR
40.0000 meq | EXTENDED_RELEASE_TABLET | Freq: Once | ORAL | Status: AC
  Administered 2017-12-14: 40 meq via ORAL
  Filled 2017-12-14: qty 2

## 2017-12-14 MED ORDER — BORTEZOMIB CHEMO SQ INJECTION 3.5 MG (2.5MG/ML)
1.3000 mg/m2 | Freq: Once | INTRAMUSCULAR | Status: AC
  Administered 2017-12-14: 2.5 mg via SUBCUTANEOUS
  Filled 2017-12-14: qty 1

## 2017-12-14 MED ORDER — POTASSIUM CHLORIDE CRYS ER 20 MEQ PO TBCR: 20.0000 meq | EXTENDED_RELEASE_TABLET | Freq: Every day | ORAL | 3 refills | Status: DC

## 2017-12-14 MED ORDER — PROCHLORPERAZINE MALEATE 10 MG PO TABS: 10.0000 mg | ORAL_TABLET | Freq: Once | ORAL | Status: DC

## 2017-12-14 NOTE — Progress Notes (Signed)
Jamie Ewing presents today for injection per the provider's orders.  Velcade administration without incident; see MAR for injection details.  Patient tolerated procedure well and without incident.  No questions or complaints noted at this time.  Discharged ambulatory.  

## 2017-12-14 NOTE — Assessment & Plan Note (Addendum)
1.  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 - Again presented to the ER on 10/06/2017 with difficulty walking and tingling on the dorsum of both feet.  MRI of the lumbar spine showed multiple lesions in the thoracic and lumbar vertebrae.  Pathological compression fracture of the T10 vertebral body was seen.  There is an epidural soft tissue mass measuring 9 x 15 x 44 mm at T10 vertebral body level. -T9-T10 laminectomy with resection of epidural tumor T10 bilateral transpedicular decompression/partial corpectomy on 10/06/2017, pathology consistent with plasma cell neoplasm. -Skeletal survey on 10/12/2017 shows lytic lesions in the skull and left femur. - SPEP shows 2.1 g of IgA kappa monoclonal protein.  Free light chain ratio was 1.69.  Normal renal function with a normal calcium level.  Beta-2 microglobulin was 1.4.  LDH was normal. -She underwent a bone marrow aspiration biopsy on 10/12/2017.  This showed 9% plasma cells in the aspirate and 20% CD38 positive cells on core biopsy.  Chromosome analysis shows 34, XY.  FISH panel positive for +11, 13q-/-13. - Completed radiation to the spine, 20 Gy in 10 fractions on 11/10/2017. - She was started on Velcade and dexamethasone on 11/14/2017.  Baseline SPEP shows 2.4 g/dL of M spike.  She has tolerated Velcade very well. -She started her first cycle of Revlimid on 11/21/2017.  She has tolerated cycle 1 very well. - She will start her cycle 2 today.  She will also start her Revlimid today.  She will continue weekly Velcade.  She will be seen back in 3 weeks for follow-up.  2.  Bone strengthening: -She received zoledronic acid on 11/21/2017 and tolerated very well.  3.  Hypokalemia: -Potassium today is 3.0.  She will be given potassium in the clinic.  We will also start her  on potassium 20 mEq daily.

## 2017-12-14 NOTE — Patient Instructions (Signed)
[place] at [facility] Discharge Instructions  Follow up in 3 weeks with labs. Continue on your normal treatment cycle.   Thank you for choosing [place] at [facility] to provide your oncology and hematology care.  To afford each patient quality time with our provider, please arrive at least 15 minutes before your scheduled appointment time.   If you have a lab appointment with the [facility] please come in thru the  Main Entrance and check in at the main information desk  You need to re-schedule your appointment should you arrive 10 or more minutes late.  We strive to give you quality time with our providers, and arriving late affects you and other patients whose appointments are after yours.  Also, if you no show three or more times for appointments you may be dismissed from the clinic at the providers discretion.     Again, thank you for choosing [facility].  Our hope is that these requests will decrease the amount of time that you wait before being seen by our physicians.       _____________________________________________________________  Should you have questions after your visit to [facility], please contact our office at [phone] between the hours of 8:00 a.m. and 4:30 p.m.  Voicemails left after 4:00 p.m. will not be returned until the following business day.  For prescription refill requests, have your pharmacy contact our office and allow 72 hours.    Cancer Center Support Programs:   > Cancer Support Group  2nd Tuesday of the month 1pm-2pm, Journey Room

## 2017-12-14 NOTE — Progress Notes (Signed)
[place], [place] [address]   CLINIC:  Medical Oncology/Hematology  PCP:  Asencion Noble, MD [address] [place] [address] [phone]   REASON FOR VISIT: Follow-up for multiple myeloma  CURRENT THERAPY: Revlimid, Velcade, dexamethasone  BRIEF ONCOLOGIC HISTORY:    Multiple myeloma ([facility])   10/10/2017 Initial Diagnosis    Multiple myeloma ([facility])    11/08/2017 -  Chemotherapy    The patient had bortezomib SQ (VELCADE) chemo injection 2.5 mg, 1.3 mg/m2 = 2.5 mg, Subcutaneous,  Once, 2 of 4 cycles Administration: 2.5 mg (11/14/2017), 2.5 mg (11/21/2017), 2.5 mg (11/28/2017)  for chemotherapy treatment.       INTERVAL HISTORY:  Ms. Hedstrom 77 y.o. female returns for routine follow-up for multiple myeloma. Patient is here alone today for her treatment. She is tolerating treament well. She does have mild swelling bilateral lower extremity's. She has numbness in her little fingers and this was present before treatment. Her appetite is 100% and she is maintaining her weight well. Her energy level is 75%. She denies any nausea, vomiting, or diarrhea. Denies any skin rashes. Denies any worsening of numbness or tingling. Denies any bleeding.     REVIEW OF SYSTEMS:  Review of Systems  Cardiovascular: Positive for leg swelling.  Skin: Positive for itching.  Neurological: Positive for numbness.  Hematological: Bruises/bleeds easily.  All other systems reviewed and are negative.    PAST MEDICAL/SURGICAL HISTORY:  Past Medical History:  Diagnosis Date  . Back pain   . Hypercholesteremia   . Hypertension    Past Surgical History:  Procedure Laterality Date  . BONE MARROW BIOPSY    . LAMINECTOMY N/A 10/06/2017   Procedure: THORACIC NINE AND TEN LAMINECTOMY WITH RESECTION OF TUMOR, THORACIC EIGHT TO THORACIC ELEVEN FUSION WITH PEDICLE SCREW FIXATION;  Surgeon: Earnie Larsson, MD;  Location: [facility];  Service: Neurosurgery;  Laterality: N/A;    . TOTAL KNEE ARTHROPLASTY  2001     SOCIAL HISTORY:  Social History   Socioeconomic History  . Marital status: Widowed    Spouse name: Not on file  . Number of children: 2  . Years of education: Not on file  . Highest education level: Not on file  Occupational History  . Not on file  Social Needs  . Financial resource strain: Not very hard  . Food insecurity:    Worry: Never true    Inability: Never true  . Transportation needs:    Medical: No    Non-medical: No  Tobacco Use  . Smoking status: Former Smoker    Packs/day: 0.50    Years: 30.00    Pack years: 15.00    Types: Cigarettes    Last attempt to quit: 05/31/2006    Years since quitting: 11.5  . Smokeless tobacco: Never Used  Substance and Sexual Activity  . Alcohol use: Yes    Comment: wine occassionally  . Drug use: No  . Sexual activity: Not on file  Lifestyle  . Physical activity:    Days per week: 4 days    Minutes per session: 120 min  . Stress: Not at all  Relationships  . Social connections:    Talks on phone: More than three times a week    Gets together: Once a week    Attends religious service: More than 4 times per year    Active member of club or organization: Yes    Attends meetings of clubs or organizations: More  than 4 times per year    Relationship status: Widowed  . Intimate partner violence:    Fear of current or ex partner: No    Emotionally abused: No    Physically abused: No    Forced sexual activity: No  Other Topics Concern  . Not on file  Social History Narrative  . Not on file    FAMILY HISTORY:  Family History  Problem Relation Age of Onset  . Tuberculosis Mother   . Kidney failure Father   . Hypertension Paternal Aunt   . Diabetes Paternal Aunt   . Hypertension Paternal Uncle   . Diabetes Paternal Uncle   . Hypertension Daughter   . Stroke Daughter     CURRENT MEDICATIONS:  Outpatient Encounter Medications as of 12/14/2017  Medication Sig  . acetaminophen  (TYLENOL) 650 MG CR tablet Take 1,300 mg by mouth every 8 (eight) hours as needed for pain.  [name] acyclovir (ZOVIRAX) 400 MG tablet Take 1 tablet (400 mg total) by mouth 2 (two) times daily.  [name] amLODipine (NORVASC) 5 MG tablet Take 5 mg by mouth daily.   . Bortezomib (VELCADE IJ) Inject as directed.  . cyclobenzaprine (FLEXERIL) 5 MG tablet cyclobenzaprine 5 mg tablet  TAKE 1 TO 2 TABLETS BY MOUTH EVERY 6 HOURS AS NEEDED FOR MUSCLE SPASM  . dexamethasone (DECADRON) 4 MG tablet Take 10 tablets (40 mg) on days 1, 8, and 15 of chemo. Repeat every 21 days.  . diazepam (VALIUM) 5 MG tablet Take 1-2 tablets (5-10 mg total) by mouth every 6 (six) hours as needed for muscle spasms.  . hydrochlorothiazide (HYDRODIURIL) 25 MG tablet Take 25 mg by mouth daily.  [name] HYDROcodone-acetaminophen (NORCO/VICODIN) 5-325 MG tablet hydrocodone 5 mg-acetaminophen 325 mg tablet  . lenalidomide (REVLIMID) 25 MG capsule Take one capsule daily on days 1-14 every 21 days.  . naproxen (NAPROSYN) 500 MG tablet naproxen 500 mg tablet  TAKE 1 TABLET BY MOUTH TWICE A DAY  . ondansetron (ZOFRAN) 4 MG tablet Take 1 tablet (4 mg total) by mouth every 8 (eight) hours as needed for nausea or vomiting.  . potassium chloride SA (K-DUR,KLOR-CON) 20 MEQ tablet Take 1 tablet (20 mEq total) by mouth daily.  . prochlorperazine (COMPAZINE) 10 MG tablet Take 1 tablet (10 mg total) by mouth every 6 (six) hours as needed (Nausea or vomiting).   No facility-administered encounter medications on file as of 12/14/2017.     ALLERGIES:  No Known Allergies   PHYSICAL EXAM:  ECOG Performance status: 1  Vital signs: BP: 139/56, P: 87, R: 18, T:98.2, SATs: 100% Weight: 171.6  Physical Exam  Constitutional: She is oriented to person, place, and time. She appears well-developed and well-nourished.  Neurological: She is alert and oriented to person, place, and time.  Skin: Skin is warm and dry.  Psychiatric: She has a normal mood and affect. Her  behavior is normal. Judgment and thought content normal.     LABORATORY DATA:  I have reviewed the labs as listed.  CBC    Component Value Date/Time   WBC 5.6 12/14/2017 0834   RBC 3.46 (L) 12/14/2017 0834   HGB 10.1 (L) 12/14/2017 0834   HCT 32.2 (L) 12/14/2017 0834   PLT 428 (H) 12/14/2017 0834   MCV 93.1 12/14/2017 0834   MCH 29.2 12/14/2017 0834   MCHC 31.4 12/14/2017 0834   RDW 17.5 (H) 12/14/2017 0834   LYMPHSABS 1.6 12/14/2017 0834   MONOABS 0.9 12/14/2017 0834   EOSABS  0.2 12/14/2017 0834   BASOSABS 0.0 12/14/2017 0834   CMP Latest Ref Rng & Units 12/14/2017 11/28/2017 11/21/2017  Glucose 70 - 99 mg/dL 153(H) 242(H) 228(H)  BUN 8 - 23 mg/dL 6(L) 11 12  Creatinine 0.44 - 1.00 mg/dL 0.66 0.75 0.68  Sodium 135 - 145 mmol/L 137 132(L) 134(L)  Potassium 3.5 - 5.1 mmol/L 3.0(L) 3.4(L) 3.6  Chloride 98 - 111 mmol/L 99 96(L) 98  CO2 22 - 32 mmol/L _0 Calcium 8.9 - 10.3 mg/dL 8.9 9.0 9.4  Total Protein 6.5 - 8.1 g/dL 7.9 8.5(H) 9.0(H)  Total Bilirubin 0.3 - 1.2 mg/dL 0.3 0.5 0.5  Alkaline Phos 38 - 126 U/L 84 105 123  AST 15 - 41 U/L _1 ALT 0 - 44 U/L _2 ASSESSMENT & PLAN:   Multiple myeloma (HCC) 1.  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 - Again presented to the ER on 10/06/2017 with difficulty walking and tingling on the dorsum of both feet.  MRI of the lumbar spine showed multiple lesions in the thoracic and lumbar vertebrae.  Pathological compression fracture of the T10 vertebral body was seen.  There is an epidural soft tissue mass measuring 9 x 15 x 44 mm at T10 vertebral body level. -T9-T10 laminectomy with resection of epidural tumor T10 bilateral transpedicular decompression/partial corpectomy on 10/06/2017, pathology consistent with  plasma cell neoplasm. -Skeletal survey on 10/12/2017 shows lytic lesions in the skull and left femur. - SPEP shows 2.1 g of IgA kappa monoclonal protein.  Free light chain ratio was 1.69.  Normal renal function with a normal calcium level.  Beta-2 microglobulin was 1.4.  LDH was normal. -She underwent a bone marrow aspiration biopsy on 10/12/2017.  This showed 9% plasma cells in the aspirate and 20% CD38 positive cells on core biopsy.  Chromosome analysis shows 62, XY.  FISH panel positive for +11, 13q-/-13. - Completed radiation to the spine, 20 Gy in 10 fractions on 11/10/2017. - She was started on Velcade and dexamethasone on 11/14/2017.  Baseline SPEP shows 2.4 g/dL of M spike.  She has tolerated Velcade very well. -She started her first cycle of Revlimid on 11/21/2017.  She has tolerated cycle 1 very well. - She will start her cycle 2 today.  She will also start her Revlimid today.  She will continue weekly Velcade.  She will be seen back in 3 weeks for follow-up.  2.  Bone strengthening: -She received zoledronic acid on 11/21/2017 and tolerated very well.  3.  Hypokalemia: -Potassium today is 3.0.  She will be given potassium in the clinic.  We will also start her on potassium 20 mEq daily.      Orders placed this encounter:  Orders Placed This Encounter  Procedures  . Protein electrophoresis, serum  . Kappa/lambda light chains  . CBC with Differential/Platelet  . Comprehensive metabolic panel  . Lactate dehydrogenase      Derek Jack, MD [place] [phone]

## 2017-12-15 ENCOUNTER — Other Ambulatory Visit (HOSPITAL_COMMUNITY): Payer: Self-pay | Admitting: Internal Medicine

## 2017-12-15 DIAGNOSIS — C9000 Multiple myeloma not having achieved remission: Secondary | ICD-10-CM

## 2017-12-19 ENCOUNTER — Other Ambulatory Visit (HOSPITAL_COMMUNITY): Payer: Medicare HMO

## 2017-12-19 ENCOUNTER — Ambulatory Visit (HOSPITAL_COMMUNITY): Payer: Medicare HMO

## 2017-12-20 ENCOUNTER — Ambulatory Visit
Admission: RE | Admit: 2017-12-20 | Discharge: 2017-12-20 | Disposition: A | Discharge status: Home / Self Care | Payer: Medicare HMO | Source: Ambulatory Visit | Attending: Urology | Admitting: Urology

## 2017-12-20 ENCOUNTER — Other Ambulatory Visit (HOSPITAL_COMMUNITY): Payer: Self-pay | Admitting: *Deleted

## 2017-12-20 ENCOUNTER — Other Ambulatory Visit: Payer: Self-pay

## 2017-12-20 ENCOUNTER — Encounter: Payer: Self-pay | Admitting: Urology

## 2017-12-20 VITALS — BP 148/68 | HR 97 | Temp 98.0°F | Resp 18 | Ht 63.0 in | Wt 168.2 lb

## 2017-12-20 DIAGNOSIS — C9000 Multiple myeloma not having achieved remission: Secondary | ICD-10-CM | POA: Insufficient documentation

## 2017-12-20 DIAGNOSIS — Z79899 Other long term (current) drug therapy: Secondary | ICD-10-CM | POA: Diagnosis not present

## 2017-12-20 DIAGNOSIS — C9002 Multiple myeloma in relapse: Secondary | ICD-10-CM

## 2017-12-20 DIAGNOSIS — Z7189 Other specified counseling: Secondary | ICD-10-CM

## 2017-12-20 MED ORDER — LENALIDOMIDE 25 MG PO CAPS: ORAL_CAPSULE | ORAL | 0 refills | Status: DC

## 2017-12-20 NOTE — Progress Notes (Signed)
Radiation Oncology         [phone] ________________________________  Name: Jamie Ewing MRN: [id number]  Date: 12/20/2017  DOB: [birth year]  Post Treatment Note  CC: Asencion Noble, MD  Earnie Larsson, MD  Diagnosis:   77 yo woman with newly diagnosed multiple myeloma s/p dorsal laminectomy and decompression for spinal cord compression at T9-T10   Interval Since Last Radiation:  5.5 weeks, palliative postop XRT 10/30/17 - 11/10/17:   The thoracic spine target at T9-T10 was treated to 20 Gy in 10 fractions of 2 Gy, postoperative.  Narrative:  The patient returns today for routine follow-up.  She tolerated radiation treatment relatively well.  She denied nausea or vomiting, diarrhea, skin issues, cough, and swallowing difficulty throughout treatments. She reported back and right pelvic pain, and tingling to her feet with movement. She denied tingling in her legs, and no bilateral leg weakness.                               On review of systems, the patient states that she is doing very well overall.  She is currently without complaints and denies any significant fatigue.  She has had no skin irritation at the radiation site and denies any dysphagia.  She reports a healthy appetite and is maintaining her weight.  She denies abdominal pain, nausea, vomiting, diarrhea or constipation.  She has recently started systemic therapy under the care and guidance of Dr. Delton Coombes with Revlimid and Velcade which she appears to be tolerating well.  She has recently completed cycle 2 which started on 12/14/2017.  ALLERGIES:  has No Known Allergies.  Meds: Current Outpatient Medications  Medication Sig Dispense Refill  . acetaminophen (TYLENOL) 650 MG CR tablet Take 1,300 mg by mouth every 8 (eight) hours as needed for pain.    [name] acyclovir (ZOVIRAX) 400 MG tablet Take 1 tablet (400 mg total) by mouth 2 (two) times daily. 60 tablet 3  . amLODipine (NORVASC) 5 MG tablet Take 5 mg by mouth daily.     . Bortezomib  (VELCADE IJ) Inject as directed.    [name] dexamethasone (DECADRON) 4 MG tablet Take 10 tablets (40 mg) on days 1, 8, and 15 of chemo. Repeat every 21 days. 60 tablet 3  . hydrochlorothiazide (HYDRODIURIL) 25 MG tablet Take 25 mg by mouth daily.    [name] lenalidomide (REVLIMID) 25 MG capsule Take one capsule daily on days 1-14 every 21 days. 14 capsule 0  . potassium chloride SA (K-DUR,KLOR-CON) 20 MEQ tablet Take 1 tablet (20 mEq total) by mouth daily. 30 tablet 3  . cyclobenzaprine (FLEXERIL) 5 MG tablet cyclobenzaprine 5 mg tablet  TAKE 1 TO 2 TABLETS BY MOUTH EVERY 6 HOURS AS NEEDED FOR MUSCLE SPASM    . diazepam (VALIUM) 5 MG tablet Take 1-2 tablets (5-10 mg total) by mouth every 6 (six) hours as needed for muscle spasms. (Patient not taking: Reported on 12/20/2017) 30 tablet 0  . HYDROcodone-acetaminophen (NORCO/VICODIN) 5-325 MG tablet hydrocodone 5 mg-acetaminophen 325 mg tablet    . naproxen (NAPROSYN) 500 MG tablet naproxen 500 mg tablet  TAKE 1 TABLET BY MOUTH TWICE A DAY    . ondansetron (ZOFRAN) 4 MG tablet Take 1 tablet (4 mg total) by mouth every 8 (eight) hours as needed for nausea or vomiting. (Patient not taking: Reported on 12/20/2017) 10 tablet 0  . prochlorperazine (COMPAZINE) 10 MG tablet Take 1 tablet (10 mg total)  by mouth every 6 (six) hours as needed (Nausea or vomiting). (Patient not taking: Reported on 12/20/2017) 30 tablet 1   No current facility-administered medications for this encounter.     Physical Findings:  height is '5\' 3"'$  (1.6 m) and weight is 168 lb 3.2 oz (76.3 kg). Her oral temperature is 98 F (36.7 C). Her blood pressure is 148/68 (abnormal) and her pulse is 97. Her respiration is 18 and oxygen saturation is 100%.  Pain Assessment Pain Score: 0-No pain/10 In general this is a well appearing African-American female in no acute distress.  She's alert and oriented x4 and appropriate throughout the examination. Cardiopulmonary assessment is negative for acute distress  and she exhibits normal effort.  Mild residual hyperpigmentation in the radiation treatment field without desquamation.  The surgical site is well-healed without signs of infection and she is nontender to palpation over the thoracic spine.  Lab Findings: Lab Results  Component Value Date   WBC 11.8 (H) 12/21/2017   HGB 11.2 (L) 12/21/2017   HCT 34.9 (L) 12/21/2017   MCV 91.8 12/21/2017   PLT 291 12/21/2017     Radiographic Findings: No results found.  Impression/Plan: 28. 77 yo woman with multiple myeloma s/p dorsal laminectomy and decompression for spinal cord compression at T9-T10. She appears to have recovered well from the effects of her recent radiotherapy.  She is currently without complaints.  She appears to be tolerating her systemic therapy with Revlimid and Velcade relatively well.  She will continue in routine follow-up with Dr. Delton Coombes for continued management of her systemic disease.  We discussed that while we are happy to continue to participate in her care if clinically indicated, at this point, we will plan to see her back on an as-needed basis.  She appears to have a good understanding of her overall disease and our recommendations and is in agreement with the stated plan.  She knows to call at anytime with any questions or concerns related to her previous radiotherapy.    Nicholos Johns, PA-C

## 2017-12-20 NOTE — Telephone Encounter (Signed)
Chart reviewed, revlimid refilled. 

## 2017-12-21 ENCOUNTER — Inpatient Hospital Stay (HOSPITAL_COMMUNITY): Payer: Medicare HMO | Attending: Nurse Practitioner

## 2017-12-21 ENCOUNTER — Encounter (HOSPITAL_COMMUNITY): Payer: Self-pay

## 2017-12-21 ENCOUNTER — Other Ambulatory Visit: Payer: Self-pay

## 2017-12-21 ENCOUNTER — Inpatient Hospital Stay (HOSPITAL_COMMUNITY): Payer: Medicare HMO

## 2017-12-21 VITALS — BP 129/55 | HR 98 | Temp 98.8°F | Resp 18 | Wt 167.0 lb

## 2017-12-21 DIAGNOSIS — C9000 Multiple myeloma not having achieved remission: Secondary | ICD-10-CM | POA: Insufficient documentation

## 2017-12-21 DIAGNOSIS — Z5112 Encounter for antineoplastic immunotherapy: Secondary | ICD-10-CM | POA: Diagnosis not present

## 2017-12-21 DIAGNOSIS — Z79899 Other long term (current) drug therapy: Secondary | ICD-10-CM | POA: Insufficient documentation

## 2017-12-21 LAB — COMPREHENSIVE METABOLIC PANEL
ALT: 18 U/L (ref 0–44)
ANION GAP: 11 (ref 5–15)
AST: 20 U/L (ref 15–41)
Albumin: 3.5 g/dL (ref 3.5–5.0)
Alkaline Phosphatase: 84 U/L (ref 38–126)
BUN: 11 mg/dL (ref 8–23)
CO2: 23 mmol/L (ref 22–32)
CREATININE: 0.68 mg/dL (ref 0.44–1.00)
Calcium: 9.1 mg/dL (ref 8.9–10.3)
Chloride: 99 mmol/L (ref 98–111)
Glucose, Bld: 177 mg/dL — ABNORMAL HIGH (ref 70–99)
Potassium: 3.9 mmol/L (ref 3.5–5.1)
Sodium: 133 mmol/L — ABNORMAL LOW (ref 135–145)
Total Bilirubin: 0.6 mg/dL (ref 0.3–1.2)
Total Protein: 8.2 g/dL — ABNORMAL HIGH (ref 6.5–8.1)

## 2017-12-21 LAB — CBC WITH DIFFERENTIAL/PLATELET
Basophils Absolute: 0 10*3/uL (ref 0.0–0.1)
Basophils Relative: 0 %
EOS PCT: 1 %
Eosinophils Absolute: 0.1 10*3/uL (ref 0.0–0.7)
HCT: 34.9 % — ABNORMAL LOW (ref 36.0–46.0)
Hemoglobin: 11.2 g/dL — ABNORMAL LOW (ref 12.0–15.0)
LYMPHS ABS: 1.8 10*3/uL (ref 0.7–4.0)
LYMPHS PCT: 16 %
MCH: 29.5 pg (ref 26.0–34.0)
MCHC: 32.1 g/dL (ref 30.0–36.0)
MCV: 91.8 fL (ref 78.0–100.0)
MONO ABS: 0.4 10*3/uL (ref 0.1–1.0)
MONOS PCT: 3 %
Neutro Abs: 9.5 10*3/uL — ABNORMAL HIGH (ref 1.7–7.7)
Neutrophils Relative %: 80 %
PLATELETS: 291 10*3/uL (ref 150–400)
RBC: 3.8 MIL/uL — AB (ref 3.87–5.11)
RDW: 18 % — AB (ref 11.5–15.5)
WBC: 11.8 10*3/uL — ABNORMAL HIGH (ref 4.0–10.5)

## 2017-12-21 LAB — LACTATE DEHYDROGENASE: LDH: 119 U/L (ref 98–192)

## 2017-12-21 MED ORDER — ZOLEDRONIC ACID 4 MG/100ML IV SOLN
4.0000 mg | Freq: Once | INTRAVENOUS | Status: AC
  Administered 2017-12-21: 4 mg via INTRAVENOUS
  Filled 2017-12-21: qty 100

## 2017-12-21 MED ORDER — SODIUM CHLORIDE 0.9 % IV SOLN
INTRAVENOUS | Status: DC
  Administered 2017-12-21: 13:00:00 via INTRAVENOUS

## 2017-12-21 MED ORDER — PROCHLORPERAZINE MALEATE 10 MG PO TABS: 10.0000 mg | ORAL_TABLET | Freq: Once | ORAL | Status: DC

## 2017-12-21 MED ORDER — BORTEZOMIB CHEMO SQ INJECTION 3.5 MG (2.5MG/ML)
1.3000 mg/m2 | Freq: Once | INTRAMUSCULAR | Status: AC
  Administered 2017-12-21: 2.5 mg via SUBCUTANEOUS
  Filled 2017-12-21: qty 1

## 2017-12-21 NOTE — Progress Notes (Signed)
Jamie Ewing presents today for injection per the provider's orders.  Velcade administration without incident; pt also received Zometa infusion today w/o incident; see MAR for injection details.  Patient tolerated procedure well and without incident.  No questions or complaints noted at this time.  Discharged ambulatory.

## 2017-12-22 LAB — KAPPA/LAMBDA LIGHT CHAINS
KAPPA, LAMDA LIGHT CHAIN RATIO: 1.29 (ref 0.26–1.65)
Kappa free light chain: 24.3 mg/L — ABNORMAL HIGH (ref 3.3–19.4)
LAMDA FREE LIGHT CHAINS: 18.9 mg/L (ref 5.7–26.3)

## 2017-12-22 LAB — PROTEIN ELECTROPHORESIS, SERUM
A/G RATIO SPE: 0.8 (ref 0.7–1.7)
ALPHA-1-GLOBULIN: 0.3 g/dL (ref 0.0–0.4)
ALPHA-2-GLOBULIN: 1.2 g/dL — AB (ref 0.4–1.0)
Albumin ELP: 3.3 g/dL (ref 2.9–4.4)
Beta Globulin: 2.2 g/dL — ABNORMAL HIGH (ref 0.7–1.3)
GLOBULIN, TOTAL: 4.3 g/dL — AB (ref 2.2–3.9)
Gamma Globulin: 0.6 g/dL (ref 0.4–1.8)
M-Spike, %: 1.4 g/dL — ABNORMAL HIGH
Total Protein ELP: 7.6 g/dL (ref 6.0–8.5)

## 2017-12-28 ENCOUNTER — Inpatient Hospital Stay (HOSPITAL_COMMUNITY): Payer: Medicare HMO

## 2017-12-28 VITALS — BP 138/56 | HR 90 | Temp 97.8°F | Resp 16 | Wt 167.0 lb

## 2017-12-28 DIAGNOSIS — C9000 Multiple myeloma not having achieved remission: Secondary | ICD-10-CM

## 2017-12-28 DIAGNOSIS — Z79899 Other long term (current) drug therapy: Secondary | ICD-10-CM | POA: Diagnosis not present

## 2017-12-28 DIAGNOSIS — Z5112 Encounter for antineoplastic immunotherapy: Secondary | ICD-10-CM | POA: Diagnosis not present

## 2017-12-28 LAB — CBC WITH DIFFERENTIAL/PLATELET
ABS IMMATURE GRANULOCYTES: 0.03 10*3/uL (ref 0.00–0.07)
BASOS ABS: 0 10*3/uL (ref 0.0–0.1)
Basophils Relative: 0 %
EOS ABS: 0.3 10*3/uL (ref 0.0–0.5)
Eosinophils Relative: 4 %
HEMATOCRIT: 37.4 % (ref 36.0–46.0)
Hemoglobin: 11.4 g/dL — ABNORMAL LOW (ref 12.0–15.0)
IMMATURE GRANULOCYTES: 0 %
Lymphocytes Relative: 19 %
Lymphs Abs: 1.5 10*3/uL (ref 0.7–4.0)
MCH: 27.8 pg (ref 26.0–34.0)
MCHC: 30.5 g/dL (ref 30.0–36.0)
MCV: 91.2 fL (ref 80.0–100.0)
Monocytes Absolute: 1.7 10*3/uL — ABNORMAL HIGH (ref 0.1–1.0)
Monocytes Relative: 22 %
NEUTROS PCT: 55 %
NRBC: 0 % (ref 0.0–0.2)
Neutro Abs: 4.1 10*3/uL (ref 1.7–7.7)
Platelets: 252 10*3/uL (ref 150–400)
RBC: 4.1 MIL/uL (ref 3.87–5.11)
RDW: 17.9 % — AB (ref 11.5–15.5)
WBC: 7.6 10*3/uL (ref 4.0–10.5)

## 2017-12-28 LAB — COMPREHENSIVE METABOLIC PANEL
ALBUMIN: 3.6 g/dL (ref 3.5–5.0)
ALT: 22 U/L (ref 0–44)
AST: 22 U/L (ref 15–41)
Alkaline Phosphatase: 87 U/L (ref 38–126)
Anion gap: 12 (ref 5–15)
BILIRUBIN TOTAL: 0.5 mg/dL (ref 0.3–1.2)
BUN: 12 mg/dL (ref 8–23)
CHLORIDE: 100 mmol/L (ref 98–111)
CO2: 22 mmol/L (ref 22–32)
CREATININE: 0.7 mg/dL (ref 0.44–1.00)
Calcium: 8.7 mg/dL — ABNORMAL LOW (ref 8.9–10.3)
GFR calc Af Amer: >60 mL/min (ref >60)
GFR calc non Af Amer: >60 mL/min (ref >60)
GLUCOSE: 92 mg/dL (ref 70–99)
POTASSIUM: 3.5 mmol/L (ref 3.5–5.1)
Sodium: 134 mmol/L — ABNORMAL LOW (ref 135–145)
TOTAL PROTEIN: 8.1 g/dL (ref 6.5–8.1)

## 2017-12-28 MED ORDER — PROCHLORPERAZINE MALEATE 10 MG PO TABS: 10.0000 mg | ORAL_TABLET | Freq: Once | ORAL | Status: DC

## 2017-12-28 MED ORDER — BORTEZOMIB CHEMO SQ INJECTION 3.5 MG (2.5MG/ML)
1.3000 mg/m2 | Freq: Once | INTRAMUSCULAR | Status: AC
  Administered 2017-12-28: 2.5 mg via SUBCUTANEOUS
  Filled 2017-12-28: qty 1

## 2017-12-28 NOTE — Progress Notes (Signed)
Eber Hong tolerated Velcade injection without incident or complaint. VSS. Lab work reviewed prior to administration. Discharged self ambulatory using rolling walker in satisfactory condition.

## 2017-12-28 NOTE — Patient Instructions (Signed)
[facility] Discharge Instructions for Patients Receiving Chemotherapy   Beginning January 23rd 2017 lab work for the Cancer Center will be done in the  Main lab at  on 1st floor. If you have a lab appointment with the Cancer Center please come in thru the  Main Entrance and check in at the main information desk   Today you received the following chemotherapy agents Velcade  To help prevent nausea and vomiting after your treatment, we encourage you to take your nausea medication    If you develop nausea and vomiting, or diarrhea that is not controlled by your medication, call the clinic.  The clinic phone number is [phone]. Office hours are Monday-Friday 8:30am-5:00pm.  BELOW ARE SYMPTOMS THAT SHOULD BE REPORTED IMMEDIATELY:  *FEVER GREATER THAN 101.0 F  *CHILLS WITH OR WITHOUT FEVER  NAUSEA AND VOMITING THAT IS NOT CONTROLLED WITH YOUR NAUSEA MEDICATION  *UNUSUAL SHORTNESS OF BREATH  *UNUSUAL BRUISING OR BLEEDING  TENDERNESS IN MOUTH AND THROAT WITH OR WITHOUT PRESENCE OF ULCERS  *URINARY PROBLEMS  *BOWEL PROBLEMS  UNUSUAL RASH Items with * indicate a potential emergency and should be followed up as soon as possible. If you have an emergency after office hours please contact your primary care physician or go to the nearest emergency department.  Please call the clinic during office hours if you have any questions or concerns.   You may also contact the Patient Navigator at [phone] should you have any questions or need assistance in obtaining follow up care.      Resources For Cancer Patients and their Caregivers ? American Cancer Society: Can assist with transportation, wigs, general needs, runs Look Good Feel Better.        1-888-227-6333 ? Cancer Care: Provides financial assistance, online support groups, medication/co-pay assistance.  1-800-813-HOPE (4673) ? [facility] Assists [place] cancer  patients and their families through emotional , educational and financial support.  336-427-4357 ? [place] DSS Where to apply for food stamps, Medicaid and utility assistance. 336-342-1394 ? [facility]: Transportation to medical appointments. 336-347-2287 ? Social Security Administration: May apply for disability if have a Stage IV cancer. 336-342-7796 1-800-772-1213 ? [place] Aging, Disability and Transit Services: Assists with nutrition, care and transit needs. 336-349-2343          

## 2018-01-04 ENCOUNTER — Inpatient Hospital Stay (HOSPITAL_BASED_OUTPATIENT_CLINIC_OR_DEPARTMENT_OTHER): Payer: Medicare HMO | Admitting: Internal Medicine

## 2018-01-04 ENCOUNTER — Inpatient Hospital Stay (HOSPITAL_COMMUNITY): Payer: Medicare HMO

## 2018-01-04 ENCOUNTER — Encounter (HOSPITAL_COMMUNITY): Payer: Self-pay | Admitting: Internal Medicine

## 2018-01-04 ENCOUNTER — Other Ambulatory Visit: Payer: Self-pay

## 2018-01-04 VITALS — BP 137/60 | HR 87 | Temp 98.2°F | Resp 18 | Wt 168.0 lb

## 2018-01-04 DIAGNOSIS — C9000 Multiple myeloma not having achieved remission: Secondary | ICD-10-CM

## 2018-01-04 DIAGNOSIS — G629 Polyneuropathy, unspecified: Secondary | ICD-10-CM | POA: Diagnosis not present

## 2018-01-04 DIAGNOSIS — E876 Hypokalemia: Secondary | ICD-10-CM

## 2018-01-04 DIAGNOSIS — Z79899 Other long term (current) drug therapy: Secondary | ICD-10-CM | POA: Diagnosis not present

## 2018-01-04 DIAGNOSIS — Z5112 Encounter for antineoplastic immunotherapy: Secondary | ICD-10-CM | POA: Diagnosis not present

## 2018-01-04 LAB — LACTATE DEHYDROGENASE: LDH: 127 U/L (ref 98–192)

## 2018-01-04 LAB — COMPREHENSIVE METABOLIC PANEL
ALK PHOS: 90 U/L (ref 38–126)
ALT: 18 U/L (ref 0–44)
AST: 12 U/L — ABNORMAL LOW (ref 15–41)
Albumin: 3.6 g/dL (ref 3.5–5.0)
Anion gap: 11 (ref 5–15)
BILIRUBIN TOTAL: 0.5 mg/dL (ref 0.3–1.2)
BUN: 13 mg/dL (ref 8–23)
CALCIUM: 9 mg/dL (ref 8.9–10.3)
CO2: 22 mmol/L (ref 22–32)
CREATININE: 0.79 mg/dL (ref 0.44–1.00)
Chloride: 103 mmol/L (ref 98–111)
GFR calc non Af Amer: >60 mL/min (ref >60)
Glucose, Bld: 169 mg/dL — ABNORMAL HIGH (ref 70–99)
Potassium: 4 mmol/L (ref 3.5–5.1)
SODIUM: 136 mmol/L (ref 135–145)
Total Protein: 7.6 g/dL (ref 6.5–8.1)

## 2018-01-04 LAB — CBC WITH DIFFERENTIAL/PLATELET
ABS IMMATURE GRANULOCYTES: 0.02 10*3/uL (ref 0.00–0.07)
Basophils Absolute: 0 10*3/uL (ref 0.0–0.1)
Basophils Relative: 0 %
Eosinophils Absolute: 0.1 10*3/uL (ref 0.0–0.5)
Eosinophils Relative: 1 %
HEMATOCRIT: 38.6 % (ref 36.0–46.0)
HEMOGLOBIN: 11.7 g/dL — AB (ref 12.0–15.0)
Immature Granulocytes: 0 %
LYMPHS PCT: 23 %
Lymphs Abs: 1.6 10*3/uL (ref 0.7–4.0)
MCH: 28.1 pg (ref 26.0–34.0)
MCHC: 30.3 g/dL (ref 30.0–36.0)
MCV: 92.6 fL (ref 80.0–100.0)
MONO ABS: 1 10*3/uL (ref 0.1–1.0)
MONOS PCT: 14 %
NEUTROS ABS: 4.2 10*3/uL (ref 1.7–7.7)
Neutrophils Relative %: 62 %
Platelets: 257 10*3/uL (ref 150–400)
RBC: 4.17 MIL/uL (ref 3.87–5.11)
RDW: 17.7 % — ABNORMAL HIGH (ref 11.5–15.5)
WBC: 6.9 10*3/uL (ref 4.0–10.5)
nRBC: 0 % (ref 0.0–0.2)

## 2018-01-04 MED ORDER — BORTEZOMIB CHEMO SQ INJECTION 3.5 MG (2.5MG/ML)
1.3000 mg/m2 | Freq: Once | INTRAMUSCULAR | Status: AC
  Administered 2018-01-04: 2.5 mg via SUBCUTANEOUS
  Filled 2018-01-04: qty 1

## 2018-01-04 MED ORDER — PROCHLORPERAZINE MALEATE 10 MG PO TABS: 10.0000 mg | ORAL_TABLET | Freq: Once | ORAL | Status: DC

## 2018-01-04 NOTE — Patient Instructions (Signed)
[facility] Discharge Instructions for Patients Receiving Chemotherapy   Beginning January 23rd 2017 lab work for the Cancer Center will be done in the  Main lab at [facility] on 1st floor. If you have a lab appointment with the Cancer Center please come in thru the  Main Entrance and check in at the main information desk   Today you received the following chemotherapy agents Velcade. Follow-up as scheduled. Call clinic for any questions or concerns  To help prevent nausea and vomiting after your treatment, we encourage you to take your nausea medication   If you develop nausea and vomiting, or diarrhea that is not controlled by your medication, call the clinic.  The clinic phone number is [phone]. Office hours are Monday-Friday 8:30am-5:00pm.  BELOW ARE SYMPTOMS THAT SHOULD BE REPORTED IMMEDIATELY:  *FEVER GREATER THAN 101.0 F  *CHILLS WITH OR WITHOUT FEVER  NAUSEA AND VOMITING THAT IS NOT CONTROLLED WITH YOUR NAUSEA MEDICATION  *UNUSUAL SHORTNESS OF BREATH  *UNUSUAL BRUISING OR BLEEDING  TENDERNESS IN MOUTH AND THROAT WITH OR WITHOUT PRESENCE OF ULCERS  *URINARY PROBLEMS  *BOWEL PROBLEMS  UNUSUAL RASH Items with * indicate a potential emergency and should be followed up as soon as possible. If you have an emergency after office hours please contact your primary care physician or go to the nearest emergency department.  Please call the clinic during office hours if you have any questions or concerns.   You may also contact the Patient Navigator at [phone] should you have any questions or need assistance in obtaining follow up care.      Resources For Cancer Patients and their Caregivers ? American Cancer Society: Can assist with transportation, wigs, general needs, runs Look Good Feel Better.        1-888-227-6333 ? Cancer Care: Provides financial assistance, online support groups, medication/co-pay assistance.  1-800-813-HOPE  (4673) ? [facility] Assists [place] cancer patients and their families through emotional , educational and financial support.  336-427-4357 ? [place] DSS Where to apply for food stamps, Medicaid and utility assistance. 336-342-1394 ? [facility]: Transportation to medical appointments. 336-347-2287 ? Social Security Administration: May apply for disability if have a Stage IV cancer. 336-342-7796 1-800-772-1213 ? [place] Aging, Disability and Transit Services: Assists with nutrition, care and transit needs. 336-349-2343         

## 2018-01-04 NOTE — Progress Notes (Signed)
Diagnosis Multiple myeloma not having achieved remission ([place]) - Plan: CBC with Differential/Platelet, Comprehensive metabolic panel, Lactate dehydrogenase, Protein electrophoresis, serum, IgG, IgA, IgM, Kappa/lambda light chains, DISCONTINUED: prochlorperazine (COMPAZINE) tablet 10 mg, DISCONTINUED: bortezomib SQ (VELCADE) chemo injection 2.5 mg  Staging Cancer Staging No matching staging information was found for the patient.  Assessment and Plan:  Multiple myeloma ([place]) 1.  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 -Presented to the emergency room on 09/29/2017 with back pain, normal x-ray of the lumbar spine. - Again presented to the ER on 10/06/2017 with difficulty walking and tingling on the dorsum of both feet.  MRI of the lumbar spine showed multiple lesions in the thoracic and lumbar vertebrae.  Pathological compression fracture of the T10 vertebral body was seen.  There is an epidural soft tissue mass measuring 9 x 15 x 44 mm at T10 vertebral body level. -T9-T10 laminectomy with resection of epidural tumor T10 bilateral transpedicular decompression/partial corpectomy on 10/06/2017, pathology consistent with plasma cell neoplasm. -Skeletal survey on 10/12/2017 shows lytic lesions in the skull and left femur. - SPEP shows 2.1 g of IgA kappa monoclonal protein.  Free light chain ratio was 1.69.  Normal renal function with a normal calcium level.  Beta-2 microglobulin was 1.4.  LDH was normal. -She underwent a bone marrow aspiration biopsy on 10/12/2017.  This showed 9% plasma cells in the aspirate and 20% CD38 positive cells on core biopsy.  Chromosome analysis shows 22, XY.  FISH panel positive for +11, 13q-/-13. - Completed radiation to the spine, 20 Gy in 10 fractions on 11/10/2017. - She was started on Velcade and dexamethasone on 11/14/2017.  Baseline SPEP shows 2.4 g/dL of M spike.  She has  tolerated Velcade very well. -She started her first cycle of Revlimid on 11/21/2017.  She has tolerated cycle 1 very well.  Pt here for evaluation prior to weekly Velcade.   Labs done 01/04/2018 reviewed and showed WBC 6.9 HB 11.7 plts 257,000.  Chemistries WNL wit K+ 4, Cr 0.79 and normal LFTs.  Recent SPEP done 12/21/2017 showed M spike of 1.4 g/dl which was improved from 2.4.  Pt will RTC in 3 weeks for follow-up with Dr. Worthy Keeler and repeat labs.    2.  Bone strengthening:.  Pt will continue zoledronic acid monthly.   3.  Hypokalemia.  Potassium improved at 4.  Continue potassium 20 mEq daily.  4. Neuropathy.  Will monitor this as Velcade proceeds.    25 minutes spent with more than 50% spent in counseling and coordination of care.     Current Status:  Pt is seen today for follow-up.  She is here to go over labs and for evaluation prior to Velcade.      Multiple myeloma ([place])   10/10/2017 Initial Diagnosis    Multiple myeloma ([place])    11/08/2017 -  Chemotherapy    The patient had bortezomib SQ (VELCADE) chemo injection 2.5 mg, 1.3 mg/m2 = 2.5 mg, Subcutaneous,  Once, 3 of 4 cycles Administration: 2.5 mg (11/14/2017), 2.5 mg (11/21/2017), 2.5 mg (11/28/2017), 2.5 mg (12/14/2017), 2.5 mg (12/21/2017), 2.5 mg (12/28/2017)  for chemotherapy treatment.       Problem List Patient Active Problem List   Diagnosis Date Noted  . Goals of care, counseling/discussion [Z71.89] 10/19/2017  . Multiple myeloma ([place]) [C90.00] 10/10/2017  . Status post surgery [Z98.890] 10/06/2017  . Thoracic spine tumor [  D49.2] 10/06/2017  . Spinal stenosis of thoracic region [M48.04] 10/06/2017  . Thoracic stenosis [M48.04] 10/06/2017  . Lipoma of axilla [D17.20] 05/31/2011    Past Medical History Past Medical History:  Diagnosis Date  . Back pain   . Hypercholesteremia   . Hypertension     Past Surgical History Past Surgical History:  Procedure Laterality Date  . BONE MARROW BIOPSY    . LAMINECTOMY  N/A 10/06/2017   Procedure: THORACIC NINE AND TEN LAMINECTOMY WITH RESECTION OF TUMOR, THORACIC EIGHT TO THORACIC ELEVEN FUSION WITH PEDICLE SCREW FIXATION;  Surgeon: Earnie Larsson, MD;  Location: [facility];  Service: Neurosurgery;  Laterality: N/A;  . TOTAL KNEE ARTHROPLASTY  2001    Family History Family History  Problem Relation Age of Onset  . Tuberculosis Mother   . Kidney failure Father   . Hypertension Paternal Aunt   . Diabetes Paternal Aunt   . Hypertension Paternal Uncle   . Diabetes Paternal Uncle   . Hypertension Daughter   . Stroke Daughter      Social History  reports that she quit smoking about 11 years ago. Her smoking use included cigarettes. She has a 15.00 pack-year smoking history. She has never used smokeless tobacco. She reports that she drinks alcohol. She reports that she does not use drugs.  Medications  Current Outpatient Medications:  .  acetaminophen (TYLENOL) 650 MG CR tablet, Take 1,300 mg by mouth every 8 (eight) hours as needed for pain., Disp: , Rfl:  .  acyclovir (ZOVIRAX) 400 MG tablet, Take 1 tablet (400 mg total) by mouth 2 (two) times daily., Disp: 60 tablet, Rfl: 3 .  amLODipine (NORVASC) 5 MG tablet, Take 5 mg by mouth daily. , Disp: , Rfl:  .  Bortezomib (VELCADE IJ), Inject as directed., Disp: , Rfl:  .  cyclobenzaprine (FLEXERIL) 5 MG tablet, cyclobenzaprine 5 mg tablet  TAKE 1 TO 2 TABLETS BY MOUTH EVERY 6 HOURS AS NEEDED FOR MUSCLE SPASM, Disp: , Rfl:  .  dexamethasone (DECADRON) 4 MG tablet, Take 10 tablets (40 mg) on days 1, 8, and 15 of chemo. Repeat every 21 days., Disp: 60 tablet, Rfl: 3 .  diazepam (VALIUM) 5 MG tablet, Take 1-2 tablets (5-10 mg total) by mouth every 6 (six) hours as needed for muscle spasms., Disp: 30 tablet, Rfl: 0 .  hydrochlorothiazide (HYDRODIURIL) 25 MG tablet, Take 25 mg by mouth daily., Disp: , Rfl:  .  HYDROcodone-acetaminophen (NORCO/VICODIN) 5-325 MG tablet, hydrocodone 5 mg-acetaminophen 325 mg tablet, Disp: ,  Rfl:  .  lenalidomide (REVLIMID) 25 MG capsule, Take one capsule daily on days 1-14 every 21 days., Disp: 14 capsule, Rfl: 0 .  naproxen (NAPROSYN) 500 MG tablet, naproxen 500 mg tablet  TAKE 1 TABLET BY MOUTH TWICE A DAY, Disp: , Rfl:  .  ondansetron (ZOFRAN) 4 MG tablet, Take 1 tablet (4 mg total) by mouth every 8 (eight) hours as needed for nausea or vomiting., Disp: 10 tablet, Rfl: 0 .  potassium chloride SA (K-DUR,KLOR-CON) 20 MEQ tablet, Take 1 tablet (20 mEq total) by mouth daily., Disp: 30 tablet, Rfl: 3 .  prochlorperazine (COMPAZINE) 10 MG tablet, Take 1 tablet (10 mg total) by mouth every 6 (six) hours as needed (Nausea or vomiting)., Disp: 30 tablet, Rfl: 1  Allergies Patient has no known allergies.  Review of Systems Review of Systems - Oncology ROS negative other than neuropathy   Physical Exam  Vitals Wt Readings from Last 3 Encounters:  01/04/18 168 lb (  76.2 kg)  12/28/17 167 lb (75.8 kg)  12/21/17 167 lb (75.8 kg)   Temp Readings from Last 3 Encounters:  01/04/18 98.2 F (36.8 C) (Oral)  12/28/17 97.8 F (36.6 C) (Oral)  12/21/17 98.8 F (37.1 C) (Oral)   BP Readings from Last 3 Encounters:  01/04/18 137/60  12/28/17 (!) 138/56  12/21/17 (!) 129/55   Pulse Readings from Last 3 Encounters:  01/04/18 87  12/28/17 90  12/21/17 98   Constitutional: Well-developed, well-nourished, and in no distress.   HENT: Head: Normocephalic and atraumatic.  Mouth/Throat: No oropharyngeal exudate. Mucosa moist. Eyes: Pupils are equal, round, and reactive to light. Conjunctivae are normal. No scleral icterus.  Neck: Normal range of motion. Neck supple. No JVD present.  Cardiovascular: Normal rate, regular rhythm and normal heart sounds.  Exam reveals no gallop and no friction rub.   No murmur heard. Pulmonary/Chest: Effort normal and breath sounds normal. No respiratory distress. No wheezes.No rales.  Abdominal: Soft. Bowel sounds are normal. No distension. There is no  tenderness. There is no guarding.  Musculoskeletal: No edema or tenderness. Brace noted Lymphadenopathy: No cervical, axillary or supraclavicular adenopathy.  Neurological: Alert and oriented to person, place, and time. No cranial nerve deficit.  Skin: Skin is warm and dry. No rash noted. No erythema. No pallor.  Psychiatric: Affect and judgment normal.   Labs Appointment on 01/04/2018  Component Date Value Ref Range Status  . WBC 01/04/2018 6.9  4.0 - 10.5 K/uL Final  . RBC 01/04/2018 4.17  3.87 - 5.11 MIL/uL Final  . Hemoglobin 01/04/2018 11.7* 12.0 - 15.0 g/dL Final  . HCT 01/04/2018 38.6  36.0 - 46.0 % Final  . MCV 01/04/2018 92.6  80.0 - 100.0 fL Final  . MCH 01/04/2018 28.1  26.0 - 34.0 pg Final  . MCHC 01/04/2018 30.3  30.0 - 36.0 g/dL Final  . RDW 01/04/2018 17.7* 11.5 - 15.5 % Final  . Platelets 01/04/2018 257  150 - 400 K/uL Final  . nRBC 01/04/2018 0.0  0.0 - 0.2 % Final  . Neutrophils Relative % 01/04/2018 62  % Final  . Neutro Abs 01/04/2018 4.2  1.7 - 7.7 K/uL Final  . Lymphocytes Relative 01/04/2018 23  % Final  . Lymphs Abs 01/04/2018 1.6  0.7 - 4.0 K/uL Final  . Monocytes Relative 01/04/2018 14  % Final  . Monocytes Absolute 01/04/2018 1.0  0.1 - 1.0 K/uL Final  . Eosinophils Relative 01/04/2018 1  % Final  . Eosinophils Absolute 01/04/2018 0.1  0.0 - 0.5 K/uL Final  . Basophils Relative 01/04/2018 0  % Final  . Basophils Absolute 01/04/2018 0.0  0.0 - 0.1 K/uL Final  . Immature Granulocytes 01/04/2018 0  % Final  . Abs Immature Granulocytes 01/04/2018 0.02  0.00 - 0.07 K/uL Final   Performed at [facility], [address] [place], [place] [address]  . Sodium 01/04/2018 136  135 - 145 mmol/L Final  . Potassium 01/04/2018 4.0  3.5 - 5.1 mmol/L Final  . Chloride 01/04/2018 103  98 - 111 mmol/L Final  . CO2 01/04/2018 22  22 - 32 mmol/L Final  . Glucose, Bld 01/04/2018 169* 70 - 99 mg/dL Final  . BUN 01/04/2018 13  8 - 23 mg/dL Final  . Creatinine, Ser 01/04/2018  0.79  0.44 - 1.00 mg/dL Final  . Calcium 01/04/2018 9.0  8.9 - 10.3 mg/dL Final  . Total Protein 01/04/2018 7.6  6.5 - 8.1 g/dL Final  . Albumin 01/04/2018  3.6  3.5 - 5.0 g/dL Final  . AST 01/04/2018 12* 15 - 41 U/L Final  . ALT 01/04/2018 18  0 - 44 U/L Final  . Alkaline Phosphatase 01/04/2018 90  38 - 126 U/L Final  . Total Bilirubin 01/04/2018 0.5  0.3 - 1.2 mg/dL Final  . GFR calc non Af Amer 01/04/2018 >60  >60 mL/min Final  . GFR calc Af Amer 01/04/2018 >60  >60 mL/min Final   Comment: (NOTE) The eGFR has been calculated using the CKD EPI equation. This calculation has not been validated in all clinical situations. eGFR's persistently <60 mL/min signify possible Chronic Kidney Disease.   Georgiann Hahn gap 01/04/2018 11  5 - 15 Final   Performed at [facility], [address], [place], [place] [address]  . LDH 01/04/2018 127  98 - 192 U/L Final   Performed at [facility], [address] [place],  [address]     Pathology Orders Placed This Encounter  Procedures  . CBC with Differential/Platelet    Standing Status:   Future    Standing Expiration Date:   01/05/2019  . Comprehensive metabolic panel    Standing Status:   Future    Standing Expiration Date:   01/05/2019  . Lactate dehydrogenase    Standing Status:   Future    Standing Expiration Date:   01/05/2019  . Protein electrophoresis, serum    Standing Status:   Future    Standing Expiration Date:   01/05/2019  . IgG, IgA, IgM    Standing Status:   Future    Standing Expiration Date:   01/05/2019  . Kappa/lambda light chains    Standing Status:   Future    Standing Expiration Date:   01/05/2019       Zoila Shutter MD

## 2018-01-04 NOTE — Progress Notes (Signed)
0925 labs reviewed and patient seen by Dr. Walden Field who approved pt for Velcade injection today.   Jamie Ewing tolerated Velcade injection into right upper abdomen without incident or complaint. No swelling or pain at injection site. Pt discharged using rolling walker in satisfactory condition.

## 2018-01-05 ENCOUNTER — Other Ambulatory Visit (HOSPITAL_COMMUNITY): Payer: Self-pay | Admitting: Hematology

## 2018-01-05 DIAGNOSIS — C9000 Multiple myeloma not having achieved remission: Secondary | ICD-10-CM

## 2018-01-05 LAB — PROTEIN ELECTROPHORESIS, SERUM
A/G RATIO SPE: 0.9 (ref 0.7–1.7)
ALBUMIN ELP: 3.3 g/dL (ref 2.9–4.4)
Alpha-1-Globulin: 0.3 g/dL (ref 0.0–0.4)
Alpha-2-Globulin: 1 g/dL (ref 0.4–1.0)
Beta Globulin: 1.8 g/dL — ABNORMAL HIGH (ref 0.7–1.3)
Gamma Globulin: 0.7 g/dL (ref 0.4–1.8)
Globulin, Total: 3.8 g/dL (ref 2.2–3.9)
M-Spike, %: 0.8 g/dL — ABNORMAL HIGH
Total Protein ELP: 7.1 g/dL (ref 6.0–8.5)

## 2018-01-05 LAB — KAPPA/LAMBDA LIGHT CHAINS
KAPPA, LAMDA LIGHT CHAIN RATIO: 1.31 (ref 0.26–1.65)
Kappa free light chain: 18.1 mg/L (ref 3.3–19.4)
LAMDA FREE LIGHT CHAINS: 13.8 mg/L (ref 5.7–26.3)

## 2018-01-10 ENCOUNTER — Other Ambulatory Visit (HOSPITAL_COMMUNITY): Payer: Self-pay | Admitting: *Deleted

## 2018-01-10 DIAGNOSIS — C9000 Multiple myeloma not having achieved remission: Secondary | ICD-10-CM

## 2018-01-10 MED ORDER — LENALIDOMIDE 25 MG PO CAPS: ORAL_CAPSULE | ORAL | 0 refills | Status: DC

## 2018-01-10 NOTE — Telephone Encounter (Signed)
Chart reviewed, revlimid refilled. 

## 2018-01-11 ENCOUNTER — Encounter (HOSPITAL_COMMUNITY): Payer: Self-pay

## 2018-01-11 ENCOUNTER — Inpatient Hospital Stay (HOSPITAL_COMMUNITY): Payer: Medicare HMO

## 2018-01-11 ENCOUNTER — Other Ambulatory Visit: Payer: Self-pay

## 2018-01-11 VITALS — BP 129/52 | HR 90 | Temp 97.8°F | Resp 18 | Wt 169.4 lb

## 2018-01-11 DIAGNOSIS — Z5112 Encounter for antineoplastic immunotherapy: Secondary | ICD-10-CM | POA: Diagnosis not present

## 2018-01-11 DIAGNOSIS — C9000 Multiple myeloma not having achieved remission: Secondary | ICD-10-CM

## 2018-01-11 DIAGNOSIS — Z79899 Other long term (current) drug therapy: Secondary | ICD-10-CM | POA: Diagnosis not present

## 2018-01-11 LAB — CBC WITH DIFFERENTIAL/PLATELET
ABS IMMATURE GRANULOCYTES: 0.02 10*3/uL (ref 0.00–0.07)
BASOS PCT: 0 %
Basophils Absolute: 0 10*3/uL (ref 0.0–0.1)
Eosinophils Absolute: 0 10*3/uL (ref 0.0–0.5)
Eosinophils Relative: 0 %
HEMATOCRIT: 36 % (ref 36.0–46.0)
HEMOGLOBIN: 11.2 g/dL — AB (ref 12.0–15.0)
IMMATURE GRANULOCYTES: 0 %
LYMPHS ABS: 0.7 10*3/uL (ref 0.7–4.0)
LYMPHS PCT: 12 %
MCH: 28.3 pg (ref 26.0–34.0)
MCHC: 31.1 g/dL (ref 30.0–36.0)
MCV: 90.9 fL (ref 80.0–100.0)
MONOS PCT: 2 %
Monocytes Absolute: 0.1 10*3/uL (ref 0.1–1.0)
NEUTROS ABS: 5.2 10*3/uL (ref 1.7–7.7)
NEUTROS PCT: 86 %
PLATELETS: 266 10*3/uL (ref 150–400)
RBC: 3.96 MIL/uL (ref 3.87–5.11)
RDW: 18.5 % — ABNORMAL HIGH (ref 11.5–15.5)
WBC: 6 10*3/uL (ref 4.0–10.5)
nRBC: 0 % (ref 0.0–0.2)

## 2018-01-11 LAB — COMPREHENSIVE METABOLIC PANEL
ALK PHOS: 96 U/L (ref 38–126)
ALT: 14 U/L (ref 0–44)
AST: 15 U/L (ref 15–41)
Albumin: 3.6 g/dL (ref 3.5–5.0)
Anion gap: 9 (ref 5–15)
BUN: 14 mg/dL (ref 8–23)
CALCIUM: 9.4 mg/dL (ref 8.9–10.3)
CHLORIDE: 101 mmol/L (ref 98–111)
CO2: 24 mmol/L (ref 22–32)
CREATININE: 0.64 mg/dL (ref 0.44–1.00)
GFR calc Af Amer: >60 mL/min (ref >60)
Glucose, Bld: 153 mg/dL — ABNORMAL HIGH (ref 70–99)
Potassium: 3.7 mmol/L (ref 3.5–5.1)
Sodium: 134 mmol/L — ABNORMAL LOW (ref 135–145)
Total Bilirubin: 0.6 mg/dL (ref 0.3–1.2)
Total Protein: 7.3 g/dL (ref 6.5–8.1)

## 2018-01-11 MED ORDER — BORTEZOMIB CHEMO SQ INJECTION 3.5 MG (2.5MG/ML)
1.3000 mg/m2 | Freq: Once | INTRAMUSCULAR | Status: AC
  Administered 2018-01-11: 2.5 mg via SUBCUTANEOUS
  Filled 2018-01-11: qty 1

## 2018-01-11 MED ORDER — PROCHLORPERAZINE MALEATE 10 MG PO TABS: 10.0000 mg | ORAL_TABLET | Freq: Once | ORAL | Status: DC

## 2018-01-11 NOTE — Patient Instructions (Signed)
[facility] at [facility]  Discharge Instructions:   _______________________________________________________________  Thank you for choosing [facility] at [facility] to provide your oncology and hematology care.  To afford each patient quality time with our providers, please arrive at least 15 minutes before your scheduled appointment.  You need to re-schedule your appointment if you arrive 10 or more minutes late.  We strive to give you quality time with our providers, and arriving late affects you and other patients whose appointments are after yours.  Also, if you no show three or more times for appointments you may be dismissed from the clinic.  Again, thank you for choosing [facility] at [facility]. Our hope is that these requests will allow you access to exceptional care and in a timely manner. _______________________________________________________________  If you have questions after your visit, please contact our office at [phone] between the hours of 8:30 a.m. and 5:00 p.m. Voicemails left after 4:30 p.m. will not be returned until the following business day. _______________________________________________________________  For prescription refill requests, have your pharmacy contact our office. _______________________________________________________________  Recommendations made by the consultant and any test results will be sent to your referring physician. _______________________________________________________________ 

## 2018-01-11 NOTE — Progress Notes (Signed)
Pt ambulated on the unit for Velcade treatment today. No complaints of pain or changes since last assessment per patient. VSS. Labs drawn prior to WNL.   Treatment given today per MD orders. Tolerated infusion without adverse affects. Vital signs stable. No complaints at this time. Discharged from clinic ambulatory. F/U with [facility] as scheduled.

## 2018-01-18 ENCOUNTER — Encounter (HOSPITAL_COMMUNITY): Payer: Self-pay

## 2018-01-18 ENCOUNTER — Inpatient Hospital Stay (HOSPITAL_COMMUNITY): Payer: Medicare HMO

## 2018-01-18 ENCOUNTER — Other Ambulatory Visit: Payer: Self-pay

## 2018-01-18 VITALS — BP 125/50 | HR 92 | Temp 98.0°F | Wt 169.4 lb

## 2018-01-18 DIAGNOSIS — C9000 Multiple myeloma not having achieved remission: Secondary | ICD-10-CM | POA: Diagnosis not present

## 2018-01-18 DIAGNOSIS — Z79899 Other long term (current) drug therapy: Secondary | ICD-10-CM | POA: Diagnosis not present

## 2018-01-18 DIAGNOSIS — Z5112 Encounter for antineoplastic immunotherapy: Secondary | ICD-10-CM | POA: Diagnosis not present

## 2018-01-18 LAB — COMPREHENSIVE METABOLIC PANEL
ALT: 16 U/L (ref 0–44)
AST: 18 U/L (ref 15–41)
Albumin: 3.6 g/dL (ref 3.5–5.0)
Alkaline Phosphatase: 99 U/L (ref 38–126)
Anion gap: 13 (ref 5–15)
BILIRUBIN TOTAL: 0.7 mg/dL (ref 0.3–1.2)
BUN: 16 mg/dL (ref 8–23)
CALCIUM: 9.4 mg/dL (ref 8.9–10.3)
CO2: 22 mmol/L (ref 22–32)
CREATININE: 0.7 mg/dL (ref 0.44–1.00)
Chloride: 99 mmol/L (ref 98–111)
Glucose, Bld: 215 mg/dL — ABNORMAL HIGH (ref 70–99)
Potassium: 3.4 mmol/L — ABNORMAL LOW (ref 3.5–5.1)
Sodium: 134 mmol/L — ABNORMAL LOW (ref 135–145)
TOTAL PROTEIN: 7.4 g/dL (ref 6.5–8.1)

## 2018-01-18 LAB — CBC WITH DIFFERENTIAL/PLATELET
Abs Immature Granulocytes: 0.03 10*3/uL (ref 0.00–0.07)
Basophils Absolute: 0 10*3/uL (ref 0.0–0.1)
Basophils Relative: 0 %
EOS ABS: 0 10*3/uL (ref 0.0–0.5)
Eosinophils Relative: 0 %
HEMATOCRIT: 36.9 % (ref 36.0–46.0)
Hemoglobin: 11.3 g/dL — ABNORMAL LOW (ref 12.0–15.0)
IMMATURE GRANULOCYTES: 0 %
LYMPHS ABS: 0.6 10*3/uL — AB (ref 0.7–4.0)
Lymphocytes Relative: 6 %
MCH: 27.8 pg (ref 26.0–34.0)
MCHC: 30.6 g/dL (ref 30.0–36.0)
MCV: 90.9 fL (ref 80.0–100.0)
MONOS PCT: 2 %
Monocytes Absolute: 0.2 10*3/uL (ref 0.1–1.0)
NEUTROS ABS: 9.1 10*3/uL — AB (ref 1.7–7.7)
NEUTROS PCT: 92 %
Platelets: 299 10*3/uL (ref 150–400)
RBC: 4.06 MIL/uL (ref 3.87–5.11)
RDW: 18.6 % — ABNORMAL HIGH (ref 11.5–15.5)
WBC: 9.9 10*3/uL (ref 4.0–10.5)
nRBC: 0 % (ref 0.0–0.2)

## 2018-01-18 LAB — LACTATE DEHYDROGENASE: LDH: 120 U/L (ref 98–192)

## 2018-01-18 MED ORDER — PROCHLORPERAZINE MALEATE 10 MG PO TABS: 10.0000 mg | ORAL_TABLET | Freq: Once | ORAL | Status: DC

## 2018-01-18 MED ORDER — BORTEZOMIB CHEMO SQ INJECTION 3.5 MG (2.5MG/ML)
1.3000 mg/m2 | Freq: Once | INTRAMUSCULAR | Status: AC
  Administered 2018-01-18: 2.5 mg via SUBCUTANEOUS
  Filled 2018-01-18: qty 1

## 2018-01-18 NOTE — Progress Notes (Signed)
Treatment given per orders. Patient tolerated it well without problems. Vitals stable and discharged home from clinic ambulatory. Follow up as scheduled.  

## 2018-01-18 NOTE — Patient Instructions (Signed)
[facility] Discharge Instructions for Patients Receiving Chemotherapy   Beginning January 23rd 2017 lab work for the Cancer Center will be done in the  Main lab at [facility] on 1st floor. If you have a lab appointment with the Cancer Center please come in thru the  Main Entrance and check in at the main information desk   Today you received the following chemotherapy agents   To help prevent nausea and vomiting after your treatment, we encourage you to take your nausea medication     If you develop nausea and vomiting, or diarrhea that is not controlled by your medication, call the clinic.  The clinic phone number is [phone]. Office hours are Monday-Friday 8:30am-5:00pm.  BELOW ARE SYMPTOMS THAT SHOULD BE REPORTED IMMEDIATELY:  *FEVER GREATER THAN 101.0 F  *CHILLS WITH OR WITHOUT FEVER  NAUSEA AND VOMITING THAT IS NOT CONTROLLED WITH YOUR NAUSEA MEDICATION  *UNUSUAL SHORTNESS OF BREATH  *UNUSUAL BRUISING OR BLEEDING  TENDERNESS IN MOUTH AND THROAT WITH OR WITHOUT PRESENCE OF ULCERS  *URINARY PROBLEMS  *BOWEL PROBLEMS  UNUSUAL RASH Items with * indicate a potential emergency and should be followed up as soon as possible. If you have an emergency after office hours please contact your primary care physician or go to the nearest emergency department.  Please call the clinic during office hours if you have any questions or concerns.   You may also contact the Patient Navigator at [phone] should you have any questions or need assistance in obtaining follow up care.      Resources For Cancer Patients and their Caregivers ? American Cancer Society: Can assist with transportation, wigs, general needs, runs Look Good Feel Better.        1-888-227-6333 ? Cancer Care: Provides financial assistance, online support groups, medication/co-pay assistance.  1-800-813-HOPE (4673) ? [facility] Assists [place] cancer  patients and their families through emotional , educational and financial support.  336-427-4357 ? [place] DSS Where to apply for food stamps, Medicaid and utility assistance. 336-342-1394 ? [facility]: Transportation to medical appointments. 336-347-2287 ? Social Security Administration: May apply for disability if have a Stage IV cancer. 336-342-7796 1-800-772-1213 ? [place] Aging, Disability and Transit Services: Assists with nutrition, care and transit needs. 336-349-2343         

## 2018-01-18 NOTE — Progress Notes (Signed)
Dr. Delton Coombes on the unit. Treatment plan singed for Velcade. Labs reviewed. Proceed with treatment VO received.

## 2018-01-19 LAB — IGG, IGA, IGM
IGG (IMMUNOGLOBIN G), SERUM: 708 mg/dL (ref 700–1600)
IGM (IMMUNOGLOBULIN M), SRM: 32 mg/dL (ref 26–217)
IgA: 553 mg/dL — ABNORMAL HIGH (ref 64–422)

## 2018-01-19 LAB — KAPPA/LAMBDA LIGHT CHAINS
KAPPA FREE LGHT CHN: 16.5 mg/L (ref 3.3–19.4)
KAPPA, LAMDA LIGHT CHAIN RATIO: 1.07 (ref 0.26–1.65)
LAMDA FREE LIGHT CHAINS: 15.4 mg/L (ref 5.7–26.3)

## 2018-01-19 LAB — PROTEIN ELECTROPHORESIS, SERUM
A/G RATIO SPE: 1 (ref 0.7–1.7)
ALBUMIN ELP: 3.4 g/dL (ref 2.9–4.4)
ALPHA-1-GLOBULIN: 0.3 g/dL (ref 0.0–0.4)
ALPHA-2-GLOBULIN: 1 g/dL (ref 0.4–1.0)
Beta Globulin: 1.4 g/dL — ABNORMAL HIGH (ref 0.7–1.3)
GLOBULIN, TOTAL: 3.3 g/dL (ref 2.2–3.9)
Gamma Globulin: 0.5 g/dL (ref 0.4–1.8)
M-Spike, %: 0.5 g/dL — ABNORMAL HIGH
TOTAL PROTEIN ELP: 6.7 g/dL (ref 6.0–8.5)

## 2018-01-22 ENCOUNTER — Other Ambulatory Visit (HOSPITAL_COMMUNITY): Payer: Self-pay | Admitting: Internal Medicine

## 2018-01-22 DIAGNOSIS — C9000 Multiple myeloma not having achieved remission: Secondary | ICD-10-CM

## 2018-01-25 ENCOUNTER — Inpatient Hospital Stay (HOSPITAL_COMMUNITY): Payer: Medicare HMO

## 2018-01-25 ENCOUNTER — Inpatient Hospital Stay (HOSPITAL_BASED_OUTPATIENT_CLINIC_OR_DEPARTMENT_OTHER): Payer: Medicare HMO | Admitting: Hematology

## 2018-01-25 ENCOUNTER — Inpatient Hospital Stay (HOSPITAL_COMMUNITY): Payer: Medicare HMO | Attending: Hematology

## 2018-01-25 ENCOUNTER — Encounter (HOSPITAL_COMMUNITY): Payer: Self-pay | Admitting: Hematology

## 2018-01-25 ENCOUNTER — Other Ambulatory Visit: Payer: Self-pay

## 2018-01-25 VITALS — BP 129/53 | HR 89 | Temp 99.1°F | Wt 170.2 lb

## 2018-01-25 DIAGNOSIS — R609 Edema, unspecified: Secondary | ICD-10-CM

## 2018-01-25 DIAGNOSIS — E876 Hypokalemia: Secondary | ICD-10-CM | POA: Insufficient documentation

## 2018-01-25 DIAGNOSIS — C9000 Multiple myeloma not having achieved remission: Secondary | ICD-10-CM | POA: Insufficient documentation

## 2018-01-25 DIAGNOSIS — Z5112 Encounter for antineoplastic immunotherapy: Secondary | ICD-10-CM | POA: Insufficient documentation

## 2018-01-25 DIAGNOSIS — Z87891 Personal history of nicotine dependence: Secondary | ICD-10-CM

## 2018-01-25 LAB — COMPREHENSIVE METABOLIC PANEL
ALT: 13 U/L (ref 0–44)
AST: 14 U/L — AB (ref 15–41)
Albumin: 3.6 g/dL (ref 3.5–5.0)
Alkaline Phosphatase: 98 U/L (ref 38–126)
Anion gap: 11 (ref 5–15)
BUN: 14 mg/dL (ref 8–23)
CALCIUM: 9.1 mg/dL (ref 8.9–10.3)
CO2: 23 mmol/L (ref 22–32)
Chloride: 102 mmol/L (ref 98–111)
Creatinine, Ser: 0.71 mg/dL (ref 0.44–1.00)
GFR calc Af Amer: >60 mL/min (ref >60)
GFR calc non Af Amer: >60 mL/min (ref >60)
GLUCOSE: 165 mg/dL — AB (ref 70–99)
Potassium: 3.9 mmol/L (ref 3.5–5.1)
Sodium: 136 mmol/L (ref 135–145)
Total Bilirubin: 0.5 mg/dL (ref 0.3–1.2)
Total Protein: 6.9 g/dL (ref 6.5–8.1)

## 2018-01-25 LAB — CBC WITH DIFFERENTIAL/PLATELET
Abs Immature Granulocytes: 0.02 10*3/uL (ref 0.00–0.07)
Basophils Absolute: 0 10*3/uL (ref 0.0–0.1)
Basophils Relative: 0 %
EOS ABS: 0.3 10*3/uL (ref 0.0–0.5)
Eosinophils Relative: 4 %
HEMATOCRIT: 36.4 % (ref 36.0–46.0)
Hemoglobin: 11.2 g/dL — ABNORMAL LOW (ref 12.0–15.0)
IMMATURE GRANULOCYTES: 0 %
LYMPHS ABS: 0.9 10*3/uL (ref 0.7–4.0)
LYMPHS PCT: 13 %
MCH: 28.5 pg (ref 26.0–34.0)
MCHC: 30.8 g/dL (ref 30.0–36.0)
MCV: 92.6 fL (ref 80.0–100.0)
Monocytes Absolute: 1 10*3/uL (ref 0.1–1.0)
Monocytes Relative: 15 %
NEUTROS PCT: 68 %
Neutro Abs: 4.3 10*3/uL (ref 1.7–7.7)
Platelets: 216 10*3/uL (ref 150–400)
RBC: 3.93 MIL/uL (ref 3.87–5.11)
RDW: 18.5 % — ABNORMAL HIGH (ref 11.5–15.5)
WBC: 6.4 10*3/uL (ref 4.0–10.5)
nRBC: 0 % (ref 0.0–0.2)

## 2018-01-25 MED ORDER — BORTEZOMIB CHEMO SQ INJECTION 3.5 MG (2.5MG/ML)
1.3000 mg/m2 | Freq: Once | INTRAMUSCULAR | Status: AC
  Administered 2018-01-25: 2.5 mg via SUBCUTANEOUS
  Filled 2018-01-25: qty 1

## 2018-01-25 MED ORDER — PROCHLORPERAZINE MALEATE 10 MG PO TABS: 10.0000 mg | ORAL_TABLET | Freq: Once | ORAL | Status: DC

## 2018-01-25 NOTE — Assessment & Plan Note (Signed)
1.  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 - Again presented to the ER on 10/06/2017 with difficulty walking and tingling on the dorsum of both feet.  MRI of the lumbar spine showed multiple lesions in the thoracic and lumbar vertebrae.  Pathological compression fracture of the T10 vertebral body was seen.  There is an epidural soft tissue mass measuring 9 x 15 x 44 mm at T10 vertebral body level. -T9-T10 laminectomy with resection of epidural tumor T10 bilateral transpedicular decompression/partial corpectomy on 10/06/2017, pathology consistent with plasma cell neoplasm. -Skeletal survey on 10/12/2017 shows lytic lesions in the skull and left femur. - SPEP shows 2.1 g of IgA kappa monoclonal protein.  Free light chain ratio was 1.69.  Normal renal function with a normal calcium level.  Beta-2 microglobulin was 1.4.  LDH was normal. -She underwent a bone marrow aspiration biopsy on 10/12/2017.  This showed 9% plasma cells in the aspirate and 20% CD38 positive cells on core biopsy.  Chromosome analysis shows 29, XY.  FISH panel positive for +11, 13q-/-13. - Completed radiation to the spine, 20 Gy in 10 fractions on 11/10/2017. - She was started on Velcade and dexamethasone on 11/14/2017.  Baseline SPEP shows 2.4 g/dL of M spike.  She has tolerated Velcade very well. -She started her first cycle of Revlimid on 11/21/2017.  She has tolerated cycle 1 very well. - Cycle 2 of VRD on 12/14/2017 and cycle 3 on 01/04/2018. -We have reviewed myeloma panel with M spike at 2.4 on 11/14/2017, improved to 0.5 g on 01/18/2018.  Free light chain ratio has also improved from 1.69-1.07. - I have talked to her about sending her to a transplant center for evaluation.  Her functional status is good. - I have talked to Dr. Norma Fredrickson who  kindly agreed to see this patient in the next few weeks. - Today she will start her cycle 4 without any dose modifications.  2.  Bone strengthening: -Last Zometa was on 12/21/2017.  Her calcium is within normal limits for her to proceed with today's treatment.  3.  Hypokalemia: -Potassium is normal today.  She will continue potassium 20 mEq daily.

## 2018-01-25 NOTE — Progress Notes (Signed)
Patient is taking Revlimid as prescribed (no missed doses) and reports no side effects related to Revlimid.

## 2018-01-25 NOTE — Progress Notes (Signed)
Patient referred to [facility] for bone marrow transplant consult.  Appointment with Dr. Tiana Loft 03/05/18 @ 8AM.  New patient packet to be mailed to patient from [facility], per [facility].  No request for records to be faxed, as same are available on Care Everywhere.

## 2018-01-25 NOTE — Progress Notes (Signed)
Jamie Ewing, [place] [address]   CLINIC:  Medical Oncology/Hematology  PCP:  Asencion Noble, MD [address] [place] [address] [phone]   REASON FOR VISIT: Follow-up for multiple myeloma  CURRENT THERAPY: Revlimid, Velcade, dexamethasone  BRIEF ONCOLOGIC HISTORY:    Multiple myeloma ([place])   10/10/2017 Initial Diagnosis    Multiple myeloma ([place])    11/08/2017 -  Chemotherapy    The patient had bortezomib SQ (VELCADE) chemo injection 2.5 mg, 1.3 mg/m2 = 2.5 mg, Subcutaneous,  Once, 4 of 6 cycles Administration: 2.5 mg (11/14/2017), 2.5 mg (11/21/2017), 2.5 mg (11/28/2017), 2.5 mg (12/14/2017), 2.5 mg (12/21/2017), 2.5 mg (12/28/2017), 2.5 mg (01/04/2018), 2.5 mg (01/11/2018), 2.5 mg (01/18/2018), 2.5 mg (01/25/2018)  for chemotherapy treatment.      INTERVAL HISTORY:  Jamie Ewing 77 y.o. female returns for routine follow-upfor multiple myeloma. Patient is here alone today and she is tolerating treatment well. She is still wearing her back brace for support and pain control. She has bilateral lower leg edema that decreases at night when she has her feet elevated. She has mild numbness that is stable at this time. She denies any nausea, vomiting, or diarrhea. Denies any fevers, night sweats, chills, or weight loss. Denies any headaches or vision changes. Denies any new pains. She reports her appetite at 100% and is maintaining her weight well. Her energy level at 100%.      REVIEW OF SYSTEMS:  Review of Systems  Cardiovascular: Positive for leg swelling.  Neurological: Positive for numbness.  All other systems reviewed and are negative.    PAST MEDICAL/SURGICAL HISTORY:  Past Medical History:  Diagnosis Date  . Back pain   . Hypercholesteremia   . Hypertension    Past Surgical History:  Procedure Laterality Date  . BONE MARROW BIOPSY    . LAMINECTOMY N/A 10/06/2017   Procedure: THORACIC NINE AND TEN LAMINECTOMY WITH RESECTION OF  TUMOR, THORACIC EIGHT TO THORACIC ELEVEN FUSION WITH PEDICLE SCREW FIXATION;  Surgeon: Earnie Larsson, MD;  Location: [facility];  Service: Neurosurgery;  Laterality: N/A;  . TOTAL KNEE ARTHROPLASTY  2001     SOCIAL HISTORY:  Social History   Socioeconomic History  . Marital status: Widowed    Spouse name: Not on file  . Number of children: 2  . Years of education: Not on file  . Highest education level: Not on file  Occupational History  . Not on file  Social Needs  . Financial resource strain: Not very hard  . Food insecurity:    Worry: Never true    Inability: Never true  . Transportation needs:    Medical: No    Non-medical: No  Tobacco Use  . Smoking status: Former Smoker    Packs/day: 0.50    Years: 30.00    Pack years: 15.00    Types: Cigarettes    Last attempt to quit: 05/31/2006    Years since quitting: 11.6  . Smokeless tobacco: Never Used  Substance and Sexual Activity  . Alcohol use: Yes    Comment: wine occassionally  . Drug use: No  . Sexual activity: Not on file  Lifestyle  . Physical activity:    Days per week: 4 days    Minutes per session: 120 min  . Stress: Not at all  Relationships  . Social connections:    Talks on phone: More than three times a week    Gets together: Once a week  Attends religious service: More than 4 times per year    Active member of club or organization: Yes    Attends meetings of clubs or organizations: More than 4 times per year    Relationship status: Widowed  . Intimate partner violence:    Fear of current or ex partner: No    Emotionally abused: No    Physically abused: No    Forced sexual activity: No  Other Topics Concern  . Not on file  Social History Narrative  . Not on file    FAMILY HISTORY:  Family History  Problem Relation Age of Onset  . Tuberculosis Mother   . Kidney failure Father   . Hypertension Paternal Aunt   . Diabetes Paternal Aunt   . Hypertension Paternal Uncle   . Diabetes Paternal Uncle    . Hypertension Daughter   . Stroke Daughter     CURRENT MEDICATIONS:  Outpatient Encounter Medications as of 01/25/2018  Medication Sig  . acetaminophen (TYLENOL) 650 MG CR tablet Take 1,300 mg by mouth every 8 (eight) hours as needed for pain.  [name] acyclovir (ZOVIRAX) 400 MG tablet Take 1 tablet (400 mg total) by mouth 2 (two) times daily.  [name] amLODipine (NORVASC) 5 MG tablet Take 5 mg by mouth daily.   . Bortezomib (VELCADE IJ) Inject as directed once a week.   [name] dexamethasone (DECADRON) 4 MG tablet Take 10 tablets (40 mg) on days 1, 8, and 15 of chemo. Repeat every 21 days.  . hydrochlorothiazide (HYDRODIURIL) 25 MG tablet Take 25 mg by mouth daily.  [name] lenalidomide (REVLIMID) 25 MG capsule Take one capsule daily on days 1-14 every 21 days.  . potassium chloride SA (K-DUR,KLOR-CON) 20 MEQ tablet Take 1 tablet (20 mEq total) by mouth daily.  . ondansetron (ZOFRAN) 4 MG tablet Take 1 tablet (4 mg total) by mouth every 8 (eight) hours as needed for nausea or vomiting. (Patient not taking: Reported on 01/25/2018)  . prochlorperazine (COMPAZINE) 10 MG tablet Take 1 tablet (10 mg total) by mouth every 6 (six) hours as needed (Nausea or vomiting). (Patient not taking: Reported on 01/25/2018)  . [DISCONTINUED] cyclobenzaprine (FLEXERIL) 5 MG tablet cyclobenzaprine 5 mg tablet  TAKE 1 TO 2 TABLETS BY MOUTH EVERY 6 HOURS AS NEEDED FOR MUSCLE SPASM  . [DISCONTINUED] diazepam (VALIUM) 5 MG tablet Take 1-2 tablets (5-10 mg total) by mouth every 6 (six) hours as needed for muscle spasms.  . [DISCONTINUED] HYDROcodone-acetaminophen (NORCO/VICODIN) 5-325 MG tablet hydrocodone 5 mg-acetaminophen 325 mg tablet  . [DISCONTINUED] naproxen (NAPROSYN) 500 MG tablet naproxen 500 mg tablet  TAKE 1 TABLET BY MOUTH TWICE A DAY   No facility-administered encounter medications on file as of 01/25/2018.     ALLERGIES:  No Known Allergies   PHYSICAL EXAM:  ECOG Performance status: 1  Vitals:   01/25/18 0908  BP:  (!) 129/53  Pulse: 89  Temp: 99.1 F (37.3 C)  SpO2: 99%   Filed Weights   01/25/18 0908  Weight: 170 lb 3.2 oz (77.2 kg)    Physical Exam  Constitutional: She is oriented to person, place, and time. She appears well-developed and well-nourished.  Cardiovascular: Normal rate, regular rhythm and normal heart sounds.  Pulmonary/Chest: Effort normal and breath sounds normal.  Musculoskeletal: Normal range of motion.  Neurological: She is alert and oriented to person, place, and time.  Skin: Skin is warm and dry.  Psychiatric: She has a normal mood and affect. Her behavior is normal. Judgment and  thought content normal.     LABORATORY DATA:  I have reviewed the labs as listed.  CBC    Component Value Date/Time   WBC 6.4 01/25/2018 0838   RBC 3.93 01/25/2018 0838   HGB 11.2 (L) 01/25/2018 0838   HCT 36.4 01/25/2018 0838   PLT 216 01/25/2018 0838   MCV 92.6 01/25/2018 0838   MCH 28.5 01/25/2018 0838   MCHC 30.8 01/25/2018 0838   RDW 18.5 (H) 01/25/2018 0838   LYMPHSABS 0.9 01/25/2018 0838   MONOABS 1.0 01/25/2018 0838   EOSABS 0.3 01/25/2018 0838   BASOSABS 0.0 01/25/2018 0838   CMP Latest Ref Rng & Units 01/25/2018 01/18/2018 01/11/2018  Glucose 70 - 99 mg/dL 165(H) 215(H) 153(H)  BUN 8 - 23 mg/dL '14 16 14  '$ Creatinine 0.44 - 1.00 mg/dL 0.71 0.70 0.64  Sodium 135 - 145 mmol/L 136 134(L) 134(L)  Potassium 3.5 - 5.1 mmol/L 3.9 3.4(L) 3.7  Chloride 98 - 111 mmol/L 102 99 101  CO2 22 - 32 mmol/L '23 22 24  '$ Calcium 8.9 - 10.3 mg/dL 9.1 9.4 9.4  Total Protein 6.5 - 8.1 g/dL 6.9 7.4 7.3  Total Bilirubin 0.3 - 1.2 mg/dL 0.5 0.7 0.6  Alkaline Phos 38 - 126 U/L 98 99 96  AST 15 - 41 U/L 14(L) 18 15  ALT 0 - 44 U/L '13 16 14          '$ ASSESSMENT & PLAN:   Multiple myeloma ([facility]) 1.  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 -Presented to the emergency room on 09/29/2017 with  back pain, normal x-ray of the lumbar spine. - Again presented to the ER on 10/06/2017 with difficulty walking and tingling on the dorsum of both feet.  MRI of the lumbar spine showed multiple lesions in the thoracic and lumbar vertebrae.  Pathological compression fracture of the T10 vertebral body was seen.  There is an epidural soft tissue mass measuring 9 x 15 x 44 mm at T10 vertebral body level. -T9-T10 laminectomy with resection of epidural tumor T10 bilateral transpedicular decompression/partial corpectomy on 10/06/2017, pathology consistent with plasma cell neoplasm. -Skeletal survey on 10/12/2017 shows lytic lesions in the skull and left femur. - SPEP shows 2.1 g of IgA kappa monoclonal protein.  Free light chain ratio was 1.69.  Normal renal function with a normal calcium level.  Beta-2 microglobulin was 1.4.  LDH was normal. -She underwent a bone marrow aspiration biopsy on 10/12/2017.  This showed 9% plasma cells in the aspirate and 20% CD38 positive cells on core biopsy.  Chromosome analysis shows 34, XY.  FISH panel positive for +11, 13q-/-13. - Completed radiation to the spine, 20 Gy in 10 fractions on 11/10/2017. - She was started on Velcade and dexamethasone on 11/14/2017.  Baseline SPEP shows 2.4 g/dL of M spike.  She has tolerated Velcade very well. -She started her first cycle of Revlimid on 11/21/2017.  She has tolerated cycle 1 very well. - Cycle 2 of VRD on 12/14/2017 and cycle 3 on 01/04/2018. -We have reviewed myeloma panel with M spike at 2.4 on 11/14/2017, improved to 0.5 g on 01/18/2018.  Free light chain ratio has also improved from 1.69-1.07. - I have talked to her about sending her to a transplant center for evaluation.  Her functional status is good. - I have talked to Dr. Norma Fredrickson who kindly agreed to see this patient in  the next few weeks. - Today she will start her cycle 4 without any dose modifications.  2.  Bone strengthening: -Last Zometa was on 12/21/2017.  Her calcium is  within normal limits for her to proceed with today's treatment.  3.  Hypokalemia: -Potassium is normal today.  She will continue potassium 20 mEq daily.      Orders placed this encounter:  Orders Placed This Encounter  Procedures  . Lactate dehydrogenase  . Protein electrophoresis, serum  . Kappa/lambda light chains  . CBC with Differential/Platelet  . Comprehensive metabolic panel  . IgG, IgA, IgM  . CBC with Differential/Platelet  . Comprehensive metabolic panel      Derek Jack, MD [place] [phone]

## 2018-01-25 NOTE — Patient Instructions (Signed)
[place] at [facility] Discharge Instructions  Follow up in 3 weeks with labs. Continue your normal treatment cycle with labs. We will refer you to [facility] for transplant.    Thank you for choosing [place] at [facility] to provide your oncology and hematology care.  To afford each patient quality time with our provider, please arrive at least 15 minutes before your scheduled appointment time.   If you have a lab appointment with the [facility] please come in thru the  Main Entrance and check in at the main information desk  You need to re-schedule your appointment should you arrive 10 or more minutes late.  We strive to give you quality time with our providers, and arriving late affects you and other patients whose appointments are after yours.  Also, if you no show three or more times for appointments you may be dismissed from the clinic at the providers discretion.     Again, thank you for choosing [facility].  Our hope is that these requests will decrease the amount of time that you wait before being seen by our physicians.       _____________________________________________________________  Should you have questions after your visit to [facility], please contact our office at [phone] between the hours of 8:00 a.m. and 4:30 p.m.  Voicemails left after 4:00 p.m. will not be returned until the following business day.  For prescription refill requests, have your pharmacy contact our office and allow 72 hours.    Cancer Center Support Programs:   > Cancer Support Group  2nd Tuesday of the month 1pm-2pm, Journey Room

## 2018-01-25 NOTE — Progress Notes (Signed)
Anderson Malta at [facility] called to inform that patient's status was upgraded and her new consult appointment date, with Dr Norma Fredrickson, is 02/12/18 @ 1PM.

## 2018-01-25 NOTE — Progress Notes (Signed)
Jamie Ewing presents today for injection per the provider's orders.  Velcade administration without incident; see MAR for injection details.  Patient tolerated procedure well and without incident.  No questions or complaints noted at this time.  Discharged ambulatory.

## 2018-02-01 ENCOUNTER — Encounter (HOSPITAL_COMMUNITY): Payer: Self-pay

## 2018-02-01 ENCOUNTER — Inpatient Hospital Stay (HOSPITAL_COMMUNITY): Payer: Medicare HMO

## 2018-02-01 VITALS — BP 132/58 | HR 94 | Temp 98.4°F | Resp 18 | Wt 171.8 lb

## 2018-02-01 DIAGNOSIS — C9000 Multiple myeloma not having achieved remission: Secondary | ICD-10-CM

## 2018-02-01 DIAGNOSIS — Z87891 Personal history of nicotine dependence: Secondary | ICD-10-CM | POA: Diagnosis not present

## 2018-02-01 DIAGNOSIS — E876 Hypokalemia: Secondary | ICD-10-CM | POA: Diagnosis not present

## 2018-02-01 DIAGNOSIS — Z5112 Encounter for antineoplastic immunotherapy: Secondary | ICD-10-CM | POA: Diagnosis not present

## 2018-02-01 LAB — CBC WITH DIFFERENTIAL/PLATELET
ABS IMMATURE GRANULOCYTES: 0.03 10*3/uL (ref 0.00–0.07)
BASOS ABS: 0 10*3/uL (ref 0.0–0.1)
Basophils Relative: 0 %
Eosinophils Absolute: 0.1 10*3/uL (ref 0.0–0.5)
Eosinophils Relative: 1 %
HEMATOCRIT: 37.3 % (ref 36.0–46.0)
HEMOGLOBIN: 11.9 g/dL — AB (ref 12.0–15.0)
Immature Granulocytes: 0 %
LYMPHS ABS: 0.7 10*3/uL (ref 0.7–4.0)
LYMPHS PCT: 9 %
MCH: 29.2 pg (ref 26.0–34.0)
MCHC: 31.9 g/dL (ref 30.0–36.0)
MCV: 91.6 fL (ref 80.0–100.0)
MONO ABS: 0.2 10*3/uL (ref 0.1–1.0)
Monocytes Relative: 2 %
Neutro Abs: 6.7 10*3/uL (ref 1.7–7.7)
Neutrophils Relative %: 88 %
Platelets: 232 10*3/uL (ref 150–400)
RBC: 4.07 MIL/uL (ref 3.87–5.11)
RDW: 19 % — ABNORMAL HIGH (ref 11.5–15.5)
WBC: 7.7 10*3/uL (ref 4.0–10.5)
nRBC: 0 % (ref 0.0–0.2)

## 2018-02-01 LAB — COMPREHENSIVE METABOLIC PANEL
ALBUMIN: 3.9 g/dL (ref 3.5–5.0)
ALK PHOS: 92 U/L (ref 38–126)
ALT: 13 U/L (ref 0–44)
ANION GAP: 12 (ref 5–15)
AST: 17 U/L (ref 15–41)
BILIRUBIN TOTAL: 0.5 mg/dL (ref 0.3–1.2)
BUN: 16 mg/dL (ref 8–23)
CALCIUM: 9.8 mg/dL (ref 8.9–10.3)
CO2: 23 mmol/L (ref 22–32)
CREATININE: 0.7 mg/dL (ref 0.44–1.00)
Chloride: 99 mmol/L (ref 98–111)
GFR calc Af Amer: >60 mL/min (ref >60)
GFR calc non Af Amer: >60 mL/min (ref >60)
Glucose, Bld: 125 mg/dL — ABNORMAL HIGH (ref 70–99)
Potassium: 3.8 mmol/L (ref 3.5–5.1)
Sodium: 134 mmol/L — ABNORMAL LOW (ref 135–145)
TOTAL PROTEIN: 7.4 g/dL (ref 6.5–8.1)

## 2018-02-01 MED ORDER — PROCHLORPERAZINE MALEATE 10 MG PO TABS: 10.0000 mg | ORAL_TABLET | Freq: Once | ORAL | Status: DC

## 2018-02-01 MED ORDER — BORTEZOMIB CHEMO SQ INJECTION 3.5 MG (2.5MG/ML)
1.3000 mg/m2 | Freq: Once | INTRAMUSCULAR | Status: AC
  Administered 2018-02-01: 2.5 mg via SUBCUTANEOUS
  Filled 2018-02-01: qty 1

## 2018-02-01 NOTE — Progress Notes (Signed)
Patient tolerated injection with no complaints voiced.  Site clean and dry with band aid applied.  No bruising or swelling noted at site.  VSs with discharge and left ambulatory with no s/s of distress noted.

## 2018-02-01 NOTE — Patient Instructions (Signed)
[facility] Discharge Instructions for Patients Receiving Chemotherapy  Today you received the following chemotherapy agents velcade.    If you develop nausea and vomiting that is not controlled by your nausea medication, call the clinic.   BELOW ARE SYMPTOMS THAT SHOULD BE REPORTED IMMEDIATELY:  *FEVER GREATER THAN 100.5 F  *CHILLS WITH OR WITHOUT FEVER  NAUSEA AND VOMITING THAT IS NOT CONTROLLED WITH YOUR NAUSEA MEDICATION  *UNUSUAL SHORTNESS OF BREATH  *UNUSUAL BRUISING OR BLEEDING  TENDERNESS IN MOUTH AND THROAT WITH OR WITHOUT PRESENCE OF ULCERS  *URINARY PROBLEMS  *BOWEL PROBLEMS  UNUSUAL RASH Items with * indicate a potential emergency and should be followed up as soon as possible.  Feel free to call the clinic should you have any questions or concerns. The clinic phone number is [phone].  Please show the CHEMO ALERT CARD at check-in to the Emergency Department and triage nurse.   

## 2018-02-02 ENCOUNTER — Other Ambulatory Visit (HOSPITAL_COMMUNITY): Payer: Self-pay | Admitting: Hematology

## 2018-02-02 DIAGNOSIS — C9000 Multiple myeloma not having achieved remission: Secondary | ICD-10-CM

## 2018-02-07 DIAGNOSIS — C9000 Multiple myeloma not having achieved remission: Secondary | ICD-10-CM | POA: Diagnosis not present

## 2018-02-08 ENCOUNTER — Other Ambulatory Visit (HOSPITAL_COMMUNITY): Payer: Self-pay | Admitting: *Deleted

## 2018-02-08 ENCOUNTER — Inpatient Hospital Stay (HOSPITAL_COMMUNITY): Payer: Medicare HMO

## 2018-02-08 VITALS — BP 120/52 | HR 92 | Temp 98.0°F | Resp 18 | Wt 171.8 lb

## 2018-02-08 DIAGNOSIS — C9000 Multiple myeloma not having achieved remission: Secondary | ICD-10-CM

## 2018-02-08 DIAGNOSIS — Z5112 Encounter for antineoplastic immunotherapy: Secondary | ICD-10-CM | POA: Diagnosis not present

## 2018-02-08 DIAGNOSIS — Z87891 Personal history of nicotine dependence: Secondary | ICD-10-CM | POA: Diagnosis not present

## 2018-02-08 DIAGNOSIS — E876 Hypokalemia: Secondary | ICD-10-CM | POA: Diagnosis not present

## 2018-02-08 LAB — COMPREHENSIVE METABOLIC PANEL
ALK PHOS: 93 U/L (ref 38–126)
ALT: 12 U/L (ref 0–44)
ANION GAP: 11 (ref 5–15)
AST: 17 U/L (ref 15–41)
Albumin: 3.7 g/dL (ref 3.5–5.0)
BILIRUBIN TOTAL: 0.6 mg/dL (ref 0.3–1.2)
BUN: 13 mg/dL (ref 8–23)
CALCIUM: 8.8 mg/dL — AB (ref 8.9–10.3)
CO2: 21 mmol/L — ABNORMAL LOW (ref 22–32)
CREATININE: 0.8 mg/dL (ref 0.44–1.00)
Chloride: 101 mmol/L (ref 98–111)
Glucose, Bld: 172 mg/dL — ABNORMAL HIGH (ref 70–99)
Potassium: 3.6 mmol/L (ref 3.5–5.1)
Sodium: 133 mmol/L — ABNORMAL LOW (ref 135–145)
TOTAL PROTEIN: 7 g/dL (ref 6.5–8.1)

## 2018-02-08 LAB — CBC WITH DIFFERENTIAL/PLATELET
Abs Immature Granulocytes: 0.03 10*3/uL (ref 0.00–0.07)
Basophils Absolute: 0 10*3/uL (ref 0.0–0.1)
Basophils Relative: 0 %
EOS PCT: 0 %
Eosinophils Absolute: 0 10*3/uL (ref 0.0–0.5)
HEMATOCRIT: 37.9 % (ref 36.0–46.0)
Hemoglobin: 11.9 g/dL — ABNORMAL LOW (ref 12.0–15.0)
Immature Granulocytes: 0 %
LYMPHS ABS: 0.6 10*3/uL — AB (ref 0.7–4.0)
Lymphocytes Relative: 7 %
MCH: 28.6 pg (ref 26.0–34.0)
MCHC: 31.4 g/dL (ref 30.0–36.0)
MCV: 91.1 fL (ref 80.0–100.0)
MONOS PCT: 3 %
Monocytes Absolute: 0.3 10*3/uL (ref 0.1–1.0)
NRBC: 0 % (ref 0.0–0.2)
Neutro Abs: 7.7 10*3/uL (ref 1.7–7.7)
Neutrophils Relative %: 90 %
Platelets: 274 10*3/uL (ref 150–400)
RBC: 4.16 MIL/uL (ref 3.87–5.11)
RDW: 18.6 % — AB (ref 11.5–15.5)
WBC: 8.6 10*3/uL (ref 4.0–10.5)

## 2018-02-08 LAB — LACTATE DEHYDROGENASE: LDH: 121 U/L (ref 98–192)

## 2018-02-08 MED ORDER — PROCHLORPERAZINE MALEATE 10 MG PO TABS: 10.0000 mg | ORAL_TABLET | Freq: Once | ORAL | Status: DC

## 2018-02-08 MED ORDER — BORTEZOMIB CHEMO SQ INJECTION 3.5 MG (2.5MG/ML)
1.3000 mg/m2 | Freq: Once | INTRAMUSCULAR | Status: AC
  Administered 2018-02-08: 2.5 mg via SUBCUTANEOUS
  Filled 2018-02-08: qty 1

## 2018-02-08 MED ORDER — LENALIDOMIDE 25 MG PO CAPS: ORAL_CAPSULE | ORAL | 0 refills | Status: DC

## 2018-02-08 NOTE — Progress Notes (Signed)
Pt given Velcade injection today.   Tolerated  without adverse affects. No complaints at this time. Discharged from clinic ambulatory. F/U with [facility] as scheduled.

## 2018-02-08 NOTE — Patient Instructions (Signed)
[facility] Discharge Instructions for Patients Receiving Chemotherapy   Beginning January 23rd 2017 lab work for the Cancer Center will be done in the  Main lab at [place] on 1st floor. If you have a lab appointment with the Cancer Center please come in thru the  Main Entrance and check in at the main information desk   Today you received the following chemotherapy agents Velcade  To help prevent nausea and vomiting after your treatment, we encourage you to take your nausea medication    If you develop nausea and vomiting, or diarrhea that is not controlled by your medication, call the clinic.  The clinic phone number is [phone]. Office hours are Monday-Friday 8:30am-5:00pm.  BELOW ARE SYMPTOMS THAT SHOULD BE REPORTED IMMEDIATELY:  *FEVER GREATER THAN 101.0 F  *CHILLS WITH OR WITHOUT FEVER  NAUSEA AND VOMITING THAT IS NOT CONTROLLED WITH YOUR NAUSEA MEDICATION  *UNUSUAL SHORTNESS OF BREATH  *UNUSUAL BRUISING OR BLEEDING  TENDERNESS IN MOUTH AND THROAT WITH OR WITHOUT PRESENCE OF ULCERS  *URINARY PROBLEMS  *BOWEL PROBLEMS  UNUSUAL RASH Items with * indicate a potential emergency and should be followed up as soon as possible. If you have an emergency after office hours please contact your primary care physician or go to the nearest emergency department.  Please call the clinic during office hours if you have any questions or concerns.   You may also contact the Patient Navigator at [phone] should you have any questions or need assistance in obtaining follow up care.      Resources For Cancer Patients and their Caregivers ? American Cancer Society: Can assist with transportation, wigs, general needs, runs Look Good Feel Better.        1-888-227-6333 ? Cancer Care: Provides financial assistance, online support groups, medication/co-pay assistance.  1-800-813-HOPE (4673) ? [facility] Assists [place] cancer  patients and their families through emotional , educational and financial support.  336-427-4357 ? [place] DSS Where to apply for food stamps, Medicaid and utility assistance. 336-342-1394 ? [facility]: Transportation to medical appointments. 336-347-2287 ? Social Security Administration: May apply for disability if have a Stage IV cancer. 336-342-7796 1-800-772-1213 ? [place] Aging, Disability and Transit Services: Assists with nutrition, care and transit needs. 336-349-2343          

## 2018-02-08 NOTE — Progress Notes (Signed)
Jamie Ewing tolerated Velcade injection without incident or complaint. VSS. Labs reviewed. Discharged self ambulatory in satisfactory condition.

## 2018-02-08 NOTE — Telephone Encounter (Signed)
Chart reviewed, revlimid refilled. 

## 2018-02-09 LAB — PROTEIN ELECTROPHORESIS, SERUM
A/G Ratio: 1.2 (ref 0.7–1.7)
ALBUMIN ELP: 3.6 g/dL (ref 2.9–4.4)
ALPHA-1-GLOBULIN: 0.3 g/dL (ref 0.0–0.4)
Alpha-2-Globulin: 1 g/dL (ref 0.4–1.0)
Beta Globulin: 1.2 g/dL (ref 0.7–1.3)
GLOBULIN, TOTAL: 3.1 g/dL (ref 2.2–3.9)
Gamma Globulin: 0.5 g/dL (ref 0.4–1.8)
TOTAL PROTEIN ELP: 6.7 g/dL (ref 6.0–8.5)

## 2018-02-09 LAB — IGG, IGA, IGM
IGA: 324 mg/dL (ref 64–422)
IGG (IMMUNOGLOBIN G), SERUM: 632 mg/dL — AB (ref 700–1600)
IgM (Immunoglobulin M), Srm: 24 mg/dL — ABNORMAL LOW (ref 26–217)

## 2018-02-09 LAB — KAPPA/LAMBDA LIGHT CHAINS
KAPPA FREE LGHT CHN: 16.4 mg/L (ref 3.3–19.4)
Kappa, lambda light chain ratio: 1.34 (ref 0.26–1.65)
LAMDA FREE LIGHT CHAINS: 12.2 mg/L (ref 5.7–26.3)

## 2018-02-12 ENCOUNTER — Encounter: Payer: Self-pay | Admitting: Hematology

## 2018-02-12 DIAGNOSIS — C9000 Multiple myeloma not having achieved remission: Secondary | ICD-10-CM | POA: Diagnosis not present

## 2018-02-19 ENCOUNTER — Encounter (HOSPITAL_COMMUNITY): Payer: Self-pay | Admitting: Hematology

## 2018-02-19 ENCOUNTER — Other Ambulatory Visit: Payer: Self-pay

## 2018-02-19 ENCOUNTER — Inpatient Hospital Stay (HOSPITAL_COMMUNITY): Payer: Medicare HMO

## 2018-02-19 ENCOUNTER — Inpatient Hospital Stay (HOSPITAL_COMMUNITY): Payer: Medicare HMO | Attending: Hematology

## 2018-02-19 ENCOUNTER — Encounter (HOSPITAL_COMMUNITY): Payer: Self-pay

## 2018-02-19 ENCOUNTER — Inpatient Hospital Stay (HOSPITAL_BASED_OUTPATIENT_CLINIC_OR_DEPARTMENT_OTHER): Payer: Medicare HMO | Admitting: Hematology

## 2018-02-19 VITALS — BP 131/58 | HR 90 | Temp 97.7°F | Resp 16 | Wt 167.5 lb

## 2018-02-19 DIAGNOSIS — C9000 Multiple myeloma not having achieved remission: Secondary | ICD-10-CM | POA: Diagnosis not present

## 2018-02-19 DIAGNOSIS — Z87891 Personal history of nicotine dependence: Secondary | ICD-10-CM | POA: Insufficient documentation

## 2018-02-19 DIAGNOSIS — Z5112 Encounter for antineoplastic immunotherapy: Secondary | ICD-10-CM | POA: Insufficient documentation

## 2018-02-19 LAB — COMPREHENSIVE METABOLIC PANEL
ALK PHOS: 77 U/L (ref 38–126)
ALT: 6 U/L (ref 0–44)
AST: 11 U/L — AB (ref 15–41)
Albumin: 3.7 g/dL (ref 3.5–5.0)
Anion gap: 9 (ref 5–15)
BUN: 15 mg/dL (ref 8–23)
CALCIUM: 9.6 mg/dL (ref 8.9–10.3)
CHLORIDE: 103 mmol/L (ref 98–111)
CO2: 24 mmol/L (ref 22–32)
CREATININE: 0.76 mg/dL (ref 0.44–1.00)
Glucose, Bld: 124 mg/dL — ABNORMAL HIGH (ref 70–99)
Potassium: 3.8 mmol/L (ref 3.5–5.1)
Sodium: 136 mmol/L (ref 135–145)
Total Bilirubin: 0.3 mg/dL (ref 0.3–1.2)
Total Protein: 7 g/dL (ref 6.5–8.1)

## 2018-02-19 LAB — CBC WITH DIFFERENTIAL/PLATELET
Abs Immature Granulocytes: 0.01 10*3/uL (ref 0.00–0.07)
Basophils Absolute: 0 10*3/uL (ref 0.0–0.1)
Basophils Relative: 0 %
Eosinophils Absolute: 0.1 10*3/uL (ref 0.0–0.5)
Eosinophils Relative: 1 %
HEMATOCRIT: 36.2 % (ref 36.0–46.0)
HEMOGLOBIN: 11.5 g/dL — AB (ref 12.0–15.0)
IMMATURE GRANULOCYTES: 0 %
LYMPHS ABS: 0.7 10*3/uL (ref 0.7–4.0)
LYMPHS PCT: 17 %
MCH: 29.7 pg (ref 26.0–34.0)
MCHC: 31.8 g/dL (ref 30.0–36.0)
MCV: 93.5 fL (ref 80.0–100.0)
Monocytes Absolute: 0.6 10*3/uL (ref 0.1–1.0)
Monocytes Relative: 14 %
NEUTROS ABS: 2.7 10*3/uL (ref 1.7–7.7)
NEUTROS PCT: 68 %
NRBC: 0 % (ref 0.0–0.2)
Platelets: 271 10*3/uL (ref 150–400)
RBC: 3.87 MIL/uL (ref 3.87–5.11)
RDW: 18.4 % — ABNORMAL HIGH (ref 11.5–15.5)
WBC: 4 10*3/uL (ref 4.0–10.5)

## 2018-02-19 MED ORDER — PROCHLORPERAZINE MALEATE 10 MG PO TABS
ORAL_TABLET | ORAL | Status: AC
  Filled 2018-02-19: qty 1

## 2018-02-19 MED ORDER — BORTEZOMIB CHEMO SQ INJECTION 3.5 MG (2.5MG/ML)
1.3000 mg/m2 | Freq: Once | INTRAMUSCULAR | Status: AC
  Administered 2018-02-19: 2.5 mg via SUBCUTANEOUS
  Filled 2018-02-19: qty 1

## 2018-02-19 MED ORDER — PROCHLORPERAZINE MALEATE 10 MG PO TABS: 10.0000 mg | ORAL_TABLET | Freq: Once | ORAL | Status: DC

## 2018-02-19 NOTE — Progress Notes (Signed)
Patient started revlimid today for start of new cycle, has not missed any doses. She also is taking her dexamethasone.  She denies any new symptoms.    Patient reports that she was seen at [facility] last Monday and is awaiting transplant dates.  I advised her to give [place] a call should she hear of a start date.  I provided the phone number to call and she verbalizes understanding.

## 2018-02-19 NOTE — Assessment & Plan Note (Signed)
1.  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 - Again presented to the ER on 10/06/2017 with difficulty walking and tingling on the dorsum of both feet.  MRI of the lumbar spine showed multiple lesions in the thoracic and lumbar vertebrae.  Pathological compression fracture of the T10 vertebral body was seen.  There is an epidural soft tissue mass measuring 9 x 15 x 44 mm at T10 vertebral body level. -T9-T10 laminectomy with resection of epidural tumor T10 bilateral transpedicular decompression/partial corpectomy on 10/06/2017, pathology consistent with plasma cell neoplasm. -Skeletal survey on 10/12/2017 shows lytic lesions in the skull and left femur. - SPEP shows 2.1 g of IgA kappa monoclonal protein.  Free light chain ratio was 1.69.  Normal renal function with a normal calcium level.  Beta-2 microglobulin was 1.4.  LDH was normal. -She underwent a bone marrow aspiration biopsy on 10/12/2017.  This showed 9% plasma cells in the aspirate and 20% CD38 positive cells on core biopsy.  Chromosome analysis shows 62, XY.  FISH panel positive for +11, 13q-/-13. - Completed radiation to the spine, 20 Gy in 10 fractions on 11/10/2017. - She was started on Velcade and dexamethasone on 11/14/2017.  Baseline SPEP shows 2.4 g/dL of M spike.  She has tolerated Velcade very well. -She started her first cycle of Revlimid on 11/21/2017.  She has tolerated cycle 1 very well. - Cycle 2 of VRD on 12/14/2017 and cycle 3 on 01/04/2018. - Cycle 4 of VRD was on 01/25/2018.  She is tolerating it very well. -We reviewed the results of the myeloma work-up from 02/08/2018.  M spike was undetectable.  Free light chain ratio was 1.34.  We will send a immunofixation today. -She met with Dr. Norma Fredrickson on 02/12/2018.  She was felt to be a candidate  for autologous stem cell transplant.  She may proceed with cycle 5 today.  We will reach out to BMT coordinator Urban Gibson for a timeline and recommendations.  2.  Bone strengthening: -She will continue Zometa every 4 weeks.  3.  Hypokalemia: -She will continue potassium 20 mg daily.  Potassium is normal today.

## 2018-02-19 NOTE — Patient Instructions (Signed)
[facility] at [facility] Discharge Instructions     Thank you for choosing [facility] at [facility] to provide your oncology and hematology care.  To afford each patient quality time with our provider, please arrive at least 15 minutes before your scheduled appointment time.   If you have a lab appointment with the Cancer Center please come in thru the  Main Entrance and check in at the main information desk  You need to re-schedule your appointment should you arrive 10 or more minutes late.  We strive to give you quality time with our providers, and arriving late affects you and other patients whose appointments are after yours.  Also, if you no show three or more times for appointments you may be dismissed from the clinic at the providers discretion.     Again, thank you for choosing [facility].  Our hope is that these requests will decrease the amount of time that you wait before being seen by our physicians.       _____________________________________________________________  Should you have questions after your visit to [facility], please contact our office at [phone] between the hours of 8:00 a.m. and 4:30 p.m.  Voicemails left after 4:00 p.m. will not be returned until the following business day.  For prescription refill requests, have your pharmacy contact our office and allow 72 hours.    Cancer Center Support Programs:   > Cancer Support Group  2nd Tuesday of the month 1pm-2pm, Journey Room    

## 2018-02-19 NOTE — Progress Notes (Signed)
Jamie Ewing presents today for injection per MD orders. Velcade administered SQ in right Abdomen. Administration without incident. Patient tolerated well.

## 2018-02-19 NOTE — Progress Notes (Signed)
Jamie Ewing, [place] [id number]   CLINIC:  Medical Oncology/Hematology  PCP:  Asencion Noble, MD [address] [place] [address] [phone]   REASON FOR VISIT: Follow-up for multiple myeloma  CURRENT THERAPY: Revlimid, Velcade, dexamethasone  BRIEF ONCOLOGIC HISTORY:    Multiple myeloma ([place])   10/10/2017 Initial Diagnosis    Multiple myeloma ([place])    11/08/2017 -  Chemotherapy    The patient had bortezomib SQ (VELCADE) chemo injection 2.5 mg, 1.3 mg/m2 = 2.5 mg, Subcutaneous,  Once, 5 of 6 cycles Administration: 2.5 mg (11/14/2017), 2.5 mg (11/21/2017), 2.5 mg (11/28/2017), 2.5 mg (12/14/2017), 2.5 mg (12/21/2017), 2.5 mg (12/28/2017), 2.5 mg (01/04/2018), 2.5 mg (01/11/2018), 2.5 mg (01/18/2018), 2.5 mg (01/25/2018), 2.5 mg (02/01/2018), 2.5 mg (02/08/2018)  for chemotherapy treatment.       INTERVAL HISTORY:  Jamie Ewing 77 y.o. female returns for routine follow-up for multiple myeloma. She is here today and doing well with her treatment. She has occasional dizziness but it resolves at rest. She has numbness in her fingers and toes but it is stable at this time. She denies any new pains. Denies any fevers or recent infections. Denies any bleeding or easy bruising. Denies any nausea, vomiting, or diarrhea. She reports her appetite and energy level at 100% and she has no problem maintaining her weight. She remains active at home. She only wears her back brace now when she is up and moving around the house.     REVIEW OF SYSTEMS:  Review of Systems  Neurological: Positive for dizziness and numbness.  All other systems reviewed and are negative.    PAST MEDICAL/SURGICAL HISTORY:  Past Medical History:  Diagnosis Date  . Back pain   . Hypercholesteremia   . Hypertension    Past Surgical History:  Procedure Laterality Date  . BONE MARROW BIOPSY    . LAMINECTOMY N/A 10/06/2017   Procedure: THORACIC NINE AND TEN LAMINECTOMY WITH  RESECTION OF TUMOR, THORACIC EIGHT TO THORACIC ELEVEN FUSION WITH PEDICLE SCREW FIXATION;  Surgeon: Earnie Larsson, MD;  Location: [facility];  Service: Neurosurgery;  Laterality: N/A;  . TOTAL KNEE ARTHROPLASTY  2001     SOCIAL HISTORY:  Social History   Socioeconomic History  . Marital status: Widowed    Spouse name: Not on file  . Number of children: 2  . Years of education: Not on file  . Highest education level: Not on file  Occupational History  . Not on file  Social Needs  . Financial resource strain: Not very hard  . Food insecurity:    Worry: Never true    Inability: Never true  . Transportation needs:    Medical: No    Non-medical: No  Tobacco Use  . Smoking status: Former Smoker    Packs/day: 0.50    Years: 30.00    Pack years: 15.00    Types: Cigarettes    Last attempt to quit: 05/31/2006    Years since quitting: 11.7  . Smokeless tobacco: Never Used  Substance and Sexual Activity  . Alcohol use: Yes    Comment: wine occassionally  . Drug use: No  . Sexual activity: Not on file  Lifestyle  . Physical activity:    Days per week: 4 days    Minutes per session: 120 min  . Stress: Not at all  Relationships  . Social connections:    Talks on phone: More than three times a week  Gets together: Once a week    Attends religious service: More than 4 times per year    Active member of club or organization: Yes    Attends meetings of clubs or organizations: More than 4 times per year    Relationship status: Widowed  . Intimate partner violence:    Fear of current or ex partner: No    Emotionally abused: No    Physically abused: No    Forced sexual activity: No  Other Topics Concern  . Not on file  Social History Narrative  . Not on file    FAMILY HISTORY:  Family History  Problem Relation Age of Onset  . Tuberculosis Mother   . Kidney failure Father   . Hypertension Paternal Aunt   . Diabetes Paternal Aunt   . Hypertension Paternal Uncle   . Diabetes  Paternal Uncle   . Hypertension Daughter   . Stroke Daughter     CURRENT MEDICATIONS:  Outpatient Encounter Medications as of 02/19/2018  Medication Sig  . acetaminophen (TYLENOL) 650 MG CR tablet Take 1,300 mg by mouth every 8 (eight) hours as needed for pain.  [name] acyclovir (ZOVIRAX) 400 MG tablet Take 1 tablet (400 mg total) by mouth 2 (two) times daily.  [name] amLODipine (NORVASC) 5 MG tablet Take 5 mg by mouth daily.   . Bortezomib (VELCADE IJ) Inject as directed once a week.   [name] dexamethasone (DECADRON) 4 MG tablet Take 10 tablets (40 mg) on days 1, 8, and 15 of chemo. Repeat every 21 days.  . hydrochlorothiazide (HYDRODIURIL) 25 MG tablet Take 25 mg by mouth daily.  [name] lenalidomide (REVLIMID) 25 MG capsule Take one capsule daily on days 1-14 every 21 days.  . ondansetron (ZOFRAN) 4 MG tablet Take 1 tablet (4 mg total) by mouth every 8 (eight) hours as needed for nausea or vomiting. (Patient not taking: Reported on 02/19/2018)  . potassium chloride SA (K-DUR,KLOR-CON) 20 MEQ tablet Take 1 tablet (20 mEq total) by mouth daily.  . prochlorperazine (COMPAZINE) 10 MG tablet Take 1 tablet (10 mg total) by mouth every 6 (six) hours as needed (Nausea or vomiting). (Patient not taking: Reported on 02/19/2018)   No facility-administered encounter medications on file as of 02/19/2018.     ALLERGIES:  No Known Allergies   PHYSICAL EXAM:  ECOG Performance status: 1  Vitals:   02/19/18 1016  BP: (!) 131/58  Pulse: 90  Resp: 16  Temp: 97.7 F (36.5 C)  SpO2: 99%   Filed Weights   02/19/18 1016  Weight: 167 lb 8 oz (76 kg)    Physical Exam  Constitutional: She is oriented to person, place, and time. She appears well-developed and well-nourished.  Cardiovascular: Normal rate, regular rhythm and normal heart sounds.  Pulmonary/Chest: Effort normal and breath sounds normal.  Musculoskeletal: Normal range of motion.  Neurological: She is alert and oriented to person, place, and time.  Skin:  Skin is warm and dry.  Psychiatric: She has a normal mood and affect. Her behavior is normal. Judgment and thought content normal.     LABORATORY DATA:  I have reviewed the labs as listed.  CBC    Component Value Date/Time   WBC 4.0 02/19/2018 0901   RBC 3.87 02/19/2018 0901   HGB 11.5 (L) 02/19/2018 0901   HCT 36.2 02/19/2018 0901   PLT 271 02/19/2018 0901   MCV 93.5 02/19/2018 0901   MCH 29.7 02/19/2018 0901   MCHC 31.8 02/19/2018 0901   RDW  18.4 (H) 02/19/2018 0901   LYMPHSABS 0.7 02/19/2018 0901   MONOABS 0.6 02/19/2018 0901   EOSABS 0.1 02/19/2018 0901   BASOSABS 0.0 02/19/2018 0901   CMP Latest Ref Rng & Units 02/19/2018 02/08/2018 02/01/2018  Glucose 70 - 99 mg/dL 124(H) 172(H) 125(H)  BUN 8 - 23 mg/dL '15 13 16  '$ Creatinine 0.44 - 1.00 mg/dL 0.76 0.80 0.70  Sodium 135 - 145 mmol/L 136 133(L) 134(L)  Potassium 3.5 - 5.1 mmol/L 3.8 3.6 3.8  Chloride 98 - 111 mmol/L 103 101 99  CO2 22 - 32 mmol/L 24 21(L) 23  Calcium 8.9 - 10.3 mg/dL 9.6 8.8(L) 9.8  Total Protein 6.5 - 8.1 g/dL 7.0 7.0 7.4  Total Bilirubin 0.3 - 1.2 mg/dL 0.3 0.6 0.5  Alkaline Phos 38 - 126 U/L 77 93 92  AST 15 - 41 U/L 11(L) 17 17  ALT 0 - 44 U/L '6 12 13       '$ DIAGNOSTIC IMAGING:  *The following radiologic images and reports have been reviewed independently and agree with below findings.      ASSESSMENT & PLAN:   No problem-specific Assessment & Plan notes found for this encounter.      Orders placed this encounter:  No orders of the defined types were placed in this encounter.     Derek Jack, MD [place] [phone]

## 2018-02-23 ENCOUNTER — Other Ambulatory Visit (HOSPITAL_COMMUNITY): Payer: Self-pay | Admitting: Hematology

## 2018-02-23 ENCOUNTER — Encounter (HOSPITAL_COMMUNITY): Payer: Self-pay | Admitting: *Deleted

## 2018-02-23 DIAGNOSIS — C9000 Multiple myeloma not having achieved remission: Secondary | ICD-10-CM

## 2018-02-23 NOTE — Progress Notes (Signed)
Patient to get evaluation testing on Dec 16 @ [facility] for bone marrow transplant. January 6 will be the collection. January 13 will start the transplant.   Patient to get Velcade on Monday Dec 9. This will be the last day of Revlimid, Dex, and Velcade. Patient given these instructions by Wells Guiles the Nurse Coordinator @ [facility]. Wells Guiles also called me and gave me these orders.

## 2018-02-26 ENCOUNTER — Inpatient Hospital Stay (HOSPITAL_COMMUNITY): Payer: Medicare HMO

## 2018-02-26 ENCOUNTER — Other Ambulatory Visit: Payer: Self-pay

## 2018-02-26 ENCOUNTER — Encounter (HOSPITAL_COMMUNITY): Payer: Self-pay

## 2018-02-26 VITALS — BP 134/54 | HR 93 | Temp 98.1°F | Resp 18 | Wt 164.0 lb

## 2018-02-26 DIAGNOSIS — C9000 Multiple myeloma not having achieved remission: Secondary | ICD-10-CM

## 2018-02-26 DIAGNOSIS — Z87891 Personal history of nicotine dependence: Secondary | ICD-10-CM | POA: Diagnosis not present

## 2018-02-26 DIAGNOSIS — Z5112 Encounter for antineoplastic immunotherapy: Secondary | ICD-10-CM | POA: Diagnosis not present

## 2018-02-26 LAB — COMPREHENSIVE METABOLIC PANEL
ALBUMIN: 4 g/dL (ref 3.5–5.0)
ALT: 11 U/L (ref 0–44)
ANION GAP: 11 (ref 5–15)
AST: 14 U/L — ABNORMAL LOW (ref 15–41)
Alkaline Phosphatase: 72 U/L (ref 38–126)
BUN: 11 mg/dL (ref 8–23)
CO2: 24 mmol/L (ref 22–32)
Calcium: 9.2 mg/dL (ref 8.9–10.3)
Chloride: 99 mmol/L (ref 98–111)
Creatinine, Ser: 0.74 mg/dL (ref 0.44–1.00)
GFR calc Af Amer: >60 mL/min (ref >60)
GFR calc non Af Amer: >60 mL/min (ref >60)
GLUCOSE: 142 mg/dL — AB (ref 70–99)
Potassium: 3.4 mmol/L — ABNORMAL LOW (ref 3.5–5.1)
Sodium: 134 mmol/L — ABNORMAL LOW (ref 135–145)
Total Bilirubin: 0.7 mg/dL (ref 0.3–1.2)
Total Protein: 7.1 g/dL (ref 6.5–8.1)

## 2018-02-26 LAB — CBC WITH DIFFERENTIAL/PLATELET
Abs Immature Granulocytes: 0.01 10*3/uL (ref 0.00–0.07)
Basophils Absolute: 0 10*3/uL (ref 0.0–0.1)
Basophils Relative: 0 %
Eosinophils Absolute: 0.2 10*3/uL (ref 0.0–0.5)
Eosinophils Relative: 3 %
HCT: 38.4 % (ref 36.0–46.0)
Hemoglobin: 12.2 g/dL (ref 12.0–15.0)
Immature Granulocytes: 0 %
Lymphocytes Relative: 11 %
Lymphs Abs: 0.6 10*3/uL — ABNORMAL LOW (ref 0.7–4.0)
MCH: 29.5 pg (ref 26.0–34.0)
MCHC: 31.8 g/dL (ref 30.0–36.0)
MCV: 93 fL (ref 80.0–100.0)
Monocytes Absolute: 0.4 10*3/uL (ref 0.1–1.0)
Monocytes Relative: 7 %
Neutro Abs: 4.5 10*3/uL (ref 1.7–7.7)
Neutrophils Relative %: 79 %
Platelets: 247 10*3/uL (ref 150–400)
RBC: 4.13 MIL/uL (ref 3.87–5.11)
RDW: 18 % — ABNORMAL HIGH (ref 11.5–15.5)
WBC: 5.8 10*3/uL (ref 4.0–10.5)
nRBC: 0 % (ref 0.0–0.2)

## 2018-02-26 MED ORDER — BORTEZOMIB CHEMO SQ INJECTION 3.5 MG (2.5MG/ML)
1.3000 mg/m2 | Freq: Once | INTRAMUSCULAR | Status: AC
  Administered 2018-02-26: 2.5 mg via SUBCUTANEOUS
  Filled 2018-02-26: qty 1

## 2018-02-26 MED ORDER — ZOLEDRONIC ACID 4 MG/100ML IV SOLN
4.0000 mg | Freq: Once | INTRAVENOUS | Status: AC
  Administered 2018-02-26: 4 mg via INTRAVENOUS
  Filled 2018-02-26: qty 100

## 2018-02-26 MED ORDER — PROCHLORPERAZINE MALEATE 10 MG PO TABS: 10.0000 mg | ORAL_TABLET | Freq: Once | ORAL | Status: DC

## 2018-02-26 MED ORDER — SODIUM CHLORIDE 0.9 % IV SOLN
INTRAVENOUS | Status: DC
  Administered 2018-02-26: 12:00:00 via INTRAVENOUS

## 2018-02-26 NOTE — Progress Notes (Signed)
Jamie Ewing presents today for injection per the provider's orders.  Velcade administration without incident; see MAR for injection details.  Tolerated Zometa infusion w/o incident. Patient tolerated procedure well and without incident.  Instructed pt to stop all medications r/t her cancer tx (dex, Revlimid).  No questions or complaints noted at this time.  Discharged ambulatory.

## 2018-02-28 ENCOUNTER — Other Ambulatory Visit (HOSPITAL_COMMUNITY): Payer: Self-pay | Admitting: Nurse Practitioner

## 2018-02-28 DIAGNOSIS — C9000 Multiple myeloma not having achieved remission: Secondary | ICD-10-CM

## 2018-02-28 MED ORDER — LENALIDOMIDE 25 MG PO CAPS: ORAL_CAPSULE | ORAL | 0 refills | Status: DC

## 2018-03-05 ENCOUNTER — Other Ambulatory Visit (HOSPITAL_COMMUNITY): Payer: Medicare HMO

## 2018-03-05 ENCOUNTER — Other Ambulatory Visit (HOSPITAL_COMMUNITY): Payer: Self-pay | Admitting: Nurse Practitioner

## 2018-03-05 ENCOUNTER — Ambulatory Visit (HOSPITAL_COMMUNITY): Payer: Medicare HMO

## 2018-03-05 DIAGNOSIS — E876 Hypokalemia: Secondary | ICD-10-CM

## 2018-03-07 DIAGNOSIS — C9001 Multiple myeloma in remission: Secondary | ICD-10-CM | POA: Diagnosis not present

## 2018-03-07 DIAGNOSIS — Z01811 Encounter for preprocedural respiratory examination: Secondary | ICD-10-CM | POA: Diagnosis not present

## 2018-03-07 DIAGNOSIS — M8448XD Pathological fracture, other site, subsequent encounter for fracture with routine healing: Secondary | ICD-10-CM | POA: Diagnosis not present

## 2018-03-07 DIAGNOSIS — C9000 Multiple myeloma not having achieved remission: Secondary | ICD-10-CM | POA: Diagnosis not present

## 2018-03-07 DIAGNOSIS — Z01818 Encounter for other preprocedural examination: Secondary | ICD-10-CM | POA: Diagnosis not present

## 2018-03-07 DIAGNOSIS — Z01810 Encounter for preprocedural cardiovascular examination: Secondary | ICD-10-CM | POA: Diagnosis not present

## 2018-03-07 DIAGNOSIS — I517 Cardiomegaly: Secondary | ICD-10-CM | POA: Diagnosis not present

## 2018-03-07 DIAGNOSIS — Z79899 Other long term (current) drug therapy: Secondary | ICD-10-CM | POA: Diagnosis not present

## 2018-03-12 ENCOUNTER — Inpatient Hospital Stay (HOSPITAL_BASED_OUTPATIENT_CLINIC_OR_DEPARTMENT_OTHER): Payer: Medicare HMO | Admitting: Hematology

## 2018-03-12 ENCOUNTER — Other Ambulatory Visit (HOSPITAL_COMMUNITY): Payer: Medicare HMO

## 2018-03-12 ENCOUNTER — Encounter (HOSPITAL_COMMUNITY): Payer: Self-pay | Admitting: Hematology

## 2018-03-12 ENCOUNTER — Ambulatory Visit (HOSPITAL_COMMUNITY): Payer: Medicare HMO

## 2018-03-12 VITALS — BP 135/67 | HR 86 | Temp 98.4°F | Resp 16 | Wt 166.6 lb

## 2018-03-12 DIAGNOSIS — E876 Hypokalemia: Secondary | ICD-10-CM

## 2018-03-12 DIAGNOSIS — C9000 Multiple myeloma not having achieved remission: Secondary | ICD-10-CM | POA: Diagnosis not present

## 2018-03-12 DIAGNOSIS — Z87891 Personal history of nicotine dependence: Secondary | ICD-10-CM | POA: Diagnosis not present

## 2018-03-12 DIAGNOSIS — Z5112 Encounter for antineoplastic immunotherapy: Secondary | ICD-10-CM | POA: Diagnosis not present

## 2018-03-12 NOTE — Assessment & Plan Note (Addendum)
1.  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 - Again presented to the ER on 10/06/2017 with difficulty walking and tingling on the dorsum of both feet.  MRI of the lumbar spine showed multiple lesions in the thoracic and lumbar vertebrae.  Pathological compression fracture of the T10 vertebral body was seen.  There is an epidural soft tissue mass measuring 9 x 15 x 44 mm at T10 vertebral body level. -T9-T10 laminectomy with resection of epidural tumor T10 bilateral transpedicular decompression/partial corpectomy on 10/06/2017, pathology consistent with plasma cell neoplasm. -Skeletal survey on 10/12/2017 shows lytic lesions in the skull and left femur. - SPEP shows 2.1 g of IgA kappa monoclonal protein.  Free light chain ratio was 1.69.  Normal renal function with a normal calcium level.  Beta-2 microglobulin was 1.4.  LDH was normal. -She underwent a bone marrow aspiration biopsy on 10/12/2017.  This showed 9% plasma cells in the aspirate and 20% CD38 positive cells on core biopsy.  Chromosome analysis shows 61, XY.  FISH panel positive for +11, 13q-/-13. - Completed radiation to the spine, 20 Gy in 10 fractions on 11/10/2017. - She was started on Velcade and dexamethasone on 11/14/2017.  Baseline SPEP shows 2.4 g/dL of M spike.  She has tolerated Velcade very well. -She started her first cycle of Revlimid on 11/21/2017.  She has tolerated cycle 1 very well. - Cycle 2 of VRD on 12/14/2017 and cycle 3 on 01/04/2018. - Cycle 4 of VRD was on 01/25/2018.  - She met with Dr. Norma Fredrickson on 02/12/2018. - She received cycle 5-day 1 on 02/19/2018.  She received day 8 on 02/26/2018.  We will hold her further treatments. -We reviewed results of SPEP from 02/08/2018 which did not show M spike.  Light chain ratio was normal. -She was  evaluated at [facility] last Wednesday and a bone marrow biopsy was done. -PET scan on 03/07/2018 at [facility] did not reveal any FDG avid masses or lytic lesions. -She will have stem cell collection on 03/26/2018 and transplant on 04/02/2018. -I will see her back after the bone marrow transplant.  2.  Bone strengthening: -Last Zometa was on 02/26/2018.  Will be on hold until after transplant.  3.  Hypokalemia: -She will continue potassium 20 mEq daily.

## 2018-03-12 NOTE — Progress Notes (Signed)
[place], [place] [address]   CLINIC:  Medical Oncology/Hematology  PCP:  Asencion Noble, MD [address] [place] [address] [phone]   REASON FOR VISIT: Follow-up for multiple myeloma  CURRENT THERAPY: Revlimid, Velcade, dexamethasone  BRIEF ONCOLOGIC HISTORY:    Multiple myeloma ([place])   10/10/2017 Initial Diagnosis    Multiple myeloma ([place])    11/08/2017 -  Chemotherapy    The patient had bortezomib SQ (VELCADE) chemo injection 2.5 mg, 1.3 mg/m2 = 2.5 mg, Subcutaneous,  Once, 5 of 6 cycles Administration: 2.5 mg (11/14/2017), 2.5 mg (11/21/2017), 2.5 mg (11/28/2017), 2.5 mg (12/14/2017), 2.5 mg (12/21/2017), 2.5 mg (12/28/2017), 2.5 mg (01/04/2018), 2.5 mg (01/11/2018), 2.5 mg (01/18/2018), 2.5 mg (01/25/2018), 2.5 mg (02/01/2018), 2.5 mg (02/08/2018), 2.5 mg (02/19/2018), 2.5 mg (02/26/2018)  for chemotherapy treatment.       INTERVAL HISTORY:  Jamie Ewing 77 y.o. female returns for routine follow-up for multiple myeloma. She is here today with her granddaughter. She is no longer wearing her brace. She still uses her walker when she goes out of the house. Her legs are still weak and her feet are slightly numb. She was cleared to drive and is doing better. Otherwise she is doing well and will be undergoing her bone marrow transplant in January. Denies any nausea, vomiting, or diarrhea. Denies any new pains. Had not noticed any recent bleeding such as epistaxis, hematuria or hematochezia. Denies recent chest pain on exertion, shortness of breath on minimal exertion, pre-syncopal episodes, or palpitations. Denies any recent fevers, infections, or recent hospitalizations. She reports her appetite and energy level at 75%.     REVIEW OF SYSTEMS:  Review of Systems  Gastrointestinal: Positive for constipation.  Neurological: Positive for numbness.  All other systems reviewed and are negative.    PAST MEDICAL/SURGICAL HISTORY:  Past  Medical History:  Diagnosis Date  . Back pain   . Hypercholesteremia   . Hypertension    Past Surgical History:  Procedure Laterality Date  . BONE MARROW BIOPSY    . LAMINECTOMY N/A 10/06/2017   Procedure: THORACIC NINE AND TEN LAMINECTOMY WITH RESECTION OF TUMOR, THORACIC EIGHT TO THORACIC ELEVEN FUSION WITH PEDICLE SCREW FIXATION;  Surgeon: Earnie Larsson, MD;  Location: [facility];  Service: Neurosurgery;  Laterality: N/A;  . TOTAL KNEE ARTHROPLASTY  2001     SOCIAL HISTORY:  Social History   Socioeconomic History  . Marital status: Widowed    Spouse name: Not on file  . Number of children: 2  . Years of education: Not on file  . Highest education level: Not on file  Occupational History  . Not on file  Social Needs  . Financial resource strain: Not very hard  . Food insecurity:    Worry: Never true    Inability: Never true  . Transportation needs:    Medical: No    Non-medical: No  Tobacco Use  . Smoking status: Former Smoker    Packs/day: 0.50    Years: 30.00    Pack years: 15.00    Types: Cigarettes    Last attempt to quit: 05/31/2006    Years since quitting: 11.7  . Smokeless tobacco: Never Used  Substance and Sexual Activity  . Alcohol use: Yes    Comment: wine occassionally  . Drug use: No  . Sexual activity: Not on file  Lifestyle  . Physical activity:    Days per week: 4 days    Minutes per  session: 120 min  . Stress: Not at all  Relationships  . Social connections:    Talks on phone: More than three times a week    Gets together: Once a week    Attends religious service: More than 4 times per year    Active member of club or organization: Yes    Attends meetings of clubs or organizations: More than 4 times per year    Relationship status: Widowed  . Intimate partner violence:    Fear of current or ex partner: No    Emotionally abused: No    Physically abused: No    Forced sexual activity: No  Other Topics Concern  . Not on file  Social History  Narrative  . Not on file    FAMILY HISTORY:  Family History  Problem Relation Age of Onset  . Tuberculosis Mother   . Kidney failure Father   . Hypertension Paternal Aunt   . Diabetes Paternal Aunt   . Hypertension Paternal Uncle   . Diabetes Paternal Uncle   . Hypertension Daughter   . Stroke Daughter     CURRENT MEDICATIONS:  Outpatient Encounter Medications as of 03/12/2018  Medication Sig  . acetaminophen (TYLENOL) 650 MG CR tablet Take 1,300 mg by mouth every 8 (eight) hours as needed for pain.  [name] acyclovir (ZOVIRAX) 400 MG tablet Take 1 tablet (400 mg total) by mouth 2 (two) times daily.  [name] amLODipine (NORVASC) 5 MG tablet Take 5 mg by mouth daily.   . ASPIRIN 81 PO Take 1 tablet by mouth daily.  [name] CALCIUM-VITAMIN D PO Take 1 tablet by mouth daily.  . hydrochlorothiazide (HYDRODIURIL) 25 MG tablet Take 25 mg by mouth daily.  [name] KLOR-CON M20 20 MEQ tablet TAKE 1 TABLET BY MOUTH EVERY DAY  . [DISCONTINUED] Bortezomib (VELCADE IJ) Inject as directed once a week.   . [DISCONTINUED] dexamethasone (DECADRON) 4 MG tablet Take 10 tablets (40 mg) on days 1, 8, and 15 of chemo. Repeat every 21 days.  . [DISCONTINUED] lenalidomide (REVLIMID) 25 MG capsule Take one capsule daily on days 1-14 every 21 days.  . [DISCONTINUED] ondansetron (ZOFRAN) 4 MG tablet Take 1 tablet (4 mg total) by mouth every 8 (eight) hours as needed for nausea or vomiting. (Patient not taking: Reported on 02/19/2018)  . [DISCONTINUED] prochlorperazine (COMPAZINE) 10 MG tablet Take 1 tablet (10 mg total) by mouth every 6 (six) hours as needed (Nausea or vomiting). (Patient not taking: Reported on 02/19/2018)   No facility-administered encounter medications on file as of 03/12/2018.     ALLERGIES:  No Known Allergies   PHYSICAL EXAM:  ECOG Performance status: 1  Vitals:   03/12/18 1048  BP: 135/67  Pulse: 86  Resp: 16  Temp: 98.4 F (36.9 C)  SpO2: 100%   Filed Weights   03/12/18 1048  Weight: 166 lb  9.6 oz (75.6 kg)    Physical Exam Constitutional:      Appearance: Normal appearance. She is normal weight.  Musculoskeletal: Normal range of motion.  Skin:    General: Skin is warm and dry.  Neurological:     Mental Status: She is alert and oriented to person, place, and time. Mental status is at baseline.  Psychiatric:        Mood and Affect: Mood normal.        Behavior: Behavior normal.        Thought Content: Thought content normal.        Judgment: Judgment  normal.      LABORATORY DATA:  I have reviewed the labs as listed.  CBC    Component Value Date/Time   WBC 5.8 02/26/2018 1037   RBC 4.13 02/26/2018 1037   HGB 12.2 02/26/2018 1037   HCT 38.4 02/26/2018 1037   PLT 247 02/26/2018 1037   MCV 93.0 02/26/2018 1037   MCH 29.5 02/26/2018 1037   MCHC 31.8 02/26/2018 1037   RDW 18.0 (H) 02/26/2018 1037   LYMPHSABS 0.6 (L) 02/26/2018 1037   MONOABS 0.4 02/26/2018 1037   EOSABS 0.2 02/26/2018 1037   BASOSABS 0.0 02/26/2018 1037   CMP Latest Ref Rng & Units 02/26/2018 02/19/2018 02/08/2018  Glucose 70 - 99 mg/dL 142(H) 124(H) 172(H)  BUN 8 - 23 mg/dL _0 Creatinine 0.44 - 1.00 mg/dL 0.74 0.76 0.80  Sodium 135 - 145 mmol/L 134(L) 136 133(L)  Potassium 3.5 - 5.1 mmol/L 3.4(L) 3.8 3.6  Chloride 98 - 111 mmol/L 99 103 101  CO2 22 - 32 mmol/L 24 24 21(L)  Calcium 8.9 - 10.3 mg/dL 9.2 9.6 8.8(L)  Total Protein 6.5 - 8.1 g/dL 7.1 7.0 7.0  Total Bilirubin 0.3 - 1.2 mg/dL 0.7 0.3 0.6  Alkaline Phos 38 - 126 U/L 72 77 93  AST 15 - 41 U/L 14(L) 11(L) 17  ALT 0 - 44 U/L _1 DIAGNOSTIC IMAGING:  I have independently reviewed the scans and discussed with the patient.   I have reviewed Francene Finders, NP's note and agree with the documentation.  I personally performed a face-to-face visit, made revisions and my assessment and plan is as follows.    ASSESSMENT & PLAN:   Multiple myeloma ([facility]) 1.  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 -Presented to the emergency room on 09/29/2017 with back pain, normal x-ray of the lumbar spine. - Again presented to the ER on 10/06/2017 with difficulty walking and tingling on the dorsum of both feet.  MRI of the lumbar spine showed multiple lesions in the thoracic and lumbar vertebrae.  Pathological compression fracture of the T10 vertebral body was seen.  There is an epidural soft tissue mass measuring 9 x 15 x 44 mm at T10 vertebral body level. -T9-T10 laminectomy with resection of epidural tumor T10 bilateral transpedicular decompression/partial corpectomy on 10/06/2017, pathology consistent with plasma cell neoplasm. -Skeletal survey on 10/12/2017 shows lytic lesions in the skull and left femur. - SPEP shows 2.1 g of IgA kappa monoclonal protein.  Free light chain ratio was 1.69.  Normal renal function with a normal calcium level.  Beta-2 microglobulin was 1.4.  LDH was normal. -She underwent a bone marrow aspiration biopsy on 10/12/2017.  This showed 9% plasma cells in the aspirate and 20% CD38 positive cells on core biopsy.  Chromosome analysis shows 60, XY.  FISH panel positive for +11, 13q-/-13. - Completed radiation to the spine, 20 Gy in 10 fractions on 11/10/2017. - She was started on Velcade and dexamethasone on 11/14/2017.  Baseline SPEP shows 2.4 g/dL of M spike.  She has tolerated Velcade very well. -She started her first cycle of Revlimid on 11/21/2017.  She has tolerated cycle 1 very well. - Cycle 2 of VRD on 12/14/2017 and cycle 3 on 01/04/2018. - Cycle 4 of VRD was on 01/25/2018.  - She met with Dr. Norma Fredrickson on 02/12/2018. - She received  cycle 5-day 1 on 02/19/2018.  She received day 8 on 02/26/2018.  We will hold her further treatments. -We reviewed results of SPEP from 02/08/2018 which did not show M spike.  Light chain ratio was normal. -She was evaluated at [facility] last Wednesday and a bone marrow  biopsy was done. -PET scan on 03/07/2018 at [facility] did not reveal any FDG avid masses or lytic lesions. -She will have stem cell collection on 03/26/2018 and transplant on 04/02/2018. -I will see her back after the bone marrow transplant.  2.  Bone strengthening: -Last Zometa was on 02/26/2018.  Will be on hold until after transplant.  3.  Hypokalemia: -She will continue potassium 20 mEq daily.      Orders placed this encounter:  Orders Placed This Encounter  Procedures  . IgG, IgA, IgM  . Lactate dehydrogenase  . Protein electrophoresis, serum  . Immunofixation electrophoresis  . Kappa/lambda light chains  . CBC with Differential/Platelet  . Comprehensive metabolic panel      Derek Jack, MD [place] [phone]

## 2018-03-12 NOTE — Patient Instructions (Addendum)
[facility] Discharge Instructions  Follow up in 3 months with labs    Thank you for choosing [facility] at [facility] to provide your oncology and hematology care.  To afford each patient quality time with our provider, please arrive at least 15 minutes before your scheduled appointment time.   If you have a lab appointment with the Cancer Center please come in thru the  Main Entrance and check in at the main information desk  You need to re-schedule your appointment should you arrive 10 or more minutes late.  We strive to give you quality time with our providers, and arriving late affects you and other patients whose appointments are after yours.  Also, if you no show three or more times for appointments you may be dismissed from the clinic at the providers discretion.     Again, thank you for choosing [facility].  Our hope is that these requests will decrease the amount of time that you wait before being seen by our physicians.       _____________________________________________________________  Should you have questions after your visit to [facility], please contact our office at [phone] between the hours of 8:00 a.m. and 4:30 p.m.  Voicemails left after 4:00 p.m. will not be returned until the following business day.  For prescription refill requests, have your pharmacy contact our office and allow 72 hours.    Cancer Center Support Programs:   > Cancer Support Group  2nd Tuesday of the month 1pm-2pm, Journey Room    

## 2018-03-16 ENCOUNTER — Telehealth (HOSPITAL_COMMUNITY): Payer: Self-pay | Admitting: Emergency Medicine

## 2018-03-16 NOTE — Telephone Encounter (Signed)
[facility] back to let them know that Revlimid would be on hold.  Pt id going to transplant, explained we would send a new prescription when ready.

## 2018-03-19 ENCOUNTER — Ambulatory Visit (HOSPITAL_COMMUNITY): Payer: Medicare HMO

## 2018-03-19 ENCOUNTER — Other Ambulatory Visit (HOSPITAL_COMMUNITY): Payer: Medicare HMO

## 2018-03-19 DIAGNOSIS — C9000 Multiple myeloma not having achieved remission: Secondary | ICD-10-CM | POA: Diagnosis not present

## 2018-03-19 DIAGNOSIS — Z52011 Autologous donor, stem cells: Secondary | ICD-10-CM | POA: Diagnosis not present

## 2018-03-19 DIAGNOSIS — C9001 Multiple myeloma in remission: Secondary | ICD-10-CM | POA: Diagnosis not present

## 2018-03-21 DIAGNOSIS — Z949 Transplanted organ and tissue status, unspecified: Secondary | ICD-10-CM | POA: Diagnosis not present

## 2018-03-22 DIAGNOSIS — C9000 Multiple myeloma not having achieved remission: Secondary | ICD-10-CM | POA: Diagnosis not present

## 2018-03-22 DIAGNOSIS — Z52011 Autologous donor, stem cells: Secondary | ICD-10-CM | POA: Diagnosis not present

## 2018-03-23 DIAGNOSIS — C9000 Multiple myeloma not having achieved remission: Secondary | ICD-10-CM | POA: Diagnosis not present

## 2018-03-23 DIAGNOSIS — Z52011 Autologous donor, stem cells: Secondary | ICD-10-CM | POA: Diagnosis not present

## 2018-03-24 DIAGNOSIS — C9000 Multiple myeloma not having achieved remission: Secondary | ICD-10-CM | POA: Diagnosis not present

## 2018-03-24 DIAGNOSIS — Z52011 Autologous donor, stem cells: Secondary | ICD-10-CM | POA: Diagnosis not present

## 2018-03-25 DIAGNOSIS — C9000 Multiple myeloma not having achieved remission: Secondary | ICD-10-CM | POA: Diagnosis not present

## 2018-03-25 DIAGNOSIS — Z52011 Autologous donor, stem cells: Secondary | ICD-10-CM | POA: Diagnosis not present

## 2018-03-26 ENCOUNTER — Other Ambulatory Visit (HOSPITAL_COMMUNITY): Payer: Medicare HMO

## 2018-03-26 ENCOUNTER — Ambulatory Visit (HOSPITAL_COMMUNITY): Payer: Medicare HMO

## 2018-03-26 DIAGNOSIS — C9000 Multiple myeloma not having achieved remission: Secondary | ICD-10-CM | POA: Diagnosis not present

## 2018-03-26 DIAGNOSIS — Z52011 Autologous donor, stem cells: Secondary | ICD-10-CM | POA: Diagnosis not present

## 2018-03-26 DIAGNOSIS — Z95828 Presence of other vascular implants and grafts: Secondary | ICD-10-CM | POA: Diagnosis not present

## 2018-03-27 DIAGNOSIS — Z52011 Autologous donor, stem cells: Secondary | ICD-10-CM | POA: Diagnosis not present

## 2018-03-27 DIAGNOSIS — C9000 Multiple myeloma not having achieved remission: Secondary | ICD-10-CM | POA: Diagnosis not present

## 2018-03-28 ENCOUNTER — Other Ambulatory Visit (HOSPITAL_COMMUNITY): Payer: Self-pay | Admitting: Hematology

## 2018-03-30 DIAGNOSIS — C9000 Multiple myeloma not having achieved remission: Secondary | ICD-10-CM | POA: Diagnosis not present

## 2018-04-02 DIAGNOSIS — G8929 Other chronic pain: Secondary | ICD-10-CM | POA: Diagnosis not present

## 2018-04-02 DIAGNOSIS — K5909 Other constipation: Secondary | ICD-10-CM | POA: Diagnosis not present

## 2018-04-02 DIAGNOSIS — Z79899 Other long term (current) drug therapy: Secondary | ICD-10-CM | POA: Diagnosis not present

## 2018-04-02 DIAGNOSIS — E876 Hypokalemia: Secondary | ICD-10-CM | POA: Diagnosis not present

## 2018-04-02 DIAGNOSIS — C9000 Multiple myeloma not having achieved remission: Secondary | ICD-10-CM | POA: Diagnosis not present

## 2018-04-02 DIAGNOSIS — C9001 Multiple myeloma in remission: Secondary | ICD-10-CM | POA: Diagnosis not present

## 2018-04-02 DIAGNOSIS — I10 Essential (primary) hypertension: Secondary | ICD-10-CM | POA: Diagnosis not present

## 2018-04-02 DIAGNOSIS — K5900 Constipation, unspecified: Secondary | ICD-10-CM | POA: Diagnosis not present

## 2018-04-03 DIAGNOSIS — Z79899 Other long term (current) drug therapy: Secondary | ICD-10-CM | POA: Diagnosis not present

## 2018-04-03 DIAGNOSIS — Z792 Long term (current) use of antibiotics: Secondary | ICD-10-CM | POA: Diagnosis not present

## 2018-04-03 DIAGNOSIS — C9002 Multiple myeloma in relapse: Secondary | ICD-10-CM | POA: Diagnosis not present

## 2018-04-03 DIAGNOSIS — C9001 Multiple myeloma in remission: Secondary | ICD-10-CM | POA: Diagnosis not present

## 2018-04-04 DIAGNOSIS — Z79899 Other long term (current) drug therapy: Secondary | ICD-10-CM | POA: Diagnosis not present

## 2018-04-04 DIAGNOSIS — D61810 Antineoplastic chemotherapy induced pancytopenia: Secondary | ICD-10-CM | POA: Diagnosis not present

## 2018-04-04 DIAGNOSIS — C9001 Multiple myeloma in remission: Secondary | ICD-10-CM | POA: Diagnosis not present

## 2018-04-04 DIAGNOSIS — Z792 Long term (current) use of antibiotics: Secondary | ICD-10-CM | POA: Diagnosis not present

## 2018-04-05 DIAGNOSIS — K5909 Other constipation: Secondary | ICD-10-CM | POA: Diagnosis not present

## 2018-04-05 DIAGNOSIS — R42 Dizziness and giddiness: Secondary | ICD-10-CM | POA: Diagnosis not present

## 2018-04-05 DIAGNOSIS — D61810 Antineoplastic chemotherapy induced pancytopenia: Secondary | ICD-10-CM | POA: Diagnosis not present

## 2018-04-05 DIAGNOSIS — I10 Essential (primary) hypertension: Secondary | ICD-10-CM | POA: Diagnosis not present

## 2018-04-05 DIAGNOSIS — Z792 Long term (current) use of antibiotics: Secondary | ICD-10-CM | POA: Diagnosis not present

## 2018-04-05 DIAGNOSIS — G8929 Other chronic pain: Secondary | ICD-10-CM | POA: Diagnosis not present

## 2018-04-05 DIAGNOSIS — E876 Hypokalemia: Secondary | ICD-10-CM | POA: Diagnosis not present

## 2018-04-05 DIAGNOSIS — R110 Nausea: Secondary | ICD-10-CM | POA: Diagnosis not present

## 2018-04-05 DIAGNOSIS — Z9484 Stem cells transplant status: Secondary | ICD-10-CM | POA: Diagnosis not present

## 2018-04-05 DIAGNOSIS — C9001 Multiple myeloma in remission: Secondary | ICD-10-CM | POA: Diagnosis not present

## 2018-04-05 DIAGNOSIS — R5383 Other fatigue: Secondary | ICD-10-CM | POA: Diagnosis not present

## 2018-04-06 DIAGNOSIS — R5383 Other fatigue: Secondary | ICD-10-CM | POA: Diagnosis not present

## 2018-04-06 DIAGNOSIS — R110 Nausea: Secondary | ICD-10-CM | POA: Diagnosis not present

## 2018-04-06 DIAGNOSIS — D61810 Antineoplastic chemotherapy induced pancytopenia: Secondary | ICD-10-CM | POA: Diagnosis not present

## 2018-04-06 DIAGNOSIS — R42 Dizziness and giddiness: Secondary | ICD-10-CM | POA: Diagnosis not present

## 2018-04-06 DIAGNOSIS — R630 Anorexia: Secondary | ICD-10-CM | POA: Diagnosis not present

## 2018-04-06 DIAGNOSIS — C9001 Multiple myeloma in remission: Secondary | ICD-10-CM | POA: Diagnosis not present

## 2018-04-07 DIAGNOSIS — C9001 Multiple myeloma in remission: Secondary | ICD-10-CM | POA: Diagnosis not present

## 2018-04-07 DIAGNOSIS — G8929 Other chronic pain: Secondary | ICD-10-CM | POA: Diagnosis not present

## 2018-04-07 DIAGNOSIS — F17210 Nicotine dependence, cigarettes, uncomplicated: Secondary | ICD-10-CM | POA: Diagnosis not present

## 2018-04-07 DIAGNOSIS — Z7982 Long term (current) use of aspirin: Secondary | ICD-10-CM | POA: Diagnosis not present

## 2018-04-07 DIAGNOSIS — Z79899 Other long term (current) drug therapy: Secondary | ICD-10-CM | POA: Diagnosis not present

## 2018-04-07 DIAGNOSIS — I10 Essential (primary) hypertension: Secondary | ICD-10-CM | POA: Diagnosis not present

## 2018-04-07 DIAGNOSIS — R42 Dizziness and giddiness: Secondary | ICD-10-CM | POA: Diagnosis not present

## 2018-04-07 DIAGNOSIS — Z9484 Stem cells transplant status: Secondary | ICD-10-CM | POA: Diagnosis not present

## 2018-04-07 DIAGNOSIS — R942 Abnormal results of pulmonary function studies: Secondary | ICD-10-CM | POA: Diagnosis not present

## 2018-04-07 DIAGNOSIS — K5900 Constipation, unspecified: Secondary | ICD-10-CM | POA: Diagnosis not present

## 2018-04-08 DIAGNOSIS — K5900 Constipation, unspecified: Secondary | ICD-10-CM | POA: Diagnosis not present

## 2018-04-08 DIAGNOSIS — R942 Abnormal results of pulmonary function studies: Secondary | ICD-10-CM | POA: Diagnosis not present

## 2018-04-08 DIAGNOSIS — Z5189 Encounter for other specified aftercare: Secondary | ICD-10-CM | POA: Diagnosis not present

## 2018-04-08 DIAGNOSIS — Z792 Long term (current) use of antibiotics: Secondary | ICD-10-CM | POA: Diagnosis not present

## 2018-04-08 DIAGNOSIS — I10 Essential (primary) hypertension: Secondary | ICD-10-CM | POA: Diagnosis not present

## 2018-04-08 DIAGNOSIS — C9001 Multiple myeloma in remission: Secondary | ICD-10-CM | POA: Diagnosis not present

## 2018-04-08 DIAGNOSIS — D61810 Antineoplastic chemotherapy induced pancytopenia: Secondary | ICD-10-CM | POA: Diagnosis not present

## 2018-04-08 DIAGNOSIS — Z79899 Other long term (current) drug therapy: Secondary | ICD-10-CM | POA: Diagnosis not present

## 2018-04-08 DIAGNOSIS — K5909 Other constipation: Secondary | ICD-10-CM | POA: Diagnosis not present

## 2018-04-08 DIAGNOSIS — G8929 Other chronic pain: Secondary | ICD-10-CM | POA: Diagnosis not present

## 2018-04-08 DIAGNOSIS — R42 Dizziness and giddiness: Secondary | ICD-10-CM | POA: Diagnosis not present

## 2018-04-08 DIAGNOSIS — C9000 Multiple myeloma not having achieved remission: Secondary | ICD-10-CM | POA: Diagnosis not present

## 2018-04-09 DIAGNOSIS — C9001 Multiple myeloma in remission: Secondary | ICD-10-CM | POA: Diagnosis not present

## 2018-04-09 DIAGNOSIS — R112 Nausea with vomiting, unspecified: Secondary | ICD-10-CM | POA: Diagnosis not present

## 2018-04-09 DIAGNOSIS — D61810 Antineoplastic chemotherapy induced pancytopenia: Secondary | ICD-10-CM | POA: Diagnosis not present

## 2018-04-09 DIAGNOSIS — Z79899 Other long term (current) drug therapy: Secondary | ICD-10-CM | POA: Diagnosis not present

## 2018-04-09 DIAGNOSIS — Z792 Long term (current) use of antibiotics: Secondary | ICD-10-CM | POA: Diagnosis not present

## 2018-04-10 DIAGNOSIS — K5909 Other constipation: Secondary | ICD-10-CM | POA: Diagnosis not present

## 2018-04-10 DIAGNOSIS — R42 Dizziness and giddiness: Secondary | ICD-10-CM | POA: Diagnosis not present

## 2018-04-10 DIAGNOSIS — G8929 Other chronic pain: Secondary | ICD-10-CM | POA: Diagnosis not present

## 2018-04-10 DIAGNOSIS — C9001 Multiple myeloma in remission: Secondary | ICD-10-CM | POA: Diagnosis not present

## 2018-04-10 DIAGNOSIS — Z792 Long term (current) use of antibiotics: Secondary | ICD-10-CM | POA: Diagnosis not present

## 2018-04-10 DIAGNOSIS — K5900 Constipation, unspecified: Secondary | ICD-10-CM | POA: Diagnosis not present

## 2018-04-10 DIAGNOSIS — R112 Nausea with vomiting, unspecified: Secondary | ICD-10-CM | POA: Diagnosis not present

## 2018-04-10 DIAGNOSIS — E876 Hypokalemia: Secondary | ICD-10-CM | POA: Diagnosis not present

## 2018-04-10 DIAGNOSIS — Z9484 Stem cells transplant status: Secondary | ICD-10-CM | POA: Diagnosis not present

## 2018-04-10 DIAGNOSIS — I10 Essential (primary) hypertension: Secondary | ICD-10-CM | POA: Diagnosis not present

## 2018-04-10 DIAGNOSIS — C9000 Multiple myeloma not having achieved remission: Secondary | ICD-10-CM | POA: Diagnosis not present

## 2018-04-10 DIAGNOSIS — Z79899 Other long term (current) drug therapy: Secondary | ICD-10-CM | POA: Diagnosis not present

## 2018-04-11 DIAGNOSIS — C9001 Multiple myeloma in remission: Secondary | ICD-10-CM | POA: Diagnosis not present

## 2018-04-11 DIAGNOSIS — D61818 Other pancytopenia: Secondary | ICD-10-CM | POA: Diagnosis not present

## 2018-04-11 DIAGNOSIS — Z792 Long term (current) use of antibiotics: Secondary | ICD-10-CM | POA: Diagnosis not present

## 2018-04-11 DIAGNOSIS — Z79899 Other long term (current) drug therapy: Secondary | ICD-10-CM | POA: Diagnosis not present

## 2018-04-11 DIAGNOSIS — D61810 Antineoplastic chemotherapy induced pancytopenia: Secondary | ICD-10-CM | POA: Diagnosis not present

## 2018-04-12 DIAGNOSIS — K5909 Other constipation: Secondary | ICD-10-CM | POA: Diagnosis not present

## 2018-04-12 DIAGNOSIS — R5383 Other fatigue: Secondary | ICD-10-CM | POA: Diagnosis not present

## 2018-04-12 DIAGNOSIS — C9000 Multiple myeloma not having achieved remission: Secondary | ICD-10-CM | POA: Diagnosis not present

## 2018-04-12 DIAGNOSIS — G629 Polyneuropathy, unspecified: Secondary | ICD-10-CM | POA: Diagnosis not present

## 2018-04-12 DIAGNOSIS — I10 Essential (primary) hypertension: Secondary | ICD-10-CM | POA: Diagnosis not present

## 2018-04-12 DIAGNOSIS — D61810 Antineoplastic chemotherapy induced pancytopenia: Secondary | ICD-10-CM | POA: Diagnosis not present

## 2018-04-12 DIAGNOSIS — R942 Abnormal results of pulmonary function studies: Secondary | ICD-10-CM | POA: Diagnosis not present

## 2018-04-12 DIAGNOSIS — R110 Nausea: Secondary | ICD-10-CM | POA: Diagnosis not present

## 2018-04-12 DIAGNOSIS — R42 Dizziness and giddiness: Secondary | ICD-10-CM | POA: Diagnosis not present

## 2018-04-12 DIAGNOSIS — C9001 Multiple myeloma in remission: Secondary | ICD-10-CM | POA: Diagnosis not present

## 2018-04-12 DIAGNOSIS — G8929 Other chronic pain: Secondary | ICD-10-CM | POA: Diagnosis not present

## 2018-04-12 DIAGNOSIS — K5900 Constipation, unspecified: Secondary | ICD-10-CM | POA: Diagnosis not present

## 2018-04-13 DIAGNOSIS — D61810 Antineoplastic chemotherapy induced pancytopenia: Secondary | ICD-10-CM | POA: Diagnosis not present

## 2018-04-13 DIAGNOSIS — Z79899 Other long term (current) drug therapy: Secondary | ICD-10-CM | POA: Diagnosis not present

## 2018-04-13 DIAGNOSIS — Z792 Long term (current) use of antibiotics: Secondary | ICD-10-CM | POA: Diagnosis not present

## 2018-04-13 DIAGNOSIS — D61818 Other pancytopenia: Secondary | ICD-10-CM | POA: Diagnosis not present

## 2018-04-13 DIAGNOSIS — C9001 Multiple myeloma in remission: Secondary | ICD-10-CM | POA: Diagnosis not present

## 2018-04-14 DIAGNOSIS — D709 Neutropenia, unspecified: Secondary | ICD-10-CM | POA: Diagnosis not present

## 2018-04-14 DIAGNOSIS — R5081 Fever presenting with conditions classified elsewhere: Secondary | ICD-10-CM | POA: Diagnosis not present

## 2018-04-14 DIAGNOSIS — R42 Dizziness and giddiness: Secondary | ICD-10-CM | POA: Diagnosis not present

## 2018-04-14 DIAGNOSIS — K5900 Constipation, unspecified: Secondary | ICD-10-CM | POA: Diagnosis not present

## 2018-04-14 DIAGNOSIS — Z9484 Stem cells transplant status: Secondary | ICD-10-CM | POA: Diagnosis not present

## 2018-04-14 DIAGNOSIS — R942 Abnormal results of pulmonary function studies: Secondary | ICD-10-CM | POA: Diagnosis not present

## 2018-04-14 DIAGNOSIS — M545 Low back pain: Secondary | ICD-10-CM | POA: Diagnosis not present

## 2018-04-14 DIAGNOSIS — T451X5A Adverse effect of antineoplastic and immunosuppressive drugs, initial encounter: Secondary | ICD-10-CM | POA: Diagnosis not present

## 2018-04-14 DIAGNOSIS — R918 Other nonspecific abnormal finding of lung field: Secondary | ICD-10-CM | POA: Diagnosis not present

## 2018-04-14 DIAGNOSIS — C9000 Multiple myeloma not having achieved remission: Secondary | ICD-10-CM | POA: Diagnosis not present

## 2018-04-14 DIAGNOSIS — I10 Essential (primary) hypertension: Secondary | ICD-10-CM | POA: Diagnosis not present

## 2018-04-14 DIAGNOSIS — G629 Polyneuropathy, unspecified: Secondary | ICD-10-CM | POA: Diagnosis not present

## 2018-04-14 DIAGNOSIS — G8929 Other chronic pain: Secondary | ICD-10-CM | POA: Diagnosis not present

## 2018-04-14 DIAGNOSIS — E871 Hypo-osmolality and hyponatremia: Secondary | ICD-10-CM | POA: Diagnosis not present

## 2018-04-14 DIAGNOSIS — D61810 Antineoplastic chemotherapy induced pancytopenia: Secondary | ICD-10-CM | POA: Diagnosis not present

## 2018-04-14 DIAGNOSIS — Z79899 Other long term (current) drug therapy: Secondary | ICD-10-CM | POA: Diagnosis not present

## 2018-04-15 DIAGNOSIS — G629 Polyneuropathy, unspecified: Secondary | ICD-10-CM | POA: Diagnosis not present

## 2018-04-15 DIAGNOSIS — M545 Low back pain: Secondary | ICD-10-CM | POA: Diagnosis not present

## 2018-04-15 DIAGNOSIS — Z79899 Other long term (current) drug therapy: Secondary | ICD-10-CM | POA: Diagnosis not present

## 2018-04-15 DIAGNOSIS — K5900 Constipation, unspecified: Secondary | ICD-10-CM | POA: Diagnosis not present

## 2018-04-15 DIAGNOSIS — R5081 Fever presenting with conditions classified elsewhere: Secondary | ICD-10-CM | POA: Diagnosis not present

## 2018-04-15 DIAGNOSIS — T451X5A Adverse effect of antineoplastic and immunosuppressive drugs, initial encounter: Secondary | ICD-10-CM | POA: Diagnosis not present

## 2018-04-15 DIAGNOSIS — E871 Hypo-osmolality and hyponatremia: Secondary | ICD-10-CM | POA: Diagnosis not present

## 2018-04-15 DIAGNOSIS — D709 Neutropenia, unspecified: Secondary | ICD-10-CM | POA: Diagnosis not present

## 2018-04-15 DIAGNOSIS — C9000 Multiple myeloma not having achieved remission: Secondary | ICD-10-CM | POA: Diagnosis not present

## 2018-04-15 DIAGNOSIS — Z9484 Stem cells transplant status: Secondary | ICD-10-CM | POA: Diagnosis not present

## 2018-04-15 DIAGNOSIS — I10 Essential (primary) hypertension: Secondary | ICD-10-CM | POA: Diagnosis not present

## 2018-04-15 DIAGNOSIS — G8929 Other chronic pain: Secondary | ICD-10-CM | POA: Diagnosis not present

## 2018-04-16 DIAGNOSIS — G629 Polyneuropathy, unspecified: Secondary | ICD-10-CM | POA: Diagnosis not present

## 2018-04-16 DIAGNOSIS — Z452 Encounter for adjustment and management of vascular access device: Secondary | ICD-10-CM | POA: Diagnosis not present

## 2018-04-16 DIAGNOSIS — T451X5A Adverse effect of antineoplastic and immunosuppressive drugs, initial encounter: Secondary | ICD-10-CM | POA: Diagnosis not present

## 2018-04-16 DIAGNOSIS — K5900 Constipation, unspecified: Secondary | ICD-10-CM | POA: Diagnosis not present

## 2018-04-16 DIAGNOSIS — E871 Hypo-osmolality and hyponatremia: Secondary | ICD-10-CM | POA: Diagnosis not present

## 2018-04-16 DIAGNOSIS — R5081 Fever presenting with conditions classified elsewhere: Secondary | ICD-10-CM | POA: Diagnosis not present

## 2018-04-16 DIAGNOSIS — Z9484 Stem cells transplant status: Secondary | ICD-10-CM | POA: Diagnosis not present

## 2018-04-16 DIAGNOSIS — I10 Essential (primary) hypertension: Secondary | ICD-10-CM | POA: Diagnosis not present

## 2018-04-16 DIAGNOSIS — C9000 Multiple myeloma not having achieved remission: Secondary | ICD-10-CM | POA: Diagnosis not present

## 2018-04-16 DIAGNOSIS — Z79899 Other long term (current) drug therapy: Secondary | ICD-10-CM | POA: Diagnosis not present

## 2018-04-16 DIAGNOSIS — D709 Neutropenia, unspecified: Secondary | ICD-10-CM | POA: Diagnosis not present

## 2018-04-16 DIAGNOSIS — M545 Low back pain: Secondary | ICD-10-CM | POA: Diagnosis not present

## 2018-04-16 DIAGNOSIS — Z87891 Personal history of nicotine dependence: Secondary | ICD-10-CM | POA: Diagnosis not present

## 2018-04-16 DIAGNOSIS — G8929 Other chronic pain: Secondary | ICD-10-CM | POA: Diagnosis not present

## 2018-04-17 DIAGNOSIS — D709 Neutropenia, unspecified: Secondary | ICD-10-CM | POA: Diagnosis not present

## 2018-04-17 DIAGNOSIS — D61810 Antineoplastic chemotherapy induced pancytopenia: Secondary | ICD-10-CM | POA: Diagnosis not present

## 2018-04-17 DIAGNOSIS — G8929 Other chronic pain: Secondary | ICD-10-CM | POA: Diagnosis not present

## 2018-04-17 DIAGNOSIS — G629 Polyneuropathy, unspecified: Secondary | ICD-10-CM | POA: Diagnosis not present

## 2018-04-17 DIAGNOSIS — E871 Hypo-osmolality and hyponatremia: Secondary | ICD-10-CM | POA: Diagnosis not present

## 2018-04-17 DIAGNOSIS — I10 Essential (primary) hypertension: Secondary | ICD-10-CM | POA: Diagnosis not present

## 2018-04-17 DIAGNOSIS — M545 Low back pain: Secondary | ICD-10-CM | POA: Diagnosis not present

## 2018-04-17 DIAGNOSIS — Z9484 Stem cells transplant status: Secondary | ICD-10-CM | POA: Diagnosis not present

## 2018-04-17 DIAGNOSIS — R5081 Fever presenting with conditions classified elsewhere: Secondary | ICD-10-CM | POA: Diagnosis not present

## 2018-04-17 DIAGNOSIS — C9000 Multiple myeloma not having achieved remission: Secondary | ICD-10-CM | POA: Diagnosis not present

## 2018-04-17 DIAGNOSIS — Z79899 Other long term (current) drug therapy: Secondary | ICD-10-CM | POA: Diagnosis not present

## 2018-04-17 DIAGNOSIS — T451X5A Adverse effect of antineoplastic and immunosuppressive drugs, initial encounter: Secondary | ICD-10-CM | POA: Diagnosis not present

## 2018-04-24 ENCOUNTER — Inpatient Hospital Stay (HOSPITAL_COMMUNITY): Payer: Medicare HMO | Attending: Hematology

## 2018-04-24 DIAGNOSIS — C9000 Multiple myeloma not having achieved remission: Secondary | ICD-10-CM | POA: Insufficient documentation

## 2018-04-24 LAB — COMPREHENSIVE METABOLIC PANEL WITH GFR
ALT: 17 U/L (ref 0–44)
AST: 17 U/L (ref 15–41)
Albumin: 3.8 g/dL (ref 3.5–5.0)
Alkaline Phosphatase: 60 U/L (ref 38–126)
Anion gap: 10 (ref 5–15)
BUN: 17 mg/dL (ref 8–23)
CO2: 23 mmol/L (ref 22–32)
Calcium: 9.3 mg/dL (ref 8.9–10.3)
Chloride: 101 mmol/L (ref 98–111)
Creatinine, Ser: 0.65 mg/dL (ref 0.44–1.00)
GFR calc Af Amer: >60 mL/min
GFR calc non Af Amer: >60 mL/min
Glucose, Bld: 118 mg/dL — ABNORMAL HIGH (ref 70–99)
Potassium: 3.6 mmol/L (ref 3.5–5.1)
Sodium: 134 mmol/L — ABNORMAL LOW (ref 135–145)
Total Bilirubin: 0.4 mg/dL (ref 0.3–1.2)
Total Protein: 6.9 g/dL (ref 6.5–8.1)

## 2018-04-24 LAB — CBC WITH DIFFERENTIAL/PLATELET
Abs Immature Granulocytes: 0.03 10*3/uL (ref 0.00–0.07)
BASOS ABS: 0 10*3/uL (ref 0.0–0.1)
Basophils Relative: 0 %
Eosinophils Absolute: 0 10*3/uL (ref 0.0–0.5)
Eosinophils Relative: 0 %
HCT: 30.3 % — ABNORMAL LOW (ref 36.0–46.0)
Hemoglobin: 9.6 g/dL — ABNORMAL LOW (ref 12.0–15.0)
Immature Granulocytes: 0 %
Lymphocytes Relative: 55 %
Lymphs Abs: 4.6 10*3/uL — ABNORMAL HIGH (ref 0.7–4.0)
MCH: 31.5 pg (ref 26.0–34.0)
MCHC: 31.7 g/dL (ref 30.0–36.0)
MCV: 99.3 fL (ref 80.0–100.0)
Monocytes Absolute: 1.6 10*3/uL — ABNORMAL HIGH (ref 0.1–1.0)
Monocytes Relative: 18 %
NRBC: 0 % (ref 0.0–0.2)
Neutro Abs: 2.4 10*3/uL (ref 1.7–7.7)
Neutrophils Relative %: 27 %
Platelets: 352 10*3/uL (ref 150–400)
RBC: 3.05 MIL/uL — AB (ref 3.87–5.11)
RDW: 17.2 % — AB (ref 11.5–15.5)
WBC: 8.6 10*3/uL (ref 4.0–10.5)

## 2018-04-24 LAB — LACTATE DEHYDROGENASE: LDH: 163 U/L (ref 98–192)

## 2018-04-25 LAB — IGG, IGA, IGM
IgA: 403 mg/dL (ref 64–422)
IgG (Immunoglobin G), Serum: 572 mg/dL — ABNORMAL LOW (ref 700–1600)
IgM (Immunoglobulin M), Srm: 43 mg/dL (ref 26–217)

## 2018-04-25 LAB — KAPPA/LAMBDA LIGHT CHAINS
Kappa free light chain: 10 mg/L (ref 3.3–19.4)
Kappa, lambda light chain ratio: 1.32 (ref 0.26–1.65)
Lambda free light chains: 7.6 mg/L (ref 5.7–26.3)

## 2018-04-26 LAB — PROTEIN ELECTROPHORESIS, SERUM
A/G Ratio: 1.3 (ref 0.7–1.7)
ALBUMIN ELP: 3.6 g/dL (ref 2.9–4.4)
Alpha-1-Globulin: 0.3 g/dL (ref 0.0–0.4)
Alpha-2-Globulin: 0.9 g/dL (ref 0.4–1.0)
Beta Globulin: 1.1 g/dL (ref 0.7–1.3)
GAMMA GLOBULIN: 0.4 g/dL (ref 0.4–1.8)
Globulin, Total: 2.7 g/dL (ref 2.2–3.9)
Total Protein ELP: 6.3 g/dL (ref 6.0–8.5)

## 2018-04-27 LAB — IMMUNOFIXATION ELECTROPHORESIS
IgA: 418 mg/dL (ref 64–422)
IgG (Immunoglobin G), Serum: 572 mg/dL — ABNORMAL LOW (ref 700–1600)
IgM (Immunoglobulin M), Srm: 39 mg/dL (ref 26–217)
Total Protein ELP: 6.4 g/dL (ref 6.0–8.5)

## 2018-05-02 DIAGNOSIS — C9001 Multiple myeloma in remission: Secondary | ICD-10-CM | POA: Diagnosis not present

## 2018-05-02 DIAGNOSIS — Z9484 Stem cells transplant status: Secondary | ICD-10-CM | POA: Diagnosis not present

## 2018-05-02 DIAGNOSIS — D649 Anemia, unspecified: Secondary | ICD-10-CM | POA: Diagnosis not present

## 2018-05-14 ENCOUNTER — Telehealth (HOSPITAL_COMMUNITY): Payer: Self-pay | Admitting: *Deleted

## 2018-05-14 NOTE — Telephone Encounter (Signed)
Note sent to provider 

## 2018-05-16 ENCOUNTER — Encounter (HOSPITAL_COMMUNITY): Payer: Self-pay | Admitting: *Deleted

## 2018-05-16 ENCOUNTER — Other Ambulatory Visit (HOSPITAL_COMMUNITY): Payer: Self-pay | Admitting: Nurse Practitioner

## 2018-05-16 DIAGNOSIS — C9000 Multiple myeloma not having achieved remission: Secondary | ICD-10-CM

## 2018-05-16 DIAGNOSIS — G479 Sleep disorder, unspecified: Secondary | ICD-10-CM

## 2018-05-16 MED ORDER — TEMAZEPAM 15 MG PO CAPS: 15.0000 mg | ORAL_CAPSULE | Freq: Every evening | ORAL | 0 refills | Status: DC | PRN

## 2018-05-16 NOTE — Progress Notes (Signed)
I spoke with patient and advised that we have called in restoril 15mg  for her to take at bedtime.  She is to take half of the pill at night (7.5 mg) to see how she tolerates the medication.  If it doesn't help she can increase to the entire pill at bedtime. It was stressed with the patient to be careful once she takes the medication as it can cause her to become very drowsy.  She is to take extra precautions not to fall.  Patient verbalizes understanding and appreciation.

## 2018-05-29 ENCOUNTER — Inpatient Hospital Stay (HOSPITAL_COMMUNITY): Payer: Medicare HMO | Attending: Hematology

## 2018-05-29 DIAGNOSIS — C9000 Multiple myeloma not having achieved remission: Secondary | ICD-10-CM | POA: Diagnosis not present

## 2018-05-29 DIAGNOSIS — Z87891 Personal history of nicotine dependence: Secondary | ICD-10-CM | POA: Diagnosis not present

## 2018-05-29 LAB — CBC WITH DIFFERENTIAL/PLATELET
Abs Immature Granulocytes: 0.02 10*3/uL (ref 0.00–0.07)
Basophils Absolute: 0 10*3/uL (ref 0.0–0.1)
Basophils Relative: 0 %
EOS PCT: 2 %
Eosinophils Absolute: 0.1 10*3/uL (ref 0.0–0.5)
HEMATOCRIT: 35.3 % — AB (ref 36.0–46.0)
Hemoglobin: 11.6 g/dL — ABNORMAL LOW (ref 12.0–15.0)
Immature Granulocytes: 0 %
LYMPHS PCT: 32 %
Lymphs Abs: 2.1 10*3/uL (ref 0.7–4.0)
MCH: 32.6 pg (ref 26.0–34.0)
MCHC: 32.9 g/dL (ref 30.0–36.0)
MCV: 99.2 fL (ref 80.0–100.0)
Monocytes Absolute: 0.7 10*3/uL (ref 0.1–1.0)
Monocytes Relative: 11 %
Neutro Abs: 3.7 10*3/uL (ref 1.7–7.7)
Neutrophils Relative %: 55 %
Platelets: 233 10*3/uL (ref 150–400)
RBC: 3.56 MIL/uL — ABNORMAL LOW (ref 3.87–5.11)
RDW: 16.5 % — ABNORMAL HIGH (ref 11.5–15.5)
WBC: 6.6 10*3/uL (ref 4.0–10.5)
nRBC: 0 % (ref 0.0–0.2)

## 2018-05-29 LAB — COMPREHENSIVE METABOLIC PANEL
ALT: 14 U/L (ref 0–44)
AST: 20 U/L (ref 15–41)
Albumin: 4.1 g/dL (ref 3.5–5.0)
Alkaline Phosphatase: 57 U/L (ref 38–126)
Anion gap: 10 (ref 5–15)
BUN: 17 mg/dL (ref 8–23)
CO2: 22 mmol/L (ref 22–32)
CREATININE: 0.81 mg/dL (ref 0.44–1.00)
Calcium: 9.9 mg/dL (ref 8.9–10.3)
Chloride: 105 mmol/L (ref 98–111)
GFR calc Af Amer: >60 mL/min (ref >60)
GFR calc non Af Amer: >60 mL/min (ref >60)
Glucose, Bld: 94 mg/dL (ref 70–99)
Potassium: 4 mmol/L (ref 3.5–5.1)
Sodium: 137 mmol/L (ref 135–145)
Total Bilirubin: 0.4 mg/dL (ref 0.3–1.2)
Total Protein: 7.5 g/dL (ref 6.5–8.1)

## 2018-05-29 LAB — LACTATE DEHYDROGENASE: LDH: 130 U/L (ref 98–192)

## 2018-05-30 LAB — PROTEIN ELECTROPHORESIS, SERUM
A/G Ratio: 1.2 (ref 0.7–1.7)
Albumin ELP: 3.8 g/dL (ref 2.9–4.4)
Alpha-1-Globulin: 0.2 g/dL (ref 0.0–0.4)
Alpha-2-Globulin: 1 g/dL (ref 0.4–1.0)
Beta Globulin: 1.1 g/dL (ref 0.7–1.3)
Gamma Globulin: 0.7 g/dL (ref 0.4–1.8)
Globulin, Total: 3.1 g/dL (ref 2.2–3.9)
Total Protein ELP: 6.9 g/dL (ref 6.0–8.5)

## 2018-05-30 LAB — IMMUNOFIXATION ELECTROPHORESIS
IgA: 493 mg/dL — ABNORMAL HIGH (ref 64–422)
IgG (Immunoglobin G), Serum: 768 mg/dL (ref 700–1600)
IgM (Immunoglobulin M), Srm: 19 mg/dL — ABNORMAL LOW (ref 26–217)
Total Protein ELP: 6.7 g/dL (ref 6.0–8.5)

## 2018-05-30 LAB — KAPPA/LAMBDA LIGHT CHAINS
KAPPA FREE LGHT CHN: 14.7 mg/L (ref 3.3–19.4)
Kappa, lambda light chain ratio: 1.69 — ABNORMAL HIGH (ref 0.26–1.65)
Lambda free light chains: 8.7 mg/L (ref 5.7–26.3)

## 2018-05-30 LAB — IGG, IGA, IGM
IgA: 491 mg/dL — ABNORMAL HIGH (ref 64–422)
IgG (Immunoglobin G), Serum: 760 mg/dL (ref 700–1600)
IgM (Immunoglobulin M), Srm: 18 mg/dL — ABNORMAL LOW (ref 26–217)

## 2018-06-04 ENCOUNTER — Encounter (HOSPITAL_COMMUNITY): Payer: Self-pay | Admitting: Hematology

## 2018-06-04 ENCOUNTER — Inpatient Hospital Stay (HOSPITAL_BASED_OUTPATIENT_CLINIC_OR_DEPARTMENT_OTHER): Payer: Medicare HMO | Admitting: Hematology

## 2018-06-04 ENCOUNTER — Other Ambulatory Visit: Payer: Self-pay

## 2018-06-04 ENCOUNTER — Other Ambulatory Visit (HOSPITAL_COMMUNITY): Payer: Medicare HMO

## 2018-06-04 DIAGNOSIS — Z87891 Personal history of nicotine dependence: Secondary | ICD-10-CM | POA: Diagnosis not present

## 2018-06-04 DIAGNOSIS — C9001 Multiple myeloma in remission: Secondary | ICD-10-CM

## 2018-06-04 DIAGNOSIS — C9000 Multiple myeloma not having achieved remission: Secondary | ICD-10-CM | POA: Diagnosis not present

## 2018-06-04 NOTE — Progress Notes (Signed)
[place], [place] [address]   CLINIC:  Medical Oncology/Hematology  PCP:  Asencion Noble, [place] [address] [phone]   REASON FOR VISIT:  Follow-up for multiple myeloma  CURRENT THERAPY: Transplant in January   BRIEF ONCOLOGIC HISTORY:    Multiple myeloma ([facility])   10/10/2017 Initial Diagnosis    Multiple myeloma ([place])    11/08/2017 -  Chemotherapy    The patient had bortezomib SQ (VELCADE) chemo injection 2.5 mg, 1.3 mg/m2 = 2.5 mg, Subcutaneous,  Once, 5 of 6 cycles Administration: 2.5 mg (11/14/2017), 2.5 mg (11/21/2017), 2.5 mg (11/28/2017), 2.5 mg (12/14/2017), 2.5 mg (12/21/2017), 2.5 mg (12/28/2017), 2.5 mg (01/04/2018), 2.5 mg (01/11/2018), 2.5 mg (01/18/2018), 2.5 mg (01/25/2018), 2.5 mg (02/01/2018), 2.5 mg (02/08/2018), 2.5 mg (02/19/2018), 2.5 mg (02/26/2018)  for chemotherapy treatment.       CANCER STAGING: Cancer Staging No matching staging information was found for the patient.   INTERVAL HISTORY:  [name] 78 y.o. female returns for routine follow-up. She is here today by herself. She states that she has been doing great since her transplant. She states that she still has some numbness in her feet but no worse than before.Denies any nausea, vomiting, or diarrhea. Denies any new pains. Had not noticed any recent bleeding such as epistaxis, hematuria or hematochezia. Denies recent chest pain on exertion, shortness of breath on minimal exertion, pre-syncopal episodes, or palpitations. Denies any recent fevers, infections, or recent hospitalizations. Patient reports appetite at 75% and energy level at 75%.     REVIEW OF SYSTEMS:  Review of Systems  Neurological: Positive for numbness.     PAST MEDICAL/SURGICAL HISTORY:  Past Medical History:  Diagnosis Date  . Back pain   . Hypercholesteremia   . Hypertension    Past Surgical History:  Procedure Laterality Date  . BONE MARROW BIOPSY    .  LAMINECTOMY N/A 10/06/2017   Procedure: THORACIC NINE AND TEN LAMINECTOMY WITH RESECTION OF TUMOR, THORACIC EIGHT TO THORACIC ELEVEN FUSION WITH PEDICLE SCREW FIXATION;  Surgeon: Earnie Larsson, MD;  Location: [facility];  Service: Neurosurgery;  Laterality: N/A;  . TOTAL KNEE ARTHROPLASTY  2001     SOCIAL HISTORY:  Social History   Socioeconomic History  . Marital status: Widowed    Spouse name: Not on file  . Number of children: 2  . Years of education: Not on file  . Highest education level: Not on file  Occupational History  . Not on file  Social Needs  . Financial resource strain: Not very hard  . Food insecurity:    Worry: Never true    Inability: Never true  . Transportation needs:    Medical: No    Non-medical: No  Tobacco Use  . Smoking status: Former Smoker    Packs/day: 0.50    Years: 30.00    Pack years: 15.00    Types: Cigarettes    Last attempt to quit: 05/31/2006    Years since quitting: 12.0  . Smokeless tobacco: Never Used  Substance and Sexual Activity  . Alcohol use: Yes    Comment: wine occassionally  . Drug use: No  . Sexual activity: Not on file  Lifestyle  . Physical activity:    Days per week: 4 days    Minutes per session: 120 min  . Stress: Not at all  Relationships  . Social connections:    Talks on phone: More than three times a  week    Gets together: Once a week    Attends religious service: More than 4 times per year    Active member of club or organization: Yes    Attends meetings of clubs or organizations: More than 4 times per year    Relationship status: Widowed  . Intimate partner violence:    Fear of current or ex partner: No    Emotionally abused: No    Physically abused: No    Forced sexual activity: No  Other Topics Concern  . Not on file  Social History Narrative  . Not on file    FAMILY HISTORY:  Family History  Problem Relation Age of Onset  . Tuberculosis Mother   . Kidney failure Father   . Hypertension Paternal  Aunt   . Diabetes Paternal Aunt   . Hypertension Paternal Uncle   . Diabetes Paternal Uncle   . Hypertension Daughter   . Stroke Daughter     CURRENT MEDICATIONS:  Outpatient Encounter Medications as of 06/04/2018  Medication Sig  . acyclovir (ZOVIRAX) 800 MG tablet Take 800 mg by mouth 2 (two) times daily.   . folic acid (FOLVITE) 1 MG tablet Take 1 mg by mouth daily.   . polyethylene glycol (MIRALAX / GLYCOLAX) packet Take 17 g by mouth as needed.   . sulfamethoxazole-trimethoprim (BACTRIM DS,SEPTRA DS) 800-160 MG tablet Take 1 tablet by mouth 3 (three) times a week.   [name] amLODipine (NORVASC) 5 MG tablet Take 5 mg by mouth daily.   [name] CALCIUM-VITAMIN D PO Take 1 tablet by mouth daily.  [name] dexamethasone (DECADRON) 4 MG tablet Take 4 mg by mouth daily.   . hydrochlorothiazide (HYDRODIURIL) 25 MG tablet Take 25 mg by mouth daily.  [name] KLOR-CON M20 20 MEQ tablet TAKE 1 TABLET BY MOUTH EVERY DAY (Patient taking differently: Take 20 mEq by mouth 2 (two) times daily. )  . Multiple Vitamins-Minerals (THERA-M) TABS 1 tablet daily.   . temazepam (RESTORIL) 15 MG capsule Take 1 capsule (15 mg total) by mouth at bedtime as needed for sleep.  . [DISCONTINUED] acetaminophen (TYLENOL) 650 MG CR tablet Take 1,300 mg by mouth every 8 (eight) hours as needed for pain.  . [DISCONTINUED] acyclovir (ZOVIRAX) 400 MG tablet Take 1 tablet (400 mg total) by mouth 2 (two) times daily.  . [DISCONTINUED] ASPIRIN 81 PO Take 1 tablet by mouth daily.  . [DISCONTINUED] Cholecalciferol (VITAMIN D-1000 MAX ST) 25 MCG (1000 UT) tablet Take by mouth.  . [DISCONTINUED] gabapentin (NEURONTIN) 100 MG capsule Take by mouth.  . [DISCONTINUED] ondansetron (ZOFRAN) 8 MG tablet Take by mouth.  . [DISCONTINUED] prochlorperazine (COMPAZINE) 5 MG tablet Take by mouth.  . [DISCONTINUED] SM SENNA-S 8.6-50 MG tablet    No facility-administered encounter medications on file as of 06/04/2018.     ALLERGIES:  No Known Allergies    PHYSICAL EXAM:  ECOG Performance status: 1  Vitals:   06/04/18 1104  BP: 123/65  Pulse: 99  Resp: 12  Temp: 98.7 F (37.1 C)  SpO2: 100%   Filed Weights   06/04/18 1104  Weight: 154 lb 9.6 oz (70.1 kg)    Physical Exam Constitutional:      Appearance: Normal appearance.  Cardiovascular:     Rate and Rhythm: Normal rate and regular rhythm.     Heart sounds: Normal heart sounds.  Pulmonary:     Effort: Pulmonary effort is normal.     Breath sounds: Normal breath sounds.  Abdominal:  General: Bowel sounds are normal. There is no distension.     Palpations: Abdomen is soft.  Musculoskeletal:        General: No swelling.  Skin:    General: Skin is warm.  Neurological:     General: No focal deficit present.     Mental Status: She is alert and oriented to person, place, and time.  Psychiatric:        Mood and Affect: Mood normal.        Behavior: Behavior normal.      LABORATORY DATA:  I have reviewed the labs as listed.  CBC    Component Value Date/Time   WBC 6.6 05/29/2018 1152   RBC 3.56 (L) 05/29/2018 1152   HGB 11.6 (L) 05/29/2018 1152   HCT 35.3 (L) 05/29/2018 1152   PLT 233 05/29/2018 1152   MCV 99.2 05/29/2018 1152   MCH 32.6 05/29/2018 1152   MCHC 32.9 05/29/2018 1152   RDW 16.5 (H) 05/29/2018 1152   LYMPHSABS 2.1 05/29/2018 1152   MONOABS 0.7 05/29/2018 1152   EOSABS 0.1 05/29/2018 1152   BASOSABS 0.0 05/29/2018 1152   CMP Latest Ref Rng & Units 05/29/2018 04/24/2018 02/26/2018  Glucose 70 - 99 mg/dL 94 118(H) 142(H)  BUN 8 - 23 mg/dL '17 17 11  '$ Creatinine 0.44 - 1.00 mg/dL 0.81 0.65 0.74  Sodium 135 - 145 mmol/L 137 134(L) 134(L)  Potassium 3.5 - 5.1 mmol/L 4.0 3.6 3.4(L)  Chloride 98 - 111 mmol/L 105 101 99  CO2 22 - 32 mmol/L '22 23 24  '$ Calcium 8.9 - 10.3 mg/dL 9.9 9.3 9.2  Total Protein 6.5 - 8.1 g/dL 7.5 6.9 7.1  Total Bilirubin 0.3 - 1.2 mg/dL 0.4 0.4 0.7  Alkaline Phos 38 - 126 U/L 57 60 72  AST 15 - 41 U/L 20 17 14(L)  ALT 0 - 44 U/L  '14 17 11       '$ DIAGNOSTIC IMAGING:  I have independently reviewed the scans and discussed with the patient.   I have reviewed Venita Lick LPN's note and agree with the documentation.  I personally performed a face-to-face visit, made revisions and my assessment and plan is as follows.    ASSESSMENT & PLAN:   Multiple myeloma ([place]) 1.  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 -Presented to the emergency room on 09/29/2017 with back pain, normal x-ray of the lumbar spine. - Again presented to the ER on 10/06/2017 with difficulty walking and tingling on the dorsum of both feet.  MRI of the lumbar spine showed multiple lesions in the thoracic and lumbar vertebrae.  Pathological compression fracture of the T10 vertebral body was seen.  There is an epidural soft tissue mass measuring 9 x 15 x 44 mm at T10 vertebral body level. -T9-T10 laminectomy with resection of epidural tumor T10 bilateral transpedicular decompression/partial corpectomy on 10/06/2017, pathology consistent with plasma cell neoplasm. -Skeletal survey on 10/12/2017 shows lytic lesions in the skull and left femur. - SPEP shows 2.1 g of IgA kappa monoclonal protein.  Free light chain ratio was 1.69.  Normal renal function with a normal calcium level.  Beta-2 microglobulin was 1.4.  LDH was normal. -Bone marrow aspiration and biopsy on 10/12/2017 shows 9% plasma cells in the aspirate, and 20% CD38 positive cells on core biopsy.  Chromosome analysis shows 58, XY.  FISH panel was positive for +11, 13 q.  minus/-13.  - XRT to the spine, 20 Gray in 10 fractions completed on 11/10/2017.  - 5 cycles of VRD from 11/14/2017 through 02/19/2018.  - PET scan on 03/07/2018 did not reveal any FDG avid masses. - Autologous stem cell transplant on 04/03/2018 (preoperative regimen with melphalan 140 mg/m) - She is day +62 and is doing very well.  I have  reviewed her blood work.  All her counts are within normal limits.  Myeloma panel was also negative. - She has an appointment at [facility] for day +100 bone marrow biopsy and PET CT scan. - I will see her back on 07/20/2018 to start her on maintenance therapy.  2.  Bone strengthening:  -Last Zometa was on 02/26/2018.  We will continue to hold it. -She will continue calcium twice daily.  3.  ID prophylaxis: -She will continue Bactrim 3 times a week and acyclovir twice daily.        Orders placed this encounter:  No orders of the defined types were placed in this encounter.     Derek Jack, MD [place] [phone]

## 2018-06-04 NOTE — Patient Instructions (Addendum)
[place] at [facility] Discharge Instructions  You were seen today by Dr. Delton Coombes. He went over your recent results and everything looks fantastic. He discussed what your plan maybe after you have reached 100 days post transplant. He will see you back in May for follow up.   Thank you for choosing [place] at [facility] to provide your oncology and hematology care.  To afford each patient quality time with our provider, please arrive at least 15 minutes before your scheduled appointment time.   If you have a lab appointment with the [facility] please come in thru the  Main Entrance and check in at the main information desk  You need to re-schedule your appointment should you arrive 10 or more minutes late.  We strive to give you quality time with our providers, and arriving late affects you and other patients whose appointments are after yours.  Also, if you no show three or more times for appointments you may be dismissed from the clinic at the providers discretion.     Again, thank you for choosing [facility].  Our hope is that these requests will decrease the amount of time that you wait before being seen by our physicians.       _____________________________________________________________  Should you have questions after your visit to [facility], please contact our office at [phone] between the hours of 8:00 a.m. and 4:30 p.m.  Voicemails left after 4:00 p.m. will not be returned until the following business day.  For prescription refill requests, have your pharmacy contact our office and allow 72 hours.    Cancer Center Support Programs:   > Cancer Support Group  2nd Tuesday of the month 1pm-2pm, Journey Room

## 2018-06-04 NOTE — Assessment & Plan Note (Signed)
1.  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 - Again presented to the ER on 10/06/2017 with difficulty walking and tingling on the dorsum of both feet.  MRI of the lumbar spine showed multiple lesions in the thoracic and lumbar vertebrae.  Pathological compression fracture of the T10 vertebral body was seen.  There is an epidural soft tissue mass measuring 9 x 15 x 44 mm at T10 vertebral body level. -T9-T10 laminectomy with resection of epidural tumor T10 bilateral transpedicular decompression/partial corpectomy on 10/06/2017, pathology consistent with plasma cell neoplasm. -Skeletal survey on 10/12/2017 shows lytic lesions in the skull and left femur. - SPEP shows 2.1 g of IgA kappa monoclonal protein.  Free light chain ratio was 1.69.  Normal renal function with a normal calcium level.  Beta-2 microglobulin was 1.4.  LDH was normal. -Bone marrow aspiration and biopsy on 10/12/2017 shows 9% plasma cells in the aspirate, and 20% CD38 positive cells on core biopsy.  Chromosome analysis shows 13, XY.  FISH panel was positive for +11, 13 q. minus/-13.  - XRT to the spine, 20 Gray in 10 fractions completed on 11/10/2017.  - 5 cycles of VRD from 11/14/2017 through 02/19/2018.  - PET scan on 03/07/2018 did not reveal any FDG avid masses. - Autologous stem cell transplant on 04/03/2018 (preoperative regimen with melphalan 140 mg/m) - She is day +62 and is doing very well.  I have reviewed her blood work.  All her counts are within normal limits.  Myeloma panel was also negative. - She has an appointment at [facility] for day +100 bone marrow biopsy and PET CT scan. - I will see her back on 07/20/2018 to start her on maintenance therapy.  2.  Bone strengthening:  -Last Zometa was on 02/26/2018.  We will continue to hold  it. -She will continue calcium twice daily.  3.  ID prophylaxis: -She will continue Bactrim 3 times a week and acyclovir twice daily.

## 2018-06-11 ENCOUNTER — Other Ambulatory Visit (HOSPITAL_COMMUNITY): Payer: Self-pay | Admitting: Nurse Practitioner

## 2018-06-11 ENCOUNTER — Ambulatory Visit (HOSPITAL_COMMUNITY): Payer: Medicare HMO | Admitting: Hematology

## 2018-06-11 DIAGNOSIS — G479 Sleep disorder, unspecified: Secondary | ICD-10-CM

## 2018-06-27 ENCOUNTER — Other Ambulatory Visit (HOSPITAL_COMMUNITY): Payer: Self-pay | Admitting: Nurse Practitioner

## 2018-06-27 DIAGNOSIS — E876 Hypokalemia: Secondary | ICD-10-CM

## 2018-07-11 ENCOUNTER — Other Ambulatory Visit (HOSPITAL_COMMUNITY): Payer: Self-pay | Admitting: Nurse Practitioner

## 2018-07-11 DIAGNOSIS — C9001 Multiple myeloma in remission: Secondary | ICD-10-CM | POA: Diagnosis not present

## 2018-07-11 DIAGNOSIS — G479 Sleep disorder, unspecified: Secondary | ICD-10-CM

## 2018-07-11 DIAGNOSIS — Z9484 Stem cells transplant status: Secondary | ICD-10-CM | POA: Diagnosis not present

## 2018-07-18 DIAGNOSIS — C9001 Multiple myeloma in remission: Secondary | ICD-10-CM | POA: Diagnosis not present

## 2018-07-18 DIAGNOSIS — Z9484 Stem cells transplant status: Secondary | ICD-10-CM | POA: Diagnosis not present

## 2018-08-10 ENCOUNTER — Other Ambulatory Visit (HOSPITAL_COMMUNITY): Payer: Self-pay | Admitting: Nurse Practitioner

## 2018-08-10 DIAGNOSIS — G479 Sleep disorder, unspecified: Secondary | ICD-10-CM

## 2018-08-16 ENCOUNTER — Other Ambulatory Visit: Payer: Self-pay

## 2018-08-16 ENCOUNTER — Encounter (HOSPITAL_COMMUNITY): Payer: Self-pay | Admitting: Hematology

## 2018-08-16 ENCOUNTER — Inpatient Hospital Stay (HOSPITAL_COMMUNITY): Payer: Medicare HMO | Attending: Hematology | Admitting: Hematology

## 2018-08-16 VITALS — BP 124/54 | HR 97 | Temp 98.4°F | Resp 16 | Wt 159.2 lb

## 2018-08-16 DIAGNOSIS — C9000 Multiple myeloma not having achieved remission: Secondary | ICD-10-CM | POA: Diagnosis not present

## 2018-08-16 DIAGNOSIS — Z87891 Personal history of nicotine dependence: Secondary | ICD-10-CM | POA: Diagnosis not present

## 2018-08-16 DIAGNOSIS — C9001 Multiple myeloma in remission: Secondary | ICD-10-CM

## 2018-08-16 MED ORDER — LENALIDOMIDE 10 MG PO CAPS: 10.0000 mg | ORAL_CAPSULE | Freq: Every day | ORAL | 3 refills | Status: DC

## 2018-08-16 NOTE — Progress Notes (Signed)
[place], [place] [id number]   CLINIC:  Medical Oncology/Hematology  PCP:  Asencion Noble, [place] [address] [address] [phone]   REASON FOR VISIT:  Follow-up for multiple myeloma  CURRENT THERAPY: Transplant in January   BRIEF ONCOLOGIC HISTORY:    Multiple myeloma ([place])   10/10/2017 Initial Diagnosis    Multiple myeloma ([place])    11/14/2017 - 02/26/2018 Chemotherapy    The patient had bortezomib SQ (VELCADE) chemo injection 2.5 mg, 1.3 mg/m2 = 2.5 mg, Subcutaneous,  Once, 5 of 6 cycles Administration: 2.5 mg (11/14/2017), 2.5 mg (11/21/2017), 2.5 mg (11/28/2017), 2.5 mg (12/14/2017), 2.5 mg (12/21/2017), 2.5 mg (12/28/2017), 2.5 mg (01/04/2018), 2.5 mg (01/11/2018), 2.5 mg (01/18/2018), 2.5 mg (01/25/2018), 2.5 mg (02/01/2018), 2.5 mg (02/08/2018), 2.5 mg (02/19/2018), 2.5 mg (02/26/2018)  for chemotherapy treatment.       CANCER STAGING: Cancer Staging No matching staging information was found for the patient.   INTERVAL HISTORY:  Jamie Ewing 78 y.o. female returns for follow-up of multiple myeloma.  She is status post bone marrow transplant.  She denies any new onset bone pains.  She reportedly had a bone marrow biopsy and PET CT scan on 07/11/2026 [facility].  Minor numbness in the toes and fingertips which is also stable.  No infections reported in the last 2 months.  Appetite and energy levels are 100%.  Pain is reported 0.  No recent hospitalizations or ER visit.    REVIEW OF SYSTEMS:  Review of Systems  Neurological: Positive for numbness.  All other systems reviewed and are negative.    PAST MEDICAL/SURGICAL HISTORY:  Past Medical History:  Diagnosis Date  . Back pain   . Hypercholesteremia   . Hypertension    Past Surgical History:  Procedure Laterality Date  . BONE MARROW BIOPSY    . LAMINECTOMY N/A 10/06/2017   Procedure: THORACIC NINE AND TEN LAMINECTOMY WITH RESECTION OF  TUMOR, THORACIC EIGHT TO THORACIC ELEVEN FUSION WITH PEDICLE SCREW FIXATION;  Surgeon: Earnie Larsson, MD;  Location: [facility];  Service: Neurosurgery;  Laterality: N/A;  . TOTAL KNEE ARTHROPLASTY  2001     SOCIAL HISTORY:  Social History   Socioeconomic History  . Marital status: Widowed    Spouse name: Not on file  . Number of children: 2  . Years of education: Not on file  . Highest education level: Not on file  Occupational History  . Not on file  Social Needs  . Financial resource strain: Not very hard  . Food insecurity:    Worry: Never true    Inability: Never true  . Transportation needs:    Medical: No    Non-medical: No  Tobacco Use  . Smoking status: Former Smoker    Packs/day: 0.50    Years: 30.00    Pack years: 15.00    Types: Cigarettes    Last attempt to quit: 05/31/2006    Years since quitting: 12.2  . Smokeless tobacco: Never Used  Substance and Sexual Activity  . Alcohol use: Yes    Comment: wine occassionally  . Drug use: No  . Sexual activity: Not on file  Lifestyle  . Physical activity:    Days per week: 4 days    Minutes per session: 120 min  . Stress: Not at all  Relationships  . Social connections:    Talks on phone: More than three times a week    Gets  together: Once a week    Attends religious service: More than 4 times per year    Active member of club or organization: Yes    Attends meetings of clubs or organizations: More than 4 times per year    Relationship status: Widowed  . Intimate partner violence:    Fear of current or ex partner: No    Emotionally abused: No    Physically abused: No    Forced sexual activity: No  Other Topics Concern  . Not on file  Social History Narrative  . Not on file    FAMILY HISTORY:  Family History  Problem Relation Age of Onset  . Tuberculosis Mother   . Kidney failure Father   . Hypertension Paternal Aunt   . Diabetes Paternal Aunt   . Hypertension Paternal Uncle   . Diabetes Paternal Uncle    . Hypertension Daughter   . Stroke Daughter     CURRENT MEDICATIONS:  Outpatient Encounter Medications as of 08/16/2018  Medication Sig  . temazepam (RESTORIL) 15 MG capsule Take 15 mg by mouth at bedtime as needed for sleep.  [name] acyclovir (ZOVIRAX) 800 MG tablet Take 800 mg by mouth 2 (two) times daily.   [name] amLODipine (NORVASC) 5 MG tablet Take 5 mg by mouth daily.   [name] CALCIUM-VITAMIN D PO Take 1 tablet by mouth daily.  [name] dexamethasone (DECADRON) 4 MG tablet Take 4 mg by mouth daily.   . folic acid (FOLVITE) 1 MG tablet Take 1 mg by mouth daily.   . hydrochlorothiazide (HYDRODIURIL) 25 MG tablet Take 25 mg by mouth daily.  [name] lenalidomide (REVLIMID) 10 MG capsule Take 1 capsule (10 mg total) by mouth daily.  . Multiple Vitamins-Minerals (THERA-M) TABS 1 tablet daily.   . polyethylene glycol (MIRALAX / GLYCOLAX) packet Take 17 g by mouth as needed.   . potassium chloride SA (KLOR-CON M20) 20 MEQ tablet Take 1 tablet (20 mEq total) by mouth 2 (two) times daily.  [name] sulfamethoxazole-trimethoprim (BACTRIM DS,SEPTRA DS) 800-160 MG tablet Take 1 tablet by mouth 3 (three) times a week.   . [DISCONTINUED] acyclovir (ZOVIRAX) 400 MG tablet   . [DISCONTINUED] temazepam (RESTORIL) 15 MG capsule TAKE 1 CAPSULE BY MOUTH AT BEDTIME AS NEEDED FOR SLEEP (Patient not taking: Reported on 08/16/2018)   No facility-administered encounter medications on file as of 08/16/2018.     ALLERGIES:  No Known Allergies   PHYSICAL EXAM:  ECOG Performance status: 1  Vitals:   08/16/18 1025  BP: (!) 124/54  Pulse: 97  Resp: 16  Temp: 98.4 F (36.9 C)  SpO2: 100%   Filed Weights   08/16/18 1025  Weight: 159 lb 3.2 oz (72.2 kg)    Physical Exam Constitutional:      Appearance: Normal appearance.  Cardiovascular:     Rate and Rhythm: Normal rate and regular rhythm.     Heart sounds: Normal heart sounds.  Pulmonary:     Effort: Pulmonary effort is normal.     Breath sounds: Normal breath sounds.   Abdominal:     General: Bowel sounds are normal. There is no distension.     Palpations: Abdomen is soft.  Musculoskeletal:        General: No swelling.  Skin:    General: Skin is warm.  Neurological:     General: No focal deficit present.     Mental Status: She is alert and oriented to person, place, and time.  Psychiatric:  Mood and Affect: Mood normal.        Behavior: Behavior normal.      LABORATORY DATA:  I have reviewed the labs as listed.  CBC    Component Value Date/Time   WBC 6.6 05/29/2018 1152   RBC 3.56 (L) 05/29/2018 1152   HGB 11.6 (L) 05/29/2018 1152   HCT 35.3 (L) 05/29/2018 1152   PLT 233 05/29/2018 1152   MCV 99.2 05/29/2018 1152   MCH 32.6 05/29/2018 1152   MCHC 32.9 05/29/2018 1152   RDW 16.5 (H) 05/29/2018 1152   LYMPHSABS 2.1 05/29/2018 1152   MONOABS 0.7 05/29/2018 1152   EOSABS 0.1 05/29/2018 1152   BASOSABS 0.0 05/29/2018 1152   CMP Latest Ref Rng & Units 05/29/2018 04/24/2018 02/26/2018  Glucose 70 - 99 mg/dL 94 118(H) 142(H)  BUN 8 - 23 mg/dL '17 17 11  '$ Creatinine 0.44 - 1.00 mg/dL 0.81 0.65 0.74  Sodium 135 - 145 mmol/L 137 134(L) 134(L)  Potassium 3.5 - 5.1 mmol/L 4.0 3.6 3.4(L)  Chloride 98 - 111 mmol/L 105 101 99  CO2 22 - 32 mmol/L '22 23 24  '$ Calcium 8.9 - 10.3 mg/dL 9.9 9.3 9.2  Total Protein 6.5 - 8.1 g/dL 7.5 6.9 7.1  Total Bilirubin 0.3 - 1.2 mg/dL 0.4 0.4 0.7  Alkaline Phos 38 - 126 U/L 57 60 72  AST 15 - 41 U/L 20 17 14(L)  ALT 0 - 44 U/L '14 17 11       '$ DIAGNOSTIC IMAGING:  I have independently reviewed the scans and discussed with the patient.     ASSESSMENT & PLAN:   Multiple myeloma ([place]) 1.  Stage II IgA kappa multiple myeloma, standard risk:  - Presentation with back pain since October 2018.  - Presented to the ER on 10/06/2017 with difficulty walking and tingling on the dorsum of both feet. MRI of the lumbar spine showed multiple lesions in the thoracic and lumbar vertebrae.  Pathological compression  fracture of the T10 vertebral body was seen.  There is an epidural soft tissue mass measuring 9 x 15 x 44 mm at T10 vertebral body level. -T9-T10 laminectomy with resection of epidural tumor T10 bilateral transpedicular decompression/partial corpectomy on 10/06/2017, pathology consistent with plasma cell neoplasm. -Skeletal survey on 10/12/2017 shows lytic lesions in the skull and left femur. - SPEP shows 2.1 g of IgA kappa monoclonal protein.  Free light chain ratio was 1.69.  Normal renal function with a normal calcium level.  Beta-2 microglobulin was 1.4.  LDH was normal. -Bone marrow aspiration and biopsy on 10/12/2017 shows 9% plasma cells in the aspirate, and 20% CD38 positive cells on core biopsy.  Chromosome analysis shows 92, XY.  FISH panel was positive for +11, 13 q. minus/-13.  - XRT to the spine, 20 Gray in 10 fractions completed on 11/10/2017.  - 5 cycles of RVD from 11/14/2017 through 02/19/2018. -PET scan on 03/07/2018 did not reveal any FDG avid masses.  - Autologous stem cell transplant on 04/03/2018 (preoperative regimen with melphalan 140 mg/m) - Bone marrow biopsy on 07/11/2018 shows normocellular marrow with less than 1% plasma cells.  MRD was positive in 0.0014% of plasma cells.  Free light chain ratio was normal.  IgA was mildly elevated at 393. - I have recommended maintenance Revlimid 10 mg 3 weeks on 1 week off.  We talked about side effects in detail.  We will send the prescription to specialty pharmacy.  She was told to start taking baby aspirin  daily. -She will start taking Revlimid when she gets a prescription.  I will see her back in 3 weeks to check her blood counts.  We will also send baseline myeloma panel at that time.  2.  Bone strengthening: -Last Zometa was on 02/26/2018.  She does not have any jaw pains. -We will resume Zometa 4 mg every 3 months.  She will continue calcium and vitamin D supplements.  3.  ID prophylaxis: -She will continue Bactrim 3 times a week and  acyclovir twice daily.    Total time spent is 40 minutes with more than 50% of the time spent face-to-face discussing treatment plan, side effects and coordination of care.    Orders placed this encounter:  Orders Placed This Encounter  Procedures  . CBC with Differential/Platelet  . Comprehensive metabolic panel  . Protein electrophoresis, serum  . Kappa/lambda light chains  . Lactate dehydrogenase  . Immunofixation electrophoresis      Derek Jack, MD [place] [phone]

## 2018-08-16 NOTE — Patient Instructions (Addendum)
[place] at [facility] Discharge Instructions  You were seen today by Dr. Delton Coombes. He went over your recent lab results. He will see you back in 3 weeks for labs and follow up.  Start taking the Revlimid 3 weeks on 1 week off. This is a lower dose than you were on before. Take an 81mg  aspirin daily. He will start you on Zometa every 3 months.  Thank you for choosing [place] at [facility] to provide your oncology and hematology care.  To afford each patient quality time with our provider, please arrive at least 15 minutes before your scheduled appointment time.   If you have a lab appointment with the [facility] please come in thru the  Main Entrance and check in at the main information desk  You need to re-schedule your appointment should you arrive 10 or more minutes late.  We strive to give you quality time with our providers, and arriving late affects you and other patients whose appointments are after yours.  Also, if you no show three or more times for appointments you may be dismissed from the clinic at the providers discretion.     Again, thank you for choosing [facility].  Our hope is that these requests will decrease the amount of time that you wait before being seen by our physicians.       _____________________________________________________________  Should you have questions after your visit to [facility], please contact our office at [phone] between the hours of 8:00 a.m. and 4:30 p.m.  Voicemails left after 4:00 p.m. will not be returned until the following business day.  For prescription refill requests, have your pharmacy contact our office and allow 72 hours.    Cancer Center Support Programs:   > Cancer Support Group  2nd Tuesday of the month 1pm-2pm, Journey Room

## 2018-08-16 NOTE — Assessment & Plan Note (Addendum)
1.  Stage II IgA kappa multiple myeloma, standard risk:  - Presentation with back pain since October 2018.  - Presented to the ER on 10/06/2017 with difficulty walking and tingling on the dorsum of both feet. MRI of the lumbar spine showed multiple lesions in the thoracic and lumbar vertebrae.  Pathological compression fracture of the T10 vertebral body was seen.  There is an epidural soft tissue mass measuring 9 x 15 x 44 mm at T10 vertebral body level. -T9-T10 laminectomy with resection of epidural tumor T10 bilateral transpedicular decompression/partial corpectomy on 10/06/2017, pathology consistent with plasma cell neoplasm. -Skeletal survey on 10/12/2017 shows lytic lesions in the skull and left femur. - SPEP shows 2.1 g of IgA kappa monoclonal protein.  Free light chain ratio was 1.69.  Normal renal function with a normal calcium level.  Beta-2 microglobulin was 1.4.  LDH was normal. -Bone marrow aspiration and biopsy on 10/12/2017 shows 9% plasma cells in the aspirate, and 20% CD38 positive cells on core biopsy.  Chromosome analysis shows 80, XY.  FISH panel was positive for +11, 13 q. minus/-13.  - XRT to the spine, 20 Gray in 10 fractions completed on 11/10/2017.  - 5 cycles of RVD from 11/14/2017 through 02/19/2018. -PET scan on 03/07/2018 did not reveal any FDG avid masses.  - Autologous stem cell transplant on 04/03/2018 (preoperative regimen with melphalan 140 mg/m) - Bone marrow biopsy on 07/11/2018 shows normocellular marrow with less than 1% plasma cells.  MRD was positive in 0.0014% of plasma cells.  Free light chain ratio was normal.  IgA was mildly elevated at 393. - I have recommended maintenance Revlimid 10 mg 3 weeks on 1 week off.  We talked about side effects in detail.  We will send the prescription to specialty pharmacy.  She was told to start taking baby aspirin daily. -She will start taking Revlimid when she gets a prescription.  I will see her back in 3 weeks to check her blood  counts.  We will also send baseline myeloma panel at that time.  2.  Bone strengthening: -Last Zometa was on 02/26/2018.  She does not have any jaw pains. -We will resume Zometa 4 mg every 3 months.  She will continue calcium and vitamin D supplements.  3.  ID prophylaxis: -She will continue Bactrim 3 times a week and acyclovir twice daily.

## 2018-08-17 ENCOUNTER — Encounter (HOSPITAL_COMMUNITY): Payer: Self-pay | Admitting: *Deleted

## 2018-08-17 NOTE — Progress Notes (Signed)
Oncology navigator note:  I spoke with patient this morning.  Dr. Delton Coombes started her back on Revlimid maintenance dosing.  I made sure she was aware of the process for getting her medication.  She talked about the time of day to take the medicine.  She is going to start taking it in the mornings with the rest of her medications.  I offered her a calender so that she can keep up with her days and she denies needing it at this time.  I advised her to give [place] a call and let us know if she has any questions or concerns.  She verbalizes appreciation and understanding.

## 2018-08-29 DIAGNOSIS — Z124 Encounter for screening for malignant neoplasm of cervix: Secondary | ICD-10-CM | POA: Diagnosis not present

## 2018-08-29 DIAGNOSIS — Z6828 Body mass index (BMI) 28.0-28.9, adult: Secondary | ICD-10-CM | POA: Diagnosis not present

## 2018-08-29 DIAGNOSIS — Z1231 Encounter for screening mammogram for malignant neoplasm of breast: Secondary | ICD-10-CM | POA: Diagnosis not present

## 2018-09-04 ENCOUNTER — Other Ambulatory Visit: Payer: Self-pay

## 2018-09-05 ENCOUNTER — Inpatient Hospital Stay (HOSPITAL_COMMUNITY): Payer: Medicare HMO | Attending: Hematology

## 2018-09-05 ENCOUNTER — Encounter (HOSPITAL_COMMUNITY): Payer: Self-pay | Admitting: Hematology

## 2018-09-05 ENCOUNTER — Inpatient Hospital Stay (HOSPITAL_BASED_OUTPATIENT_CLINIC_OR_DEPARTMENT_OTHER): Payer: Medicare HMO | Admitting: Hematology

## 2018-09-05 DIAGNOSIS — K5900 Constipation, unspecified: Secondary | ICD-10-CM | POA: Insufficient documentation

## 2018-09-05 DIAGNOSIS — C9000 Multiple myeloma not having achieved remission: Secondary | ICD-10-CM | POA: Insufficient documentation

## 2018-09-05 DIAGNOSIS — C9001 Multiple myeloma in remission: Secondary | ICD-10-CM

## 2018-09-05 DIAGNOSIS — Z87891 Personal history of nicotine dependence: Secondary | ICD-10-CM | POA: Diagnosis not present

## 2018-09-05 LAB — CBC WITH DIFFERENTIAL/PLATELET
Abs Immature Granulocytes: 0.03 10*3/uL (ref 0.00–0.07)
Basophils Absolute: 0 10*3/uL (ref 0.0–0.1)
Basophils Relative: 0 %
Eosinophils Absolute: 0.1 10*3/uL (ref 0.0–0.5)
Eosinophils Relative: 1 %
HCT: 35.6 % — ABNORMAL LOW (ref 36.0–46.0)
Hemoglobin: 11.6 g/dL — ABNORMAL LOW (ref 12.0–15.0)
Immature Granulocytes: 0 %
Lymphocytes Relative: 26 %
Lymphs Abs: 2.1 10*3/uL (ref 0.7–4.0)
MCH: 31.6 pg (ref 26.0–34.0)
MCHC: 32.6 g/dL (ref 30.0–36.0)
MCV: 97 fL (ref 80.0–100.0)
Monocytes Absolute: 1.4 10*3/uL — ABNORMAL HIGH (ref 0.1–1.0)
Monocytes Relative: 18 %
Neutro Abs: 4.3 10*3/uL (ref 1.7–7.7)
Neutrophils Relative %: 55 %
Platelets: 294 10*3/uL (ref 150–400)
RBC: 3.67 MIL/uL — ABNORMAL LOW (ref 3.87–5.11)
RDW: 13.2 % (ref 11.5–15.5)
WBC: 7.9 10*3/uL (ref 4.0–10.5)
nRBC: 0 % (ref 0.0–0.2)

## 2018-09-05 LAB — COMPREHENSIVE METABOLIC PANEL
ALT: 18 U/L (ref 0–44)
AST: 17 U/L (ref 15–41)
Albumin: 3.9 g/dL (ref 3.5–5.0)
Alkaline Phosphatase: 76 U/L (ref 38–126)
Anion gap: 11 (ref 5–15)
BUN: 14 mg/dL (ref 8–23)
CO2: 23 mmol/L (ref 22–32)
Calcium: 9.3 mg/dL (ref 8.9–10.3)
Chloride: 100 mmol/L (ref 98–111)
Creatinine, Ser: 1.02 mg/dL — ABNORMAL HIGH (ref 0.44–1.00)
GFR calc Af Amer: >60 mL/min (ref >60)
GFR calc non Af Amer: 53 mL/min — ABNORMAL LOW (ref >60)
Glucose, Bld: 87 mg/dL (ref 70–99)
Potassium: 3.5 mmol/L (ref 3.5–5.1)
Sodium: 134 mmol/L — ABNORMAL LOW (ref 135–145)
Total Bilirubin: 0.4 mg/dL (ref 0.3–1.2)
Total Protein: 7.5 g/dL (ref 6.5–8.1)

## 2018-09-05 LAB — LACTATE DEHYDROGENASE: LDH: 118 U/L (ref 98–192)

## 2018-09-05 NOTE — Patient Instructions (Signed)
[facility] at [facility] Discharge Instructions  You were seen today by Dr. Katragadda. He went over your recent lab results. He will see you back in 4 weeks for labs and follow up.   Thank you for choosing [facility] at [facility] to provide your oncology and hematology care.  To afford each patient quality time with our provider, please arrive at least 15 minutes before your scheduled appointment time.   If you have a lab appointment with the Cancer Center please come in thru the  Main Entrance and check in at the main information desk  You need to re-schedule your appointment should you arrive 10 or more minutes late.  We strive to give you quality time with our providers, and arriving late affects you and other patients whose appointments are after yours.  Also, if you no show three or more times for appointments you may be dismissed from the clinic at the providers discretion.     Again, thank you for choosing [facility].  Our hope is that these requests will decrease the amount of time that you wait before being seen by our physicians.       _____________________________________________________________  Should you have questions after your visit to [facility], please contact our office at [phone] between the hours of 8:00 a.m. and 4:30 p.m.  Voicemails left after 4:00 p.m. will not be returned until the following business day.  For prescription refill requests, have your pharmacy contact our office and allow 72 hours.    Cancer Center Support Programs:   > Cancer Support Group  2nd Tuesday of the month 1pm-2pm, Journey Room    

## 2018-09-05 NOTE — Progress Notes (Signed)
Jamie Ewing, Jamie Ewing [id number]   CLINIC:  Medical Oncology/Hematology  PCP:  Asencion Noble, MD [address] [place] [address] [phone]   REASON FOR VISIT:  Follow-up for multiple myeloma  CURRENT THERAPY:  Revlimid maintenance.   BRIEF ONCOLOGIC HISTORY:  Oncology History  Multiple myeloma (Jamie Ewing)  10/10/2017 Initial Diagnosis   Multiple myeloma (Jamie Ewing)   11/14/2017 - 02/26/2018 Chemotherapy   The patient had bortezomib SQ (VELCADE) chemo injection 2.5 mg, 1.3 mg/m2 = 2.5 mg, Subcutaneous,  Once, 5 of 6 cycles Administration: 2.5 mg (11/14/2017), 2.5 mg (11/21/2017), 2.5 mg (11/28/2017), 2.5 mg (12/14/2017), 2.5 mg (12/21/2017), 2.5 mg (12/28/2017), 2.5 mg (01/04/2018), 2.5 mg (01/11/2018), 2.5 mg (01/18/2018), 2.5 mg (01/25/2018), 2.5 mg (02/01/2018), 2.5 mg (02/08/2018), 2.5 mg (02/19/2018), 2.5 mg (02/26/2018)  for chemotherapy treatment.       CANCER STAGING: Cancer Staging No matching staging information was found for the patient.   INTERVAL HISTORY:  Jamie Ewing 78 y.o. female seen for follow-up of multiple myeloma.  She started taking maintenance Revlimid 10 mg 3 weeks on 1 week off around 08/24/2018.  She had constipation for few days after the start of Revlimid which subsided.  She also had low back pain which lasted the first 3 days after starting Revlimid.  She no longer has the pain.  Denies any fevers or night sweats.  Denies any nausea or vomiting.  No diarrhea reported.  Appetite and energy levels are 100%.   REVIEW OF SYSTEMS:  Review of Systems  Gastrointestinal: Positive for constipation.  All other systems reviewed and are negative.    PAST MEDICAL/SURGICAL HISTORY:  Past Medical History:  Diagnosis Date  . Back pain   . Hypercholesteremia   . Hypertension    Past Surgical History:  Procedure Laterality Date  . BONE MARROW BIOPSY    . LAMINECTOMY N/A 10/06/2017   Procedure: THORACIC NINE AND TEN LAMINECTOMY  WITH RESECTION OF TUMOR, THORACIC EIGHT TO THORACIC ELEVEN FUSION WITH PEDICLE SCREW FIXATION;  Surgeon: Earnie Larsson, MD;  Location: [facility];  Service: Neurosurgery;  Laterality: N/A;  . TOTAL KNEE ARTHROPLASTY  2001     SOCIAL HISTORY:  Social History   Socioeconomic History  . Marital status: Widowed    Spouse name: Not on file  . Number of children: 2  . Years of education: Not on file  . Highest education level: Not on file  Occupational History  . Not on file  Social Needs  . Financial resource strain: Not very hard  . Food insecurity    Worry: Never true    Inability: Never true  . Transportation needs    Medical: No    Non-medical: No  Tobacco Use  . Smoking status: Former Smoker    Packs/day: 0.50    Years: 30.00    Pack years: 15.00    Types: Cigarettes    Quit date: 05/31/2006    Years since quitting: 12.2  . Smokeless tobacco: Never Used  Substance and Sexual Activity  . Alcohol use: Yes    Comment: wine occassionally  . Drug use: No  . Sexual activity: Not on file  Lifestyle  . Physical activity    Days per week: 4 days    Minutes per session: 120 min  . Stress: Not at all  Relationships  . Social connections    Talks on phone: More than three times a week    Gets together: Once a week  Attends religious service: More than 4 times per year    Active member of club or organization: Yes    Attends meetings of clubs or organizations: More than 4 times per year    Relationship status: Widowed  . Intimate partner violence    Fear of current or ex partner: No    Emotionally abused: No    Physically abused: No    Forced sexual activity: No  Other Topics Concern  . Not on file  Social History Narrative  . Not on file    FAMILY HISTORY:  Family History  Problem Relation Age of Onset  . Tuberculosis Mother   . Kidney failure Father   . Hypertension Paternal Aunt   . Diabetes Paternal Aunt   . Hypertension Paternal Uncle   . Diabetes Paternal  Uncle   . Hypertension Daughter   . Stroke Daughter     CURRENT MEDICATIONS:  Outpatient Encounter Medications as of 09/05/2018  Medication Sig  . acyclovir (ZOVIRAX) 800 MG tablet Take 800 mg by mouth 2 (two) times daily.   [name] amLODipine (NORVASC) 5 MG tablet Take 5 mg by mouth daily.   [name] CALCIUM-VITAMIN D PO Take 1 tablet by mouth daily.  [name] dexamethasone (DECADRON) 4 MG tablet Take 4 mg by mouth daily.   . folic acid (FOLVITE) 1 MG tablet Take 1 mg by mouth daily.   . hydrochlorothiazide (HYDRODIURIL) 25 MG tablet Take 25 mg by mouth daily.  [name] lenalidomide (REVLIMID) 10 MG capsule Take 1 capsule (10 mg total) by mouth daily.  . Multiple Vitamins-Minerals (THERA-M) TABS 1 tablet daily.   . polyethylene glycol (MIRALAX / GLYCOLAX) packet Take 17 g by mouth as needed.   . potassium chloride SA (KLOR-CON M20) 20 MEQ tablet Take 1 tablet (20 mEq total) by mouth 2 (two) times daily.  [name] sulfamethoxazole-trimethoprim (BACTRIM DS,SEPTRA DS) 800-160 MG tablet Take 1 tablet by mouth 3 (three) times a week.   . temazepam (RESTORIL) 15 MG capsule Take 15 mg by mouth at bedtime as needed for sleep.  . [DISCONTINUED] acyclovir (ZOVIRAX) 400 MG tablet    No facility-administered encounter medications on file as of 09/05/2018.     ALLERGIES:  No Known Allergies   PHYSICAL EXAM:  ECOG Performance status: 1  Vitals:   09/05/18 1448  BP: (!) 122/55  Pulse: 91  Resp: 18  Temp: 99.2 F (37.3 C)  SpO2: 100%   Filed Weights   09/05/18 1448  Weight: 163 lb 6.4 oz (74.1 kg)    Physical Exam Constitutional:      Appearance: Normal appearance.  Cardiovascular:     Rate and Rhythm: Normal rate and regular rhythm.     Heart sounds: Normal heart sounds.  Pulmonary:     Effort: Pulmonary effort is normal.     Breath sounds: Normal breath sounds.  Abdominal:     General: Bowel sounds are normal. There is no distension.     Palpations: Abdomen is soft.  Musculoskeletal:        General: No  swelling.  Skin:    General: Skin is warm.  Neurological:     General: No focal deficit present.     Mental Status: She is alert and oriented to person, place, and time.  Psychiatric:        Mood and Affect: Mood normal.        Behavior: Behavior normal.      LABORATORY DATA:  I have reviewed the labs as listed.  CBC    Component Value Date/Time   WBC 7.9 09/05/2018 1430   RBC 3.67 (L) 09/05/2018 1430   HGB 11.6 (L) 09/05/2018 1430   HCT 35.6 (L) 09/05/2018 1430   PLT 294 09/05/2018 1430   MCV 97.0 09/05/2018 1430   MCH 31.6 09/05/2018 1430   MCHC 32.6 09/05/2018 1430   RDW 13.2 09/05/2018 1430   LYMPHSABS 2.1 09/05/2018 1430   MONOABS 1.4 (H) 09/05/2018 1430   EOSABS 0.1 09/05/2018 1430   BASOSABS 0.0 09/05/2018 1430   CMP Latest Ref Rng & Units 09/05/2018 05/29/2018 04/24/2018  Glucose 70 - 99 mg/dL 87 94 118(H)  BUN 8 - 23 mg/dL '14 17 17  '$ Creatinine 0.44 - 1.00 mg/dL 1.02(H) 0.81 0.65  Sodium 135 - 145 mmol/L 134(L) 137 134(L)  Potassium 3.5 - 5.1 mmol/L 3.5 4.0 3.6  Chloride 98 - 111 mmol/L 100 105 101  CO2 22 - 32 mmol/L '23 22 23  '$ Calcium 8.9 - 10.3 mg/dL 9.3 9.9 9.3  Total Protein 6.5 - 8.1 g/dL 7.5 7.5 6.9  Total Bilirubin 0.3 - 1.2 mg/dL 0.4 0.4 0.4  Alkaline Phos 38 - 126 U/L 76 57 60  AST 15 - 41 U/L '17 20 17  '$ ALT 0 - 44 U/L '18 14 17       '$ DIAGNOSTIC IMAGING:  I have independently reviewed the scans and discussed with the patient.     ASSESSMENT & PLAN:   Multiple myeloma ([place]) 1.  Stage II IgA kappa multiple myeloma, standard risk:  - Presentation with back pain since October 2018.  - Presented to the ER on 10/06/2017 with difficulty walking and tingling on the dorsum of both feet. MRI of the lumbar spine showed multiple lesions in the thoracic and lumbar vertebrae.  Pathological compression fracture of the T10 vertebral body was seen.  There is an epidural soft tissue mass measuring 9 x 15 x 44 mm at T10 vertebral body level. -T9-T10 laminectomy  with resection of epidural tumor T10 bilateral transpedicular decompression/partial corpectomy on 10/06/2017, pathology consistent with plasma cell neoplasm. -Skeletal survey on 10/12/2017 shows lytic lesions in the skull and left femur. - SPEP shows 2.1 g of IgA kappa monoclonal protein.  Free light chain ratio was 1.69.  Normal renal function with a normal calcium level.  Beta-2 microglobulin was 1.4.  LDH was normal. -Bone marrow aspiration and biopsy on 10/12/2017 shows 9% plasma cells in the aspirate, and 20% CD38 positive cells on core biopsy.  Chromosome analysis shows 30, XY.  FISH panel was positive for +11, 13 q. minus/-13.  - XRT to the spine, 20 Gray in 10 fractions completed on 11/10/2017.  - 5 cycles of RVD from 11/14/2017 through 02/19/2018. -PET scan on 03/07/2018 did not reveal any FDG avid masses.  - Autologous stem cell transplant on 04/03/2018 (preoperative regimen with melphalan 140 mg/m) - Bone marrow biopsy on 07/11/2018 shows normocellular marrow with less than 1% plasma cells.  MRD was positive in 0.0014% of plasma cells.  Free light chain ratio was normal.  IgA was mildly elevated at 393. - She started taking Revlimid 10 mg on 6/3 or 08/24/2018.  She could not remember correctly.  She has noticed some constipation.  She also had back pain for 3 days which subsided. -Other than that she has been doing well in terms of energy and appetite.  No other pains reported. - We reviewed her blood work which is grossly within normal limits.  Creatinine went up slightly.  I have recommended her to drink water at least 2 L/day. - She is going to [facility] on 10/03/2018 for vaccines.  We will see her the following week with repeat labs.  2.  Bone strengthening: -Last Zometa was on 02/26/2018.  She does not have any joint pains. -She will come back tomorrow to restart her Zometa 4 mg every 3 months.  She will continue calcium and vitamin D supplements.  3.  ID prophylaxis: -She will  continue Bactrim 3 times a week and acyclovir twice daily.    Total time spent is 25 minutes with more than 50% of the time spent face-to-face discussing treatment plan, side effects and coordination of care.    Orders placed this encounter:  No orders of the defined types were placed in this encounter.     Derek Jack, MD [place] [phone]

## 2018-09-05 NOTE — Assessment & Plan Note (Addendum)
1.  Stage II IgA kappa multiple myeloma, standard risk:  - Presentation with back pain since October 2018.  - Presented to the ER on 10/06/2017 with difficulty walking and tingling on the dorsum of both feet. MRI of the lumbar spine showed multiple lesions in the thoracic and lumbar vertebrae.  Pathological compression fracture of the T10 vertebral body was seen.  There is an epidural soft tissue mass measuring 9 x 15 x 44 mm at T10 vertebral body level. -T9-T10 laminectomy with resection of epidural tumor T10 bilateral transpedicular decompression/partial corpectomy on 10/06/2017, pathology consistent with plasma cell neoplasm. -Skeletal survey on 10/12/2017 shows lytic lesions in the skull and left femur. - SPEP shows 2.1 g of IgA kappa monoclonal protein.  Free light chain ratio was 1.69.  Normal renal function with a normal calcium level.  Beta-2 microglobulin was 1.4.  LDH was normal. -Bone marrow aspiration and biopsy on 10/12/2017 shows 9% plasma cells in the aspirate, and 20% CD38 positive cells on core biopsy.  Chromosome analysis shows 76, XY.  FISH panel was positive for +11, 13 q. minus/-13.  - XRT to the spine, 20 Gray in 10 fractions completed on 11/10/2017.  - 5 cycles of RVD from 11/14/2017 through 02/19/2018. -PET scan on 03/07/2018 did not reveal any FDG avid masses.  - Autologous stem cell transplant on 04/03/2018 (preoperative regimen with melphalan 140 mg/m) - Bone marrow biopsy on 07/11/2018 shows normocellular marrow with less than 1% plasma cells.  MRD was positive in 0.0014% of plasma cells.  Free light chain ratio was normal.  IgA was mildly elevated at 393. - She started taking Revlimid 10 mg on 6/3 or 08/24/2018.  She could not remember correctly.  She has noticed some constipation.  She also had back pain for 3 days which subsided. -Other than that she has been doing well in terms of energy and appetite.  No other pains reported. - We reviewed her blood work which is grossly within  normal limits.  Creatinine went up slightly.  I have recommended her to drink water at least 2 L/day. - She is going to [facility] on 10/03/2018 for vaccines.  We will see her the following week with repeat labs.  2.  Bone strengthening: -Last Zometa was on 02/26/2018.  She does not have any joint pains. -She will come back tomorrow to restart her Zometa 4 mg every 3 months.  She will continue calcium and vitamin D supplements.  3.  ID prophylaxis: -She will continue Bactrim 3 times a week and acyclovir twice daily.

## 2018-09-06 ENCOUNTER — Inpatient Hospital Stay (HOSPITAL_COMMUNITY): Payer: Medicare HMO

## 2018-09-06 ENCOUNTER — Encounter (HOSPITAL_COMMUNITY): Payer: Self-pay

## 2018-09-06 ENCOUNTER — Other Ambulatory Visit: Payer: Self-pay

## 2018-09-06 VITALS — BP 117/54 | HR 93 | Temp 98.2°F | Resp 18 | Wt 164.6 lb

## 2018-09-06 DIAGNOSIS — C9000 Multiple myeloma not having achieved remission: Secondary | ICD-10-CM | POA: Diagnosis not present

## 2018-09-06 DIAGNOSIS — K5900 Constipation, unspecified: Secondary | ICD-10-CM | POA: Diagnosis not present

## 2018-09-06 LAB — PROTEIN ELECTROPHORESIS, SERUM
A/G Ratio: 1 (ref 0.7–1.7)
Albumin ELP: 3.4 g/dL (ref 2.9–4.4)
Alpha-1-Globulin: 0.3 g/dL (ref 0.0–0.4)
Alpha-2-Globulin: 1.1 g/dL — ABNORMAL HIGH (ref 0.4–1.0)
Beta Globulin: 1.2 g/dL (ref 0.7–1.3)
Gamma Globulin: 0.9 g/dL (ref 0.4–1.8)
Globulin, Total: 3.5 g/dL (ref 2.2–3.9)
Total Protein ELP: 6.9 g/dL (ref 6.0–8.5)

## 2018-09-06 LAB — KAPPA/LAMBDA LIGHT CHAINS
Kappa free light chain: 27.4 mg/L — ABNORMAL HIGH (ref 3.3–19.4)
Kappa, lambda light chain ratio: 1.41 (ref 0.26–1.65)
Lambda free light chains: 19.4 mg/L (ref 5.7–26.3)

## 2018-09-06 MED ORDER — ZOLEDRONIC ACID 4 MG/100ML IV SOLN
INTRAVENOUS | Status: AC
  Filled 2018-09-06: qty 100

## 2018-09-06 MED ORDER — ZOLEDRONIC ACID 4 MG/100ML IV SOLN
4.0000 mg | Freq: Once | INTRAVENOUS | Status: AC
  Administered 2018-09-06: 4 mg via INTRAVENOUS

## 2018-09-06 MED ORDER — ZOLEDRONIC ACID 4 MG/100ML IV SOLN
4.0000 mg | Freq: Once | INTRAVENOUS | Status: DC
  Filled 2018-09-06: qty 100

## 2018-09-06 MED ORDER — SODIUM CHLORIDE 0.9 % IV SOLN
Freq: Once | INTRAVENOUS | Status: AC
  Administered 2018-09-06: 13:00:00 via INTRAVENOUS

## 2018-09-06 MED ORDER — ZOLEDRONIC ACID 4 MG/100ML IV SOLN: 4.0000 mg | Freq: Once | INTRAVENOUS | Status: DC

## 2018-09-06 NOTE — Patient Instructions (Signed)
[facility]  Discharge Instructions:   _______________________________________________________________  Thank you for choosing [facility] at [facility] to provide your oncology and hematology care.  To afford each patient quality time with our providers, please arrive at least 15 minutes before your scheduled appointment.  You need to re-schedule your appointment if you arrive 10 or more minutes late.  We strive to give you quality time with our providers, and arriving late affects you and other patients whose appointments are after yours.  Also, if you no show three or more times for appointments you may be dismissed from the clinic.  Again, thank you for choosing [facility]. Our hope is that these requests will allow you access to exceptional care and in a timely manner. _______________________________________________________________  If you have questions after your visit, please contact our office at [phone] between the hours of 8:30 a.m. and 5:00 p.m. Voicemails left after 4:30 p.m. will not be returned until the following business day. _______________________________________________________________  For prescription refill requests, have your pharmacy contact our office. _______________________________________________________________  Recommendations made by the consultant and any test results will be sent to your referring physician. _______________________________________________________________ 

## 2018-09-06 NOTE — Progress Notes (Signed)
Zometa given today per MD orders. Tolerated infusion without adverse affects. Vital signs stable. No complaints at this time. Discharged from clinic ambulatory. F/U with  Cancer Center as scheduled.  

## 2018-09-07 ENCOUNTER — Other Ambulatory Visit (HOSPITAL_COMMUNITY): Payer: Self-pay | Admitting: Hematology

## 2018-09-07 DIAGNOSIS — C9001 Multiple myeloma in remission: Secondary | ICD-10-CM

## 2018-09-07 LAB — IMMUNOFIXATION ELECTROPHORESIS
IgA: 410 mg/dL (ref 64–422)
IgG (Immunoglobin G), Serum: 1007 mg/dL (ref 586–1602)
IgM (Immunoglobulin M), Srm: 25 mg/dL — ABNORMAL LOW (ref 26–217)
Total Protein ELP: 6.9 g/dL (ref 6.0–8.5)

## 2018-09-10 ENCOUNTER — Other Ambulatory Visit (HOSPITAL_COMMUNITY): Payer: Self-pay | Admitting: *Deleted

## 2018-09-11 ENCOUNTER — Other Ambulatory Visit (HOSPITAL_COMMUNITY): Payer: Self-pay | Admitting: Nurse Practitioner

## 2018-09-11 DIAGNOSIS — G479 Sleep disorder, unspecified: Secondary | ICD-10-CM

## 2018-09-20 ENCOUNTER — Other Ambulatory Visit (HOSPITAL_COMMUNITY): Payer: Self-pay | Admitting: Nurse Practitioner

## 2018-09-20 DIAGNOSIS — E876 Hypokalemia: Secondary | ICD-10-CM

## 2018-09-28 ENCOUNTER — Other Ambulatory Visit (HOSPITAL_COMMUNITY): Payer: Self-pay | Admitting: Hematology

## 2018-09-28 DIAGNOSIS — C9001 Multiple myeloma in remission: Secondary | ICD-10-CM

## 2018-10-01 ENCOUNTER — Other Ambulatory Visit (HOSPITAL_COMMUNITY): Payer: Self-pay | Admitting: *Deleted

## 2018-10-01 DIAGNOSIS — C9001 Multiple myeloma in remission: Secondary | ICD-10-CM

## 2018-10-01 MED ORDER — LENALIDOMIDE 10 MG PO CAPS: 10.0000 mg | ORAL_CAPSULE | Freq: Every day | ORAL | 0 refills | Status: DC

## 2018-10-03 DIAGNOSIS — F1021 Alcohol dependence, in remission: Secondary | ICD-10-CM | POA: Diagnosis not present

## 2018-10-03 DIAGNOSIS — Z79899 Other long term (current) drug therapy: Secondary | ICD-10-CM | POA: Diagnosis not present

## 2018-10-03 DIAGNOSIS — Z9484 Stem cells transplant status: Secondary | ICD-10-CM | POA: Diagnosis not present

## 2018-10-03 DIAGNOSIS — Z87891 Personal history of nicotine dependence: Secondary | ICD-10-CM | POA: Diagnosis not present

## 2018-10-03 DIAGNOSIS — C9001 Multiple myeloma in remission: Secondary | ICD-10-CM | POA: Diagnosis not present

## 2018-10-03 DIAGNOSIS — Z23 Encounter for immunization: Secondary | ICD-10-CM | POA: Diagnosis not present

## 2018-10-11 ENCOUNTER — Encounter (HOSPITAL_COMMUNITY): Payer: Self-pay | Admitting: Hematology

## 2018-10-11 ENCOUNTER — Ambulatory Visit (HOSPITAL_COMMUNITY): Payer: Medicare HMO

## 2018-10-11 ENCOUNTER — Other Ambulatory Visit: Payer: Self-pay

## 2018-10-11 ENCOUNTER — Inpatient Hospital Stay (HOSPITAL_COMMUNITY): Payer: Medicare HMO | Attending: Hematology

## 2018-10-11 ENCOUNTER — Inpatient Hospital Stay (HOSPITAL_BASED_OUTPATIENT_CLINIC_OR_DEPARTMENT_OTHER): Payer: Medicare HMO | Admitting: Hematology

## 2018-10-11 VITALS — BP 125/61 | HR 82 | Temp 97.5°F | Resp 16 | Wt 161.7 lb

## 2018-10-11 DIAGNOSIS — Z87891 Personal history of nicotine dependence: Secondary | ICD-10-CM | POA: Diagnosis not present

## 2018-10-11 DIAGNOSIS — C9000 Multiple myeloma not having achieved remission: Secondary | ICD-10-CM | POA: Diagnosis not present

## 2018-10-11 DIAGNOSIS — C9001 Multiple myeloma in remission: Secondary | ICD-10-CM

## 2018-10-11 LAB — COMPREHENSIVE METABOLIC PANEL
ALT: 20 U/L (ref 0–44)
AST: 19 U/L (ref 15–41)
Albumin: 4 g/dL (ref 3.5–5.0)
Alkaline Phosphatase: 73 U/L (ref 38–126)
Anion gap: 12 (ref 5–15)
BUN: 15 mg/dL (ref 8–23)
CO2: 24 mmol/L (ref 22–32)
Calcium: 9.8 mg/dL (ref 8.9–10.3)
Chloride: 101 mmol/L (ref 98–111)
Creatinine, Ser: 0.94 mg/dL (ref 0.44–1.00)
GFR calc Af Amer: >60 mL/min (ref >60)
GFR calc non Af Amer: 59 mL/min — ABNORMAL LOW (ref >60)
Glucose, Bld: 119 mg/dL — ABNORMAL HIGH (ref 70–99)
Potassium: 3.7 mmol/L (ref 3.5–5.1)
Sodium: 137 mmol/L (ref 135–145)
Total Bilirubin: 0.6 mg/dL (ref 0.3–1.2)
Total Protein: 7.9 g/dL (ref 6.5–8.1)

## 2018-10-11 LAB — CBC WITH DIFFERENTIAL/PLATELET
Abs Immature Granulocytes: 0.01 10*3/uL (ref 0.00–0.07)
Basophils Absolute: 0 10*3/uL (ref 0.0–0.1)
Basophils Relative: 1 %
Eosinophils Absolute: 0.4 10*3/uL (ref 0.0–0.5)
Eosinophils Relative: 9 %
HCT: 37.6 % (ref 36.0–46.0)
Hemoglobin: 12.2 g/dL (ref 12.0–15.0)
Immature Granulocytes: 0 %
Lymphocytes Relative: 37 %
Lymphs Abs: 1.6 10*3/uL (ref 0.7–4.0)
MCH: 31.7 pg (ref 26.0–34.0)
MCHC: 32.4 g/dL (ref 30.0–36.0)
MCV: 97.7 fL (ref 80.0–100.0)
Monocytes Absolute: 0.7 10*3/uL (ref 0.1–1.0)
Monocytes Relative: 16 %
Neutro Abs: 1.6 10*3/uL — ABNORMAL LOW (ref 1.7–7.7)
Neutrophils Relative %: 37 %
Platelets: 259 10*3/uL (ref 150–400)
RBC: 3.85 MIL/uL — ABNORMAL LOW (ref 3.87–5.11)
RDW: 14 % (ref 11.5–15.5)
WBC: 4.3 10*3/uL (ref 4.0–10.5)
nRBC: 0 % (ref 0.0–0.2)

## 2018-10-11 NOTE — Patient Instructions (Addendum)
[facility] at [facility] Discharge Instructions  You were seen today by Dr. Katragadda. He went over your recent lab results. He will see you back in 4 weeks for labs and follow up.   Thank you for choosing [facility] at [facility] to provide your oncology and hematology care.  To afford each patient quality time with our provider, please arrive at least 15 minutes before your scheduled appointment time.   If you have a lab appointment with the Cancer Center please come in thru the  Main Entrance and check in at the main information desk  You need to re-schedule your appointment should you arrive 10 or more minutes late.  We strive to give you quality time with our providers, and arriving late affects you and other patients whose appointments are after yours.  Also, if you no show three or more times for appointments you may be dismissed from the clinic at the providers discretion.     Again, thank you for choosing [facility].  Our hope is that these requests will decrease the amount of time that you wait before being seen by our physicians.       _____________________________________________________________  Should you have questions after your visit to [facility], please contact our office at [phone] between the hours of 8:00 a.m. and 4:30 p.m.  Voicemails left after 4:00 p.m. will not be returned until the following business day.  For prescription refill requests, have your pharmacy contact our office and allow 72 hours.    Cancer Center Support Programs:   > Cancer Support Group  2nd Tuesday of the month 1pm-2pm, Journey Room    

## 2018-10-11 NOTE — Progress Notes (Signed)
Pt is taking Revlimid as prescribed with no side effects. 

## 2018-10-12 ENCOUNTER — Other Ambulatory Visit (HOSPITAL_COMMUNITY): Payer: Self-pay | Admitting: Nurse Practitioner

## 2018-10-12 DIAGNOSIS — G479 Sleep disorder, unspecified: Secondary | ICD-10-CM

## 2018-10-12 DIAGNOSIS — E876 Hypokalemia: Secondary | ICD-10-CM

## 2018-10-18 NOTE — Progress Notes (Signed)
Jamie Ewing, [place] [address]   CLINIC:  Medical Oncology/Hematology  PCP:  Asencion Noble, MD [address] [place] [address] [phone]   REASON FOR VISIT:  Follow-up for multiple myeloma  CURRENT THERAPY:  Revlimid maintenance.   BRIEF ONCOLOGIC HISTORY:  Oncology History  Multiple myeloma ([place])  10/10/2017 Initial Diagnosis   Multiple myeloma ([place])   11/14/2017 - 02/26/2018 Chemotherapy   The patient had bortezomib SQ (VELCADE) chemo injection 2.5 mg, 1.3 mg/m2 = 2.5 mg, Subcutaneous,  Once, 5 of 6 cycles Administration: 2.5 mg (11/14/2017), 2.5 mg (11/21/2017), 2.5 mg (11/28/2017), 2.5 mg (12/14/2017), 2.5 mg (12/21/2017), 2.5 mg (12/28/2017), 2.5 mg (01/04/2018), 2.5 mg (01/11/2018), 2.5 mg (01/18/2018), 2.5 mg (01/25/2018), 2.5 mg (02/01/2018), 2.5 mg (02/08/2018), 2.5 mg (02/19/2018), 2.5 mg (02/26/2018)  for chemotherapy treatment.       CANCER STAGING: Cancer Staging No matching staging information was found for the patient.   INTERVAL HISTORY:  [name] 78 y.o. female seen for follow-up of multiple myeloma.  She is taking maintenance Revlimid 10 mg 3 weeks on 1 week off, first cycle started her on 08/24/2018.  She is tolerating it very well.  Denies any nausea vomiting diarrhea or constipation.  She reportedly had vaccines done at [facility] last week.  Next vaccines will be done in October.  Denies any tingling or numbness in extremities.  Is able to do all her ADLs and IADLs.  Denies any fevers or infections.  REVIEW OF SYSTEMS:  Review of Systems  Gastrointestinal: Negative for constipation.  All other systems reviewed and are negative.    PAST MEDICAL/SURGICAL HISTORY:  Past Medical History:  Diagnosis Date  . Back pain   . Hypercholesteremia   . Hypertension    Past Surgical History:  Procedure Laterality Date  . BONE MARROW BIOPSY    . LAMINECTOMY N/A 10/06/2017   Procedure: THORACIC NINE AND  TEN LAMINECTOMY WITH RESECTION OF TUMOR, THORACIC EIGHT TO THORACIC ELEVEN FUSION WITH PEDICLE SCREW FIXATION;  Surgeon: Earnie Larsson, MD;  Location: [facility];  Service: Neurosurgery;  Laterality: N/A;  . TOTAL KNEE ARTHROPLASTY  2001     SOCIAL HISTORY:  Social History   Socioeconomic History  . Marital status: Widowed    Spouse name: Not on file  . Number of children: 2  . Years of education: Not on file  . Highest education level: Not on file  Occupational History  . Not on file  Social Needs  . Financial resource strain: Not very hard  . Food insecurity    Worry: Never true    Inability: Never true  . Transportation needs    Medical: No    Non-medical: No  Tobacco Use  . Smoking status: Former Smoker    Packs/day: 0.50    Years: 30.00    Pack years: 15.00    Types: Cigarettes    Quit date: 05/31/2006    Years since quitting: 12.3  . Smokeless tobacco: Never Used  Substance and Sexual Activity  . Alcohol use: Yes    Comment: wine occassionally  . Drug use: No  . Sexual activity: Not on file  Lifestyle  . Physical activity    Days per week: 4 days    Minutes per session: 120 min  . Stress: Not at all  Relationships  . Social connections    Talks on phone: More than three times a week    Gets together: Once a  week    Attends religious service: More than 4 times per year    Active member of club or organization: Yes    Attends meetings of clubs or organizations: More than 4 times per year    Relationship status: Widowed  . Intimate partner violence    Fear of current or ex partner: No    Emotionally abused: No    Physically abused: No    Forced sexual activity: No  Other Topics Concern  . Not on file  Social History Narrative  . Not on file    FAMILY HISTORY:  Family History  Problem Relation Age of Onset  . Tuberculosis Mother   . Kidney failure Father   . Hypertension Paternal Aunt   . Diabetes Paternal Aunt   . Hypertension Paternal Uncle   .  Diabetes Paternal Uncle   . Hypertension Daughter   . Stroke Daughter     CURRENT MEDICATIONS:  Outpatient Encounter Medications as of 10/11/2018  Medication Sig  . acyclovir (ZOVIRAX) 800 MG tablet Take 800 mg by mouth 2 (two) times daily.   [name] amLODipine (NORVASC) 5 MG tablet Take 5 mg by mouth daily.   [name] CALCIUM-VITAMIN D PO Take 1 tablet by mouth daily.  [name] dexamethasone (DECADRON) 4 MG tablet Take 4 mg by mouth daily.   . hydrochlorothiazide (HYDRODIURIL) 25 MG tablet Take 25 mg by mouth daily.  [name] lenalidomide (REVLIMID) 10 MG capsule Take 1 capsule (10 mg total) by mouth daily.  . Multiple Vitamins-Minerals (THERA-M) TABS 1 tablet daily.   . polyethylene glycol (MIRALAX / GLYCOLAX) packet Take 17 g by mouth as needed.   . [DISCONTINUED] folic acid (FOLVITE) 1 MG tablet Take 1 mg by mouth daily.   . [DISCONTINUED] KLOR-CON M20 20 MEQ tablet TAKE 1 TABLET BY MOUTH TWICE A DAY  . [DISCONTINUED] REVLIMID 10 MG capsule TAKE 1 CAPSULE ('10MG'$  TOTAL) BY MOUTH EVERY DAY  . [DISCONTINUED] temazepam (RESTORIL) 15 MG capsule TAKE 1 CAPSULE BY MOUTH AT BEDTIME AS NEEDED FOR SLEEP   No facility-administered encounter medications on file as of 10/11/2018.     ALLERGIES:  No Known Allergies   PHYSICAL EXAM:  ECOG Performance status: 1  Vitals:   10/11/18 0929  BP: 125/61  Pulse: 82  Resp: 16  Temp: (!) 97.5 F (36.4 C)  SpO2: 100%   Filed Weights   10/11/18 0929  Weight: 161 lb 11.2 oz (73.3 kg)    Physical Exam Constitutional:      Appearance: Normal appearance.  Cardiovascular:     Rate and Rhythm: Normal rate and regular rhythm.     Heart sounds: Normal heart sounds.  Pulmonary:     Effort: Pulmonary effort is normal.     Breath sounds: Normal breath sounds.  Abdominal:     General: Bowel sounds are normal. There is no distension.     Palpations: Abdomen is soft.  Musculoskeletal:        General: No swelling.  Skin:    General: Skin is warm.  Neurological:      General: No focal deficit present.     Mental Status: She is alert and oriented to person, place, and time.  Psychiatric:        Mood and Affect: Mood normal.        Behavior: Behavior normal.      LABORATORY DATA:  I have reviewed the labs as listed.  CBC    Component Value Date/Time   WBC 4.3 10/11/2018  0913   RBC 3.85 (L) 10/11/2018 0913   HGB 12.2 10/11/2018 0913   HCT 37.6 10/11/2018 0913   PLT 259 10/11/2018 0913   MCV 97.7 10/11/2018 0913   MCH 31.7 10/11/2018 0913   MCHC 32.4 10/11/2018 0913   RDW 14.0 10/11/2018 0913   LYMPHSABS 1.6 10/11/2018 0913   MONOABS 0.7 10/11/2018 0913   EOSABS 0.4 10/11/2018 0913   BASOSABS 0.0 10/11/2018 0913   CMP Latest Ref Rng & Units 10/11/2018 09/05/2018 05/29/2018  Glucose 70 - 99 mg/dL 119(H) 87 94  BUN 8 - 23 mg/dL '15 14 17  '$ Creatinine 0.44 - 1.00 mg/dL 0.94 1.02(H) 0.81  Sodium 135 - 145 mmol/L 137 134(L) 137  Potassium 3.5 - 5.1 mmol/L 3.7 3.5 4.0  Chloride 98 - 111 mmol/L 101 100 105  CO2 22 - 32 mmol/L '24 23 22  '$ Calcium 8.9 - 10.3 mg/dL 9.8 9.3 9.9  Total Protein 6.5 - 8.1 g/dL 7.9 7.5 7.5  Total Bilirubin 0.3 - 1.2 mg/dL 0.6 0.4 0.4  Alkaline Phos 38 - 126 U/L 73 76 57  AST 15 - 41 U/L '19 17 20  '$ ALT 0 - 44 U/L '20 18 14       '$ DIAGNOSTIC IMAGING:  I have independently reviewed the scans and discussed with the patient.     ASSESSMENT & PLAN:   Multiple myeloma (HCC) 1.  Stage II IgA kappa multiple myeloma, standard risk: -XRT to the spine, 20 Gray in 10 fractions completed on 11/10/2017 following T9-T10 laminectomy with resection of epidural tumor. - 5 cycles of RVD from 11/14/2017 through 02/19/2018. - Auto stem cell transplant on 04/03/2018 -Bone marrow biopsy on 07/11/2018 shows normocellular marrow with less than 1% plasma cells.  MRD was positive in 0.0014% of plasma cells.  Free light chain ratio was normal.  IgA was mildly elevated at 393. - Revlimid maintenance 10 mg 3 weeks on 1 week off started on 08/24/2018.  - She had vaccines last week at [facility].  Next vaccines are in October. -She will start taking third cycle on 10/18/2018.  We reviewed myeloma panel from 09/05/2018.  M spike was negative.  Immunofixation was negative.  Light chain ratio was normal at 1.41. -We will see her back in 4 weeks for follow-up.  2.  Bone strengthening: - Zometa 4 mg every 3 months was started back on 09/06/2018.  She will continue calcium and vitamin D supplements.  3.  ID prophylaxis: - She will continue Bactrim 3 times a week until next week and stop it.  Total time spent is 25 minutes with more than 50% of the time spent face-to-face discussing treatment plan, side effects and coordination of care.    Orders placed this encounter:  Orders Placed This Encounter  Procedures  . CBC with Differential/Platelet  . Comprehensive metabolic panel  . Protein electrophoresis, serum  . Kappa/lambda light chains  . Lactate dehydrogenase  . Immunofixation electrophoresis      Derek Jack, MD [place] [phone]

## 2018-10-18 NOTE — Assessment & Plan Note (Signed)
1.  Stage II IgA kappa multiple myeloma, standard risk: -XRT to the spine, 20 Gray in 10 fractions completed on 11/10/2017 following T9-T10 laminectomy with resection of epidural tumor. - 5 cycles of RVD from 11/14/2017 through 02/19/2018. - Auto stem cell transplant on 04/03/2018 -Bone marrow biopsy on 07/11/2018 shows normocellular marrow with less than 1% plasma cells.  MRD was positive in 0.0014% of plasma cells.  Free light chain ratio was normal.  IgA was mildly elevated at 393. - Revlimid maintenance 10 mg 3 weeks on 1 week off started on 08/24/2018. - She had vaccines last week at [facility].  Next vaccines are in October. -She will start taking third cycle on 10/18/2018.  We reviewed myeloma panel from 09/05/2018.  M spike was negative.  Immunofixation was negative.  Light chain ratio was normal at 1.41. -We will see her back in 4 weeks for follow-up.  2.  Bone strengthening: - Zometa 4 mg every 3 months was started back on 09/06/2018.  She will continue calcium and vitamin D supplements.  3.  ID prophylaxis: - She will continue Bactrim 3 times a week until next week and stop it.

## 2018-10-28 ENCOUNTER — Other Ambulatory Visit (HOSPITAL_COMMUNITY): Payer: Self-pay | Admitting: Hematology

## 2018-10-28 DIAGNOSIS — C9001 Multiple myeloma in remission: Secondary | ICD-10-CM

## 2018-10-31 ENCOUNTER — Other Ambulatory Visit (HOSPITAL_COMMUNITY): Payer: Self-pay | Admitting: *Deleted

## 2018-10-31 DIAGNOSIS — C9001 Multiple myeloma in remission: Secondary | ICD-10-CM

## 2018-10-31 MED ORDER — LENALIDOMIDE 10 MG PO CAPS: 10.0000 mg | ORAL_CAPSULE | Freq: Every day | ORAL | 0 refills | Status: DC

## 2018-10-31 NOTE — Telephone Encounter (Signed)
Chart reviewed, revlimid refilled. 

## 2018-11-08 ENCOUNTER — Inpatient Hospital Stay (HOSPITAL_COMMUNITY): Payer: Medicare HMO | Attending: Hematology

## 2018-11-08 ENCOUNTER — Inpatient Hospital Stay (HOSPITAL_BASED_OUTPATIENT_CLINIC_OR_DEPARTMENT_OTHER): Payer: Medicare HMO | Admitting: Hematology

## 2018-11-08 ENCOUNTER — Other Ambulatory Visit: Payer: Self-pay

## 2018-11-08 ENCOUNTER — Encounter (HOSPITAL_COMMUNITY): Payer: Self-pay | Admitting: Hematology

## 2018-11-08 VITALS — BP 135/61 | HR 77 | Temp 97.8°F | Resp 16 | Wt 163.4 lb

## 2018-11-08 DIAGNOSIS — Z87891 Personal history of nicotine dependence: Secondary | ICD-10-CM | POA: Diagnosis not present

## 2018-11-08 DIAGNOSIS — C9000 Multiple myeloma not having achieved remission: Secondary | ICD-10-CM

## 2018-11-08 DIAGNOSIS — C9001 Multiple myeloma in remission: Secondary | ICD-10-CM

## 2018-11-08 DIAGNOSIS — K5900 Constipation, unspecified: Secondary | ICD-10-CM | POA: Diagnosis not present

## 2018-11-08 LAB — CBC WITH DIFFERENTIAL/PLATELET
Abs Immature Granulocytes: 0.01 10*3/uL (ref 0.00–0.07)
Basophils Absolute: 0 10*3/uL (ref 0.0–0.1)
Basophils Relative: 1 %
Eosinophils Absolute: 0.3 10*3/uL (ref 0.0–0.5)
Eosinophils Relative: 7 %
HCT: 37.2 % (ref 36.0–46.0)
Hemoglobin: 12.1 g/dL (ref 12.0–15.0)
Immature Granulocytes: 0 %
Lymphocytes Relative: 44 %
Lymphs Abs: 1.9 10*3/uL (ref 0.7–4.0)
MCH: 31.8 pg (ref 26.0–34.0)
MCHC: 32.5 g/dL (ref 30.0–36.0)
MCV: 97.6 fL (ref 80.0–100.0)
Monocytes Absolute: 0.7 10*3/uL (ref 0.1–1.0)
Monocytes Relative: 18 %
Neutro Abs: 1.3 10*3/uL — ABNORMAL LOW (ref 1.7–7.7)
Neutrophils Relative %: 30 %
Platelets: 266 10*3/uL (ref 150–400)
RBC: 3.81 MIL/uL — ABNORMAL LOW (ref 3.87–5.11)
RDW: 13.9 % (ref 11.5–15.5)
WBC: 4.2 10*3/uL (ref 4.0–10.5)
nRBC: 0 % (ref 0.0–0.2)

## 2018-11-08 LAB — COMPREHENSIVE METABOLIC PANEL
ALT: 19 U/L (ref 0–44)
AST: 19 U/L (ref 15–41)
Albumin: 4 g/dL (ref 3.5–5.0)
Alkaline Phosphatase: 73 U/L (ref 38–126)
Anion gap: 10 (ref 5–15)
BUN: 12 mg/dL (ref 8–23)
CO2: 22 mmol/L (ref 22–32)
Calcium: 9.1 mg/dL (ref 8.9–10.3)
Chloride: 102 mmol/L (ref 98–111)
Creatinine, Ser: 0.87 mg/dL (ref 0.44–1.00)
GFR calc Af Amer: >60 mL/min (ref >60)
GFR calc non Af Amer: >60 mL/min (ref >60)
Glucose, Bld: 92 mg/dL (ref 70–99)
Potassium: 3.5 mmol/L (ref 3.5–5.1)
Sodium: 134 mmol/L — ABNORMAL LOW (ref 135–145)
Total Bilirubin: 0.4 mg/dL (ref 0.3–1.2)
Total Protein: 7.8 g/dL (ref 6.5–8.1)

## 2018-11-08 LAB — LACTATE DEHYDROGENASE: LDH: 115 U/L (ref 98–192)

## 2018-11-08 NOTE — Progress Notes (Signed)
Jamie Ewing, [place] [id number]   CLINIC:  Medical Oncology/Hematology  PCP:  Jamie Ewing, [place] Jamie Ewing [place] [address] [phone]   REASON FOR VISIT: Follow-up for multiple myeloma   BRIEF ONCOLOGIC HISTORY:  Oncology History  Multiple myeloma ([place])  10/10/2017 Initial Diagnosis   Multiple myeloma ([place])   11/14/2017 - 02/26/2018 Chemotherapy   The patient had bortezomib SQ (VELCADE) chemo injection 2.5 mg, 1.3 mg/m2 = 2.5 mg, Subcutaneous,  Once, 5 of 6 cycles Administration: 2.5 mg (11/14/2017), 2.5 mg (11/21/2017), 2.5 mg (11/28/2017), 2.5 mg (12/14/2017), 2.5 mg (12/21/2017), 2.5 mg (12/28/2017), 2.5 mg (01/04/2018), 2.5 mg (01/11/2018), 2.5 mg (01/18/2018), 2.5 mg (01/25/2018), 2.5 mg (02/01/2018), 2.5 mg (02/08/2018), 2.5 mg (02/19/2018), 2.5 mg (02/26/2018)  for chemotherapy treatment.       INTERVAL HISTORY:  Ms. Ewing 78 y.o. female returns for routine follow-up for multiple myeloma. She is doing well since her last visit.  She is continuing to tolerate Revlimid maintenance 3 weeks on 1 week off very well.  She had some constipation which is well controlled with MiraLAX.  Denies any nausea, vomiting, or diarrhea. Denies any new pains. Had not noticed any recent bleeding such as epistaxis, hematuria or hematochezia. Denies recent chest pain on exertion, shortness of breath on minimal exertion, pre-syncopal episodes, or palpitations. Denies any numbness or tingling in hands or feet. Denies any recent fevers, infections, or recent hospitalizations. Patient reports appetite at 100% and energy level at 100%. She is eating well and maintaining her weight at this time.      REVIEW OF SYSTEMS:  Review of Systems  All other systems reviewed and are negative.    PAST MEDICAL/SURGICAL HISTORY:  Past Medical History:  Diagnosis Date  . Back pain   . Hypercholesteremia   . Hypertension    Past Surgical History:  Procedure  Laterality Date  . BONE MARROW BIOPSY    . LAMINECTOMY N/A 10/06/2017   Procedure: THORACIC NINE AND TEN LAMINECTOMY WITH RESECTION OF TUMOR, THORACIC EIGHT TO THORACIC ELEVEN FUSION WITH PEDICLE SCREW FIXATION;  Surgeon: Earnie Larsson, MD;  Location: [facility];  Service: Neurosurgery;  Laterality: N/A;  . TOTAL KNEE ARTHROPLASTY  2001     SOCIAL HISTORY:  Social History   Socioeconomic History  . Marital status: Widowed    Spouse name: Not on file  . Number of children: 2  . Years of education: Not on file  . Highest education level: Not on file  Occupational History  . Not on file  Social Needs  . Financial resource strain: Not very hard  . Food insecurity    Worry: Never true    Inability: Never true  . Transportation needs    Medical: No    Non-medical: No  Tobacco Use  . Smoking status: Former Smoker    Packs/day: 0.50    Years: 30.00    Pack years: 15.00    Types: Cigarettes    Quit date: 05/31/2006    Years since quitting: 12.4  . Smokeless tobacco: Never Used  Substance and Sexual Activity  . Alcohol use: Yes    Comment: wine occassionally  . Drug use: No  . Sexual activity: Not on file  Lifestyle  . Physical activity    Days per week: 4 days    Minutes per session: 120 min  . Stress: Not at all  Relationships  . Social connections    Talks on phone: More than  three times a week    Gets together: Once a week    Attends religious service: More than 4 times per year    Active member of club or organization: Yes    Attends meetings of clubs or organizations: More than 4 times per year    Relationship status: Widowed  . Intimate partner violence    Fear of current or ex partner: No    Emotionally abused: No    Physically abused: No    Forced sexual activity: No  Other Topics Concern  . Not on file  Social History Narrative  . Not on file    FAMILY HISTORY:  Family History  Problem Relation Age of Onset  . Tuberculosis Mother   . Kidney failure Father    . Hypertension Paternal Aunt   . Diabetes Paternal Aunt   . Hypertension Paternal Uncle   . Diabetes Paternal Uncle   . Hypertension Daughter   . Stroke Daughter     CURRENT MEDICATIONS:  Outpatient Encounter Medications as of 11/08/2018  Medication Sig  . acyclovir (ZOVIRAX) 800 MG tablet Take 800 mg by mouth 2 (two) times daily.   [name] amLODipine (NORVASC) 5 MG tablet Take 5 mg by mouth daily.   [name] aspirin 81 MG chewable tablet Chew 81 mg by mouth daily.  . calcium carbonate (CALCIUM 600) 1500 (600 Ca) MG TABS tablet Take by mouth.  [name] CALCIUM-VITAMIN D PO Take 1 tablet by mouth daily.  . hydrochlorothiazide (HYDRODIURIL) 25 MG tablet Take 25 mg by mouth daily.  [name] KLOR-CON M20 20 MEQ tablet TAKE 1 TABLET BY MOUTH TWICE A DAY  . lenalidomide (REVLIMID) 10 MG capsule Take 1 capsule (10 mg total) by mouth daily.  . polyethylene glycol (MIRALAX / GLYCOLAX) packet Take 17 g by mouth as needed.   . temazepam (RESTORIL) 15 MG capsule TAKE 1 CAPSULE BY MOUTH AT BEDTIME AS NEEDED FOR SLEEP  . [DISCONTINUED] dexamethasone (DECADRON) 4 MG tablet Take 4 mg by mouth daily.   . [DISCONTINUED] Multiple Vitamins-Minerals (THERA-M) TABS 1 tablet daily.   . [DISCONTINUED] REVLIMID 10 MG capsule TAKE 1 CAPSULE ('10MG'$  TOTAL) BY MOUTH EVERY DAY   No facility-administered encounter medications on file as of 11/08/2018.     ALLERGIES:  No Known Allergies   PHYSICAL EXAM:  ECOG Performance status: 1  Vitals:   11/08/18 1500  BP: 135/61  Pulse: 77  Resp: 16  Temp: 97.8 F (36.6 C)  SpO2: 99%   Filed Weights   11/08/18 1500  Weight: 163 lb 6 oz (74.1 kg)    Physical Exam Constitutional:      Appearance: Normal appearance. She is normal weight.  Cardiovascular:     Rate and Rhythm: Normal rate and regular rhythm.     Heart sounds: Normal heart sounds.  Pulmonary:     Effort: Pulmonary effort is normal.     Breath sounds: Normal breath sounds.  Abdominal:     General: Bowel sounds are  normal.     Palpations: Abdomen is soft.  Musculoskeletal: Normal range of motion.  Skin:    General: Skin is warm and dry.  Neurological:     Mental Status: She is alert and oriented to person, place, and time. Mental status is at baseline.  Psychiatric:        Mood and Affect: Mood normal.        Behavior: Behavior normal.        Thought Content: Thought content normal.  Judgment: Judgment normal.      LABORATORY DATA:  I have reviewed the labs as listed.  CBC    Component Value Date/Time   WBC 4.2 11/08/2018 1425   RBC 3.81 (L) 11/08/2018 1425   HGB 12.1 11/08/2018 1425   HCT 37.2 11/08/2018 1425   PLT 266 11/08/2018 1425   MCV 97.6 11/08/2018 1425   MCH 31.8 11/08/2018 1425   MCHC 32.5 11/08/2018 1425   RDW 13.9 11/08/2018 1425   LYMPHSABS 1.9 11/08/2018 1425   MONOABS 0.7 11/08/2018 1425   EOSABS 0.3 11/08/2018 1425   BASOSABS 0.0 11/08/2018 1425   CMP Latest Ref Rng & Units 11/08/2018 10/11/2018 09/05/2018  Glucose 70 - 99 mg/dL 92 119(H) 87  BUN 8 - 23 mg/dL '12 15 14  '$ Creatinine 0.44 - 1.00 mg/dL 0.87 0.94 1.02(H)  Sodium 135 - 145 mmol/L 134(L) 137 134(L)  Potassium 3.5 - 5.1 mmol/L 3.5 3.7 3.5  Chloride 98 - 111 mmol/L 102 101 100  CO2 22 - 32 mmol/L '22 24 23  '$ Calcium 8.9 - 10.3 mg/dL 9.1 9.8 9.3  Total Protein 6.5 - 8.1 g/dL 7.8 7.9 7.5  Total Bilirubin 0.3 - 1.2 mg/dL 0.4 0.6 0.4  Alkaline Phos 38 - 126 U/L 73 73 76  AST 15 - 41 U/L '19 19 17  '$ ALT 0 - 44 U/L '19 20 18       '$ DIAGNOSTIC IMAGING:  I have independently reviewed the scans and discussed with the patient.   I have reviewed Francene Finders, NP's note and agree with the documentation.  I personally performed a face-to-face visit, made revisions and my assessment and plan is as follows.    ASSESSMENT & PLAN:   Multiple myeloma (HCC) 1.  Stage II IgA kappa multiple myeloma, standard risk: -XRT to the spine, 20 Gray in 10 fractions completed on 11/10/2017 following T9-T10 laminectomy  with resection of epidural tumor. -5 cycles of RVD from 11/14/2017 through 02/19/2018. -Auto stem cell transplant on 04/03/2018. - Bone marrow biopsy on 07/11/2018 shows normocellular marrow with less than 1% plasma cells.  MRD was positive in 0.0014% of plasma cells.  Free light chain ratio was normal.  IgA was mildly elevated at 393. - Revlimid maintenance 10 mg 3 weeks on 1 week off started on 08/24/2018. - She is going to [facility] for vaccines.  Next vaccine visit is in October. - She is tolerating Revlimid very well without any major side effects.  She does have constipation which is controlled with MiraLAX. -She will start her fourth cycle of Revlimid on 11/15/2018. -I plan to see her back in 4 weeks for follow-up.  2.  Bone strengthening: - She was started back on Zometa 4 mg every 3 months on 09/06/2018. -She will continue calcium and vitamin D supplements.     Total time spent is 25 minutes with more than 50% of the time spent face-to-face discussing treatment plan, counseling and coordination of care.  Orders placed this encounter:  Orders Placed This Encounter  Procedures  . CBC with Differential/Platelet  . Comprehensive metabolic panel  . Lactate dehydrogenase      Derek Jack, MD [place] [phone]

## 2018-11-08 NOTE — Patient Instructions (Signed)
[facility] at [facility] Discharge Instructions  Follow up in 1 months with labs    Thank you for choosing [facility] at [facility] to provide your oncology and hematology care.  To afford each patient quality time with our provider, please arrive at least 15 minutes before your scheduled appointment time.   If you have a lab appointment with the Cancer Center please come in thru the  Main Entrance and check in at the main information desk  You need to re-schedule your appointment should you arrive 10 or more minutes late.  We strive to give you quality time with our providers, and arriving late affects you and other patients whose appointments are after yours.  Also, if you no show three or more times for appointments you may be dismissed from the clinic at the providers discretion.     Again, thank you for choosing [facility].  Our hope is that these requests will decrease the amount of time that you wait before being seen by our physicians.       _____________________________________________________________  Should you have questions after your visit to [facility], please contact our office at [phone] between the hours of 8:00 a.m. and 4:30 p.m.  Voicemails left after 4:00 p.m. will not be returned until the following business day.  For prescription refill requests, have your pharmacy contact our office and allow 72 hours.    Cancer Center Support Programs:   > Cancer Support Group  2nd Tuesday of the month 1pm-2pm, Journey Room    

## 2018-11-09 LAB — KAPPA/LAMBDA LIGHT CHAINS
Kappa free light chain: 28 mg/L — ABNORMAL HIGH (ref 3.3–19.4)
Kappa, lambda light chain ratio: 1.44 (ref 0.26–1.65)
Lambda free light chains: 19.5 mg/L (ref 5.7–26.3)

## 2018-11-11 ENCOUNTER — Encounter (HOSPITAL_COMMUNITY): Payer: Self-pay | Admitting: Hematology

## 2018-11-11 LAB — PROTEIN ELECTROPHORESIS, SERUM
A/G Ratio: 1.1 (ref 0.7–1.7)
Albumin ELP: 3.7 g/dL (ref 2.9–4.4)
Alpha-1-Globulin: 0.3 g/dL (ref 0.0–0.4)
Alpha-2-Globulin: 1.1 g/dL — ABNORMAL HIGH (ref 0.4–1.0)
Beta Globulin: 1.1 g/dL (ref 0.7–1.3)
Gamma Globulin: 1 g/dL (ref 0.4–1.8)
Globulin, Total: 3.4 g/dL (ref 2.2–3.9)
Total Protein ELP: 7.1 g/dL (ref 6.0–8.5)

## 2018-11-11 LAB — IMMUNOFIXATION ELECTROPHORESIS
IgA: 488 mg/dL — ABNORMAL HIGH (ref 64–422)
IgG (Immunoglobin G), Serum: 1204 mg/dL (ref 586–1602)
IgM (Immunoglobulin M), Srm: 26 mg/dL (ref 26–217)
Total Protein ELP: 7 g/dL (ref 6.0–8.5)

## 2018-11-11 NOTE — Assessment & Plan Note (Signed)
1.  Stage II IgA kappa multiple myeloma, standard risk: -XRT to the spine, 20 Gray in 10 fractions completed on 11/10/2017 following T9-T10 laminectomy with resection of epidural tumor. -5 cycles of RVD from 11/14/2017 through 02/19/2018. -Auto stem cell transplant on 04/03/2018. - Bone marrow biopsy on 07/11/2018 shows normocellular marrow with less than 1% plasma cells.  MRD was positive in 0.0014% of plasma cells.  Free light chain ratio was normal.  IgA was mildly elevated at 393. - Revlimid maintenance 10 mg 3 weeks on 1 week off started on 08/24/2018. - She is going to [facility] for vaccines.  Next vaccine visit is in October. - She is tolerating Revlimid very well without any major side effects.  She does have constipation which is controlled with MiraLAX. -She will start her fourth cycle of Revlimid on 11/15/2018. -I plan to see her back in 4 weeks for follow-up.  2.  Bone strengthening: - She was started back on Zometa 4 mg every 3 months on 09/06/2018. -She will continue calcium and vitamin D supplements.

## 2018-11-16 ENCOUNTER — Other Ambulatory Visit (HOSPITAL_COMMUNITY): Payer: Self-pay | Admitting: Nurse Practitioner

## 2018-11-16 DIAGNOSIS — G479 Sleep disorder, unspecified: Secondary | ICD-10-CM

## 2018-11-22 ENCOUNTER — Other Ambulatory Visit (HOSPITAL_COMMUNITY): Payer: Self-pay | Admitting: Hematology

## 2018-11-22 DIAGNOSIS — C9001 Multiple myeloma in remission: Secondary | ICD-10-CM

## 2018-11-29 ENCOUNTER — Ambulatory Visit (HOSPITAL_COMMUNITY): Payer: Medicare HMO

## 2018-11-29 ENCOUNTER — Other Ambulatory Visit (HOSPITAL_COMMUNITY): Payer: Medicare HMO

## 2018-12-10 ENCOUNTER — Other Ambulatory Visit: Payer: Self-pay

## 2018-12-10 ENCOUNTER — Encounter (HOSPITAL_COMMUNITY): Payer: Self-pay | Admitting: Hematology

## 2018-12-10 ENCOUNTER — Inpatient Hospital Stay (HOSPITAL_BASED_OUTPATIENT_CLINIC_OR_DEPARTMENT_OTHER): Payer: Medicare HMO | Admitting: Hematology

## 2018-12-10 ENCOUNTER — Inpatient Hospital Stay (HOSPITAL_COMMUNITY): Payer: Medicare HMO

## 2018-12-10 ENCOUNTER — Inpatient Hospital Stay (HOSPITAL_COMMUNITY): Payer: Medicare HMO | Attending: Hematology

## 2018-12-10 VITALS — BP 122/59 | HR 72 | Temp 98.4°F | Resp 18

## 2018-12-10 VITALS — BP 140/61 | HR 78 | Temp 97.1°F | Resp 18 | Wt 168.8 lb

## 2018-12-10 DIAGNOSIS — Z23 Encounter for immunization: Secondary | ICD-10-CM | POA: Insufficient documentation

## 2018-12-10 DIAGNOSIS — C9000 Multiple myeloma not having achieved remission: Secondary | ICD-10-CM | POA: Diagnosis not present

## 2018-12-10 DIAGNOSIS — Z0000 Encounter for general adult medical examination without abnormal findings: Secondary | ICD-10-CM | POA: Diagnosis not present

## 2018-12-10 DIAGNOSIS — Z5112 Encounter for antineoplastic immunotherapy: Secondary | ICD-10-CM | POA: Insufficient documentation

## 2018-12-10 LAB — COMPREHENSIVE METABOLIC PANEL
ALT: 23 U/L (ref 0–44)
AST: 20 U/L (ref 15–41)
Albumin: 3.6 g/dL (ref 3.5–5.0)
Alkaline Phosphatase: 82 U/L (ref 38–126)
Anion gap: 8 (ref 5–15)
BUN: 18 mg/dL (ref 8–23)
CO2: 27 mmol/L (ref 22–32)
Calcium: 9.6 mg/dL (ref 8.9–10.3)
Chloride: 104 mmol/L (ref 98–111)
Creatinine, Ser: 0.73 mg/dL (ref 0.44–1.00)
GFR calc Af Amer: >60 mL/min (ref >60)
GFR calc non Af Amer: >60 mL/min (ref >60)
Glucose, Bld: 130 mg/dL — ABNORMAL HIGH (ref 70–99)
Potassium: 4.2 mmol/L (ref 3.5–5.1)
Sodium: 139 mmol/L (ref 135–145)
Total Bilirubin: 0.2 mg/dL — ABNORMAL LOW (ref 0.3–1.2)
Total Protein: 7.6 g/dL (ref 6.5–8.1)

## 2018-12-10 LAB — CBC WITH DIFFERENTIAL/PLATELET
Abs Immature Granulocytes: 0.01 10*3/uL (ref 0.00–0.07)
Basophils Absolute: 0 10*3/uL (ref 0.0–0.1)
Basophils Relative: 0 %
Eosinophils Absolute: 0.2 10*3/uL (ref 0.0–0.5)
Eosinophils Relative: 5 %
HCT: 36.2 % (ref 36.0–46.0)
Hemoglobin: 11.6 g/dL — ABNORMAL LOW (ref 12.0–15.0)
Immature Granulocytes: 0 %
Lymphocytes Relative: 40 %
Lymphs Abs: 2 10*3/uL (ref 0.7–4.0)
MCH: 31.6 pg (ref 26.0–34.0)
MCHC: 32 g/dL (ref 30.0–36.0)
MCV: 98.6 fL (ref 80.0–100.0)
Monocytes Absolute: 0.7 10*3/uL (ref 0.1–1.0)
Monocytes Relative: 13 %
Neutro Abs: 2.2 10*3/uL (ref 1.7–7.7)
Neutrophils Relative %: 42 %
Platelets: 305 10*3/uL (ref 150–400)
RBC: 3.67 MIL/uL — ABNORMAL LOW (ref 3.87–5.11)
RDW: 14.4 % (ref 11.5–15.5)
WBC: 5.1 10*3/uL (ref 4.0–10.5)
nRBC: 0 % (ref 0.0–0.2)

## 2018-12-10 LAB — LACTATE DEHYDROGENASE
LDH: 131 U/L (ref 98–192)
LDH: 122 U/L (ref 98–192)

## 2018-12-10 MED ORDER — SODIUM CHLORIDE 0.9 % IV SOLN
Freq: Once | INTRAVENOUS | Status: AC
  Administered 2018-12-10: 12:00:00 via INTRAVENOUS

## 2018-12-10 MED ORDER — INFLUENZA VAC A&B SA ADJ QUAD 0.5 ML IM PRSY
0.5000 mL | PREFILLED_SYRINGE | INTRAMUSCULAR | Status: AC
  Administered 2018-12-10: 0.5 mL via INTRAMUSCULAR

## 2018-12-10 MED ORDER — ZOLEDRONIC ACID 4 MG/5ML IV CONC: 4.0000 mg | Freq: Once | INTRAVENOUS | Status: DC

## 2018-12-10 MED ORDER — INFLUENZA VAC A&B SA ADJ QUAD 0.5 ML IM PRSY
PREFILLED_SYRINGE | INTRAMUSCULAR | Status: AC
  Filled 2018-12-10: qty 0.5

## 2018-12-10 MED ORDER — ZOLEDRONIC ACID 4 MG/100ML IV SOLN
4.0000 mg | Freq: Once | INTRAVENOUS | Status: AC
  Administered 2018-12-10: 4 mg via INTRAVENOUS
  Filled 2018-12-10: qty 100

## 2018-12-10 NOTE — Progress Notes (Signed)
[address], [place] [address]   CLINIC:  Medical Oncology/Hematology  PCP:  Asencion Noble, [place]  [place] [address] [phone]   REASON FOR VISIT: Follow-up for multiple myeloma   BRIEF ONCOLOGIC HISTORY:  Oncology History  Multiple myeloma ([place])  10/10/2017 Initial Diagnosis   Multiple myeloma ([place])   11/14/2017 - 02/26/2018 Chemotherapy   The patient had bortezomib SQ (VELCADE) chemo injection 2.5 mg, 1.3 mg/m2 = 2.5 mg, Subcutaneous,  Once, 5 of 6 cycles Administration: 2.5 mg (11/14/2017), 2.5 mg (11/21/2017), 2.5 mg (11/28/2017), 2.5 mg (12/14/2017), 2.5 mg (12/21/2017), 2.5 mg (12/28/2017), 2.5 mg (01/04/2018), 2.5 mg (01/11/2018), 2.5 mg (01/18/2018), 2.5 mg (01/25/2018), 2.5 mg (02/01/2018), 2.5 mg (02/08/2018), 2.5 mg (02/19/2018), 2.5 mg (02/26/2018)  for chemotherapy treatment.       INTERVAL HISTORY:  Ms. Killilea 78 y.o. female seen for follow-up of multiple myeloma.  She is receiving Zometa every 3 months.  She is taking Revlimid 10 mg 3 weeks on 1 week off.  This is her off week.  Denies any GI side effects including nausea, vomiting, diarrhea or constipation.  Appetite is 100%.  Energy levels are 75%.  No pains reported.  Some numbness in the feet has been stable.  She is taking calcium and vitamin D supplements.  Denies any fevers or infections.     REVIEW OF SYSTEMS:  Review of Systems  All other systems reviewed and are negative.    PAST MEDICAL/SURGICAL HISTORY:  Past Medical History:  Diagnosis Date  . Back pain   . Hypercholesteremia   . Hypertension    Past Surgical History:  Procedure Laterality Date  . BONE MARROW BIOPSY    . LAMINECTOMY N/A 10/06/2017   Procedure: THORACIC NINE AND TEN LAMINECTOMY WITH RESECTION OF TUMOR, THORACIC EIGHT TO THORACIC ELEVEN FUSION WITH PEDICLE SCREW FIXATION;  Surgeon: Earnie Larsson, MD;  Location: [facility];  Service: Neurosurgery;  Laterality: N/A;  . TOTAL KNEE  ARTHROPLASTY  2001     SOCIAL HISTORY:  Social History   Socioeconomic History  . Marital status: Widowed    Spouse name: Not on file  . Number of children: 2  . Years of education: Not on file  . Highest education level: Not on file  Occupational History  . Not on file  Social Needs  . Financial resource strain: Not very hard  . Food insecurity    Worry: Never true    Inability: Never true  . Transportation needs    Medical: No    Non-medical: No  Tobacco Use  . Smoking status: Former Smoker    Packs/day: 0.50    Years: 30.00    Pack years: 15.00    Types: Cigarettes    Quit date: 05/31/2006    Years since quitting: 12.5  . Smokeless tobacco: Never Used  Substance and Sexual Activity  . Alcohol use: Yes    Comment: wine occassionally  . Drug use: No  . Sexual activity: Not on file  Lifestyle  . Physical activity    Days per week: 4 days    Minutes per session: 120 min  . Stress: Not at all  Relationships  . Social connections    Talks on phone: More than three times a week    Gets together: Once a week    Attends religious service: More than 4 times per year    Active member of club or organization: Yes    Attends  meetings of clubs or organizations: More than 4 times per year    Relationship status: Widowed  . Intimate partner violence    Fear of current or ex partner: No    Emotionally abused: No    Physically abused: No    Forced sexual activity: No  Other Topics Concern  . Not on file  Social History Narrative  . Not on file    FAMILY HISTORY:  Family History  Problem Relation Age of Onset  . Tuberculosis Mother   . Kidney failure Father   . Hypertension Paternal Aunt   . Diabetes Paternal Aunt   . Hypertension Paternal Uncle   . Diabetes Paternal Uncle   . Hypertension Daughter   . Stroke Daughter     CURRENT MEDICATIONS:  Outpatient Encounter Medications as of 12/10/2018  Medication Sig  . acyclovir (ZOVIRAX) 800 MG tablet Take 800 mg  by mouth 2 (two) times daily.   [name] amLODipine (NORVASC) 5 MG tablet Take 5 mg by mouth daily.   [name] aspirin 81 MG chewable tablet Chew 81 mg by mouth daily.  . calcium carbonate (CALCIUM 600) 1500 (600 Ca) MG TABS tablet Take by mouth.  [name] CALCIUM-VITAMIN D PO Take 1 tablet by mouth daily.  . hydrochlorothiazide (HYDRODIURIL) 25 MG tablet Take 25 mg by mouth daily.  [name] KLOR-CON M20 20 MEQ tablet TAKE 1 TABLET BY MOUTH TWICE A DAY  . polyethylene glycol (MIRALAX / GLYCOLAX) packet Take 17 g by mouth as needed.   [name] REVLIMID 10 MG capsule TAKE 1 CAPSULE BY MOUTH EVERY DAY  . temazepam (RESTORIL) 15 MG capsule TAKE 1 CAPSULE BY MOUTH AT BEDTIME AS NEEDED FOR SLEEP  . [DISCONTINUED] REVLIMID 10 MG capsule TAKE 1 CAPSULE (10MG TOTAL) BY MOUTH EVERY DAY  . [EXPIRED] influenza vaccine adjuvanted (FLUAD) injection 0.5 mL    No facility-administered encounter medications on file as of 12/10/2018.     ALLERGIES:  No Known Allergies   PHYSICAL EXAM:  ECOG Performance status: 1  Vitals:   12/10/18 0957  BP: 140/61  Pulse: 78  Resp: 18  Temp: (!) 97.1 F (36.2 C)  SpO2: 100%   Filed Weights   12/10/18 0957  Weight: 168 lb 12.8 oz (76.6 kg)    Physical Exam Constitutional:      Appearance: Normal appearance. She is normal weight.  Cardiovascular:     Rate and Rhythm: Normal rate and regular rhythm.     Heart sounds: Normal heart sounds.  Pulmonary:     Effort: Pulmonary effort is normal.     Breath sounds: Normal breath sounds.  Abdominal:     General: Bowel sounds are normal.     Palpations: Abdomen is soft.  Musculoskeletal: Normal range of motion.  Skin:    General: Skin is warm and dry.  Neurological:     Mental Status: She is alert and oriented to person, place, and time. Mental status is at baseline.  Psychiatric:        Mood and Affect: Mood normal.        Behavior: Behavior normal.        Thought Content: Thought content normal.        Judgment: Judgment normal.       LABORATORY DATA:  I have reviewed the labs as listed.  CBC    Component Value Date/Time   WBC 5.1 12/10/2018 0934   RBC 3.67 (L) 12/10/2018 0934   HGB 11.6 (L) 12/10/2018 0934   HCT 36.2  12/10/2018 0934   PLT 305 12/10/2018 0934   MCV 98.6 12/10/2018 0934   MCH 31.6 12/10/2018 0934   MCHC 32.0 12/10/2018 0934   RDW 14.4 12/10/2018 0934   LYMPHSABS 2.0 12/10/2018 0934   MONOABS 0.7 12/10/2018 0934   EOSABS 0.2 12/10/2018 0934   BASOSABS 0.0 12/10/2018 0934   CMP Latest Ref Rng & Units 12/10/2018 11/08/2018 10/11/2018  Glucose 70 - 99 mg/dL 130(H) 92 119(H)  BUN 8 - 23 mg/dL _0 Creatinine 0.44 - 1.00 mg/dL 0.73 0.87 0.94  Sodium 135 - 145 mmol/L 139 134(L) 137  Potassium 3.5 - 5.1 mmol/L 4.2 3.5 3.7  Chloride 98 - 111 mmol/L 104 102 101  CO2 22 - 32 mmol/L _1 Calcium 8.9 - 10.3 mg/dL 9.6 9.1 9.8  Total Protein 6.5 - 8.1 g/dL 7.6 7.8 7.9  Total Bilirubin 0.3 - 1.2 mg/dL 0.2(L) 0.4 0.6  Alkaline Phos 38 - 126 U/L 82 73 73  AST 15 - 41 U/L _2 ALT 0 - 44 U/L _3 DIAGNOSTIC IMAGING:  I have independently reviewed the scans and discussed with the patient.   I have reviewed Francene Finders, NP's note and agree with the documentation.  I personally performed a face-to-face visit, made revisions and my assessment and plan is as follows.    ASSESSMENT & PLAN:   Multiple myeloma (HCC) 1.  Stage II IgA kappa multiple myeloma, standard risk: -XRT to the spine, 20 Gray in 10 fractions completed on 11/10/2017 following T9-T10 laminectomy with resection of epidural tumor. -5 cycles of RVD from 11/14/2017 through 02/19/2018. -Auto stem cell transplant on 04/03/2018. - Bone marrow biopsy on 07/11/2018 shows normocellular marrow with less than 1% plasma cells.  MRD was positive in 0.0014% of plasma cells.  Free light chain ratio was normal.  IgA was mildly elevated at 393. - Revlimid maintenance 10 mg 3 weeks on 1 week off started on 08/24/2018. - We reviewed  myeloma panel from 11/08/2018.  M spike was 0.  Free light chain ratio was 1.44.  Kappa light chains were 28. -She is tolerating Revlimid 3 weeks on 1 week of very well. -She has an appointment to see Dr. Norma Fredrickson on 01/02/2019.  I will see her back in 1 month for follow-up.  2.  Bone strengthening: - Zometa every 3 months started back on 09/06/2018. -She will receive her next dose today.  She will continue calcium and vitamin D supplements.      Total time spent is 25 minutes with more than 50% of the time spent face-to-face discussing treatment plan, counseling and coordination of care.  Orders placed this encounter:  Orders Placed This Encounter  Procedures  . Protein electrophoresis, serum  . Kappa/lambda light chains  . Lactate dehydrogenase  . Immunofixation electrophoresis      Derek Jack, MD [place] [phone]

## 2018-12-10 NOTE — Patient Instructions (Signed)
[facility] at [facility]  Discharge Instructions:   _______________________________________________________________  Thank you for choosing [facility] at [facility] to provide your oncology and hematology care.  To afford each patient quality time with our providers, please arrive at least 15 minutes before your scheduled appointment.  You need to re-schedule your appointment if you arrive 10 or more minutes late.  We strive to give you quality time with our providers, and arriving late affects you and other patients whose appointments are after yours.  Also, if you no show three or more times for appointments you may be dismissed from the clinic.  Again, thank you for choosing [facility]. Our hope is that these requests will allow you access to exceptional care and in a timely manner. _______________________________________________________________  If you have questions after your visit, please contact our office at [phone] between the hours of 8:30 a.m. and 5:00 p.m. Voicemails left after 4:30 p.m. will not be returned until the following business day. _______________________________________________________________  For prescription refill requests, have your pharmacy contact our office. _______________________________________________________________  Recommendations made by the consultant and any test results will be sent to your referring physician. _______________________________________________________________ 

## 2018-12-10 NOTE — Patient Instructions (Signed)
[facility] at [facility] Discharge Instructions  You were seen today by Dr. Katragadda. He went over your recent lab results. He will see you back in 1 month for labs and follow up.   Thank you for choosing [facility] at [facility] to provide your oncology and hematology care.  To afford each patient quality time with our provider, please arrive at least 15 minutes before your scheduled appointment time.   If you have a lab appointment with the Cancer Center please come in thru the  Main Entrance and check in at the main information desk  You need to re-schedule your appointment should you arrive 10 or more minutes late.  We strive to give you quality time with our providers, and arriving late affects you and other patients whose appointments are after yours.  Also, if you no show three or more times for appointments you may be dismissed from the clinic at the providers discretion.     Again, thank you for choosing [facility].  Our hope is that these requests will decrease the amount of time that you wait before being seen by our physicians.       _____________________________________________________________  Should you have questions after your visit to [facility], please contact our office at [phone] between the hours of 8:00 a.m. and 4:30 p.m.  Voicemails left after 4:00 p.m. will not be returned until the following business day.  For prescription refill requests, have your pharmacy contact our office and allow 72 hours.    Cancer Center Support Programs:   > Cancer Support Group  2nd Tuesday of the month 1pm-2pm, Journey Room    

## 2018-12-10 NOTE — Assessment & Plan Note (Signed)
1.  Stage II IgA kappa multiple myeloma, standard risk: -XRT to the spine, 20 Gray in 10 fractions completed on 11/10/2017 following T9-T10 laminectomy with resection of epidural tumor. -5 cycles of RVD from 11/14/2017 through 02/19/2018. -Auto stem cell transplant on 04/03/2018. - Bone marrow biopsy on 07/11/2018 shows normocellular marrow with less than 1% plasma cells.  MRD was positive in 0.0014% of plasma cells.  Free light chain ratio was normal.  IgA was mildly elevated at 393. - Revlimid maintenance 10 mg 3 weeks on 1 week off started on 08/24/2018. - We reviewed myeloma panel from 11/08/2018.  M spike was 0.  Free light chain ratio was 1.44.  Kappa light chains were 28. -She is tolerating Revlimid 3 weeks on 1 week of very well. -She has an appointment to see Dr. Norma Fredrickson on 01/02/2019.  I will see her back in 1 month for follow-up.  2.  Bone strengthening: - Zometa every 3 months started back on 09/06/2018. -She will receive her next dose today.  She will continue calcium and vitamin D supplements.

## 2018-12-10 NOTE — Progress Notes (Signed)
Pt presents today for Zometa and f/u visit with Dr. Delton Coombes. VS within parameters for treatment.   Zometa given today per MD orders. Tolerated infusion without adverse affects. Vital signs stable. No complaints at this time. Discharged from clinic ambulatory. F/U with [facility] as scheduled.

## 2018-12-11 LAB — KAPPA/LAMBDA LIGHT CHAINS
Kappa free light chain: 28.7 mg/L — ABNORMAL HIGH (ref 3.3–19.4)
Kappa, lambda light chain ratio: 1.44 (ref 0.26–1.65)
Lambda free light chains: 19.9 mg/L (ref 5.7–26.3)

## 2018-12-12 LAB — PROTEIN ELECTROPHORESIS, SERUM
A/G Ratio: 1 (ref 0.7–1.7)
Albumin ELP: 3.6 g/dL (ref 2.9–4.4)
Alpha-1-Globulin: 0.3 g/dL (ref 0.0–0.4)
Alpha-2-Globulin: 1.1 g/dL — ABNORMAL HIGH (ref 0.4–1.0)
Beta Globulin: 1.3 g/dL (ref 0.7–1.3)
Gamma Globulin: 1.1 g/dL (ref 0.4–1.8)
Globulin, Total: 3.7 g/dL (ref 2.2–3.9)
M-Spike, %: 0.3 g/dL — ABNORMAL HIGH
Total Protein ELP: 7.3 g/dL (ref 6.0–8.5)

## 2018-12-12 LAB — IMMUNOFIXATION ELECTROPHORESIS
IgA: 482 mg/dL — ABNORMAL HIGH (ref 64–422)
IgG (Immunoglobin G), Serum: 1219 mg/dL (ref 586–1602)
IgM (Immunoglobulin M), Srm: 24 mg/dL — ABNORMAL LOW (ref 26–217)
Total Protein ELP: 7.2 g/dL (ref 6.0–8.5)

## 2018-12-17 ENCOUNTER — Other Ambulatory Visit (HOSPITAL_COMMUNITY): Payer: Self-pay | Admitting: Nurse Practitioner

## 2018-12-17 DIAGNOSIS — E876 Hypokalemia: Secondary | ICD-10-CM

## 2018-12-18 ENCOUNTER — Other Ambulatory Visit (HOSPITAL_COMMUNITY): Payer: Self-pay | Admitting: Nurse Practitioner

## 2018-12-18 DIAGNOSIS — G479 Sleep disorder, unspecified: Secondary | ICD-10-CM

## 2018-12-21 ENCOUNTER — Other Ambulatory Visit (HOSPITAL_COMMUNITY): Payer: Self-pay | Admitting: Hematology

## 2018-12-21 DIAGNOSIS — C9001 Multiple myeloma in remission: Secondary | ICD-10-CM

## 2018-12-31 ENCOUNTER — Other Ambulatory Visit (HOSPITAL_COMMUNITY): Payer: Self-pay | Admitting: *Deleted

## 2018-12-31 DIAGNOSIS — C9001 Multiple myeloma in remission: Secondary | ICD-10-CM

## 2018-12-31 MED ORDER — LENALIDOMIDE 10 MG PO CAPS: 10.0000 mg | ORAL_CAPSULE | Freq: Every day | ORAL | 0 refills | Status: DC

## 2019-01-02 DIAGNOSIS — C9001 Multiple myeloma in remission: Secondary | ICD-10-CM | POA: Diagnosis not present

## 2019-01-02 DIAGNOSIS — Z79899 Other long term (current) drug therapy: Secondary | ICD-10-CM | POA: Diagnosis not present

## 2019-01-02 DIAGNOSIS — Z23 Encounter for immunization: Secondary | ICD-10-CM | POA: Diagnosis not present

## 2019-01-02 DIAGNOSIS — Z87891 Personal history of nicotine dependence: Secondary | ICD-10-CM | POA: Diagnosis not present

## 2019-01-02 DIAGNOSIS — M8580 Other specified disorders of bone density and structure, unspecified site: Secondary | ICD-10-CM | POA: Diagnosis not present

## 2019-01-02 DIAGNOSIS — Z9484 Stem cells transplant status: Secondary | ICD-10-CM | POA: Diagnosis not present

## 2019-01-02 DIAGNOSIS — F1021 Alcohol dependence, in remission: Secondary | ICD-10-CM | POA: Diagnosis not present

## 2019-01-08 ENCOUNTER — Other Ambulatory Visit (HOSPITAL_COMMUNITY): Payer: Self-pay | Admitting: *Deleted

## 2019-01-08 MED ORDER — ACYCLOVIR 800 MG PO TABS: 800.0000 mg | ORAL_TABLET | Freq: Two times a day (BID) | ORAL | 3 refills | Status: DC

## 2019-01-11 ENCOUNTER — Other Ambulatory Visit: Payer: Self-pay

## 2019-01-14 ENCOUNTER — Other Ambulatory Visit: Payer: Self-pay

## 2019-01-14 ENCOUNTER — Inpatient Hospital Stay (HOSPITAL_COMMUNITY): Payer: Medicare HMO

## 2019-01-14 ENCOUNTER — Inpatient Hospital Stay (HOSPITAL_COMMUNITY): Payer: Medicare HMO | Attending: Hematology | Admitting: Hematology

## 2019-01-14 ENCOUNTER — Other Ambulatory Visit (HOSPITAL_COMMUNITY): Payer: Self-pay

## 2019-01-14 ENCOUNTER — Encounter (HOSPITAL_COMMUNITY): Payer: Self-pay | Admitting: Hematology

## 2019-01-14 DIAGNOSIS — Z87891 Personal history of nicotine dependence: Secondary | ICD-10-CM | POA: Insufficient documentation

## 2019-01-14 DIAGNOSIS — C9000 Multiple myeloma not having achieved remission: Secondary | ICD-10-CM

## 2019-01-14 LAB — CBC WITH DIFFERENTIAL/PLATELET
Abs Immature Granulocytes: 0.02 10*3/uL (ref 0.00–0.07)
Basophils Absolute: 0.1 10*3/uL (ref 0.0–0.1)
Basophils Relative: 1 %
Eosinophils Absolute: 0.5 10*3/uL (ref 0.0–0.5)
Eosinophils Relative: 9 %
HCT: 38 % (ref 36.0–46.0)
Hemoglobin: 11.9 g/dL — ABNORMAL LOW (ref 12.0–15.0)
Immature Granulocytes: 0 %
Lymphocytes Relative: 34 %
Lymphs Abs: 1.9 10*3/uL (ref 0.7–4.0)
MCH: 30.8 pg (ref 26.0–34.0)
MCHC: 31.3 g/dL (ref 30.0–36.0)
MCV: 98.4 fL (ref 80.0–100.0)
Monocytes Absolute: 0.7 10*3/uL (ref 0.1–1.0)
Monocytes Relative: 13 %
Neutro Abs: 2.3 10*3/uL (ref 1.7–7.7)
Neutrophils Relative %: 43 %
Platelets: 264 10*3/uL (ref 150–400)
RBC: 3.86 MIL/uL — ABNORMAL LOW (ref 3.87–5.11)
RDW: 14.6 % (ref 11.5–15.5)
WBC: 5.4 10*3/uL (ref 4.0–10.5)
nRBC: 0 % (ref 0.0–0.2)

## 2019-01-14 LAB — COMPREHENSIVE METABOLIC PANEL
ALT: 18 U/L (ref 0–44)
AST: 16 U/L (ref 15–41)
Albumin: 3.7 g/dL (ref 3.5–5.0)
Alkaline Phosphatase: 64 U/L (ref 38–126)
Anion gap: 11 (ref 5–15)
BUN: 16 mg/dL (ref 8–23)
CO2: 25 mmol/L (ref 22–32)
Calcium: 9.6 mg/dL (ref 8.9–10.3)
Chloride: 99 mmol/L (ref 98–111)
Creatinine, Ser: 0.7 mg/dL (ref 0.44–1.00)
GFR calc Af Amer: >60 mL/min (ref >60)
GFR calc non Af Amer: >60 mL/min (ref >60)
Glucose, Bld: 125 mg/dL — ABNORMAL HIGH (ref 70–99)
Potassium: 3.8 mmol/L (ref 3.5–5.1)
Sodium: 135 mmol/L (ref 135–145)
Total Bilirubin: 0.4 mg/dL (ref 0.3–1.2)
Total Protein: 7.5 g/dL (ref 6.5–8.1)

## 2019-01-14 NOTE — Assessment & Plan Note (Signed)
1.  Stage II IgA kappa multiple myeloma, standard risk: -XRT to the spine, 20 Gray in 10 fractions completed on 11/10/2017 following T9-T10 laminectomy with resection of epidural tumor. -5 cycles of RVD from 11/14/2017 through 02/19/2018. -Auto stem cell transplant on 04/03/2018. - Bone marrow biopsy on 07/11/2018 shows normocellular marrow with less than 1% plasma cells.  MRD was positive in 0.0014% of plasma cells.  Free light chain ratio was normal.  IgA was mildly elevated at 393. -Revlimid maintenance 10 mg 3 weeks on 1 week off started on 08/24/2018. -Myeloma labs from 12/10/2018 shows M spike of 0.3 g/dL.  However I reviewed myeloma labs from [facility] from 01/02/2019 which showed no M spike. -Free light chain ratio was 1.44.  Kappa light chains were 28.7. -She will continue Revlimid maintenance at this time.  We will see her back in 4 weeks for follow-up.  2.  Bone strengthening: - Zometa every 3 months started back on 09/06/2018. -Last dose was on 12/10/2018.  She will continue calcium and D supplements.

## 2019-01-14 NOTE — Progress Notes (Signed)
[address], [place] [address]   CLINIC:  Medical Oncology/Hematology  PCP:  Asencion Noble, [place]  [address] [phone]   REASON FOR VISIT: Follow-up for multiple myeloma   BRIEF ONCOLOGIC HISTORY:  Oncology History  Multiple myeloma ([place])  10/10/2017 Initial Diagnosis   Multiple myeloma ([place])   11/14/2017 - 02/26/2018 Chemotherapy   The patient had bortezomib SQ (VELCADE) chemo injection 2.5 mg, 1.3 mg/m2 = 2.5 mg, Subcutaneous,  Once, 5 of 6 cycles Administration: 2.5 mg (11/14/2017), 2.5 mg (11/21/2017), 2.5 mg (11/28/2017), 2.5 mg (12/14/2017), 2.5 mg (12/21/2017), 2.5 mg (12/28/2017), 2.5 mg (01/04/2018), 2.5 mg (01/11/2018), 2.5 mg (01/18/2018), 2.5 mg (01/25/2018), 2.5 mg (02/01/2018), 2.5 mg (02/08/2018), 2.5 mg (02/19/2018), 2.5 mg (02/26/2018)  for chemotherapy treatment.       INTERVAL HISTORY:  Jamie Ewing 78 y.o. female seen for follow-up of multiple myeloma.  She is taking Revlimid 10 mg 3 weeks on 1 week off.  Denies any GI side effects including nausea, vomiting, diarrhea or constipation.  Appetite and energy levels are 100%.  No pain is reported.  She is walking with help of a cane.  She denies any fevers or infections in the last couple of months.  Denies any bleeding per rectum or melena.     REVIEW OF SYSTEMS:  Review of Systems  All other systems reviewed and are negative.    PAST MEDICAL/SURGICAL HISTORY:  Past Medical History:  Diagnosis Date  . Back pain   . Hypercholesteremia   . Hypertension    Past Surgical History:  Procedure Laterality Date  . BONE MARROW BIOPSY    . LAMINECTOMY N/A 10/06/2017   Procedure: THORACIC NINE AND TEN LAMINECTOMY WITH RESECTION OF TUMOR, THORACIC EIGHT TO THORACIC ELEVEN FUSION WITH PEDICLE SCREW FIXATION;  Surgeon: Earnie Larsson, MD;  Location: [facility];  Service: Neurosurgery;  Laterality: N/A;  . TOTAL KNEE ARTHROPLASTY  2001     SOCIAL HISTORY:  Social  History   Socioeconomic History  . Marital status: Widowed    Spouse name: Not on file  . Number of children: 2  . Years of education: Not on file  . Highest education level: Not on file  Occupational History  . Not on file  Social Needs  . Financial resource strain: Not very hard  . Food insecurity    Worry: Never true    Inability: Never true  . Transportation needs    Medical: No    Non-medical: No  Tobacco Use  . Smoking status: Former Smoker    Packs/day: 0.50    Years: 30.00    Pack years: 15.00    Types: Cigarettes    Quit date: 05/31/2006    Years since quitting: 12.6  . Smokeless tobacco: Never Used  Substance and Sexual Activity  . Alcohol use: Yes    Comment: wine occassionally  . Drug use: No  . Sexual activity: Not on file  Lifestyle  . Physical activity    Days per week: 4 days    Minutes per session: 120 min  . Stress: Not at all  Relationships  . Social connections    Talks on phone: More than three times a week    Gets together: Once a week    Attends religious service: More than 4 times per year    Active member of club or organization: Yes    Attends meetings of clubs or organizations: More than 4 times  per year    Relationship status: Widowed  . Intimate partner violence    Fear of current or ex partner: No    Emotionally abused: No    Physically abused: No    Forced sexual activity: No  Other Topics Concern  . Not on file  Social History Narrative  . Not on file    FAMILY HISTORY:  Family History  Problem Relation Age of Onset  . Tuberculosis Mother   . Kidney failure Father   . Hypertension Paternal Aunt   . Diabetes Paternal Aunt   . Hypertension Paternal Uncle   . Diabetes Paternal Uncle   . Hypertension Daughter   . Stroke Daughter     CURRENT MEDICATIONS:  Outpatient Encounter Medications as of 01/14/2019  Medication Sig  . acyclovir (ZOVIRAX) 800 MG tablet Take 1 tablet (800 mg total) by mouth 2 (two) times daily.  [name]  amLODipine (NORVASC) 5 MG tablet Take 5 mg by mouth daily.   [name] aspirin 81 MG chewable tablet Chew 81 mg by mouth daily.  . calcium carbonate (CALCIUM 600) 1500 (600 Ca) MG TABS tablet Take by mouth.  [name] CALCIUM-VITAMIN D PO Take 1 tablet by mouth daily.  . hydrochlorothiazide (HYDRODIURIL) 25 MG tablet Take 25 mg by mouth daily.  [name] KLOR-CON M20 20 MEQ tablet TAKE 1 TABLET BY MOUTH TWICE A DAY  . lenalidomide (REVLIMID) 10 MG capsule Take 1 capsule (10 mg total) by mouth daily.  . polyethylene glycol (MIRALAX / GLYCOLAX) packet Take 17 g by mouth as needed.   . temazepam (RESTORIL) 15 MG capsule TAKE 1 CAPSULE BY MOUTH AT BEDTIME AS NEEDED FOR SLEEP  . [DISCONTINUED] REVLIMID 10 MG capsule TAKE 1 CAPSULE ('10MG'$  TOTAL) BY MOUTH EVERY DAY   No facility-administered encounter medications on file as of 01/14/2019.     ALLERGIES:  No Known Allergies   PHYSICAL EXAM:  ECOG Performance status: 1  Vitals:   01/14/19 1149  BP: (!) 140/59  Pulse: 81  Resp: 18  Temp: (!) 97.1 F (36.2 C)  SpO2: 100%   Filed Weights   01/14/19 1149  Weight: 168 lb 4.8 oz (76.3 kg)    Physical Exam Constitutional:      Appearance: Normal appearance. She is normal weight.  Cardiovascular:     Rate and Rhythm: Normal rate and regular rhythm.     Heart sounds: Normal heart sounds.  Pulmonary:     Effort: Pulmonary effort is normal.     Breath sounds: Normal breath sounds.  Abdominal:     General: Bowel sounds are normal.     Palpations: Abdomen is soft.  Musculoskeletal: Normal range of motion.  Skin:    General: Skin is warm and dry.  Neurological:     Mental Status: She is alert and oriented to person, place, and time. Mental status is at baseline.  Psychiatric:        Mood and Affect: Mood normal.        Behavior: Behavior normal.        Thought Content: Thought content normal.        Judgment: Judgment normal.      LABORATORY DATA:  I have reviewed the labs as listed.  CBC     Component Value Date/Time   WBC 5.4 01/14/2019 1105   RBC 3.86 (L) 01/14/2019 1105   HGB 11.9 (L) 01/14/2019 1105   HCT 38.0 01/14/2019 1105   PLT 264 01/14/2019 1105   MCV 98.4 01/14/2019 1105  MCH 30.8 01/14/2019 1105   MCHC 31.3 01/14/2019 1105   RDW 14.6 01/14/2019 1105   LYMPHSABS 1.9 01/14/2019 1105   MONOABS 0.7 01/14/2019 1105   EOSABS 0.5 01/14/2019 1105   BASOSABS 0.1 01/14/2019 1105   CMP Latest Ref Rng & Units 01/14/2019 12/10/2018 11/08/2018  Glucose 70 - 99 mg/dL 125(H) 130(H) 92  BUN 8 - 23 mg/dL '16 18 12  '$ Creatinine 0.44 - 1.00 mg/dL 0.70 0.73 0.87  Sodium 135 - 145 mmol/L 135 139 134(L)  Potassium 3.5 - 5.1 mmol/L 3.8 4.2 3.5  Chloride 98 - 111 mmol/L 99 104 102  CO2 22 - 32 mmol/L '25 27 22  '$ Calcium 8.9 - 10.3 mg/dL 9.6 9.6 9.1  Total Protein 6.5 - 8.1 g/dL 7.5 7.6 7.8  Total Bilirubin 0.3 - 1.2 mg/dL 0.4 0.2(L) 0.4  Alkaline Phos 38 - 126 U/L 64 82 73  AST 15 - 41 U/L '16 20 19  '$ ALT 0 - 44 U/L '18 23 19       '$ DIAGNOSTIC IMAGING:  I have independently reviewed the scans and discussed with the patient.   ASSESSMENT & PLAN:   Multiple myeloma (HCC) 1.  Stage II IgA kappa multiple myeloma, standard risk: -XRT to the spine, 20 Gray in 10 fractions completed on 11/10/2017 following T9-T10 laminectomy with resection of epidural tumor. -5 cycles of RVD from 11/14/2017 through 02/19/2018. -Auto stem cell transplant on 04/03/2018. - Bone marrow biopsy on 07/11/2018 shows normocellular marrow with less than 1% plasma cells.  MRD was positive in 0.0014% of plasma cells.  Free light chain ratio was normal.  IgA was mildly elevated at 393. -Revlimid maintenance 10 mg 3 weeks on 1 week off started on 08/24/2018. -Myeloma labs from 12/10/2018 shows M spike of 0.3 g/dL.  However I reviewed myeloma labs from [facility] from 01/02/2019 which showed no M spike. -Free light chain ratio was 1.44.  Kappa light chains were 28.7. -She will continue Revlimid maintenance at  this time.  We will see her back in 4 weeks for follow-up.  2.  Bone strengthening: - Zometa every 3 months started back on 09/06/2018. -Last dose was on 12/10/2018.  She will continue calcium and D supplements.      Total time spent is 25 minutes with more than 50% of the time spent face-to-face discussing treatment plan, counseling and coordination of care.  Orders placed this encounter:  Orders Placed This Encounter  Procedures  . CBC with Differential/Platelet  . Comprehensive metabolic panel  . Protein electrophoresis, serum  . Kappa/lambda light chains  . Lactate dehydrogenase  . Immunofixation electrophoresis      Derek Jack, MD [place] [phone]

## 2019-01-14 NOTE — Patient Instructions (Signed)
[facility] at [facility] Discharge Instructions  You were seen today by Dr. Katragadda. He went over your recent lab results. He will see you back in 4 weeks for labs and follow up.   Thank you for choosing [facility] at [facility] to provide your oncology and hematology care.  To afford each patient quality time with our provider, please arrive at least 15 minutes before your scheduled appointment time.   If you have a lab appointment with the Cancer Center please come in thru the  Main Entrance and check in at the main information desk  You need to re-schedule your appointment should you arrive 10 or more minutes late.  We strive to give you quality time with our providers, and arriving late affects you and other patients whose appointments are after yours.  Also, if you no show three or more times for appointments you may be dismissed from the clinic at the providers discretion.     Again, thank you for choosing [facility].  Our hope is that these requests will decrease the amount of time that you wait before being seen by our physicians.       _____________________________________________________________  Should you have questions after your visit to [facility], please contact our office at [phone] between the hours of 8:00 a.m. and 4:30 p.m.  Voicemails left after 4:00 p.m. will not be returned until the following business day.  For prescription refill requests, have your pharmacy contact our office and allow 72 hours.    Cancer Center Support Programs:   > Cancer Support Group  2nd Tuesday of the month 1pm-2pm, Journey Room    

## 2019-01-18 ENCOUNTER — Other Ambulatory Visit (HOSPITAL_COMMUNITY): Payer: Self-pay | Admitting: Nurse Practitioner

## 2019-01-18 DIAGNOSIS — G479 Sleep disorder, unspecified: Secondary | ICD-10-CM

## 2019-01-20 ENCOUNTER — Other Ambulatory Visit (HOSPITAL_COMMUNITY): Payer: Self-pay | Admitting: Hematology

## 2019-01-20 DIAGNOSIS — C9001 Multiple myeloma in remission: Secondary | ICD-10-CM

## 2019-02-04 ENCOUNTER — Inpatient Hospital Stay (HOSPITAL_COMMUNITY): Payer: Medicare HMO | Attending: Hematology

## 2019-02-04 ENCOUNTER — Other Ambulatory Visit: Payer: Self-pay

## 2019-02-04 DIAGNOSIS — M25511 Pain in right shoulder: Secondary | ICD-10-CM | POA: Diagnosis not present

## 2019-02-04 DIAGNOSIS — C9000 Multiple myeloma not having achieved remission: Secondary | ICD-10-CM | POA: Diagnosis not present

## 2019-02-04 DIAGNOSIS — Z87891 Personal history of nicotine dependence: Secondary | ICD-10-CM | POA: Insufficient documentation

## 2019-02-04 LAB — COMPREHENSIVE METABOLIC PANEL
ALT: 19 U/L (ref 0–44)
AST: 16 U/L (ref 15–41)
Albumin: 3.7 g/dL (ref 3.5–5.0)
Alkaline Phosphatase: 69 U/L (ref 38–126)
Anion gap: 10 (ref 5–15)
BUN: 18 mg/dL (ref 8–23)
CO2: 24 mmol/L (ref 22–32)
Calcium: 9.3 mg/dL (ref 8.9–10.3)
Chloride: 101 mmol/L (ref 98–111)
Creatinine, Ser: 0.8 mg/dL (ref 0.44–1.00)
GFR calc Af Amer: >60 mL/min (ref >60)
GFR calc non Af Amer: >60 mL/min (ref >60)
Glucose, Bld: 108 mg/dL — ABNORMAL HIGH (ref 70–99)
Potassium: 4 mmol/L (ref 3.5–5.1)
Sodium: 135 mmol/L (ref 135–145)
Total Bilirubin: 0.6 mg/dL (ref 0.3–1.2)
Total Protein: 7.5 g/dL (ref 6.5–8.1)

## 2019-02-04 LAB — CBC WITH DIFFERENTIAL/PLATELET
Abs Immature Granulocytes: 0.01 10*3/uL (ref 0.00–0.07)
Basophils Absolute: 0 10*3/uL (ref 0.0–0.1)
Basophils Relative: 1 %
Eosinophils Absolute: 0.5 10*3/uL (ref 0.0–0.5)
Eosinophils Relative: 9 %
HCT: 36.5 % (ref 36.0–46.0)
Hemoglobin: 11.7 g/dL — ABNORMAL LOW (ref 12.0–15.0)
Immature Granulocytes: 0 %
Lymphocytes Relative: 39 %
Lymphs Abs: 2 10*3/uL (ref 0.7–4.0)
MCH: 31.2 pg (ref 26.0–34.0)
MCHC: 32.1 g/dL (ref 30.0–36.0)
MCV: 97.3 fL (ref 80.0–100.0)
Monocytes Absolute: 0.8 10*3/uL (ref 0.1–1.0)
Monocytes Relative: 16 %
Neutro Abs: 1.8 10*3/uL (ref 1.7–7.7)
Neutrophils Relative %: 35 %
Platelets: 273 10*3/uL (ref 150–400)
RBC: 3.75 MIL/uL — ABNORMAL LOW (ref 3.87–5.11)
RDW: 14.8 % (ref 11.5–15.5)
WBC: 5.1 10*3/uL (ref 4.0–10.5)
nRBC: 0 % (ref 0.0–0.2)

## 2019-02-04 LAB — LACTATE DEHYDROGENASE: LDH: 117 U/L (ref 98–192)

## 2019-02-05 LAB — PROTEIN ELECTROPHORESIS, SERUM
A/G Ratio: 0.9 (ref 0.7–1.7)
Albumin ELP: 3.4 g/dL (ref 2.9–4.4)
Alpha-1-Globulin: 0.3 g/dL (ref 0.0–0.4)
Alpha-2-Globulin: 0.8 g/dL (ref 0.4–1.0)
Beta Globulin: 1.3 g/dL (ref 0.7–1.3)
Gamma Globulin: 1.2 g/dL (ref 0.4–1.8)
Globulin, Total: 3.6 g/dL (ref 2.2–3.9)
Total Protein ELP: 7 g/dL (ref 6.0–8.5)

## 2019-02-05 LAB — KAPPA/LAMBDA LIGHT CHAINS
Kappa free light chain: 30.4 mg/L — ABNORMAL HIGH (ref 3.3–19.4)
Kappa, lambda light chain ratio: 1.46 (ref 0.26–1.65)
Lambda free light chains: 20.8 mg/L (ref 5.7–26.3)

## 2019-02-06 LAB — IMMUNOFIXATION ELECTROPHORESIS
IgA: 518 mg/dL — ABNORMAL HIGH (ref 64–422)
IgG (Immunoglobin G), Serum: 1269 mg/dL (ref 586–1602)
IgM (Immunoglobulin M), Srm: 21 mg/dL — ABNORMAL LOW (ref 26–217)
Total Protein ELP: 6.9 g/dL (ref 6.0–8.5)

## 2019-02-11 ENCOUNTER — Encounter (HOSPITAL_COMMUNITY): Payer: Self-pay | Admitting: Hematology

## 2019-02-11 ENCOUNTER — Other Ambulatory Visit (HOSPITAL_COMMUNITY): Payer: Self-pay | Admitting: *Deleted

## 2019-02-11 ENCOUNTER — Other Ambulatory Visit: Payer: Self-pay

## 2019-02-11 ENCOUNTER — Inpatient Hospital Stay (HOSPITAL_BASED_OUTPATIENT_CLINIC_OR_DEPARTMENT_OTHER): Payer: Medicare HMO | Admitting: Hematology

## 2019-02-11 ENCOUNTER — Telehealth (HOSPITAL_COMMUNITY): Payer: Self-pay | Admitting: Hematology

## 2019-02-11 VITALS — BP 117/69 | HR 90 | Temp 96.9°F | Resp 18 | Wt 169.5 lb

## 2019-02-11 DIAGNOSIS — M25511 Pain in right shoulder: Secondary | ICD-10-CM | POA: Diagnosis not present

## 2019-02-11 DIAGNOSIS — C9000 Multiple myeloma not having achieved remission: Secondary | ICD-10-CM | POA: Diagnosis not present

## 2019-02-11 DIAGNOSIS — Z87891 Personal history of nicotine dependence: Secondary | ICD-10-CM | POA: Diagnosis not present

## 2019-02-11 DIAGNOSIS — C9001 Multiple myeloma in remission: Secondary | ICD-10-CM

## 2019-02-11 MED ORDER — LENALIDOMIDE 10 MG PO CAPS: 10.0000 mg | ORAL_CAPSULE | Freq: Every day | ORAL | 0 refills | Status: DC

## 2019-02-11 NOTE — Patient Instructions (Addendum)
[place] at [facility] Discharge Instructions  You were seen today by Dr. Delton Coombes. He went over your recent lab results. He will see you back in 1 month for labs, infusion and follow up.   Thank you for choosing [place] at [facility] to provide your oncology and hematology care.  To afford each patient quality time with our provider, please arrive at least 15 minutes before your scheduled appointment time.   If you have a lab appointment with the [place] please come in thru the  Main Entrance and check in at the main information desk  You need to re-schedule your appointment should you arrive 10 or more minutes late.  We strive to give you quality time with our providers, and arriving late affects you and other patients whose appointments are after yours.  Also, if you no show three or more times for appointments you may be dismissed from the clinic at the providers discretion.     Again, thank you for choosing [facility].  Our hope is that these requests will decrease the amount of time that you wait before being seen by our physicians.       _____________________________________________________________  Should you have questions after your visit to [facility], please contact our office at [phone] between the hours of 8:00 a.m. and 4:30 p.m.  Voicemails left after 4:00 p.m. will not be returned until the following business day.  For prescription refill requests, have your pharmacy contact our office and allow 72 hours.    Cancer Center Support Programs:   > Cancer Support Group  2nd Tuesday of the month 1pm-2pm, Journey Room

## 2019-02-11 NOTE — Assessment & Plan Note (Signed)
1.  Stage II IgA kappa multiple myeloma, standard risk: -XRT to the spine, 20 Gray in 10 fractions completed on 11/10/2017 following T9-T10 laminectomy with resection of epidural tumor. -5 cycles of RVD from 11/14/2017 through 02/19/2018. -Auto stem cell transplant on 04/03/2018. - Bone marrow biopsy on 07/11/2018 shows normocellular marrow with less than 1% plasma cells.  MRD was positive in 0.0014% of plasma cells.  Free light chain ratio was normal.  IgA was mildly elevated at 393. -Revlimid maintenance 10 mg 3 weeks on 1 week off started on 08/24/2018. -Myeloma labs from 02/04/2019 shows no M spike.  Immunofixation was polyclonal.  Kappa light chains were 30.4, lambda light chains 20.5 and ratio of 1.46.  Routine labs were also normal. -She is tolerating Revlimid very well without any side effects. -She will come back in 1 month for follow-up with repeat labs.  She has an appointment to be seen at [facility] in January.  2.  Bone strengthening: - Zometa every 3 months started back on 09/06/2018. -Last Zometa on 12/10/2018.  She will continue calcium and D supplements.

## 2019-02-11 NOTE — Progress Notes (Signed)
[place], [place] [address]   CLINIC:  Medical Oncology/Hematology  PCP:  Asencion Noble, [place] [address] [phone]   REASON FOR VISIT: Follow-up for multiple myeloma   BRIEF ONCOLOGIC HISTORY:  Oncology History  Multiple myeloma ([place])  10/10/2017 Initial Diagnosis   Multiple myeloma ([place] [place])   11/14/2017 - 02/26/2018 Chemotherapy   The patient had bortezomib SQ (VELCADE) chemo injection 2.5 mg, 1.3 mg/m2 = 2.5 mg, Subcutaneous,  Once, 5 of 6 cycles Administration: 2.5 mg (11/14/2017), 2.5 mg (11/21/2017), 2.5 mg (11/28/2017), 2.5 mg (12/14/2017), 2.5 mg (12/21/2017), 2.5 mg (12/28/2017), 2.5 mg (01/04/2018), 2.5 mg (01/11/2018), 2.5 mg (01/18/2018), 2.5 mg (01/25/2018), 2.5 mg (02/01/2018), 2.5 mg (02/08/2018), 2.5 mg (02/19/2018), 2.5 mg (02/26/2018)  for chemotherapy treatment.       INTERVAL HISTORY:  Ms. Siegrist 78 y.o. female seen for follow-up of multiple myeloma.  She is tolerating Revlimid 10 mg 3 weeks on 1 week off very well.  Denies any nausea, vomiting, diarrhea or constipation.  No new onset pains reported.  Right shoulder pain of few months duration is stable.  Appetite is 100%.  Energy levels are 75%.  Denies any recurrent infections or hospitalizations.  No fevers reported.     REVIEW OF SYSTEMS:  Review of Systems  All other systems reviewed and are negative.    PAST MEDICAL/SURGICAL HISTORY:  Past Medical History:  Diagnosis Date  . Back pain   . Hypercholesteremia   . Hypertension    Past Surgical History:  Procedure Laterality Date  . BONE MARROW BIOPSY    . LAMINECTOMY N/A 10/06/2017   Procedure: THORACIC NINE AND TEN LAMINECTOMY WITH RESECTION OF TUMOR, THORACIC EIGHT TO THORACIC ELEVEN FUSION WITH PEDICLE SCREW FIXATION;  Surgeon: Earnie Larsson, MD;  Location: [facility];  Service: Neurosurgery;  Laterality: N/A;  . TOTAL KNEE ARTHROPLASTY  2001     SOCIAL HISTORY:  Social History    Socioeconomic History  . Marital status: Widowed    Spouse name: Not on file  . Number of children: 2  . Years of education: Not on file  . Highest education level: Not on file  Occupational History  . Not on file  Social Needs  . Financial resource strain: Not very hard  . Food insecurity    Worry: Never true    Inability: Never true  . Transportation needs    Medical: No    Non-medical: No  Tobacco Use  . Smoking status: Former Smoker    Packs/day: 0.50    Years: 30.00    Pack years: 15.00    Types: Cigarettes    Quit date: 05/31/2006    Years since quitting: 12.7  . Smokeless tobacco: Never Used  Substance and Sexual Activity  . Alcohol use: Yes    Comment: wine occassionally  . Drug use: No  . Sexual activity: Not on file  Lifestyle  . Physical activity    Days per week: 4 days    Minutes per session: 120 min  . Stress: Not at all  Relationships  . Social connections    Talks on phone: More than three times a week    Gets together: Once a week    Attends religious service: More than 4 times per year    Active member of club or organization: Yes    Attends meetings of clubs or organizations: More than 4 times per year    Relationship status: Widowed  .  Intimate partner violence    Fear of current or ex partner: No    Emotionally abused: No    Physically abused: No    Forced sexual activity: No  Other Topics Concern  . Not on file  Social History Narrative  . Not on file    FAMILY HISTORY:  Family History  Problem Relation Age of Onset  . Tuberculosis Mother   . Kidney failure Father   . Hypertension Paternal Aunt   . Diabetes Paternal Aunt   . Hypertension Paternal Uncle   . Diabetes Paternal Uncle   . Hypertension Daughter   . Stroke Daughter     CURRENT MEDICATIONS:  Outpatient Encounter Medications as of 02/11/2019  Medication Sig  . acyclovir (ZOVIRAX) 800 MG tablet Take 1 tablet (800 mg total) by mouth 2 (two) times daily.  [name] amLODipine  (NORVASC) 5 MG tablet Take 5 mg by mouth daily.   [name] aspirin 81 MG chewable tablet Chew 81 mg by mouth daily.  . calcium carbonate (CALCIUM 600) 1500 (600 Ca) MG TABS tablet Take by mouth.  . hydrochlorothiazide (HYDRODIURIL) 25 MG tablet Take 25 mg by mouth daily.  [name] KLOR-CON M20 20 MEQ tablet TAKE 1 TABLET BY MOUTH TWICE A DAY  . lenalidomide (REVLIMID) 10 MG capsule Take 1 capsule (10 mg total) by mouth daily.  . polyethylene glycol (MIRALAX / GLYCOLAX) packet Take 17 g by mouth as needed.   . temazepam (RESTORIL) 15 MG capsule TAKE 1 CAPSULE BY MOUTH AT BEDTIME AS NEEDED FOR SLEEP  . [DISCONTINUED] CALCIUM-VITAMIN D PO Take 1 tablet by mouth daily.  . [DISCONTINUED] REVLIMID 10 MG capsule TAKE 1 CAPSULE ('10MG'$  TOTAL) BY MOUTH EVERY DAY  . [DISCONTINUED] REVLIMID 10 MG capsule TAKE 1 CAPSULE BY MOUTH EVERY DAY   No facility-administered encounter medications on file as of 02/11/2019.     ALLERGIES:  No Known Allergies   PHYSICAL EXAM:  ECOG Performance status: 1  Vitals:   02/11/19 1503  BP: 117/69  Pulse: 90  Resp: 18  Temp: (!) 96.9 F (36.1 C)  SpO2: 100%   Filed Weights   02/11/19 1503  Weight: 169 lb 8 oz (76.9 kg)    Physical Exam Constitutional:      Appearance: Normal appearance. She is normal weight.  Cardiovascular:     Rate and Rhythm: Normal rate and regular rhythm.     Heart sounds: Normal heart sounds.  Pulmonary:     Effort: Pulmonary effort is normal.     Breath sounds: Normal breath sounds.  Abdominal:     General: Bowel sounds are normal.     Palpations: Abdomen is soft.  Musculoskeletal: Normal range of motion.  Skin:    General: Skin is warm and dry.  Neurological:     Mental Status: She is alert and oriented to person, place, and time. Mental status is at baseline.  Psychiatric:        Mood and Affect: Mood normal.        Behavior: Behavior normal.        Thought Content: Thought content normal.        Judgment: Judgment normal.       LABORATORY DATA:  I have reviewed the labs as listed.  CBC    Component Value Date/Time   WBC 5.1 02/04/2019 1223   RBC 3.75 (L) 02/04/2019 1223   HGB 11.7 (L) 02/04/2019 1223   HCT 36.5 02/04/2019 1223   PLT 273 02/04/2019 1223  MCV 97.3 02/04/2019 1223   MCH 31.2 02/04/2019 1223   MCHC 32.1 02/04/2019 1223   RDW 14.8 02/04/2019 1223   LYMPHSABS 2.0 02/04/2019 1223   MONOABS 0.8 02/04/2019 1223   EOSABS 0.5 02/04/2019 1223   BASOSABS 0.0 02/04/2019 1223   CMP Latest Ref Rng & Units 02/04/2019 01/14/2019 12/10/2018  Glucose 70 - 99 mg/dL 108(H) 125(H) 130(H)  BUN 8 - 23 mg/dL '18 16 18  '$ Creatinine 0.44 - 1.00 mg/dL 0.80 0.70 0.73  Sodium 135 - 145 mmol/L 135 135 139  Potassium 3.5 - 5.1 mmol/L 4.0 3.8 4.2  Chloride 98 - 111 mmol/L 101 99 104  CO2 22 - 32 mmol/L '24 25 27  '$ Calcium 8.9 - 10.3 mg/dL 9.3 9.6 9.6  Total Protein 6.5 - 8.1 g/dL 7.5 7.5 7.6  Total Bilirubin 0.3 - 1.2 mg/dL 0.6 0.4 0.2(L)  Alkaline Phos 38 - 126 U/L 69 64 82  AST 15 - 41 U/L '16 16 20  '$ ALT 0 - 44 U/L '19 18 23       '$ DIAGNOSTIC IMAGING:  I have independently reviewed the scans and discussed with the patient.   ASSESSMENT & PLAN:   Multiple myeloma (H[facility]) 1.  Stage II IgA kappa multiple myeloma, standard risk: -XRT to the spine, 20 Gray in 10 fractions completed on 11/10/2017 following T9-T10 laminectomy with resection of epidural tumor. -5 cycles of RVD from 11/14/2017 through 02/19/2018. -Auto stem cell transplant on 04/03/2018. - Bone marrow biopsy on 07/11/2018 shows normocellular marrow with less than 1% plasma cells.  MRD was positive in 0.0014% of plasma cells.  Free light chain ratio was normal.  IgA was mildly elevated at 393. -Revlimid maintenance 10 mg 3 weeks on 1 week off started on 08/24/2018. -Myeloma labs from 02/04/2019 shows no M spike.  Immunofixation was polyclonal.  Kappa light chains were 30.4, lambda light chains 20.5 and ratio of 1.46.  Routine labs were also normal. -She is  tolerating Revlimid very well without any side effects. -She will come back in 1 month for follow-up with repeat labs.  She has an appointment to be seen at [facility] in January.  2.  Bone strengthening: - Zometa every 3 months started back on 09/06/2018. -Last Zometa on 12/10/2018.  She will continue calcium and D supplements.     Orders placed this encounter:  Orders Placed This Encounter  Procedures  . Protein electrophoresis, serum  . Kappa/lambda light chains  . Lactate dehydrogenase  . CBC with Differential/Platelet  . Comprehensive metabolic panel  . Immunofixation electrophoresis      Derek Jack, MD [place] [phone]

## 2019-02-11 NOTE — Telephone Encounter (Signed)
Faxed Revlimid rx to [facility]

## 2019-02-17 ENCOUNTER — Other Ambulatory Visit (HOSPITAL_COMMUNITY): Payer: Self-pay | Admitting: Nurse Practitioner

## 2019-02-17 DIAGNOSIS — G479 Sleep disorder, unspecified: Secondary | ICD-10-CM

## 2019-03-01 ENCOUNTER — Inpatient Hospital Stay (HOSPITAL_COMMUNITY): Payer: Medicare HMO | Attending: Hematology

## 2019-03-01 ENCOUNTER — Other Ambulatory Visit: Payer: Self-pay

## 2019-03-01 DIAGNOSIS — C9000 Multiple myeloma not having achieved remission: Secondary | ICD-10-CM | POA: Insufficient documentation

## 2019-03-01 LAB — CBC WITH DIFFERENTIAL/PLATELET
Abs Immature Granulocytes: 0.01 10*3/uL (ref 0.00–0.07)
Basophils Absolute: 0 10*3/uL (ref 0.0–0.1)
Basophils Relative: 1 %
Eosinophils Absolute: 0.2 10*3/uL (ref 0.0–0.5)
Eosinophils Relative: 5 %
HCT: 39 % (ref 36.0–46.0)
Hemoglobin: 12.6 g/dL (ref 12.0–15.0)
Immature Granulocytes: 0 %
Lymphocytes Relative: 41 %
Lymphs Abs: 1.6 10*3/uL (ref 0.7–4.0)
MCH: 32.1 pg (ref 26.0–34.0)
MCHC: 32.3 g/dL (ref 30.0–36.0)
MCV: 99.2 fL (ref 80.0–100.0)
Monocytes Absolute: 0.7 10*3/uL (ref 0.1–1.0)
Monocytes Relative: 17 %
Neutro Abs: 1.4 10*3/uL — ABNORMAL LOW (ref 1.7–7.7)
Neutrophils Relative %: 36 %
Platelets: 259 10*3/uL (ref 150–400)
RBC: 3.93 MIL/uL (ref 3.87–5.11)
RDW: 15.1 % (ref 11.5–15.5)
WBC: 3.9 10*3/uL — ABNORMAL LOW (ref 4.0–10.5)
nRBC: 0 % (ref 0.0–0.2)

## 2019-03-01 LAB — LACTATE DEHYDROGENASE: LDH: 123 U/L (ref 98–192)

## 2019-03-01 LAB — COMPREHENSIVE METABOLIC PANEL
ALT: 23 U/L (ref 0–44)
AST: 21 U/L (ref 15–41)
Albumin: 3.8 g/dL (ref 3.5–5.0)
Alkaline Phosphatase: 70 U/L (ref 38–126)
Anion gap: 12 (ref 5–15)
BUN: 13 mg/dL (ref 8–23)
CO2: 22 mmol/L (ref 22–32)
Calcium: 9.1 mg/dL (ref 8.9–10.3)
Chloride: 102 mmol/L (ref 98–111)
Creatinine, Ser: 0.84 mg/dL (ref 0.44–1.00)
GFR calc Af Amer: >60 mL/min (ref >60)
GFR calc non Af Amer: >60 mL/min (ref >60)
Glucose, Bld: 154 mg/dL — ABNORMAL HIGH (ref 70–99)
Potassium: 3.7 mmol/L (ref 3.5–5.1)
Sodium: 136 mmol/L (ref 135–145)
Total Bilirubin: 0.4 mg/dL (ref 0.3–1.2)
Total Protein: 8.2 g/dL — ABNORMAL HIGH (ref 6.5–8.1)

## 2019-03-04 ENCOUNTER — Encounter (HOSPITAL_COMMUNITY): Payer: Self-pay | Admitting: Hematology

## 2019-03-04 ENCOUNTER — Inpatient Hospital Stay (HOSPITAL_BASED_OUTPATIENT_CLINIC_OR_DEPARTMENT_OTHER): Payer: Medicare HMO | Admitting: Hematology

## 2019-03-04 ENCOUNTER — Inpatient Hospital Stay (HOSPITAL_COMMUNITY): Payer: Medicare HMO

## 2019-03-04 ENCOUNTER — Other Ambulatory Visit (HOSPITAL_COMMUNITY): Payer: Medicare HMO

## 2019-03-04 ENCOUNTER — Other Ambulatory Visit: Payer: Self-pay

## 2019-03-04 DIAGNOSIS — C9000 Multiple myeloma not having achieved remission: Secondary | ICD-10-CM

## 2019-03-04 DIAGNOSIS — C9001 Multiple myeloma in remission: Secondary | ICD-10-CM

## 2019-03-04 LAB — PROTEIN ELECTROPHORESIS, SERUM
A/G Ratio: 1 (ref 0.7–1.7)
Albumin ELP: 3.8 g/dL (ref 2.9–4.4)
Alpha-1-Globulin: 0.3 g/dL (ref 0.0–0.4)
Alpha-2-Globulin: 0.9 g/dL (ref 0.4–1.0)
Beta Globulin: 1.4 g/dL — ABNORMAL HIGH (ref 0.7–1.3)
Gamma Globulin: 1.2 g/dL (ref 0.4–1.8)
Globulin, Total: 3.8 g/dL (ref 2.2–3.9)
Total Protein ELP: 7.6 g/dL (ref 6.0–8.5)

## 2019-03-04 LAB — KAPPA/LAMBDA LIGHT CHAINS
Kappa free light chain: 36.3 mg/L — ABNORMAL HIGH (ref 3.3–19.4)
Kappa, lambda light chain ratio: 1.45 (ref 0.26–1.65)
Lambda free light chains: 25 mg/L (ref 5.7–26.3)

## 2019-03-04 MED ORDER — ZOLEDRONIC ACID 4 MG/100ML IV SOLN
4.0000 mg | Freq: Once | INTRAVENOUS | Status: AC
  Administered 2019-03-04: 4 mg via INTRAVENOUS
  Filled 2019-03-04: qty 100

## 2019-03-04 MED ORDER — SODIUM CHLORIDE 0.9% FLUSH
10.0000 mL | Freq: Once | INTRAVENOUS | Status: AC
  Administered 2019-03-04: 10:00:00 10 mL via INTRAVENOUS

## 2019-03-04 MED ORDER — SODIUM CHLORIDE 0.9 % IV SOLN
Freq: Once | INTRAVENOUS | Status: AC
  Administered 2019-03-04: 10:00:00 via INTRAVENOUS

## 2019-03-04 MED ORDER — ZOLEDRONIC ACID 4 MG/5ML IV CONC: 4.0000 mg | Freq: Once | INTRAVENOUS | Status: DC

## 2019-03-04 NOTE — Progress Notes (Signed)
[name], [name] [address]   CLINIC:  Medical Oncology/Hematology  PCP:  Asencion Noble, [place] [address] [phone]   REASON FOR VISIT: Follow-up for multiple myeloma   BRIEF ONCOLOGIC HISTORY:  Oncology History  Multiple myeloma ([place])  10/10/2017 Initial Diagnosis   Multiple myeloma ([facility])   11/14/2017 - 02/26/2018 Chemotherapy   The patient had bortezomib SQ (VELCADE) chemo injection 2.5 mg, 1.3 mg/m2 = 2.5 mg, Subcutaneous,  Once, 5 of 6 cycles Administration: 2.5 mg (11/14/2017), 2.5 mg (11/21/2017), 2.5 mg (11/28/2017), 2.5 mg (12/14/2017), 2.5 mg (12/21/2017), 2.5 mg (12/28/2017), 2.5 mg (01/04/2018), 2.5 mg (01/11/2018), 2.5 mg (01/18/2018), 2.5 mg (01/25/2018), 2.5 mg (02/01/2018), 2.5 mg (02/08/2018), 2.5 mg (02/19/2018), 2.5 mg (02/26/2018)  for chemotherapy treatment.       INTERVAL HISTORY:  [name] 78 y.o. female seen for follow-up of multiple myeloma.  She is taking Revlimid 10 mg 3 weeks on 1 week off.  Appetite and energy levels are 100%.  Denies any nausea, vomiting, diarrhea or constipation.  No tingling or numbness in the extremities reported.  No new onset pains.  Denies any fevers or chills.     REVIEW OF SYSTEMS:  Review of Systems  All other systems reviewed and are negative.    PAST MEDICAL/SURGICAL HISTORY:  Past Medical History:  Diagnosis Date  . Back pain   . Hypercholesteremia   . Hypertension    Past Surgical History:  Procedure Laterality Date  . BONE MARROW BIOPSY    . LAMINECTOMY N/A 10/06/2017   Procedure: THORACIC NINE AND TEN LAMINECTOMY WITH RESECTION OF TUMOR, THORACIC EIGHT TO THORACIC ELEVEN FUSION WITH PEDICLE SCREW FIXATION;  Surgeon: Earnie Larsson, MD;  Location: [facility];  Service: Neurosurgery;  Laterality: N/A;  . TOTAL KNEE ARTHROPLASTY  2001     SOCIAL HISTORY:  Social History   Socioeconomic History  . Marital status: Widowed    Spouse name: Not on file   . Number of children: 2  . Years of education: Not on file  . Highest education level: Not on file  Occupational History  . Not on file  Tobacco Use  . Smoking status: Former Smoker    Packs/day: 0.50    Years: 30.00    Pack years: 15.00    Types: Cigarettes    Quit date: 05/31/2006    Years since quitting: 12.7  . Smokeless tobacco: Never Used  Substance and Sexual Activity  . Alcohol use: Yes    Comment: wine occassionally  . Drug use: No  . Sexual activity: Not on file  Other Topics Concern  . Not on file  Social History Narrative  . Not on file   Social Determinants of Health   Financial Resource Strain:   . Difficulty of Paying Living Expenses: Not on file  Food Insecurity:   . Worried About Charity fundraiser in the Last Year: Not on file  . Ran Out of Food in the Last Year: Not on file  Transportation Needs:   . Lack of Transportation (Medical): Not on file  . Lack of Transportation (Non-Medical): Not on file  Physical Activity:   . Days of Exercise per Week: Not on file  . Minutes of Exercise per Session: Not on file  Stress:   . Feeling of Stress : Not on file  Social Connections:   . Frequency of Communication with Friends and Family: Not on file  . Frequency  of Social Gatherings with Friends and Family: Not on file  . Attends Religious Services: Not on file  . Active Member of Clubs or Organizations: Not on file  . Attends Archivist Meetings: Not on file  . Marital Status: Not on file  Intimate Partner Violence:   . Fear of Current or Ex-Partner: Not on file  . Emotionally Abused: Not on file  . Physically Abused: Not on file  . Sexually Abused: Not on file    FAMILY HISTORY:  Family History  Problem Relation Age of Onset  . Tuberculosis Mother   . Kidney failure Father   . Hypertension Paternal Aunt   . Diabetes Paternal Aunt   . Hypertension Paternal Uncle   . Diabetes Paternal Uncle   . Hypertension Daughter   . Stroke Daughter      CURRENT MEDICATIONS:  Outpatient Encounter Medications as of 03/04/2019  Medication Sig  . acyclovir (ZOVIRAX) 800 MG tablet Take 1 tablet (800 mg total) by mouth 2 (two) times daily.  [name] amLODipine (NORVASC) 5 MG tablet Take 5 mg by mouth daily.   [name] aspirin 81 MG chewable tablet Chew 81 mg by mouth daily.  . calcium carbonate (CALCIUM 600) 1500 (600 Ca) MG TABS tablet Take by mouth.  . hydrochlorothiazide (HYDRODIURIL) 25 MG tablet Take 25 mg by mouth daily.  [name] KLOR-CON M20 20 MEQ tablet TAKE 1 TABLET BY MOUTH TWICE A DAY  . lenalidomide (REVLIMID) 10 MG capsule Take 1 capsule (10 mg total) by mouth daily.  . polyethylene glycol (MIRALAX / GLYCOLAX) packet Take 17 g by mouth as needed.   . temazepam (RESTORIL) 15 MG capsule TAKE 1 CAPSULE BY MOUTH AT BEDTIME AS NEEDED FOR SLEEP (Patient not taking: Reported on 03/04/2019)  . [DISCONTINUED] REVLIMID 10 MG capsule TAKE 1 CAPSULE (10MG TOTAL) BY MOUTH EVERY DAY   No facility-administered encounter medications on file as of 03/04/2019.    ALLERGIES:  No Known Allergies   PHYSICAL EXAM:  ECOG Performance status: 1  Vitals:   03/04/19 1001  BP: (!) 110/45  Pulse: 88  Resp: 18  Temp: (!) 97.1 F (36.2 C)  SpO2: 99%   Filed Weights   03/04/19 1001  Weight: 169 lb 12.8 oz (77 kg)    Physical Exam Constitutional:      Appearance: Normal appearance. She is normal weight.  Cardiovascular:     Rate and Rhythm: Normal rate and regular rhythm.     Heart sounds: Normal heart sounds.  Pulmonary:     Effort: Pulmonary effort is normal.     Breath sounds: Normal breath sounds.  Abdominal:     General: Bowel sounds are normal.     Palpations: Abdomen is soft.  Musculoskeletal:        General: Normal range of motion.  Skin:    General: Skin is warm and dry.  Neurological:     Mental Status: She is alert and oriented to person, place, and time. Mental status is at baseline.  Psychiatric:        Mood and Affect: Mood normal.         Behavior: Behavior normal.        Thought Content: Thought content normal.        Judgment: Judgment normal.      LABORATORY DATA:  I have reviewed the labs as listed.  CBC    Component Value Date/Time   WBC 3.9 (L) 03/01/2019 0943   RBC 3.93 03/01/2019 0943  HGB 12.6 03/01/2019 0943   HCT 39.0 03/01/2019 0943   PLT 259 03/01/2019 0943   MCV 99.2 03/01/2019 0943   MCH 32.1 03/01/2019 0943   MCHC 32.3 03/01/2019 0943   RDW 15.1 03/01/2019 0943   LYMPHSABS 1.6 03/01/2019 0943   MONOABS 0.7 03/01/2019 0943   EOSABS 0.2 03/01/2019 0943   BASOSABS 0.0 03/01/2019 0943   CMP Latest Ref Rng & Units 03/01/2019 02/04/2019 01/14/2019  Glucose 70 - 99 mg/dL 154(H) 108(H) 125(H)  BUN 8 - 23 mg/dL _0 Creatinine 0.44 - 1.00 mg/dL 0.84 0.80 0.70  Sodium 135 - 145 mmol/L 136 135 135  Potassium 3.5 - 5.1 mmol/L 3.7 4.0 3.8  Chloride 98 - 111 mmol/L 102 101 99  CO2 22 - 32 mmol/L _1 Calcium 8.9 - 10.3 mg/dL 9.1 9.3 9.6  Total Protein 6.5 - 8.1 g/dL 8.2(H) 7.5 7.5  Total Bilirubin 0.3 - 1.2 mg/dL 0.4 0.6 0.4  Alkaline Phos 38 - 126 U/L 70 69 64  AST 15 - 41 U/L _2 ALT 0 - 44 U/L _3 DIAGNOSTIC IMAGING:  I have independently reviewed the scans and discussed with the patient.   ASSESSMENT & PLAN:   Multiple myeloma ([facility]CC) 1.  Stage II IgA kappa multiple myeloma, standard risk: -XRT to the spine, 20 Gray in 10 fractions completed on 11/10/2017 following T9-T10 laminectomy with resection of epidural tumor. -5 cycles of RVD from 11/14/2017 through 02/19/2018. -Auto stem cell transplant on 04/03/2018. - Bone marrow biopsy on 07/11/2018 shows normocellular marrow with less than 1% plasma cells.  MRD was positive in 0.0014% of plasma cells.  Free light chain ratio was normal.  IgA was mildly elevated at 393. -Revlimid maintenance 10 mg 3 weeks on 1 week off started on 08/24/2018. -Myeloma labs from 02/04/2019 shows no M spike.  Immunofixation was  polyclonal.  Kappa light chains were 30.4, lambda light chains 20.5 and ratio of 1.46.  Routine labs were also normal. -She is continuing to tolerate Revlimid without any major problems.  We reviewed labs today which showed white count 3.9.  Calcium was normal.  Creatinine was 0.84. -We have sent myeloma labs which are pending from today.  She has an appointment at [facility] in 4 weeks.  I will see her in 5 weeks for follow-up.  2.  Bone strengthening: - Zometa every 3 months started back on 09/06/2018. -Last Zometa on 12/10/2018.  She will continue calcium and D supplements.     Orders placed this encounter:  No orders of the defined types were placed in this encounter.     Derek Jack, MD [place] [phone]

## 2019-03-04 NOTE — Addendum Note (Signed)
Addended by: Farley Ly on: 03/04/2019 04:51 PM   Modules accepted: Orders

## 2019-03-04 NOTE — Patient Instructions (Addendum)
[place] at [facility] Discharge Instructions  You were seen today by Dr. Delton Coombes. He went over your recent lab results. Continue treatments and Revlimid as directed. He will see you back in 5 weeks for labs and follow up.   Thank you for choosing [place] at [facility] to provide your oncology and hematology care.  To afford each patient quality time with our provider, please arrive at least 15 minutes before your scheduled appointment time.   If you have a lab appointment with the [facility] please come in thru the  Main Entrance and check in at the main information desk  You need to re-schedule your appointment should you arrive 10 or more minutes late.  We strive to give you quality time with our providers, and arriving late affects you and other patients whose appointments are after yours.  Also, if you no show three or more times for appointments you may be dismissed from the clinic at the providers discretion.     Again, thank you for choosing [facility].  Our hope is that these requests will decrease the amount of time that you wait before being seen by our physicians.       _____________________________________________________________  Should you have questions after your visit to [facility], please contact our office at [phone] between the hours of 8:00 a.m. and 4:30 p.m.  Voicemails left after 4:00 p.m. will not be returned until the following business day.  For prescription refill requests, have your pharmacy contact our office and allow 72 hours.    Cancer Center Support Programs:   > Cancer Support Group  2nd Tuesday of the month 1pm-2pm, Journey Room

## 2019-03-04 NOTE — Assessment & Plan Note (Signed)
1.  Stage II IgA kappa multiple myeloma, standard risk: -XRT to the spine, 20 Gray in 10 fractions completed on 11/10/2017 following T9-T10 laminectomy with resection of epidural tumor. -5 cycles of RVD from 11/14/2017 through 02/19/2018. -Auto stem cell transplant on 04/03/2018. - Bone marrow biopsy on 07/11/2018 shows normocellular marrow with less than 1% plasma cells.  MRD was positive in 0.0014% of plasma cells.  Free light chain ratio was normal.  IgA was mildly elevated at 393. -Revlimid maintenance 10 mg 3 weeks on 1 week off started on 08/24/2018. -Myeloma labs from 02/04/2019 shows no M spike.  Immunofixation was polyclonal.  Kappa light chains were 30.4, lambda light chains 20.5 and ratio of 1.46.  Routine labs were also normal. -She is continuing to tolerate Revlimid without any major problems.  We reviewed labs today which showed white count 3.9.  Calcium was normal.  Creatinine was 0.84. -We have sent myeloma labs which are pending from today.  She has an appointment at [facility] in 4 weeks.  I will see her in 5 weeks for follow-up.  2.  Bone strengthening: - Zometa every 3 months started back on 09/06/2018. -Last Zometa on 12/10/2018.  She will continue calcium and D supplements.

## 2019-03-04 NOTE — Progress Notes (Signed)
Patient seen by Dr. Delton Coombes with lab review and ok to treat today verbal order.   Patient tolerated Zometa infusion with no complaints voiced.  Peripheral IV site clean and dry with good blood return noted before and after infusion.  Band aid applied.  VSs with discharge and left ambulatory with no s/s of distress noted.

## 2019-03-05 LAB — IMMUNOFIXATION ELECTROPHORESIS
IgA: 563 mg/dL — ABNORMAL HIGH (ref 64–422)
IgG (Immunoglobin G), Serum: 1318 mg/dL (ref 586–1602)
IgM (Immunoglobulin M), Srm: 25 mg/dL — ABNORMAL LOW (ref 26–217)
Total Protein ELP: 7.4 g/dL (ref 6.0–8.5)

## 2019-03-07 ENCOUNTER — Other Ambulatory Visit (HOSPITAL_COMMUNITY): Payer: Self-pay | Admitting: Hematology

## 2019-03-09 ENCOUNTER — Other Ambulatory Visit (HOSPITAL_COMMUNITY): Payer: Self-pay | Admitting: Hematology

## 2019-03-09 DIAGNOSIS — C9001 Multiple myeloma in remission: Secondary | ICD-10-CM

## 2019-03-11 ENCOUNTER — Other Ambulatory Visit (HOSPITAL_COMMUNITY): Payer: Self-pay | Admitting: *Deleted

## 2019-03-11 DIAGNOSIS — C9001 Multiple myeloma in remission: Secondary | ICD-10-CM

## 2019-03-11 MED ORDER — LENALIDOMIDE 10 MG PO CAPS: 10.0000 mg | ORAL_CAPSULE | Freq: Every day | ORAL | 0 refills | Status: DC

## 2019-03-24 ENCOUNTER — Other Ambulatory Visit (HOSPITAL_COMMUNITY): Payer: Self-pay | Admitting: Nurse Practitioner

## 2019-03-24 DIAGNOSIS — G479 Sleep disorder, unspecified: Secondary | ICD-10-CM

## 2019-03-25 DIAGNOSIS — H52223 Regular astigmatism, bilateral: Secondary | ICD-10-CM | POA: Diagnosis not present

## 2019-03-25 DIAGNOSIS — H524 Presbyopia: Secondary | ICD-10-CM | POA: Diagnosis not present

## 2019-03-25 DIAGNOSIS — H2513 Age-related nuclear cataract, bilateral: Secondary | ICD-10-CM | POA: Diagnosis not present

## 2019-03-25 DIAGNOSIS — H5203 Hypermetropia, bilateral: Secondary | ICD-10-CM | POA: Diagnosis not present

## 2019-03-30 ENCOUNTER — Other Ambulatory Visit (HOSPITAL_COMMUNITY): Payer: Self-pay | Admitting: Hematology

## 2019-03-30 DIAGNOSIS — C9001 Multiple myeloma in remission: Secondary | ICD-10-CM

## 2019-04-01 ENCOUNTER — Other Ambulatory Visit (HOSPITAL_COMMUNITY): Payer: Self-pay | Admitting: *Deleted

## 2019-04-01 DIAGNOSIS — C9001 Multiple myeloma in remission: Secondary | ICD-10-CM

## 2019-04-03 ENCOUNTER — Other Ambulatory Visit (HOSPITAL_COMMUNITY): Payer: Self-pay | Admitting: *Deleted

## 2019-04-03 DIAGNOSIS — Z79899 Other long term (current) drug therapy: Secondary | ICD-10-CM | POA: Diagnosis not present

## 2019-04-03 DIAGNOSIS — Z87891 Personal history of nicotine dependence: Secondary | ICD-10-CM | POA: Diagnosis not present

## 2019-04-03 DIAGNOSIS — C9001 Multiple myeloma in remission: Secondary | ICD-10-CM | POA: Diagnosis not present

## 2019-04-03 DIAGNOSIS — Z9484 Stem cells transplant status: Secondary | ICD-10-CM | POA: Diagnosis not present

## 2019-04-03 DIAGNOSIS — M25519 Pain in unspecified shoulder: Secondary | ICD-10-CM | POA: Diagnosis not present

## 2019-04-03 DIAGNOSIS — Z23 Encounter for immunization: Secondary | ICD-10-CM | POA: Diagnosis not present

## 2019-04-03 DIAGNOSIS — C9002 Multiple myeloma in relapse: Secondary | ICD-10-CM | POA: Diagnosis not present

## 2019-04-03 DIAGNOSIS — F1021 Alcohol dependence, in remission: Secondary | ICD-10-CM | POA: Diagnosis not present

## 2019-04-03 DIAGNOSIS — M8580 Other specified disorders of bone density and structure, unspecified site: Secondary | ICD-10-CM | POA: Diagnosis not present

## 2019-04-03 MED ORDER — LENALIDOMIDE 10 MG PO CAPS: ORAL_CAPSULE | ORAL | 0 refills | Status: DC

## 2019-04-08 ENCOUNTER — Encounter (HOSPITAL_COMMUNITY): Payer: Self-pay | Admitting: Hematology

## 2019-04-08 ENCOUNTER — Inpatient Hospital Stay (HOSPITAL_COMMUNITY): Payer: Medicare HMO | Attending: Hematology | Admitting: Hematology

## 2019-04-08 ENCOUNTER — Other Ambulatory Visit: Payer: Self-pay

## 2019-04-08 ENCOUNTER — Inpatient Hospital Stay (HOSPITAL_COMMUNITY): Payer: Medicare HMO

## 2019-04-08 DIAGNOSIS — M79601 Pain in right arm: Secondary | ICD-10-CM | POA: Insufficient documentation

## 2019-04-08 DIAGNOSIS — C9001 Multiple myeloma in remission: Secondary | ICD-10-CM

## 2019-04-08 DIAGNOSIS — Z87891 Personal history of nicotine dependence: Secondary | ICD-10-CM | POA: Insufficient documentation

## 2019-04-08 DIAGNOSIS — C9000 Multiple myeloma not having achieved remission: Secondary | ICD-10-CM | POA: Diagnosis not present

## 2019-04-08 LAB — CBC WITH DIFFERENTIAL/PLATELET
Abs Immature Granulocytes: 0.01 10*3/uL (ref 0.00–0.07)
Basophils Absolute: 0.1 10*3/uL (ref 0.0–0.1)
Basophils Relative: 2 %
Eosinophils Absolute: 0.4 10*3/uL (ref 0.0–0.5)
Eosinophils Relative: 10 %
HCT: 38.4 % (ref 36.0–46.0)
Hemoglobin: 12.5 g/dL (ref 12.0–15.0)
Immature Granulocytes: 0 %
Lymphocytes Relative: 35 %
Lymphs Abs: 1.5 10*3/uL (ref 0.7–4.0)
MCH: 32.5 pg (ref 26.0–34.0)
MCHC: 32.6 g/dL (ref 30.0–36.0)
MCV: 99.7 fL (ref 80.0–100.0)
Monocytes Absolute: 0.6 10*3/uL (ref 0.1–1.0)
Monocytes Relative: 14 %
Neutro Abs: 1.6 10*3/uL — ABNORMAL LOW (ref 1.7–7.7)
Neutrophils Relative %: 39 %
Platelets: 239 10*3/uL (ref 150–400)
RBC: 3.85 MIL/uL — ABNORMAL LOW (ref 3.87–5.11)
RDW: 14.8 % (ref 11.5–15.5)
WBC: 4.1 10*3/uL (ref 4.0–10.5)
nRBC: 0 % (ref 0.0–0.2)

## 2019-04-08 LAB — COMPREHENSIVE METABOLIC PANEL
ALT: 20 U/L (ref 0–44)
AST: 17 U/L (ref 15–41)
Albumin: 3.7 g/dL (ref 3.5–5.0)
Alkaline Phosphatase: 59 U/L (ref 38–126)
Anion gap: 11 (ref 5–15)
BUN: 19 mg/dL (ref 8–23)
CO2: 22 mmol/L (ref 22–32)
Calcium: 9.4 mg/dL (ref 8.9–10.3)
Chloride: 101 mmol/L (ref 98–111)
Creatinine, Ser: 0.81 mg/dL (ref 0.44–1.00)
GFR calc Af Amer: >60 mL/min (ref >60)
GFR calc non Af Amer: >60 mL/min (ref >60)
Glucose, Bld: 147 mg/dL — ABNORMAL HIGH (ref 70–99)
Potassium: 3.7 mmol/L (ref 3.5–5.1)
Sodium: 134 mmol/L — ABNORMAL LOW (ref 135–145)
Total Bilirubin: 0.5 mg/dL (ref 0.3–1.2)
Total Protein: 7.6 g/dL (ref 6.5–8.1)

## 2019-04-08 LAB — LACTATE DEHYDROGENASE: LDH: 112 U/L (ref 98–192)

## 2019-04-08 NOTE — Assessment & Plan Note (Signed)
1.  Stage II IgA kappa multiple myeloma, standard risk: -XRT to the spine, 20 Gray in 10 fractions completed on 11/10/2017 following T9-T10 laminectomy with resection of epidural tumor. -5 cycles of RVD from 11/14/2017 through 02/19/2018. -Auto stem cell transplant on 04/03/2018. - Bone marrow biopsy on 07/11/2018 shows normocellular marrow with less than 1% plasma cells.  MRD was positive in 0.0014% of plasma cells.  Free light chain ratio was normal.  IgA was mildly elevated at 393. -Revlimid maintenance 10 mg 3 weeks on 1 week off started on 08/24/2018. -We reviewed myeloma labs from 05/01/2018 which showed SPEP is negative.  Immunofixation shows polyclonal gammopathy.  Light chain ratio was normal at 1.45.  Kappa light chains were mildly elevated at 36.3. -She had a bone marrow biopsy on 04/03/2019 at [facility].  I have reviewed the results which showed trilineage hematopoiesis with 45% plasma cells showing kappa predominance by light chain in situ hybridization. -Cytogenetics and FISH panel is pending. -She reports right arm pain on and off for the past 2 months.  She does not have take any pain medicine.  She is also scheduled for PET scan on 04/23/2018. -I have also reviewed labs at [facility] which showed M spike of 0.28 g/dL.  Immunofixation shows IgG kappa. -She started next cycle of Revlimid on 04/04/2018.  She is tolerating it very well. -I will see her back after the PET scan.  2.  Bone strengthening: -She is receiving Zometa every 3 months which was started back on 09/06/2018.  Last dose was on 03/04/2019. -She will receive her next dose in March.  We reviewed her calcium level which is within normal limits.

## 2019-04-08 NOTE — Patient Instructions (Addendum)
[place] at [facility] Discharge Instructions  You were seen today by Dr. Delton Coombes. He went over your recent lab results.Continue injections. He will see you back in 3 weeks for labs and follow up.   Thank you for choosing [place] at [facility] to provide your oncology and hematology care.  To afford each patient quality time with our provider, please arrive at least 15 minutes before your scheduled appointment time.   If you have a lab appointment with the [facility] please come in thru the  Main Entrance and check in at the main information desk  You need to re-schedule your appointment should you arrive 10 or more minutes late.  We strive to give you quality time with our providers, and arriving late affects you and other patients whose appointments are after yours.  Also, if you no show three or more times for appointments you may be dismissed from the clinic at the providers discretion.     Again, thank you for choosing [facility].  Our hope is that these requests will decrease the amount of time that you wait before being seen by our physicians.       _____________________________________________________________  Should you have questions after your visit to [facility], please contact our office at [phone] between the hours of 8:00 a.m. and 4:30 p.m.  Voicemails left after 4:00 p.m. will not be returned until the following business day.  For prescription refill requests, have your pharmacy contact our office and allow 72 hours.    Cancer Center Support Programs:   > Cancer Support Group  2nd Tuesday of the month 1pm-2pm, Journey Room

## 2019-04-08 NOTE — Progress Notes (Signed)
[place], [place] [address]   CLINIC:  Medical Oncology/Hematology  PCP:  Asencion Noble, [place] [address] [phone]   REASON FOR VISIT: Follow-up for multiple myeloma   BRIEF ONCOLOGIC HISTORY:  Oncology History  Multiple myeloma ([place])  10/10/2017 Initial Diagnosis   Multiple myeloma ([place])   11/14/2017 - 02/26/2018 Chemotherapy   The patient had bortezomib SQ (VELCADE) chemo injection 2.5 mg, 1.3 mg/m2 = 2.5 mg, Subcutaneous,  Once, 5 of 6 cycles Administration: 2.5 mg (11/14/2017), 2.5 mg (11/21/2017), 2.5 mg (11/28/2017), 2.5 mg (12/14/2017), 2.5 mg (12/21/2017), 2.5 mg (12/28/2017), 2.5 mg (01/04/2018), 2.5 mg (01/11/2018), 2.5 mg (01/18/2018), 2.5 mg (01/25/2018), 2.5 mg (02/01/2018), 2.5 mg (02/08/2018), 2.5 mg (02/19/2018), 2.5 mg (02/26/2018)  for chemotherapy treatment.       INTERVAL HISTORY:  Jamie Ewing 79 y.o. female seen for follow-up of multiple myeloma.  She is taking Revlimid 10 mg 3 weeks on 1 week off.  She started last cycle on 04/05/2019.  Denies any nausea, vomiting or diarrhea.  On and off right arm pain in the past couple of months.  She does not require any pain medication.  Appetite is 100%.  Energy levels are 75%.     REVIEW OF SYSTEMS:  Review of Systems  All other systems reviewed and are negative.    PAST MEDICAL/SURGICAL HISTORY:  Past Medical History:  Diagnosis Date  . Back pain   . Hypercholesteremia   . Hypertension    Past Surgical History:  Procedure Laterality Date  . BONE MARROW BIOPSY    . LAMINECTOMY N/A 10/06/2017   Procedure: THORACIC NINE AND TEN LAMINECTOMY WITH RESECTION OF TUMOR, THORACIC EIGHT TO THORACIC ELEVEN FUSION WITH PEDICLE SCREW FIXATION;  Surgeon: Earnie Larsson, MD;  Location: [facility];  Service: Neurosurgery;  Laterality: N/A;  . TOTAL KNEE ARTHROPLASTY  2001     SOCIAL HISTORY:  Social History   Socioeconomic History  . Marital status: Widowed   Spouse name: Not on file  . Number of children: 2  . Years of education: Not on file  . Highest education level: Not on file  Occupational History  . Not on file  Tobacco Use  . Smoking status: Former Smoker    Packs/day: 0.50    Years: 30.00    Pack years: 15.00    Types: Cigarettes    Quit date: 05/31/2006    Years since quitting: 12.8  . Smokeless tobacco: Never Used  Substance and Sexual Activity  . Alcohol use: Yes    Comment: wine occassionally  . Drug use: No  . Sexual activity: Not on file  Other Topics Concern  . Not on file  Social History Narrative  . Not on file   Social Determinants of Health   Financial Resource Strain:   . Difficulty of Paying Living Expenses: Not on file  Food Insecurity:   . Worried About Charity fundraiser in the Last Year: Not on file  . Ran Out of Food in the Last Year: Not on file  Transportation Needs:   . Lack of Transportation (Medical): Not on file  . Lack of Transportation (Non-Medical): Not on file  Physical Activity:   . Days of Exercise per Week: Not on file  . Minutes of Exercise per Session: Not on file  Stress:   . Feeling of Stress : Not on file  Social Connections:   . Frequency of Communication with Friends and  Family: Not on file  . Frequency of Social Gatherings with Friends and Family: Not on file  . Attends Religious Services: Not on file  . Active Member of Clubs or Organizations: Not on file  . Attends Archivist Meetings: Not on file  . Marital Status: Not on file  Intimate Partner Violence:   . Fear of Current or Ex-Partner: Not on file  . Emotionally Abused: Not on file  . Physically Abused: Not on file  . Sexually Abused: Not on file    FAMILY HISTORY:  Family History  Problem Relation Age of Onset  . Tuberculosis Mother   . Kidney failure Father   . Hypertension Paternal Aunt   . Diabetes Paternal Aunt   . Hypertension Paternal Uncle   . Diabetes Paternal Uncle   . Hypertension  Daughter   . Stroke Daughter     CURRENT MEDICATIONS:  Outpatient Encounter Medications as of 04/08/2019  Medication Sig  . acyclovir (ZOVIRAX) 800 MG tablet TAKE 1 TABLET TWICE DAILY  . amLODipine (NORVASC) 5 MG tablet Take 5 mg by mouth daily.   [name] aspirin 81 MG chewable tablet Chew 81 mg by mouth daily.  . calcium carbonate (CALCIUM 600) 1500 (600 Ca) MG TABS tablet Take by mouth.  . hydrochlorothiazide (HYDRODIURIL) 25 MG tablet Take 25 mg by mouth daily.  [name] KLOR-CON M20 20 MEQ tablet TAKE 1 TABLET BY MOUTH TWICE A DAY  . lenalidomide (REVLIMID) 10 MG capsule TAKE 1 CAPSULE (10 MG) BY MOUTH EVERY DAY FOR 21 DAYS AND THEN TAKE 7 DAYS OFF  . polyethylene glycol (MIRALAX / GLYCOLAX) packet Take 17 g by mouth as needed.   . temazepam (RESTORIL) 15 MG capsule TAKE 1 CAPSULE BY MOUTH AT BEDTIME AS NEEDED FOR SLEEP (Patient not taking: Reported on 04/08/2019)   No facility-administered encounter medications on file as of 04/08/2019.    ALLERGIES:  No Known Allergies   PHYSICAL EXAM:  ECOG Performance status: 1  Vitals:   04/08/19 1034  BP: 132/69  Pulse: 87  Resp: 18  Temp: 97.9 F (36.6 C)  SpO2: 100%   Filed Weights   04/08/19 1034  Weight: 174 lb 1.6 oz (79 kg)    Physical Exam Constitutional:      Appearance: Normal appearance. She is normal weight.  Cardiovascular:     Rate and Rhythm: Normal rate and regular rhythm.     Heart sounds: Normal heart sounds.  Pulmonary:     Effort: Pulmonary effort is normal.     Breath sounds: Normal breath sounds.  Abdominal:     General: Bowel sounds are normal.     Palpations: Abdomen is soft.  Musculoskeletal:        General: Normal range of motion.  Skin:    General: Skin is warm and dry.  Neurological:     Mental Status: She is alert and oriented to person, place, and time. Mental status is at baseline.  Psychiatric:        Mood and Affect: Mood normal.        Behavior: Behavior normal.        Thought Content: Thought  content normal.        Judgment: Judgment normal.      LABORATORY DATA:  I have reviewed the labs as listed.  CBC    Component Value Date/Time   WBC 4.1 04/08/2019 1004   RBC 3.85 (L) 04/08/2019 1004   HGB 12.5 04/08/2019 1004   HCT 38.4  04/08/2019 1004   PLT 239 04/08/2019 1004   MCV 99.7 04/08/2019 1004   MCH 32.5 04/08/2019 1004   MCHC 32.6 04/08/2019 1004   RDW 14.8 04/08/2019 1004   LYMPHSABS 1.5 04/08/2019 1004   MONOABS 0.6 04/08/2019 1004   EOSABS 0.4 04/08/2019 1004   BASOSABS 0.1 04/08/2019 1004   CMP Latest Ref Rng & Units 04/08/2019 03/01/2019 02/04/2019  Glucose 70 - 99 mg/dL 147(H) 154(H) 108(H)  BUN 8 - 23 mg/dL '19 13 18  '$ Creatinine 0.44 - 1.00 mg/dL 0.81 0.84 0.80  Sodium 135 - 145 mmol/L 134(L) 136 135  Potassium 3.5 - 5.1 mmol/L 3.7 3.7 4.0  Chloride 98 - 111 mmol/L 101 102 101  CO2 22 - 32 mmol/L '22 22 24  '$ Calcium 8.9 - 10.3 mg/dL 9.4 9.1 9.3  Total Protein 6.5 - 8.1 g/dL 7.6 8.2(H) 7.5  Total Bilirubin 0.3 - 1.2 mg/dL 0.5 0.4 0.6  Alkaline Phos 38 - 126 U/L 59 70 69  AST 15 - 41 U/L '17 21 16  '$ ALT 0 - 44 U/L '20 23 19       '$ DIAGNOSTIC IMAGING:  I have independently reviewed the scans and discussed with the patient.   ASSESSMENT & PLAN:   Multiple myeloma (HCC) 1.  Stage II IgA kappa multiple myeloma, standard risk: -XRT to the spine, 20 Gray in 10 fractions completed on 11/10/2017 following T9-T10 laminectomy with resection of epidural tumor. -5 cycles of RVD from 11/14/2017 through 02/19/2018. -Auto stem cell transplant on 04/03/2018. - Bone marrow biopsy on 07/11/2018 shows normocellular marrow with less than 1% plasma cells.  MRD was positive in 0.0014% of plasma cells.  Free light chain ratio was normal.  IgA was mildly elevated at 393. -Revlimid maintenance 10 mg 3 weeks on 1 week off started on 08/24/2018. -We reviewed myeloma labs from 05/01/2018 which showed SPEP is negative.  Immunofixation shows polyclonal gammopathy.  Light chain ratio was  normal at 1.45.  Kappa light chains were mildly elevated at 36.3. -She had a bone marrow biopsy on 04/03/2019 at [facility].  I have reviewed the results which showed trilineage hematopoiesis with 45% plasma cells showing kappa predominance by light chain in situ hybridization. -Cytogenetics and FISH panel is pending. -She reports right arm pain on and off for the past 2 months.  She does not have take any pain medicine.  She is also scheduled for PET scan on 04/23/2018. -I have also reviewed labs at [facility] which showed M spike of 0.28 g/dL.  Immunofixation shows IgG kappa. -She started next cycle of Revlimid on 04/04/2018.  She is tolerating it very well. -I will see her back after the PET scan.  2.  Bone strengthening: -She is receiving Zometa every 3 months which was started back on 09/06/2018.  Last dose was on 03/04/2019. -She will receive her next dose in March.  We reviewed her calcium level which is within normal limits.     Orders placed this encounter:  No orders of the defined types were placed in this encounter.     Derek Jack, MD [place] [phone]

## 2019-04-09 LAB — PROTEIN ELECTROPHORESIS, SERUM
A/G Ratio: 1.1 (ref 0.7–1.7)
Albumin ELP: 3.7 g/dL (ref 2.9–4.4)
Alpha-1-Globulin: 0.2 g/dL (ref 0.0–0.4)
Alpha-2-Globulin: 0.8 g/dL (ref 0.4–1.0)
Beta Globulin: 1.3 g/dL (ref 0.7–1.3)
Gamma Globulin: 1 g/dL (ref 0.4–1.8)
Globulin, Total: 3.4 g/dL (ref 2.2–3.9)
Total Protein ELP: 7.1 g/dL (ref 6.0–8.5)

## 2019-04-09 LAB — KAPPA/LAMBDA LIGHT CHAINS
Kappa free light chain: 33.1 mg/L — ABNORMAL HIGH (ref 3.3–19.4)
Kappa, lambda light chain ratio: 1.53 (ref 0.26–1.65)
Lambda free light chains: 21.7 mg/L (ref 5.7–26.3)

## 2019-04-12 LAB — IMMUNOFIXATION ELECTROPHORESIS
IgA: 570 mg/dL — ABNORMAL HIGH (ref 64–422)
IgG (Immunoglobin G), Serum: 1303 mg/dL (ref 586–1602)
IgM (Immunoglobulin M), Srm: 17 mg/dL — ABNORMAL LOW (ref 26–217)
Total Protein ELP: 7.1 g/dL (ref 6.0–8.5)

## 2019-04-23 DIAGNOSIS — H2513 Age-related nuclear cataract, bilateral: Secondary | ICD-10-CM | POA: Diagnosis not present

## 2019-04-23 DIAGNOSIS — H2512 Age-related nuclear cataract, left eye: Secondary | ICD-10-CM | POA: Diagnosis not present

## 2019-04-23 DIAGNOSIS — H25013 Cortical age-related cataract, bilateral: Secondary | ICD-10-CM | POA: Diagnosis not present

## 2019-04-23 DIAGNOSIS — H18413 Arcus senilis, bilateral: Secondary | ICD-10-CM | POA: Diagnosis not present

## 2019-04-23 DIAGNOSIS — H25043 Posterior subcapsular polar age-related cataract, bilateral: Secondary | ICD-10-CM | POA: Diagnosis not present

## 2019-04-24 DIAGNOSIS — Z9484 Stem cells transplant status: Secondary | ICD-10-CM | POA: Diagnosis not present

## 2019-04-24 DIAGNOSIS — C9001 Multiple myeloma in remission: Secondary | ICD-10-CM | POA: Diagnosis not present

## 2019-04-27 ENCOUNTER — Other Ambulatory Visit (HOSPITAL_COMMUNITY): Payer: Self-pay | Admitting: Hematology

## 2019-04-27 DIAGNOSIS — C9001 Multiple myeloma in remission: Secondary | ICD-10-CM

## 2019-04-29 ENCOUNTER — Encounter (HOSPITAL_COMMUNITY): Payer: Self-pay | Admitting: Hematology

## 2019-04-29 ENCOUNTER — Inpatient Hospital Stay (HOSPITAL_COMMUNITY): Payer: Medicare HMO

## 2019-04-29 ENCOUNTER — Inpatient Hospital Stay (HOSPITAL_COMMUNITY): Payer: Medicare HMO | Attending: Hematology | Admitting: Hematology

## 2019-04-29 ENCOUNTER — Other Ambulatory Visit: Payer: Self-pay

## 2019-04-29 VITALS — BP 115/68 | HR 77 | Temp 97.3°F | Resp 14 | Wt 176.7 lb

## 2019-04-29 DIAGNOSIS — C9000 Multiple myeloma not having achieved remission: Secondary | ICD-10-CM | POA: Insufficient documentation

## 2019-04-29 DIAGNOSIS — M25511 Pain in right shoulder: Secondary | ICD-10-CM | POA: Diagnosis not present

## 2019-04-29 DIAGNOSIS — C9001 Multiple myeloma in remission: Secondary | ICD-10-CM

## 2019-04-29 DIAGNOSIS — Z87891 Personal history of nicotine dependence: Secondary | ICD-10-CM | POA: Diagnosis not present

## 2019-04-29 LAB — COMPREHENSIVE METABOLIC PANEL
ALT: 22 U/L (ref 0–44)
AST: 18 U/L (ref 15–41)
Albumin: 3.6 g/dL (ref 3.5–5.0)
Alkaline Phosphatase: 65 U/L (ref 38–126)
Anion gap: 9 (ref 5–15)
BUN: 16 mg/dL (ref 8–23)
CO2: 25 mmol/L (ref 22–32)
Calcium: 9.2 mg/dL (ref 8.9–10.3)
Chloride: 101 mmol/L (ref 98–111)
Creatinine, Ser: 0.73 mg/dL (ref 0.44–1.00)
GFR calc Af Amer: >60 mL/min (ref >60)
GFR calc non Af Amer: >60 mL/min (ref >60)
Glucose, Bld: 94 mg/dL (ref 70–99)
Potassium: 3.6 mmol/L (ref 3.5–5.1)
Sodium: 135 mmol/L (ref 135–145)
Total Bilirubin: 0.6 mg/dL (ref 0.3–1.2)
Total Protein: 7.7 g/dL (ref 6.5–8.1)

## 2019-04-29 LAB — CBC WITH DIFFERENTIAL/PLATELET
Abs Immature Granulocytes: 0 10*3/uL (ref 0.00–0.07)
Basophils Absolute: 0 10*3/uL (ref 0.0–0.1)
Basophils Relative: 0 %
Eosinophils Absolute: 0.5 10*3/uL (ref 0.0–0.5)
Eosinophils Relative: 9 %
HCT: 38 % (ref 36.0–46.0)
Hemoglobin: 12.3 g/dL (ref 12.0–15.0)
Immature Granulocytes: 0 %
Lymphocytes Relative: 39 %
Lymphs Abs: 1.9 10*3/uL (ref 0.7–4.0)
MCH: 32.9 pg (ref 26.0–34.0)
MCHC: 32.4 g/dL (ref 30.0–36.0)
MCV: 101.6 fL — ABNORMAL HIGH (ref 80.0–100.0)
Monocytes Absolute: 0.8 10*3/uL (ref 0.1–1.0)
Monocytes Relative: 17 %
Neutro Abs: 1.7 10*3/uL (ref 1.7–7.7)
Neutrophils Relative %: 35 %
Platelets: 251 10*3/uL (ref 150–400)
RBC: 3.74 MIL/uL — ABNORMAL LOW (ref 3.87–5.11)
RDW: 14.5 % (ref 11.5–15.5)
WBC: 4.9 10*3/uL (ref 4.0–10.5)
nRBC: 0 % (ref 0.0–0.2)

## 2019-04-29 LAB — LACTATE DEHYDROGENASE: LDH: 113 U/L (ref 98–192)

## 2019-04-29 NOTE — Progress Notes (Signed)
[place], [place] [id number]   CLINIC:  Medical Oncology/Hematology  PCP:  Asencion Noble, [place] [address] [phone]   REASON FOR VISIT: Follow-up for multiple myeloma   BRIEF ONCOLOGIC HISTORY:  Oncology History  Multiple myeloma ([place])  10/10/2017 Initial Diagnosis   Multiple myeloma ([place])   11/14/2017 - 02/26/2018 Chemotherapy   The patient had bortezomib SQ (VELCADE) chemo injection 2.5 mg, 1.3 mg/m2 = 2.5 mg, Subcutaneous,  Once, 5 of 6 cycles Administration: 2.5 mg (11/14/2017), 2.5 mg (11/21/2017), 2.5 mg (11/28/2017), 2.5 mg (12/14/2017), 2.5 mg (12/21/2017), 2.5 mg (12/28/2017), 2.5 mg (01/04/2018), 2.5 mg (01/11/2018), 2.5 mg (01/18/2018), 2.5 mg (01/25/2018), 2.5 mg (02/01/2018), 2.5 mg (02/08/2018), 2.5 mg (02/19/2018), 2.5 mg (02/26/2018)  for chemotherapy treatment.       INTERVAL HISTORY:  Jamie Ewing 79 y.o. female seen for follow-up of multiple myeloma.  She is continuing to tolerate Revlimid 10 mg 3 weeks on 1 week off very well.  Denies any nausea vomiting or diarrhea.  Appetite is 100%.  Energy levels are 75%.  Continues to have mild pain in the right shoulder region.  She had a PET CT scan done recently.  Denies any fevers or infections.     REVIEW OF SYSTEMS:  Review of Systems  All other systems reviewed and are negative.    PAST MEDICAL/SURGICAL HISTORY:  Past Medical History:  Diagnosis Date  . Back pain   . Hypercholesteremia   . Hypertension    Past Surgical History:  Procedure Laterality Date  . BONE MARROW BIOPSY    . LAMINECTOMY N/A 10/06/2017   Procedure: THORACIC NINE AND TEN LAMINECTOMY WITH RESECTION OF TUMOR, THORACIC EIGHT TO THORACIC ELEVEN FUSION WITH PEDICLE SCREW FIXATION;  Surgeon: Earnie Larsson, MD;  Location: [place];  Service: Neurosurgery;  Laterality: N/A;  . TOTAL KNEE ARTHROPLASTY  2001     SOCIAL HISTORY:  Social History   Socioeconomic History  . Marital  status: Widowed    Spouse name: Not on file  . Number of children: 2  . Years of education: Not on file  . Highest education level: Not on file  Occupational History  . Not on file  Tobacco Use  . Smoking status: Former Smoker    Packs/day: 0.50    Years: 30.00    Pack years: 15.00    Types: Cigarettes    Quit date: 05/31/2006    Years since quitting: 12.9  . Smokeless tobacco: Never Used  Substance and Sexual Activity  . Alcohol use: Yes    Comment: wine occassionally  . Drug use: No  . Sexual activity: Not on file  Other Topics Concern  . Not on file  Social History Narrative  . Not on file   Social Determinants of Health   Financial Resource Strain:   . Difficulty of Paying Living Expenses: Not on file  Food Insecurity:   . Worried About Charity fundraiser in the Last Year: Not on file  . Ran Out of Food in the Last Year: Not on file  Transportation Needs:   . Lack of Transportation (Medical): Not on file  . Lack of Transportation (Non-Medical): Not on file  Physical Activity:   . Days of Exercise per Week: Not on file  . Minutes of Exercise per Session: Not on file  Stress:   . Feeling of Stress : Not on file  Social Connections:   . Frequency of  Communication with Friends and Family: Not on file  . Frequency of Social Gatherings with Friends and Family: Not on file  . Attends Religious Services: Not on file  . Active Member of Clubs or Organizations: Not on file  . Attends Archivist Meetings: Not on file  . Marital Status: Not on file  Intimate Partner Violence:   . Fear of Current or Ex-Partner: Not on file  . Emotionally Abused: Not on file  . Physically Abused: Not on file  . Sexually Abused: Not on file    FAMILY HISTORY:  Family History  Problem Relation Age of Onset  . Tuberculosis Mother   . Kidney failure Father   . Hypertension Paternal Aunt   . Diabetes Paternal Aunt   . Hypertension Paternal Uncle   . Diabetes Paternal Uncle     . Hypertension Daughter   . Stroke Daughter     CURRENT MEDICATIONS:  Outpatient Encounter Medications as of 04/29/2019  Medication Sig  . temazepam (RESTORIL) 15 MG capsule TAKE 1 CAPSULE BY MOUTH AT BEDTIME AS NEEDED FOR SLEEP  . acyclovir (ZOVIRAX) 800 MG tablet TAKE 1 TABLET TWICE DAILY  . amLODipine (NORVASC) 5 MG tablet Take 5 mg by mouth daily.   [name] aspirin 81 MG chewable tablet Chew 81 mg by mouth daily.  . calcium carbonate (CALCIUM 600) 1500 (600 Ca) MG TABS tablet Take by mouth.  Derrill Memo ON 05/03/2019] DUREZOL 0.05 % EMUL Place 1 drop into the left eye 3 (three) times daily.  Derrill Memo ON 05/03/2019] gatifloxacin (ZYMAXID) 0.5 % SOLN Place 1 drop into the left eye 4 (four) times daily.  . hydrochlorothiazide (HYDRODIURIL) 25 MG tablet Take 25 mg by mouth daily.  [name] KLOR-CON M20 20 MEQ tablet TAKE 1 TABLET BY MOUTH TWICE A DAY  . lenalidomide (REVLIMID) 10 MG capsule TAKE 1 CAPSULE (10 MG) BY MOUTH EVERY DAY FOR 21 DAYS AND THEN TAKE 7 DAYS OFF  . polyethylene glycol (MIRALAX / GLYCOLAX) packet Take 17 g by mouth as needed.   Derrill Memo ON 05/03/2019] PROLENSA 0.07 % SOLN Place 1 drop into the left eye at bedtime.   No facility-administered encounter medications on file as of 04/29/2019.    ALLERGIES:  No Known Allergies   PHYSICAL EXAM:  ECOG Performance status: 1  Vitals:   04/29/19 1425  BP: 115/68  Pulse: 77  Resp: 14  Temp: (!) 97.3 F (36.3 C)  SpO2: 100%   Filed Weights   04/29/19 1425  Weight: 176 lb 11.2 oz (80.2 kg)    Physical Exam Constitutional:      Appearance: Normal appearance. She is normal weight.  Cardiovascular:     Rate and Rhythm: Normal rate and regular rhythm.     Heart sounds: Normal heart sounds.  Pulmonary:     Effort: Pulmonary effort is normal.     Breath sounds: Normal breath sounds.  Abdominal:     General: Bowel sounds are normal.     Palpations: Abdomen is soft.  Musculoskeletal:        General: Normal range of motion.   Skin:    General: Skin is warm and dry.  Neurological:     Mental Status: She is alert and oriented to person, place, and time. Mental status is at baseline.  Psychiatric:        Mood and Affect: Mood normal.        Behavior: Behavior normal.        Thought Content:  Thought content normal.        Judgment: Judgment normal.      LABORATORY DATA:  I have reviewed the labs as listed.  CBC    Component Value Date/Time   WBC 4.9 04/29/2019 1321   RBC 3.74 (L) 04/29/2019 1321   HGB 12.3 04/29/2019 1321   HCT 38.0 04/29/2019 1321   PLT 251 04/29/2019 1321   MCV 101.6 (H) 04/29/2019 1321   MCH 32.9 04/29/2019 1321   MCHC 32.4 04/29/2019 1321   RDW 14.5 04/29/2019 1321   LYMPHSABS 1.9 04/29/2019 1321   MONOABS 0.8 04/29/2019 1321   EOSABS 0.5 04/29/2019 1321   BASOSABS 0.0 04/29/2019 1321   CMP Latest Ref Rng & Units 04/29/2019 04/08/2019 03/01/2019  Glucose 70 - 99 mg/dL 94 147(H) 154(H)  BUN 8 - 23 mg/dL '16 19 13  '$ Creatinine 0.44 - 1.00 mg/dL 0.73 0.81 0.84  Sodium 135 - 145 mmol/L 135 134(L) 136  Potassium 3.5 - 5.1 mmol/L 3.6 3.7 3.7  Chloride 98 - 111 mmol/L 101 101 102  CO2 22 - 32 mmol/L '25 22 22  '$ Calcium 8.9 - 10.3 mg/dL 9.2 9.4 9.1  Total Protein 6.5 - 8.1 g/dL 7.7 7.6 8.2(H)  Total Bilirubin 0.3 - 1.2 mg/dL 0.6 0.5 0.4  Alkaline Phos 38 - 126 U/L 65 59 70  AST 15 - 41 U/L '18 17 21  '$ ALT 0 - 44 U/L '22 20 23       '$ DIAGNOSTIC IMAGING:  I have independently reviewed the scans and discussed with the patient.   ASSESSMENT & PLAN:   Multiple myeloma (HCC) 1.  Stage II IgA kappa multiple myeloma, standard risk: -XRT to the spine, 20 Gray in 10 fractions completed on 11/10/2017 following T9-T10 laminectomy with resection of epidural tumor. -5 cycles of RVD from 11/14/2017 through 02/19/2018. -Auto stem cell transplant on 04/03/2018. - Bone marrow biopsy on 07/11/2018 shows normocellular marrow with less than 1% plasma cells.  MRD was positive in 0.0014% of plasma cells.   Free light chain ratio was normal.  IgA was mildly elevated at 393. -Revlimid maintenance 10 mg 3 weeks on 1 week off started on 08/24/2018. -She had a bone marrow biopsy on 04/03/2019 at [facility].  I have reviewed the results which showed trilineage hematopoiesis with 4-5% plasma cells showing kappa predominance by light chain in situ hybridization. -FISH panel was negative.  Cytogenetics was normal. -I reviewed PET scan results from 04/24/2019.  Uptake in the right shoulder likely inflammatory.  Diffusely abnormal bones without focal uptake.  She also has increased uptake in the tonsils and lymph nodes in the neck and axilla which is inflammatory.  She will have another PET scan in June for follow-up on these. -We reviewed myeloma panel from 04/08/2019.  SPEP was negative.  Free light chain ratio was 1.53.  Kappa light chains of 33.1.  Immunofixation was polyclonal.  Hemoglobin was 12.3 and creatinine was normal. -We would not change any therapy at this time.  We will continue to follow her in 4 weeks.  2.  Bone strengthening: -She will continue Zometa every 3 months.       Orders placed this encounter:  Orders Placed This Encounter  Procedures  . Protein electrophoresis, serum  . Kappa/lambda light chains  . Lactate dehydrogenase  . CBC with Differential/Platelet  . Comprehensive metabolic panel      Derek Jack, MD [place] [phone]

## 2019-04-29 NOTE — Assessment & Plan Note (Signed)
1.  Stage II IgA kappa multiple myeloma, standard risk: -XRT to the spine, 20 Gray in 10 fractions completed on 11/10/2017 following T9-T10 laminectomy with resection of epidural tumor. -5 cycles of RVD from 11/14/2017 through 02/19/2018. -Auto stem cell transplant on 04/03/2018. - Bone marrow biopsy on 07/11/2018 shows normocellular marrow with less than 1% plasma cells.  MRD was positive in 0.0014% of plasma cells.  Free light chain ratio was normal.  IgA was mildly elevated at 393. -Revlimid maintenance 10 mg 3 weeks on 1 week off started on 08/24/2018. -She had a bone marrow biopsy on 04/03/2019 at [facility].  I have reviewed the results which showed trilineage hematopoiesis with 4-5% plasma cells showing kappa predominance by light chain in situ hybridization. -FISH panel was negative.  Cytogenetics was normal. -I reviewed PET scan results from 04/24/2019.  Uptake in the right shoulder likely inflammatory.  Diffusely abnormal bones without focal uptake.  She also has increased uptake in the tonsils and lymph nodes in the neck and axilla which is inflammatory.  She will have another PET scan in June for follow-up on these. -We reviewed myeloma panel from 04/08/2019.  SPEP was negative.  Free light chain ratio was 1.53.  Kappa light chains of 33.1.  Immunofixation was polyclonal.  Hemoglobin was 12.3 and creatinine was normal. -We would not change any therapy at this time.  We will continue to follow her in 4 weeks.  2.  Bone strengthening: -She will continue Zometa every 3 months.

## 2019-04-29 NOTE — Patient Instructions (Addendum)
[facility] at [facility] Discharge Instructions  You were seen today by Dr. Katragadda. He went over your recent lab results. He will see you back in 6 weeks for labs and follow up.   Thank you for choosing [facility] at [facility] to provide your oncology and hematology care.  To afford each patient quality time with our provider, please arrive at least 15 minutes before your scheduled appointment time.   If you have a lab appointment with the Cancer Center please come in thru the  Main Entrance and check in at the main information desk  You need to re-schedule your appointment should you arrive 10 or more minutes late.  We strive to give you quality time with our providers, and arriving late affects you and other patients whose appointments are after yours.  Also, if you no show three or more times for appointments you may be dismissed from the clinic at the providers discretion.     Again, thank you for choosing [facility].  Our hope is that these requests will decrease the amount of time that you wait before being seen by our physicians.       _____________________________________________________________  Should you have questions after your visit to [facility], please contact our office at [phone] between the hours of 8:00 a.m. and 4:30 p.m.  Voicemails left after 4:00 p.m. will not be returned until the following business day.  For prescription refill requests, have your pharmacy contact our office and allow 72 hours.    Cancer Center Support Programs:   > Cancer Support Group  2nd Tuesday of the month 1pm-2pm, Journey Room    

## 2019-04-30 LAB — PROTEIN ELECTROPHORESIS, SERUM
A/G Ratio: 0.9 (ref 0.7–1.7)
Albumin ELP: 3.6 g/dL (ref 2.9–4.4)
Alpha-1-Globulin: 0.2 g/dL (ref 0.0–0.4)
Alpha-2-Globulin: 1 g/dL (ref 0.4–1.0)
Beta Globulin: 1.3 g/dL (ref 0.7–1.3)
Gamma Globulin: 1.5 g/dL (ref 0.4–1.8)
Globulin, Total: 4 g/dL — ABNORMAL HIGH (ref 2.2–3.9)
Total Protein ELP: 7.6 g/dL (ref 6.0–8.5)

## 2019-05-01 ENCOUNTER — Other Ambulatory Visit (HOSPITAL_COMMUNITY): Payer: Self-pay | Admitting: *Deleted

## 2019-05-01 LAB — KAPPA/LAMBDA LIGHT CHAINS
Kappa free light chain: 31.3 mg/L — ABNORMAL HIGH (ref 3.3–19.4)
Kappa, lambda light chain ratio: 1.39 (ref 0.26–1.65)
Lambda free light chains: 22.5 mg/L (ref 5.7–26.3)

## 2019-05-02 LAB — IMMUNOFIXATION ELECTROPHORESIS
IgA: 579 mg/dL — ABNORMAL HIGH (ref 64–422)
IgG (Immunoglobin G), Serum: 1373 mg/dL (ref 586–1602)
IgM (Immunoglobulin M), Srm: 23 mg/dL — ABNORMAL LOW (ref 26–217)
Total Protein ELP: 7.5 g/dL (ref 6.0–8.5)

## 2019-05-06 DIAGNOSIS — H2512 Age-related nuclear cataract, left eye: Secondary | ICD-10-CM | POA: Diagnosis not present

## 2019-05-07 ENCOUNTER — Other Ambulatory Visit (HOSPITAL_COMMUNITY): Payer: Self-pay | Admitting: Nurse Practitioner

## 2019-05-07 DIAGNOSIS — H2511 Age-related nuclear cataract, right eye: Secondary | ICD-10-CM | POA: Diagnosis not present

## 2019-05-07 DIAGNOSIS — G479 Sleep disorder, unspecified: Secondary | ICD-10-CM

## 2019-05-23 ENCOUNTER — Inpatient Hospital Stay (HOSPITAL_COMMUNITY): Payer: Medicare HMO | Attending: Hematology

## 2019-05-23 ENCOUNTER — Other Ambulatory Visit: Payer: Self-pay

## 2019-05-23 DIAGNOSIS — R200 Anesthesia of skin: Secondary | ICD-10-CM | POA: Insufficient documentation

## 2019-05-23 DIAGNOSIS — C9000 Multiple myeloma not having achieved remission: Secondary | ICD-10-CM | POA: Insufficient documentation

## 2019-05-23 DIAGNOSIS — Z87891 Personal history of nicotine dependence: Secondary | ICD-10-CM | POA: Insufficient documentation

## 2019-05-23 DIAGNOSIS — C9001 Multiple myeloma in remission: Secondary | ICD-10-CM

## 2019-05-23 LAB — CBC WITH DIFFERENTIAL/PLATELET
Abs Immature Granulocytes: 0.01 10*3/uL (ref 0.00–0.07)
Basophils Absolute: 0 10*3/uL (ref 0.0–0.1)
Basophils Relative: 1 %
Eosinophils Absolute: 0.3 10*3/uL (ref 0.0–0.5)
Eosinophils Relative: 6 %
HCT: 36.9 % (ref 36.0–46.0)
Hemoglobin: 12 g/dL (ref 12.0–15.0)
Immature Granulocytes: 0 %
Lymphocytes Relative: 35 %
Lymphs Abs: 1.9 10*3/uL (ref 0.7–4.0)
MCH: 32.9 pg (ref 26.0–34.0)
MCHC: 32.5 g/dL (ref 30.0–36.0)
MCV: 101.1 fL — ABNORMAL HIGH (ref 80.0–100.0)
Monocytes Absolute: 1 10*3/uL (ref 0.1–1.0)
Monocytes Relative: 18 %
Neutro Abs: 2.1 10*3/uL (ref 1.7–7.7)
Neutrophils Relative %: 40 %
Platelets: 204 10*3/uL (ref 150–400)
RBC: 3.65 MIL/uL — ABNORMAL LOW (ref 3.87–5.11)
RDW: 14.6 % (ref 11.5–15.5)
WBC: 5.3 10*3/uL (ref 4.0–10.5)
nRBC: 0 % (ref 0.0–0.2)

## 2019-05-23 LAB — COMPREHENSIVE METABOLIC PANEL
ALT: 25 U/L (ref 0–44)
AST: 24 U/L (ref 15–41)
Albumin: 3.7 g/dL (ref 3.5–5.0)
Alkaline Phosphatase: 64 U/L (ref 38–126)
Anion gap: 9 (ref 5–15)
BUN: 14 mg/dL (ref 8–23)
CO2: 24 mmol/L (ref 22–32)
Calcium: 9.4 mg/dL (ref 8.9–10.3)
Chloride: 99 mmol/L (ref 98–111)
Creatinine, Ser: 0.78 mg/dL (ref 0.44–1.00)
GFR calc Af Amer: >60 mL/min (ref >60)
GFR calc non Af Amer: >60 mL/min (ref >60)
Glucose, Bld: 104 mg/dL — ABNORMAL HIGH (ref 70–99)
Potassium: 4 mmol/L (ref 3.5–5.1)
Sodium: 132 mmol/L — ABNORMAL LOW (ref 135–145)
Total Bilirubin: 0.6 mg/dL (ref 0.3–1.2)
Total Protein: 7.4 g/dL (ref 6.5–8.1)

## 2019-05-23 LAB — LACTATE DEHYDROGENASE: LDH: 130 U/L (ref 98–192)

## 2019-05-24 ENCOUNTER — Other Ambulatory Visit (HOSPITAL_COMMUNITY): Payer: Self-pay | Admitting: Hematology

## 2019-05-24 DIAGNOSIS — C9001 Multiple myeloma in remission: Secondary | ICD-10-CM

## 2019-05-24 LAB — PROTEIN ELECTROPHORESIS, SERUM
A/G Ratio: 1 (ref 0.7–1.7)
Albumin ELP: 3.8 g/dL (ref 2.9–4.4)
Alpha-1-Globulin: 0.3 g/dL (ref 0.0–0.4)
Alpha-2-Globulin: 0.9 g/dL (ref 0.4–1.0)
Beta Globulin: 1.2 g/dL (ref 0.7–1.3)
Gamma Globulin: 1.3 g/dL (ref 0.4–1.8)
Globulin, Total: 3.7 g/dL (ref 2.2–3.9)
Total Protein ELP: 7.5 g/dL (ref 6.0–8.5)

## 2019-05-24 LAB — KAPPA/LAMBDA LIGHT CHAINS
Kappa free light chain: 39.3 mg/L — ABNORMAL HIGH (ref 3.3–19.4)
Kappa, lambda light chain ratio: 1.34 (ref 0.26–1.65)
Lambda free light chains: 29.3 mg/L — ABNORMAL HIGH (ref 5.7–26.3)

## 2019-05-27 ENCOUNTER — Ambulatory Visit (HOSPITAL_COMMUNITY): Payer: Medicare HMO | Admitting: Hematology

## 2019-05-27 ENCOUNTER — Ambulatory Visit (HOSPITAL_COMMUNITY): Payer: Medicare HMO

## 2019-05-27 DIAGNOSIS — H2511 Age-related nuclear cataract, right eye: Secondary | ICD-10-CM | POA: Diagnosis not present

## 2019-05-28 LAB — IMMUNOFIXATION ELECTROPHORESIS
IgA: 550 mg/dL — ABNORMAL HIGH (ref 64–422)
IgG (Immunoglobin G), Serum: 1265 mg/dL (ref 586–1602)
IgM (Immunoglobulin M), Srm: 20 mg/dL — ABNORMAL LOW (ref 26–217)
Total Protein ELP: 7.4 g/dL (ref 6.0–8.5)

## 2019-05-30 ENCOUNTER — Inpatient Hospital Stay (HOSPITAL_BASED_OUTPATIENT_CLINIC_OR_DEPARTMENT_OTHER): Payer: Medicare HMO | Admitting: Hematology

## 2019-05-30 ENCOUNTER — Encounter (HOSPITAL_COMMUNITY): Payer: Self-pay | Admitting: Hematology

## 2019-05-30 ENCOUNTER — Inpatient Hospital Stay (HOSPITAL_COMMUNITY): Payer: Medicare HMO

## 2019-05-30 ENCOUNTER — Other Ambulatory Visit: Payer: Self-pay

## 2019-05-30 VITALS — BP 110/58 | HR 80 | Temp 97.7°F | Resp 16

## 2019-05-30 DIAGNOSIS — R200 Anesthesia of skin: Secondary | ICD-10-CM | POA: Diagnosis not present

## 2019-05-30 DIAGNOSIS — C9001 Multiple myeloma in remission: Secondary | ICD-10-CM | POA: Diagnosis not present

## 2019-05-30 DIAGNOSIS — C9000 Multiple myeloma not having achieved remission: Secondary | ICD-10-CM

## 2019-05-30 DIAGNOSIS — Z87891 Personal history of nicotine dependence: Secondary | ICD-10-CM | POA: Diagnosis not present

## 2019-05-30 MED ORDER — SODIUM CHLORIDE 0.9 % IV SOLN: INTRAVENOUS | Status: DC

## 2019-05-30 MED ORDER — ZOLEDRONIC ACID 4 MG/5ML IV CONC: 4.0000 mg | Freq: Once | INTRAVENOUS | Status: DC

## 2019-05-30 MED ORDER — ZOLEDRONIC ACID 4 MG/100ML IV SOLN
4.0000 mg | Freq: Once | INTRAVENOUS | Status: AC
  Administered 2019-05-30: 4 mg via INTRAVENOUS
  Filled 2019-05-30: qty 100

## 2019-05-30 NOTE — Patient Instructions (Signed)
[facility] at [facility] Discharge Instructions  You were seen today by Dr. Delton Coombes. He went over your recent lab results. Continue Zometa every 3 months. He will see you back in 2 months for labs and follow up.   Thank you for choosing [place] at [facility] to provide your oncology and hematology care.  To afford each patient quality time with our provider, please arrive at least 15 minutes before your scheduled appointment time.   If you have a lab appointment with the [place] please come in thru the  Main Entrance and check in at the main information desk  You need to re-schedule your appointment should you arrive 10 or more minutes late.  We strive to give you quality time with our providers, and arriving late affects you and other patients whose appointments are after yours.  Also, if you no show three or more times for appointments you may be dismissed from the clinic at the providers discretion.     Again, thank you for choosing [facility].  Our hope is that these requests will decrease the amount of time that you wait before being seen by our physicians.       _____________________________________________________________  Should you have questions after your visit to [facility], please contact our office at [phone] between the hours of 8:00 a.m. and 4:30 p.m.  Voicemails left after 4:00 p.m. will not be returned until the following business day.  For prescription refill requests, have your pharmacy contact our office and allow 72 hours.    Cancer Center Support Programs:   > Cancer Support Group  2nd Tuesday of the month 1pm-2pm, Journey Room

## 2019-05-30 NOTE — Progress Notes (Signed)
Patient has been assessed, vital signs and labs have been reviewed by Dr. Katragadda. ANC, Creatinine, LFTs, and Platelets are within treatment parameters per Dr. Katragadda. The patient is good to proceed with treatment at this time.  

## 2019-05-30 NOTE — Progress Notes (Signed)
Patient tolerated Zometa with no complaints voice.  Peripheral IV site clean and dry with good blood return noted before and after infusion.  No bruising or swelling noted at site.  Band aid applied.  VSs with discharge and left ambulatory with no s/s of distress noted.

## 2019-05-30 NOTE — Progress Notes (Signed)
Ok to give zometa today per MD

## 2019-05-30 NOTE — Progress Notes (Signed)
[address] [place], [place] [address]   CLINIC:  Medical Oncology/Hematology  PCP:  Asencion Noble, [place]  [id number] [phone]   REASON FOR VISIT: Follow-up for multiple myeloma   BRIEF ONCOLOGIC HISTORY:  Oncology History  Multiple myeloma ([facility])  10/10/2017 Initial Diagnosis   Multiple myeloma ([place])   11/14/2017 - 02/26/2018 Chemotherapy   The patient had bortezomib SQ (VELCADE) chemo injection 2.5 mg, 1.3 mg/m2 = 2.5 mg, Subcutaneous,  Once, 5 of 6 cycles Administration: 2.5 mg (11/14/2017), 2.5 mg (11/21/2017), 2.5 mg (11/28/2017), 2.5 mg (12/14/2017), 2.5 mg (12/21/2017), 2.5 mg (12/28/2017), 2.5 mg (01/04/2018), 2.5 mg (01/11/2018), 2.5 mg (01/18/2018), 2.5 mg (01/25/2018), 2.5 mg (02/01/2018), 2.5 mg (02/08/2018), 2.5 mg (02/19/2018), 2.5 mg (02/26/2018)  for chemotherapy treatment.       INTERVAL HISTORY:  [name] 79 y.o. female seen for follow-up of multiple myeloma.  She is taking Revlimid 10 mg 3 weeks on 1 week off.  Denies any nausea vomiting diarrhea or constipation.  Appetite and energy levels are 100%.  No pains reported.  No fevers or infections.  Denies any recent ER visits or hospitalizations.  Numbness in the feet from back problem is stable.    REVIEW OF SYSTEMS:  Review of Systems  Neurological: Positive for numbness.  All other systems reviewed and are negative.    PAST MEDICAL/SURGICAL HISTORY:  Past Medical History:  Diagnosis Date  . Back pain   . Hypercholesteremia   . Hypertension    Past Surgical History:  Procedure Laterality Date  . BONE MARROW BIOPSY    . LAMINECTOMY N/A 10/06/2017   Procedure: THORACIC NINE AND TEN LAMINECTOMY WITH RESECTION OF TUMOR, THORACIC EIGHT TO THORACIC ELEVEN FUSION WITH PEDICLE SCREW FIXATION;  Surgeon: Earnie Larsson, MD;  Location: [facility];  Service: Neurosurgery;  Laterality: N/A;  . TOTAL KNEE ARTHROPLASTY  2001     SOCIAL HISTORY:  Social History    Socioeconomic History  . Marital status: Widowed    Spouse name: Not on file  . Number of children: 2  . Years of education: Not on file  . Highest education level: Not on file  Occupational History  . Not on file  Tobacco Use  . Smoking status: Former Smoker    Packs/day: 0.50    Years: 30.00    Pack years: 15.00    Types: Cigarettes    Quit date: 05/31/2006    Years since quitting: 13.0  . Smokeless tobacco: Never Used  Substance and Sexual Activity  . Alcohol use: Yes    Comment: wine occassionally  . Drug use: No  . Sexual activity: Not on file  Other Topics Concern  . Not on file  Social History Narrative  . Not on file   Social Determinants of Health   Financial Resource Strain:   . Difficulty of Paying Living Expenses:   Food Insecurity:   . Worried About Charity fundraiser in the Last Year:   . Arboriculturist in the Last Year:   Transportation Needs:   . Film/video editor (Medical):   [name] Lack of Transportation (Non-Medical):   Physical Activity:   . Days of Exercise per Week:   . Minutes of Exercise per Session:   Stress:   . Feeling of Stress :   Social Connections:   . Frequency of Communication with Friends and Family:   . Frequency of Social Gatherings with Friends and Family:   .  Attends Religious Services:   . Active Member of Clubs or Organizations:   . Attends Archivist Meetings:   [name] Marital Status:   Intimate Partner Violence:   . Fear of Current or Ex-Partner:   . Emotionally Abused:   [name] Physically Abused:   . Sexually Abused:     FAMILY HISTORY:  Family History  Problem Relation Age of [place]  . Tuberculosis Mother   . Kidney failure Father   . Hypertension Paternal Aunt   . Diabetes Paternal Aunt   . Hypertension Paternal Uncle   . Diabetes Paternal Uncle   . Hypertension Daughter   . Stroke Daughter     CURRENT MEDICATIONS:  Outpatient Encounter Medications as of 05/30/2019  Medication Sig  . acyclovir  (ZOVIRAX) 800 MG tablet TAKE 1 TABLET TWICE DAILY  . amLODipine (NORVASC) 5 MG tablet Take 5 mg by mouth daily.   [name] aspirin 81 MG chewable tablet Chew 81 mg by mouth daily.  . calcium carbonate (CALCIUM 600) 1500 (600 Ca) MG TABS tablet Take by mouth.  . DUREZOL 0.05 % EMUL Place 1 drop into the left eye 3 (three) times daily.  [name] gatifloxacin (ZYMAXID) 0.5 % SOLN Place 1 drop into the left eye 4 (four) times daily.  . hydrochlorothiazide (HYDRODIURIL) 25 MG tablet Take 25 mg by mouth daily.  [name] KLOR-CON M20 20 MEQ tablet TAKE 1 TABLET BY MOUTH TWICE A DAY  . lenalidomide (REVLIMID) 10 MG capsule TAKE 1 CAPSULE (10MG) BY MOUTH EVERY DAY FOR 21 DAYS AND THEN TAKE 7 DAYS OFF  . polyethylene glycol (MIRALAX / GLYCOLAX) packet Take 17 g by mouth as needed.   [name] PROLENSA 0.07 % SOLN Place 1 drop into the left eye at bedtime.  . temazepam (RESTORIL) 15 MG capsule TAKE 1 CAPSULE BY MOUTH AT BEDTIME AS NEEDED FOR SLEEP   No facility-administered encounter medications on file as of 05/30/2019.    ALLERGIES:  No Known Allergies   PHYSICAL EXAM:  ECOG Performance status: 1  Vitals:   05/30/19 1000  BP: (!) 142/64  Pulse: 85  Resp: 16  Temp: (!) 96.9 F (36.1 C)  SpO2: 100%   Filed Weights   05/30/19 1000  Weight: 174 lb 1 oz (79 kg)    Physical Exam Constitutional:      Appearance: Normal appearance. She is normal weight.  Cardiovascular:     Rate and Rhythm: Normal rate and regular rhythm.     Heart sounds: Normal heart sounds.  Pulmonary:     Effort: Pulmonary effort is normal.     Breath sounds: Normal breath sounds.  Abdominal:     General: Bowel sounds are normal.     Palpations: Abdomen is soft.  Musculoskeletal:        General: Normal range of motion.  Skin:    General: Skin is warm and dry.  Neurological:     Mental Status: She is alert and oriented to person, place, and time. Mental status is at baseline.  Psychiatric:        Mood and Affect: Mood normal.         Behavior: Behavior normal.        Thought Content: Thought content normal.        Judgment: Judgment normal.      LABORATORY DATA:  I have reviewed the labs as listed.  CBC    Component Value Date/Time   WBC 5.3 05/23/2019 1149   RBC 3.65 (L) 05/23/2019 1149  HGB 12.0 05/23/2019 1149   HCT 36.9 05/23/2019 1149   PLT 204 05/23/2019 1149   MCV 101.1 (H) 05/23/2019 1149   MCH 32.9 05/23/2019 1149   MCHC 32.5 05/23/2019 1149   RDW 14.6 05/23/2019 1149   LYMPHSABS 1.9 05/23/2019 1149   MONOABS 1.0 05/23/2019 1149   EOSABS 0.3 05/23/2019 1149   BASOSABS 0.0 05/23/2019 1149   CMP Latest Ref Rng & Units 05/23/2019 04/29/2019 04/08/2019  Glucose 70 - 99 mg/dL 104(H) 94 147(H)  BUN 8 - 23 mg/dL _0 Creatinine 0.44 - 1.00 mg/dL 0.78 0.73 0.81  Sodium 135 - 145 mmol/L 132(L) 135 134(L)  Potassium 3.5 - 5.1 mmol/L 4.0 3.6 3.7  Chloride 98 - 111 mmol/L 99 101 101  CO2 22 - 32 mmol/L _1 Calcium 8.9 - 10.3 mg/dL 9.4 9.2 9.4  Total Protein 6.5 - 8.1 g/dL 7.4 7.7 7.6  Total Bilirubin 0.3 - 1.2 mg/dL 0.6 0.6 0.5  Alkaline Phos 38 - 126 U/L 64 65 59  AST 15 - 41 U/L _2 ALT 0 - 44 U/L _3 DIAGNOSTIC IMAGING:  I have independently reviewed the scans.   ASSESSMENT & PLAN:   Multiple myeloma ([facility]) 1.  Stage II IgA kappa multiple myeloma, standard risk: -XRT to spine, 20 Gray in 10 fractions completed on 11/10/2017 following T9-T10 laminectomy with resection of epidural tumor. -5 cycles of RVD from 11/14/2017 through 02/19/2018 followed by stem cell transplant on 04/03/2018. -Revlimid maintenance 10 mg 3 weeks on/1 week off started on 08/24/2018. -BM BX on 04/03/2019-trilineage hematopoiesis with 4-5% plasma cells showing kappa predominance by light chain in situ hybridization.  FISH panel was negative.  Cytogenetics was normal. -PET scan from [facility] on 04/24/2019 shows uptake in the right shoulder likely inflammatory.  Diffusely abnormal bones  without focal uptake.  She also has increased uptake in the tonsils and lymph nodes in the neck and axilla which is inflammatory. -He has follow-up PET scan scheduled in June of this year. -We reviewed myeloma panel from 05/23/2019.  M spike and immunofixation are normal.  Free light chain ratio is also 1.34. -CBC was also normal.  She will continue Revlimid maintenance at this time. -I will see her back in 8 weeks for follow-up.  2.  Myeloma bone disease: -We have changed her Zometa to every 3 months.  She will receive Zometa today.  Calcium is normal.  Orders placed this encounter:  No orders of the defined types were placed in this encounter.     Derek Jack, MD [place] [phone]

## 2019-05-30 NOTE — Assessment & Plan Note (Addendum)
1.  Stage II IgA kappa multiple myeloma, standard risk: -XRT to spine, 20 Gray in 10 fractions completed on 11/10/2017 following T9-T10 laminectomy with resection of epidural tumor. -5 cycles of RVD from 11/14/2017 through 02/19/2018 followed by stem cell transplant on 04/03/2018. -Revlimid maintenance 10 mg 3 weeks on/1 week off started on 08/24/2018. -BM BX on 04/03/2019-trilineage hematopoiesis with 4-5% plasma cells showing kappa predominance by light chain in situ hybridization.  FISH panel was negative.  Cytogenetics was normal. -PET scan from [facility] on 04/24/2019 shows uptake in the right shoulder likely inflammatory.  Diffusely abnormal bones without focal uptake.  She also has increased uptake in the tonsils and lymph nodes in the neck and axilla which is inflammatory. -He has follow-up PET scan scheduled in June of this year. -We reviewed myeloma panel from 05/23/2019.  M spike and immunofixation are normal.  Free light chain ratio is also 1.34. -CBC was also normal.  She will continue Revlimid maintenance at this time. -I will see her back in 8 weeks for follow-up.  2.  Myeloma bone disease: -We have changed her Zometa to every 3 months.  She will receive Zometa today.  Calcium is normal.

## 2019-06-14 ENCOUNTER — Other Ambulatory Visit (HOSPITAL_COMMUNITY): Payer: Self-pay | Admitting: Nurse Practitioner

## 2019-06-14 DIAGNOSIS — E876 Hypokalemia: Secondary | ICD-10-CM

## 2019-06-16 ENCOUNTER — Other Ambulatory Visit (HOSPITAL_COMMUNITY): Payer: Self-pay | Admitting: Hematology

## 2019-06-16 DIAGNOSIS — C9001 Multiple myeloma in remission: Secondary | ICD-10-CM

## 2019-06-18 ENCOUNTER — Other Ambulatory Visit (HOSPITAL_COMMUNITY): Payer: Self-pay | Admitting: Nurse Practitioner

## 2019-06-18 DIAGNOSIS — G479 Sleep disorder, unspecified: Secondary | ICD-10-CM

## 2019-07-11 ENCOUNTER — Other Ambulatory Visit (HOSPITAL_COMMUNITY): Payer: Self-pay | Admitting: Hematology

## 2019-07-11 DIAGNOSIS — C9001 Multiple myeloma in remission: Secondary | ICD-10-CM

## 2019-07-17 ENCOUNTER — Other Ambulatory Visit (HOSPITAL_COMMUNITY): Payer: Self-pay | Admitting: Surgery

## 2019-07-17 DIAGNOSIS — E876 Hypokalemia: Secondary | ICD-10-CM

## 2019-07-17 MED ORDER — POTASSIUM CHLORIDE CRYS ER 20 MEQ PO TBCR: 20.0000 meq | EXTENDED_RELEASE_TABLET | Freq: Two times a day (BID) | ORAL | 0 refills | Status: DC

## 2019-07-25 ENCOUNTER — Other Ambulatory Visit: Payer: Self-pay

## 2019-07-25 ENCOUNTER — Other Ambulatory Visit (HOSPITAL_COMMUNITY): Payer: Self-pay | Admitting: Hematology

## 2019-07-25 ENCOUNTER — Inpatient Hospital Stay (HOSPITAL_COMMUNITY): Payer: Medicare HMO

## 2019-07-25 ENCOUNTER — Inpatient Hospital Stay (HOSPITAL_COMMUNITY): Payer: Medicare HMO | Attending: Hematology | Admitting: Hematology

## 2019-07-25 DIAGNOSIS — R5383 Other fatigue: Secondary | ICD-10-CM | POA: Insufficient documentation

## 2019-07-25 DIAGNOSIS — Z87891 Personal history of nicotine dependence: Secondary | ICD-10-CM | POA: Insufficient documentation

## 2019-07-25 DIAGNOSIS — R200 Anesthesia of skin: Secondary | ICD-10-CM | POA: Insufficient documentation

## 2019-07-25 DIAGNOSIS — C9000 Multiple myeloma not having achieved remission: Secondary | ICD-10-CM | POA: Insufficient documentation

## 2019-07-25 DIAGNOSIS — C9001 Multiple myeloma in remission: Secondary | ICD-10-CM

## 2019-07-25 LAB — COMPREHENSIVE METABOLIC PANEL
ALT: 20 U/L (ref 0–44)
AST: 17 U/L (ref 15–41)
Albumin: 3.9 g/dL (ref 3.5–5.0)
Alkaline Phosphatase: 57 U/L (ref 38–126)
Anion gap: 10 (ref 5–15)
BUN: 18 mg/dL (ref 8–23)
CO2: 26 mmol/L (ref 22–32)
Calcium: 9.8 mg/dL (ref 8.9–10.3)
Chloride: 100 mmol/L (ref 98–111)
Creatinine, Ser: 0.81 mg/dL (ref 0.44–1.00)
GFR calc Af Amer: >60 mL/min (ref >60)
GFR calc non Af Amer: >60 mL/min (ref >60)
Glucose, Bld: 131 mg/dL — ABNORMAL HIGH (ref 70–99)
Potassium: 4.1 mmol/L (ref 3.5–5.1)
Sodium: 136 mmol/L (ref 135–145)
Total Bilirubin: 0.5 mg/dL (ref 0.3–1.2)
Total Protein: 8 g/dL (ref 6.5–8.1)

## 2019-07-25 LAB — CBC WITH DIFFERENTIAL/PLATELET
Abs Immature Granulocytes: 0 K/uL (ref 0.00–0.07)
Basophils Absolute: 0 K/uL (ref 0.0–0.1)
Basophils Relative: 1 %
Eosinophils Absolute: 0.3 K/uL (ref 0.0–0.5)
Eosinophils Relative: 6 %
HCT: 37.7 % (ref 36.0–46.0)
Hemoglobin: 12.2 g/dL (ref 12.0–15.0)
Immature Granulocytes: 0 %
Lymphocytes Relative: 38 %
Lymphs Abs: 2.1 K/uL (ref 0.7–4.0)
MCH: 33.3 pg (ref 26.0–34.0)
MCHC: 32.4 g/dL (ref 30.0–36.0)
MCV: 103 fL — ABNORMAL HIGH (ref 80.0–100.0)
Monocytes Absolute: 0.8 K/uL (ref 0.1–1.0)
Monocytes Relative: 15 %
Neutro Abs: 2.3 K/uL (ref 1.7–7.7)
Neutrophils Relative %: 40 %
Platelets: 232 K/uL (ref 150–400)
RBC: 3.66 MIL/uL — ABNORMAL LOW (ref 3.87–5.11)
RDW: 14.5 % (ref 11.5–15.5)
WBC: 5.6 K/uL (ref 4.0–10.5)
nRBC: 0 % (ref 0.0–0.2)

## 2019-07-25 LAB — LACTATE DEHYDROGENASE: LDH: 120 U/L (ref 98–192)

## 2019-07-25 NOTE — Patient Instructions (Signed)
[facility] at [facility] Discharge Instructions  You were seen today by Dr. Katragadda. He went over your recent lab and scan results. He will see you back in 2 months for labs and follow up.   Thank you for choosing [facility] at [facility] to provide your oncology and hematology care.  To afford each patient quality time with our provider, please arrive at least 15 minutes before your scheduled appointment time.   If you have a lab appointment with the Cancer Center please come in thru the  Main Entrance and check in at the main information desk  You need to re-schedule your appointment should you arrive 10 or more minutes late.  We strive to give you quality time with our providers, and arriving late affects you and other patients whose appointments are after yours.  Also, if you no show three or more times for appointments you may be dismissed from the clinic at the providers discretion.     Again, thank you for choosing [facility].  Our hope is that these requests will decrease the amount of time that you wait before being seen by our physicians.       _____________________________________________________________  Should you have questions after your visit to [facility], please contact our office at [phone] between the hours of 8:00 a.m. and 4:30 p.m.  Voicemails left after 4:00 p.m. will not be returned until the following business day.  For prescription refill requests, have your pharmacy contact our office and allow 72 hours.    Cancer Center Support Programs:   > Cancer Support Group  2nd Tuesday of the month 1pm-2pm, Journey Room    

## 2019-07-26 LAB — KAPPA/LAMBDA LIGHT CHAINS
Kappa free light chain: 29 mg/L — ABNORMAL HIGH (ref 3.3–19.4)
Kappa, lambda light chain ratio: 1.53 (ref 0.26–1.65)
Lambda free light chains: 18.9 mg/L (ref 5.7–26.3)

## 2019-07-26 NOTE — Assessment & Plan Note (Signed)
1.  Stage II IgA kappa multiple myeloma, standard risk: -XRT to the spine, completed on November 10, 2017 following T9-T10 laminectomy with resection of epidural tumor. -5 cycles of RVD from 11/14/2017 through 02/19/2018 followed by stem cell transplant on April 03, 2018. -Revlimid maintenance therapy 10 mg 3 weeks on/1 week off started on August 24, 2018. -PET scan on April 24, 2019 shows uptake in the right shoulder likely inflammatory.  Diffuse abnormal bones without focal uptake.  Increased uptake in the tonsils and lymph nodes in the neck and axilla which is inflammatory. -Bone marrow biopsy on April 03, 2019-trilineage hematopoiesis with 4 to 5% plasma cells showing kappa predominance by light chain.  FISH panel negative.  Cytogenetics normal. -Free light chain ratio is 1.53.  Rest of the labs are grossly within normal limits.  SPEP is pending.  Myeloma panel from May 23, 2019 shows M spike and immunofixation normal.  Free light chain ratio was 1.34. -She will start her Revlimid tomorrow.  She has another PET scan at [facility] scheduled on September 01, 2019. -I will see her back in 2 months for follow-up.  2.  Myeloma bone disease: -She is receiving Zometa every 3 months.  Calcium is normal.  Creatinine is 0.8. 

## 2019-07-26 NOTE — Progress Notes (Signed)
[place], [place] [address]   CLINIC:  Medical Oncology/Hematology  PCP:  Asencion Noble, [place]  [id number] [phone]   REASON FOR VISIT: Follow-up for multiple myeloma   BRIEF ONCOLOGIC HISTORY:  Oncology History  Multiple myeloma ([facility])  10/10/2017 Initial Diagnosis   Multiple myeloma ([facility])   11/14/2017 - 02/26/2018 Chemotherapy   The patient had bortezomib SQ (VELCADE) chemo injection 2.5 mg, 1.3 mg/m2 = 2.5 mg, Subcutaneous,  Once, 5 of 6 cycles Administration: 2.5 mg (11/14/2017), 2.5 mg (11/21/2017), 2.5 mg (11/28/2017), 2.5 mg (12/14/2017), 2.5 mg (12/21/2017), 2.5 mg (12/28/2017), 2.5 mg (01/04/2018), 2.5 mg (01/11/2018), 2.5 mg (01/18/2018), 2.5 mg (01/25/2018), 2.5 mg (02/01/2018), 2.5 mg (02/08/2018), 2.5 mg (02/19/2018), 2.5 mg (02/26/2018)  for chemotherapy treatment.       INTERVAL HISTORY:  [name] 79 y.o. female seen for follow-up of multiple myeloma.  Denies any new onset pains.  Reports mild fatigue.  Numbness in the feet has been stable.  This has been off week for her from Revlimid.  Denies any fevers or infections.  Appetite is 100%.  Energy levels are 50%.    REVIEW OF SYSTEMS:  Review of Systems  Constitutional: Positive for fatigue.  Neurological: Positive for numbness.  All other systems reviewed and are negative.    PAST MEDICAL/SURGICAL HISTORY:  Past Medical History:  Diagnosis Date  . Back pain   . Hypercholesteremia   . Hypertension    Past Surgical History:  Procedure Laterality Date  . BONE MARROW BIOPSY    . LAMINECTOMY N/A 10/06/2017   Procedure: THORACIC NINE AND TEN LAMINECTOMY WITH RESECTION OF TUMOR, THORACIC EIGHT TO THORACIC ELEVEN FUSION WITH PEDICLE SCREW FIXATION;  Surgeon: Earnie Larsson, MD;  Location: [facility];  Service: Neurosurgery;  Laterality: N/A;  . TOTAL KNEE ARTHROPLASTY  2001     SOCIAL HISTORY:  Social History   Socioeconomic History  . Marital  status: Widowed    Spouse name: Not on file  . Number of children: 2  . Years of education: Not on file  . Highest education level: Not on file  Occupational History  . Not on file  Tobacco Use  . Smoking status: Former Smoker    Packs/day: 0.50    Years: 30.00    Pack years: 15.00    Types: Cigarettes    Quit date: 05/31/2006    Years since quitting: 13.1  . Smokeless tobacco: Never Used  Substance and Sexual Activity  . Alcohol use: Yes    Comment: wine occassionally  . Drug use: No  . Sexual activity: Not on file  Other Topics Concern  . Not on file  Social History Narrative  . Not on file   Social Determinants of Health   Financial Resource Strain:   . Difficulty of Paying Living Expenses:   Food Insecurity:   . Worried About Charity fundraiser in the Last Year:   . Arboriculturist in the Last Year:   Transportation Needs:   . Film/video editor (Medical):   [name] Lack of Transportation (Non-Medical):   Physical Activity:   . Days of Exercise per Week:   . Minutes of Exercise per Session:   Stress:   . Feeling of Stress :   Social Connections:   . Frequency of Communication with Friends and Family:   . Frequency of Social Gatherings with Friends and Family:   . Attends Religious Services:   .  Active Member of Clubs or Organizations:   . Attends Archivist Meetings:   [name] Marital Status:   Intimate Partner Violence:   . Fear of Current or Ex-Partner:   . Emotionally Abused:   [name] Physically Abused:   . Sexually Abused:     FAMILY HISTORY:  Family History  Problem Relation Age of Onset  . Tuberculosis Mother   . Kidney failure Father   . Hypertension Paternal Aunt   . Diabetes Paternal Aunt   . Hypertension Paternal Uncle   . Diabetes Paternal Uncle   . Hypertension Daughter   . Stroke Daughter     CURRENT MEDICATIONS:  Outpatient Encounter Medications as of 07/25/2019  Medication Sig  . amLODipine (NORVASC) 5 MG tablet Take 5 mg by mouth  daily.   [name] aspirin 81 MG chewable tablet Chew 81 mg by mouth daily.  . calcium carbonate (CALCIUM 600) 1500 (600 Ca) MG TABS tablet Take by mouth.  . DUREZOL 0.05 % EMUL Place 1 drop into the left eye 3 (three) times daily.  [name] gatifloxacin (ZYMAXID) 0.5 % SOLN Place 1 drop into the left eye 4 (four) times daily.  . hydrochlorothiazide (HYDRODIURIL) 25 MG tablet Take 25 mg by mouth daily.  [name] lenalidomide (REVLIMID) 10 MG capsule TAKE 1 CAPSULE ('10MG'$ ) BY MOUTH EVERY DAY FOR 21 DAYS AND THEN TAKE 7 DAYS OFF  . potassium chloride SA (KLOR-CON M20) 20 MEQ tablet Take 1 tablet (20 mEq total) by mouth 2 (two) times daily.  [name] PROLENSA 0.07 % SOLN Place 1 drop into the left eye at bedtime.  . [DISCONTINUED] acyclovir (ZOVIRAX) 800 MG tablet TAKE 1 TABLET TWICE DAILY  . polyethylene glycol (MIRALAX / GLYCOLAX) packet Take 17 g by mouth as needed.   . temazepam (RESTORIL) 15 MG capsule TAKE 1 CAPSULE BY MOUTH AT BEDTIME AS NEEDED FOR SLEEP (Patient not taking: Reported on 07/25/2019)   No facility-administered encounter medications on file as of 07/25/2019.    ALLERGIES:  No Known Allergies   PHYSICAL EXAM:  ECOG Performance status: 1  Vitals:   07/25/19 1156  BP: 140/64  Pulse: 76  Resp: 18  Temp: (!) 96.6 F (35.9 C)  SpO2: 100%   Filed Weights   07/25/19 1156  Weight: 174 lb (78.9 kg)    Physical Exam Constitutional:      Appearance: Normal appearance. She is normal weight.  Cardiovascular:     Rate and Rhythm: Normal rate and regular rhythm.     Heart sounds: Normal heart sounds.  Pulmonary:     Effort: Pulmonary effort is normal.     Breath sounds: Normal breath sounds.  Abdominal:     General: Bowel sounds are normal.     Palpations: Abdomen is soft.  Musculoskeletal:        General: Normal range of motion.  Skin:    General: Skin is warm and dry.  Neurological:     Mental Status: She is alert and oriented to person, place, and time. Mental status is at baseline.    Psychiatric:        Mood and Affect: Mood normal.        Behavior: Behavior normal.        Thought Content: Thought content normal.        Judgment: Judgment normal.      LABORATORY DATA:  I have reviewed the labs as listed.  CBC    Component Value Date/Time   WBC 5.6 07/25/2019 1015  RBC 3.66 (L) 07/25/2019 1015   HGB 12.2 07/25/2019 1015   HCT 37.7 07/25/2019 1015   PLT 232 07/25/2019 1015   MCV 103.0 (H) 07/25/2019 1015   MCH 33.3 07/25/2019 1015   MCHC 32.4 07/25/2019 1015   RDW 14.5 07/25/2019 1015   LYMPHSABS 2.1 07/25/2019 1015   MONOABS 0.8 07/25/2019 1015   EOSABS 0.3 07/25/2019 1015   BASOSABS 0.0 07/25/2019 1015   CMP Latest Ref Rng & Units 07/25/2019 05/23/2019 04/29/2019  Glucose 70 - 99 mg/dL 131(H) 104(H) 94  BUN 8 - 23 mg/dL '18 14 16  '$ Creatinine 0.44 - 1.00 mg/dL 0.81 0.78 0.73  Sodium 135 - 145 mmol/L 136 132(L) 135  Potassium 3.5 - 5.1 mmol/L 4.1 4.0 3.6  Chloride 98 - 111 mmol/L 100 99 101  CO2 22 - 32 mmol/L '26 24 25  '$ Calcium 8.9 - 10.3 mg/dL 9.8 9.4 9.2  Total Protein 6.5 - 8.1 g/dL 8.0 7.4 7.7  Total Bilirubin 0.3 - 1.2 mg/dL 0.5 0.6 0.6  Alkaline Phos 38 - 126 U/L 57 64 65  AST 15 - 41 U/L '17 24 18  '$ ALT 0 - 44 U/L '20 25 22       '$ DIAGNOSTIC IMAGING:  I have reviewed scans.   ASSESSMENT & PLAN:   Multiple myeloma ([facility]) 1.  Stage II IgA kappa multiple myeloma, standard risk: -XRT to the spine, completed on November 10, 2017 following T9-T10 laminectomy with resection of epidural tumor. -5 cycles of RVD from 11/14/2017 through 02/19/2018 followed by stem cell transplant on April 03, 2018. -Revlimid maintenance therapy 10 mg 3 weeks on/1 week off started on August 24, 2018. -PET scan on April 24, 2019 shows uptake in the right shoulder likely inflammatory.  Diffuse abnormal bones without focal uptake.  Increased uptake in the tonsils and lymph nodes in the neck and axilla which is inflammatory. -Bone marrow biopsy on April 03, 2019-trilineage  hematopoiesis with 4 to 5% plasma cells showing kappa predominance by light chain.  FISH panel negative.  Cytogenetics normal. -Free light chain ratio is 1.53.  Rest of the labs are grossly within normal limits.  SPEP is pending.  Myeloma panel from May 23, 2019 shows M spike and immunofixation normal.  Free light chain ratio was 1.34. -She will start her Revlimid tomorrow.  She has another [facility] scan [facility] scheduled on September 01, 2019. -I will see her back in 2 months for follow-up.  2.  Myeloma bone disease: -She is receiving Zometa every 3 months.  Calcium is normal.  Creatinine is 0.8.  Orders placed this encounter:  No orders of the defined types were placed in this encounter.     Derek Jack, MD [place] [phone]

## 2019-07-27 LAB — PROTEIN ELECTROPHORESIS, SERUM
A/G Ratio: 0.9 (ref 0.7–1.7)
Albumin ELP: 3.6 g/dL (ref 2.9–4.4)
Alpha-1-Globulin: 0.3 g/dL (ref 0.0–0.4)
Alpha-2-Globulin: 0.9 g/dL (ref 0.4–1.0)
Beta Globulin: 1.4 g/dL — ABNORMAL HIGH (ref 0.7–1.3)
Gamma Globulin: 1.2 g/dL (ref 0.4–1.8)
Globulin, Total: 3.8 g/dL (ref 2.2–3.9)
Total Protein ELP: 7.4 g/dL (ref 6.0–8.5)

## 2019-08-02 ENCOUNTER — Other Ambulatory Visit (HOSPITAL_COMMUNITY): Payer: Self-pay | Admitting: Hematology

## 2019-08-02 DIAGNOSIS — C9001 Multiple myeloma in remission: Secondary | ICD-10-CM

## 2019-08-22 ENCOUNTER — Inpatient Hospital Stay (HOSPITAL_COMMUNITY): Payer: Medicare HMO | Attending: Hematology

## 2019-08-22 ENCOUNTER — Other Ambulatory Visit: Payer: Self-pay

## 2019-08-22 ENCOUNTER — Inpatient Hospital Stay (HOSPITAL_COMMUNITY): Payer: Medicare HMO

## 2019-08-22 ENCOUNTER — Encounter (HOSPITAL_COMMUNITY): Payer: Self-pay

## 2019-08-22 VITALS — BP 132/54 | HR 76 | Temp 97.7°F | Resp 18 | Wt 173.2 lb

## 2019-08-22 DIAGNOSIS — C9000 Multiple myeloma not having achieved remission: Secondary | ICD-10-CM | POA: Insufficient documentation

## 2019-08-22 DIAGNOSIS — C9001 Multiple myeloma in remission: Secondary | ICD-10-CM

## 2019-08-22 LAB — COMPREHENSIVE METABOLIC PANEL
ALT: 26 U/L (ref 0–44)
AST: 22 U/L (ref 15–41)
Albumin: 4.1 g/dL (ref 3.5–5.0)
Alkaline Phosphatase: 57 U/L (ref 38–126)
Anion gap: 11 (ref 5–15)
BUN: 20 mg/dL (ref 8–23)
CO2: 23 mmol/L (ref 22–32)
Calcium: 9.6 mg/dL (ref 8.9–10.3)
Chloride: 102 mmol/L (ref 98–111)
Creatinine, Ser: 0.86 mg/dL (ref 0.44–1.00)
GFR calc Af Amer: >60 mL/min (ref >60)
GFR calc non Af Amer: >60 mL/min (ref >60)
Glucose, Bld: 108 mg/dL — ABNORMAL HIGH (ref 70–99)
Potassium: 3.8 mmol/L (ref 3.5–5.1)
Sodium: 136 mmol/L (ref 135–145)
Total Bilirubin: 0.5 mg/dL (ref 0.3–1.2)
Total Protein: 7.9 g/dL (ref 6.5–8.1)

## 2019-08-22 LAB — CBC WITH DIFFERENTIAL/PLATELET
Abs Immature Granulocytes: 0.01 10*3/uL (ref 0.00–0.07)
Basophils Absolute: 0 10*3/uL (ref 0.0–0.1)
Basophils Relative: 1 %
Eosinophils Absolute: 0.3 10*3/uL (ref 0.0–0.5)
Eosinophils Relative: 5 %
HCT: 37.6 % (ref 36.0–46.0)
Hemoglobin: 12.5 g/dL (ref 12.0–15.0)
Immature Granulocytes: 0 %
Lymphocytes Relative: 32 %
Lymphs Abs: 1.9 10*3/uL (ref 0.7–4.0)
MCH: 34.2 pg — ABNORMAL HIGH (ref 26.0–34.0)
MCHC: 33.2 g/dL (ref 30.0–36.0)
MCV: 103 fL — ABNORMAL HIGH (ref 80.0–100.0)
Monocytes Absolute: 0.9 10*3/uL (ref 0.1–1.0)
Monocytes Relative: 15 %
Neutro Abs: 2.8 10*3/uL (ref 1.7–7.7)
Neutrophils Relative %: 47 %
Platelets: 233 10*3/uL (ref 150–400)
RBC: 3.65 MIL/uL — ABNORMAL LOW (ref 3.87–5.11)
RDW: 14.1 % (ref 11.5–15.5)
WBC: 5.9 10*3/uL (ref 4.0–10.5)
nRBC: 0 % (ref 0.0–0.2)

## 2019-08-22 LAB — LACTATE DEHYDROGENASE: LDH: 121 U/L (ref 98–192)

## 2019-08-22 MED ORDER — ZOLEDRONIC ACID 4 MG/100ML IV SOLN
4.0000 mg | Freq: Once | INTRAVENOUS | Status: AC
  Administered 2019-08-22: 4 mg via INTRAVENOUS
  Filled 2019-08-22: qty 100

## 2019-08-22 MED ORDER — ZOLEDRONIC ACID 4 MG/5ML IV CONC: 4.0000 mg | Freq: Once | INTRAVENOUS | Status: DC

## 2019-08-22 MED ORDER — SODIUM CHLORIDE 0.9 % IV SOLN: INTRAVENOUS | Status: DC

## 2019-08-22 NOTE — Progress Notes (Signed)
Patient here for zometa today. Labs within range for treatment, no dental issues at this time.    Patient tolerated it well without problems. Vitals stable and discharged home from clinic ambulatory. Follow up as scheduled.

## 2019-08-23 LAB — PROTEIN ELECTROPHORESIS, SERUM
A/G Ratio: 1 (ref 0.7–1.7)
Albumin ELP: 3.7 g/dL (ref 2.9–4.4)
Alpha-1-Globulin: 0.2 g/dL (ref 0.0–0.4)
Alpha-2-Globulin: 0.8 g/dL (ref 0.4–1.0)
Beta Globulin: 1.3 g/dL (ref 0.7–1.3)
Gamma Globulin: 1.3 g/dL (ref 0.4–1.8)
Globulin, Total: 3.6 g/dL (ref 2.2–3.9)
Total Protein ELP: 7.3 g/dL (ref 6.0–8.5)

## 2019-08-23 LAB — KAPPA/LAMBDA LIGHT CHAINS
Kappa free light chain: 28.9 mg/L — ABNORMAL HIGH (ref 3.3–19.4)
Kappa, lambda light chain ratio: 1.41 (ref 0.26–1.65)
Lambda free light chains: 20.5 mg/L (ref 5.7–26.3)

## 2019-08-26 LAB — IMMUNOFIXATION ELECTROPHORESIS
IgA: 644 mg/dL — ABNORMAL HIGH (ref 64–422)
IgG (Immunoglobin G), Serum: 1184 mg/dL (ref 586–1602)
IgM (Immunoglobulin M), Srm: 17 mg/dL — ABNORMAL LOW (ref 26–217)
Total Protein ELP: 7.3 g/dL (ref 6.0–8.5)

## 2019-08-31 ENCOUNTER — Other Ambulatory Visit (HOSPITAL_COMMUNITY): Payer: Self-pay | Admitting: Hematology

## 2019-08-31 DIAGNOSIS — C9001 Multiple myeloma in remission: Secondary | ICD-10-CM

## 2019-09-02 DIAGNOSIS — M899 Disorder of bone, unspecified: Secondary | ICD-10-CM | POA: Diagnosis not present

## 2019-09-02 DIAGNOSIS — C9001 Multiple myeloma in remission: Secondary | ICD-10-CM | POA: Diagnosis not present

## 2019-09-02 DIAGNOSIS — Z9484 Stem cells transplant status: Secondary | ICD-10-CM | POA: Diagnosis not present

## 2019-09-02 DIAGNOSIS — R590 Localized enlarged lymph nodes: Secondary | ICD-10-CM | POA: Diagnosis not present

## 2019-09-08 ENCOUNTER — Other Ambulatory Visit (HOSPITAL_COMMUNITY): Payer: Self-pay | Admitting: Nurse Practitioner

## 2019-09-08 DIAGNOSIS — E876 Hypokalemia: Secondary | ICD-10-CM

## 2019-09-09 DIAGNOSIS — Z1231 Encounter for screening mammogram for malignant neoplasm of breast: Secondary | ICD-10-CM | POA: Diagnosis not present

## 2019-09-12 DIAGNOSIS — Z9484 Stem cells transplant status: Secondary | ICD-10-CM | POA: Diagnosis not present

## 2019-09-12 DIAGNOSIS — R5383 Other fatigue: Secondary | ICD-10-CM | POA: Diagnosis not present

## 2019-09-12 DIAGNOSIS — Z87891 Personal history of nicotine dependence: Secondary | ICD-10-CM | POA: Diagnosis not present

## 2019-09-12 DIAGNOSIS — R1032 Left lower quadrant pain: Secondary | ICD-10-CM | POA: Diagnosis not present

## 2019-09-12 DIAGNOSIS — E538 Deficiency of other specified B group vitamins: Secondary | ICD-10-CM | POA: Diagnosis not present

## 2019-09-12 DIAGNOSIS — C9001 Multiple myeloma in remission: Secondary | ICD-10-CM | POA: Diagnosis not present

## 2019-09-12 DIAGNOSIS — R599 Enlarged lymph nodes, unspecified: Secondary | ICD-10-CM | POA: Diagnosis not present

## 2019-09-12 DIAGNOSIS — G629 Polyneuropathy, unspecified: Secondary | ICD-10-CM | POA: Diagnosis not present

## 2019-09-12 DIAGNOSIS — Z23 Encounter for immunization: Secondary | ICD-10-CM | POA: Diagnosis not present

## 2019-09-12 DIAGNOSIS — Z79899 Other long term (current) drug therapy: Secondary | ICD-10-CM | POA: Diagnosis not present

## 2019-09-26 ENCOUNTER — Inpatient Hospital Stay (HOSPITAL_COMMUNITY): Payer: Medicare HMO | Attending: Hematology

## 2019-09-26 ENCOUNTER — Other Ambulatory Visit: Payer: Self-pay

## 2019-09-26 ENCOUNTER — Other Ambulatory Visit (HOSPITAL_COMMUNITY): Payer: Self-pay | Admitting: Hematology

## 2019-09-26 DIAGNOSIS — M899 Disorder of bone, unspecified: Secondary | ICD-10-CM | POA: Insufficient documentation

## 2019-09-26 DIAGNOSIS — C9000 Multiple myeloma not having achieved remission: Secondary | ICD-10-CM | POA: Diagnosis not present

## 2019-09-26 DIAGNOSIS — C9001 Multiple myeloma in remission: Secondary | ICD-10-CM

## 2019-09-26 DIAGNOSIS — Z87891 Personal history of nicotine dependence: Secondary | ICD-10-CM | POA: Insufficient documentation

## 2019-09-26 DIAGNOSIS — I10 Essential (primary) hypertension: Secondary | ICD-10-CM | POA: Insufficient documentation

## 2019-09-26 DIAGNOSIS — Z7982 Long term (current) use of aspirin: Secondary | ICD-10-CM | POA: Insufficient documentation

## 2019-09-26 LAB — CBC WITH DIFFERENTIAL/PLATELET
Abs Immature Granulocytes: 0.01 10*3/uL (ref 0.00–0.07)
Basophils Absolute: 0 10*3/uL (ref 0.0–0.1)
Basophils Relative: 1 %
Eosinophils Absolute: 0.1 10*3/uL (ref 0.0–0.5)
Eosinophils Relative: 4 %
HCT: 38.9 % (ref 36.0–46.0)
Hemoglobin: 12.8 g/dL (ref 12.0–15.0)
Immature Granulocytes: 0 %
Lymphocytes Relative: 34 %
Lymphs Abs: 1.3 10*3/uL (ref 0.7–4.0)
MCH: 34.3 pg — ABNORMAL HIGH (ref 26.0–34.0)
MCHC: 32.9 g/dL (ref 30.0–36.0)
MCV: 104.3 fL — ABNORMAL HIGH (ref 80.0–100.0)
Monocytes Absolute: 0.4 10*3/uL (ref 0.1–1.0)
Monocytes Relative: 11 %
Neutro Abs: 1.9 10*3/uL (ref 1.7–7.7)
Neutrophils Relative %: 50 %
Platelets: 225 10*3/uL (ref 150–400)
RBC: 3.73 MIL/uL — ABNORMAL LOW (ref 3.87–5.11)
RDW: 14.4 % (ref 11.5–15.5)
WBC: 3.9 10*3/uL — ABNORMAL LOW (ref 4.0–10.5)
nRBC: 0 % (ref 0.0–0.2)

## 2019-09-26 LAB — COMPREHENSIVE METABOLIC PANEL
ALT: 22 U/L (ref 0–44)
AST: 19 U/L (ref 15–41)
Albumin: 4 g/dL (ref 3.5–5.0)
Alkaline Phosphatase: 51 U/L (ref 38–126)
Anion gap: 13 (ref 5–15)
BUN: 16 mg/dL (ref 8–23)
CO2: 22 mmol/L (ref 22–32)
Calcium: 9.5 mg/dL (ref 8.9–10.3)
Chloride: 101 mmol/L (ref 98–111)
Creatinine, Ser: 0.85 mg/dL (ref 0.44–1.00)
GFR calc Af Amer: >60 mL/min (ref >60)
GFR calc non Af Amer: >60 mL/min (ref >60)
Glucose, Bld: 123 mg/dL — ABNORMAL HIGH (ref 70–99)
Potassium: 3.8 mmol/L (ref 3.5–5.1)
Sodium: 136 mmol/L (ref 135–145)
Total Bilirubin: 0.8 mg/dL (ref 0.3–1.2)
Total Protein: 7.9 g/dL (ref 6.5–8.1)

## 2019-09-26 LAB — LACTATE DEHYDROGENASE: LDH: 106 U/L (ref 98–192)

## 2019-09-27 LAB — PROTEIN ELECTROPHORESIS, SERUM
A/G Ratio: 1 (ref 0.7–1.7)
Albumin ELP: 3.8 g/dL (ref 2.9–4.4)
Alpha-1-Globulin: 0.2 g/dL (ref 0.0–0.4)
Alpha-2-Globulin: 0.8 g/dL (ref 0.4–1.0)
Beta Globulin: 1.5 g/dL — ABNORMAL HIGH (ref 0.7–1.3)
Gamma Globulin: 1.1 g/dL (ref 0.4–1.8)
Globulin, Total: 3.7 g/dL (ref 2.2–3.9)
Total Protein ELP: 7.5 g/dL (ref 6.0–8.5)

## 2019-09-27 LAB — KAPPA/LAMBDA LIGHT CHAINS
Kappa free light chain: 32.5 mg/L — ABNORMAL HIGH (ref 3.3–19.4)
Kappa, lambda light chain ratio: 1.48 (ref 0.26–1.65)
Lambda free light chains: 22 mg/L (ref 5.7–26.3)

## 2019-09-30 NOTE — Telephone Encounter (Signed)
Revlimid refilled per Dr. Katragadda 

## 2019-10-01 LAB — IMMUNOFIXATION ELECTROPHORESIS
IgA: 662 mg/dL — ABNORMAL HIGH (ref 64–422)
IgG (Immunoglobin G), Serum: 1234 mg/dL (ref 586–1602)
IgM (Immunoglobulin M), Srm: 16 mg/dL — ABNORMAL LOW (ref 26–217)
Total Protein ELP: 7.3 g/dL (ref 6.0–8.5)

## 2019-10-02 DIAGNOSIS — C9001 Multiple myeloma in remission: Secondary | ICD-10-CM | POA: Diagnosis not present

## 2019-10-02 DIAGNOSIS — R590 Localized enlarged lymph nodes: Secondary | ICD-10-CM | POA: Diagnosis not present

## 2019-10-02 DIAGNOSIS — R599 Enlarged lymph nodes, unspecified: Secondary | ICD-10-CM | POA: Diagnosis not present

## 2019-10-03 ENCOUNTER — Other Ambulatory Visit: Payer: Self-pay

## 2019-10-03 ENCOUNTER — Inpatient Hospital Stay (HOSPITAL_BASED_OUTPATIENT_CLINIC_OR_DEPARTMENT_OTHER): Payer: Medicare HMO | Admitting: Hematology

## 2019-10-03 VITALS — BP 146/63 | HR 86 | Temp 97.0°F | Resp 18 | Wt 171.0 lb

## 2019-10-03 DIAGNOSIS — C9000 Multiple myeloma not having achieved remission: Secondary | ICD-10-CM | POA: Diagnosis not present

## 2019-10-03 DIAGNOSIS — Z87891 Personal history of nicotine dependence: Secondary | ICD-10-CM | POA: Diagnosis not present

## 2019-10-03 DIAGNOSIS — Z7982 Long term (current) use of aspirin: Secondary | ICD-10-CM | POA: Diagnosis not present

## 2019-10-03 DIAGNOSIS — M899 Disorder of bone, unspecified: Secondary | ICD-10-CM | POA: Diagnosis not present

## 2019-10-03 DIAGNOSIS — I10 Essential (primary) hypertension: Secondary | ICD-10-CM | POA: Diagnosis not present

## 2019-10-03 NOTE — Progress Notes (Signed)
[place], [place] [address]   CLINIC:  Medical Oncology/Hematology  PCP:  Asencion Noble, MD [address] / [place] [address]  [phone]  REASON FOR VISIT:  Follow-up for multiple myeloma  PRIOR THERAPY:  1. Radiation through 11/10/2017 2. RVD x 5 cycles from 11/14/2017 to 02/19/2018 3. Stem cell transplant on 04/03/2018  CURRENT THERAPY: Maintenance Revlimid   INTERVAL HISTORY:  [name], a 79 y.o. female, returns for routine follow-up for her multiple myeloma. Jamie Ewing was last seen on 07/25/2019.  Today she reports that she is scheduled for a neck lymph node biopsy on 10/21/2019. She is tolerating the Revlimid well, her appetite is good, denies N/V/D, and her numbness and tingling is stable. She denies any trouble swallowing or soreness in her neck.  She got her second COVID vaccine in her right shoulder on 04/2019.   REVIEW OF SYSTEMS:  Review of Systems  Constitutional: Positive for appetite change (moderately decreased) and fatigue (moderate).  HENT:   Negative for trouble swallowing.   Gastrointestinal: Negative for diarrhea, nausea and vomiting.  Musculoskeletal: Negative for neck pain.  Neurological: Positive for numbness (stable).  All other systems reviewed and are negative.   PAST MEDICAL/SURGICAL HISTORY:  Past Medical History:  Diagnosis Date  . Back pain   . Hypercholesteremia   . Hypertension    Past Surgical History:  Procedure Laterality Date  . BONE MARROW BIOPSY    . LAMINECTOMY N/A 10/06/2017   Procedure: THORACIC NINE AND TEN LAMINECTOMY WITH RESECTION OF TUMOR, THORACIC EIGHT TO THORACIC ELEVEN FUSION WITH PEDICLE SCREW FIXATION;  Surgeon: Earnie Larsson, MD;  Location: [facility];  Service: Neurosurgery;  Laterality: N/A;  . TOTAL KNEE ARTHROPLASTY  2001    SOCIAL HISTORY:  Social History   Socioeconomic History  . Marital status: Widowed    Spouse name: Not on file  . Number of  children: 2  . Years of education: Not on file  . Highest education level: Not on file  Occupational History  . Not on file  Tobacco Use  . Smoking status: Former Smoker    Packs/day: 0.50    Years: 30.00    Pack years: 15.00    Types: Cigarettes    Quit date: 05/31/2006    Years since quitting: 13.3  . Smokeless tobacco: Never Used  Vaping Use  . Vaping Use: Never used  Substance and Sexual Activity  . Alcohol use: Yes    Comment: wine occassionally  . Drug use: No  . Sexual activity: Not on file  Other Topics Concern  . Not on file  Social History Narrative  . Not on file   Social Determinants of Health   Financial Resource Strain:   . Difficulty of Paying Living Expenses:   Food Insecurity:   . Worried About Charity fundraiser in the Last Year:   . Arboriculturist in the Last Year:   Transportation Needs:   . Film/video editor (Medical):   [name] Lack of Transportation (Non-Medical):   Physical Activity:   . Days of Exercise per Week:   . Minutes of Exercise per Session:   Stress:   . Feeling of Stress :   Social Connections:   . Frequency of Communication with Friends and Family:   . Frequency of Social Gatherings with Friends and Family:   . Attends Religious Services:   . Active Member of Clubs or Organizations:   .  Attends Archivist Meetings:   [name] Marital Status:   Intimate Partner Violence:   . Fear of Current or Ex-Partner:   . Emotionally Abused:   [name] Physically Abused:   . Sexually Abused:     FAMILY HISTORY:  Family History  Problem Relation Age of Onset  . Tuberculosis Mother   . Kidney failure Father   . Hypertension Paternal Aunt   . Diabetes Paternal Aunt   . Hypertension Paternal Uncle   . Diabetes Paternal Uncle   . Hypertension Daughter   . Stroke Daughter     CURRENT MEDICATIONS:  Current Outpatient Medications  Medication Sig Dispense Refill  . acyclovir (ZOVIRAX) 800 MG tablet TAKE 1 TABLET TWICE DAILY 180 tablet 1    . amLODipine (NORVASC) 5 MG tablet Take 5 mg by mouth daily.     [name] aspirin 81 MG chewable tablet Chew 81 mg by mouth daily.    . calcium carbonate (CALCIUM 600) 1500 (600 Ca) MG TABS tablet Take by mouth.    . Cyanocobalamin 2500 MCG SUBL Place under the tongue.    . hydrochlorothiazide (HYDRODIURIL) 25 MG tablet Take 25 mg by mouth daily.    [name] KLOR-CON M20 20 MEQ tablet TAKE 1 TABLET BY MOUTH TWICE A DAY 180 tablet 0  . lenalidomide (REVLIMID) 10 MG capsule TAKE 1 CAPSULE (10 MG) BY MOUTH EVERY DAY FOR 21 DAYS AND THEN TAKE 7 DAYS OFF 21 capsule 0  . Multiple Vitamins-Minerals (THERA-M) TABS Thera-M 9 mg iron-400 mcg tablet    . polyethylene glycol (MIRALAX / GLYCOLAX) packet Take 17 g by mouth as needed.  (Patient not taking: Reported on 10/03/2019)    . temazepam (RESTORIL) 15 MG capsule TAKE 1 CAPSULE BY MOUTH AT BEDTIME AS NEEDED FOR SLEEP (Patient not taking: Reported on 07/25/2019) 30 capsule 0   No current facility-administered medications for this visit.    ALLERGIES:  No Known Allergies  PHYSICAL EXAM:  Performance status (ECOG): 1 - Symptomatic but completely ambulatory  Vitals:   10/03/19 1138  BP: (!) 146/63  Pulse: 86  Resp: 18  Temp: (!) 97 F (36.1 C)  SpO2: 100%   Wt Readings from Last 3 Encounters:  10/03/19 171 lb (77.6 kg)  08/22/19 173 lb 3.2 oz (78.6 kg)  07/25/19 174 lb (78.9 kg)   Physical Exam Vitals reviewed.  Constitutional:      Appearance: Normal appearance.  Cardiovascular:     Rate and Rhythm: Normal rate and regular rhythm.     Pulses: Normal pulses.     Heart sounds: Normal heart sounds.  Pulmonary:     Effort: Pulmonary effort is normal.     Breath sounds: Normal breath sounds.  Abdominal:     Palpations: Abdomen is soft. There is no mass.     Tenderness: There is no abdominal tenderness.  Musculoskeletal:     Right lower leg: No edema.     Left lower leg: No edema.  Neurological:     General: No focal deficit present.     Mental  Status: She is alert and oriented to person, place, and time.  Psychiatric:        Mood and Affect: Mood normal.        Behavior: Behavior normal.     LABORATORY DATA:  I have reviewed the labs as listed.  CBC Latest Ref Rng & Units 09/26/2019 08/22/2019 07/25/2019  WBC 4.0 - 10.5 K/uL 3.9(L) 5.9 5.6  Hemoglobin 12.0 - 15.0 g/dL  12.8 12.5 12.2  Hematocrit 36 - 46 % 38.9 37.6 37.7  Platelets 150 - 400 K/uL 225 233 232   CMP Latest Ref Rng & Units 09/26/2019 08/22/2019 07/25/2019  Glucose 70 - 99 mg/dL 123(H) 108(H) 131(H)  BUN 8 - 23 mg/dL '16 20 18  '$ Creatinine 0.44 - 1.00 mg/dL 0.85 0.86 0.81  Sodium 135 - 145 mmol/L 136 136 136  Potassium 3.5 - 5.1 mmol/L 3.8 3.8 4.1  Chloride 98 - 111 mmol/L 101 102 100  CO2 22 - 32 mmol/L '22 23 26  '$ Calcium 8.9 - 10.3 mg/dL 9.5 9.6 9.8  Total Protein 6.5 - 8.1 g/dL 7.9 7.9 8.0  Total Bilirubin 0.3 - 1.2 mg/dL 0.8 0.5 0.5  Alkaline Phos 38 - 126 U/L 51 57 57  AST 15 - 41 U/L '19 22 17  '$ ALT 0 - 44 U/L '22 26 20      '$ Component Value Date/Time   RBC 3.73 (L) 09/26/2019 1115   MCV 104.3 (H) 09/26/2019 1115   MCH 34.3 (H) 09/26/2019 1115   MCHC 32.9 09/26/2019 1115   RDW 14.4 09/26/2019 1115   LYMPHSABS 1.3 09/26/2019 1115   MONOABS 0.4 09/26/2019 1115   EOSABS 0.1 09/26/2019 1115   BASOSABS 0.0 09/26/2019 1115   Lab Results  Component Value Date   TOTALPROTELP 7.5 09/26/2019   TOTALPROTELP 7.3 09/26/2019   ALBUMINELP 3.8 09/26/2019   A1GS 0.2 09/26/2019   A2GS 0.8 09/26/2019   BETS 1.5 (H) 09/26/2019   GAMS 1.1 09/26/2019   MSPIKE Not Observed 09/26/2019   SPEI Comment 09/26/2019    Lab Results  Component Value Date   KPAFRELGTCHN 32.5 (H) 09/26/2019   LAMBDASER 22.0 09/26/2019   KAPLAMBRATIO 1.48 09/26/2019   Lab Results  Component Value Date   LDH 106 09/26/2019   LDH 121 08/22/2019   LDH 120 07/25/2019    DIAGNOSTIC IMAGING:  I have independently reviewed the scans and discussed with the patient. No results found.    ASSESSMENT:  1.  Stage II IgA kappa multiple myeloma, standard risk: -XRT to the spine completed on November 10, 2017 following T9-T10 laminectomy with resection of epidural tumor. -5 cycles of RVD from 11/14/2017 through 02/19/2018 followed by stem cell transplant on April 03, 2018. -Revlimid maintenance therapy 10 mg 3 weeks on/1 week off started on August 24, 2018. -PET scan on 09/02/2019 showed redemonstrated bilateral cervical, right supraclavicular, right axillary hypermetabolic adenopathy with interval resolution of left axillary adenopathy.  Similar lytic and sclerotic lesions in bones with no corresponding FDG uptake.  Persistent increased FDG uptake in both palatine tonsils. -Reviewed myeloma panel from 09/26/2019.  SPEP and immunofixation is negative.  Free light chain ratio is normal with kappa light chains elevated at 32.5.  2.  Myeloma bone disease: -Zometa was started on 11/21/2017, resumed after transplant on 09/06/2018.  PLAN:  1.  Stage II IgA kappa multiple myeloma, standard risk: -I have reviewed her recent labs from 09/26/2019 which did not show any evidence of recurrence of myeloma. -She will continue Revlimid 3 weeks on 1 week off maintenance and she is tolerating it very well. -She has an appointment at [facility] for right neck lymph node biopsy. -I will see her back in end of August to discuss results.  2.  Myeloma bone disease: -Continue Zometa every 3 months.  3.  Thromboprophylaxis: -Continue aspirin 81 mg daily.  4.  Hypertension: -Continue amlodipine, hydrochlorothiazide.  Orders placed this encounter:  No orders of the  defined types were placed in this encounter.    Derek Jack, MD [place] [phone]   I, Milinda Antis, am acting as a scribe for Dr. Sanda Linger.  I, Derek Jack MD, have reviewed the above documentation for accuracy and completeness, and I agree with the above.

## 2019-10-03 NOTE — Patient Instructions (Signed)
[place] at [facility] Discharge Instructions  You were seen today by Dr. Delton Coombes. He went over your recent results. Continue your usual care. Dr. Delton Coombes will see you back in 2 months for labs and follow up.   Thank you for choosing [place] at [facility] to provide your oncology and hematology care.  To afford each patient quality time with our provider, please arrive at least 15 minutes before your scheduled appointment time.   If you have a lab appointment with the [facility] please come in thru the Main Entrance and check in at the main information desk  You need to re-schedule your appointment should you arrive 10 or more minutes late.  We strive to give you quality time with our providers, and arriving late affects you and other patients whose appointments are after yours.  Also, if you no show three or more times for appointments you may be dismissed from the clinic at the providers discretion.     Again, thank you for choosing [facility].  Our hope is that these requests will decrease the amount of time that you wait before being seen by our physicians.       _____________________________________________________________  Should you have questions after your visit to [facility], please contact our office at [phone] between the hours of 8:00 a.m. and 4:30 p.m.  Voicemails left after 4:00 p.m. will not be returned until the following business day.  For prescription refill requests, have your pharmacy contact our office and allow 72 hours.    Cancer Center Support Programs:   > Cancer Support Group  2nd Tuesday of the month 1pm-2pm, Journey Room

## 2019-10-16 ENCOUNTER — Other Ambulatory Visit (HOSPITAL_COMMUNITY): Payer: Self-pay | Admitting: Nurse Practitioner

## 2019-10-16 DIAGNOSIS — E876 Hypokalemia: Secondary | ICD-10-CM

## 2019-10-20 ENCOUNTER — Other Ambulatory Visit (HOSPITAL_COMMUNITY): Payer: Self-pay | Admitting: Hematology

## 2019-10-20 DIAGNOSIS — C9001 Multiple myeloma in remission: Secondary | ICD-10-CM

## 2019-10-21 DIAGNOSIS — C9001 Multiple myeloma in remission: Secondary | ICD-10-CM | POA: Diagnosis not present

## 2019-10-21 DIAGNOSIS — D72822 Plasmacytosis: Secondary | ICD-10-CM | POA: Diagnosis not present

## 2019-10-21 DIAGNOSIS — Z923 Personal history of irradiation: Secondary | ICD-10-CM | POA: Diagnosis not present

## 2019-10-21 DIAGNOSIS — C9000 Multiple myeloma not having achieved remission: Secondary | ICD-10-CM | POA: Diagnosis not present

## 2019-10-21 DIAGNOSIS — Z79899 Other long term (current) drug therapy: Secondary | ICD-10-CM | POA: Diagnosis not present

## 2019-10-21 DIAGNOSIS — Z7982 Long term (current) use of aspirin: Secondary | ICD-10-CM | POA: Diagnosis not present

## 2019-10-21 DIAGNOSIS — R590 Localized enlarged lymph nodes: Secondary | ICD-10-CM | POA: Diagnosis not present

## 2019-10-24 NOTE — Telephone Encounter (Signed)
Chart reviewed, per Dr. Tomie China last office note, okay to refill Revlimid.

## 2019-10-31 DIAGNOSIS — R5383 Other fatigue: Secondary | ICD-10-CM | POA: Diagnosis not present

## 2019-10-31 DIAGNOSIS — E538 Deficiency of other specified B group vitamins: Secondary | ICD-10-CM | POA: Diagnosis not present

## 2019-10-31 DIAGNOSIS — Z9189 Other specified personal risk factors, not elsewhere classified: Secondary | ICD-10-CM | POA: Diagnosis not present

## 2019-10-31 DIAGNOSIS — Z79899 Other long term (current) drug therapy: Secondary | ICD-10-CM | POA: Diagnosis not present

## 2019-10-31 DIAGNOSIS — Z923 Personal history of irradiation: Secondary | ICD-10-CM | POA: Diagnosis not present

## 2019-10-31 DIAGNOSIS — J351 Hypertrophy of tonsils: Secondary | ICD-10-CM | POA: Diagnosis not present

## 2019-10-31 DIAGNOSIS — C9000 Multiple myeloma not having achieved remission: Secondary | ICD-10-CM | POA: Diagnosis not present

## 2019-10-31 DIAGNOSIS — Z9484 Stem cells transplant status: Secondary | ICD-10-CM | POA: Diagnosis not present

## 2019-10-31 DIAGNOSIS — Z9221 Personal history of antineoplastic chemotherapy: Secondary | ICD-10-CM | POA: Diagnosis not present

## 2019-10-31 DIAGNOSIS — C9001 Multiple myeloma in remission: Secondary | ICD-10-CM | POA: Diagnosis not present

## 2019-11-05 DIAGNOSIS — R590 Localized enlarged lymph nodes: Secondary | ICD-10-CM | POA: Diagnosis not present

## 2019-11-05 DIAGNOSIS — C9000 Multiple myeloma not having achieved remission: Secondary | ICD-10-CM | POA: Diagnosis not present

## 2019-11-07 ENCOUNTER — Ambulatory Visit: Payer: Self-pay | Attending: Internal Medicine

## 2019-11-07 DIAGNOSIS — Z23 Encounter for immunization: Secondary | ICD-10-CM

## 2019-11-07 NOTE — Progress Notes (Signed)
° °  Covid-19 Vaccination Clinic  Name:  Shomari Matusik    MRN: [id number] DOB: 19-Dec-1940  11/07/2019  Ms. Capano was observed post Covid-19 immunization for 15 minutes without incident. She was provided with Vaccine Information Sheet and instruction to access the V-Safe system.   Ms. Crandle was instructed to call 911 with any severe reactions post vaccine:  Difficulty breathing   Swelling of face and throat   A fast heartbeat   A bad rash all over body   Dizziness and weakness

## 2019-11-13 IMAGING — MR MR LUMBAR SPINE W/O CM
4 of 5 series · 13 of 48 positions shown · non-contrast
Comparison: None.

CLINICAL DATA: Rapidly progressive neurological deficit in the
lower extremities.

EXAM:
MRI LUMBAR SPINE WITHOUT CONTRAST
TECHNIQUE: Multiplanar, multisequence MR imaging of the lumbar spine was
performed. No intravenous contrast was administered.

[Series 3: T2 · sagittal · 4.0mm · 0.78mm/px · 4 of 15 slices shown (1 of 2)]
[im 1/15]
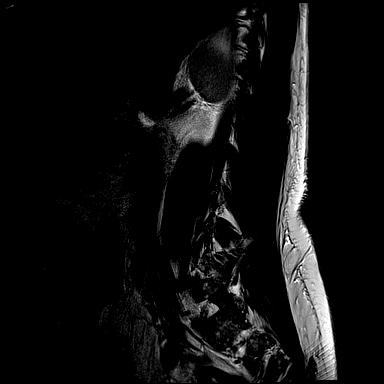
[im 5/15]
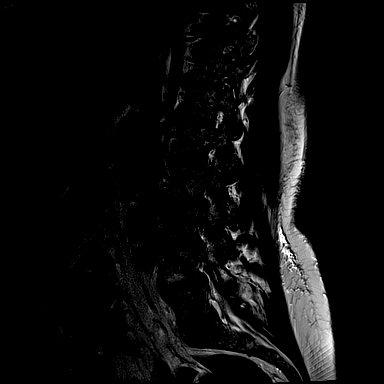
[im 10/15]
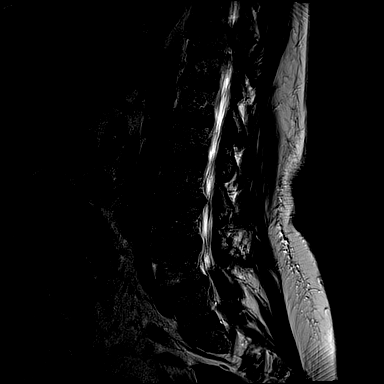
[im 15/15]
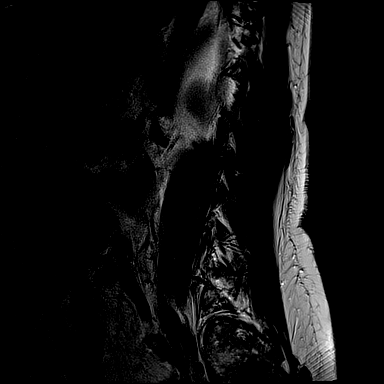

[Series 4: T1 · sagittal · 4.0mm · 0.39mm/px · 3 of 15 slices shown (1 of 2)]
[im 1/15]
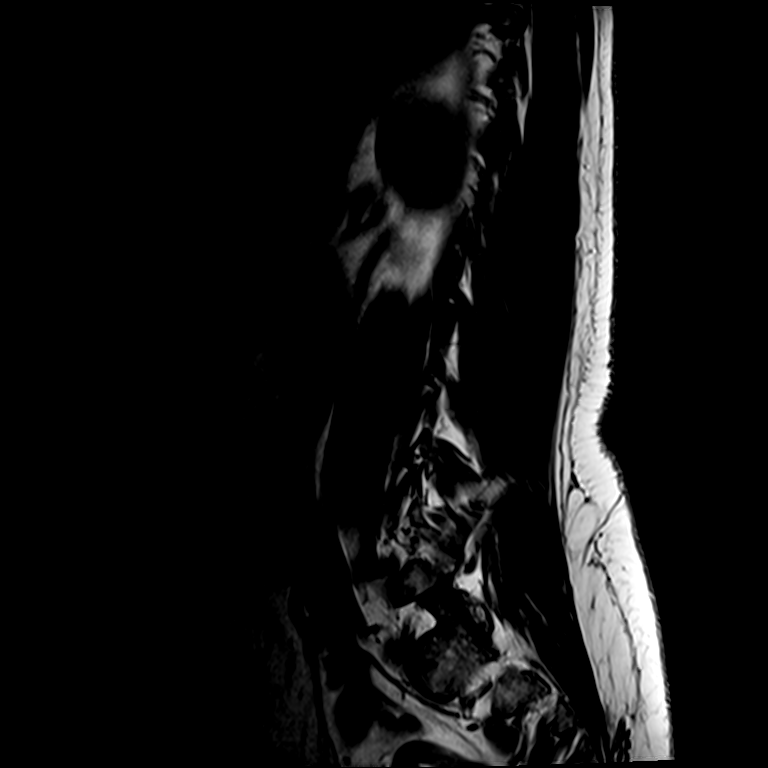
[im 8/15]
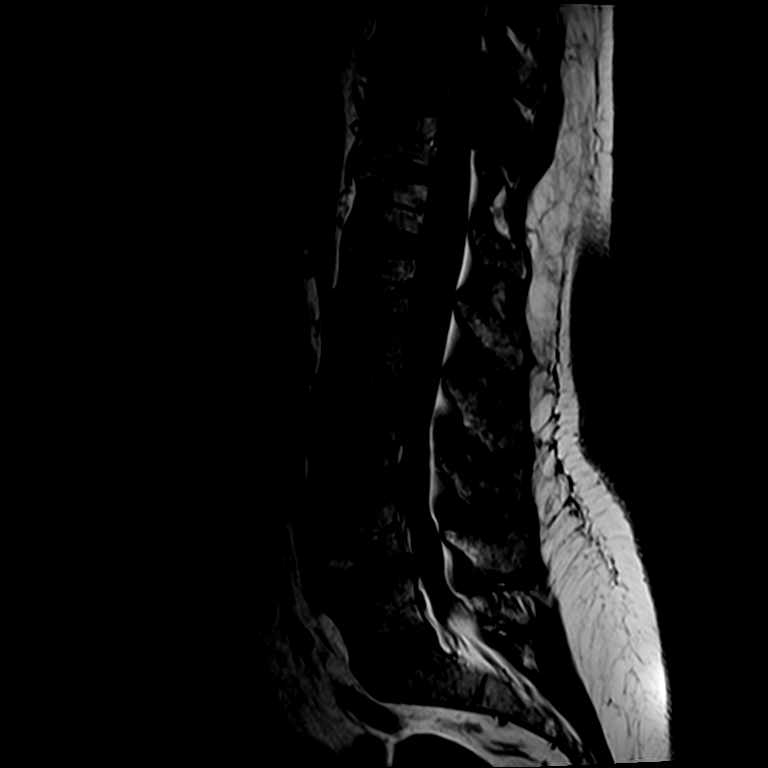
[im 15/15]
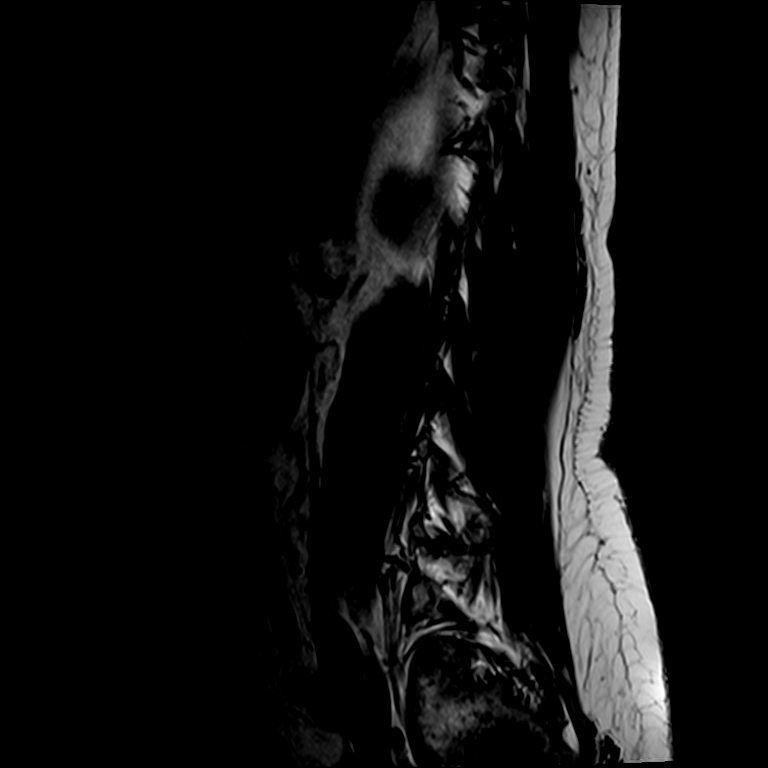

[Series 6: T2 · axial · 4.0mm · 0.22mm/px · z∈[-67,+157]mm · 3 of 53 slices shown (2 of 2)]
[im 7/53]
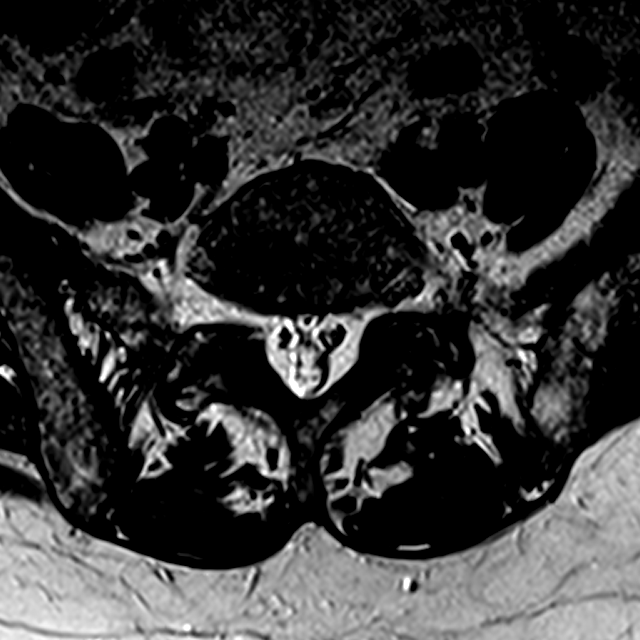
[im 27/53]
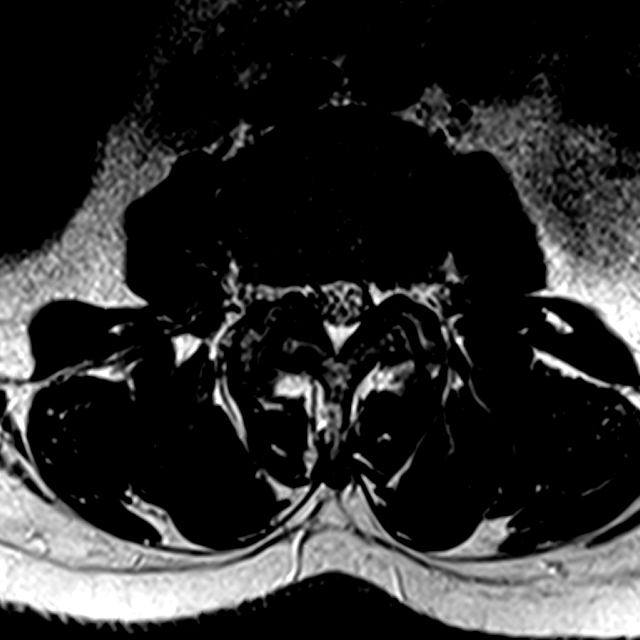
[im 46/53]
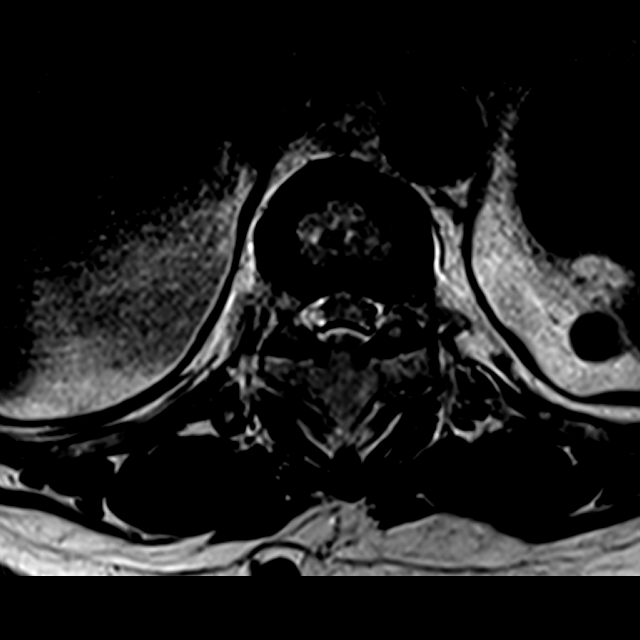

[Series 7: T1 · axial · 4.0mm · 0.23mm/px · z∈[-67,+157]mm · 3 of 53 slices shown (2 of 2)]
[im 7/53]
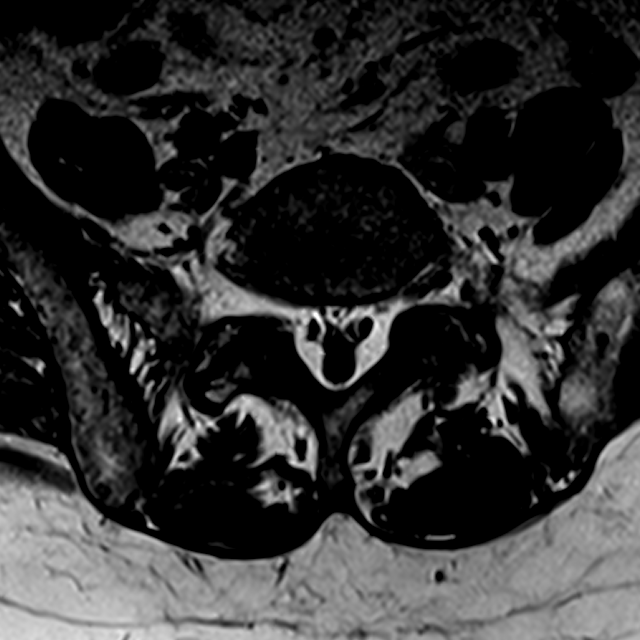
[im 27/53]
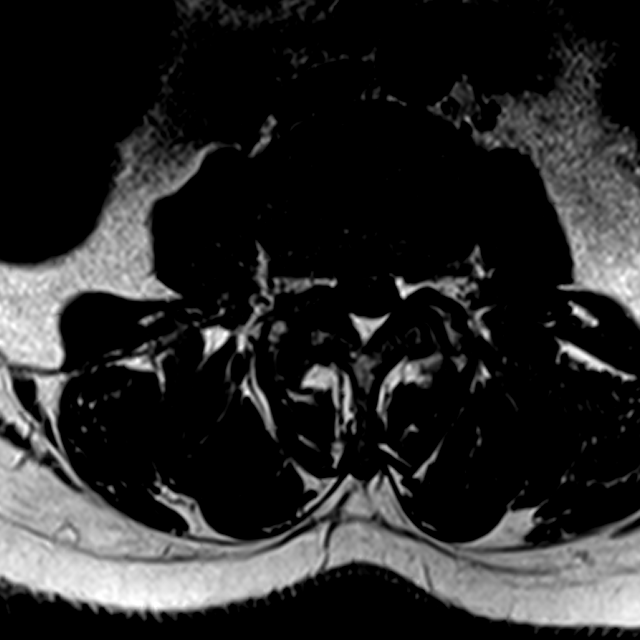
[im 46/53]
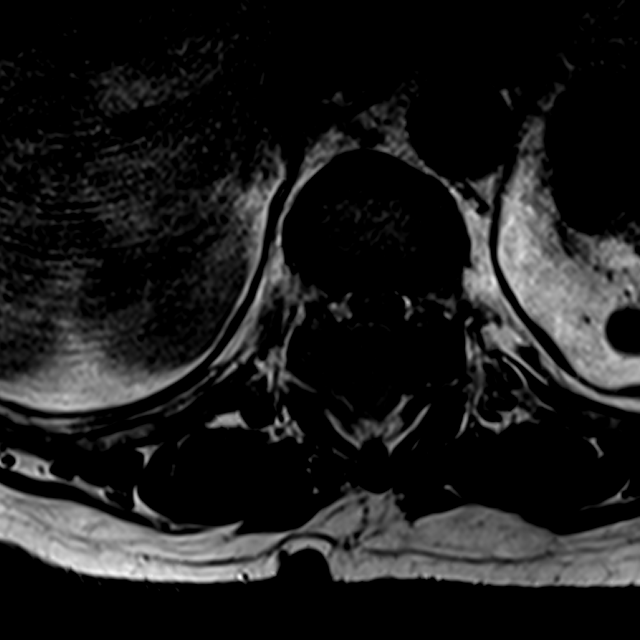

[13 of 48 positions shown; findings below may reference images not displayed]

FINDINGS: Segmentation:  Standard.

Alignment:  Physiologic.

Vertebrae: No discitis or osteomyelitis. Diffuse abnormal T1
hypointense and T2 hyperintense lesions throughout the thoracolumbar
spine and pelvis most consistent with malignancy, multiple myeloma
versus metastatic disease. Abnormal bone lesion most prominent in
the posterior aspect of the T10 vertebral body which extends into
the posterior elements with a 9 x 15 x 44 mm posterior epidural soft
tissue mass. Pathologic compression fracture of the T10 vertebral
body with 6 mm of retropulsion and approximately 50% height loss.
Severe multifactorial spinal stenosis and cord compression of the
lower thoracic spinal cord resulting from the T10 compression
fracture and retropulsion in combination with the posterior epidural
tumor.

Conus medullaris and cauda equina: Conus extends to the T12 level.
Conus and cauda equina appear normal.

Paraspinal and other soft tissues: No acute paraspinal abnormality.
Partially visualized right renal cystic mass.

Disc levels:

Disc spaces: Degenerative disc disease with disc desiccation at
L4-5.

T12-L1: No significant disc bulge. No evidence of neural foraminal
stenosis. No central canal stenosis.

L1-L2: No significant disc bulge. No evidence of neural foraminal
stenosis. No central canal stenosis.

L2-L3: Mild broad-based disc bulge. Mild bilateral facet
arthropathy. No evidence of neural foraminal stenosis. No central
canal stenosis.

L3-L4: Mild broad-based disc bulge. Moderate bilateral facet
arthropathy. No evidence of neural foraminal stenosis. No central
canal stenosis.

L4-L5: Broad-based disc bulge. Moderate bilateral facet arthropathy.
No evidence of neural foraminal stenosis. No central canal stenosis.

L5-S1: No significant disc bulge. No evidence of neural foraminal
stenosis. No central canal stenosis. Moderate bilateral facet
arthropathy.
IMPRESSION: 1. Abnormal bone lesion most prominent in the posterior aspect of
the T10 vertebral body which extends into the posterior elements
with a 9 x 15 x 44 mm posterior epidural soft tissue mass.
Pathologic compression fracture of the T10 vertebral body with 6 mm
of retropulsion. Severe multifactorial spinal stenosis and cord
compression of the lower thoracic spinal cord resulting from the T10
compression fracture and retropulsion in combination with the
posterior epidural tumor.
2. Abnormal bone lesions throughout the thoracolumbar spine and
pelvis most consistent with malignancy, metastatic disease versus
multiple myeloma.

Critical Value/emergent results were called by telephone at the time
of interpretation on 10/06/2017 at [DATE] to Dr. BIZ TIGER ,
who verbally acknowledged these results.

## 2019-11-14 ENCOUNTER — Other Ambulatory Visit: Payer: Self-pay

## 2019-11-14 ENCOUNTER — Inpatient Hospital Stay (HOSPITAL_COMMUNITY): Payer: Medicare HMO | Attending: Hematology

## 2019-11-14 ENCOUNTER — Other Ambulatory Visit (HOSPITAL_COMMUNITY): Payer: Self-pay | Admitting: Hematology

## 2019-11-14 ENCOUNTER — Inpatient Hospital Stay (HOSPITAL_BASED_OUTPATIENT_CLINIC_OR_DEPARTMENT_OTHER): Payer: Medicare HMO | Admitting: Hematology

## 2019-11-14 ENCOUNTER — Other Ambulatory Visit (HOSPITAL_COMMUNITY): Payer: Medicare HMO

## 2019-11-14 VITALS — BP 119/61 | HR 75 | Temp 96.9°F | Resp 18

## 2019-11-14 VITALS — BP 129/74 | HR 86 | Temp 96.9°F | Resp 19 | Wt 169.2 lb

## 2019-11-14 DIAGNOSIS — Z87891 Personal history of nicotine dependence: Secondary | ICD-10-CM | POA: Diagnosis not present

## 2019-11-14 DIAGNOSIS — M898X9 Other specified disorders of bone, unspecified site: Secondary | ICD-10-CM | POA: Insufficient documentation

## 2019-11-14 DIAGNOSIS — C9000 Multiple myeloma not having achieved remission: Secondary | ICD-10-CM

## 2019-11-14 DIAGNOSIS — Z7982 Long term (current) use of aspirin: Secondary | ICD-10-CM | POA: Insufficient documentation

## 2019-11-14 DIAGNOSIS — I10 Essential (primary) hypertension: Secondary | ICD-10-CM | POA: Diagnosis not present

## 2019-11-14 DIAGNOSIS — C9001 Multiple myeloma in remission: Secondary | ICD-10-CM

## 2019-11-14 LAB — CBC WITH DIFFERENTIAL/PLATELET
Abs Immature Granulocytes: 0.01 10*3/uL (ref 0.00–0.07)
Basophils Absolute: 0 10*3/uL (ref 0.0–0.1)
Basophils Relative: 1 %
Eosinophils Absolute: 0.2 10*3/uL (ref 0.0–0.5)
Eosinophils Relative: 3 %
HCT: 38 % (ref 36.0–46.0)
Hemoglobin: 12.3 g/dL (ref 12.0–15.0)
Immature Granulocytes: 0 %
Lymphocytes Relative: 38 %
Lymphs Abs: 1.9 10*3/uL (ref 0.7–4.0)
MCH: 34.3 pg — ABNORMAL HIGH (ref 26.0–34.0)
MCHC: 32.4 g/dL (ref 30.0–36.0)
MCV: 105.8 fL — ABNORMAL HIGH (ref 80.0–100.0)
Monocytes Absolute: 0.6 10*3/uL (ref 0.1–1.0)
Monocytes Relative: 13 %
Neutro Abs: 2.3 10*3/uL (ref 1.7–7.7)
Neutrophils Relative %: 45 %
Platelets: 226 10*3/uL (ref 150–400)
RBC: 3.59 MIL/uL — ABNORMAL LOW (ref 3.87–5.11)
RDW: 13.6 % (ref 11.5–15.5)
WBC: 5 10*3/uL (ref 4.0–10.5)
nRBC: 0 % (ref 0.0–0.2)

## 2019-11-14 LAB — COMPREHENSIVE METABOLIC PANEL
ALT: 16 U/L (ref 0–44)
AST: 15 U/L (ref 15–41)
Albumin: 3.9 g/dL (ref 3.5–5.0)
Alkaline Phosphatase: 51 U/L (ref 38–126)
Anion gap: 12 (ref 5–15)
BUN: 14 mg/dL (ref 8–23)
CO2: 22 mmol/L (ref 22–32)
Calcium: 9.2 mg/dL (ref 8.9–10.3)
Chloride: 101 mmol/L (ref 98–111)
Creatinine, Ser: 0.86 mg/dL (ref 0.44–1.00)
GFR calc Af Amer: >60 mL/min (ref >60)
GFR calc non Af Amer: >60 mL/min (ref >60)
Glucose, Bld: 154 mg/dL — ABNORMAL HIGH (ref 70–99)
Potassium: 4 mmol/L (ref 3.5–5.1)
Sodium: 135 mmol/L (ref 135–145)
Total Bilirubin: 0.4 mg/dL (ref 0.3–1.2)
Total Protein: 7.7 g/dL (ref 6.5–8.1)

## 2019-11-14 LAB — LACTATE DEHYDROGENASE: LDH: 111 U/L (ref 98–192)

## 2019-11-14 MED ORDER — ZOLEDRONIC ACID 4 MG/100ML IV SOLN
INTRAVENOUS | Status: AC
  Filled 2019-11-14: qty 100

## 2019-11-14 MED ORDER — ZOLEDRONIC ACID 4 MG/5ML IV CONC: 4.0000 mg | Freq: Once | INTRAVENOUS | Status: DC

## 2019-11-14 MED ORDER — SODIUM CHLORIDE 0.9 % IV SOLN: Freq: Once | INTRAVENOUS | Status: AC

## 2019-11-14 MED ORDER — ZOLEDRONIC ACID 4 MG/100ML IV SOLN
4.0000 mg | Freq: Once | INTRAVENOUS | Status: AC
  Administered 2019-11-14: 4 mg via INTRAVENOUS

## 2019-11-14 NOTE — Progress Notes (Signed)
Patient presents today for Zometa infusion and follow up visit with Dr. Delton Coombes. Labs reviewed by MD.  Message received to proceed with Zometa today. Calcium 9.2 today. Creatinine 0.86. Vital signs stable.    Zometa given today per MD orders. Tolerated infusion without adverse affects. Vital signs stable. No complaints at this time. Discharged from clinic ambulatory. F/U with [facility] as scheduled.

## 2019-11-14 NOTE — Patient Instructions (Signed)
[facility] at [facility]  Discharge Instructions:   _______________________________________________________________  Thank you for choosing [facility] at [facility] to provide your oncology and hematology care.  To afford each patient quality time with our providers, please arrive at least 15 minutes before your scheduled appointment.  You need to re-schedule your appointment if you arrive 10 or more minutes late.  We strive to give you quality time with our providers, and arriving late affects you and other patients whose appointments are after yours.  Also, if you no show three or more times for appointments you may be dismissed from the clinic.  Again, thank you for choosing [facility] at [facility]. Our hope is that these requests will allow you access to exceptional care and in a timely manner. _______________________________________________________________  If you have questions after your visit, please contact our office at [phone] between the hours of 8:30 a.m. and 5:00 p.m. Voicemails left after 4:30 p.m. will not be returned until the following business day. _______________________________________________________________  For prescription refill requests, have your pharmacy contact our office. _______________________________________________________________  Recommendations made by the consultant and any test results will be sent to your referring physician. _______________________________________________________________ 

## 2019-11-14 NOTE — Progress Notes (Signed)
Negative  [facility] [address], [place] [address]   CLINIC:  Medical Oncology/Hematology  PCP:  Carylon Perches, MD [address] / [place] [address]  [phone]  REASON FOR VISIT:  Follow-up for multiple myeloma  PRIOR THERAPY:  1. Radiation through 11/10/2017. 2. RVD x 5 cycles from 11/14/2017 to 02/19/2018. 3. Stem cell transplant on 04/03/2018.  CURRENT THERAPY: Maintenance Revlimid  INTERVAL HISTORY:  Jamie Ewing, a 79 y.o. female, returns for routine follow-up for her multiple myeloma. Charell was last seen on 10/03/2019.  Today she reports that she had her cervical lymph node excisional biopsy performed on 10/21/2019 at [facility] with Dr. Corey Skains, which showed reactive follicular hyperplasia and mild plasmacytosis with no evidence of malignancy. She reports that the biopsy site is a little sore, otherwise she is tolerating the Revlimid well and denies having N/V and her numbness is stable. She denies having any new bone pains.  She got her booster COVID vaccine on 8/19.   REVIEW OF SYSTEMS:  Review of Systems  Constitutional: Positive for fatigue (mild). Negative for appetite change.  Gastrointestinal: Negative for nausea and vomiting.  Musculoskeletal: Negative for arthralgias.  Neurological: Positive for numbness (toes and 5th fingers; stable).  All other systems reviewed and are negative.   PAST MEDICAL/SURGICAL HISTORY:  Past Medical History:  Diagnosis Date  . Back pain   . Hypercholesteremia   . Hypertension    Past Surgical History:  Procedure Laterality Date  . BONE MARROW BIOPSY    . LAMINECTOMY N/A 10/06/2017   Procedure: THORACIC NINE AND TEN LAMINECTOMY WITH RESECTION OF TUMOR, THORACIC EIGHT TO THORACIC ELEVEN FUSION WITH PEDICLE SCREW FIXATION;  Surgeon: Julio Sicks, MD;  Location: [facility] OR;  Service: Neurosurgery;  Laterality: N/A;  . TOTAL KNEE ARTHROPLASTY  2001    SOCIAL HISTORY:    Social History   Socioeconomic History  . Marital status: Widowed    Spouse name: Not on file  . Number of children: 2  . Years of education: Not on file  . Highest education level: Not on file  Occupational History  . Not on file  Tobacco Use  . Smoking status: Former Smoker    Packs/day: 0.50    Years: 30.00    Pack years: 15.00    Types: Cigarettes    Quit date: 05/31/2006    Years since quitting: 13.4  . Smokeless tobacco: Never Used  Vaping Use  . Vaping Use: Never used  Substance and Sexual Activity  . Alcohol use: Yes    Comment: wine occassionally  . Drug use: No  . Sexual activity: Not on file  Other Topics Concern  . Not on file  Social History Narrative  . Not on file   Social Determinants of Health   Financial Resource Strain:   . Difficulty of Paying Living Expenses: Not on file  Food Insecurity:   . Worried About Programme researcher, broadcasting/film/video in the Last Year: Not on file  . Ran Out of Food in the Last Year: Not on file  Transportation Needs:   . Lack of Transportation (Medical): Not on file  . Lack of Transportation (Non-Medical): Not on file  Physical Activity:   . Days of Exercise per Week: Not on file  . Minutes of Exercise per Session: Not on file  Stress:   . Feeling of Stress : Not on file  Social Connections:   . Frequency of Communication with Friends  and Family: Not on file  . Frequency of Social Gatherings with Friends and Family: Not on file  . Attends Religious Services: Not on file  . Active Member of Clubs or Organizations: Not on file  . Attends Archivist Meetings: Not on file  . Marital Status: Not on file  Intimate Partner Violence:   . Fear of Current or Ex-Partner: Not on file  . Emotionally Abused: Not on file  . Physically Abused: Not on file  . Sexually Abused: Not on file    FAMILY HISTORY:  Family History  Problem Relation Age of Onset  . Tuberculosis Mother   . Kidney failure Father   . Hypertension Paternal  Aunt   . Diabetes Paternal Aunt   . Hypertension Paternal Uncle   . Diabetes Paternal Uncle   . Hypertension Daughter   . Stroke Daughter     CURRENT MEDICATIONS:  Current Outpatient Medications  Medication Sig Dispense Refill  . acyclovir (ZOVIRAX) 800 MG tablet TAKE 1 TABLET TWICE DAILY 180 tablet 1  . amLODipine (NORVASC) 5 MG tablet Take 5 mg by mouth daily.     [name] aspirin 81 MG chewable tablet Chew 81 mg by mouth daily.    . calcium carbonate (CALCIUM 600) 1500 (600 Ca) MG TABS tablet Take by mouth.    . hydrochlorothiazide (HYDRODIURIL) 25 MG tablet Take 25 mg by mouth daily.    [name] lenalidomide (REVLIMID) 10 MG capsule TAKE 1 CAPSULE ($RemoveBe'10MG'glnrsTtUS$ ) BY MOUTH EVERY DAY FOR 21 DAYS AND THEN TAKE 7 DAYS OFF 21 capsule 0  . Multiple Vitamins-Minerals (THERA-M) TABS Thera-M 9 mg iron-400 mcg tablet    . potassium chloride SA (KLOR-CON) 20 MEQ tablet TAKE 1 TABLET TWICE DAILY 180 tablet 0  . acetaminophen (TYLENOL) 500 MG tablet Take by mouth. (Patient not taking: Reported on 11/14/2019)    . polyethylene glycol (MIRALAX / GLYCOLAX) packet Take 17 g by mouth as needed.  (Patient not taking: Reported on 11/14/2019)    . temazepam (RESTORIL) 15 MG capsule TAKE 1 CAPSULE BY MOUTH AT BEDTIME AS NEEDED FOR SLEEP (Patient not taking: Reported on 11/14/2019) 30 capsule 0   No current facility-administered medications for this visit.    ALLERGIES:  No Known Allergies  PHYSICAL EXAM:  Performance status (ECOG): 1 - Symptomatic but completely ambulatory  Vitals:   11/14/19 1303  BP: 129/74  Pulse: 86  Resp: 19  Temp: (!) 96.9 F (36.1 C)  SpO2: 98%   Wt Readings from Last 3 Encounters:  11/14/19 169 lb 3.2 oz (76.7 kg)  10/03/19 171 lb (77.6 kg)  08/22/19 173 lb 3.2 oz (78.6 kg)   Physical Exam Vitals reviewed.  Constitutional:      Appearance: Normal appearance.  Cardiovascular:     Rate and Rhythm: Normal rate and regular rhythm.     Pulses: Normal pulses.     Heart sounds: Normal  heart sounds.  Pulmonary:     Effort: Pulmonary effort is normal.     Breath sounds: Normal breath sounds.  Neurological:     General: No focal deficit present.     Mental Status: She is alert and oriented to person, place, and time.  Psychiatric:        Mood and Affect: Mood normal.        Behavior: Behavior normal.     LABORATORY DATA:  I have reviewed the labs as listed.  CBC Latest Ref Rng & Units 11/14/2019 09/26/2019 08/22/2019  WBC 4.0 -  10.5 K/uL 5.0 3.9(L) 5.9  Hemoglobin 12.0 - 15.0 g/dL 12.3 12.8 12.5  Hematocrit 36 - 46 % 38.0 38.9 37.6  Platelets 150 - 400 K/uL 226 225 233   CMP Latest Ref Rng & Units 11/14/2019 09/26/2019 08/22/2019  Glucose 70 - 99 mg/dL 154(H) 123(H) 108(H)  BUN 8 - 23 mg/dL $Remove'14 16 20  'VhFjTSF$ Creatinine 0.44 - 1.00 mg/dL 0.86 0.85 0.86  Sodium 135 - 145 mmol/L 135 136 136  Potassium 3.5 - 5.1 mmol/L 4.0 3.8 3.8  Chloride 98 - 111 mmol/L 101 101 102  CO2 22 - 32 mmol/L $RemoveB'22 22 23  'ThjKtfJd$ Calcium 8.9 - 10.3 mg/dL 9.2 9.5 9.6  Total Protein 6.5 - 8.1 g/dL 7.7 7.9 7.9  Total Bilirubin 0.3 - 1.2 mg/dL 0.4 0.8 0.5  Alkaline Phos 38 - 126 U/L 51 51 57  AST 15 - 41 U/L $Remo'15 19 22  'SNcjc$ ALT 0 - 44 U/L $Remo'16 22 26      'OyDYh$ Component Value Date/Time   RBC 3.59 (L) 11/14/2019 1148   MCV 105.8 (H) 11/14/2019 1148   MCH 34.3 (H) 11/14/2019 1148   MCHC 32.4 11/14/2019 1148   RDW 13.6 11/14/2019 1148   LYMPHSABS 1.9 11/14/2019 1148   MONOABS 0.6 11/14/2019 1148   EOSABS 0.2 11/14/2019 1148   BASOSABS 0.0 11/14/2019 1148   Lab Results  Component Value Date   LDH 111 11/14/2019   LDH 106 09/26/2019   LDH 121 08/22/2019   Lab Results  Component Value Date   TOTALPROTELP 7.5 09/26/2019   TOTALPROTELP 7.3 09/26/2019   ALBUMINELP 3.8 09/26/2019   A1GS 0.2 09/26/2019   A2GS 0.8 09/26/2019   BETS 1.5 (H) 09/26/2019   GAMS 1.1 09/26/2019   MSPIKE Not Observed 09/26/2019   SPEI Comment 09/26/2019    Lab Results  Component Value Date   KPAFRELGTCHN 32.5 (H) 09/26/2019   LAMBDASER  22.0 09/26/2019   KAPLAMBRATIO 1.48 09/26/2019   Surgical pathology on 10/21/2019 at [facility]: Right cervical lymph node excisional biopsy: reactive hyperplasia and mild plasmacytosis; no evidence of malignancy.  DIAGNOSTIC IMAGING:  I have independently reviewed the scans and discussed with the patient. No results found.   ASSESSMENT:  1. Stage II IgA kappa multiple myeloma, standard risk: -XRT to the spine completed on November 10, 2017 following T9-T10 laminectomy with resection of epidural tumor. -5 cycles of RVD from 11/14/2017 through 02/19/2018 followed by stem cell transplant on April 03, 2018. -Revlimid maintenance therapy 10 mg 3 weeks on/1 week off started on August 24, 2018. -PET scan on 09/02/2019 showed redemonstrated bilateral cervical, right supraclavicular, right axillary hypermetabolic adenopathy with interval resolution of left axillary adenopathy.  Similar lytic and sclerotic lesions in bones with no corresponding FDG uptake.  Persistent increased FDG uptake in both palatine tonsils. -Reviewed myeloma panel from 09/26/2019.  SPEP and immunofixation is negative.  Free light chain ratio is normal with kappa light chains elevated at 32.5. -Right neck lymph node biopsy on 10/21/2019 at [facility] was negative for malignancy.  2. Myeloma bone disease: -Zometa was started on 11/21/2017, resumed after transplant on 09/06/2018.   PLAN:  1. Stage II IgA kappa multiple myeloma, standard risk: -We reviewed results of lymph node biopsy at [facility]. -I reviewed myeloma panel from 09/26/2019.  M spike was negative.  Immunofixation was normal.  Free kappa light chain was elevated at 32.5 with normal ratio.  CBC and LFTs were normal.  Creatinine was 0.86. -She will continue Revlimid  maintenance 3 weeks on 1 week off at this time. -I plan to see her back in 3 months for follow-up.  2. Myeloma bone disease: -Continue Zometa every 3 months.  Calcium is  normal.  3.  Thromboprophylaxis: -Continue aspirin 81 mg daily.  4.  Hypertension: -Continue amlodipine and hydrochlorothiazide.  Orders placed this encounter:  No orders of the defined types were placed in this encounter.    Derek Jack, MD [place] [phone]   I, Milinda Antis, am acting as a scribe for Dr. Sanda Linger.  I, Derek Jack MD, have reviewed the above documentation for accuracy and completeness, and I agree with the above.

## 2019-11-14 NOTE — Progress Notes (Signed)
Patient is here today for her zometa infusion.  Patient was assessed by Dr. Delton Coombes and labs have been reviewed.  Patient is okay to proceed with treatment today. Primary RN and pharmacy aware.

## 2019-11-14 NOTE — Patient Instructions (Signed)
[place] at [facility] Discharge Instructions  You were seen today by Dr. Delton Coombes. He went over your recent results. You received your injection today. Dr. Delton Coombes will see you back in 3 months for labs and follow up.   Thank you for choosing [place] at [facility] to provide your oncology and hematology care.  To afford each patient quality time with our provider, please arrive at least 15 minutes before your scheduled appointment time.   If you have a lab appointment with the [facility] please come in thru the Main Entrance and check in at the main information desk  You need to re-schedule your appointment should you arrive 10 or more minutes late.  We strive to give you quality time with our providers, and arriving late affects you and other patients whose appointments are after yours.  Also, if you no show three or more times for appointments you may be dismissed from the clinic at the providers discretion.     Again, thank you for choosing [facility].  Our hope is that these requests will decrease the amount of time that you wait before being seen by our physicians.       _____________________________________________________________  Should you have questions after your visit to [facility], please contact our office at [phone] between the hours of 8:00 a.m. and 4:30 p.m.  Voicemails left after 4:00 p.m. will not be returned until the following business day.  For prescription refill requests, have your pharmacy contact our office and allow 72 hours.    Cancer Center Support Programs:   > Cancer Support Group  2nd Tuesday of the month 1pm-2pm, Journey Room

## 2019-11-15 LAB — PROTEIN ELECTROPHORESIS, SERUM
A/G Ratio: 0.9 (ref 0.7–1.7)
Albumin ELP: 3.5 g/dL (ref 2.9–4.4)
Alpha-1-Globulin: 0.2 g/dL (ref 0.0–0.4)
Alpha-2-Globulin: 0.9 g/dL (ref 0.4–1.0)
Beta Globulin: 1.3 g/dL (ref 0.7–1.3)
Gamma Globulin: 1.3 g/dL (ref 0.4–1.8)
Globulin, Total: 3.8 g/dL (ref 2.2–3.9)
Total Protein ELP: 7.3 g/dL (ref 6.0–8.5)

## 2019-11-15 LAB — KAPPA/LAMBDA LIGHT CHAINS
Kappa free light chain: 32.4 mg/L — ABNORMAL HIGH (ref 3.3–19.4)
Kappa, lambda light chain ratio: 1.62 (ref 0.26–1.65)
Lambda free light chains: 20 mg/L (ref 5.7–26.3)

## 2019-11-18 LAB — IMMUNOFIXATION ELECTROPHORESIS
IgA: 696 mg/dL — ABNORMAL HIGH (ref 64–422)
IgG (Immunoglobin G), Serum: 1068 mg/dL (ref 586–1602)
IgM (Immunoglobulin M), Srm: 19 mg/dL — ABNORMAL LOW (ref 26–217)
Total Protein ELP: 7.1 g/dL (ref 6.0–8.5)

## 2019-11-19 ENCOUNTER — Other Ambulatory Visit (HOSPITAL_COMMUNITY): Payer: Self-pay | Admitting: *Deleted

## 2019-11-19 DIAGNOSIS — C9001 Multiple myeloma in remission: Secondary | ICD-10-CM

## 2019-11-19 MED ORDER — LENALIDOMIDE 10 MG PO CAPS: ORAL_CAPSULE | ORAL | 0 refills | Status: DC

## 2019-11-19 NOTE — Telephone Encounter (Signed)
Per last office note, revlimid refill sent to pharmacy.

## 2019-12-08 ENCOUNTER — Other Ambulatory Visit (HOSPITAL_COMMUNITY): Payer: Self-pay | Admitting: Hematology

## 2019-12-08 DIAGNOSIS — C9001 Multiple myeloma in remission: Secondary | ICD-10-CM

## 2019-12-13 NOTE — Telephone Encounter (Signed)
Chart reviewed. Patient's Revlimid refilled per Dr. Katragadda. 

## 2019-12-27 ENCOUNTER — Other Ambulatory Visit (HOSPITAL_COMMUNITY): Payer: Self-pay

## 2019-12-27 DIAGNOSIS — E876 Hypokalemia: Secondary | ICD-10-CM

## 2019-12-27 MED ORDER — POTASSIUM CHLORIDE CRYS ER 20 MEQ PO TBCR: 20.0000 meq | EXTENDED_RELEASE_TABLET | Freq: Two times a day (BID) | ORAL | 2 refills | Status: DC

## 2020-01-02 ENCOUNTER — Other Ambulatory Visit (HOSPITAL_COMMUNITY): Payer: Self-pay | Admitting: Hematology

## 2020-01-02 DIAGNOSIS — C9001 Multiple myeloma in remission: Secondary | ICD-10-CM

## 2020-01-06 ENCOUNTER — Other Ambulatory Visit (HOSPITAL_COMMUNITY): Payer: Self-pay

## 2020-01-06 NOTE — Telephone Encounter (Signed)
Chart reviewed. Revlimid refilled per Dr. Delton Coombes.

## 2020-01-07 DIAGNOSIS — Z124 Encounter for screening for malignant neoplasm of cervix: Secondary | ICD-10-CM | POA: Diagnosis not present

## 2020-01-07 DIAGNOSIS — Z6829 Body mass index (BMI) 29.0-29.9, adult: Secondary | ICD-10-CM | POA: Diagnosis not present

## 2020-01-08 DIAGNOSIS — Z124 Encounter for screening for malignant neoplasm of cervix: Secondary | ICD-10-CM | POA: Diagnosis not present

## 2020-01-26 ENCOUNTER — Other Ambulatory Visit (HOSPITAL_COMMUNITY): Payer: Self-pay | Admitting: Hematology

## 2020-01-26 DIAGNOSIS — C9001 Multiple myeloma in remission: Secondary | ICD-10-CM

## 2020-01-30 NOTE — Telephone Encounter (Signed)
Chart reviewed. Revlimid refilled per Dr. Katragadda 

## 2020-02-06 ENCOUNTER — Inpatient Hospital Stay (HOSPITAL_COMMUNITY): Payer: Medicare HMO | Attending: Hematology

## 2020-02-06 ENCOUNTER — Inpatient Hospital Stay (HOSPITAL_COMMUNITY): Payer: Medicare HMO

## 2020-02-06 ENCOUNTER — Inpatient Hospital Stay (HOSPITAL_BASED_OUTPATIENT_CLINIC_OR_DEPARTMENT_OTHER): Payer: Medicare HMO | Admitting: Hematology

## 2020-02-06 ENCOUNTER — Other Ambulatory Visit: Payer: Self-pay

## 2020-02-06 VITALS — BP 132/63 | HR 70 | Temp 97.0°F | Resp 18 | Wt 171.0 lb

## 2020-02-06 VITALS — BP 111/71 | HR 67 | Temp 97.0°F | Resp 18

## 2020-02-06 DIAGNOSIS — C9000 Multiple myeloma not having achieved remission: Secondary | ICD-10-CM | POA: Insufficient documentation

## 2020-02-06 DIAGNOSIS — C9001 Multiple myeloma in remission: Secondary | ICD-10-CM

## 2020-02-06 LAB — CBC WITH DIFFERENTIAL/PLATELET
Abs Immature Granulocytes: 0.01 10*3/uL (ref 0.00–0.07)
Basophils Absolute: 0 10*3/uL (ref 0.0–0.1)
Basophils Relative: 1 %
Eosinophils Absolute: 0.1 10*3/uL (ref 0.0–0.5)
Eosinophils Relative: 3 %
HCT: 36 % (ref 36.0–46.0)
Hemoglobin: 12 g/dL (ref 12.0–15.0)
Immature Granulocytes: 0 %
Lymphocytes Relative: 36 %
Lymphs Abs: 1.6 10*3/uL (ref 0.7–4.0)
MCH: 35 pg — ABNORMAL HIGH (ref 26.0–34.0)
MCHC: 33.3 g/dL (ref 30.0–36.0)
MCV: 105 fL — ABNORMAL HIGH (ref 80.0–100.0)
Monocytes Absolute: 0.7 10*3/uL (ref 0.1–1.0)
Monocytes Relative: 16 %
Neutro Abs: 1.9 10*3/uL (ref 1.7–7.7)
Neutrophils Relative %: 44 %
Platelets: 201 10*3/uL (ref 150–400)
RBC: 3.43 MIL/uL — ABNORMAL LOW (ref 3.87–5.11)
RDW: 13.4 % (ref 11.5–15.5)
WBC: 4.3 10*3/uL (ref 4.0–10.5)
nRBC: 0 % (ref 0.0–0.2)

## 2020-02-06 LAB — COMPREHENSIVE METABOLIC PANEL
ALT: 17 U/L (ref 0–44)
AST: 15 U/L (ref 15–41)
Albumin: 3.7 g/dL (ref 3.5–5.0)
Alkaline Phosphatase: 54 U/L (ref 38–126)
Anion gap: 8 (ref 5–15)
BUN: 19 mg/dL (ref 8–23)
CO2: 24 mmol/L (ref 22–32)
Calcium: 9.5 mg/dL (ref 8.9–10.3)
Chloride: 103 mmol/L (ref 98–111)
Creatinine, Ser: 0.93 mg/dL (ref 0.44–1.00)
GFR, Estimated: >60 mL/min (ref >60)
Glucose, Bld: 96 mg/dL (ref 70–99)
Potassium: 4 mmol/L (ref 3.5–5.1)
Sodium: 135 mmol/L (ref 135–145)
Total Bilirubin: 0.7 mg/dL (ref 0.3–1.2)
Total Protein: 7.3 g/dL (ref 6.5–8.1)

## 2020-02-06 LAB — LACTATE DEHYDROGENASE: LDH: 105 U/L (ref 98–192)

## 2020-02-06 MED ORDER — ZOLEDRONIC ACID 4 MG/100ML IV SOLN
4.0000 mg | Freq: Once | INTRAVENOUS | Status: AC
  Administered 2020-02-06: 4 mg via INTRAVENOUS

## 2020-02-06 MED ORDER — ZOLEDRONIC ACID 4 MG/100ML IV SOLN
INTRAVENOUS | Status: AC
  Filled 2020-02-06: qty 100

## 2020-02-06 MED ORDER — SODIUM CHLORIDE 0.9 % IV SOLN: Freq: Once | INTRAVENOUS | Status: AC

## 2020-02-06 MED ORDER — ZOLEDRONIC ACID 4 MG/5ML IV CONC: 4.0000 mg | Freq: Once | INTRAVENOUS | Status: DC

## 2020-02-06 NOTE — Progress Notes (Signed)
Patient presents today for treatment.  Vital signs within parameters.  No new complaints.

## 2020-02-06 NOTE — Progress Notes (Signed)
Zometa given today per MD orders. Tolerated infusion without adverse affects. Vital signs stable. No complaints at this time. Discharged from clinic ambulatory in stable condition. Alert and oriented x 3. F/U with [facility] as scheduled.   

## 2020-02-06 NOTE — Patient Instructions (Signed)
[place] at [facility] Discharge Instructions  You were seen today by Dr. Delton Coombes. He went over your recent results. You received your Zometa injection today. Dr. Delton Coombes will see you back in 3 months for labs and follow up.   Thank you for choosing [place] at [facility] to provide your oncology and hematology care.  To afford each patient quality time with our provider, please arrive at least 15 minutes before your scheduled appointment time.   If you have a lab appointment with the [place] please come in thru the Main Entrance and check in at the main information desk  You need to re-schedule your appointment should you arrive 10 or more minutes late.  We strive to give you quality time with our providers, and arriving late affects you and other patients whose appointments are after yours.  Also, if you no show three or more times for appointments you may be dismissed from the clinic at the providers discretion.     Again, thank you for choosing [facility].  Our hope is that these requests will decrease the amount of time that you wait before being seen by our physicians.       _____________________________________________________________  Should you have questions after your visit to [facility], please contact our office at [phone] between the hours of 8:00 a.m. and 4:30 p.m.  Voicemails left after 4:00 p.m. will not be returned until the following business day.  For prescription refill requests, have your pharmacy contact our office and allow 72 hours.    Cancer Center Support Programs:   > Cancer Support Group  2nd Tuesday of the month 1pm-2pm, Journey Room

## 2020-02-06 NOTE — Progress Notes (Signed)
Jamie Ewing, [place] [address]   CLINIC:  Medical Oncology/Hematology  PCP:  Asencion Noble, MD [address] / [place] [address]  [phone]  REASON FOR VISIT:  Follow-up for multiple myeloma  PRIOR THERAPY:  1. Radiation through 11/10/2017. 2. RVD x 5 cycles from 11/14/2017 to 02/19/2018. 3. Stem cell transplant on 04/03/2018.  CURRENT THERAPY: Maintenance Revlimid 3 weeks on, 1 week off  INTERVAL HISTORY:  [name], a 79 y.o. female, returns for routine follow-up for her multiple myeloma. Kamille was last seen on 11/14/2019.  Today she reports feeling okay. She is tolerating the Revlimid well and denies having any issues; this is her off week and she takes it 3 weeks on and 1 week off. She denies having N/V/D/C, numbness, leg swelling or any new pains.  She will go to [facility] in January and will get her Shingrix then as well. She already got her flu shot and COVID booster.   REVIEW OF SYSTEMS:  Review of Systems  Constitutional: Positive for fatigue (75%). Negative for appetite change.  Cardiovascular: Negative for leg swelling.  Gastrointestinal: Negative for constipation, diarrhea, nausea and vomiting.  Musculoskeletal: Negative for arthralgias.  Neurological: Negative for numbness.  All other systems reviewed and are negative.   PAST MEDICAL/SURGICAL HISTORY:  Past Medical History:  Diagnosis Date   Back pain    Hypercholesteremia    Hypertension    Past Surgical History:  Procedure Laterality Date   BONE MARROW BIOPSY     LAMINECTOMY N/A 10/06/2017   Procedure: THORACIC NINE AND TEN LAMINECTOMY WITH RESECTION OF TUMOR, THORACIC EIGHT TO THORACIC ELEVEN FUSION WITH PEDICLE SCREW FIXATION;  Surgeon: Earnie Larsson, MD;  Location: [facility];  Service: Neurosurgery;  Laterality: N/A;   TOTAL KNEE ARTHROPLASTY  2001    SOCIAL HISTORY:  Social History   Socioeconomic History   Marital status: Widowed     Spouse name: Not on file   Number of children: 2   Years of education: Not on file   Highest education level: Not on file  Occupational History   Not on file  Tobacco Use   Smoking status: Former Smoker    Packs/day: 0.50    Years: 30.00    Pack years: 15.00    Types: Cigarettes    Quit date: 05/31/2006    Years since quitting: 13.6   Smokeless tobacco: Never Used  Vaping Use   Vaping Use: Never used  Substance and Sexual Activity   Alcohol use: Yes    Comment: wine occassionally   Drug use: No   Sexual activity: Not on file  Other Topics Concern   Not on file  Social History Narrative   Not on file   Social Determinants of Health   Financial Resource Strain:    Difficulty of Paying Living Expenses: Not on file  Food Insecurity:    Worried About Charity fundraiser in the Last Year: Not on file   [employer] of Food in the Last Year: Not on file  Transportation Needs:    Lack of Transportation (Medical): Not on file   Lack of Transportation (Non-Medical): Not on file  Physical Activity:    Days of Exercise per Week: Not on file   Minutes of Exercise per Session: Not on file  Stress:    Feeling of Stress : Not on file  Social Connections:    Frequency of Communication with Friends and Family: Not  on file   Frequency of Social Gatherings with Friends and Family: Not on file   Attends Religious Services: Not on file   Active Member of Clubs or Organizations: Not on file   Attends Archivist Meetings: Not on file   Marital Status: Not on file  Intimate Partner Violence:    Fear of Current or Ex-Partner: Not on file   Emotionally Abused: Not on file   Physically Abused: Not on file   Sexually Abused: Not on file    FAMILY HISTORY:  Family History  Problem Relation Age of Onset   Tuberculosis Mother    Kidney failure Father    Hypertension Paternal Aunt    Diabetes Paternal Aunt    Hypertension Paternal Uncle     Diabetes Paternal Uncle    Hypertension Daughter    Stroke Daughter     CURRENT MEDICATIONS:  Current Outpatient Medications  Medication Sig Dispense Refill   acetaminophen (TYLENOL) 500 MG tablet Take by mouth.      amLODipine (NORVASC) 5 MG tablet Take 5 mg by mouth daily.      aspirin 81 MG chewable tablet Chew 81 mg by mouth daily.     calcium carbonate (CALCIUM 600) 1500 (600 Ca) MG TABS tablet Take by mouth.     Cyanocobalamin (VITAMIN B-12 PO) Take by mouth.     hydrochlorothiazide (HYDRODIURIL) 25 MG tablet Take 25 mg by mouth daily.     lenalidomide (REVLIMID) 10 MG capsule TAKE 1 CAPSULE BY MOUTH EVERY DAY FOR 21 DAYS THEN OFF FOR 7 DAYS 21 capsule 0   Multiple Vitamins-Minerals (THERA-M) TABS Thera-M 9 mg iron-400 mcg tablet     polyethylene glycol (MIRALAX / GLYCOLAX) packet Take 17 g by mouth as needed.      potassium chloride SA (KLOR-CON) 20 MEQ tablet Take 1 tablet (20 mEq total) by mouth 2 (two) times daily. 180 tablet 2   temazepam (RESTORIL) 15 MG capsule TAKE 1 CAPSULE BY MOUTH AT BEDTIME AS NEEDED FOR SLEEP 30 capsule 0   No current facility-administered medications for this visit.    ALLERGIES:  No Known Allergies  PHYSICAL EXAM:  Performance status (ECOG): 1 - Symptomatic but completely ambulatory  Vitals:   02/06/20 1257  BP: 132/63  Pulse: 70  Resp: 18  Temp: (!) 97 F (36.1 C)  SpO2: 100%   Wt Readings from Last 3 Encounters:  02/06/20 171 lb (77.6 kg)  11/14/19 169 lb 3.2 oz (76.7 kg)  10/03/19 171 lb (77.6 kg)   Physical Exam Vitals reviewed.  Constitutional:      Appearance: Normal appearance.  Cardiovascular:     Rate and Rhythm: Normal rate and regular rhythm.     Pulses: Normal pulses.     Heart sounds: Normal heart sounds.  Pulmonary:     Effort: Pulmonary effort is normal.     Breath sounds: Normal breath sounds.  Musculoskeletal:     Right lower leg: No edema.     Left lower leg: No edema.  Neurological:      General: No focal deficit present.     Mental Status: She is alert and oriented to person, place, and time.  Psychiatric:        Mood and Affect: Mood normal.        Behavior: Behavior normal.     LABORATORY DATA:  I have reviewed the labs as listed.  CBC Latest Ref Rng & Units 02/06/2020 11/14/2019 09/26/2019  WBC 4.0 - 10.5  K/uL 4.3 5.0 3.9(L)  Hemoglobin 12.0 - 15.0 g/dL 12.0 12.3 12.8  Hematocrit 36 - 46 % 36.0 38.0 38.9  Platelets 150 - 400 K/uL 201 226 225   CMP Latest Ref Rng & Units 02/06/2020 11/14/2019 09/26/2019  Glucose 70 - 99 mg/dL 96 154(H) 123(H)  BUN 8 - 23 mg/dL _0 Creatinine 0.44 - 1.00 mg/dL 0.93 0.86 0.85  Sodium 135 - 145 mmol/L 135 135 136  Potassium 3.5 - 5.1 mmol/L 4.0 4.0 3.8  Chloride 98 - 111 mmol/L 103 101 101  CO2 22 - 32 mmol/L _1 Calcium 8.9 - 10.3 mg/dL 9.5 9.2 9.5  Total Protein 6.5 - 8.1 g/dL 7.3 7.7 7.9  Total Bilirubin 0.3 - 1.2 mg/dL 0.7 0.4 0.8  Alkaline Phos 38 - 126 U/L 54 51 51  AST 15 - 41 U/L _2 ALT 0 - 44 U/L _3 Component Value Date/Time   RBC 3.43 (L) 02/06/2020 1213   MCV 105.0 (H) 02/06/2020 1213   MCH 35.0 (H) 02/06/2020 1213   MCHC 33.3 02/06/2020 1213   RDW 13.4 02/06/2020 1213   LYMPHSABS 1.6 02/06/2020 1213   MONOABS 0.7 02/06/2020 1213   EOSABS 0.1 02/06/2020 1213   BASOSABS 0.0 02/06/2020 1213   Lab Results  Component Value Date   LDH 105 02/06/2020   LDH 111 11/14/2019   LDH 106 09/26/2019   Lab Results  Component Value Date   TOTALPROTELP 7.3 11/14/2019   TOTALPROTELP 7.1 11/14/2019   ALBUMINELP 3.5 11/14/2019   A1GS 0.2 11/14/2019   A2GS 0.9 11/14/2019   BETS 1.3 11/14/2019   GAMS 1.3 11/14/2019   MSPIKE Not Observed 11/14/2019   SPEI Comment 11/14/2019    Lab Results  Component Value Date   KPAFRELGTCHN 32.4 (H) 11/14/2019   LAMBDASER 20.0 11/14/2019   KAPLAMBRATIO 1.62 11/14/2019    DIAGNOSTIC IMAGING:  I have independently reviewed the scans and discussed with  the patient. No results found.   ASSESSMENT:  1. Stage II IgA kappa multiple myeloma, standard risk: -XRT to the spine completed on November 10, 2017 following T9-T10 laminectomy with resection of epidural tumor. -5 cycles of RVD from 11/14/2017 through 02/19/2018 followed by stem cell transplant on April 03, 2018. -Revlimid maintenance therapy 10 mg 3 weeks on/1 week off started on August 24, 2018. -PET scan on 09/02/2019 showed redemonstrated bilateral cervical, right supraclavicular, right axillary hypermetabolic adenopathy with interval resolution of left axillary adenopathy. Similar lytic and sclerotic lesions in bones with no corresponding FDG uptake. Persistent increased FDG uptake in both palatine tonsils. -Reviewed myeloma panel from 09/26/2019. SPEP and immunofixation is negative. Free light chain ratio is normal with kappa light chains elevated at 32.5. -Right neck lymph node biopsy on 10/21/2019 at [facility] was negative for malignancy.  2. Myeloma bone disease: -Zometa was started on 11/21/2017, resumed after transplant on 09/06/2018.   PLAN:  1. Stage II IgA kappa multiple myeloma, standard risk: -She is tolerating Revlimid maintenance 3 weeks on 1 week off very well. -Reviewed labs from 02/06/2020.  Kappa light chains are 31, lambda light chains 19.5 with ratio of 1.59. -CBC was near normal.  LDH is 105 and LFTs are normal.  Creatinine and calcium are normal.  SPEP and immunofixation are pending. -Continue Revlimid at the same dose.  RTC 3 months with repeat myeloma labs. -She has an appointment at [facility] in January 2022.  2. Myeloma bone disease: -Continue Zometa today and every 3 months. -Continue calcium supplements.  3. Thromboprophylaxis: -Continue aspirin 81 mg daily.  4. Hypertension: -Continue amlodipine and HCTZ.  Orders placed this encounter:  No orders of the defined types were placed in this encounter.    Derek Jack, MD [place] [phone]   I, Milinda Antis, am acting as a scribe for Dr. Sanda Linger.  I, Derek Jack MD, have reviewed the above documentation for accuracy and completeness, and I agree with the above.

## 2020-02-06 NOTE — Patient Instructions (Signed)
[facility] at [facility]  Discharge Instructions:   _______________________________________________________________  Thank you for choosing [facility] at [facility] to provide your oncology and hematology care.  To afford each patient quality time with our providers, please arrive at least 15 minutes before your scheduled appointment.  You need to re-schedule your appointment if you arrive 10 or more minutes late.  We strive to give you quality time with our providers, and arriving late affects you and other patients whose appointments are after yours.  Also, if you no show three or more times for appointments you may be dismissed from the clinic.  Again, thank you for choosing [facility] at [facility]. Our hope is that these requests will allow you access to exceptional care and in a timely manner. _______________________________________________________________  If you have questions after your visit, please contact our office at [phone] between the hours of 8:30 a.m. and 5:00 p.m. Voicemails left after 4:30 p.m. will not be returned until the following business day. _______________________________________________________________  For prescription refill requests, have your pharmacy contact our office. _______________________________________________________________  Recommendations made by the consultant and any test results will be sent to your referring physician. _______________________________________________________________ 

## 2020-02-07 LAB — PROTEIN ELECTROPHORESIS, SERUM
A/G Ratio: 1 (ref 0.7–1.7)
Albumin ELP: 3.6 g/dL (ref 2.9–4.4)
Alpha-1-Globulin: 0.2 g/dL (ref 0.0–0.4)
Alpha-2-Globulin: 0.8 g/dL (ref 0.4–1.0)
Beta Globulin: 1.3 g/dL (ref 0.7–1.3)
Gamma Globulin: 1.3 g/dL (ref 0.4–1.8)
Globulin, Total: 3.7 g/dL (ref 2.2–3.9)
Total Protein ELP: 7.3 g/dL (ref 6.0–8.5)

## 2020-02-07 LAB — KAPPA/LAMBDA LIGHT CHAINS
Kappa free light chain: 31.1 mg/L — ABNORMAL HIGH (ref 3.3–19.4)
Kappa, lambda light chain ratio: 1.59 (ref 0.26–1.65)
Lambda free light chains: 19.5 mg/L (ref 5.7–26.3)

## 2020-02-10 LAB — IMMUNOFIXATION ELECTROPHORESIS
IgA: 697 mg/dL — ABNORMAL HIGH (ref 64–422)
IgG (Immunoglobin G), Serum: 1137 mg/dL (ref 586–1602)
IgM (Immunoglobulin M), Srm: 15 mg/dL — ABNORMAL LOW (ref 26–217)
Total Protein ELP: 7 g/dL (ref 6.0–8.5)

## 2020-02-20 ENCOUNTER — Other Ambulatory Visit (HOSPITAL_COMMUNITY): Payer: Self-pay | Admitting: Hematology

## 2020-02-20 DIAGNOSIS — C9001 Multiple myeloma in remission: Secondary | ICD-10-CM

## 2020-02-25 ENCOUNTER — Other Ambulatory Visit (HOSPITAL_COMMUNITY): Payer: Self-pay

## 2020-02-25 DIAGNOSIS — C9001 Multiple myeloma in remission: Secondary | ICD-10-CM

## 2020-02-25 MED ORDER — LENALIDOMIDE 10 MG PO CAPS: ORAL_CAPSULE | ORAL | 0 refills | Status: DC

## 2020-02-25 NOTE — Telephone Encounter (Signed)
Chart reviewed. Revlimid refilled per Dr. Delton Coombes

## 2020-03-15 ENCOUNTER — Other Ambulatory Visit (HOSPITAL_COMMUNITY): Payer: Self-pay | Admitting: Hematology

## 2020-03-15 DIAGNOSIS — C9001 Multiple myeloma in remission: Secondary | ICD-10-CM

## 2020-03-19 ENCOUNTER — Other Ambulatory Visit (HOSPITAL_COMMUNITY): Payer: Self-pay

## 2020-03-19 DIAGNOSIS — C9001 Multiple myeloma in remission: Secondary | ICD-10-CM

## 2020-03-19 MED ORDER — LENALIDOMIDE 10 MG PO CAPS: ORAL_CAPSULE | ORAL | 0 refills | Status: DC

## 2020-03-19 NOTE — Telephone Encounter (Signed)
Chart reviewed. Revlimid refilled per Dr. Delton Coombes

## 2020-04-02 DIAGNOSIS — Z9484 Stem cells transplant status: Secondary | ICD-10-CM | POA: Diagnosis not present

## 2020-04-02 DIAGNOSIS — Z923 Personal history of irradiation: Secondary | ICD-10-CM | POA: Diagnosis not present

## 2020-04-02 DIAGNOSIS — Z79899 Other long term (current) drug therapy: Secondary | ICD-10-CM | POA: Diagnosis not present

## 2020-04-02 DIAGNOSIS — G629 Polyneuropathy, unspecified: Secondary | ICD-10-CM | POA: Diagnosis not present

## 2020-04-02 DIAGNOSIS — Z23 Encounter for immunization: Secondary | ICD-10-CM | POA: Diagnosis not present

## 2020-04-02 DIAGNOSIS — Z9221 Personal history of antineoplastic chemotherapy: Secondary | ICD-10-CM | POA: Diagnosis not present

## 2020-04-02 DIAGNOSIS — E538 Deficiency of other specified B group vitamins: Secondary | ICD-10-CM | POA: Diagnosis not present

## 2020-04-02 DIAGNOSIS — C9001 Multiple myeloma in remission: Secondary | ICD-10-CM | POA: Diagnosis not present

## 2020-04-10 ENCOUNTER — Other Ambulatory Visit (HOSPITAL_COMMUNITY): Payer: Self-pay | Admitting: Hematology

## 2020-04-10 DIAGNOSIS — C9001 Multiple myeloma in remission: Secondary | ICD-10-CM

## 2020-04-14 NOTE — Telephone Encounter (Signed)
Chart reviewed. Revlimid refilled per Dr. Delton Coombes.

## 2020-04-20 DIAGNOSIS — H5200 Hypermetropia, unspecified eye: Secondary | ICD-10-CM | POA: Diagnosis not present

## 2020-04-20 DIAGNOSIS — Z0100 Encounter for examination of eyes and vision without abnormal findings: Secondary | ICD-10-CM | POA: Diagnosis not present

## 2020-04-30 ENCOUNTER — Inpatient Hospital Stay (HOSPITAL_COMMUNITY): Payer: Medicare HMO | Attending: Hematology

## 2020-04-30 ENCOUNTER — Inpatient Hospital Stay (HOSPITAL_BASED_OUTPATIENT_CLINIC_OR_DEPARTMENT_OTHER): Payer: Medicare HMO | Admitting: Hematology

## 2020-04-30 ENCOUNTER — Inpatient Hospital Stay (HOSPITAL_COMMUNITY): Payer: Medicare HMO

## 2020-04-30 ENCOUNTER — Other Ambulatory Visit: Payer: Self-pay

## 2020-04-30 VITALS — BP 153/68 | HR 79 | Temp 96.8°F | Resp 18 | Wt 169.8 lb

## 2020-04-30 VITALS — BP 123/55 | HR 68 | Temp 96.8°F | Resp 18

## 2020-04-30 DIAGNOSIS — C9000 Multiple myeloma not having achieved remission: Secondary | ICD-10-CM

## 2020-04-30 LAB — CBC WITH DIFFERENTIAL/PLATELET
Abs Immature Granulocytes: 0.02 10*3/uL (ref 0.00–0.07)
Basophils Absolute: 0 10*3/uL (ref 0.0–0.1)
Basophils Relative: 1 %
Eosinophils Absolute: 0.1 10*3/uL (ref 0.0–0.5)
Eosinophils Relative: 2 %
HCT: 38.4 % (ref 36.0–46.0)
Hemoglobin: 12.6 g/dL (ref 12.0–15.0)
Immature Granulocytes: 0 %
Lymphocytes Relative: 36 %
Lymphs Abs: 1.7 10*3/uL (ref 0.7–4.0)
MCH: 33.9 pg (ref 26.0–34.0)
MCHC: 32.8 g/dL (ref 30.0–36.0)
MCV: 103.2 fL — ABNORMAL HIGH (ref 80.0–100.0)
Monocytes Absolute: 0.7 10*3/uL (ref 0.1–1.0)
Monocytes Relative: 15 %
Neutro Abs: 2.1 10*3/uL (ref 1.7–7.7)
Neutrophils Relative %: 46 %
Platelets: 217 10*3/uL (ref 150–400)
RBC: 3.72 MIL/uL — ABNORMAL LOW (ref 3.87–5.11)
RDW: 13.3 % (ref 11.5–15.5)
WBC: 4.5 10*3/uL (ref 4.0–10.5)
nRBC: 0 % (ref 0.0–0.2)

## 2020-04-30 LAB — COMPREHENSIVE METABOLIC PANEL
ALT: 16 U/L (ref 0–44)
AST: 16 U/L (ref 15–41)
Albumin: 3.8 g/dL (ref 3.5–5.0)
Alkaline Phosphatase: 46 U/L (ref 38–126)
Anion gap: 7 (ref 5–15)
BUN: 19 mg/dL (ref 8–23)
CO2: 23 mmol/L (ref 22–32)
Calcium: 10.1 mg/dL (ref 8.9–10.3)
Chloride: 104 mmol/L (ref 98–111)
Creatinine, Ser: 1 mg/dL (ref 0.44–1.00)
GFR, Estimated: 58 mL/min — ABNORMAL LOW (ref >60)
Glucose, Bld: 106 mg/dL — ABNORMAL HIGH (ref 70–99)
Potassium: 3.6 mmol/L (ref 3.5–5.1)
Sodium: 134 mmol/L — ABNORMAL LOW (ref 135–145)
Total Bilirubin: 0.8 mg/dL (ref 0.3–1.2)
Total Protein: 7.9 g/dL (ref 6.5–8.1)

## 2020-04-30 LAB — LACTATE DEHYDROGENASE: LDH: 108 U/L (ref 98–192)

## 2020-04-30 MED ORDER — SODIUM CHLORIDE 0.9 % IV SOLN: Freq: Once | INTRAVENOUS | Status: AC

## 2020-04-30 MED ORDER — ZOLEDRONIC ACID 4 MG/5ML IV CONC: 4.0000 mg | Freq: Once | INTRAVENOUS | Status: DC

## 2020-04-30 MED ORDER — ZOLEDRONIC ACID 4 MG/100ML IV SOLN
INTRAVENOUS | Status: AC
  Filled 2020-04-30: qty 100

## 2020-04-30 MED ORDER — ZOLEDRONIC ACID 4 MG/100ML IV SOLN
4.0000 mg | Freq: Once | INTRAVENOUS | Status: AC
  Administered 2020-04-30: 4 mg via INTRAVENOUS

## 2020-04-30 NOTE — Progress Notes (Signed)
Jamie Ewing, [place] [address]   CLINIC:  Medical Oncology/Hematology  PCP:  Asencion Noble, MD [address] / [place] [address]  [phone]  REASON FOR VISIT:  Follow-up for multiple myeloma  PRIOR THERAPY:  1. Radiation through 11/10/2017. 2. RVD x 5 cycles from 11/14/2017 to 02/19/2018. 3. Stem cell transplant on 04/03/2018.  CURRENT THERAPY: Maintenance Revlimid 10 mg 3/4 weeks  INTERVAL HISTORY:  [name], a 80 y.o. female, returns for routine follow-up for her multiple myeloma. Ece was last seen on 02/06/2020.  Today she reports feeling well. She went to [facility] in January and will follow-up in 1 year. She is taking Revlimid 10 mg 3 weeks on with 1 week off, as well as ASA 81 mg, and is tolerating it well. She denies having any new aches or pains, N/V/D, abdominal pain or cramping, leg swelling, recent infections or ED visits.  She received her 3rd COVID booster on 10/2019 and is wondering when she can get her 4th vaccine.  REVIEW OF SYSTEMS:  Review of Systems  Constitutional: Positive for fatigue (90%). Negative for appetite change, chills and fever.  Cardiovascular: Negative for leg swelling.  Gastrointestinal: Negative for abdominal pain, diarrhea, nausea and vomiting.  Musculoskeletal: Negative for arthralgias and myalgias.  All other systems reviewed and are negative.   PAST MEDICAL/SURGICAL HISTORY:  Past Medical History:  Diagnosis Date  . Back pain   . Hypercholesteremia   . Hypertension    Past Surgical History:  Procedure Laterality Date  . BONE MARROW BIOPSY    . LAMINECTOMY N/A 10/06/2017   Procedure: THORACIC NINE AND TEN LAMINECTOMY WITH RESECTION OF TUMOR, THORACIC EIGHT TO THORACIC ELEVEN FUSION WITH PEDICLE SCREW FIXATION;  Surgeon: Earnie Larsson, MD;  Location: [facility];  Service: Neurosurgery;  Laterality: N/A;  . TOTAL KNEE ARTHROPLASTY  2001    SOCIAL HISTORY:  Social History    Socioeconomic History  . Marital status: Widowed    Spouse name: Not on file  . Number of children: 2  . Years of education: Not on file  . Highest education level: Not on file  Occupational History  . Not on file  Tobacco Use  . Smoking status: Former Smoker    Packs/day: 0.50    Years: 30.00    Pack years: 15.00    Types: Cigarettes    Quit date: 05/31/2006    Years since quitting: 13.9  . Smokeless tobacco: Never Used  Vaping Use  . Vaping Use: Never used  Substance and Sexual Activity  . Alcohol use: Yes    Comment: wine occassionally  . Drug use: No  . Sexual activity: Not on file  Other Topics Concern  . Not on file  Social History Narrative  . Not on file   Social Determinants of Health   Financial Resource Strain: Not on file  Food Insecurity: Not on file  Transportation Needs: Not on file  Physical Activity: Not on file  Stress: Not on file  Social Connections: Not on file  Intimate Partner Violence: Not on file    FAMILY HISTORY:  Family History  Problem Relation Age of Onset  . Tuberculosis Mother   . Kidney failure Father   . Hypertension Paternal Aunt   . Diabetes Paternal Aunt   . Hypertension Paternal Uncle   . Diabetes Paternal Uncle   . Hypertension Daughter   . Stroke Daughter     CURRENT MEDICATIONS:  Current Outpatient Medications  Medication Sig Dispense Refill  . acetaminophen (TYLENOL) 500 MG tablet Take by mouth.     [name] amLODipine (NORVASC) 5 MG tablet Take 5 mg by mouth daily.     . calcium carbonate (OSCAL) 1500 (600 Ca) MG TABS tablet Take by mouth.    . hydrochlorothiazide (HYDRODIURIL) 25 MG tablet Take 25 mg by mouth daily.    [name] lenalidomide (REVLIMID) 10 MG capsule TAKE 1 CAPSULE BY MOUTH EVERY DAY FOR 21 DAYS ON THEN 7 DAYS OFF 21 capsule 0  . LOW-DOSE ASPIRIN PO Asprin Ec Low Dose    . Multiple Vitamins-Minerals (THERA-M) TABS Thera-M 9 mg iron-400 mcg tablet    . polyethylene glycol (MIRALAX / GLYCOLAX) packet Take 17  g by mouth as needed.     . potassium chloride SA (KLOR-CON) 20 MEQ tablet Take 1 tablet (20 mEq total) by mouth 2 (two) times daily. 180 tablet 2  . temazepam (RESTORIL) 15 MG capsule TAKE 1 CAPSULE BY MOUTH AT BEDTIME AS NEEDED FOR SLEEP 30 capsule 0  . vitamin B-12 (CYANOCOBALAMIN) 1000 MCG tablet Vitamin B12     No current facility-administered medications for this visit.    ALLERGIES:  No Known Allergies  PHYSICAL EXAM:  Performance status (ECOG): 1 - Symptomatic but completely ambulatory  Vitals:   04/30/20 1246  BP: (!) 153/68  Pulse: 79  Resp: 18  Temp: (!) 96.8 F (36 C)  SpO2: 100%   Wt Readings from Last 3 Encounters:  04/30/20 169 lb 12.8 oz (77 kg)  02/06/20 171 lb (77.6 kg)  11/14/19 169 lb 3.2 oz (76.7 kg)   Physical Exam Vitals reviewed.  Constitutional:      Appearance: Normal appearance.  Cardiovascular:     Rate and Rhythm: Normal rate and regular rhythm.     Pulses: Normal pulses.     Heart sounds: Normal heart sounds.  Pulmonary:     Effort: Pulmonary effort is normal.     Breath sounds: Normal breath sounds.  Abdominal:     Palpations: Abdomen is soft.     Tenderness: There is no abdominal tenderness.  Musculoskeletal:     Right lower leg: No edema.     Left lower leg: No edema.  Neurological:     General: No focal deficit present.     Mental Status: She is alert and oriented to person, place, and time.  Psychiatric:        Mood and Affect: Mood normal.        Behavior: Behavior normal.     LABORATORY DATA:  I have reviewed the labs as listed.  CBC Latest Ref Rng & Units 04/30/2020 02/06/2020 11/14/2019  WBC 4.0 - 10.5 K/uL 4.5 4.3 5.0  Hemoglobin 12.0 - 15.0 g/dL 12.6 12.0 12.3  Hematocrit 36.0 - 46.0 % 38.4 36.0 38.0  Platelets 150 - 400 K/uL 217 201 226   CMP Latest Ref Rng & Units 04/30/2020 02/06/2020 11/14/2019  Glucose 70 - 99 mg/dL 106(H) 96 154(H)  BUN 8 - 23 mg/dL $Remove'19 19 14  'BtvhrGw$ Creatinine 0.44 - 1.00 mg/dL 1.00 0.93 0.86   Sodium 135 - 145 mmol/L 134(L) 135 135  Potassium 3.5 - 5.1 mmol/L 3.6 4.0 4.0  Chloride 98 - 111 mmol/L 104 103 101  CO2 22 - 32 mmol/L $RemoveB'23 24 22  'bgSszagJ$ Calcium 8.9 - 10.3 mg/dL 10.1 9.5 9.2  Total Protein 6.5 - 8.1 g/dL 7.9 7.3 7.7  Total Bilirubin 0.3 - 1.2 mg/dL 0.8 0.7  0.4  Alkaline Phos 38 - 126 U/L 46 54 51  AST 15 - 41 U/L $Remo'16 15 15  'KRxtV$ ALT 0 - 44 U/L $Remo'16 17 16      'WwAhc$ Component Value Date/Time   RBC 3.72 (L) 04/30/2020 1153   MCV 103.2 (H) 04/30/2020 1153   MCH 33.9 04/30/2020 1153   MCHC 32.8 04/30/2020 1153   RDW 13.3 04/30/2020 1153   LYMPHSABS 1.7 04/30/2020 1153   MONOABS 0.7 04/30/2020 1153   EOSABS 0.1 04/30/2020 1153   BASOSABS 0.0 04/30/2020 1153   Lab Results  Component Value Date   LDH 108 04/30/2020   LDH 105 02/06/2020   LDH 111 11/14/2019   Lab Results  Component Value Date   TOTALPROTELP 7.3 02/06/2020   TOTALPROTELP 7.0 02/06/2020   ALBUMINELP 3.6 02/06/2020   A1GS 0.2 02/06/2020   A2GS 0.8 02/06/2020   BETS 1.3 02/06/2020   GAMS 1.3 02/06/2020   MSPIKE Not Observed 02/06/2020   SPEI Comment 02/06/2020    Lab Results  Component Value Date   KPAFRELGTCHN 31.1 (H) 02/06/2020   LAMBDASER 19.5 02/06/2020   KAPLAMBRATIO 1.59 02/06/2020    DIAGNOSTIC IMAGING:  I have independently reviewed the scans and discussed with the patient. No results found.   ASSESSMENT:  1. Stage II IgA kappa multiple myeloma, standard risk: -XRT to the spine completed on November 10, 2017 following T9-T10 laminectomy with resection of epidural tumor. -5 cycles of RVD from 11/14/2017 through 02/19/2018 followed by stem cell transplant on April 03, 2018. -Revlimid maintenance therapy 10 mg 3 weeks on/1 week off started on August 24, 2018. -PET scan on 09/02/2019 showed redemonstrated bilateral cervical, right supraclavicular, right axillary hypermetabolic adenopathy with interval resolution of left axillary adenopathy. Similar lytic and sclerotic lesions in bones with no  corresponding FDG uptake. Persistent increased FDG uptake in both palatine tonsils. -Reviewed myeloma panel from 09/26/2019. SPEP and immunofixation is negative. Free light chain ratio is normal with kappa light chains elevated at 32.5. -Right neck lymph node biopsy on 10/21/2019 at [facility] was negative for malignancy.  2. Myeloma bone disease: -Zometa was started on 11/21/2017, resumed after transplant on 09/06/2018.   PLAN:  1. Stage II IgA kappa multiple myeloma, standard risk: -Continue Revlimid maintenance 3 weeks on 1 week off. -Reviewed labs from [facility] from January 2022.  Myeloma labs are negative. -Reviewed labs from today which showed normal CBC, LDH.  LFTs are also normal.  Creatinine is 1.0. -RTC 3 months with myeloma labs.  2. Myeloma bone disease: -Continue Zometa every 3 months for a total of 2 years. -We will repeat DEXA scan prior to next visit.  Plan to check vitamin D at next visit.  3. Thromboprophylaxis: -Continue aspirin 81 mg daily.  4. Hypertension: -Continue amlodipine and HCTZ.  Orders placed this encounter:  Orders Placed This Encounter  Procedures  . DG Bone Density  . CBC with Differential/Platelet  . Comprehensive metabolic panel  . Lactate dehydrogenase  . Immunofixation electrophoresis  . Protein electrophoresis, serum  . Kappa/lambda light chains  . VITAMIN D 25 Hydroxy (Vit-D Deficiency, Fractures)     Derek Jack, MD [place] [phone]   I, Milinda Antis, am acting as a scribe for Dr. Sanda Linger.  I, Derek Jack MD, have reviewed the above documentation for accuracy and completeness, and I agree with the above.

## 2020-04-30 NOTE — Progress Notes (Signed)
Patient was assessed by Dr. Katragadda and labs have been reviewed.  Patient is okay to proceed with treatment today. Primary RN and pharmacy aware.   

## 2020-04-30 NOTE — Progress Notes (Signed)
Patient presents today for appointment with Dr. Delton Coombes and Zometa 4 mg infusion. Vital signs stable. Patient denies pain today.Patient denies any changes since her last visit. MAR reviewed.   Treatment given today per MD orders. Tolerated infusion without adverse affects. Vital signs stable. No complaints at this time. Discharged from clinic ambulatory in stable condition. Alert and oriented x 3. F/U with [facility] as scheduled.

## 2020-04-30 NOTE — Patient Instructions (Signed)
[facility] at [facility]  Discharge Instructions:   _______________________________________________________________  Thank you for choosing [facility] at [facility] to provide your oncology and hematology care.  To afford each patient quality time with our providers, please arrive at least 15 minutes before your scheduled appointment.  You need to re-schedule your appointment if you arrive 10 or more minutes late.  We strive to give you quality time with our providers, and arriving late affects you and other patients whose appointments are after yours.  Also, if you no show three or more times for appointments you may be dismissed from the clinic.  Again, thank you for choosing [facility] at [facility]. Our hope is that these requests will allow you access to exceptional care and in a timely manner. _______________________________________________________________  If you have questions after your visit, please contact our office at [phone] between the hours of 8:30 a.m. and 5:00 p.m. Voicemails left after 4:30 p.m. will not be returned until the following business day. _______________________________________________________________  For prescription refill requests, have your pharmacy contact our office. _______________________________________________________________  Recommendations made by the consultant and any test results will be sent to your referring physician. _______________________________________________________________ 

## 2020-05-01 LAB — KAPPA/LAMBDA LIGHT CHAINS
Kappa free light chain: 28.6 mg/L — ABNORMAL HIGH (ref 3.3–19.4)
Kappa, lambda light chain ratio: 1.47 (ref 0.26–1.65)
Lambda free light chains: 19.4 mg/L (ref 5.7–26.3)

## 2020-05-02 ENCOUNTER — Other Ambulatory Visit (HOSPITAL_COMMUNITY): Payer: Self-pay | Admitting: Hematology

## 2020-05-02 DIAGNOSIS — C9001 Multiple myeloma in remission: Secondary | ICD-10-CM

## 2020-05-04 LAB — PROTEIN ELECTROPHORESIS, SERUM
A/G Ratio: 1 (ref 0.7–1.7)
Albumin ELP: 3.8 g/dL (ref 2.9–4.4)
Alpha-1-Globulin: 0.3 g/dL (ref 0.0–0.4)
Alpha-2-Globulin: 0.8 g/dL (ref 0.4–1.0)
Beta Globulin: 1.4 g/dL — ABNORMAL HIGH (ref 0.7–1.3)
Gamma Globulin: 1.4 g/dL (ref 0.4–1.8)
Globulin, Total: 3.9 g/dL (ref 2.2–3.9)
Total Protein ELP: 7.7 g/dL (ref 6.0–8.5)

## 2020-05-05 LAB — IMMUNOFIXATION ELECTROPHORESIS
IgA: 931 mg/dL — ABNORMAL HIGH (ref 64–422)
IgG (Immunoglobin G), Serum: 1301 mg/dL (ref 586–1602)
IgM (Immunoglobulin M), Srm: 16 mg/dL — ABNORMAL LOW (ref 26–217)
Total Protein ELP: 7.6 g/dL (ref 6.0–8.5)

## 2020-05-06 NOTE — Telephone Encounter (Signed)
Chart reviewed. Revlimid refilled per Dr. Delton Coombes

## 2020-05-28 ENCOUNTER — Other Ambulatory Visit (HOSPITAL_COMMUNITY): Payer: Self-pay

## 2020-05-28 DIAGNOSIS — C9001 Multiple myeloma in remission: Secondary | ICD-10-CM

## 2020-05-28 MED ORDER — LENALIDOMIDE 10 MG PO CAPS: ORAL_CAPSULE | ORAL | 0 refills | Status: DC

## 2020-05-28 NOTE — Telephone Encounter (Signed)
Chart reviewed, Revlimid refilled per Dr. Delton Coombes.

## 2020-06-18 ENCOUNTER — Other Ambulatory Visit (HOSPITAL_COMMUNITY): Payer: Self-pay | Admitting: Hematology

## 2020-06-18 DIAGNOSIS — C9001 Multiple myeloma in remission: Secondary | ICD-10-CM

## 2020-06-22 NOTE — Telephone Encounter (Signed)
Chart reviewed. Revlimid refilled per Dr. Delton Coombes

## 2020-06-26 DIAGNOSIS — Z23 Encounter for immunization: Secondary | ICD-10-CM | POA: Diagnosis not present

## 2020-07-16 ENCOUNTER — Other Ambulatory Visit (HOSPITAL_COMMUNITY): Payer: Self-pay | Admitting: Hematology

## 2020-07-16 DIAGNOSIS — C9001 Multiple myeloma in remission: Secondary | ICD-10-CM

## 2020-07-17 ENCOUNTER — Other Ambulatory Visit: Payer: Self-pay

## 2020-07-17 ENCOUNTER — Inpatient Hospital Stay (HOSPITAL_COMMUNITY): Payer: Medicare HMO | Attending: Hematology and Oncology

## 2020-07-17 ENCOUNTER — Ambulatory Visit (HOSPITAL_COMMUNITY)
Admission: RE | Admit: 2020-07-17 | Discharge: 2020-07-17 | Disposition: A | Discharge status: Home / Self Care | Payer: Medicare HMO | Source: Ambulatory Visit | Attending: Hematology | Admitting: Hematology

## 2020-07-17 DIAGNOSIS — Z13820 Encounter for screening for osteoporosis: Secondary | ICD-10-CM | POA: Insufficient documentation

## 2020-07-17 DIAGNOSIS — M8580 Other specified disorders of bone density and structure, unspecified site: Secondary | ICD-10-CM | POA: Diagnosis not present

## 2020-07-17 DIAGNOSIS — C9000 Multiple myeloma not having achieved remission: Secondary | ICD-10-CM | POA: Insufficient documentation

## 2020-07-17 DIAGNOSIS — Z780 Asymptomatic menopausal state: Secondary | ICD-10-CM | POA: Insufficient documentation

## 2020-07-17 LAB — CBC WITH DIFFERENTIAL/PLATELET
Abs Immature Granulocytes: 0.02 10*3/uL (ref 0.00–0.07)
Basophils Absolute: 0 10*3/uL (ref 0.0–0.1)
Basophils Relative: 1 %
Eosinophils Absolute: 0.2 10*3/uL (ref 0.0–0.5)
Eosinophils Relative: 5 %
HCT: 38.4 % (ref 36.0–46.0)
Hemoglobin: 12.6 g/dL (ref 12.0–15.0)
Immature Granulocytes: 1 %
Lymphocytes Relative: 43 %
Lymphs Abs: 1.6 10*3/uL (ref 0.7–4.0)
MCH: 33.5 pg (ref 26.0–34.0)
MCHC: 32.8 g/dL (ref 30.0–36.0)
MCV: 102.1 fL — ABNORMAL HIGH (ref 80.0–100.0)
Monocytes Absolute: 0.6 10*3/uL (ref 0.1–1.0)
Monocytes Relative: 17 %
Neutro Abs: 1.2 10*3/uL — ABNORMAL LOW (ref 1.7–7.7)
Neutrophils Relative %: 33 %
Platelets: 199 10*3/uL (ref 150–400)
RBC: 3.76 MIL/uL — ABNORMAL LOW (ref 3.87–5.11)
RDW: 14 % (ref 11.5–15.5)
WBC: 3.7 10*3/uL — ABNORMAL LOW (ref 4.0–10.5)
nRBC: 0 % (ref 0.0–0.2)

## 2020-07-17 LAB — COMPREHENSIVE METABOLIC PANEL
ALT: 21 U/L (ref 0–44)
AST: 21 U/L (ref 15–41)
Albumin: 3.9 g/dL (ref 3.5–5.0)
Alkaline Phosphatase: 55 U/L (ref 38–126)
Anion gap: 11 (ref 5–15)
BUN: 16 mg/dL (ref 8–23)
CO2: 23 mmol/L (ref 22–32)
Calcium: 9.3 mg/dL (ref 8.9–10.3)
Chloride: 101 mmol/L (ref 98–111)
Creatinine, Ser: 1 mg/dL (ref 0.44–1.00)
GFR, Estimated: 58 mL/min — ABNORMAL LOW (ref >60)
Glucose, Bld: 114 mg/dL — ABNORMAL HIGH (ref 70–99)
Potassium: 3.2 mmol/L — ABNORMAL LOW (ref 3.5–5.1)
Sodium: 135 mmol/L (ref 135–145)
Total Bilirubin: 0.6 mg/dL (ref 0.3–1.2)
Total Protein: 7.9 g/dL (ref 6.5–8.1)

## 2020-07-17 LAB — VITAMIN D 25 HYDROXY (VIT D DEFICIENCY, FRACTURES): Vit D, 25-Hydroxy: 38.34 ng/mL (ref 30–100)

## 2020-07-17 LAB — LACTATE DEHYDROGENASE: LDH: 112 U/L (ref 98–192)

## 2020-07-20 LAB — PROTEIN ELECTROPHORESIS, SERUM
A/G Ratio: 1 (ref 0.7–1.7)
Albumin ELP: 3.9 g/dL (ref 2.9–4.4)
Alpha-1-Globulin: 0.3 g/dL (ref 0.0–0.4)
Alpha-2-Globulin: 0.9 g/dL (ref 0.4–1.0)
Beta Globulin: 1.4 g/dL — ABNORMAL HIGH (ref 0.7–1.3)
Gamma Globulin: 1.4 g/dL (ref 0.4–1.8)
Globulin, Total: 3.9 g/dL (ref 2.2–3.9)
Total Protein ELP: 7.8 g/dL (ref 6.0–8.5)

## 2020-07-20 LAB — KAPPA/LAMBDA LIGHT CHAINS
Kappa free light chain: 44.9 mg/L — ABNORMAL HIGH (ref 3.3–19.4)
Kappa, lambda light chain ratio: 1.56 (ref 0.26–1.65)
Lambda free light chains: 28.8 mg/L — ABNORMAL HIGH (ref 5.7–26.3)

## 2020-07-20 NOTE — Telephone Encounter (Signed)
Chart reviewed. Revlimid refilled per Dr. Delton Coombes

## 2020-07-22 LAB — IMMUNOFIXATION ELECTROPHORESIS
IgA: 838 mg/dL — ABNORMAL HIGH (ref 64–422)
IgG (Immunoglobin G), Serum: 1365 mg/dL (ref 586–1602)
IgM (Immunoglobulin M), Srm: 16 mg/dL — ABNORMAL LOW (ref 26–217)
Total Protein ELP: 7.8 g/dL (ref 6.0–8.5)

## 2020-07-23 ENCOUNTER — Other Ambulatory Visit: Payer: Self-pay

## 2020-07-23 ENCOUNTER — Encounter (HOSPITAL_COMMUNITY): Payer: Self-pay | Admitting: Hematology

## 2020-07-23 ENCOUNTER — Inpatient Hospital Stay (HOSPITAL_COMMUNITY): Payer: Medicare HMO | Attending: Hematology | Admitting: Hematology

## 2020-07-23 ENCOUNTER — Inpatient Hospital Stay (HOSPITAL_COMMUNITY): Payer: Medicare HMO

## 2020-07-23 VITALS — BP 143/53 | HR 69 | Temp 96.9°F | Resp 18

## 2020-07-23 VITALS — BP 139/56 | HR 80 | Temp 96.0°F | Resp 18 | Wt 172.8 lb

## 2020-07-23 DIAGNOSIS — C9000 Multiple myeloma not having achieved remission: Secondary | ICD-10-CM | POA: Insufficient documentation

## 2020-07-23 MED ORDER — SODIUM CHLORIDE 0.9 % IV SOLN: Freq: Once | INTRAVENOUS | Status: AC

## 2020-07-23 MED ORDER — ZOLEDRONIC ACID 4 MG/5ML IV CONC: 4.0000 mg | Freq: Once | INTRAVENOUS | Status: DC

## 2020-07-23 MED ORDER — ZOLEDRONIC ACID 4 MG/100ML IV SOLN
4.0000 mg | Freq: Once | INTRAVENOUS | Status: AC
  Administered 2020-07-23: 4 mg via INTRAVENOUS
  Filled 2020-07-23: qty 100

## 2020-07-23 NOTE — Progress Notes (Signed)
[place], [place] [address]   CLINIC:  Medical Oncology/Hematology  PCP:  Asencion Noble, MD [address] / [place] [address]  [phone]  REASON FOR VISIT:  Follow-up for multiple myeloma  PRIOR THERAPY:  1. Radiation through 11/10/2017. 2. RVD x 5 cycles from 11/14/2017 to 02/19/2018. 3. Stem cell transplant on 04/03/2018.  CURRENT THERAPY: Maintenance Revlimid 10 mg 3/4 weeks  INTERVAL HISTORY:  [facility]ie [facility], a 80 y.o. female, returns for routine follow-up for her multiple myeloma. Jamie Ewing was last seen on 04/30/2020.  Today she reports feeling okay. She complains of her energy levels going down significantly since February; she feels tired throughout the day. She also complains of having more pain in her left hip and upper leg which worsens if she sits for a long time and improves with movement; she takes Tylenol 2 tablets if the pain becomes unbearable. She reports having occasional nausea, but denies V/D/C, CP, new aches or pains, lightheadedness or abdominal pain. She continues having constant numbness and tingling in her feet since her back surgery. She has not been walking recently but is planning on starting it back up. She is taking Revlimid 10 mg 3 weeks on with 1 week off; she is currently on her week off. She continues taking vitamin D and vitamin B12 1,000 mcg.   REVIEW OF SYSTEMS:  Review of Systems  Constitutional: Positive for appetite change and fatigue (25%).  Cardiovascular: Negative for chest pain.  Gastrointestinal: Positive for nausea (occasional). Negative for abdominal pain, constipation, diarrhea and vomiting.  Musculoskeletal: Positive for arthralgias (L hip pain). Negative for myalgias.  Neurological: Positive for numbness (constant in both feet). Negative for light-headedness.  All other systems reviewed and are negative.   PAST MEDICAL/SURGICAL HISTORY:  Past Medical History:  Diagnosis Date   . Back pain   . Hypercholesteremia   . Hypertension    Past Surgical History:  Procedure Laterality Date  . BONE MARROW BIOPSY    . LAMINECTOMY N/A 10/06/2017   Procedure: THORACIC NINE AND TEN LAMINECTOMY WITH RESECTION OF TUMOR, THORACIC EIGHT TO THORACIC ELEVEN FUSION WITH PEDICLE SCREW FIXATION;  Surgeon: Earnie Larsson, MD;  Location: [facility];  Service: Neurosurgery;  Laterality: N/A;  . REPLACEMENT TOTAL KNEE Left 2003  . TOTAL KNEE ARTHROPLASTY  2001    SOCIAL HISTORY:  Social History   Socioeconomic History  . Marital status: Widowed    Spouse name: Not on file  . Number of children: 2  . Years of education: Not on file  . Highest education level: Not on file  Occupational History  . Not on file  Tobacco Use  . Smoking status: Former Smoker    Packs/day: 0.50    Years: 30.00    Pack years: 15.00    Types: Cigarettes    Quit date: 05/31/2006    Years since quitting: 14.1  . Smokeless tobacco: Never Used  Vaping Use  . Vaping Use: Never used  Substance and Sexual Activity  . Alcohol use: Yes    Comment: wine occassionally  . Drug use: No  . Sexual activity: Not on file  Other Topics Concern  . Not on file  Social History Narrative  . Not on file   Social Determinants of Health   Financial Resource Strain: Not on file  Food Insecurity: Not on file  Transportation Needs: Not on file  Physical Activity: Not on file  Stress: Not on file  Social Connections: Not on file  Intimate Partner Violence: Not on file    FAMILY HISTORY:  Family History  Problem Relation Age of Onset  . Tuberculosis Mother   . Kidney failure Father   . Hypertension Paternal Aunt   . Diabetes Paternal Aunt   . Hypertension Paternal Uncle   . Diabetes Paternal Uncle   . Hypertension Daughter   . Stroke Daughter     CURRENT MEDICATIONS:  Current Outpatient Medications  Medication Sig Dispense Refill  . acetaminophen (TYLENOL) 500 MG tablet Take by mouth.     [name] amLODipine  (NORVASC) 5 MG tablet Take 5 mg by mouth daily.     . calcium carbonate (OSCAL) 1500 (600 Ca) MG TABS tablet Take by mouth.    . hydrochlorothiazide (HYDRODIURIL) 25 MG tablet Take 25 mg by mouth daily.    [name] lenalidomide (REVLIMID) 10 MG capsule TAKE 1 CAPSULE BY MOUTH EVERY DAY FOR 21 DAYS ON THEN 7 DAYS OFF 21 capsule 0  . LOW-DOSE ASPIRIN PO Asprin Ec Low Dose    . Multiple Vitamins-Minerals (THERA-M) TABS Thera-M 9 mg iron-400 mcg tablet    . polyethylene glycol (MIRALAX / GLYCOLAX) packet Take 17 g by mouth as needed.     . potassium chloride SA (KLOR-CON) 20 MEQ tablet Take 1 tablet (20 mEq total) by mouth 2 (two) times daily. 180 tablet 2  . temazepam (RESTORIL) 15 MG capsule TAKE 1 CAPSULE BY MOUTH AT BEDTIME AS NEEDED FOR SLEEP 30 capsule 0  . vitamin B-12 (CYANOCOBALAMIN) 1000 MCG tablet Vitamin B12     No current facility-administered medications for this visit.    ALLERGIES:  No Known Allergies  PHYSICAL EXAM:  Performance status (ECOG): 1 - Symptomatic but completely ambulatory  Vitals:   07/23/20 1104  BP: (!) 139/56  Pulse: 80  Resp: 18  Temp: (!) 96 F (35.6 C)  SpO2: 100%   Wt Readings from Last 3 Encounters:  07/23/20 172 lb 12.8 oz (78.4 kg)  04/30/20 169 lb 12.8 oz (77 kg)  02/06/20 171 lb (77.6 kg)   Physical Exam Vitals reviewed.  Constitutional:      Appearance: Normal appearance.  Cardiovascular:     Rate and Rhythm: Normal rate and regular rhythm.     Pulses: Normal pulses.     Heart sounds: Normal heart sounds.  Pulmonary:     Effort: Pulmonary effort is normal.     Breath sounds: Normal breath sounds.  Musculoskeletal:     Right lower leg: No edema.     Left lower leg: No edema.  Neurological:     General: No focal deficit present.     Mental Status: She is alert and oriented to person, place, and time.  Psychiatric:        Mood and Affect: Mood normal.        Behavior: Behavior normal.     LABORATORY DATA:  I have reviewed the labs  as listed.  CBC Latest Ref Rng & Units 07/17/2020 04/30/2020 02/06/2020  WBC 4.0 - 10.5 K/uL 3.7(L) 4.5 4.3  Hemoglobin 12.0 - 15.0 g/dL 12.6 12.6 12.0  Hematocrit 36.0 - 46.0 % 38.4 38.4 36.0  Platelets 150 - 400 K/uL 199 217 201   CMP Latest Ref Rng & Units 07/17/2020 04/30/2020 02/06/2020  Glucose 70 - 99 mg/dL 114(H) 106(H) 96  BUN 8 - 23 mg/dL $Remove'16 19 19  'eUwwTBR$ Creatinine 0.44 - 1.00 mg/dL 1.00 1.00 0.93  Sodium 135 - 145 mmol/L 135  134(L) 135  Potassium 3.5 - 5.1 mmol/L 3.2(L) 3.6 4.0  Chloride 98 - 111 mmol/L 101 104 103  CO2 22 - 32 mmol/L $RemoveB'23 23 24  'QTugjzjm$ Calcium 8.9 - 10.3 mg/dL 9.3 10.1 9.5  Total Protein 6.5 - 8.1 g/dL 7.9 7.9 7.3  Total Bilirubin 0.3 - 1.2 mg/dL 0.6 0.8 0.7  Alkaline Phos 38 - 126 U/L 55 46 54  AST 15 - 41 U/L $Remo'21 16 15  'TVJpM$ ALT 0 - 44 U/L $Remo'21 16 17      'WtWTi$ Component Value Date/Time   RBC 3.76 (L) 07/17/2020 1128   MCV 102.1 (H) 07/17/2020 1128   MCH 33.5 07/17/2020 1128   MCHC 32.8 07/17/2020 1128   RDW 14.0 07/17/2020 1128   LYMPHSABS 1.6 07/17/2020 1128   MONOABS 0.6 07/17/2020 1128   EOSABS 0.2 07/17/2020 1128   BASOSABS 0.0 07/17/2020 1128   Lab Results  Component Value Date   VD25OH 38.34 07/17/2020   Lab Results  Component Value Date   TOTALPROTELP 7.8 07/17/2020   TOTALPROTELP 7.8 07/17/2020   ALBUMINELP 3.9 07/17/2020   A1GS 0.3 07/17/2020   A2GS 0.9 07/17/2020   BETS 1.4 (H) 07/17/2020   GAMS 1.4 07/17/2020   MSPIKE Not Observed 07/17/2020   SPEI Comment 07/17/2020    Lab Results  Component Value Date   KPAFRELGTCHN 44.9 (H) 07/17/2020   LAMBDASER 28.8 (H) 07/17/2020   KAPLAMBRATIO 1.56 07/17/2020    DIAGNOSTIC IMAGING:  I have independently reviewed the scans and discussed with the patient. DG Bone Density  Result Date: 07/17/2020 EXAM: DUAL X-RAY ABSORPTIOMETRY (DXA) FOR BONE MINERAL DENSITY IMPRESSION: Your patient Maeson Lourenco completed a BMD test on 07/17/2020 using the [place] (software version: 14.10) manufactured by [employer]. The following summarizes the results of our evaluation. Technologist: BRG PATIENT BIOGRAPHICAL: Name: Joelle, [place] Patient ID:  [id number] Birth Date: 1940/12/08 Height:     63.0 in. Gender:      Female    Exam Date:  07/17/2020 Weight:     169.8 lbs. Indications: Fractures: Treatments: Calcium, Vitamin D, Zometa (IV) DENSITOMETRY RESULTS: Site      Region     Measured Date Measured Age WHO Classification Young Adult T-score BMD         %Change vs. Previous Significant Change (*) AP Spine L1-L3 (L2) 07/17/[age] 78.10 Normal 1.5 1.346 g/cm2 DualFemur Neck Right 07/17/2020 78.9 Normal -0.6 0.961 g/cm2 ASSESSMENT: The BMD measured at Femur Neck Right is 0.961 g/cm2 with a T-score of -0.6. This patient is considered normal according to [facility]) criteria. L2 and L4 was excluded due to advanced degenerative changes. The scan quality is good. Patient is not a candidate for FRAX assessment due to normal exam. Per official position of the ISCD, it is not possible to quantitatively compare BMD or calculate a LSC between different facilities or devices. World Pharmacologist [facility]) criteria for post-menopausal, Caucasian Women: Normal:       T-score at or above -1 SD Osteopenia:   T-score between -1 and -2.5 SD Osteoporosis: T-score at or below -2.5 SD RECOMMENDATIONS: 1. All patients should optimize calcium and vitamin D intake. 2. Consider FDA-approved medical therapies in postmenopausal women and med aged 71 years and older, based on the following: a. A hip or vertebral (clinical or morphometric) fracture b. T-score< -2.5 at the femoral neck or spine after appropriate evaluation to exclude secondary causes c. Low bone mass (T-score between -1.0 and -2.5 at  the femoral neck or spine) and a 10-year probability of a hip fracture > 3% or a 10-year probability of a major osteoporosis-related fracture > 20% based on the [place]-adapted WHO algorithm d. Clinician judgment and/or patient  preferences may indicate treatment for people with 10-year fracture probabilities above or below these levels 1. All patients should optimize calcium and vitamin D intake. 2. Consider FDA-approved medical therapies in postmenopausal women and med aged 58 years and older, based on the following: a. A hip or vertebral (clinical or morphometric) fracture b. T-score < -2.5 at the femoral neck or spine after appropriate evaluation to exclude secondary causes c. Low bone mass (T-score between -1.0 and -2.5 at the femoral neck or spine) and a 10-year probability of a hip fracture > 3% or a 10-year probability of a major osteoporosis-related fracture > 20% based on the [place]-adapted WHO algorithm d. Clinician judgment and/or patient preferences may indicate treatment for people with 10-year fracture probabilities above or below these levels FOLLOW-UP: People with diagnosed cases of osteoporosis or at high risk for fracture should have regular bone mineral density tests. For patients eligible for Medicare, routine testing is allowed once every 2 years. The testing frequency can be increased to one year for patients who have rapidly progressing disease, those who are receiving or discontinuing medical therapy to restore bone mass, or have additional risk factors. Patients with diagnosis of osteoporosis or at high risk fracture should have regular bone mineral density tests. For patients eligible for Medicare, routine testing is allowed once every 2 years. Testing frequency can be increased to on year for patients who have rapidly progressing disease, those who are receiving medical therapy to restore bone mass, or have additional risk factors. I have reviewed this report, and agree with the above findings. [facility] Electronically Signed   By: Lowella Grip III M.D.   On: 07/17/2020 15:43     ASSESSMENT:  1. Stage II IgA kappa multiple myeloma, standard risk: -XRT to the spine completed on November 10, 2017 following T9-T10 laminectomy with resection of epidural tumor. -5 cycles of RVD from 11/14/2017 through 02/19/2018 followed by stem cell transplant on April 03, 2018. -Revlimid maintenance therapy 10 mg 3 weeks on/1 week off started on August 24, 2018. -PET scan on 09/02/2019 showed redemonstrated bilateral cervical, right supraclavicular, right axillary hypermetabolic adenopathy with interval resolution of left axillary adenopathy. Similar lytic and sclerotic lesions in bones with no corresponding FDG uptake. Persistent increased FDG uptake in both palatine tonsils. -Reviewed myeloma panel from 09/26/2019. SPEP and immunofixation is negative. Free light chain ratio is normal with kappa light chains elevated at 32.5. -Right neck lymph node biopsy on 10/21/2019 at [facility] was negative for malignancy.  2. Myeloma bone disease: -Zometa was started on 11/21/2017, resumed after transplant on 09/06/2018.   PLAN:  1. Stage II IgA kappa multiple myeloma, standard risk: -Continue Revlimid 3 weeks on/1 week off. - Reviewed myeloma labs from 07/17/2020 which showed negative SPEP and immunofixation.  Free light chain ratio is stable at 1.56.  Kappa light chains are increased to 44.9. - She reported mild worsening of fatigue and left hip stiffness which improves with activity.  I have recommended her to start walking again to improve the fatigue. - RTC 3 months with repeat myeloma labs.  2. Myeloma bone disease: -Continue Zometa every 3 months for a total of 2 years. - Reviewed DEXA scan from 07/17/2020 which showed T score -0.6.  3. Thromboprophylaxis: -Continue  aspirin 81 mg daily.  4. Hypertension: -Continue amlodipine and HCTZ.  5.  Peripheral neuropathy: - She has tingling and numbness in the feet, which is constant since her back surgery.  No pharmacological intervention needed.  Orders placed this encounter:  Orders Placed This Encounter  Procedures  . CBC with  Differential/Platelet  . Comprehensive metabolic panel  . Immunofixation electrophoresis  . Protein electrophoresis, serum  . Lactate dehydrogenase  . Kappa/lambda light chains     Derek Jack, MD [place] [phone]   I, Milinda Antis, am acting as a scribe for Dr. Sanda Linger.  I, Derek Jack MD, have reviewed the above documentation for accuracy and completeness, and I agree with the above.

## 2020-07-23 NOTE — Progress Notes (Signed)
Patient presents today for Zometa infusion.  Vital signs and labs within parameters for treatment.  Patient has no new complaints since last visit.  Message received from [facility] RN/Dr. Delton Coombes patient okay for treatment.  Peripheral IV started and blood return noted pre and post infusion.  Zometa infusion given today per MD orders.  Stable during infusion without adverse affects.  Vital signs stable.  No complaints at this time.  Discharge from clinic ambulatory in stable condition.  Alert and oriented X 3.  Follow up with [facility] as scheduled.

## 2020-07-23 NOTE — Progress Notes (Signed)
Patient assessed by Dr Delton Coombes and labs reviewed.  Okay for injection today.  No complaints or concerns noted.

## 2020-07-23 NOTE — Patient Instructions (Signed)
[place] at [facility] Discharge Instructions  You were seen today by Dr. Delton Coombes. He went over your recent results. You received your Zometa injection today. Continue taking Revlimid 3 weeks on with 1 week off. Continue walking and staying active. Dr. Delton Coombes will see you back in 3 months for labs and follow up.   Thank you for choosing [place] at [facility] to provide your oncology and hematology care.  To afford each patient quality time with our provider, please arrive at least 15 minutes before your scheduled appointment time.   If you have a lab appointment with the [facility] please come in thru the Main Entrance and check in at the main information desk  You need to re-schedule your appointment should you arrive 10 or more minutes late.  We strive to give you quality time with our providers, and arriving late affects you and other patients whose appointments are after yours.  Also, if you no show three or more times for appointments you may be dismissed from the clinic at the providers discretion.     Again, thank you for choosing [facility].  Our hope is that these requests will decrease the amount of time that you wait before being seen by our physicians.       _____________________________________________________________  Should you have questions after your visit to [facility], please contact our office at [phone] between the hours of 8:00 a.m. and 4:30 p.m.  Voicemails left after 4:00 p.m. will not be returned until the following business day.  For prescription refill requests, have your pharmacy contact our office and allow 72 hours.    Cancer Center Support Programs:   > Cancer Support Group  2nd Tuesday of the month 1pm-2pm, Journey Room

## 2020-07-23 NOTE — Patient Instructions (Signed)
[facility]  Discharge Instructions: °Thank you for choosing [facility] to provide your oncology and hematology care.  °If you have a lab appointment with the Cancer Center, please come in thru the Main Entrance and check in at the main information desk. ° °Wear comfortable clothing and clothing appropriate for easy access to any Portacath or PICC line.  ° °We strive to give you quality time with your provider. You may need to reschedule your appointment if you arrive late (15 or more minutes).  Arriving late affects you and other patients whose appointments are after yours.  Also, if you miss three or more appointments without notifying the office, you may be dismissed from the clinic at the provider’s discretion.    °  °For prescription refill requests, have your pharmacy contact our office and allow 72 hours for refills to be completed.   ° °Today you received the following chemotherapy and/or immunotherapy agents Zometa    °  °To help prevent nausea and vomiting after your treatment, we encourage you to take your nausea medication as directed. ° °BELOW ARE SYMPTOMS THAT SHOULD BE REPORTED IMMEDIATELY: °*FEVER GREATER THAN 100.4 F (38 °C) OR HIGHER °*CHILLS OR SWEATING °*NAUSEA AND VOMITING THAT IS NOT CONTROLLED WITH YOUR NAUSEA MEDICATION °*UNUSUAL SHORTNESS OF BREATH °*UNUSUAL BRUISING OR BLEEDING °*URINARY PROBLEMS (pain or burning when urinating, or frequent urination) °*BOWEL PROBLEMS (unusual diarrhea, constipation, pain near the anus) °TENDERNESS IN MOUTH AND THROAT WITH OR WITHOUT PRESENCE OF ULCERS (sore throat, sores in mouth, or a toothache) °UNUSUAL RASH, SWELLING OR PAIN  °UNUSUAL VAGINAL DISCHARGE OR ITCHING  ° °Items with * indicate a potential emergency and should be followed up as soon as possible or go to the Emergency Department if any problems should occur. ° °Please show the CHEMOTHERAPY ALERT CARD or IMMUNOTHERAPY ALERT CARD at check-in to the Emergency  Department and triage nurse. ° °Should you have questions after your visit or need to cancel or reschedule your appointment, please contact  CANCER CENTER [phone]  and follow the prompts.  Office hours are 8:00 a.m. to 4:30 p.m. Monday - Friday. Please note that voicemails left after 4:00 p.m. may not be returned until the following business day.  We are closed weekends and major holidays. You have access to a nurse at all times for urgent questions. Please call the main number to the clinic [phone] and follow the prompts. ° °For any non-urgent questions, you may also contact your provider using MyChart. We now offer e-Visits for anyone 18 and older to request care online for non-urgent symptoms. For details visit [email or web address]. °  °Also download the MyChart app! Go to the app store, search "MyChart", open the app, select [place], and log in with your MyChart username and password. ° °Due to Covid, a mask is required upon entering the hospital/clinic. If you do not have a mask, one will be given to you upon arrival. For doctor visits, patients may have 1 support person aged 18 or older with them. For treatment visits, patients cannot have anyone with them due to current Covid guidelines and our immunocompromised population.  °

## 2020-08-09 ENCOUNTER — Other Ambulatory Visit (HOSPITAL_COMMUNITY): Payer: Self-pay | Admitting: Hematology

## 2020-08-09 DIAGNOSIS — C9001 Multiple myeloma in remission: Secondary | ICD-10-CM

## 2020-08-13 ENCOUNTER — Other Ambulatory Visit (HOSPITAL_COMMUNITY): Payer: Self-pay | Admitting: *Deleted

## 2020-08-13 ENCOUNTER — Other Ambulatory Visit (HOSPITAL_COMMUNITY): Payer: Self-pay

## 2020-08-13 ENCOUNTER — Encounter (HOSPITAL_COMMUNITY): Payer: Self-pay | Admitting: Hematology

## 2020-08-13 DIAGNOSIS — C9001 Multiple myeloma in remission: Secondary | ICD-10-CM

## 2020-08-13 MED ORDER — LENALIDOMIDE 10 MG PO CAPS: ORAL_CAPSULE | ORAL | 0 refills | Status: DC

## 2020-08-13 NOTE — Telephone Encounter (Signed)
Chart reviewed. Revlimid refilled per Dr. Delton Coombes

## 2020-08-14 ENCOUNTER — Encounter (HOSPITAL_COMMUNITY): Payer: Self-pay | Admitting: Hematology

## 2020-09-05 ENCOUNTER — Other Ambulatory Visit (HOSPITAL_COMMUNITY): Payer: Self-pay | Admitting: Hematology

## 2020-09-05 DIAGNOSIS — C9001 Multiple myeloma in remission: Secondary | ICD-10-CM

## 2020-09-09 ENCOUNTER — Other Ambulatory Visit (HOSPITAL_COMMUNITY): Payer: Self-pay

## 2020-09-09 DIAGNOSIS — C9001 Multiple myeloma in remission: Secondary | ICD-10-CM

## 2020-09-09 MED ORDER — LENALIDOMIDE 10 MG PO CAPS: ORAL_CAPSULE | ORAL | 0 refills | Status: DC

## 2020-09-09 NOTE — Telephone Encounter (Signed)
Chart reviewed. Revlimid refilled per Dr. Delton Coombes

## 2020-09-11 ENCOUNTER — Encounter (HOSPITAL_COMMUNITY): Payer: Self-pay | Admitting: Hematology

## 2020-10-01 ENCOUNTER — Other Ambulatory Visit (HOSPITAL_COMMUNITY): Payer: Self-pay | Admitting: Hematology

## 2020-10-01 DIAGNOSIS — C9001 Multiple myeloma in remission: Secondary | ICD-10-CM

## 2020-10-05 ENCOUNTER — Encounter (HOSPITAL_COMMUNITY): Payer: Self-pay | Admitting: Hematology

## 2020-10-05 ENCOUNTER — Other Ambulatory Visit (HOSPITAL_COMMUNITY): Payer: Self-pay | Admitting: Hematology

## 2020-10-05 DIAGNOSIS — E876 Hypokalemia: Secondary | ICD-10-CM

## 2020-10-05 NOTE — Telephone Encounter (Signed)
Chart reviewed. Revlimid refilled per Dr. Tomie China last office visit.

## 2020-10-08 ENCOUNTER — Inpatient Hospital Stay (HOSPITAL_COMMUNITY): Payer: Medicare HMO | Attending: Hematology

## 2020-10-08 ENCOUNTER — Other Ambulatory Visit: Payer: Self-pay

## 2020-10-08 DIAGNOSIS — C9000 Multiple myeloma not having achieved remission: Secondary | ICD-10-CM | POA: Diagnosis not present

## 2020-10-08 LAB — COMPREHENSIVE METABOLIC PANEL
ALT: 18 U/L (ref 0–44)
AST: 20 U/L (ref 15–41)
Albumin: 4 g/dL (ref 3.5–5.0)
Alkaline Phosphatase: 46 U/L (ref 38–126)
Anion gap: 10 (ref 5–15)
BUN: 16 mg/dL (ref 8–23)
CO2: 23 mmol/L (ref 22–32)
Calcium: 9.4 mg/dL (ref 8.9–10.3)
Chloride: 99 mmol/L (ref 98–111)
Creatinine, Ser: 1.07 mg/dL — ABNORMAL HIGH (ref 0.44–1.00)
GFR, Estimated: 53 mL/min — ABNORMAL LOW (ref >60)
Glucose, Bld: 111 mg/dL — ABNORMAL HIGH (ref 70–99)
Potassium: 3.6 mmol/L (ref 3.5–5.1)
Sodium: 132 mmol/L — ABNORMAL LOW (ref 135–145)
Total Bilirubin: 0.7 mg/dL (ref 0.3–1.2)
Total Protein: 8.1 g/dL (ref 6.5–8.1)

## 2020-10-08 LAB — CBC WITH DIFFERENTIAL/PLATELET
Abs Immature Granulocytes: 0.02 10*3/uL (ref 0.00–0.07)
Basophils Absolute: 0 10*3/uL (ref 0.0–0.1)
Basophils Relative: 1 %
Eosinophils Absolute: 0.2 10*3/uL (ref 0.0–0.5)
Eosinophils Relative: 5 %
HCT: 37.5 % (ref 36.0–46.0)
Hemoglobin: 12.7 g/dL (ref 12.0–15.0)
Immature Granulocytes: 1 %
Lymphocytes Relative: 34 %
Lymphs Abs: 1.3 10*3/uL (ref 0.7–4.0)
MCH: 35.1 pg — ABNORMAL HIGH (ref 26.0–34.0)
MCHC: 33.9 g/dL (ref 30.0–36.0)
MCV: 103.6 fL — ABNORMAL HIGH (ref 80.0–100.0)
Monocytes Absolute: 0.7 10*3/uL (ref 0.1–1.0)
Monocytes Relative: 19 %
Neutro Abs: 1.5 10*3/uL — ABNORMAL LOW (ref 1.7–7.7)
Neutrophils Relative %: 40 %
Platelets: 177 10*3/uL (ref 150–400)
RBC: 3.62 MIL/uL — ABNORMAL LOW (ref 3.87–5.11)
RDW: 14 % (ref 11.5–15.5)
WBC: 3.7 10*3/uL — ABNORMAL LOW (ref 4.0–10.5)
nRBC: 0 % (ref 0.0–0.2)

## 2020-10-08 LAB — LACTATE DEHYDROGENASE: LDH: 112 U/L (ref 98–192)

## 2020-10-09 LAB — PROTEIN ELECTROPHORESIS, SERUM
A/G Ratio: 1.1 (ref 0.7–1.7)
Albumin ELP: 4 g/dL (ref 2.9–4.4)
Alpha-1-Globulin: 0.2 g/dL (ref 0.0–0.4)
Alpha-2-Globulin: 0.7 g/dL (ref 0.4–1.0)
Beta Globulin: 1.4 g/dL — ABNORMAL HIGH (ref 0.7–1.3)
Gamma Globulin: 1.3 g/dL (ref 0.4–1.8)
Globulin, Total: 3.6 g/dL (ref 2.2–3.9)
Total Protein ELP: 7.6 g/dL (ref 6.0–8.5)

## 2020-10-09 LAB — KAPPA/LAMBDA LIGHT CHAINS
Kappa free light chain: 48.5 mg/L — ABNORMAL HIGH (ref 3.3–19.4)
Kappa, lambda light chain ratio: 1.63 (ref 0.26–1.65)
Lambda free light chains: 29.8 mg/L — ABNORMAL HIGH (ref 5.7–26.3)

## 2020-10-10 LAB — IMMUNOFIXATION ELECTROPHORESIS
IgA: 886 mg/dL — ABNORMAL HIGH (ref 64–422)
IgG (Immunoglobin G), Serum: 1287 mg/dL (ref 586–1602)
IgM (Immunoglobulin M), Srm: 17 mg/dL — ABNORMAL LOW (ref 26–217)
Total Protein ELP: 7.3 g/dL (ref 6.0–8.5)

## 2020-10-14 NOTE — Progress Notes (Signed)
[place], [place] [address]   CLINIC:  Medical Oncology/Hematology  PCP:  Asencion Noble, MD [address] / [place] [address] [phone]   REASON FOR VISIT:  Follow-up for multiple myeloma  PRIOR THERAPY:  1. Radiation through 11/10/2017. 2. RVD x 5 cycles from 11/14/2017 to 02/19/2018. 3. Stem cell transplant on 04/03/2018.  NGS Results: not done  CURRENT THERAPY: Maintenance Revlimid 10 mg 3/4 weeks  BRIEF ONCOLOGIC HISTORY:  Oncology History  Multiple myeloma ([place])  10/10/2017 Initial Diagnosis   Multiple myeloma ([place])    11/14/2017 - 02/26/2018 Chemotherapy   The patient had dexamethasone (DECADRON) tablet 40 mg, 40 mg (100 % of original dose 40 mg), Oral,  Once, 1 of 1 cycle Dose modification: 40 mg (original dose 40 mg, Cycle 1) Administration: 40 mg (11/14/2017) dexamethasone (DECADRON) 4 MG tablet, 1 of 1 cycle, Start date: 11/02/2017, End date: 03/12/2018 lenalidomide (REVLIMID) 25 MG capsule, 1 of 1 cycle, Start date: 02/28/2018, End date: 03/12/2018 bortezomib SQ (VELCADE) chemo injection 2.5 mg, 1.3 mg/m2 = 2.5 mg, Subcutaneous,  Once, 5 of 6 cycles Administration: 2.5 mg (11/14/2017), 2.5 mg (11/21/2017), 2.5 mg (11/28/2017), 2.5 mg (12/14/2017), 2.5 mg (12/21/2017), 2.5 mg (12/28/2017), 2.5 mg (01/04/2018), 2.5 mg (01/11/2018), 2.5 mg (01/18/2018), 2.5 mg (01/25/2018), 2.5 mg (02/01/2018), 2.5 mg (02/08/2018), 2.5 mg (02/19/2018), 2.5 mg (02/26/2018)   for chemotherapy treatment.       CANCER STAGING: Cancer Staging No matching staging information was found for the patient.  INTERVAL HISTORY:  Ms. SALLY MENARD, a 80 y.o. female, returns for routine follow-up of her multiple myeloma. Amoni was last seen on 07/23/20.   Today she reports feeling well. She denies any new pains or recent infections, but she reports unusual levels of fatigue. She also reports stable left hip pain and stiffness that improves with  movement as well as stable numbness and tingling in her fingers and feet.  REVIEW OF SYSTEMS:  Review of Systems  Constitutional:  Positive for appetite change (60%) and fatigue (50%).  Gastrointestinal:  Positive for diarrhea.  Musculoskeletal:  Positive for arthralgias (4/10 L hip).  Neurological:  Positive for numbness (hands and feet).  All other systems reviewed and are negative.  PAST MEDICAL/SURGICAL HISTORY:  Past Medical History:  Diagnosis Date   Back pain    Hypercholesteremia    Hypertension    Past Surgical History:  Procedure Laterality Date   BONE MARROW BIOPSY     LAMINECTOMY N/A 10/06/2017   Procedure: THORACIC NINE AND TEN LAMINECTOMY WITH RESECTION OF TUMOR, THORACIC EIGHT TO THORACIC ELEVEN FUSION WITH PEDICLE SCREW FIXATION;  Surgeon: Earnie Larsson, MD;  Location: [facility];  Service: Neurosurgery;  Laterality: N/A;   REPLACEMENT TOTAL KNEE Left 2003   TOTAL KNEE ARTHROPLASTY  2001    SOCIAL HISTORY:  Social History   Socioeconomic History   Marital status: Widowed    Spouse name: Not on file   Number of children: 2   Years of education: Not on file   Highest education level: Not on file  Occupational History   Not on file  Tobacco Use   Smoking status: Former    Packs/day: 0.50    Years: 30.00    Pack years: 15.00    Types: Cigarettes    Quit date: 05/31/2006    Years since quitting: 14.3   Smokeless tobacco: Never  Vaping Use   Vaping Use: Never used  Substance and Sexual Activity  Alcohol use: Yes    Comment: wine occassionally   Drug use: No   Sexual activity: Not on file  Other Topics Concern   Not on file  Social History Narrative   Not on file   Social Determinants of Health   Financial Resource Strain: Not on file  Food Insecurity: Not on file  Transportation Needs: Not on file  Physical Activity: Not on file  Stress: Not on file  Social Connections: Not on file  Intimate Partner Violence: Not on file    FAMILY HISTORY:   Family History  Problem Relation Age of Onset   Tuberculosis Mother    Kidney failure Father    Hypertension Paternal Aunt    Diabetes Paternal Aunt    Hypertension Paternal Uncle    Diabetes Paternal Uncle    Hypertension Daughter    Stroke Daughter     CURRENT MEDICATIONS:  Current Outpatient Medications  Medication Sig Dispense Refill   acetaminophen (TYLENOL) 500 MG tablet Take by mouth.      amLODipine (NORVASC) 5 MG tablet Take 5 mg by mouth daily.      calcium carbonate (OSCAL) 1500 (600 Ca) MG TABS tablet Take by mouth.     hydrochlorothiazide (HYDRODIURIL) 25 MG tablet Take 25 mg by mouth daily.     lenalidomide (REVLIMID) 10 MG capsule TAKE 1 CAPSULE BY MOUTH EVERY DAY FOR 21 DAYS ON THEN 7 DAYS OFF 21 capsule 0   LOW-DOSE ASPIRIN PO Asprin Ec Low Dose     Multiple Vitamins-Minerals (THERA-M) TABS Thera-M 9 mg iron-400 mcg tablet     polyethylene glycol (MIRALAX / GLYCOLAX) packet Take 17 g by mouth as needed.      potassium chloride SA (KLOR-CON) 20 MEQ tablet TAKE 1 TABLET TWICE DAILY 180 tablet 2   vitamin B-12 (CYANOCOBALAMIN) 1000 MCG tablet Vitamin B12     No current facility-administered medications for this visit.    ALLERGIES:  No Known Allergies  PHYSICAL EXAM:  Performance status (ECOG): 1 - Symptomatic but completely ambulatory  Vitals:   10/15/20 1258  BP: 129/69  Pulse: 84  Resp: 18  Temp: 98.1 F (36.7 C)  SpO2: 95%   Wt Readings from Last 3 Encounters:  10/15/20 166 lb 6.4 oz (75.5 kg)  07/23/20 172 lb 12.8 oz (78.4 kg)  04/30/20 169 lb 12.8 oz (77 kg)   Physical Exam Vitals reviewed.  Constitutional:      Appearance: Normal appearance.  Cardiovascular:     Rate and Rhythm: Normal rate and regular rhythm.     Pulses: Normal pulses.     Heart sounds: Normal heart sounds.  Pulmonary:     Effort: Pulmonary effort is normal.     Breath sounds: Normal breath sounds.  Neurological:     General: No focal deficit present.     Mental  Status: She is alert and oriented to person, place, and time.  Psychiatric:        Mood and Affect: Mood normal.        Behavior: Behavior normal.     LABORATORY DATA:  I have reviewed the labs as listed.  CBC Latest Ref Rng & Units 10/08/2020 07/17/2020 04/30/2020  WBC 4.0 - 10.5 K/uL 3.7(L) 3.7(L) 4.5  Hemoglobin 12.0 - 15.0 g/dL 12.7 12.6 12.6  Hematocrit 36.0 - 46.0 % 37.5 38.4 38.4  Platelets 150 - 400 K/uL 177 199 217   CMP Latest Ref Rng & Units 10/08/2020 07/17/2020 04/30/2020  Glucose 70 - 99  mg/dL 111(H) 114(H) 106(H)  BUN 8 - 23 mg/dL $Remove'16 16 19  'GdQrfBs$ Creatinine 0.44 - 1.00 mg/dL 1.07(H) 1.00 1.00  Sodium 135 - 145 mmol/L 132(L) 135 134(L)  Potassium 3.5 - 5.1 mmol/L 3.6 3.2(L) 3.6  Chloride 98 - 111 mmol/L 99 101 104  CO2 22 - 32 mmol/L $RemoveB'23 23 23  'MLcfyjbB$ Calcium 8.9 - 10.3 mg/dL 9.4 9.3 10.1  Total Protein 6.5 - 8.1 g/dL 8.1 7.9 7.9  Total Bilirubin 0.3 - 1.2 mg/dL 0.7 0.6 0.8  Alkaline Phos 38 - 126 U/L 46 55 46  AST 15 - 41 U/L $Remo'20 21 16  'UZrqv$ ALT 0 - 44 U/L $Remo'18 21 16    'fbfLz$ DIAGNOSTIC IMAGING:  I have independently reviewed the scans and discussed with the patient. No results found.   ASSESSMENT:  1.  Stage II IgA kappa multiple myeloma, standard risk: -XRT to the spine completed on November 10, 2017 following T9-T10 laminectomy with resection of epidural tumor. -5 cycles of RVD from 11/14/2017 through 02/19/2018 followed by stem cell transplant on April 03, 2018. -Revlimid maintenance therapy 10 mg 3 weeks on/1 week off started on August 24, 2018. -PET scan on 09/02/2019 showed redemonstrated bilateral cervical, right supraclavicular, right axillary hypermetabolic adenopathy with interval resolution of left axillary adenopathy.  Similar lytic and sclerotic lesions in bones with no corresponding FDG uptake.  Persistent increased FDG uptake in both palatine tonsils. -Reviewed myeloma panel from 09/26/2019.  SPEP and immunofixation is negative.  Free light chain ratio is normal with kappa light chains  elevated at 32.5. -Right neck lymph node biopsy on 10/21/2019 at [facility] was negative for malignancy.   2.  Myeloma bone disease: -Zometa was started on 11/21/2017, resumed after transplant on 09/06/2018.   PLAN:  1.  Stage II IgA kappa multiple myeloma, standard risk: - We reviewed myeloma panel from 10/08/2020.  M spike is negative.  Immunofixation was normal.  Kappa light chains have slightly increased to 48 from 44.  Ratio is normal at 1.63.  Creatinine has slightly increased to 1.07 and has progressively increased in the last few visits.  This could be contributing to elevated kappa light chains.  CBC was grossly normal.  Calcium was normal. - Continue Revlimid maintenance 10 mg 3 weeks on/1 week. - She reports tiredness in the last few months.  She lost about 5 pounds in the last 3 months.  She does report decrease in appetite.  She is not walking which she previously used to do every day.  I have encouraged her to be physically active which could potentially increase her energy levels and appetite. - RTC 3 months for follow-up.   2.  Myeloma bone disease: - DEXA scan on 07/17/2020 was normal. - Continue Zometa every 3 months which is being tolerated well.   3.  Thromboprophylaxis: - Continue aspirin 81 mg daily.   4.  Hypertension: - Continue amlodipine and HCTZ.  Blood pressure today is 129/69.   5.  Peripheral neuropathy: - Mild tingling or numbness in the hands and feet has been stable. - No pharmacological intervention necessary.   Orders placed this encounter:  Orders Placed This Encounter  Procedures   CBC with Differential   Comprehensive metabolic panel   Magnesium   Lactate dehydrogenase   Protein electrophoresis, serum   Kappa/lambda light chains   Immunofixation electrophoresis     Derek Jack, MD [place] [phone]   I, Thana Ates, am acting as a scribe for Dr. Dirk Dress  Shilo [place].  I, Derek Jack MD,  have reviewed the above documentation for accuracy and completeness, and I agree with the above.

## 2020-10-15 ENCOUNTER — Inpatient Hospital Stay (HOSPITAL_BASED_OUTPATIENT_CLINIC_OR_DEPARTMENT_OTHER): Payer: Medicare HMO | Admitting: Hematology

## 2020-10-15 ENCOUNTER — Other Ambulatory Visit: Payer: Self-pay

## 2020-10-15 ENCOUNTER — Inpatient Hospital Stay (HOSPITAL_COMMUNITY): Payer: Medicare HMO

## 2020-10-15 VITALS — BP 129/69 | HR 84 | Temp 98.1°F | Resp 18 | Wt 166.4 lb

## 2020-10-15 DIAGNOSIS — C9000 Multiple myeloma not having achieved remission: Secondary | ICD-10-CM | POA: Diagnosis not present

## 2020-10-15 DIAGNOSIS — C9001 Multiple myeloma in remission: Secondary | ICD-10-CM

## 2020-10-15 MED ORDER — SODIUM CHLORIDE 0.9 % IV SOLN: Freq: Once | INTRAVENOUS | Status: AC

## 2020-10-15 MED ORDER — ZOLEDRONIC ACID 4 MG/100ML IV SOLN
4.0000 mg | Freq: Once | INTRAVENOUS | Status: AC
  Administered 2020-10-15: 4 mg via INTRAVENOUS
  Filled 2020-10-15: qty 100

## 2020-10-15 MED ORDER — ZOLEDRONIC ACID 4 MG/5ML IV CONC: 4.0000 mg | Freq: Once | INTRAVENOUS | Status: DC

## 2020-10-15 NOTE — Progress Notes (Signed)
Patient has been assessed and labs reviewed by Dr. Delton Coombes. Patient okay to proceed with Zometa today per Dr. Delton Coombes. Primary RN and Pharmacy made aware.

## 2020-10-15 NOTE — Progress Notes (Signed)
Zometa infusion given per orders. Patient tolerated it well without problems. Vitals stable and discharged home from clinic ambulatory. Follow up as scheduled.

## 2020-10-15 NOTE — Patient Instructions (Signed)
[facility]  Discharge Instructions: Thank you for choosing [facility] to provide your oncology and hematology care.  If you have a lab appointment with the Cancer Center, please come in thru the Main Entrance and check in at the main information desk.    We strive to give you quality time with your provider. You may need to reschedule your appointment if you arrive late (15 or more minutes).  Arriving late affects you and other patients whose appointments are after yours.  Also, if you miss three or more appointments without notifying the office, you may be dismissed from the clinic at the provider's discretion.      For prescription refill requests, have your pharmacy contact our office and allow 72 hours for refills to be completed.       To help prevent nausea and vomiting after your treatment, we encourage you to take your nausea medication as directed.    Items with * indicate a potential emergency and should be followed up as soon as possible or go to the Emergency Department if any problems should occur.   Should you have questions after your visit or need to cancel or reschedule your appointment, please contact [facility] [phone]  and follow the prompts.  Office hours are 8:00 a.m. to 4:30 p.m. Monday - Friday. Please note that voicemails left after 4:00 p.m. may not be returned until the following business day.  We are closed weekends and major holidays. You have access to a nurse at all times for urgent questions. Please call the main number to the clinic [phone] and follow the prompts.  For any non-urgent questions, you may also contact your provider using MyChart. We now offer e-Visits for anyone 18 and older to request care online for non-urgent symptoms. For details visit [email or web address].   Also download the MyChart app! Go to the app store, search "MyChart", open the app, select [facility], and log in with your MyChart  username and password.  Due to Covid, a mask is required upon entering the hospital/clinic. If you do not have a mask, one will be given to you upon arrival. For doctor visits, patients may have 1 support person aged 18 or older with them. For treatment visits, patients cannot have anyone with them due to current Covid guidelines and our immunocompromised population.  

## 2020-10-15 NOTE — Patient Instructions (Addendum)
[place] at [facility] Discharge Instructions  You were seen today by Dr. Delton Coombes. He went over your recent results, and you received your treatment. Dr. Delton Coombes will see you back in 3 months for labs and follow up.   Thank you for choosing [place] at [facility] to provide your oncology and hematology care.  To afford each patient quality time with our provider, please arrive at least 15 minutes before your scheduled appointment time.   If you have a lab appointment with the [facility] please come in thru the Main Entrance and check in at the main information desk  You need to re-schedule your appointment should you arrive 10 or more minutes late.  We strive to give you quality time with our providers, and arriving late affects you and other patients whose appointments are after yours.  Also, if you no show three or more times for appointments you may be dismissed from the clinic at the providers discretion.     Again, thank you for choosing [facility]  Our hope is that these requests will decrease the amount of time that you wait before being seen by our physicians.       _____________________________________________________________  Should you have questions after your visit to [facility], please contact our office at [phone] between the hours of 8:00 a.m. and 4:30 p.m.  Voicemails left after 4:00 p.m. will not be returned until the following business day.  For prescription refill requests, have your pharmacy contact our office and allow 72 hours.    Cancer Center Support Programs:   > Cancer Support Group  2nd Tuesday of the month 1pm-2pm, Journey Room

## 2020-10-25 ENCOUNTER — Other Ambulatory Visit (HOSPITAL_COMMUNITY): Payer: Self-pay | Admitting: Hematology

## 2020-10-25 DIAGNOSIS — C9001 Multiple myeloma in remission: Secondary | ICD-10-CM

## 2020-10-29 ENCOUNTER — Encounter (HOSPITAL_COMMUNITY): Payer: Self-pay | Admitting: Hematology

## 2020-10-29 NOTE — Telephone Encounter (Signed)
Chart reviewed. Revlimid refilled per last office visit with Dr. Delton Coombes

## 2020-11-19 ENCOUNTER — Other Ambulatory Visit (HOSPITAL_COMMUNITY): Payer: Self-pay | Admitting: Hematology

## 2020-11-19 DIAGNOSIS — C9001 Multiple myeloma in remission: Secondary | ICD-10-CM

## 2020-11-24 ENCOUNTER — Encounter (HOSPITAL_COMMUNITY): Payer: Self-pay | Admitting: Hematology

## 2020-11-24 NOTE — Telephone Encounter (Signed)
Chart reviewed. Revlimid refilled per last office note with Dr. Katragadda.  

## 2020-12-12 ENCOUNTER — Other Ambulatory Visit (HOSPITAL_COMMUNITY): Payer: Self-pay | Admitting: Hematology

## 2020-12-12 DIAGNOSIS — C9001 Multiple myeloma in remission: Secondary | ICD-10-CM

## 2020-12-16 ENCOUNTER — Encounter (HOSPITAL_COMMUNITY): Payer: Self-pay | Admitting: Hematology

## 2020-12-16 NOTE — Telephone Encounter (Signed)
Chart reviewed. Revlimid refilled per last office note with Dr. Katragadda.  

## 2020-12-31 ENCOUNTER — Inpatient Hospital Stay (HOSPITAL_COMMUNITY): Payer: Medicare HMO | Attending: Hematology

## 2020-12-31 ENCOUNTER — Other Ambulatory Visit: Payer: Self-pay

## 2020-12-31 DIAGNOSIS — C9000 Multiple myeloma not having achieved remission: Secondary | ICD-10-CM | POA: Insufficient documentation

## 2020-12-31 DIAGNOSIS — C9001 Multiple myeloma in remission: Secondary | ICD-10-CM

## 2020-12-31 LAB — CBC WITH DIFFERENTIAL/PLATELET
Abs Immature Granulocytes: 0.01 10*3/uL (ref 0.00–0.07)
Basophils Absolute: 0 10*3/uL (ref 0.0–0.1)
Basophils Relative: 1 %
Eosinophils Absolute: 0.3 10*3/uL (ref 0.0–0.5)
Eosinophils Relative: 6 %
HCT: 36 % (ref 36.0–46.0)
Hemoglobin: 12.3 g/dL (ref 12.0–15.0)
Immature Granulocytes: 0 %
Lymphocytes Relative: 39 %
Lymphs Abs: 1.6 10*3/uL (ref 0.7–4.0)
MCH: 35.9 pg — ABNORMAL HIGH (ref 26.0–34.0)
MCHC: 34.2 g/dL (ref 30.0–36.0)
MCV: 105 fL — ABNORMAL HIGH (ref 80.0–100.0)
Monocytes Absolute: 0.8 10*3/uL (ref 0.1–1.0)
Monocytes Relative: 19 %
Neutro Abs: 1.4 10*3/uL — ABNORMAL LOW (ref 1.7–7.7)
Neutrophils Relative %: 35 %
Platelets: 195 10*3/uL (ref 150–400)
RBC: 3.43 MIL/uL — ABNORMAL LOW (ref 3.87–5.11)
RDW: 13.7 % (ref 11.5–15.5)
WBC: 4.1 10*3/uL (ref 4.0–10.5)
nRBC: 0 % (ref 0.0–0.2)

## 2020-12-31 LAB — COMPREHENSIVE METABOLIC PANEL
ALT: 17 U/L (ref 0–44)
AST: 15 U/L (ref 15–41)
Albumin: 3.9 g/dL (ref 3.5–5.0)
Alkaline Phosphatase: 51 U/L (ref 38–126)
Anion gap: 8 (ref 5–15)
BUN: 15 mg/dL (ref 8–23)
CO2: 25 mmol/L (ref 22–32)
Calcium: 9.5 mg/dL (ref 8.9–10.3)
Chloride: 102 mmol/L (ref 98–111)
Creatinine, Ser: 0.96 mg/dL (ref 0.44–1.00)
GFR, Estimated: >60 mL/min (ref >60)
Glucose, Bld: 100 mg/dL — ABNORMAL HIGH (ref 70–99)
Potassium: 3.6 mmol/L (ref 3.5–5.1)
Sodium: 135 mmol/L (ref 135–145)
Total Bilirubin: 0.9 mg/dL (ref 0.3–1.2)
Total Protein: 8.1 g/dL (ref 6.5–8.1)

## 2020-12-31 LAB — MAGNESIUM: Magnesium: 1.6 mg/dL — ABNORMAL LOW (ref 1.7–2.4)

## 2020-12-31 LAB — LACTATE DEHYDROGENASE: LDH: 102 U/L (ref 98–192)

## 2021-01-01 LAB — KAPPA/LAMBDA LIGHT CHAINS
Kappa free light chain: 47.8 mg/L — ABNORMAL HIGH (ref 3.3–19.4)
Kappa, lambda light chain ratio: 1.88 — ABNORMAL HIGH (ref 0.26–1.65)
Lambda free light chains: 25.4 mg/L (ref 5.7–26.3)

## 2021-01-04 LAB — IMMUNOFIXATION ELECTROPHORESIS
IgA: 907 mg/dL — ABNORMAL HIGH (ref 64–422)
IgG (Immunoglobin G), Serum: 1289 mg/dL (ref 586–1602)
IgM (Immunoglobulin M), Srm: 14 mg/dL — ABNORMAL LOW (ref 26–217)
Total Protein ELP: 7.3 g/dL (ref 6.0–8.5)

## 2021-01-04 LAB — PROTEIN ELECTROPHORESIS, SERUM
A/G Ratio: 1 (ref 0.7–1.7)
Albumin ELP: 3.7 g/dL (ref 2.9–4.4)
Alpha-1-Globulin: 0.2 g/dL (ref 0.0–0.4)
Alpha-2-Globulin: 0.8 g/dL (ref 0.4–1.0)
Beta Globulin: 1.5 g/dL — ABNORMAL HIGH (ref 0.7–1.3)
Gamma Globulin: 1.3 g/dL (ref 0.4–1.8)
Globulin, Total: 3.7 g/dL (ref 2.2–3.9)
Total Protein ELP: 7.4 g/dL (ref 6.0–8.5)

## 2021-01-06 NOTE — Progress Notes (Signed)
[place], [place] [address]   CLINIC:  Medical Oncology/Hematology  PCP:  Asencion Noble, MD [address] / [place] [address] [phone]   REASON FOR VISIT:  Follow-up for multiple myeloma  PRIOR THERAPY:  1. Radiation through 11/10/2017. 2. RVD x 5 cycles from 11/14/2017 to 02/19/2018. 3. Stem cell transplant on 04/03/2018.  NGS Results: not Ewing  CURRENT THERAPY: Maintenance Revlimid 10 mg 3/4 weeks  BRIEF ONCOLOGIC HISTORY:  Oncology History  Multiple myeloma ([place])  10/10/2017 Initial Diagnosis   Multiple myeloma ([place])   11/14/2017 - 02/26/2018 Chemotherapy   The patient had dexamethasone (DECADRON) tablet 40 mg, 40 mg (100 % of original dose 40 mg), Oral,  Once, 1 of 1 cycle Dose modification: 40 mg (original dose 40 mg, Cycle 1) Administration: 40 mg (11/14/2017) dexamethasone (DECADRON) 4 MG tablet, 1 of 1 cycle, Start date: 11/02/2017, End date: 03/12/2018 lenalidomide (REVLIMID) 25 MG capsule, 1 of 1 cycle, Start date: 02/28/2018, End date: 03/12/2018 bortezomib SQ (VELCADE) chemo injection 2.5 mg, 1.3 mg/m2 = 2.5 mg, Subcutaneous,  Once, 5 of 6 cycles Administration: 2.5 mg (11/14/2017), 2.5 mg (11/21/2017), 2.5 mg (11/28/2017), 2.5 mg (12/14/2017), 2.5 mg (12/21/2017), 2.5 mg (12/28/2017), 2.5 mg (01/04/2018), 2.5 mg (01/11/2018), 2.5 mg (01/18/2018), 2.5 mg (01/25/2018), 2.5 mg (02/01/2018), 2.5 mg (02/08/2018), 2.5 mg (02/19/2018), 2.5 mg (02/26/2018)   for chemotherapy treatment.       CANCER STAGING: Cancer Staging No matching staging information was found for the patient.  INTERVAL HISTORY:  Jamie Ewing, a 80 y.o. female, returns for routine follow-up of her multiple myeloma. Jamie Ewing was last seen on 10/15/2020.   Today she reports feeling good. She reports good appetite, and she also reports occasional loose stools. She reports mild dizziness upon standing. She denies jaw pain. She denies new pains and leg  swellings.   REVIEW OF SYSTEMS:  Review of Systems  Constitutional:  Negative for appetite change (80%) and fatigue (75%).  Cardiovascular:  Negative for leg swelling.  Gastrointestinal:  Positive for diarrhea.  Musculoskeletal:  Positive for arthralgias (4/10 joint and legs).  Neurological:  Positive for dizziness.  All other systems reviewed and are negative.  PAST MEDICAL/SURGICAL HISTORY:  Past Medical History:  Diagnosis Date   Back pain    Hypercholesteremia    Hypertension    Past Surgical History:  Procedure Laterality Date   BONE MARROW BIOPSY     LAMINECTOMY N/A 10/06/2017   Procedure: THORACIC NINE AND TEN LAMINECTOMY WITH RESECTION OF TUMOR, THORACIC EIGHT TO THORACIC ELEVEN FUSION WITH PEDICLE SCREW FIXATION;  Surgeon: Earnie Larsson, MD;  Location: [facility];  Service: Neurosurgery;  Laterality: N/A;   REPLACEMENT TOTAL KNEE Left 2003   TOTAL KNEE ARTHROPLASTY  2001    SOCIAL HISTORY:  Social History   Socioeconomic History   Marital status: Widowed    Spouse name: Not on file   Number of children: 2   Years of education: Not on file   Highest education level: Not on file  Occupational History   Not on file  Tobacco Use   Smoking status: Former    Packs/day: 0.50    Years: 30.00    Pack years: 15.00    Types: Cigarettes    Quit date: 05/31/2006    Years since quitting: 14.6   Smokeless tobacco: Never  Vaping Use   Vaping Use: Never used  Substance and Sexual Activity   Alcohol use: Yes  Comment: wine occassionally   Drug use: No   Sexual activity: Not on file  Other Topics Concern   Not on file  Social History Narrative   Not on file   Social Determinants of Health   Financial Resource Strain: Not on file  Food Insecurity: Not on file  Transportation Needs: Not on file  Physical Activity: Not on file  Stress: Not on file  Social Connections: Not on file  Intimate Partner Violence: Not on file    FAMILY HISTORY:  Family History  Problem  Relation Age of Onset   Tuberculosis Mother    Kidney failure Father    Hypertension Paternal Aunt    Diabetes Paternal Aunt    Hypertension Paternal Uncle    Diabetes Paternal Uncle    Hypertension Daughter    Stroke Daughter     CURRENT MEDICATIONS:  Current Outpatient Medications  Medication Sig Dispense Refill   acetaminophen (TYLENOL) 500 MG tablet Take by mouth.      amLODipine (NORVASC) 5 MG tablet Take 5 mg by mouth daily.      calcium carbonate (OSCAL) 1500 (600 Ca) MG TABS tablet Take by mouth.     hydrochlorothiazide (HYDRODIURIL) 25 MG tablet Take 25 mg by mouth daily.     lenalidomide (REVLIMID) 10 MG capsule TAKE 1 CAPSULE BY MOUTH EVERY DAY FOR 21 DAYS THEN OFF FOR 7 DAYS 21 capsule 0   LOW-DOSE ASPIRIN PO Asprin Ec Low Dose     Multiple Vitamins-Minerals (THERA-M) TABS Thera-M 9 mg iron-400 mcg tablet     polyethylene glycol (MIRALAX / GLYCOLAX) packet Take 17 g by mouth as needed.      potassium chloride SA (KLOR-CON) 20 MEQ tablet TAKE 1 TABLET TWICE DAILY 180 tablet 2   vitamin B-12 (CYANOCOBALAMIN) 1000 MCG tablet Vitamin B12     No current facility-administered medications for this visit.    ALLERGIES:  No Known Allergies  PHYSICAL EXAM:  Performance status (ECOG): 1 - Symptomatic but completely ambulatory  There were no vitals filed for this visit. Wt Readings from Last 3 Encounters:  10/15/20 166 lb 6.4 oz (75.5 kg)  07/23/20 172 lb 12.8 oz (78.4 kg)  04/30/20 169 lb 12.8 oz (77 kg)   Physical Exam Vitals reviewed.  Constitutional:      Appearance: Normal appearance.  Cardiovascular:     Rate and Rhythm: Normal rate and regular rhythm.     Pulses: Normal pulses.     Heart sounds: Normal heart sounds.  Pulmonary:     Effort: Pulmonary effort is normal.     Breath sounds: Normal breath sounds.  Musculoskeletal:     Right lower leg: No edema.     Left lower leg: No edema.  Neurological:     General: No focal deficit present.     Mental  Status: She is alert and oriented to person, place, and time.  Psychiatric:        Mood and Affect: Mood normal.        Behavior: Behavior normal.     LABORATORY DATA:  I have reviewed the labs as listed.  CBC Latest Ref Rng & Units 12/31/2020 10/08/2020 07/17/2020  WBC 4.0 - 10.5 K/uL 4.1 3.7(L) 3.7(L)  Hemoglobin 12.0 - 15.0 g/dL 12.3 12.7 12.6  Hematocrit 36.0 - 46.0 % 36.0 37.5 38.4  Platelets 150 - 400 K/uL 195 177 199   CMP Latest Ref Rng & Units 12/31/2020 10/08/2020 07/17/2020  Glucose 70 - 99 mg/dL 100(H) 111(H)  114(H)  BUN 8 - 23 mg/dL _0 Creatinine 0.44 - 1.00 mg/dL 0.96 1.07(H) 1.00  Sodium 135 - 145 mmol/L 135 132(L) 135  Potassium 3.5 - 5.1 mmol/L 3.6 3.6 3.2(L)  Chloride 98 - 111 mmol/L 102 99 101  CO2 22 - 32 mmol/L _1 Calcium 8.9 - 10.3 mg/dL 9.5 9.4 9.3  Total Protein 6.5 - 8.1 g/dL 8.1 8.1 7.9  Total Bilirubin 0.3 - 1.2 mg/dL 0.9 0.7 0.6  Alkaline Phos 38 - 126 U/L 51 46 55  AST 15 - 41 U/L _2 ALT 0 - 44 U/L _3 DIAGNOSTIC IMAGING:  I have independently reviewed the scans and discussed with the patient. No results found.   ASSESSMENT:  1.  Stage II IgA kappa multiple myeloma, standard risk: -XRT to the spine completed on November 10, 2017 following T9-T10 laminectomy with resection of epidural tumor. -5 cycles of RVD from 11/14/2017 through 02/19/2018 followed by stem cell transplant on April 03, 2018. -Revlimid maintenance therapy 10 mg 3 weeks on/1 week off started on August 24, 2018. -PET scan on 09/02/2019 showed redemonstrated bilateral cervical, right supraclavicular, right axillary hypermetabolic adenopathy with interval resolution of left axillary adenopathy.  Similar lytic and sclerotic lesions in bones with no corresponding FDG uptake.  Persistent increased FDG uptake in both palatine tonsils. -Reviewed myeloma panel from 09/26/2019.  SPEP and immunofixation is negative.  Free light chain ratio is normal with kappa light chains  elevated at 32.5. -Right neck lymph node biopsy on 10/21/2019 at [facility] was negative for malignancy.   2.  Myeloma bone disease: -Zometa was started on 11/21/2017, resumed after transplant on 09/06/2018.   PLAN:  1.  Stage II IgA kappa multiple myeloma, standard risk: - We reviewed myeloma panel from 12/31/2020.  M spike was negative. - Free light chain ratio is 1.88 with kappa light chains 47 and lambda light chains 25.  Immunofixation was normal. - CBC shows normal white count with ANC of 1.4.  Hemoglobin and platelet count was normal.  LFTs were normal. - She reports bowel movement after eating occasionally.  Denies any infections.  No fevers, night sweats.  She lost about 3 pounds since last visit.  No new pains reported. - Continue Revlimid 10 mg 3 weeks on/1 week off. - She has a follow-up at [facility] in January 2023. - RTC 3 months for follow-up with repeat labs.   2.  Myeloma bone disease: - DEXA scan on 07/17/2020 was normal. - Calcium is 9.5. - Continue Zometa today and every 3 months.   3.  Thromboprophylaxis: - Continue aspirin 81 mg daily.   4.  Hypertension: - Blood pressure today is 134/70.  Continue amlodipine and HCTZ.   5.  Peripheral neuropathy: -She has mild tingling and numbness in the hands and feet which has been stable. - No pharmacological intervention necessary.   Orders placed this encounter:  No orders of the defined types were placed in this encounter.    Derek Jack, MD [place] [phone]   I, Thana Ates, am acting as a scribe for Dr. Derek Jack.  I, Derek Jack MD, have reviewed the above documentation for accuracy and completeness, and I agree with the above.

## 2021-01-07 ENCOUNTER — Other Ambulatory Visit: Payer: Self-pay

## 2021-01-07 ENCOUNTER — Other Ambulatory Visit (HOSPITAL_COMMUNITY): Payer: Self-pay | Admitting: Hematology

## 2021-01-07 ENCOUNTER — Inpatient Hospital Stay (HOSPITAL_COMMUNITY): Payer: Medicare HMO

## 2021-01-07 ENCOUNTER — Inpatient Hospital Stay (HOSPITAL_BASED_OUTPATIENT_CLINIC_OR_DEPARTMENT_OTHER): Payer: Medicare HMO | Admitting: Hematology

## 2021-01-07 VITALS — BP 134/70 | HR 76 | Temp 96.9°F | Resp 18 | Wt 163.8 lb

## 2021-01-07 DIAGNOSIS — C9000 Multiple myeloma not having achieved remission: Secondary | ICD-10-CM

## 2021-01-07 DIAGNOSIS — C9001 Multiple myeloma in remission: Secondary | ICD-10-CM

## 2021-01-07 MED ORDER — SODIUM CHLORIDE 0.9 % IV SOLN: Freq: Once | INTRAVENOUS | Status: AC

## 2021-01-07 MED ORDER — ZOLEDRONIC ACID 4 MG/100ML IV SOLN
4.0000 mg | Freq: Once | INTRAVENOUS | Status: AC
  Administered 2021-01-07: 4 mg via INTRAVENOUS
  Filled 2021-01-07: qty 100

## 2021-01-07 MED ORDER — ZOLEDRONIC ACID 4 MG/5ML IV CONC: 4.0000 mg | Freq: Once | INTRAVENOUS | Status: DC

## 2021-01-07 NOTE — Patient Instructions (Signed)
[place]  Discharge Instructions: Thank you for choosing [facility] to provide your oncology and hematology care.  If you have a lab appointment with the [facility], please come in thru the Main Entrance and check in at the main information desk.  Wear comfortable clothing and clothing appropriate for easy access to any Portacath or PICC line.   We strive to give you quality time with your provider. You may need to reschedule your appointment if you arrive late (15 or more minutes).  Arriving late affects you and other patients whose appointments are after yours.  Also, if you miss three or more appointments without notifying the office, you may be dismissed from the clinic at the provider's discretion.      For prescription refill requests, have your pharmacy contact our office and allow 72 hours for refills to be completed.    Today you received the Zometa        BELOW ARE SYMPTOMS THAT SHOULD BE REPORTED IMMEDIATELY: *FEVER GREATER THAN 100.4 F (38 C) OR HIGHER *CHILLS OR SWEATING *NAUSEA AND VOMITING THAT IS NOT CONTROLLED WITH YOUR NAUSEA MEDICATION *UNUSUAL SHORTNESS OF BREATH *UNUSUAL BRUISING OR BLEEDING *URINARY PROBLEMS (pain or burning when urinating, or frequent urination) *BOWEL PROBLEMS (unusual diarrhea, constipation, pain near the anus) TENDERNESS IN MOUTH AND THROAT WITH OR WITHOUT PRESENCE OF ULCERS (sore throat, sores in mouth, or a toothache) UNUSUAL RASH, SWELLING OR PAIN  UNUSUAL VAGINAL DISCHARGE OR ITCHING   Items with * indicate a potential emergency and should be followed up as soon as possible or go to the Emergency Department if any problems should occur.  Please show the CHEMOTHERAPY ALERT CARD or IMMUNOTHERAPY ALERT CARD at check-in to the Emergency Department and triage nurse.  Should you have questions after your visit or need to cancel or reschedule your appointment, please contact [facility] [phone]   and follow the prompts.  Office hours are 8:00 a.m. to 4:30 p.m. Monday - Friday. Please note that voicemails left after 4:00 p.m. may not be returned until the following business day.  We are closed weekends and major holidays. You have access to a nurse at all times for urgent questions. Please call the main number to the clinic [phone] and follow the prompts.  For any non-urgent questions, you may also contact your provider using MyChart. We now offer e-Visits for anyone 61 and older to request care online for non-urgent symptoms. For details visit [email or web address].   Also download the MyChart app! Go to the app store, search "MyChart", open the app, select [facility], and log in with your MyChart username and password.  Due to Covid, a mask is required upon entering the hospital/clinic. If you do not have a mask, one will be given to you upon arrival. For doctor visits, patients may have 1 support person aged 76 or older with them. For treatment visits, patients cannot have anyone with them due to current Covid guidelines and our immunocompromised population.

## 2021-01-07 NOTE — Progress Notes (Signed)
Pt presents today for Zometa IV per provider's order. Vital signs stable and labs WNL for treatment. Okay to proceed with treatment.   Zometa given today per MD orders. Tolerated infusion without adverse affects. Vital signs stable. No complaints at this time. Discharged from clinic ambulatory via cane in stable condition. Alert and oriented x 3. F/U with [facility] as scheduled.

## 2021-01-07 NOTE — Progress Notes (Signed)
Patient is taking Revlimid as prescribed.  She has not missed any doses and reports no side effects at this time.   

## 2021-01-07 NOTE — Patient Instructions (Signed)
[place] at [facility] Discharge Instructions  You were seen and examined today by Dr. Delton Coombes. You will receive Zometa infusion today and in 3 months. Return as scheduled in 3 months for lab work, office visit, and Zometa.    Thank you for choosing [place] at [facility] to provide your oncology and hematology care.  To afford each patient quality time with our provider, please arrive at least 15 minutes before your scheduled appointment time.   If you have a lab appointment with the [facility] please come in thru the Main Entrance and check in at the main information desk.  You need to re-schedule your appointment should you arrive 10 or more minutes late.  We strive to give you quality time with our providers, and arriving late affects you and other patients whose appointments are after yours.  Also, if you no show three or more times for appointments you may be dismissed from the clinic at the providers discretion.     Again, thank you for choosing [facility].  Our hope is that these requests will decrease the amount of time that you wait before being seen by our physicians.       _____________________________________________________________  Should you have questions after your visit to [facility], please contact our office at [phone] and follow the prompts.  Our office hours are 8:00 a.m. and 4:30 p.m. Monday - Friday.  Please note that voicemails left after 4:00 p.m. may not be returned until the following business day.  We are closed weekends and major holidays.  You do have access to a nurse 24-7, just call the main number to the clinic [phone] and do not press any options, hold on the line and a nurse will answer the phone.    For prescription refill requests, have your pharmacy contact our office and allow 72 hours.    Due to Covid, you will need to wear a mask upon entering the hospital.  If you do not have a mask, a mask will be given to you at the Main Entrance upon arrival. For doctor visits, patients may have 1 support person age 68 or older with them. For treatment visits, patients can not have anyone with them due to social distancing guidelines and our immunocompromised population.

## 2021-01-11 ENCOUNTER — Encounter (HOSPITAL_COMMUNITY): Payer: Self-pay | Admitting: Hematology

## 2021-01-11 DIAGNOSIS — Z1231 Encounter for screening mammogram for malignant neoplasm of breast: Secondary | ICD-10-CM | POA: Diagnosis not present

## 2021-01-11 DIAGNOSIS — Z6828 Body mass index (BMI) 28.0-28.9, adult: Secondary | ICD-10-CM | POA: Diagnosis not present

## 2021-01-11 DIAGNOSIS — Z124 Encounter for screening for malignant neoplasm of cervix: Secondary | ICD-10-CM | POA: Diagnosis not present

## 2021-01-11 NOTE — Telephone Encounter (Signed)
Chart reviewed. Revlimid refilled per last office note with Dr. Katragadda.  

## 2021-01-29 ENCOUNTER — Other Ambulatory Visit (HOSPITAL_COMMUNITY): Payer: Self-pay | Admitting: Hematology

## 2021-01-29 DIAGNOSIS — C9001 Multiple myeloma in remission: Secondary | ICD-10-CM

## 2021-02-02 ENCOUNTER — Other Ambulatory Visit (HOSPITAL_COMMUNITY): Payer: Self-pay

## 2021-02-02 DIAGNOSIS — C9001 Multiple myeloma in remission: Secondary | ICD-10-CM

## 2021-02-02 MED ORDER — LENALIDOMIDE 10 MG PO CAPS
ORAL_CAPSULE | ORAL | 0 refills | Status: DC
Start: 2021-02-02 — End: 2021-03-01

## 2021-02-02 NOTE — Telephone Encounter (Signed)
Chart reviewed. Revlimid refilled per last office note with Dr. Katragadda.  

## 2021-02-08 ENCOUNTER — Encounter (HOSPITAL_COMMUNITY): Payer: Self-pay | Admitting: Hematology

## 2021-02-25 ENCOUNTER — Other Ambulatory Visit (HOSPITAL_COMMUNITY): Payer: Self-pay | Admitting: Hematology

## 2021-02-25 DIAGNOSIS — C9001 Multiple myeloma in remission: Secondary | ICD-10-CM

## 2021-03-01 ENCOUNTER — Other Ambulatory Visit (HOSPITAL_COMMUNITY): Payer: Self-pay

## 2021-03-01 DIAGNOSIS — C9001 Multiple myeloma in remission: Secondary | ICD-10-CM

## 2021-03-01 MED ORDER — LENALIDOMIDE 10 MG PO CAPS: ORAL_CAPSULE | ORAL | 0 refills | Status: DC

## 2021-03-01 NOTE — Telephone Encounter (Signed)
Chart reviewed. Revlimid refilled per last office note with Dr. Katragadda.  

## 2021-03-02 ENCOUNTER — Encounter (HOSPITAL_COMMUNITY): Payer: Self-pay | Admitting: Hematology

## 2021-03-04 ENCOUNTER — Other Ambulatory Visit (HOSPITAL_COMMUNITY): Payer: Self-pay

## 2021-03-04 DIAGNOSIS — C9001 Multiple myeloma in remission: Secondary | ICD-10-CM

## 2021-03-04 MED ORDER — LENALIDOMIDE 10 MG PO CAPS: ORAL_CAPSULE | ORAL | 0 refills | Status: DC

## 2021-03-25 ENCOUNTER — Inpatient Hospital Stay (HOSPITAL_COMMUNITY): Payer: Medicare HMO | Attending: Hematology

## 2021-03-25 ENCOUNTER — Other Ambulatory Visit: Payer: Self-pay

## 2021-03-25 DIAGNOSIS — C9001 Multiple myeloma in remission: Secondary | ICD-10-CM

## 2021-03-25 DIAGNOSIS — C9000 Multiple myeloma not having achieved remission: Secondary | ICD-10-CM | POA: Insufficient documentation

## 2021-03-25 LAB — COMPREHENSIVE METABOLIC PANEL
ALT: 18 U/L (ref 0–44)
AST: 19 U/L (ref 15–41)
Albumin: 4 g/dL (ref 3.5–5.0)
Alkaline Phosphatase: 49 U/L (ref 38–126)
Anion gap: 12 (ref 5–15)
BUN: 19 mg/dL (ref 8–23)
CO2: 24 mmol/L (ref 22–32)
Calcium: 9.9 mg/dL (ref 8.9–10.3)
Chloride: 100 mmol/L (ref 98–111)
Creatinine, Ser: 0.93 mg/dL (ref 0.44–1.00)
GFR, Estimated: >60 mL/min (ref >60)
Glucose, Bld: 107 mg/dL — ABNORMAL HIGH (ref 70–99)
Potassium: 3.6 mmol/L (ref 3.5–5.1)
Sodium: 136 mmol/L (ref 135–145)
Total Bilirubin: 0.8 mg/dL (ref 0.3–1.2)
Total Protein: 7.9 g/dL (ref 6.5–8.1)

## 2021-03-25 LAB — CBC WITH DIFFERENTIAL/PLATELET
Abs Immature Granulocytes: 0.02 10*3/uL (ref 0.00–0.07)
Basophils Absolute: 0 10*3/uL (ref 0.0–0.1)
Basophils Relative: 0 %
Eosinophils Absolute: 0.1 10*3/uL (ref 0.0–0.5)
Eosinophils Relative: 3 %
HCT: 38.2 % (ref 36.0–46.0)
Hemoglobin: 12.5 g/dL (ref 12.0–15.0)
Immature Granulocytes: 0 %
Lymphocytes Relative: 37 %
Lymphs Abs: 1.6 10*3/uL (ref 0.7–4.0)
MCH: 34.3 pg — ABNORMAL HIGH (ref 26.0–34.0)
MCHC: 32.7 g/dL (ref 30.0–36.0)
MCV: 104.9 fL — ABNORMAL HIGH (ref 80.0–100.0)
Monocytes Absolute: 0.8 10*3/uL (ref 0.1–1.0)
Monocytes Relative: 18 %
Neutro Abs: 1.9 10*3/uL (ref 1.7–7.7)
Neutrophils Relative %: 42 %
Platelets: 204 10*3/uL (ref 150–400)
RBC: 3.64 MIL/uL — ABNORMAL LOW (ref 3.87–5.11)
RDW: 13.3 % (ref 11.5–15.5)
WBC: 4.5 10*3/uL (ref 4.0–10.5)
nRBC: 0 % (ref 0.0–0.2)

## 2021-03-25 LAB — MAGNESIUM: Magnesium: 1.7 mg/dL (ref 1.7–2.4)

## 2021-03-25 LAB — LACTATE DEHYDROGENASE: LDH: 103 U/L (ref 98–192)

## 2021-03-26 LAB — KAPPA/LAMBDA LIGHT CHAINS
Kappa free light chain: 49.3 mg/L — ABNORMAL HIGH (ref 3.3–19.4)
Kappa, lambda light chain ratio: 1.65 (ref 0.26–1.65)
Lambda free light chains: 29.8 mg/L — ABNORMAL HIGH (ref 5.7–26.3)

## 2021-03-27 ENCOUNTER — Other Ambulatory Visit (HOSPITAL_COMMUNITY): Payer: Self-pay | Admitting: Hematology

## 2021-03-27 DIAGNOSIS — C9001 Multiple myeloma in remission: Secondary | ICD-10-CM

## 2021-03-29 LAB — PROTEIN ELECTROPHORESIS, SERUM
A/G Ratio: 0.9 (ref 0.7–1.7)
Albumin ELP: 3.8 g/dL (ref 2.9–4.4)
Alpha-1-Globulin: 0.2 g/dL (ref 0.0–0.4)
Alpha-2-Globulin: 0.8 g/dL (ref 0.4–1.0)
Beta Globulin: 1.4 g/dL — ABNORMAL HIGH (ref 0.7–1.3)
Gamma Globulin: 1.7 g/dL (ref 0.4–1.8)
Globulin, Total: 4.1 g/dL — ABNORMAL HIGH (ref 2.2–3.9)
Total Protein ELP: 7.9 g/dL (ref 6.0–8.5)

## 2021-03-31 ENCOUNTER — Encounter (HOSPITAL_COMMUNITY): Payer: Self-pay | Admitting: Hematology

## 2021-03-31 LAB — IMMUNOFIXATION ELECTROPHORESIS
IgA: 1010 mg/dL — ABNORMAL HIGH (ref 64–422)
IgG (Immunoglobin G), Serum: 1388 mg/dL (ref 586–1602)
IgM (Immunoglobulin M), Srm: 16 mg/dL — ABNORMAL LOW (ref 26–217)
Total Protein ELP: 7.8 g/dL (ref 6.0–8.5)

## 2021-03-31 NOTE — Telephone Encounter (Signed)
Chart reviewed. Revlimid refilled per last office note with Dr. Katragadda.  

## 2021-04-01 ENCOUNTER — Inpatient Hospital Stay (HOSPITAL_COMMUNITY): Payer: Medicare HMO

## 2021-04-01 ENCOUNTER — Inpatient Hospital Stay (HOSPITAL_BASED_OUTPATIENT_CLINIC_OR_DEPARTMENT_OTHER): Payer: Medicare HMO | Admitting: Hematology

## 2021-04-01 ENCOUNTER — Other Ambulatory Visit: Payer: Self-pay

## 2021-04-01 VITALS — BP 137/61 | HR 76 | Temp 98.0°F | Resp 18 | Ht 63.0 in | Wt 161.2 lb

## 2021-04-01 VITALS — BP 121/54 | HR 78 | Temp 99.5°F | Resp 18

## 2021-04-01 DIAGNOSIS — C9000 Multiple myeloma not having achieved remission: Secondary | ICD-10-CM | POA: Diagnosis not present

## 2021-04-01 DIAGNOSIS — C9001 Multiple myeloma in remission: Secondary | ICD-10-CM

## 2021-04-01 MED ORDER — SODIUM CHLORIDE 0.9 % IV SOLN: Freq: Once | INTRAVENOUS | Status: AC

## 2021-04-01 MED ORDER — ZOLEDRONIC ACID 4 MG/5ML IV CONC: 4.0000 mg | Freq: Once | INTRAVENOUS | Status: DC

## 2021-04-01 MED ORDER — ZOLEDRONIC ACID 4 MG/100ML IV SOLN
4.0000 mg | Freq: Once | INTRAVENOUS | Status: AC
  Administered 2021-04-01: 4 mg via INTRAVENOUS
  Filled 2021-04-01: qty 100

## 2021-04-01 NOTE — Progress Notes (Signed)
Pt presents today for Zometa per provider's order. Vitals and labs WNL okay for treatment today.Okay to proceed with treatment per Dr.K.  Zometa given today per MD orders. Tolerated infusion without adverse affects. Vital signs stable. No complaints at this time. Discharged from clinic ambulatory with cane in stable condition. Alert and oriented x 3. F/U with [facility] as scheduled.

## 2021-04-01 NOTE — Progress Notes (Signed)
Patient is taking Revlimid as prescribed.  She has not missed any doses and reports no side effects at this time.   

## 2021-04-01 NOTE — Progress Notes (Signed)
[name], [name]   CLINIC:  Medical Oncology/Hematology  PCP:  Asencion Noble, MD [address] / [place] [address] [phone]   REASON FOR VISIT:  Follow-up for multiple myeloma  PRIOR THERAPY:  1. Radiation through 11/10/2017. 2. RVD x 5 cycles from 11/14/2017 to 02/19/2018. 3. Stem cell transplant on 04/03/2018.   NGS Results: not done  CURRENT THERAPY: Maintenance Revlimid 10 mg 3/4 weeks  BRIEF ONCOLOGIC HISTORY:  Oncology History  Multiple myeloma ([facility])  10/10/2017 Initial Diagnosis   Multiple myeloma ([facility])   11/14/2017 - 02/26/2018 Chemotherapy   The patient had dexamethasone (DECADRON) tablet 40 mg, 40 mg (100 % of original dose 40 mg), Oral,  Once, 1 of 1 cycle Dose modification: 40 mg (original dose 40 mg, Cycle 1) Administration: 40 mg (11/14/2017) dexamethasone (DECADRON) 4 MG tablet, 1 of 1 cycle, Start date: 11/02/2017, End date: 03/12/2018 lenalidomide (REVLIMID) 25 MG capsule, 1 of 1 cycle, Start date: 02/28/2018, End date: 03/12/2018 bortezomib SQ (VELCADE) chemo injection 2.5 mg, 1.3 mg/m2 = 2.5 mg, Subcutaneous,  Once, 5 of 6 cycles Administration: 2.5 mg (11/14/2017), 2.5 mg (11/21/2017), 2.5 mg (11/28/2017), 2.5 mg (12/14/2017), 2.5 mg (12/21/2017), 2.5 mg (12/28/2017), 2.5 mg (01/04/2018), 2.5 mg (01/11/2018), 2.5 mg (01/18/2018), 2.5 mg (01/25/2018), 2.5 mg (02/01/2018), 2.5 mg (02/08/2018), 2.5 mg (02/19/2018), 2.5 mg (02/26/2018)   for chemotherapy treatment.       CANCER STAGING:  Cancer Staging  No matching staging information was found for the patient.  INTERVAL HISTORY:  Jamie Ewing, a 81 y.o. female, returns for routine follow-up of Jamie Ewing multiple myeloma. Jamie Ewing was last seen on 01/07/2021.   Today Jamie Ewing reports feeling good. Jamie Ewing denies n/v/d. Jamie Ewing denies dental issues and jaw pain. Jamie Ewing numbness in Jamie Ewing hands is stable. Jamie Ewing has lost 2 lbs since 01/07/21. Jamie Ewing reports increasing Jamie Ewing daily  exercise in November 2022. Jamie Ewing denies any infections within the last 3 months. Jamie Ewing denies ankle swellings and abdominal pain.  REVIEW OF SYSTEMS:  Review of Systems  Constitutional:  Negative for appetite change, fatigue and unexpected weight change.  Cardiovascular:  Negative for leg swelling.  Gastrointestinal:  Positive for diarrhea. Negative for abdominal pain.  Musculoskeletal:  Positive for arthralgias (4/10 legs).  Neurological:  Positive for dizziness and numbness.  All other systems reviewed and are negative.  PAST MEDICAL/SURGICAL HISTORY:  Past Medical History:  Diagnosis Date   Back pain    Hypercholesteremia    Hypertension    Past Surgical History:  Procedure Laterality Date   BONE MARROW BIOPSY     LAMINECTOMY N/A 10/06/2017   Procedure: THORACIC NINE AND TEN LAMINECTOMY WITH RESECTION OF TUMOR, THORACIC EIGHT TO THORACIC ELEVEN FUSION WITH PEDICLE SCREW FIXATION;  Surgeon: Earnie Larsson, MD;  Location: [facility];  Service: Neurosurgery;  Laterality: N/A;   REPLACEMENT TOTAL KNEE Left 2003   TOTAL KNEE ARTHROPLASTY  2001    SOCIAL HISTORY:  Social History   Socioeconomic History   Marital status: Widowed    Spouse name: Not on file   Number of children: 2   Years of education: Not on file   Highest education level: Not on file  Occupational History   Not on file  Tobacco Use   Smoking status: Former    Packs/day: 0.50    Years: 30.00    Pack years: 15.00    Types: Cigarettes    Quit date: 05/31/2006    Years since  quitting: 14.8   Smokeless tobacco: Never  Vaping Use   Vaping Use: Never used  Substance and Sexual Activity   Alcohol use: Yes    Comment: wine occassionally   Drug use: No   Sexual activity: Not on file  Other Topics Concern   Not on file  Social History Narrative   Not on file   Social Determinants of Health   Financial Resource Strain: Not on file  Food Insecurity: Not on file  Transportation Needs: Not on file  Physical Activity:  Not on file  Stress: Not on file  Social Connections: Not on file  Intimate Partner Violence: Not on file    FAMILY HISTORY:  Family History  Problem Relation Age of Onset   Tuberculosis Mother    Kidney failure Father    Hypertension Paternal Aunt    Diabetes Paternal Aunt    Hypertension Paternal Uncle    Diabetes Paternal Uncle    Hypertension Daughter    Stroke Daughter     CURRENT MEDICATIONS:  Current Outpatient Medications  Medication Sig Dispense Refill   acetaminophen (TYLENOL) 500 MG tablet Take by mouth.      amLODipine (NORVASC) 5 MG tablet Take 5 mg by mouth daily.      calcium carbonate (OSCAL) 1500 (600 Ca) MG TABS tablet Take by mouth.     hydrochlorothiazide (HYDRODIURIL) 25 MG tablet Take 25 mg by mouth daily.     lenalidomide (REVLIMID) 10 MG capsule TAKE 1 CAPSULE BY MOUTH EVERY DAY FOR 21 DAYS ON THEN 7 DAYS OFF 21 capsule 0   LOW-DOSE ASPIRIN PO Asprin Ec Low Dose     Multiple Vitamins-Minerals (THERA-M) TABS Thera-M 9 mg iron-400 mcg tablet     polyethylene glycol (MIRALAX / GLYCOLAX) packet Take 17 g by mouth as needed.      potassium chloride SA (KLOR-CON) 20 MEQ tablet TAKE 1 TABLET TWICE DAILY 180 tablet 2   vitamin B-12 (CYANOCOBALAMIN) 1000 MCG tablet Vitamin B12     No current facility-administered medications for this visit.    ALLERGIES:  No Known Allergies  PHYSICAL EXAM:  Performance status (ECOG): 1 - Symptomatic but completely ambulatory  Vitals:   04/01/21 1332  BP: 137/61  Pulse: 76  Resp: 18  Temp: 98 F (36.7 C)  SpO2: 100%   Wt Readings from Last 3 Encounters:  04/01/21 161 lb 3.2 oz (73.1 kg)  01/07/21 163 lb 12.8 oz (74.3 kg)  10/15/20 166 lb 6.4 oz (75.5 kg)   Physical Exam Vitals reviewed.  Constitutional:      Appearance: Normal appearance.  Cardiovascular:     Rate and Rhythm: Normal rate and regular rhythm.     Pulses: Normal pulses.     Heart sounds: Normal heart sounds.  Pulmonary:     Effort: Pulmonary  effort is normal.     Breath sounds: Normal breath sounds.  Neurological:     General: No focal deficit present.     Mental Status: Jamie Ewing is alert and oriented to person, place, and time.  Psychiatric:        Mood and Affect: Mood normal.        Behavior: Behavior normal.     LABORATORY DATA:  I have reviewed the labs as listed.  CBC Latest Ref Rng & Units 03/25/2021 12/31/2020 10/08/2020  WBC 4.0 - 10.5 K/uL 4.5 4.1 3.7(L)  Hemoglobin 12.0 - 15.0 g/dL 12.5 12.3 12.7  Hematocrit 36.0 - 46.0 % 38.2 36.0 37.5  Platelets  150 - 400 K/uL 204 195 177   CMP Latest Ref Rng & Units 03/25/2021 12/31/2020 10/08/2020  Glucose 70 - 99 mg/dL 107(H) 100(H) 111(H)  BUN 8 - 23 mg/dL $Remove'19 15 16  'DTAqqXy$ Creatinine 0.44 - 1.00 mg/dL 0.93 0.96 1.07(H)  Sodium 135 - 145 mmol/L 136 135 132(L)  Potassium 3.5 - 5.1 mmol/L 3.6 3.6 3.6  Chloride 98 - 111 mmol/L 100 102 99  CO2 22 - 32 mmol/L $RemoveB'24 25 23  'wCVcAUSR$ Calcium 8.9 - 10.3 mg/dL 9.9 9.5 9.4  Total Protein 6.5 - 8.1 g/dL 7.9 8.1 8.1  Total Bilirubin 0.3 - 1.2 mg/dL 0.8 0.9 0.7  Alkaline Phos 38 - 126 U/L 49 51 46  AST 15 - 41 U/L $Remo'19 15 20  'dFSst$ ALT 0 - 44 U/L $Remo'18 17 18    'ANDvB$ DIAGNOSTIC IMAGING:  I have independently reviewed the scans and discussed with the patient. No results found.   ASSESSMENT:  1.  Stage II IgA kappa multiple myeloma, standard risk: -XRT to the spine completed on November 10, 2017 following T9-T10 laminectomy with resection of epidural tumor. -5 cycles of RVD from 11/14/2017 through 02/19/2018 followed by stem cell transplant on April 03, 2018. -Revlimid maintenance therapy 10 mg 3 weeks on/1 week off started on August 24, 2018. -PET scan on 09/02/2019 showed redemonstrated bilateral cervical, right supraclavicular, right axillary hypermetabolic adenopathy with interval resolution of left axillary adenopathy.  Similar lytic and sclerotic lesions in bones with no corresponding FDG uptake.  Persistent increased FDG uptake in both palatine tonsils. -Reviewed myeloma  panel from 09/26/2019.  SPEP and immunofixation is negative.  Free light chain ratio is normal with kappa light chains elevated at 32.5. -Right neck lymph node biopsy on 10/21/2019 at [facility] was negative for malignancy.   2.  Myeloma bone disease: -Zometa was started on 11/21/2017, resumed after transplant on 09/06/2018.   PLAN:  1.  Stage II IgA kappa multiple myeloma, standard risk: - We reviewed myeloma panel from 03/25/2021.  M spike is not observed.  Immunofixation is normal.  Free light chain ratio is 1.65.  Kappa light chains are stable at 49. - CBC was grossly normal.  LFTs and creatinine and calcium were normal. - Continue Revlimid 10 mg 3 weeks on/1 week off maintenance.  Jamie Ewing is tolerating it very well. - He lost about couple of pounds but is exercising. - Continue Revlimid.  RTC 3 months with repeat myeloma labs 1 week prior.   2.  Myeloma bone disease: - DEXA scan on 07/17/2020 was normal. - Calcium is normal today.  Continue Zometa today and every 3 months.   3.  Thromboprophylaxis: - Continue aspirin 81 mg daily.   4.  Hypertension: - Continue amlodipine and HCTZ.  Blood pressure is 137/61.   5.  Peripheral neuropathy: -Mild tingling and numbness in the hands and feet has been stable.   Orders placed this encounter:  No orders of the defined types were placed in this encounter.    Derek Jack, MD [facility] [phone]   I, Thana Ates, am acting as a scribe for Dr. Derek Jack.  I, Derek Jack MD, have reviewed the above documentation for accuracy and completeness, and I agree with the above.

## 2021-04-01 NOTE — Patient Instructions (Signed)
[facility] [place]  Discharge Instructions: Thank you for choosing [facility] to provide your oncology and hematology care.  If you have a lab appointment with the [facility], please come in thru the Main Entrance and check in at the main information desk.  Wear comfortable clothing and clothing appropriate for easy access to any Portacath or PICC line.   We strive to give you quality time with your provider. You may need to reschedule your appointment if you arrive late (15 or more minutes).  Arriving late affects you and other patients whose appointments are after yours.  Also, if you miss three or more appointments without notifying the office, you may be dismissed from the clinic at the providers discretion.      For prescription refill requests, have your pharmacy contact our office and allow 72 hours for refills to be completed.    Today you received the following chemotherapy and/or immunotherapy agents Zometa.   To help prevent nausea and vomiting after your treatment, we encourage you to take your nausea medication as directed.  BELOW ARE SYMPTOMS THAT SHOULD BE REPORTED IMMEDIATELY: *FEVER GREATER THAN 100.4 F (38 C) OR HIGHER *CHILLS OR SWEATING *NAUSEA AND VOMITING THAT IS NOT CONTROLLED WITH YOUR NAUSEA MEDICATION *UNUSUAL SHORTNESS OF BREATH *UNUSUAL BRUISING OR BLEEDING *URINARY PROBLEMS (pain or burning when urinating, or frequent urination) *BOWEL PROBLEMS (unusual diarrhea, constipation, pain near the anus) TENDERNESS IN MOUTH AND THROAT WITH OR WITHOUT PRESENCE OF ULCERS (sore throat, sores in mouth, or a toothache) UNUSUAL RASH, SWELLING OR PAIN  UNUSUAL VAGINAL DISCHARGE OR ITCHING   Items with * indicate a potential emergency and should be followed up as soon as possible or go to the Emergency Department if any problems should occur.  Please show the CHEMOTHERAPY ALERT CARD or IMMUNOTHERAPY ALERT CARD at check-in to the Emergency Department  and triage nurse.  Should you have questions after your visit or need to cancel or reschedule your appointment, please contact [facility] [phone]  and follow the prompts.  Office hours are 8:00 a.m. to 4:30 p.m. Monday - Friday. Please note that voicemails left after 4:00 p.m. may not be returned until the following business day.  We are closed weekends and major holidays. You have access to a nurse at all times for urgent questions. Please call the main number to the clinic [phone] and follow the prompts.  For any non-urgent questions, you may also contact your provider using MyChart. We now offer e-Visits for anyone 80 and older to request care online for non-urgent symptoms. For details visit [email or web address].   Also download the MyChart app! Go to the app store, search "MyChart", open the app, select [facility], and log in with your MyChart username and password.  Due to Covid, a mask is required upon entering the hospital/clinic. If you do not have a mask, one will be given to you upon arrival. For doctor visits, patients may have 1 support person aged 80 or older with them. For treatment visits, patients cannot have anyone with them due to current Covid guidelines and our immunocompromised population.

## 2021-04-01 NOTE — Patient Instructions (Signed)
[place] at [facility] Discharge Instructions   You were seen and examined today by Dr. Delton Coombes.  He reviewed your lab work with you which was normal/stable.   Continue Revlimid as prescribed.  We will proceed with your Zometa infusion today.  Return as scheduled in 3 months.    Thank you for choosing [place] at [facility] to provide your oncology and hematology care.  To afford each patient quality time with our provider, please arrive at least 15 minutes before your scheduled appointment time.   If you have a lab appointment with the [facility] please come in thru the Main Entrance and check in at the main information desk.  You need to re-schedule your appointment should you arrive 10 or more minutes late.  We strive to give you quality time with our providers, and arriving late affects you and other patients whose appointments are after yours.  Also, if you no show three or more times for appointments you may be dismissed from the clinic at the providers discretion.     Again, thank you for choosing [facility].  Our hope is that these requests will decrease the amount of time that you wait before being seen by our physicians.       _____________________________________________________________  Should you have questions after your visit to [facility], please contact our office at [phone] and follow the prompts.  Our office hours are 8:00 a.m. and 4:30 p.m. Monday - Friday.  Please note that voicemails left after 4:00 p.m. may not be returned until the following business day.  We are closed weekends and major holidays.  You do have access to a nurse 24-7, just call the main number to the clinic [phone] and do not press any options, hold on the line and a nurse will answer the phone.    For prescription refill requests, have your pharmacy contact our office and allow 72 hours.    Due to  Covid, you will need to wear a mask upon entering the hospital. If you do not have a mask, a mask will be given to you at the Main Entrance upon arrival. For doctor visits, patients may have 1 support person age 57 or older with them. For treatment visits, patients can not have anyone with them due to social distancing guidelines and our immunocompromised population.

## 2021-04-18 DIAGNOSIS — Z0001 Encounter for general adult medical examination with abnormal findings: Secondary | ICD-10-CM | POA: Diagnosis not present

## 2021-04-18 DIAGNOSIS — I10 Essential (primary) hypertension: Secondary | ICD-10-CM | POA: Diagnosis not present

## 2021-04-18 DIAGNOSIS — E785 Hyperlipidemia, unspecified: Secondary | ICD-10-CM | POA: Diagnosis not present

## 2021-04-22 ENCOUNTER — Other Ambulatory Visit (HOSPITAL_COMMUNITY): Payer: Self-pay | Admitting: Hematology

## 2021-04-22 DIAGNOSIS — C9001 Multiple myeloma in remission: Secondary | ICD-10-CM

## 2021-04-26 ENCOUNTER — Encounter (HOSPITAL_COMMUNITY): Payer: Self-pay | Admitting: Hematology

## 2021-04-26 NOTE — Telephone Encounter (Signed)
Chart reviewed. Revlimid refilled per last office note with Dr. Katragadda.  

## 2021-04-28 DIAGNOSIS — H524 Presbyopia: Secondary | ICD-10-CM | POA: Diagnosis not present

## 2021-05-18 ENCOUNTER — Telehealth (HOSPITAL_COMMUNITY): Payer: Self-pay | Admitting: Pharmacy Technician

## 2021-05-18 NOTE — Telephone Encounter (Signed)
Oral Oncology Patient Advocate Encounter  Coordinated with patient to sign application electronically for [place] in an effort to reduce patient's out of pocket expense for Revlimid to $0.    Application completed and faxed to [phone].   [place] Patient [place] phone number for follow up is [phone].   This encounter will be updated until final determination.   [place] Patient [facility] Phone [phone] Fax [phone] 05/18/2021 12:10 PM

## 2021-05-24 NOTE — Telephone Encounter (Signed)
[employer] rep, Hoy Morn, called to let me know that the patients DOB with [employer] did not match what we submitted.  I called [employer] and a rep sent over an enrollment form to correct and send back. Spoke to Mul[place] at [facility] and emailed her the enrollment form to have Dr Delton Coombes sign.  Called and spoke to the patient and she stopped by the [place] to sign the form and initial the DOB change.  Lilia Pro faxed the enrollment form to [employer].  I will follow up with [employer] tomorrow to verify the fax was received and to see if the DOB has been updated. ? ?Dennison Nancy CPHT ?Specialty Pharmacy Patient Advocate ?[place] ?Phone [phone] ?Fax [phone] ?05/24/2021 3:38 PM ? ?

## 2021-05-26 NOTE — Telephone Encounter (Signed)
Spoke with [employer] rep this afternoon and DOB has been corrected for Jamie Ewing.  I emailed Garry at [facility] Access support to let him know about the update.   ? ?Dennison Nancy CPHT ?Specialty Pharmacy Patient Advocate ?[place] ?Phone [phone] ?Fax [phone] ?05/26/2021 4:13 PM ? ?

## 2021-05-27 ENCOUNTER — Other Ambulatory Visit (HOSPITAL_COMMUNITY): Payer: Self-pay

## 2021-05-27 DIAGNOSIS — C9001 Multiple myeloma in remission: Secondary | ICD-10-CM

## 2021-05-27 MED ORDER — LENALIDOMIDE 10 MG PO CAPS: ORAL_CAPSULE | ORAL | 0 refills | Status: DC

## 2021-05-27 NOTE — Telephone Encounter (Signed)
Chart reviewed. Revlimid refilled per last office note with Dr. Katragadda.  

## 2021-05-31 ENCOUNTER — Other Ambulatory Visit (HOSPITAL_COMMUNITY): Payer: Self-pay | Admitting: Pharmacist

## 2021-05-31 DIAGNOSIS — C9001 Multiple myeloma in remission: Secondary | ICD-10-CM

## 2021-05-31 MED ORDER — LENALIDOMIDE 10 MG PO CAPS: 10.0000 mg | ORAL_CAPSULE | Freq: Every day | ORAL | 0 refills | Status: DC

## 2021-06-02 NOTE — Telephone Encounter (Signed)
Oral Oncology Patient Advocate Encounter ? ?Received notification from [facility] Patient [facility] that patient has been successfully enrolled into their program to receive Revlimid from the manufacturer at $0 out of pocket until 03/20/22.  ?  ?I called and spoke with patient.  She knows we will have to re-apply.  ? ?Specialty Pharmacy that will dispense medication is [facility]. ? ?Patient knows to call the office with questions or concerns. ?  ?Oral Oncology Clinic will continue to follow. ? ?Dennison Nancy CPHT ?Specialty Pharmacy Patient Advocate ?[place] ?Phone [phone] ?Fax [phone] ?06/02/2021 3:21 PM ? ?

## 2021-06-04 DIAGNOSIS — C9001 Multiple myeloma in remission: Secondary | ICD-10-CM | POA: Diagnosis not present

## 2021-06-04 DIAGNOSIS — Z9484 Stem cells transplant status: Secondary | ICD-10-CM | POA: Diagnosis not present

## 2021-06-04 DIAGNOSIS — Z7983 Long term (current) use of bisphosphonates: Secondary | ICD-10-CM | POA: Diagnosis not present

## 2021-06-04 DIAGNOSIS — Z7982 Long term (current) use of aspirin: Secondary | ICD-10-CM | POA: Diagnosis not present

## 2021-06-04 DIAGNOSIS — Z7961 Long term (current) use of immunomodulator: Secondary | ICD-10-CM | POA: Diagnosis not present

## 2021-06-11 DIAGNOSIS — K591 Functional diarrhea: Secondary | ICD-10-CM | POA: Diagnosis not present

## 2021-06-16 ENCOUNTER — Inpatient Hospital Stay (HOSPITAL_COMMUNITY): Payer: Medicare HMO | Attending: Hematology

## 2021-06-16 DIAGNOSIS — C9000 Multiple myeloma not having achieved remission: Secondary | ICD-10-CM | POA: Insufficient documentation

## 2021-06-16 DIAGNOSIS — R197 Diarrhea, unspecified: Secondary | ICD-10-CM | POA: Insufficient documentation

## 2021-06-16 DIAGNOSIS — C9001 Multiple myeloma in remission: Secondary | ICD-10-CM

## 2021-06-16 LAB — CBC WITH DIFFERENTIAL/PLATELET
Abs Immature Granulocytes: 0.02 10*3/uL (ref 0.00–0.07)
Basophils Absolute: 0 10*3/uL (ref 0.0–0.1)
Basophils Relative: 1 %
Eosinophils Absolute: 0.2 10*3/uL (ref 0.0–0.5)
Eosinophils Relative: 5 %
HCT: 38.6 % (ref 36.0–46.0)
Hemoglobin: 12.8 g/dL (ref 12.0–15.0)
Immature Granulocytes: 0 %
Lymphocytes Relative: 30 %
Lymphs Abs: 1.6 10*3/uL (ref 0.7–4.0)
MCH: 34.1 pg — ABNORMAL HIGH (ref 26.0–34.0)
MCHC: 33.2 g/dL (ref 30.0–36.0)
MCV: 102.9 fL — ABNORMAL HIGH (ref 80.0–100.0)
Monocytes Absolute: 0.9 10*3/uL (ref 0.1–1.0)
Monocytes Relative: 17 %
Neutro Abs: 2.6 10*3/uL (ref 1.7–7.7)
Neutrophils Relative %: 47 %
Platelets: 183 10*3/uL (ref 150–400)
RBC: 3.75 MIL/uL — ABNORMAL LOW (ref 3.87–5.11)
RDW: 13.9 % (ref 11.5–15.5)
WBC: 5.3 10*3/uL (ref 4.0–10.5)
nRBC: 0 % (ref 0.0–0.2)

## 2021-06-16 LAB — COMPREHENSIVE METABOLIC PANEL
ALT: 18 U/L (ref 0–44)
AST: 18 U/L (ref 15–41)
Albumin: 4.2 g/dL (ref 3.5–5.0)
Alkaline Phosphatase: 45 U/L (ref 38–126)
Anion gap: 12 (ref 5–15)
BUN: 20 mg/dL (ref 8–23)
CO2: 21 mmol/L — ABNORMAL LOW (ref 22–32)
Calcium: 9.4 mg/dL (ref 8.9–10.3)
Chloride: 101 mmol/L (ref 98–111)
Creatinine, Ser: 1.01 mg/dL — ABNORMAL HIGH (ref 0.44–1.00)
GFR, Estimated: 57 mL/min — ABNORMAL LOW (ref >60)
Glucose, Bld: 106 mg/dL — ABNORMAL HIGH (ref 70–99)
Potassium: 3.4 mmol/L — ABNORMAL LOW (ref 3.5–5.1)
Sodium: 134 mmol/L — ABNORMAL LOW (ref 135–145)
Total Bilirubin: 0.7 mg/dL (ref 0.3–1.2)
Total Protein: 8.1 g/dL (ref 6.5–8.1)

## 2021-06-16 LAB — LACTATE DEHYDROGENASE: LDH: 109 U/L (ref 98–192)

## 2021-06-16 LAB — MAGNESIUM: Magnesium: 1.6 mg/dL — ABNORMAL LOW (ref 1.7–2.4)

## 2021-06-17 LAB — KAPPA/LAMBDA LIGHT CHAINS
Kappa free light chain: 45.6 mg/L — ABNORMAL HIGH (ref 3.3–19.4)
Kappa, lambda light chain ratio: 1.67 — ABNORMAL HIGH (ref 0.26–1.65)
Lambda free light chains: 27.3 mg/L — ABNORMAL HIGH (ref 5.7–26.3)

## 2021-06-18 LAB — PROTEIN ELECTROPHORESIS, SERUM
A/G Ratio: 1 (ref 0.7–1.7)
Albumin ELP: 3.9 g/dL (ref 2.9–4.4)
Alpha-1-Globulin: 0.3 g/dL (ref 0.0–0.4)
Alpha-2-Globulin: 0.8 g/dL (ref 0.4–1.0)
Beta Globulin: 1.5 g/dL — ABNORMAL HIGH (ref 0.7–1.3)
Gamma Globulin: 1.3 g/dL (ref 0.4–1.8)
Globulin, Total: 3.9 g/dL (ref 2.2–3.9)
Total Protein ELP: 7.8 g/dL (ref 6.0–8.5)

## 2021-06-21 LAB — IMMUNOFIXATION ELECTROPHORESIS
IgA: 997 mg/dL — ABNORMAL HIGH (ref 64–422)
IgG (Immunoglobin G), Serum: 1346 mg/dL (ref 586–1602)
IgM (Immunoglobulin M), Srm: 14 mg/dL — ABNORMAL LOW (ref 26–217)
Total Protein ELP: 7.9 g/dL (ref 6.0–8.5)

## 2021-06-23 ENCOUNTER — Inpatient Hospital Stay (HOSPITAL_COMMUNITY): Payer: Medicare HMO | Attending: Hematology | Admitting: Hematology

## 2021-06-23 ENCOUNTER — Inpatient Hospital Stay (HOSPITAL_COMMUNITY): Payer: Medicare HMO

## 2021-06-23 ENCOUNTER — Other Ambulatory Visit (HOSPITAL_COMMUNITY): Payer: Medicare HMO

## 2021-06-23 VITALS — BP 115/56 | HR 74 | Temp 97.8°F | Resp 18

## 2021-06-23 VITALS — BP 142/76 | HR 88 | Temp 97.5°F | Resp 20 | Ht 64.17 in | Wt 156.1 lb

## 2021-06-23 DIAGNOSIS — E876 Hypokalemia: Secondary | ICD-10-CM

## 2021-06-23 DIAGNOSIS — C9000 Multiple myeloma not having achieved remission: Secondary | ICD-10-CM | POA: Diagnosis not present

## 2021-06-23 MED ORDER — SODIUM CHLORIDE 0.9 % IV SOLN: Freq: Once | INTRAVENOUS | Status: AC

## 2021-06-23 MED ORDER — ZOLEDRONIC ACID 4 MG/100ML IV SOLN
4.0000 mg | Freq: Once | INTRAVENOUS | Status: AC
  Administered 2021-06-23: 4 mg via INTRAVENOUS
  Filled 2021-06-23: qty 100

## 2021-06-23 MED ORDER — MAGNESIUM OXIDE -MG SUPPLEMENT 400 (240 MG) MG PO TABS
400.0000 mg | ORAL_TABLET | Freq: Once | ORAL | Status: AC
  Administered 2021-06-23: 400 mg via ORAL
  Filled 2021-06-23: qty 1

## 2021-06-23 MED ORDER — POTASSIUM CHLORIDE CRYS ER 20 MEQ PO TBCR
40.0000 meq | EXTENDED_RELEASE_TABLET | Freq: Once | ORAL | Status: AC
  Administered 2021-06-23: 40 meq via ORAL
  Filled 2021-06-23: qty 2

## 2021-06-23 NOTE — Patient Instructions (Signed)
[place] at [facility] ?Discharge Instructions ? ? ?You were seen and examined today by Dr. Delton Coombes. ? ?He reviewed your lab work which is normal/stable except your potassium and magnesium were slightly low.  This is likely coming from the diarrhea you have been having.  We will give you magnesium and potassium pills today in the clinic. If the diarrhea continues, it would be a good idea to get a colonoscopy.  ? ?We will proceed with your Zometa infusion today.  ? ?Return as scheduled.  ? ? ? ?Thank you for choosing [place] at [facility] to provide your oncology and hematology care.  To afford each patient quality time with our provider, please arrive at least 15 minutes before your scheduled appointment time.  ? ?If you have a lab appointment with the [facility] please come in thru the Main Entrance and check in at the main information desk. ? ?You need to re-schedule your appointment should you arrive 10 or more minutes late.  We strive to give you quality time with our providers, and arriving late affects you and other patients whose appointments are after yours.  Also, if you no show three or more times for appointments you may be dismissed from the clinic at the providers discretion.     ?Again, thank you for choosing [facility].  Our hope is that these requests will decrease the amount of time that you wait before being seen by our physicians.       ?_____________________________________________________________ ? ?Should you have questions after your visit to [facility], please contact our office at [phone] and follow the prompts.  Our office hours are 8:00 a.m. and 4:30 p.m. Monday - Friday.  Please note that voicemails left after 4:00 p.m. may not be returned until the following business day.  We are closed weekends and major holidays.  You do have access to a nurse 24-7, just call the main number to the clinic  [phone] and do not press any options, hold on the line and a nurse will answer the phone.   ? ?For prescription refill requests, have your pharmacy contact our office and allow 72 hours.   ? ?Due to Covid, you will need to wear a mask upon entering the hospital. If you do not have a mask, a mask will be given to you at the Main Entrance upon arrival. For doctor visits, patients may have 1 support person age 100 or older with them. For treatment visits, patients can not have anyone with them due to social distancing guidelines and our immunocompromised population.  ? ?   ?

## 2021-06-23 NOTE — Progress Notes (Signed)
? ?[place] ?[address] ?[place], [place] [address] ? ? ?CLINIC:  ?Medical Oncology/Hematology ? ?PCP:  ?Asencion Noble, MD ?[address] / [place] [address] ?[phone] ? ? ?REASON FOR VISIT:  ?Follow-up for multiple myeloma ? ?PRIOR THERAPY:  ?1. Radiation through 11/10/2017. ?2. RVD x 5 cycles from 11/14/2017 to 02/19/2018. ?3. Stem cell transplant on 04/03/2018. ? ?NGS Results: not done ? ?CURRENT THERAPY: Maintenance Revlimid 10 mg 3/4 weeks ? ?BRIEF ONCOLOGIC HISTORY:  ?Oncology History  ?Multiple myeloma ([place])  ?10/10/2017 Initial Diagnosis  ? Multiple myeloma ([place]) ?  ?11/14/2017 - 02/26/2018 Chemotherapy  ? The patient had dexamethasone (DECADRON) tablet 40 mg, 40 mg (100 % of original dose 40 mg), Oral,  Once, 1 of 1 cycle ?Dose modification: 40 mg (original dose 40 mg, Cycle 1) ?Administration: 40 mg (11/14/2017) ?dexamethasone (DECADRON) 4 MG tablet, 1 of 1 cycle, Start date: 11/02/2017, End date: 03/12/2018 ?lenalidomide (REVLIMID) 25 MG capsule, 1 of 1 cycle, Start date: 02/28/2018, End date: 03/12/2018 ?bortezomib SQ (VELCADE) chemo injection 2.5 mg, 1.3 mg/m2 = 2.5 mg, Subcutaneous,  Once, 5 of 6 cycles ?Administration: 2.5 mg (11/14/2017), 2.5 mg (11/21/2017), 2.5 mg (11/28/2017), 2.5 mg (12/14/2017), 2.5 mg (12/21/2017), 2.5 mg (12/28/2017), 2.5 mg (01/04/2018), 2.5 mg (01/11/2018), 2.5 mg (01/18/2018), 2.5 mg (01/25/2018), 2.5 mg (02/01/2018), 2.5 mg (02/08/2018), 2.5 mg (02/19/2018), 2.5 mg (02/26/2018) ? ? for chemotherapy treatment.  ? ?  ? ? ?CANCER STAGING: ? Cancer Staging  ?No matching staging information was found for the patient. ? ?INTERVAL HISTORY:  ?Jamie Ewing, a 81 y.o. female, returns for routine follow-up of her multiple myeloma. Jamie Ewing was last seen on 04/01/2021.  ? ?Today she reports feeling well. She reports low energy. She is taking Revlimid, and she is tolerating it well. She reports diarrhea occurring 3-4 times daily. She denies dental issues,  infections, and fevers. Tingling/numbness in her hands are stable.  ? ?REVIEW OF SYSTEMS:  ?Review of Systems  ?Constitutional:  Positive for fatigue. Negative for appetite change and fever.  ?Gastrointestinal:  Positive for diarrhea.  ?Musculoskeletal:  Positive for arthralgias (6/10 leg).  ?Neurological:  Positive for numbness (stable).  ?All other systems reviewed and are negative. ? ?PAST MEDICAL/SURGICAL HISTORY:  ?Past Medical History:  ?Diagnosis Date  ? Back pain   ? Hypercholesteremia   ? Hypertension   ? ?Past Surgical History:  ?Procedure Laterality Date  ? BONE MARROW BIOPSY    ? LAMINECTOMY N/A 10/06/2017  ? Procedure: THORACIC NINE AND TEN LAMINECTOMY WITH RESECTION OF TUMOR, THORACIC EIGHT TO THORACIC ELEVEN FUSION WITH PEDICLE SCREW FIXATION;  Surgeon: Earnie Larsson, MD;  Location: [facility];  Service: Neurosurgery;  Laterality: N/A;  ? REPLACEMENT TOTAL KNEE Left 2003  ? TOTAL KNEE ARTHROPLASTY  2001  ? ? ?SOCIAL HISTORY:  ?Social History  ? ?Socioeconomic History  ? Marital status: Widowed  ?  Spouse name: Not on file  ? Number of children: 2  ? Years of education: Not on file  ? Highest education level: Not on file  ?Occupational History  ? Not on file  ?Tobacco Use  ? Smoking status: Former  ?  Packs/day: 0.50  ?  Years: 30.00  ?  Pack years: 15.00  ?  Types: Cigarettes  ?  Quit date: 05/31/2006  ?  Years since quitting: 15.0  ? Smokeless tobacco: Never  ?Vaping Use  ? Vaping Use: Never used  ?Substance and Sexual Activity  ? Alcohol use: Yes  ?  Comment: wine occassionally  ? Drug use: No  ? Sexual activity: Not on file  ?Other Topics Concern  ? Not on file  ?Social History Narrative  ? Not on file  ? ?Social Determinants of Health  ? ?Financial Resource Strain: Not on file  ?Food Insecurity: Not on file  ?Transportation Needs: Not on file  ?Physical Activity: Not on file  ?Stress: Not on file  ?Social Connections: Not on file  ?Intimate Partner Violence: Not on file  ? ? ?FAMILY HISTORY:  ?Family  History  ?Problem Relation Age of Onset  ? Tuberculosis Mother   ? Kidney failure Father   ? Hypertension Paternal Aunt   ? Diabetes Paternal Aunt   ? Hypertension Paternal Uncle   ? Diabetes Paternal Uncle   ? Hypertension Daughter   ? Stroke Daughter   ? ? ?CURRENT MEDICATIONS:  ?Current Outpatient Medications  ?Medication Sig Dispense Refill  ? acetaminophen (TYLENOL) 500 MG tablet Take by mouth.    ? amLODipine (NORVASC) 5 MG tablet Take 5 mg by mouth daily.     ? calcium carbonate (OSCAL) 1500 (600 Ca) MG TABS tablet Take by mouth.    ? hydrochlorothiazide (HYDRODIURIL) 25 MG tablet Take 25 mg by mouth daily.    ? lenalidomide (REVLIMID) 10 MG capsule Take 1 capsule (10 mg total) by mouth daily. Take for 21 days, then hold for 7 days. Repeat every 28 days. 21 capsule 0  ? LOW-DOSE ASPIRIN PO Asprin Ec Low Dose    ? Multiple Vitamins-Minerals (THERA-M) TABS Thera-M 9 mg iron-400 mcg tablet    ? polyethylene glycol (MIRALAX / GLYCOLAX) packet Take 17 g by mouth as needed.     ? potassium chloride SA (KLOR-CON) 20 MEQ tablet TAKE 1 TABLET TWICE DAILY 180 tablet 2  ? vitamin B-12 (CYANOCOBALAMIN) 1000 MCG tablet Vitamin B12    ? HYDROcodone bit-homatropine (HYCODAN) 5-1.5 MG/5ML syrup Take 5 mLs by mouth every 4 (four) hours as needed.    ? ?No current facility-administered medications for this visit.  ? ? ?ALLERGIES:  ?No Known Allergies ? ?PHYSICAL EXAM:  ?Performance status (ECOG): 1 - Symptomatic but completely ambulatory ? ?Vitals:  ? 06/23/21 1310  ?BP: (!) 142/76  ?Pulse: 88  ?Resp: 20  ?Temp: (!) 97.5 ?F (36.4 ?C)  ?SpO2: 100%  ? ?Wt Readings from Last 3 Encounters:  ?06/23/21 156 lb 1.4 oz (70.8 kg)  ?04/01/21 161 lb 3.2 oz (73.1 kg)  ?01/07/21 163 lb 12.8 oz (74.3 kg)  ? ?Physical Exam ?Vitals reviewed.  ?Constitutional:   ?   Appearance: Normal appearance.  ?Cardiovascular:  ?   Rate and Rhythm: Normal rate and regular rhythm.  ?   Pulses: Normal pulses.  ?   Heart sounds: Normal heart sounds.   ?Pulmonary:  ?   Effort: Pulmonary effort is normal.  ?   Breath sounds: Normal breath sounds.  ?Neurological:  ?   General: No focal deficit present.  ?   Mental Status: She is alert and oriented to person, place, and time.  ?Psychiatric:     ?   Mood and Affect: Mood normal.     ?   Behavior: Behavior normal.  ?  ? ?LABORATORY DATA:  ?I have reviewed the labs as listed.  ? ?  Latest Ref Rng & Units 06/16/2021  ? 12:56 PM 03/25/2021  ?  1:32 PM 12/31/2020  ?  1:03 PM  ?CBC  ?WBC 4.0 - 10.5 K/uL 5.3   4.5  4.1    ?Hemoglobin 12.0 - 15.0 g/dL 12.8   12.5   12.3    ?Hematocrit 36.0 - 46.0 % 38.6   38.2   36.0    ?Platelets 150 - 400 K/uL 183   204   195    ? ? ?  Latest Ref Rng & Units 06/16/2021  ? 12:56 PM 03/25/2021  ?  1:32 PM 12/31/2020  ?  1:03 PM  ?CMP  ?Glucose 70 - 99 mg/dL 106   107   100    ?BUN 8 - 23 mg/dL $Remove'20   19   15    'BZesKVI$ ?Creatinine 0.44 - 1.00 mg/dL 1.01   0.93   0.96    ?Sodium 135 - 145 mmol/L 134   136   135    ?Potassium 3.5 - 5.1 mmol/L 3.4   3.6   3.6    ?Chloride 98 - 111 mmol/L 101   100   102    ?CO2 22 - 32 mmol/L $RemoveB'21   24   25    'zHClpYUC$ ?Calcium 8.9 - 10.3 mg/dL 9.4   9.9   9.5    ?Total Protein 6.5 - 8.1 g/dL 8.1   7.9   8.1    ?Total Bilirubin 0.3 - 1.2 mg/dL 0.7   0.8   0.9    ?Alkaline Phos 38 - 126 U/L 45   49   51    ?AST 15 - 41 U/L $Remo'18   19   15    'DDGkD$ ?ALT 0 - 44 U/L $Remo'18   18   17    'AjjVV$ ? ? ?DIAGNOSTIC IMAGING:  ?I have independently reviewed the scans and discussed with the patient. ?No results found.  ? ?ASSESSMENT:  ?1.  Stage II IgA kappa multiple myeloma, standard risk: ?-XRT to the spine completed on November 10, 2017 following T9-T10 laminectomy with resection of epidural tumor. ?-5 cycles of RVD from 11/14/2017 through 02/19/2018 followed by stem cell transplant on April 03, 2018. ?-Revlimid maintenance therapy 10 mg 3 weeks on/1 week off started on August 24, 2018. ?-PET scan on 09/02/2019 showed redemonstrated bilateral cervical, right supraclavicular, right axillary hypermetabolic adenopathy with  interval resolution of left axillary adenopathy.  Similar lytic and sclerotic lesions in bones with no corresponding FDG uptake.  Persistent increased FDG uptake in both palatine tonsils. ?-Reviewed myeloma panel from 09/26/2019.

## 2021-06-23 NOTE — Progress Notes (Signed)
Patient presents today for Zometa infusion.  Patient is in satisfactory condition with no complaints voiced.  Vital signs are stable. Labs drawn on 06/16/21.  Potassium was 3.4 and magnesium was 1.6.  Patient will receive Klor Con 40 mEq PO x one dose and Mag Ox 400 mg PO x one dose today per Dr. Delton Coombes.  We will proceed with treatment per MD orders.   ? ?Patient tolerated treatment well with no complaints voiced.  Patient left ambulatory in stable condition.  Vital signs stable at discharge.  Follow up as scheduled.    ?

## 2021-06-23 NOTE — Patient Instructions (Signed)
[facility]  Discharge Instructions: Thank you for choosing [facility] to provide your oncology and hematology care.  If you have a lab appointment with the Cancer Center, please come in thru the Main Entrance and check in at the main information desk.  Wear comfortable clothing and clothing appropriate for easy access to any Portacath or PICC line.   We strive to give you quality time with your provider. You may need to reschedule your appointment if you arrive late (15 or more minutes).  Arriving late affects you and other patients whose appointments are after yours.  Also, if you miss three or more appointments without notifying the office, you may be dismissed from the clinic at the provider's discretion.      For prescription refill requests, have your pharmacy contact our office and allow 72 hours for refills to be completed.        To help prevent nausea and vomiting after your treatment, we encourage you to take your nausea medication as directed.  BELOW ARE SYMPTOMS THAT SHOULD BE REPORTED IMMEDIATELY: *FEVER GREATER THAN 100.4 F (38 C) OR HIGHER *CHILLS OR SWEATING *NAUSEA AND VOMITING THAT IS NOT CONTROLLED WITH YOUR NAUSEA MEDICATION *UNUSUAL SHORTNESS OF BREATH *UNUSUAL BRUISING OR BLEEDING *URINARY PROBLEMS (pain or burning when urinating, or frequent urination) *BOWEL PROBLEMS (unusual diarrhea, constipation, pain near the anus) TENDERNESS IN MOUTH AND THROAT WITH OR WITHOUT PRESENCE OF ULCERS (sore throat, sores in mouth, or a toothache) UNUSUAL RASH, SWELLING OR PAIN  UNUSUAL VAGINAL DISCHARGE OR ITCHING   Items with * indicate a potential emergency and should be followed up as soon as possible or go to the Emergency Department if any problems should occur.  Please show the CHEMOTHERAPY ALERT CARD or IMMUNOTHERAPY ALERT CARD at check-in to the Emergency Department and triage nurse.  Should you have questions after your visit or need to cancel  or reschedule your appointment, please contact [facility] [phone]  and follow the prompts.  Office hours are 8:00 a.m. to 4:30 p.m. Monday - Friday. Please note that voicemails left after 4:00 p.m. may not be returned until the following business day.  We are closed weekends and major holidays. You have access to a nurse at all times for urgent questions. Please call the main number to the clinic [phone] and follow the prompts.  For any non-urgent questions, you may also contact your provider using MyChart. We now offer e-Visits for anyone 18 and older to request care online for non-urgent symptoms. For details visit [email or web address].   Also download the MyChart app! Go to the app store, search "MyChart", open the app, select [facility], and log in with your MyChart username and password.  Due to Covid, a mask is required upon entering the hospital/clinic. If you do not have a mask, one will be given to you upon arrival. For doctor visits, patients may have 1 support person aged 18 or older with them. For treatment visits, patients cannot have anyone with them due to current Covid guidelines and our immunocompromised population.  

## 2021-07-02 ENCOUNTER — Other Ambulatory Visit (HOSPITAL_COMMUNITY): Payer: Self-pay

## 2021-07-02 DIAGNOSIS — C9001 Multiple myeloma in remission: Secondary | ICD-10-CM

## 2021-07-02 MED ORDER — LENALIDOMIDE 10 MG PO CAPS: 10.0000 mg | ORAL_CAPSULE | Freq: Every day | ORAL | 0 refills | Status: DC

## 2021-07-02 NOTE — Telephone Encounter (Signed)
Chart reviewed. Revlimid refilled per last office note with Dr. Katragadda.  

## 2021-08-09 ENCOUNTER — Other Ambulatory Visit (HOSPITAL_COMMUNITY): Payer: Self-pay

## 2021-08-09 ENCOUNTER — Other Ambulatory Visit (HOSPITAL_COMMUNITY): Payer: Self-pay | Admitting: Hematology

## 2021-08-09 DIAGNOSIS — C9001 Multiple myeloma in remission: Secondary | ICD-10-CM

## 2021-08-09 DIAGNOSIS — E876 Hypokalemia: Secondary | ICD-10-CM

## 2021-08-09 MED ORDER — LENALIDOMIDE 10 MG PO CAPS: 10.0000 mg | ORAL_CAPSULE | Freq: Every day | ORAL | 0 refills | Status: DC

## 2021-08-09 NOTE — Telephone Encounter (Signed)
Chart reviewed. Revlimid refilled per last office note with Dr. Katragadda.  

## 2021-09-08 ENCOUNTER — Inpatient Hospital Stay (HOSPITAL_COMMUNITY): Payer: Medicare HMO | Attending: Hematology

## 2021-09-08 ENCOUNTER — Other Ambulatory Visit (HOSPITAL_COMMUNITY): Payer: Self-pay

## 2021-09-08 DIAGNOSIS — Z87891 Personal history of nicotine dependence: Secondary | ICD-10-CM | POA: Diagnosis not present

## 2021-09-08 DIAGNOSIS — I10 Essential (primary) hypertension: Secondary | ICD-10-CM | POA: Diagnosis not present

## 2021-09-08 DIAGNOSIS — Z833 Family history of diabetes mellitus: Secondary | ICD-10-CM | POA: Diagnosis not present

## 2021-09-08 DIAGNOSIS — Z841 Family history of disorders of kidney and ureter: Secondary | ICD-10-CM | POA: Insufficient documentation

## 2021-09-08 DIAGNOSIS — G629 Polyneuropathy, unspecified: Secondary | ICD-10-CM | POA: Insufficient documentation

## 2021-09-08 DIAGNOSIS — Z79899 Other long term (current) drug therapy: Secondary | ICD-10-CM | POA: Diagnosis not present

## 2021-09-08 DIAGNOSIS — Z8249 Family history of ischemic heart disease and other diseases of the circulatory system: Secondary | ICD-10-CM | POA: Diagnosis not present

## 2021-09-08 DIAGNOSIS — R5383 Other fatigue: Secondary | ICD-10-CM | POA: Insufficient documentation

## 2021-09-08 DIAGNOSIS — R200 Anesthesia of skin: Secondary | ICD-10-CM | POA: Insufficient documentation

## 2021-09-08 DIAGNOSIS — Z823 Family history of stroke: Secondary | ICD-10-CM | POA: Insufficient documentation

## 2021-09-08 DIAGNOSIS — C9000 Multiple myeloma not having achieved remission: Secondary | ICD-10-CM | POA: Diagnosis not present

## 2021-09-08 DIAGNOSIS — C9001 Multiple myeloma in remission: Secondary | ICD-10-CM

## 2021-09-08 DIAGNOSIS — Z9484 Stem cells transplant status: Secondary | ICD-10-CM | POA: Insufficient documentation

## 2021-09-08 DIAGNOSIS — E7800 Pure hypercholesterolemia, unspecified: Secondary | ICD-10-CM | POA: Insufficient documentation

## 2021-09-08 DIAGNOSIS — R197 Diarrhea, unspecified: Secondary | ICD-10-CM | POA: Insufficient documentation

## 2021-09-08 DIAGNOSIS — Z7961 Long term (current) use of immunomodulator: Secondary | ICD-10-CM | POA: Insufficient documentation

## 2021-09-08 DIAGNOSIS — Z836 Family history of other diseases of the respiratory system: Secondary | ICD-10-CM | POA: Diagnosis not present

## 2021-09-08 LAB — COMPREHENSIVE METABOLIC PANEL
ALT: 16 U/L (ref 0–44)
AST: 14 U/L — ABNORMAL LOW (ref 15–41)
Albumin: 3.9 g/dL (ref 3.5–5.0)
Alkaline Phosphatase: 47 U/L (ref 38–126)
Anion gap: 8 (ref 5–15)
BUN: 11 mg/dL (ref 8–23)
CO2: 25 mmol/L (ref 22–32)
Calcium: 9.7 mg/dL (ref 8.9–10.3)
Chloride: 103 mmol/L (ref 98–111)
Creatinine, Ser: 0.93 mg/dL (ref 0.44–1.00)
GFR, Estimated: >60 mL/min (ref >60)
Glucose, Bld: 100 mg/dL — ABNORMAL HIGH (ref 70–99)
Potassium: 3.9 mmol/L (ref 3.5–5.1)
Sodium: 136 mmol/L (ref 135–145)
Total Bilirubin: 0.6 mg/dL (ref 0.3–1.2)
Total Protein: 8.3 g/dL — ABNORMAL HIGH (ref 6.5–8.1)

## 2021-09-08 LAB — CBC WITH DIFFERENTIAL/PLATELET
Abs Immature Granulocytes: 0.01 10*3/uL (ref 0.00–0.07)
Basophils Absolute: 0 10*3/uL (ref 0.0–0.1)
Basophils Relative: 1 %
Eosinophils Absolute: 0.1 10*3/uL (ref 0.0–0.5)
Eosinophils Relative: 4 %
HCT: 36.8 % (ref 36.0–46.0)
Hemoglobin: 12.3 g/dL (ref 12.0–15.0)
Immature Granulocytes: 0 %
Lymphocytes Relative: 36 %
Lymphs Abs: 1.3 10*3/uL (ref 0.7–4.0)
MCH: 34.6 pg — ABNORMAL HIGH (ref 26.0–34.0)
MCHC: 33.4 g/dL (ref 30.0–36.0)
MCV: 103.7 fL — ABNORMAL HIGH (ref 80.0–100.0)
Monocytes Absolute: 0.6 10*3/uL (ref 0.1–1.0)
Monocytes Relative: 16 %
Neutro Abs: 1.5 10*3/uL — ABNORMAL LOW (ref 1.7–7.7)
Neutrophils Relative %: 43 %
Platelets: 184 10*3/uL (ref 150–400)
RBC: 3.55 MIL/uL — ABNORMAL LOW (ref 3.87–5.11)
RDW: 13.9 % (ref 11.5–15.5)
WBC: 3.5 10*3/uL — ABNORMAL LOW (ref 4.0–10.5)
nRBC: 0 % (ref 0.0–0.2)

## 2021-09-08 LAB — MAGNESIUM: Magnesium: 1.8 mg/dL (ref 1.7–2.4)

## 2021-09-08 LAB — LACTATE DEHYDROGENASE: LDH: 100 U/L (ref 98–192)

## 2021-09-08 MED ORDER — LENALIDOMIDE 10 MG PO CAPS: 10.0000 mg | ORAL_CAPSULE | Freq: Every day | ORAL | 0 refills | Status: DC

## 2021-09-08 NOTE — Telephone Encounter (Signed)
Chart reviewed. Revlimid refilled per last office note with Dr. Katragadda.  

## 2021-09-09 LAB — KAPPA/LAMBDA LIGHT CHAINS
Kappa free light chain: 55.6 mg/L — ABNORMAL HIGH (ref 3.3–19.4)
Kappa, lambda light chain ratio: 1.79 — ABNORMAL HIGH (ref 0.26–1.65)
Lambda free light chains: 31.1 mg/L — ABNORMAL HIGH (ref 5.7–26.3)

## 2021-09-10 LAB — PROTEIN ELECTROPHORESIS, SERUM
A/G Ratio: 1 (ref 0.7–1.7)
Albumin ELP: 3.9 g/dL (ref 2.9–4.4)
Alpha-1-Globulin: 0.2 g/dL (ref 0.0–0.4)
Alpha-2-Globulin: 0.8 g/dL (ref 0.4–1.0)
Beta Globulin: 1.5 g/dL — ABNORMAL HIGH (ref 0.7–1.3)
Gamma Globulin: 1.2 g/dL (ref 0.4–1.8)
Globulin, Total: 3.8 g/dL (ref 2.2–3.9)
Total Protein ELP: 7.7 g/dL (ref 6.0–8.5)

## 2021-09-14 LAB — IMMUNOFIXATION ELECTROPHORESIS
IgA: 958 mg/dL — ABNORMAL HIGH (ref 64–422)
IgG (Immunoglobin G), Serum: 1268 mg/dL (ref 586–1602)
IgM (Immunoglobulin M), Srm: 28 mg/dL (ref 26–217)
Total Protein ELP: 7.6 g/dL (ref 6.0–8.5)

## 2021-09-15 ENCOUNTER — Inpatient Hospital Stay (HOSPITAL_BASED_OUTPATIENT_CLINIC_OR_DEPARTMENT_OTHER): Payer: Medicare HMO | Admitting: Hematology

## 2021-09-15 ENCOUNTER — Inpatient Hospital Stay (HOSPITAL_COMMUNITY): Payer: Medicare HMO

## 2021-09-15 VITALS — BP 142/68 | HR 71 | Resp 18

## 2021-09-15 VITALS — BP 134/70 | HR 76 | Temp 97.6°F | Resp 18 | Ht 64.5 in | Wt 158.5 lb

## 2021-09-15 DIAGNOSIS — C9000 Multiple myeloma not having achieved remission: Secondary | ICD-10-CM

## 2021-09-15 DIAGNOSIS — C9001 Multiple myeloma in remission: Secondary | ICD-10-CM | POA: Diagnosis not present

## 2021-09-15 DIAGNOSIS — G629 Polyneuropathy, unspecified: Secondary | ICD-10-CM | POA: Diagnosis not present

## 2021-09-15 DIAGNOSIS — R200 Anesthesia of skin: Secondary | ICD-10-CM | POA: Diagnosis not present

## 2021-09-15 DIAGNOSIS — Z79899 Other long term (current) drug therapy: Secondary | ICD-10-CM | POA: Diagnosis not present

## 2021-09-15 DIAGNOSIS — E7800 Pure hypercholesterolemia, unspecified: Secondary | ICD-10-CM | POA: Diagnosis not present

## 2021-09-15 DIAGNOSIS — R197 Diarrhea, unspecified: Secondary | ICD-10-CM | POA: Diagnosis not present

## 2021-09-15 DIAGNOSIS — I10 Essential (primary) hypertension: Secondary | ICD-10-CM | POA: Diagnosis not present

## 2021-09-15 DIAGNOSIS — R5383 Other fatigue: Secondary | ICD-10-CM | POA: Diagnosis not present

## 2021-09-15 DIAGNOSIS — Z7961 Long term (current) use of immunomodulator: Secondary | ICD-10-CM | POA: Diagnosis not present

## 2021-09-15 MED ORDER — ZOLEDRONIC ACID 4 MG/100ML IV SOLN
4.0000 mg | Freq: Once | INTRAVENOUS | Status: AC
  Administered 2021-09-15: 4 mg via INTRAVENOUS
  Filled 2021-09-15: qty 100

## 2021-09-15 MED ORDER — SODIUM CHLORIDE 0.9 % IV SOLN: Freq: Once | INTRAVENOUS | Status: AC

## 2021-09-15 NOTE — Patient Instructions (Signed)
[place] at [facility] Discharge Instructions   You were seen and examined today by Dr. Delton Coombes.  He reviewed the results of your lab work which are normal/stable.   Continue Revlimid as prescribed.   We will give you Zometa infusion today.   Return as scheduled in 3 months.    Thank you for choosing [place] at [facility] to provide your oncology and hematology care.  To afford each patient quality time with our provider, please arrive at least 15 minutes before your scheduled appointment time.   If you have a lab appointment with the [facility] please come in thru the Main Entrance and check in at the main information desk.  You need to re-schedule your appointment should you arrive 10 or more minutes late.  We strive to give you quality time with our providers, and arriving late affects you and other patients whose appointments are after yours.  Also, if you no show three or more times for appointments you may be dismissed from the clinic at the providers discretion.     Again, thank you for choosing [facility].  Our hope is that these requests will decrease the amount of time that you wait before being seen by our physicians.       _____________________________________________________________  Should you have questions after your visit to [facility], please contact our office at [phone] and follow the prompts.  Our office hours are 8:00 a.m. and 4:30 p.m. Monday - Friday.  Please note that voicemails left after 4:00 p.m. may not be returned until the following business day.  We are closed weekends and major holidays.  You do have access to a nurse 24-7, just call the main number to the clinic [phone] and do not press any options, hold on the line and a nurse will answer the phone.    For prescription refill requests, have your pharmacy contact our office and allow 72 hours.    Due to  Covid, you will need to wear a mask upon entering the hospital. If you do not have a mask, a mask will be given to you at the Main Entrance upon arrival. For doctor visits, patients may have 1 support person age 67 or older with them. For treatment visits, patients can not have anyone with them due to social distancing guidelines and our immunocompromised population.

## 2021-09-15 NOTE — Progress Notes (Signed)
[facility] [address], [place] [address]   CLINIC:  Medical Oncology/Hematology  PCP:  Carylon Perches, MD [address] / [place] [address] [phone]   REASON FOR VISIT:  Follow-up for multiple myeloma  PRIOR THERAPY:  1. Radiation through 11/10/2017. 2. RVD x 5 cycles from 11/14/2017 to 02/19/2018. 3. Stem cell transplant on 04/03/2018.  NGS Results: not done  CURRENT THERAPY: Maintenance Revlimid 10 mg 3/4 weeks  BRIEF ONCOLOGIC HISTORY:  Oncology History  Multiple myeloma (HCC)  10/10/2017 Initial Diagnosis   Multiple myeloma (HCC)   11/14/2017 - 02/26/2018 Chemotherapy   The patient had dexamethasone (DECADRON) tablet 40 mg, 40 mg (100 % of original dose 40 mg), Oral,  Once, 1 of 1 cycle Dose modification: 40 mg (original dose 40 mg, Cycle 1) Administration: 40 mg (11/14/2017) dexamethasone (DECADRON) 4 MG tablet, 1 of 1 cycle, Start date: 11/02/2017, End date: 03/12/2018 lenalidomide (REVLIMID) 25 MG capsule, 1 of 1 cycle, Start date: 02/28/2018, End date: 03/12/2018 bortezomib SQ (VELCADE) chemo injection 2.5 mg, 1.3 mg/m2 = 2.5 mg, Subcutaneous,  Once, 5 of 6 cycles Administration: 2.5 mg (11/14/2017), 2.5 mg (11/21/2017), 2.5 mg (11/28/2017), 2.5 mg (12/14/2017), 2.5 mg (12/21/2017), 2.5 mg (12/28/2017), 2.5 mg (01/04/2018), 2.5 mg (01/11/2018), 2.5 mg (01/18/2018), 2.5 mg (01/25/2018), 2.5 mg (02/01/2018), 2.5 mg (02/08/2018), 2.5 mg (02/19/2018), 2.5 mg (02/26/2018)  for chemotherapy treatment.      CANCER STAGING:  Cancer Staging  No matching staging information was found for the patient.  INTERVAL HISTORY:  [name], a 81 y.o. female, returns for routine follow-up of her multiple myeloma. [name] was last seen on 06/23/2021.   Today she reports feeling good. She is taking Revlimid and reports tolerating it well. She denies infections, cough, and new pains. The numbness in her feet is stable. Her diarrhea is improving. Her  energy is low.   REVIEW OF SYSTEMS:  Review of Systems  Constitutional:  Positive for fatigue. Negative for appetite change.  Respiratory:  Negative for cough.   Gastrointestinal:  Positive for diarrhea (improving).  Neurological:  Positive for numbness (stable).  All other systems reviewed and are negative.   PAST MEDICAL/SURGICAL HISTORY:  Past Medical History:  Diagnosis Date   Back pain    Hypercholesteremia    Hypertension    Past Surgical History:  Procedure Laterality Date   BONE MARROW BIOPSY     LAMINECTOMY N/A 10/06/2017   Procedure: THORACIC NINE AND TEN LAMINECTOMY WITH RESECTION OF TUMOR, THORACIC EIGHT TO THORACIC ELEVEN FUSION WITH PEDICLE SCREW FIXATION;  Surgeon: Julio Sicks, MD;  Location: [facility] OR;  Service: Neurosurgery;  Laterality: N/A;   REPLACEMENT TOTAL KNEE Left 2003   TOTAL KNEE ARTHROPLASTY  2001    SOCIAL HISTORY:  Social History   Socioeconomic History   Marital status: Widowed    Spouse name: Not on file   Number of children: 2   Years of education: Not on file   Highest education level: Not on file  Occupational History   Not on file  Tobacco Use   Smoking status: Former    Packs/day: 0.50    Years: 30.00    Total pack years: 15.00    Types: Cigarettes    Quit date: 05/31/2006    Years since quitting: 15.3   Smokeless tobacco: Never  Vaping Use   Vaping Use: Never used  Substance and Sexual Activity   Alcohol use: Yes    Comment: wine occassionally  Drug use: No   Sexual activity: Not on file  Other Topics Concern   Not on file  Social History Narrative   Not on file   Social Determinants of Health   Financial Resource Strain: Low Risk  (10/19/2017)   Overall Financial Resource Strain (CARDIA)    Difficulty of Paying Living Expenses: Not very hard  Food Insecurity: No Food Insecurity (10/19/2017)   Hunger Vital Sign    Worried About Running Out of Food in the Last Year: Never true    Ran Out of Food in the Last Year: Never  true  Transportation Needs: No Transportation Needs (10/19/2017)   PRAPARE - Hydrologist (Medical): No    Lack of Transportation (Non-Medical): No  Physical Activity: Sufficiently Active (10/19/2017)   Exercise Vital Sign    Days of Exercise per Week: 4 days    Minutes of Exercise per Session: 120 min  Stress: No Stress Concern Present (10/19/2017)   [place]    Feeling of Stress : Not at all  Social Connections: Moderately Integrated (10/19/2017)   Social Connection and Isolation Panel [NHANES]    Frequency of Communication with Friends and Family: More than three times a week    Frequency of Social Gatherings with Friends and Family: Once a week    Attends Religious Services: More than 4 times per year    Active Member of [employer] or Organizations: Yes    Attends Archivist Meetings: More than 4 times per year    Marital Status: Widowed  Intimate Partner Violence: Not At Risk (10/19/2017)   Humiliation, Afraid, Rape, and Kick questionnaire    Fear of Current or Ex-Partner: No    Emotionally Abused: No    Physically Abused: No    Sexually Abused: No    FAMILY HISTORY:  Family History  Problem Relation Age of Onset   Tuberculosis Mother    Kidney failure Father    Hypertension Paternal Aunt    Diabetes Paternal Aunt    Hypertension Paternal Uncle    Diabetes Paternal Uncle    Hypertension Daughter    Stroke Daughter     CURRENT MEDICATIONS:  Current Outpatient Medications  Medication Sig Dispense Refill   acetaminophen (TYLENOL) 500 MG tablet Take by mouth.     amLODipine (NORVASC) 5 MG tablet Take 5 mg by mouth daily.      calcium carbonate (OSCAL) 1500 (600 Ca) MG TABS tablet Take by mouth.     hydrochlorothiazide (HYDRODIURIL) 25 MG tablet Take 25 mg by mouth daily.     lenalidomide (REVLIMID) 10 MG capsule Take 1 capsule (10 mg total) by mouth daily. Take for 21 days, then  hold for 7 days. Repeat every 28 days. 21 capsule 0   LOW-DOSE ASPIRIN PO Asprin Ec Low Dose     Multiple Vitamins-Minerals (THERA-M) TABS Thera-M 9 mg iron-400 mcg tablet     polyethylene glycol (MIRALAX / GLYCOLAX) packet Take 17 g by mouth as needed.      potassium chloride SA (KLOR-CON M) 20 MEQ tablet TAKE 1 TABLET TWICE DAILY 180 tablet 2   vitamin B-12 (CYANOCOBALAMIN) 1000 MCG tablet Vitamin B12     ondansetron (ZOFRAN) 4 MG tablet TAKE 1 TABLET BY MOUTH EVERY 8 HOURS AS NEEDED FOR NAUSEA AND VOMITING (Patient not taking: Reported on 09/15/2021)     No current facility-administered medications for this visit.    ALLERGIES:  No  Known Allergies  PHYSICAL EXAM:  Performance status (ECOG): 1 - Symptomatic but completely ambulatory  Vitals:   09/15/21 1302  BP: 134/70  Pulse: 76  Resp: 18  Temp: 97.6 F (36.4 C)  SpO2: 100%   Wt Readings from Last 3 Encounters:  09/15/21 158 lb 8 oz (71.9 kg)  06/23/21 156 lb 1.4 oz (70.8 kg)  04/01/21 161 lb 3.2 oz (73.1 kg)   Physical Exam Vitals reviewed.  Constitutional:      Appearance: Normal appearance.  Cardiovascular:     Rate and Rhythm: Normal rate and regular rhythm.     Pulses: Normal pulses.     Heart sounds: Normal heart sounds.  Pulmonary:     Effort: Pulmonary effort is normal.     Breath sounds: Normal breath sounds.  Neurological:     General: No focal deficit present.     Mental Status: She is alert and oriented to person, place, and time.  Psychiatric:        Mood and Affect: Mood normal.        Behavior: Behavior normal.      LABORATORY DATA:  I have reviewed the labs as listed.     Latest Ref Rng & Units 09/08/2021   12:57 PM 06/16/2021   12:56 PM 03/25/2021    1:32 PM  CBC  WBC 4.0 - 10.5 K/uL 3.5  5.3  4.5   Hemoglobin 12.0 - 15.0 g/dL 12.3  12.8  12.5   Hematocrit 36.0 - 46.0 % 36.8  38.6  38.2   Platelets 150 - 400 K/uL 184  183  204       Latest Ref Rng & Units 09/08/2021   12:57 PM 06/16/2021    12:56 PM 03/25/2021    1:32 PM  CMP  Glucose 70 - 99 mg/dL 100  106  107   BUN 8 - 23 mg/dL $Remove'11  20  19   'EVcYgxa$ Creatinine 0.44 - 1.00 mg/dL 0.93  1.01  0.93   Sodium 135 - 145 mmol/L 136  134  136   Potassium 3.5 - 5.1 mmol/L 3.9  3.4  3.6   Chloride 98 - 111 mmol/L 103  101  100   CO2 22 - 32 mmol/L $RemoveB'25  21  24   'uTcfcLBO$ Calcium 8.9 - 10.3 mg/dL 9.7  9.4  9.9   Total Protein 6.5 - 8.1 g/dL 8.3  8.1  7.9   Total Bilirubin 0.3 - 1.2 mg/dL 0.6  0.7  0.8   Alkaline Phos 38 - 126 U/L 47  45  49   AST 15 - 41 U/L $Remo'14  18  19   'rSwSs$ ALT 0 - 44 U/L $Remo'16  18  18     'jZVsc$ DIAGNOSTIC IMAGING:  I have independently reviewed the scans and discussed with the patient. No results found.   ASSESSMENT:  1.  Stage II IgA kappa multiple myeloma, standard risk: -XRT to the spine completed on November 10, 2017 following T9-T10 laminectomy with resection of epidural tumor. -5 cycles of RVD from 11/14/2017 through 02/19/2018 followed by stem cell transplant on April 03, 2018. -Revlimid maintenance therapy 10 mg 3 weeks on/1 week off started on August 24, 2018. -PET scan on 09/02/2019 showed redemonstrated bilateral cervical, right supraclavicular, right axillary hypermetabolic adenopathy with interval resolution of left axillary adenopathy.  Similar lytic and sclerotic lesions in bones with no corresponding FDG uptake.  Persistent increased FDG uptake in both palatine tonsils. -Reviewed myeloma panel from 09/26/2019.  SPEP and  immunofixation is negative.  Free light chain ratio is normal with kappa light chains elevated at 32.5. -Right neck lymph node biopsy on 10/21/2019 at [facility] was negative for malignancy.   2.  Myeloma bone disease: -Zometa was started on 11/21/2017, resumed after transplant on 09/06/2018.   PLAN:  1.  Stage II IgA kappa multiple myeloma, standard risk: - She is tolerating Revlimid maintenance very well. - Chronic diarrhea is stable. - Reviewed labs from 09/08/2021.  Calcium and creatinine normal.  SPEP  is negative.  Immunofixation shows polyclonal increase.  Free light chain ratio is 1.79 and kappa light chains 55.6.  Kappa light chains are slightly increased from 45. - Recommend continuing Revlimid 10 mg 3 weeks on/1 week off.  She is planning on a cruise in July of this year. - Recommend RTC in 3 months for follow-up with repeat myeloma labs.   2.  Myeloma bone disease: - Continue calcium daily.  Continue Zometa once every 12 weeks until December 2023.   3.  Thromboprophylaxis: - Continue aspirin 81 mg daily.   4.  Hypertension: - Continue amlodipine and HCTZ.  Blood pressure is 134/70.   5.  Peripheral neuropathy: - Tingling and numbness in the hands and feet has been stable.   Orders placed this encounter:  No orders of the defined types were placed in this encounter.    Derek Jack, MD [place] [phone]   I, Thana Ates, am acting as a scribe for Dr. Derek Jack.  I, Derek Jack MD, have reviewed the above documentation for accuracy and completeness, and I agree with the above.

## 2021-09-15 NOTE — Patient Instructions (Signed)
[facility]  Discharge Instructions: Thank you for choosing [facility] to provide your oncology and hematology care.  If you have a lab appointment with the [facility], please come in thru the Main Entrance and check in at the main information desk.  Wear comfortable clothing and clothing appropriate for easy access to any Portacath or PICC line.   We strive to give you quality time with your provider. You may need to reschedule your appointment if you arrive late (15 or more minutes).  Arriving late affects you and other patients whose appointments are after yours.  Also, if you miss three or more appointments without notifying the office, you may be dismissed from the clinic at the provider's discretion.      For prescription refill requests, have your pharmacy contact our office and allow 72 hours for refills to be completed.    Today you received the following chemotherapy and/or immunotherapy agents Zometa.  Zoledronic Acid Injection (Cancer) What is this medication? ZOLEDRONIC ACID (ZOE le dron ik AS id) treats high calcium levels in the blood caused by cancer. It may also be used with chemotherapy to treat weakened bones caused by cancer. It works by slowing down the release of calcium from bones. This lowers calcium levels in your blood. It also makes your bones stronger and less likely to break (fracture). It belongs to a group of medications called bisphosphonates. This medicine may be used for other purposes; ask your health care provider or pharmacist if you have questions. COMMON BRAND NAME(S): Zometa, Zometa Powder What should I tell my care team before I take this medication? They need to know if you have any of these conditions: Dehydration Dental disease Kidney disease Liver disease Low levels of calcium in the blood Lung or breathing disease, such as asthma Receiving steroids, such as dexamethasone or prednisone An unusual or allergic reaction  to zoledronic acid, other medications, foods, dyes, or preservatives Pregnant or trying to get pregnant Breast-feeding How should I use this medication? This medication is injected into a vein. It is given by your care team in a hospital or clinic setting. Talk to your care team about the use of this medication in children. Special care may be needed. Overdosage: If you think you have taken too much of this medicine contact a poison control center or emergency room at once. NOTE: This medicine is only for you. Do not share this medicine with others. What if I miss a dose? Keep appointments for follow-up doses. It is important not to miss your dose. Call your care team if you are unable to keep an appointment. What may interact with this medication? Certain antibiotics given by injection Diuretics, such as bumetanide, furosemide NSAIDs, medications for pain and inflammation, such as ibuprofen or naproxen Teriparatide Thalidomide This list may not describe all possible interactions. Give your health care provider a list of all the medicines, herbs, non-prescription drugs, or dietary supplements you use. Also tell them if you smoke, drink alcohol, or use illegal drugs. Some items may interact with your medicine. What should I watch for while using this medication? Visit your care team for regular checks on your progress. It may be some time before you see the benefit from this medication. Some people who take this medication have severe bone, joint, or muscle pain. This medication may also increase your risk for jaw problems or a broken thigh bone. Tell your care team right away if you have severe pain  in your jaw, bones, joints, or muscles. Tell you care team if you have any pain that does not go away or that gets worse. Tell your dentist and dental surgeon that you are taking this medication. You should not have major dental surgery while on this medication. See your dentist to have a dental exam  and fix any dental problems before starting this medication. Take good care of your teeth while on this medication. Make sure you see your dentist for regular follow-up appointments. You should make sure you get enough calcium and vitamin D while you are taking this medication. Discuss the foods you eat and the vitamins you take with your care team. Check with your care team if you have severe diarrhea, nausea, and vomiting, or if you sweat a lot. The loss of too much body fluid may make it dangerous for you to take this medication. You may need bloodwork while taking this medication. Talk to your care team if you wish to become pregnant or think you might be pregnant. This medication can cause serious birth defects. What side effects may I notice from receiving this medication? Side effects that you should report to your care team as soon as possible: Allergic reactions--skin rash, itching, hives, swelling of the face, lips, tongue, or throat Kidney injury--decrease in the amount of urine, swelling of the ankles, hands, or feet Low calcium level--muscle pain or cramps, confusion, tingling, or numbness in the hands or feet Osteonecrosis of the jaw--pain, swelling, or redness in the mouth, numbness of the jaw, poor healing after dental work, unusual discharge from the mouth, visible bones in the mouth Severe bone, joint, or muscle pain Side effects that usually do not require medical attention (report to your care team if they continue or are bothersome): Constipation Fatigue Fever Loss of appetite Nausea Stomach pain This list may not describe all possible side effects. Call your doctor for medical advice about side effects. You may report side effects to FDA at 1-800-FDA-1088. Where should I keep my medication? This medication is given in a hospital or clinic. It will not be stored at home. NOTE: This sheet is a summary. It may not cover all possible information. If you have questions about  this medicine, talk to your doctor, pharmacist, or health care provider.  2023 Elsevier/Gold Standard (2021-04-30 00:00:00)       To help prevent nausea and vomiting after your treatment, we encourage you to take your nausea medication as directed.  BELOW ARE SYMPTOMS THAT SHOULD BE REPORTED IMMEDIATELY: *FEVER GREATER THAN 100.4 F (38 C) OR HIGHER *CHILLS OR SWEATING *NAUSEA AND VOMITING THAT IS NOT CONTROLLED WITH YOUR NAUSEA MEDICATION *UNUSUAL SHORTNESS OF BREATH *UNUSUAL BRUISING OR BLEEDING *URINARY PROBLEMS (pain or burning when urinating, or frequent urination) *BOWEL PROBLEMS (unusual diarrhea, constipation, pain near the anus) TENDERNESS IN MOUTH AND THROAT WITH OR WITHOUT PRESENCE OF ULCERS (sore throat, sores in mouth, or a toothache) UNUSUAL RASH, SWELLING OR PAIN  UNUSUAL VAGINAL DISCHARGE OR ITCHING   Items with * indicate a potential emergency and should be followed up as soon as possible or go to the Emergency Department if any problems should occur.  Please show the CHEMOTHERAPY ALERT CARD or IMMUNOTHERAPY ALERT CARD at check-in to the Emergency Department and triage nurse.  Should you have questions after your visit or need to cancel or reschedule your appointment, please contact [facility] [phone]  and follow the prompts.  Office hours are 8:00 a.m. to 4:30 p.m.  Monday - Friday. Please note that voicemails left after 4:00 p.m. may not be returned until the following business day.  We are closed weekends and major holidays. You have access to a nurse at all times for urgent questions. Please call the main number to the clinic [phone] and follow the prompts.  For any non-urgent questions, you may also contact your provider using MyChart. We now offer e-Visits for anyone 8 and older to request care online for non-urgent symptoms. For details visit [email or web address].   Also download the MyChart app! Go to the app store, search "MyChart", open  the app, select [facility], and log in with your MyChart username and password.  Masks are optional in the cancer centers. If you would like for your care team to wear a mask while they are taking care of you, please let them know. For doctor visits, patients may have with them one support person who is at least 80 years old. At this time, visitors are not allowed in the infusion area.

## 2021-09-15 NOTE — Progress Notes (Signed)
Patient is taking Revlimid as prescribed.  She has not missed any doses and reports no side effects at this time.   

## 2021-09-15 NOTE — Progress Notes (Signed)
Zometa given today per MD orders. Tolerated infusion without adverse affects. Vital signs stable. No complaints at this time. Discharged from clinic ambulatory in stable condition. Alert and oriented x 3. F/U with [facility] as scheduled.

## 2021-10-04 ENCOUNTER — Other Ambulatory Visit (HOSPITAL_COMMUNITY): Payer: Self-pay

## 2021-10-04 DIAGNOSIS — C9001 Multiple myeloma in remission: Secondary | ICD-10-CM

## 2021-10-04 MED ORDER — LENALIDOMIDE 10 MG PO CAPS: 10.0000 mg | ORAL_CAPSULE | Freq: Every day | ORAL | 0 refills | Status: DC

## 2021-10-04 NOTE — Telephone Encounter (Signed)
Chart reviewed. Revlimid refilled per last office note with Dr. Katragadda.  

## 2021-11-01 ENCOUNTER — Other Ambulatory Visit: Payer: Self-pay

## 2021-11-01 DIAGNOSIS — C9001 Multiple myeloma in remission: Secondary | ICD-10-CM

## 2021-11-01 MED ORDER — LENALIDOMIDE 10 MG PO CAPS: 10.0000 mg | ORAL_CAPSULE | Freq: Every day | ORAL | 0 refills | Status: DC

## 2021-11-01 NOTE — Telephone Encounter (Signed)
Chart reviewed. Revlimid refilled per last office note with Dr. Katragadda.  

## 2021-11-18 ENCOUNTER — Other Ambulatory Visit (HOSPITAL_COMMUNITY): Payer: Self-pay

## 2021-11-29 ENCOUNTER — Other Ambulatory Visit: Payer: Self-pay

## 2021-11-29 DIAGNOSIS — C9001 Multiple myeloma in remission: Secondary | ICD-10-CM

## 2021-11-29 MED ORDER — LENALIDOMIDE 10 MG PO CAPS: 10.0000 mg | ORAL_CAPSULE | Freq: Every day | ORAL | 0 refills | Status: DC

## 2021-11-29 NOTE — Telephone Encounter (Signed)
Chart reviewed. Revlimid refilled per last office note with Dr. Katragadda.  

## 2021-12-09 ENCOUNTER — Inpatient Hospital Stay: Payer: Medicare HMO | Attending: Hematology

## 2021-12-09 DIAGNOSIS — C9000 Multiple myeloma not having achieved remission: Secondary | ICD-10-CM | POA: Diagnosis not present

## 2021-12-09 DIAGNOSIS — C9001 Multiple myeloma in remission: Secondary | ICD-10-CM

## 2021-12-09 LAB — CBC WITH DIFFERENTIAL/PLATELET
Abs Immature Granulocytes: 0 10*3/uL (ref 0.00–0.07)
Basophils Absolute: 0 10*3/uL (ref 0.0–0.1)
Basophils Relative: 0 %
Eosinophils Absolute: 0.1 10*3/uL (ref 0.0–0.5)
Eosinophils Relative: 1 %
HCT: 36.4 % (ref 36.0–46.0)
Hemoglobin: 12.3 g/dL (ref 12.0–15.0)
Immature Granulocytes: 0 %
Lymphocytes Relative: 43 %
Lymphs Abs: 1.5 10*3/uL (ref 0.7–4.0)
MCH: 34.8 pg — ABNORMAL HIGH (ref 26.0–34.0)
MCHC: 33.8 g/dL (ref 30.0–36.0)
MCV: 103.1 fL — ABNORMAL HIGH (ref 80.0–100.0)
Monocytes Absolute: 0.5 10*3/uL (ref 0.1–1.0)
Monocytes Relative: 15 %
Neutro Abs: 1.5 10*3/uL — ABNORMAL LOW (ref 1.7–7.7)
Neutrophils Relative %: 41 %
Platelets: 215 10*3/uL (ref 150–400)
RBC: 3.53 MIL/uL — ABNORMAL LOW (ref 3.87–5.11)
RDW: 13.6 % (ref 11.5–15.5)
WBC: 3.6 10*3/uL — ABNORMAL LOW (ref 4.0–10.5)
nRBC: 0 % (ref 0.0–0.2)

## 2021-12-09 LAB — COMPREHENSIVE METABOLIC PANEL
ALT: 17 U/L (ref 0–44)
AST: 16 U/L (ref 15–41)
Albumin: 3.9 g/dL (ref 3.5–5.0)
Alkaline Phosphatase: 49 U/L (ref 38–126)
Anion gap: 8 (ref 5–15)
BUN: 16 mg/dL (ref 8–23)
CO2: 23 mmol/L (ref 22–32)
Calcium: 9.4 mg/dL (ref 8.9–10.3)
Chloride: 105 mmol/L (ref 98–111)
Creatinine, Ser: 0.97 mg/dL (ref 0.44–1.00)
GFR, Estimated: 59 mL/min — ABNORMAL LOW (ref >60)
Glucose, Bld: 132 mg/dL — ABNORMAL HIGH (ref 70–99)
Potassium: 3.7 mmol/L (ref 3.5–5.1)
Sodium: 136 mmol/L (ref 135–145)
Total Bilirubin: 0.7 mg/dL (ref 0.3–1.2)
Total Protein: 7.7 g/dL (ref 6.5–8.1)

## 2021-12-09 LAB — MAGNESIUM: Magnesium: 1.6 mg/dL — ABNORMAL LOW (ref 1.7–2.4)

## 2021-12-09 LAB — LACTATE DEHYDROGENASE: LDH: 112 U/L (ref 98–192)

## 2021-12-13 LAB — IMMUNOFIXATION ELECTROPHORESIS
IgA: 843 mg/dL — ABNORMAL HIGH (ref 64–422)
IgG (Immunoglobin G), Serum: 1131 mg/dL (ref 586–1602)
IgM (Immunoglobulin M), Srm: 17 mg/dL — ABNORMAL LOW (ref 26–217)
Total Protein ELP: 7.1 g/dL (ref 6.0–8.5)

## 2021-12-13 LAB — KAPPA/LAMBDA LIGHT CHAINS
Kappa free light chain: 32.4 mg/L — ABNORMAL HIGH (ref 3.3–19.4)
Kappa, lambda light chain ratio: 1.5 (ref 0.26–1.65)
Lambda free light chains: 21.6 mg/L (ref 5.7–26.3)

## 2021-12-15 LAB — PROTEIN ELECTROPHORESIS, SERUM
A/G Ratio: 1 (ref 0.7–1.7)
Albumin ELP: 2.7 g/dL — ABNORMAL LOW (ref 2.9–4.4)
Alpha-1-Globulin: 0.2 g/dL (ref 0.0–0.4)
Alpha-2-Globulin: 0.6 g/dL (ref 0.4–1.0)
Beta Globulin: 1.2 g/dL (ref 0.7–1.3)
Gamma Globulin: 0.7 g/dL (ref 0.4–1.8)
Globulin, Total: 2.7 g/dL (ref 2.2–3.9)
Total Protein ELP: 5.4 g/dL — ABNORMAL LOW (ref 6.0–8.5)

## 2021-12-16 ENCOUNTER — Inpatient Hospital Stay: Payer: Medicare HMO

## 2021-12-16 ENCOUNTER — Inpatient Hospital Stay (HOSPITAL_BASED_OUTPATIENT_CLINIC_OR_DEPARTMENT_OTHER): Payer: Medicare HMO | Admitting: Hematology

## 2021-12-16 VITALS — BP 128/51 | HR 81 | Temp 97.9°F | Resp 18

## 2021-12-16 VITALS — BP 152/72 | HR 82 | Temp 98.4°F | Resp 18 | Wt 162.4 lb

## 2021-12-16 DIAGNOSIS — C9001 Multiple myeloma in remission: Secondary | ICD-10-CM

## 2021-12-16 DIAGNOSIS — C9000 Multiple myeloma not having achieved remission: Secondary | ICD-10-CM

## 2021-12-16 MED ORDER — SODIUM CHLORIDE 0.9 % IV SOLN: Freq: Once | INTRAVENOUS | Status: AC

## 2021-12-16 MED ORDER — GABAPENTIN 300 MG PO CAPS: 300.0000 mg | ORAL_CAPSULE | Freq: Two times a day (BID) | ORAL | 3 refills | Status: DC

## 2021-12-16 MED ORDER — ZOLEDRONIC ACID 4 MG/100ML IV SOLN
4.0000 mg | Freq: Once | INTRAVENOUS | Status: AC
  Administered 2021-12-16: 4 mg via INTRAVENOUS
  Filled 2021-12-16: qty 100

## 2021-12-16 NOTE — Progress Notes (Signed)
Patient presents today for follow up visit with Dr. Delton Coombes and Zometa infusion. Vital signs stable. Patient had no complaints of any changes since her last visit. Creatinine 0.97. Calcium 9.4. Patient taking OSCAL 1500 mg tablet daily.   Zometa given today per MD orders. Tolerated infusion without adverse affects. Vital signs stable. No complaints at this time. Discharged from clinic ambulatory in stable condition. Alert and oriented x 3. F/U with [facility] as scheduled.

## 2021-12-16 NOTE — Progress Notes (Signed)
[place], [place] [address]   CLINIC:  Medical Oncology/Hematology  PCP:  Asencion Noble, MD [address] / [place] [address] [phone]   REASON FOR VISIT:  Follow-up for multiple myeloma  PRIOR THERAPY:  1. Radiation through 11/10/2017. 2. RVD x 5 cycles from 11/14/2017 to 02/19/2018. 3. Stem cell transplant on 04/03/2018.  NGS Results: not done  CURRENT THERAPY: Maintenance Revlimid 10 mg 3/4 weeks  BRIEF ONCOLOGIC HISTORY:  Oncology History  Multiple myeloma ([facility])  10/10/2017 Initial Diagnosis   Multiple myeloma ([facility])   11/14/2017 - 02/26/2018 Chemotherapy   The patient had dexamethasone (DECADRON) tablet 40 mg, 40 mg (100 % of original dose 40 mg), Oral,  Once, 1 of 1 cycle Dose modification: 40 mg (original dose 40 mg, Cycle 1) Administration: 40 mg (11/14/2017) dexamethasone (DECADRON) 4 MG tablet, 1 of 1 cycle, Start date: 11/02/2017, End date: 03/12/2018 lenalidomide (REVLIMID) 25 MG capsule, 1 of 1 cycle, Start date: 02/28/2018, End date: 03/12/2018 bortezomib SQ (VELCADE) chemo injection 2.5 mg, 1.3 mg/m2 = 2.5 mg, Subcutaneous,  Once, 5 of 6 cycles Administration: 2.5 mg (11/14/2017), 2.5 mg (11/21/2017), 2.5 mg (11/28/2017), 2.5 mg (12/14/2017), 2.5 mg (12/21/2017), 2.5 mg (12/28/2017), 2.5 mg (01/04/2018), 2.5 mg (01/11/2018), 2.5 mg (01/18/2018), 2.5 mg (01/25/2018), 2.5 mg (02/01/2018), 2.5 mg (02/08/2018), 2.5 mg (02/19/2018), 2.5 mg (02/26/2018)  for chemotherapy treatment.      CANCER STAGING:  Cancer Staging  No matching staging information was found for the patient.  INTERVAL HISTORY:  [name], a 81 y.o. female, seen for follow-up of multiple myeloma.  She is taking Revlimid 10 mg 3 weeks on/1 week off.  She reports on and off for diarrhea which is manageable.  She reports pain in her thighs and left groin since a bone marrow transplant.  Rated as 5 out of 10.  She applies heating pads at nighttime.   No infections reported.  Numbness in the extremities has been stable.  REVIEW OF SYSTEMS:  Review of Systems  Constitutional:  Negative for appetite change.  Respiratory:  Negative for cough.   Gastrointestinal:  Positive for diarrhea (improving).  Neurological:  Positive for numbness (stable).  All other systems reviewed and are negative.   PAST MEDICAL/SURGICAL HISTORY:  Past Medical History:  Diagnosis Date   Back pain    Hypercholesteremia    Hypertension    Past Surgical History:  Procedure Laterality Date   BONE MARROW BIOPSY     LAMINECTOMY N/A 10/06/2017   Procedure: THORACIC NINE AND TEN LAMINECTOMY WITH RESECTION OF TUMOR, THORACIC EIGHT TO THORACIC ELEVEN FUSION WITH PEDICLE SCREW FIXATION;  Surgeon: Earnie Larsson, MD;  Location: [facility];  Service: Neurosurgery;  Laterality: N/A;   REPLACEMENT TOTAL KNEE Left 2003   TOTAL KNEE ARTHROPLASTY  2001    SOCIAL HISTORY:  Social History   Socioeconomic History   Marital status: Widowed    Spouse name: Not on file   Number of children: 2   Years of education: Not on file   Highest education level: Not on file  Occupational History   Not on file  Tobacco Use   Smoking status: Former    Packs/day: 0.50    Years: 30.00    Total pack years: 15.00    Types: Cigarettes    Quit date: 05/31/2006    Years since quitting: 15.5   Smokeless tobacco: Never  Vaping Use   Vaping Use: Never used  Substance  and Sexual Activity   Alcohol use: Yes    Comment: wine occassionally   Drug use: No   Sexual activity: Not on file  Other Topics Concern   Not on file  Social History Narrative   Not on file   Social Determinants of Health   Financial Resource Strain: Low Risk  (10/19/2017)   Overall Financial Resource Strain (CARDIA)    Difficulty of Paying Living Expenses: Not very hard  Food Insecurity: No Food Insecurity (10/19/2017)   Hunger Vital Sign    Worried About Running Out of Food in the Last Year: Never true    Ran Out of  Food in the Last Year: Never true  Transportation Needs: No Transportation Needs (10/19/2017)   PRAPARE - Hydrologist (Medical): No    Lack of Transportation (Non-Medical): No  Physical Activity: Sufficiently Active (10/19/2017)   Exercise Vital Sign    Days of Exercise per Week: 4 days    Minutes of Exercise per Session: 120 min  Stress: No Stress Concern Present (10/19/2017)   [place]    Feeling of Stress : Not at all  Social Connections: Moderately Integrated (10/19/2017)   Social Connection and Isolation Panel [NHANES]    Frequency of Communication with Friends and Family: More than three times a week    Frequency of Social Gatherings with Friends and Family: Once a week    Attends Religious Services: More than 4 times per year    Active Member of [employer] or Organizations: Yes    Attends Archivist Meetings: More than 4 times per year    Marital Status: Widowed  Intimate Partner Violence: Not At Risk (10/19/2017)   Humiliation, Afraid, Rape, and Kick questionnaire    Fear of Current or Ex-Partner: No    Emotionally Abused: No    Physically Abused: No    Sexually Abused: No    FAMILY HISTORY:  Family History  Problem Relation Age of Onset   Tuberculosis Mother    Kidney failure Father    Hypertension Paternal Aunt    Diabetes Paternal Aunt    Hypertension Paternal Uncle    Diabetes Paternal Uncle    Hypertension Daughter    Stroke Daughter     CURRENT MEDICATIONS:  Current Outpatient Medications  Medication Sig Dispense Refill   acetaminophen (TYLENOL) 500 MG tablet Take by mouth.     amLODipine (NORVASC) 5 MG tablet Take 5 mg by mouth daily.      calcium carbonate (OSCAL) 1500 (600 Ca) MG TABS tablet Take by mouth.     hydrochlorothiazide (HYDRODIURIL) 25 MG tablet Take 25 mg by mouth daily.     lenalidomide (REVLIMID) 10 MG capsule Take 1 capsule (10 mg total) by mouth  daily. Take for 21 days, then hold for 7 days. Repeat every 28 days. 21 capsule 0   LOW-DOSE ASPIRIN PO Asprin Ec Low Dose     Multiple Vitamins-Minerals (THERA-M) TABS Thera-M 9 mg iron-400 mcg tablet     ondansetron (ZOFRAN) 4 MG tablet TAKE 1 TABLET BY MOUTH EVERY 8 HOURS AS NEEDED FOR NAUSEA AND VOMITING (Patient not taking: Reported on 09/15/2021)     polyethylene glycol (MIRALAX / GLYCOLAX) packet Take 17 g by mouth as needed.      potassium chloride SA (KLOR-CON M) 20 MEQ tablet TAKE 1 TABLET TWICE DAILY 180 tablet 2   vitamin B-12 (CYANOCOBALAMIN) 1000 MCG tablet Vitamin B12  No current facility-administered medications for this visit.    ALLERGIES:  No Known Allergies  PHYSICAL EXAM:  Performance status (ECOG): 1 - Symptomatic but completely ambulatory  There were no vitals filed for this visit.  Wt Readings from Last 3 Encounters:  09/15/21 158 lb 8 oz (71.9 kg)  06/23/21 156 lb 1.4 oz (70.8 kg)  04/01/21 161 lb 3.2 oz (73.1 kg)   Physical Exam Vitals reviewed.  Constitutional:      Appearance: Normal appearance.  Cardiovascular:     Rate and Rhythm: Normal rate and regular rhythm.     Pulses: Normal pulses.     Heart sounds: Normal heart sounds.  Pulmonary:     Effort: Pulmonary effort is normal.     Breath sounds: Normal breath sounds.  Neurological:     General: No focal deficit present.     Mental Status: She is alert and oriented to person, place, and time.  Psychiatric:        Mood and Affect: Mood normal.        Behavior: Behavior normal.     LABORATORY DATA:  I have reviewed the labs as listed.     Latest Ref Rng & Units 12/09/2021    1:50 PM 09/08/2021   12:57 PM 06/16/2021   12:56 PM  CBC  WBC 4.0 - 10.5 K/uL 3.6  3.5  5.3   Hemoglobin 12.0 - 15.0 g/dL 12.3  12.3  12.8   Hematocrit 36.0 - 46.0 % 36.4  36.8  38.6   Platelets 150 - 400 K/uL 215  184  183       Latest Ref Rng & Units 12/09/2021    1:50 PM 09/08/2021   12:57 PM 06/16/2021    12:56 PM  CMP  Glucose 70 - 99 mg/dL 132  100  106   BUN 8 - 23 mg/dL _0 Creatinine 0.44 - 1.00 mg/dL 0.97  0.93  1.01   Sodium 135 - 145 mmol/L 136  136  134   Potassium 3.5 - 5.1 mmol/L 3.7  3.9  3.4   Chloride 98 - 111 mmol/L 105  103  101   CO2 22 - 32 mmol/L _1 Calcium 8.9 - 10.3 mg/dL 9.4  9.7  9.4   Total Protein 6.5 - 8.1 g/dL 7.7  8.3  8.1   Total Bilirubin 0.3 - 1.2 mg/dL 0.7  0.6  0.7   Alkaline Phos 38 - 126 U/L 49  47  45   AST 15 - 41 U/L _2 ALT 0 - 44 U/L _3 DIAGNOSTIC IMAGING:  I have independently reviewed the scans and discussed with the patient. No results found.   ASSESSMENT:  1.  Stage II IgA kappa multiple myeloma, standard risk: -XRT to the spine completed on November 10, 2017 following T9-T10 laminectomy with resection of epidural tumor. -5 cycles of RVD from 11/14/2017 through 02/19/2018 followed by stem cell transplant on April 03, 2018. -Revlimid maintenance therapy 10 mg 3 weeks on/1 week off started on August 24, 2018. -PET scan on 09/02/2019 showed redemonstrated bilateral cervical, right supraclavicular, right axillary hypermetabolic adenopathy with interval resolution of left axillary adenopathy.  Similar lytic and sclerotic lesions in bones with no corresponding FDG uptake.  Persistent increased FDG uptake in both palatine tonsils. -Reviewed myeloma panel from 09/26/2019.  SPEP and immunofixation is negative.  Free light chain ratio is normal with kappa light chains elevated at 32.5. -Right neck lymph node biopsy on 10/21/2019 at [facility] was negative for malignancy.   2.  Myeloma bone disease: -Zometa was started on 11/21/2017, resumed after transplant on 09/06/2018.   PLAN:  1.  Stage II IgA kappa multiple myeloma, standard risk: -Myeloma panel from 12/09/2021 showed M spike not detected.  Free light chain ratio is 1.5 with kappa light chains 32.4.  Immunofixation was negative. - Reviewed labs today  which shows mild hypomagnesemia.  LFTs and creatinine normal.  CBC shows mild leukopenia which is stable.  IgG level is normal. - Continue Revlimid maintenance 10 mg 3 weeks on/1 week off. - She was instructed to take COVID booster shot and flu shot. - RTC 3 months with repeat myeloma labs.   2.  Myeloma bone disease: - Continue Zometa every 12 weeks until December 2023.  Calcium is 9.4.  She will receive Zometa today and in 3 months.  I plan to stop it after next dose as she has received it for approximately 3 years.   3.  Thromboprophylaxis: - Continue aspirin 81 mg daily.   4.  Hypertension: - Continue amlodipine and HCTZ.   5.  Peripheral neuropathy: - Tingling and numbness in the hands and feet has been stable. - She complains of pains in her thighs, and left groin since bone marrow transplant.  She is applying heating pad at nighttime.  I will start her on gabapentin 300 mg twice daily and see if it helps.   Orders placed this encounter:  No orders of the defined types were placed in this encounter.    Sreedhar Katragadda, MD [facility] [phone]       

## 2021-12-16 NOTE — Patient Instructions (Addendum)
[facility]  Discharge Instructions  You were seen and examined today by Dr. Delton Coombes.  Your labs are stable. Continue taking Revlimid as prescribed.  Proceed with Zometa today as planned today and again in December. December will be your final infusion. If the Myeloma relapses, you will have to restart Zometa but at this point, you will no longer need additional Zometa.  For your leg pains and neuropathy, Dr. Delton Coombes discussed starting Gabapentin. He has prescribed '300mg'$  twice a daily.  Follow-up as scheduled.  Thank you for choosing [facility] to provide your oncology and hematology care.   To afford each patient quality time with our provider, please arrive at least 15 minutes before your scheduled appointment time. You may need to reschedule your appointment if you arrive late (10 or more minutes). Arriving late affects you and other patients whose appointments are after yours.  Also, if you miss three or more appointments without notifying the office, you may be dismissed from the clinic at the provider's discretion.    Again, thank you for choosing [facility].  Our hope is that these requests will decrease the amount of time that you wait before being seen by our physicians.   If you have a lab appointment with the [facility] please come in thru the Main Entrance and check in at the main information desk.           _____________________________________________________________  Should you have questions after your visit to [facility], please contact our office at [phone] and follow the prompts.  Our office hours are 8:00 a.m. to 4:30 p.m. Monday - Thursday and 8:00 a.m. to 2:30 p.m. Friday.  Please note that voicemails left after 4:00 p.m. may not be returned until the following business day.  We are closed weekends and all major holidays.  You do have access to a nurse 24-7, just call the  main number to the clinic [phone] and do not press any options, hold on the line and a nurse will answer the phone.    For prescription refill requests, have your pharmacy contact our office and allow 72 hours.    Masks are optional in the cancer centers. If you would like for your care team to wear a mask while they are taking care of you, please let them know. You may have one support person who is at least 80 years old accompany you for your appointments.

## 2021-12-16 NOTE — Patient Instructions (Signed)
[facility]  Discharge Instructions: Thank you for choosing [facility] to provide your oncology and hematology care.  If you have a lab appointment with the [facility], please come in thru the Main Entrance and check in at the main information desk.  Wear comfortable clothing and clothing appropriate for easy access to any Portacath or PICC line.   We strive to give you quality time with your provider. You may need to reschedule your appointment if you arrive late (15 or more minutes).  Arriving late affects you and other patients whose appointments are after yours.  Also, if you miss three or more appointments without notifying the office, you may be dismissed from the clinic at the provider's discretion.      For prescription refill requests, have your pharmacy contact our office and allow 72 hours for refills to be completed.    Today you received the following chemotherapy and/or immunotherapy agents Zometa.  Zoledronic Acid Injection (Cancer) What is this medication? ZOLEDRONIC ACID (ZOE le dron ik AS id) treats high calcium levels in the blood caused by cancer. It may also be used with chemotherapy to treat weakened bones caused by cancer. It works by slowing down the release of calcium from bones. This lowers calcium levels in your blood. It also makes your bones stronger and less likely to break (fracture). It belongs to a group of medications called bisphosphonates. This medicine may be used for other purposes; ask your health care provider or pharmacist if you have questions. COMMON BRAND NAME(S): Zometa, Zometa Powder What should I tell my care team before I take this medication? They need to know if you have any of these conditions: Dehydration Dental disease Kidney disease Liver disease Low levels of calcium in the blood Lung or breathing disease, such as asthma Receiving steroids, such as dexamethasone or prednisone An unusual or allergic  reaction to zoledronic acid, other medications, foods, dyes, or preservatives Pregnant or trying to get pregnant Breast-feeding How should I use this medication? This medication is injected into a vein. It is given by your care team in a hospital or clinic setting. Talk to your care team about the use of this medication in children. Special care may be needed. Overdosage: If you think you have taken too much of this medicine contact a poison control center or emergency room at once. NOTE: This medicine is only for you. Do not share this medicine with others. What if I miss a dose? Keep appointments for follow-up doses. It is important not to miss your dose. Call your care team if you are unable to keep an appointment. What may interact with this medication? Certain antibiotics given by injection Diuretics, such as bumetanide, furosemide NSAIDs, medications for pain and inflammation, such as ibuprofen or naproxen Teriparatide Thalidomide This list may not describe all possible interactions. Give your health care provider a list of all the medicines, herbs, non-prescription drugs, or dietary supplements you use. Also tell them if you smoke, drink alcohol, or use illegal drugs. Some items may interact with your medicine. What should I watch for while using this medication? Visit your care team for regular checks on your progress. It may be some time before you see the benefit from this medication. Some people who take this medication have severe bone, joint, or muscle pain. This medication may also increase your risk for jaw problems or a broken thigh bone. Tell your care team right away if you have severe  pain in your jaw, bones, joints, or muscles. Tell you care team if you have any pain that does not go away or that gets worse. Tell your dentist and dental surgeon that you are taking this medication. You should not have major dental surgery while on this medication. See your dentist to have a  dental exam and fix any dental problems before starting this medication. Take good care of your teeth while on this medication. Make sure you see your dentist for regular follow-up appointments. You should make sure you get enough calcium and vitamin D while you are taking this medication. Discuss the foods you eat and the vitamins you take with your care team. Check with your care team if you have severe diarrhea, nausea, and vomiting, or if you sweat a lot. The loss of too much body fluid may make it dangerous for you to take this medication. You may need bloodwork while taking this medication. Talk to your care team if you wish to become pregnant or think you might be pregnant. This medication can cause serious birth defects. What side effects may I notice from receiving this medication? Side effects that you should report to your care team as soon as possible: Allergic reactions--skin rash, itching, hives, swelling of the face, lips, tongue, or throat Kidney injury--decrease in the amount of urine, swelling of the ankles, hands, or feet Low calcium level--muscle pain or cramps, confusion, tingling, or numbness in the hands or feet Osteonecrosis of the jaw--pain, swelling, or redness in the mouth, numbness of the jaw, poor healing after dental work, unusual discharge from the mouth, visible bones in the mouth Severe bone, joint, or muscle pain Side effects that usually do not require medical attention (report to your care team if they continue or are bothersome): Constipation Fatigue Fever Loss of appetite Nausea Stomach pain This list may not describe all possible side effects. Call your doctor for medical advice about side effects. You may report side effects to FDA at 1-800-FDA-1088. Where should I keep my medication? This medication is given in a hospital or clinic. It will not be stored at home. NOTE: This sheet is a summary. It may not cover all possible information. If you have  questions about this medicine, talk to your doctor, pharmacist, or health care provider.  2023 Elsevier/Gold Standard (2021-04-22 00:00:00)       To help prevent nausea and vomiting after your treatment, we encourage you to take your nausea medication as directed.  BELOW ARE SYMPTOMS THAT SHOULD BE REPORTED IMMEDIATELY: *FEVER GREATER THAN 100.4 F (38 C) OR HIGHER *CHILLS OR SWEATING *NAUSEA AND VOMITING THAT IS NOT CONTROLLED WITH YOUR NAUSEA MEDICATION *UNUSUAL SHORTNESS OF BREATH *UNUSUAL BRUISING OR BLEEDING *URINARY PROBLEMS (pain or burning when urinating, or frequent urination) *BOWEL PROBLEMS (unusual diarrhea, constipation, pain near the anus) TENDERNESS IN MOUTH AND THROAT WITH OR WITHOUT PRESENCE OF ULCERS (sore throat, sores in mouth, or a toothache) UNUSUAL RASH, SWELLING OR PAIN  UNUSUAL VAGINAL DISCHARGE OR ITCHING   Items with * indicate a potential emergency and should be followed up as soon as possible or go to the Emergency Department if any problems should occur.  Please show the CHEMOTHERAPY ALERT CARD or IMMUNOTHERAPY ALERT CARD at check-in to the Emergency Department and triage nurse.  Should you have questions after your visit or need to cancel or reschedule your appointment, please contact [facility] [phone]  and follow the prompts.  Office hours are 8:00 a.m. to  4:30 p.m. Monday - Friday. Please note that voicemails left after 4:00 p.m. may not be returned until the following business day.  We are closed weekends and major holidays. You have access to a nurse at all times for urgent questions. Please call the main number to the clinic [phone] and follow the prompts.  For any non-urgent questions, you may also contact your provider using MyChart. We now offer e-Visits for anyone 61 and older to request care online for non-urgent symptoms. For details visit [email or web address].   Also download the MyChart app! Go to the app  store, search "MyChart", open the app, select [facility], and log in with your MyChart username and password.  Masks are optional in the cancer centers. If you would like for your care team to wear a mask while they are taking care of you, please let them know. You may have one support person who is at least 80 years old accompany you for your appointments.

## 2021-12-16 NOTE — Progress Notes (Signed)
Patient has been assessed, vital signs and labs have been reviewed by Dr. Katragadda. ANC, Creatinine, LFTs, and Platelets are within treatment parameters per Dr. Katragadda. The patient is good to proceed with treatment at this time. Primary RN and pharmacy aware.  

## 2021-12-28 ENCOUNTER — Other Ambulatory Visit: Payer: Self-pay

## 2021-12-28 DIAGNOSIS — C9001 Multiple myeloma in remission: Secondary | ICD-10-CM

## 2021-12-28 MED ORDER — LENALIDOMIDE 10 MG PO CAPS: 10.0000 mg | ORAL_CAPSULE | Freq: Every day | ORAL | 0 refills | Status: DC

## 2021-12-28 NOTE — Telephone Encounter (Signed)
Chart reviewed. Revlimid refilled per last office note with Dr. Katragadda.  

## 2022-01-21 ENCOUNTER — Other Ambulatory Visit: Payer: Self-pay

## 2022-01-21 DIAGNOSIS — C9001 Multiple myeloma in remission: Secondary | ICD-10-CM

## 2022-01-21 MED ORDER — LENALIDOMIDE 10 MG PO CAPS: 10.0000 mg | ORAL_CAPSULE | Freq: Every day | ORAL | 0 refills | Status: DC

## 2022-01-21 NOTE — Telephone Encounter (Signed)
Chart reviewed. Revlimid refilled per last office note with Dr. Katragadda.  

## 2022-01-24 DIAGNOSIS — Z124 Encounter for screening for malignant neoplasm of cervix: Secondary | ICD-10-CM | POA: Diagnosis not present

## 2022-01-24 DIAGNOSIS — Z1231 Encounter for screening mammogram for malignant neoplasm of breast: Secondary | ICD-10-CM | POA: Diagnosis not present

## 2022-01-24 DIAGNOSIS — Z6827 Body mass index (BMI) 27.0-27.9, adult: Secondary | ICD-10-CM | POA: Diagnosis not present

## 2022-01-28 ENCOUNTER — Other Ambulatory Visit (HOSPITAL_COMMUNITY): Payer: Self-pay

## 2022-01-28 ENCOUNTER — Telehealth: Payer: Self-pay

## 2022-01-28 ENCOUNTER — Encounter (HOSPITAL_COMMUNITY): Payer: Self-pay | Admitting: Hematology

## 2022-01-28 NOTE — Telephone Encounter (Signed)
Oral Oncology Patient Advocate Encounter  Was successful in securing patient a $12,000 grant from [facility] to provide copayment coverage for Revlimid.  This will keep the out of pocket expense at $0.     [employer] ID: [id number]  I have spoken with the patient.   The billing information is as follows and has been shared with [facility].    RxBin: [id number] PCN: [id number] Member ID: [id number] Group ID: [id number] Dates of Eligibility: 10.11.23 through 10.10.24  Fund:  Multiple Myeloma - Medicare [place], [facility] Patient [place]  [phone] (phone) [phone] (fax) 01/28/2022 12:25 PM

## 2022-02-16 ENCOUNTER — Other Ambulatory Visit: Payer: Self-pay

## 2022-02-16 DIAGNOSIS — C9001 Multiple myeloma in remission: Secondary | ICD-10-CM

## 2022-02-16 MED ORDER — LENALIDOMIDE 10 MG PO CAPS: 10.0000 mg | ORAL_CAPSULE | Freq: Every day | ORAL | 0 refills | Status: DC

## 2022-02-16 NOTE — Telephone Encounter (Signed)
Chart reviewed. Revlimid refilled per last office note with Dr. Delton Coombes.,s

## 2022-03-03 ENCOUNTER — Inpatient Hospital Stay: Payer: Medicare HMO | Attending: Hematology

## 2022-03-03 DIAGNOSIS — C9000 Multiple myeloma not having achieved remission: Secondary | ICD-10-CM | POA: Diagnosis not present

## 2022-03-03 DIAGNOSIS — C9001 Multiple myeloma in remission: Secondary | ICD-10-CM

## 2022-03-03 LAB — CBC WITH DIFFERENTIAL/PLATELET
Abs Immature Granulocytes: 0.01 10*3/uL (ref 0.00–0.07)
Basophils Absolute: 0 10*3/uL (ref 0.0–0.1)
Basophils Relative: 1 %
Eosinophils Absolute: 0.1 10*3/uL (ref 0.0–0.5)
Eosinophils Relative: 2 %
HCT: 36.9 % (ref 36.0–46.0)
Hemoglobin: 12.3 g/dL (ref 12.0–15.0)
Immature Granulocytes: 0 %
Lymphocytes Relative: 44 %
Lymphs Abs: 1.4 10*3/uL (ref 0.7–4.0)
MCH: 34.1 pg — ABNORMAL HIGH (ref 26.0–34.0)
MCHC: 33.3 g/dL (ref 30.0–36.0)
MCV: 102.2 fL — ABNORMAL HIGH (ref 80.0–100.0)
Monocytes Absolute: 0.5 10*3/uL (ref 0.1–1.0)
Monocytes Relative: 14 %
Neutro Abs: 1.3 10*3/uL — ABNORMAL LOW (ref 1.7–7.7)
Neutrophils Relative %: 39 %
Platelets: 224 10*3/uL (ref 150–400)
RBC: 3.61 MIL/uL — ABNORMAL LOW (ref 3.87–5.11)
RDW: 14 % (ref 11.5–15.5)
WBC: 3.3 10*3/uL — ABNORMAL LOW (ref 4.0–10.5)
nRBC: 0 % (ref 0.0–0.2)

## 2022-03-03 LAB — COMPREHENSIVE METABOLIC PANEL
ALT: 19 U/L (ref 0–44)
AST: 19 U/L (ref 15–41)
Albumin: 3.9 g/dL (ref 3.5–5.0)
Alkaline Phosphatase: 47 U/L (ref 38–126)
Anion gap: 10 (ref 5–15)
BUN: 21 mg/dL (ref 8–23)
CO2: 22 mmol/L (ref 22–32)
Calcium: 9.5 mg/dL (ref 8.9–10.3)
Chloride: 102 mmol/L (ref 98–111)
Creatinine, Ser: 1.15 mg/dL — ABNORMAL HIGH (ref 0.44–1.00)
GFR, Estimated: 48 mL/min — ABNORMAL LOW (ref >60)
Glucose, Bld: 102 mg/dL — ABNORMAL HIGH (ref 70–99)
Potassium: 3.7 mmol/L (ref 3.5–5.1)
Sodium: 134 mmol/L — ABNORMAL LOW (ref 135–145)
Total Bilirubin: 0.4 mg/dL (ref 0.3–1.2)
Total Protein: 8.1 g/dL (ref 6.5–8.1)

## 2022-03-03 LAB — MAGNESIUM: Magnesium: 1.7 mg/dL (ref 1.7–2.4)

## 2022-03-03 LAB — LACTATE DEHYDROGENASE: LDH: 109 U/L (ref 98–192)

## 2022-03-08 LAB — KAPPA/LAMBDA LIGHT CHAINS
Kappa free light chain: 40.6 mg/L — ABNORMAL HIGH (ref 3.3–19.4)
Kappa, lambda light chain ratio: 1.81 — ABNORMAL HIGH (ref 0.26–1.65)
Lambda free light chains: 22.4 mg/L (ref 5.7–26.3)

## 2022-03-09 LAB — PROTEIN ELECTROPHORESIS, SERUM
A/G Ratio: 1 (ref 0.7–1.7)
Albumin ELP: 3.9 g/dL (ref 2.9–4.4)
Alpha-1-Globulin: 0.2 g/dL (ref 0.0–0.4)
Alpha-2-Globulin: 0.8 g/dL (ref 0.4–1.0)
Beta Globulin: 1.6 g/dL — ABNORMAL HIGH (ref 0.7–1.3)
Gamma Globulin: 1.1 g/dL (ref 0.4–1.8)
Globulin, Total: 3.8 g/dL (ref 2.2–3.9)
Total Protein ELP: 7.7 g/dL (ref 6.0–8.5)

## 2022-03-10 ENCOUNTER — Inpatient Hospital Stay (HOSPITAL_BASED_OUTPATIENT_CLINIC_OR_DEPARTMENT_OTHER): Payer: Medicare HMO | Admitting: Hematology

## 2022-03-10 ENCOUNTER — Inpatient Hospital Stay: Payer: Medicare HMO

## 2022-03-10 VITALS — BP 134/68 | HR 62 | Temp 98.7°F | Resp 16 | Ht 64.5 in | Wt 160.1 lb

## 2022-03-10 VITALS — BP 133/65 | HR 73 | Temp 97.8°F | Resp 18

## 2022-03-10 DIAGNOSIS — C9001 Multiple myeloma in remission: Secondary | ICD-10-CM

## 2022-03-10 DIAGNOSIS — C9000 Multiple myeloma not having achieved remission: Secondary | ICD-10-CM

## 2022-03-10 MED ORDER — ZOLEDRONIC ACID 4 MG/100ML IV SOLN
4.0000 mg | Freq: Once | INTRAVENOUS | Status: AC
  Administered 2022-03-10: 4 mg via INTRAVENOUS
  Filled 2022-03-10: qty 100

## 2022-03-10 MED ORDER — SODIUM CHLORIDE 0.9 % IV SOLN: Freq: Once | INTRAVENOUS | Status: AC

## 2022-03-10 NOTE — Progress Notes (Signed)
Patient presents today for Zometa infusion per providers order.  Vital signs within parameters for treatment.  Message received from Anastasio Champion RN/Dr. Delton Coombes patient okay for treatment.    Peripheral IV started and blood return noted pre and post infusion.  Treatment given today per MD orders.  Stable during infusion without adverse affects.  Vital signs stable.  No complaints at this time.  Discharge from clinic ambulatory in stable condition.  Alert and oriented X 3.  Follow up with [facility] as scheduled.

## 2022-03-10 NOTE — Progress Notes (Signed)
Patient is taking Revlimid as prescribed.  She has not missed any doses and reports no side effects at this time.   

## 2022-03-10 NOTE — Progress Notes (Signed)
 [facility] [address] [place], [place] [address]   CLINIC:  Medical Oncology/Hematology  PCP:  Fagan, Roy, MD [address] / [place] [address] [phone]   REASON FOR VISIT:  Follow-up for multiple myeloma  PRIOR THERAPY:  1. Radiation through 11/10/2017. 2. RVD x 5 cycles from 11/14/2017 to 02/19/2018. 3. Stem cell transplant on 04/03/2018.  NGS Results: not done  CURRENT THERAPY: Maintenance Revlimid 10 mg 3/4 weeks  BRIEF ONCOLOGIC HISTORY:  Oncology History  Multiple myeloma (HCC)  10/10/2017 Initial Diagnosis   Multiple myeloma (HCC)   11/14/2017 - 02/26/2018 Chemotherapy   The patient had dexamethasone (DECADRON) tablet 40 mg, 40 mg (100 % of original dose 40 mg), Oral,  Once, 1 of 1 cycle Dose modification: 40 mg (original dose 40 mg, Cycle 1) Administration: 40 mg (11/14/2017) dexamethasone (DECADRON) 4 MG tablet, 1 of 1 cycle, Start date: 11/02/2017, End date: 03/12/2018 lenalidomide (REVLIMID) 25 MG capsule, 1 of 1 cycle, Start date: 02/28/2018, End date: 03/12/2018 bortezomib SQ (VELCADE) chemo injection 2.5 mg, 1.3 mg/m2 = 2.5 mg, Subcutaneous,  Once, 5 of 6 cycles Administration: 2.5 mg (11/14/2017), 2.5 mg (11/21/2017), 2.5 mg (11/28/2017), 2.5 mg (12/14/2017), 2.5 mg (12/21/2017), 2.5 mg (12/28/2017), 2.5 mg (01/04/2018), 2.5 mg (01/11/2018), 2.5 mg (01/18/2018), 2.5 mg (01/25/2018), 2.5 mg (02/01/2018), 2.5 mg (02/08/2018), 2.5 mg (02/19/2018), 2.5 mg (02/26/2018)  for chemotherapy treatment.      CANCER STAGING:  Cancer Staging  No matching staging information was found for the patient.  INTERVAL HISTORY:  [name], a 81 y.o. female, seen for follow-up of multiple myeloma.  She is taking Revlimid 10 mg 3 weeks on/1 week off.  Reports left hip pain 7 out of 10 in intensity.  Numbness in the hands and feet has been stable.  REVIEW OF SYSTEMS:  Review of Systems  Constitutional:  Negative for appetite change.   Respiratory:  Negative for cough.   Neurological:  Positive for numbness (stable).  All other systems reviewed and are negative.   PAST MEDICAL/SURGICAL HISTORY:  Past Medical History:  Diagnosis Date   Back pain    Hypercholesteremia    Hypertension    Past Surgical History:  Procedure Laterality Date   BONE MARROW BIOPSY     LAMINECTOMY N/A 10/06/2017   Procedure: THORACIC NINE AND TEN LAMINECTOMY WITH RESECTION OF TUMOR, THORACIC EIGHT TO THORACIC ELEVEN FUSION WITH PEDICLE SCREW FIXATION;  Surgeon: Pool, Henry, MD;  Location: [facility] OR;  Service: Neurosurgery;  Laterality: N/A;   REPLACEMENT TOTAL KNEE Left 2003   TOTAL KNEE ARTHROPLASTY  2001    SOCIAL HISTORY:  Social History   Socioeconomic History   Marital status: Widowed    Spouse name: Not on file   Number of children: 2   Years of education: Not on file   Highest education level: Not on file  Occupational History   Not on file  Tobacco Use   Smoking status: Former    Packs/day: 0.50    Years: 30.00    Total pack years: 15.00    Types: Cigarettes    Quit date: 05/31/2006    Years since quitting: 15.7   Smokeless tobacco: Never  Vaping Use   Vaping Use: Never used  Substance and Sexual Activity   Alcohol use: Yes    Comment: wine occassionally   Drug use: No   Sexual activity: Not on file  Other Topics Concern   Not on file  Social History   Narrative   Not on file   Social Determinants of Health   Financial Resource Strain: Low Risk  (10/19/2017)   Overall Financial Resource Strain (CARDIA)    Difficulty of Paying Living Expenses: Not very hard  Food Insecurity: No Food Insecurity (10/19/2017)   Hunger Vital Sign    Worried About Running Out of Food in the Last Year: Never true    Ran Out of Food in the Last Year: Never true  Transportation Needs: No Transportation Needs (10/19/2017)   PRAPARE - Transportation    Lack of Transportation (Medical): No    Lack of Transportation (Non-Medical): No  Physical  Activity: Sufficiently Active (10/19/2017)   Exercise Vital Sign    Days of Exercise per Week: 4 days    Minutes of Exercise per Session: 120 min  Stress: No Stress Concern Present (10/19/2017)   [employer] - Occupational Stress Questionnaire    Feeling of Stress : Not at all  Social Connections: Moderately Integrated (10/19/2017)   Social Connection and Isolation Panel [NHANES]    Frequency of Communication with Friends and Family: More than three times a week    Frequency of Social Gatherings with Friends and Family: Once a week    Attends Religious Services: More than 4 times per year    Active Member of Clubs or Organizations: Yes    Attends Club or Organization Meetings: More than 4 times per year    Marital Status: Widowed  Intimate Partner Violence: Not At Risk (10/19/2017)   Humiliation, Afraid, Rape, and Kick questionnaire    Fear of Current or Ex-Partner: No    Emotionally Abused: No    Physically Abused: No    Sexually Abused: No    FAMILY HISTORY:  Family History  Problem Relation Age of Onset   Tuberculosis Mother    Kidney failure Father    Hypertension Paternal Aunt    Diabetes Paternal Aunt    Hypertension Paternal Uncle    Diabetes Paternal Uncle    Hypertension Daughter    Stroke Daughter     CURRENT MEDICATIONS:  Current Outpatient Medications  Medication Sig Dispense Refill   acetaminophen (TYLENOL) 500 MG tablet Take by mouth.     amLODipine (NORVASC) 5 MG tablet Take 5 mg by mouth daily.      calcium carbonate (OSCAL) 1500 (600 Ca) MG TABS tablet Take by mouth.     gabapentin (NEURONTIN) 300 MG capsule Take 1 capsule (300 mg total) by mouth 2 (two) times daily. 60 capsule 3   hydrochlorothiazide (HYDRODIURIL) 25 MG tablet Take 25 mg by mouth daily.     lenalidomide (REVLIMID) 10 MG capsule Take 1 capsule (10 mg total) by mouth daily. Take for 21 days, then hold for 7 days. Repeat every 28 days. 21 capsule 0   LOW-DOSE ASPIRIN PO  Asprin Ec Low Dose     Multiple Vitamins-Minerals (THERA-M) TABS Thera-M 9 mg iron-400 mcg tablet     ondansetron (ZOFRAN) 4 MG tablet      polyethylene glycol (MIRALAX / GLYCOLAX) packet Take 17 g by mouth as needed.      potassium chloride SA (KLOR-CON M) 20 MEQ tablet TAKE 1 TABLET TWICE DAILY 180 tablet 2   vitamin B-12 (CYANOCOBALAMIN) 1000 MCG tablet Vitamin B12     No current facility-administered medications for this visit.    ALLERGIES:  No Known Allergies  PHYSICAL EXAM:  Performance status (ECOG): 1 - Symptomatic but completely ambulatory  Vitals:     03/10/22 1249  BP: 134/68  Pulse: 62  Resp: 16  Temp: 98.7 F (37.1 C)  SpO2: 100%    Wt Readings from Last 3 Encounters:  03/10/22 160 lb 1.6 oz (72.6 kg)  12/16/21 162 lb 6.4 oz (73.7 kg)  09/15/21 158 lb 8 oz (71.9 kg)   Physical Exam Vitals reviewed.  Constitutional:      Appearance: Normal appearance.  Cardiovascular:     Rate and Rhythm: Normal rate and regular rhythm.     Pulses: Normal pulses.     Heart sounds: Normal heart sounds.  Pulmonary:     Effort: Pulmonary effort is normal.     Breath sounds: Normal breath sounds.  Neurological:     General: No focal deficit present.     Mental Status: She is alert and oriented to person, place, and time.  Psychiatric:        Mood and Affect: Mood normal.        Behavior: Behavior normal.      LABORATORY DATA:  I have reviewed the labs as listed.     Latest Ref Rng & Units 03/03/2022    1:50 PM 12/09/2021    1:50 PM 09/08/2021   12:57 PM  CBC  WBC 4.0 - 10.5 K/uL 3.3  3.6  3.5   Hemoglobin 12.0 - 15.0 g/dL 12.3  12.3  12.3   Hematocrit 36.0 - 46.0 % 36.9  36.4  36.8   Platelets 150 - 400 K/uL 224  215  184       Latest Ref Rng & Units 03/03/2022    1:50 PM 12/09/2021    1:50 PM 09/08/2021   12:57 PM  CMP  Glucose 70 - 99 mg/dL 102  132  100   BUN 8 - 23 mg/dL _0 Creatinine 0.44 - 1.00 mg/dL 1.15  0.97  0.93   Sodium 135 - 145 mmol/L  134  136  136   Potassium 3.5 - 5.1 mmol/L 3.7  3.7  3.9   Chloride 98 - 111 mmol/L 102  105  103   CO2 22 - 32 mmol/L _1 Calcium 8.9 - 10.3 mg/dL 9.5  9.4  9.7   Total Protein 6.5 - 8.1 g/dL 8.1  7.7  8.3   Total Bilirubin 0.3 - 1.2 mg/dL 0.4  0.7  0.6   Alkaline Phos 38 - 126 U/L 47  49  47   AST 15 - 41 U/L _2 ALT 0 - 44 U/L _3 DIAGNOSTIC IMAGING:  I have independently reviewed the scans and discussed with the patient. No results found.   ASSESSMENT:  1.  Stage II IgA kappa multiple myeloma, standard risk: -XRT to the spine completed on November 10, 2017 following T9-T10 laminectomy with resection of epidural tumor. -5 cycles of RVD from 11/14/2017 through 02/19/2018 followed by stem cell transplant on April 03, 2018. -Revlimid maintenance therapy 10 mg 3 weeks on/1 week off started on August 24, 2018. -PET scan on 09/02/2019 showed redemonstrated bilateral cervical, right supraclavicular, right axillary hypermetabolic adenopathy with interval resolution of left axillary adenopathy.  Similar lytic and sclerotic lesions in bones with no corresponding FDG uptake.  Persistent increased FDG uptake in both palatine tonsils. -Reviewed myeloma panel from 09/26/2019.  SPEP and immunofixation is negative.  Free light chain ratio is normal with kappa light chains elevated at  32.5. -Right neck lymph node biopsy on 10/21/2019 at [facility] was negative for malignancy.   2.  Myeloma bone disease: -Zometa was started on 11/21/2017, resumed after transplant on 09/06/2018.   PLAN:  1.  Stage II IgA kappa multiple myeloma, standard risk: -Myeloma panel from 03/03/2022: M spike not observed.  Free light chain ratio is 1.81, kappa light chains 40.6.  SI fracture on 12/09/2021 was negative. - Reviewed labs from 03/03/2022 which showed creatinine 1.15, calcium 9.5.  Hemoglobin is 12.3.  Mild leukopenia from Revlimid. - Continue Revlimid maintenance 10 mg 3 weeks on/1 week  off. - RTC 3 months for follow-up with repeat myeloma labs. - She is planning to go for follow-up at [facility] in January.   2.  Myeloma bone disease: - She will receive her last dose of Zometa today which will complete approximately 3 years. - Will consider restarting Zometa on recurrence.   3.  Thromboprophylaxis: - Continue aspirin 81 mg daily.   4.  Hypertension: - Continue amlodipine and HCTZ.  Blood pressure fairly well-controlled.   5.  Peripheral neuropathy: - Tingling or numbness in the hands and feet has been stable. - Continue gabapentin 300 mg twice daily.   Orders placed this encounter:  Orders Placed This Encounter  Procedures   CBC with Differential   Comprehensive metabolic panel   Magnesium   Kappa/lambda light chains   Immunofixation electrophoresis   Protein electrophoresis, serum      Derek Jack, MD [facility] [phone]

## 2022-03-10 NOTE — Patient Instructions (Signed)
[facility]  Discharge Instructions: Thank you for choosing [facility] to provide your oncology and hematology care.  If you have a lab appointment with the [facility], please come in thru the Main Entrance and check in at the main information desk.  Wear comfortable clothing and clothing appropriate for easy access to any Portacath or PICC line.   We strive to give you quality time with your provider. You may need to reschedule your appointment if you arrive late (15 or more minutes).  Arriving late affects you and other patients whose appointments are after yours.  Also, if you miss three or more appointments without notifying the office, you may be dismissed from the clinic at the provider's discretion.      For prescription refill requests, have your pharmacy contact our office and allow 72 hours for refills to be completed.    Today you received the following chemotherapy and/or immunotherapy agents Zometa      To help prevent nausea and vomiting after your treatment, we encourage you to take your nausea medication as directed.  BELOW ARE SYMPTOMS THAT SHOULD BE REPORTED IMMEDIATELY: *FEVER GREATER THAN 100.4 F (38 C) OR HIGHER *CHILLS OR SWEATING *NAUSEA AND VOMITING THAT IS NOT CONTROLLED WITH YOUR NAUSEA MEDICATION *UNUSUAL SHORTNESS OF BREATH *UNUSUAL BRUISING OR BLEEDING *URINARY PROBLEMS (pain or burning when urinating, or frequent urination) *BOWEL PROBLEMS (unusual diarrhea, constipation, pain near the anus) TENDERNESS IN MOUTH AND THROAT WITH OR WITHOUT PRESENCE OF ULCERS (sore throat, sores in mouth, or a toothache) UNUSUAL RASH, SWELLING OR PAIN  UNUSUAL VAGINAL DISCHARGE OR ITCHING   Items with * indicate a potential emergency and should be followed up as soon as possible or go to the Emergency Department if any problems should occur.  Please show the CHEMOTHERAPY ALERT CARD or IMMUNOTHERAPY ALERT CARD at check-in to the Emergency  Department and triage nurse.  Should you have questions after your visit or need to cancel or reschedule your appointment, please contact [facility] [phone]  and follow the prompts.  Office hours are 8:00 a.m. to 4:30 p.m. Monday - Friday. Please note that voicemails left after 4:00 p.m. may not be returned until the following business day.  We are closed weekends and major holidays. You have access to a nurse at all times for urgent questions. Please call the main number to the clinic [phone] and follow the prompts.  For any non-urgent questions, you may also contact your provider using MyChart. We now offer e-Visits for anyone 61 and older to request care online for non-urgent symptoms. For details visit [email or web address].   Also download the MyChart app! Go to the app store, search "MyChart", open the app, select [facility], and log in with your MyChart username and password.  Masks are optional in the cancer centers. If you would like for your care team to wear a mask while they are taking care of you, please let them know. You may have one support person who is at least 80 years old accompany you for your appointments.

## 2022-03-10 NOTE — Patient Instructions (Addendum)
[facility] at [facility] Discharge Instructions   You were seen and examined today by Dr. Delton Coombes.  He reviewed the results of your lab work which are normal/stable.   We will proceed with your Zometa infusion today.  Continue Revlimid as prescribed.   Return as scheduled in 3 months.    Thank you for choosing [facility] at [facility] to provide your oncology and hematology care.  To afford each patient quality time with our provider, please arrive at least 15 minutes before your scheduled appointment time.   If you have a lab appointment with the [facility] please come in thru the Main Entrance and check in at the main information desk.  You need to re-schedule your appointment should you arrive 10 or more minutes late.  We strive to give you quality time with our providers, and arriving late affects you and other patients whose appointments are after yours.  Also, if you no show three or more times for appointments you may be dismissed from the clinic at the providers discretion.     Again, thank you for choosing [facility].  Our hope is that these requests will decrease the amount of time that you wait before being seen by our physicians.       _____________________________________________________________  Should you have questions after your visit to [facility], please contact our office at [phone] and follow the prompts.  Our office hours are 8:00 a.m. and 4:30 p.m. Monday - Friday.  Please note that voicemails left after 4:00 p.m. may not be returned until the following business day.  We are closed weekends and major holidays.  You do have access to a nurse 24-7, just call the main number to the clinic [phone] and do not press any options, hold on the line and a nurse will answer the phone.    For prescription refill requests, have your pharmacy contact our office and allow 72 hours.    Due  to Covid, you will need to wear a mask upon entering the hospital. If you do not have a mask, a mask will be given to you at the Main Entrance upon arrival. For doctor visits, patients may have 1 support person age 15 or older with them. For treatment visits, patients can not have anyone with them due to social distancing guidelines and our immunocompromised population.

## 2022-03-13 DIAGNOSIS — R059 Cough, unspecified: Secondary | ICD-10-CM | POA: Diagnosis not present

## 2022-03-13 DIAGNOSIS — Z20822 Contact with and (suspected) exposure to covid-19: Secondary | ICD-10-CM | POA: Diagnosis not present

## 2022-03-14 DIAGNOSIS — R059 Cough, unspecified: Secondary | ICD-10-CM | POA: Diagnosis not present

## 2022-03-14 DIAGNOSIS — Z20822 Contact with and (suspected) exposure to covid-19: Secondary | ICD-10-CM | POA: Diagnosis not present

## 2022-03-15 LAB — IMMUNOFIXATION ELECTROPHORESIS
IgA: 972 mg/dL — ABNORMAL HIGH (ref 64–422)
IgG (Immunoglobin G), Serum: 1335 mg/dL (ref 586–1602)
IgM (Immunoglobulin M), Srm: 20 mg/dL — ABNORMAL LOW (ref 26–217)
Total Protein ELP: 7.6 g/dL (ref 6.0–8.5)

## 2022-03-16 ENCOUNTER — Other Ambulatory Visit: Payer: Self-pay

## 2022-03-16 DIAGNOSIS — C9001 Multiple myeloma in remission: Secondary | ICD-10-CM

## 2022-03-16 MED ORDER — LENALIDOMIDE 10 MG PO CAPS: 10.0000 mg | ORAL_CAPSULE | Freq: Every day | ORAL | 0 refills | Status: DC

## 2022-03-16 NOTE — Telephone Encounter (Signed)
Chart reviewed. Revlimid refilled per last office note with Dr. Katragadda.  

## 2022-03-22 ENCOUNTER — Telehealth: Payer: Self-pay | Admitting: Pharmacist

## 2022-03-22 NOTE — Telephone Encounter (Signed)
Oral Chemotherapy Pharmacist Encounter   Patient will now be using a grant to fill her Revlimid at [facility], instead of mfg assistance. Patient informed and provided with the phone number to [facility].    Her Revlimid was just filled on 03/17/22, so no refill needed at this time, but her next refill for Revlimid should be sent the [facility]. Pharmacy added to her pharmacy list in [place].    Darl Pikes, PharmD, BCPS, BCOP, CPP Hematology/Oncology Clinical Pharmacist [place]/[facility]/AP [facility] [phone]  03/22/2022 4:17 PM

## 2022-03-23 ENCOUNTER — Other Ambulatory Visit (HOSPITAL_COMMUNITY): Payer: Self-pay

## 2022-03-23 ENCOUNTER — Telehealth: Payer: Self-pay

## 2022-03-23 NOTE — Telephone Encounter (Signed)
Oral Oncology Patient Advocate Encounter   Received notification that prior authorization for Lenalidomide is required.   PA submitted on 03/23/22  Key [id number]  Status is pending     Berdine Addison, [facility] Patient [facility]  [phone] (phone) [phone] (fax) 03/23/2022 9:00 AM

## 2022-03-23 NOTE — Telephone Encounter (Signed)
Oral Oncology Patient Advocate Encounter  Prior Authorization for Lenalidomide has been approved.    PA# [id number]  Effective dates: 03/21/22 through 03/21/23  Patients co-pay is $4,181.91.    Berdine Addison, [facility] Patient [place]  [phone] (phone) [phone] (fax) 03/23/2022 9:48 AM

## 2022-04-07 ENCOUNTER — Other Ambulatory Visit: Payer: Self-pay

## 2022-04-07 DIAGNOSIS — C9001 Multiple myeloma in remission: Secondary | ICD-10-CM

## 2022-04-07 MED ORDER — LENALIDOMIDE 10 MG PO CAPS: 10.0000 mg | ORAL_CAPSULE | Freq: Every day | ORAL | 0 refills | Status: DC

## 2022-04-07 NOTE — Telephone Encounter (Signed)
Chart reviewed. Revlimid refilled per last office note with Dr. Katragadda.  

## 2022-04-07 NOTE — Telephone Encounter (Signed)
Called Jamie Ewing to let her know her Revlimid rx had to been sent to [facility] today. I faxed over her supportive information the [facility]. Ms. Canova knows to call me back it she has any issues getting her medication filled.

## 2022-04-29 DIAGNOSIS — I10 Essential (primary) hypertension: Secondary | ICD-10-CM | POA: Diagnosis not present

## 2022-04-29 DIAGNOSIS — H6123 Impacted cerumen, bilateral: Secondary | ICD-10-CM | POA: Diagnosis not present

## 2022-05-02 ENCOUNTER — Other Ambulatory Visit: Payer: Self-pay | Admitting: Hematology

## 2022-05-02 DIAGNOSIS — C9001 Multiple myeloma in remission: Secondary | ICD-10-CM

## 2022-05-03 ENCOUNTER — Other Ambulatory Visit: Payer: Self-pay

## 2022-05-03 DIAGNOSIS — C9001 Multiple myeloma in remission: Secondary | ICD-10-CM

## 2022-05-03 MED ORDER — LENALIDOMIDE 10 MG PO CAPS: 10.0000 mg | ORAL_CAPSULE | Freq: Every day | ORAL | 0 refills | Status: DC

## 2022-05-03 NOTE — Telephone Encounter (Signed)
Chart reviewed. Revlimid refilled per last office note with Dr. Katragadda.  

## 2022-05-10 DIAGNOSIS — H524 Presbyopia: Secondary | ICD-10-CM | POA: Diagnosis not present

## 2022-05-14 DIAGNOSIS — Z20822 Contact with and (suspected) exposure to covid-19: Secondary | ICD-10-CM | POA: Diagnosis not present

## 2022-05-14 DIAGNOSIS — R059 Cough, unspecified: Secondary | ICD-10-CM | POA: Diagnosis not present

## 2022-05-16 DIAGNOSIS — Z20822 Contact with and (suspected) exposure to covid-19: Secondary | ICD-10-CM | POA: Diagnosis not present

## 2022-05-16 DIAGNOSIS — R059 Cough, unspecified: Secondary | ICD-10-CM | POA: Diagnosis not present

## 2022-05-18 DIAGNOSIS — Z20822 Contact with and (suspected) exposure to covid-19: Secondary | ICD-10-CM | POA: Diagnosis not present

## 2022-05-18 DIAGNOSIS — R059 Cough, unspecified: Secondary | ICD-10-CM | POA: Diagnosis not present

## 2022-05-19 DIAGNOSIS — R059 Cough, unspecified: Secondary | ICD-10-CM | POA: Diagnosis not present

## 2022-05-19 DIAGNOSIS — Z20822 Contact with and (suspected) exposure to covid-19: Secondary | ICD-10-CM | POA: Diagnosis not present

## 2022-05-20 DIAGNOSIS — Z20822 Contact with and (suspected) exposure to covid-19: Secondary | ICD-10-CM | POA: Diagnosis not present

## 2022-05-20 DIAGNOSIS — R059 Cough, unspecified: Secondary | ICD-10-CM | POA: Diagnosis not present

## 2022-05-23 DIAGNOSIS — R059 Cough, unspecified: Secondary | ICD-10-CM | POA: Diagnosis not present

## 2022-05-23 DIAGNOSIS — Z20822 Contact with and (suspected) exposure to covid-19: Secondary | ICD-10-CM | POA: Diagnosis not present

## 2022-05-30 ENCOUNTER — Other Ambulatory Visit: Payer: Self-pay | Admitting: Hematology

## 2022-05-30 DIAGNOSIS — C9001 Multiple myeloma in remission: Secondary | ICD-10-CM

## 2022-06-02 ENCOUNTER — Other Ambulatory Visit: Payer: Self-pay

## 2022-06-02 ENCOUNTER — Inpatient Hospital Stay: Payer: Medicare HMO | Attending: Hematology

## 2022-06-02 DIAGNOSIS — Z87891 Personal history of nicotine dependence: Secondary | ICD-10-CM | POA: Diagnosis not present

## 2022-06-02 DIAGNOSIS — I10 Essential (primary) hypertension: Secondary | ICD-10-CM | POA: Insufficient documentation

## 2022-06-02 DIAGNOSIS — C9000 Multiple myeloma not having achieved remission: Secondary | ICD-10-CM | POA: Diagnosis not present

## 2022-06-02 DIAGNOSIS — C9001 Multiple myeloma in remission: Secondary | ICD-10-CM

## 2022-06-02 DIAGNOSIS — G629 Polyneuropathy, unspecified: Secondary | ICD-10-CM | POA: Insufficient documentation

## 2022-06-02 DIAGNOSIS — Z79899 Other long term (current) drug therapy: Secondary | ICD-10-CM | POA: Diagnosis not present

## 2022-06-02 LAB — COMPREHENSIVE METABOLIC PANEL
ALT: 14 U/L (ref 0–44)
AST: 22 U/L (ref 15–41)
Albumin: 4 g/dL (ref 3.5–5.0)
Alkaline Phosphatase: 48 U/L (ref 38–126)
Anion gap: 13 (ref 5–15)
BUN: 17 mg/dL (ref 8–23)
CO2: 23 mmol/L (ref 22–32)
Calcium: 9.3 mg/dL (ref 8.9–10.3)
Chloride: 98 mmol/L (ref 98–111)
Creatinine, Ser: 0.94 mg/dL (ref 0.44–1.00)
GFR, Estimated: >60 mL/min (ref >60)
Glucose, Bld: 150 mg/dL — ABNORMAL HIGH (ref 70–99)
Potassium: 3.3 mmol/L — ABNORMAL LOW (ref 3.5–5.1)
Sodium: 134 mmol/L — ABNORMAL LOW (ref 135–145)
Total Bilirubin: 0.5 mg/dL (ref 0.3–1.2)
Total Protein: 8.1 g/dL (ref 6.5–8.1)

## 2022-06-02 LAB — MAGNESIUM: Magnesium: 1.8 mg/dL (ref 1.7–2.4)

## 2022-06-02 LAB — CBC WITH DIFFERENTIAL/PLATELET
Abs Immature Granulocytes: 0.01 10*3/uL (ref 0.00–0.07)
Basophils Absolute: 0 10*3/uL (ref 0.0–0.1)
Basophils Relative: 1 %
Eosinophils Absolute: 0.1 10*3/uL (ref 0.0–0.5)
Eosinophils Relative: 3 %
HCT: 37.9 % (ref 36.0–46.0)
Hemoglobin: 12.4 g/dL (ref 12.0–15.0)
Immature Granulocytes: 0 %
Lymphocytes Relative: 33 %
Lymphs Abs: 1 10*3/uL (ref 0.7–4.0)
MCH: 34.6 pg — ABNORMAL HIGH (ref 26.0–34.0)
MCHC: 32.7 g/dL (ref 30.0–36.0)
MCV: 105.9 fL — ABNORMAL HIGH (ref 80.0–100.0)
Monocytes Absolute: 0.3 10*3/uL (ref 0.1–1.0)
Monocytes Relative: 11 %
Neutro Abs: 1.5 10*3/uL — ABNORMAL LOW (ref 1.7–7.7)
Neutrophils Relative %: 52 %
Platelets: 197 10*3/uL (ref 150–400)
RBC: 3.58 MIL/uL — ABNORMAL LOW (ref 3.87–5.11)
RDW: 13.6 % (ref 11.5–15.5)
WBC: 2.9 10*3/uL — ABNORMAL LOW (ref 4.0–10.5)
nRBC: 0 % (ref 0.0–0.2)

## 2022-06-02 MED ORDER — LENALIDOMIDE 10 MG PO CAPS: 10.0000 mg | ORAL_CAPSULE | Freq: Every day | ORAL | 0 refills | Status: DC

## 2022-06-02 NOTE — Telephone Encounter (Signed)
Chart reviewed. Revlimid refilled per last office note with Dr. Katragadda.  

## 2022-06-03 LAB — KAPPA/LAMBDA LIGHT CHAINS
Kappa free light chain: 47 mg/L — ABNORMAL HIGH (ref 3.3–19.4)
Kappa, lambda light chain ratio: 1.81 — ABNORMAL HIGH (ref 0.26–1.65)
Lambda free light chains: 25.9 mg/L (ref 5.7–26.3)

## 2022-06-04 ENCOUNTER — Other Ambulatory Visit: Payer: Self-pay | Admitting: Hematology

## 2022-06-04 DIAGNOSIS — R059 Cough, unspecified: Secondary | ICD-10-CM | POA: Diagnosis not present

## 2022-06-04 DIAGNOSIS — Z20822 Contact with and (suspected) exposure to covid-19: Secondary | ICD-10-CM | POA: Diagnosis not present

## 2022-06-06 ENCOUNTER — Encounter (HOSPITAL_COMMUNITY): Payer: Self-pay | Admitting: Hematology

## 2022-06-06 DIAGNOSIS — Z20822 Contact with and (suspected) exposure to covid-19: Secondary | ICD-10-CM | POA: Diagnosis not present

## 2022-06-06 DIAGNOSIS — R059 Cough, unspecified: Secondary | ICD-10-CM | POA: Diagnosis not present

## 2022-06-06 LAB — PROTEIN ELECTROPHORESIS, SERUM
A/G Ratio: 1.2 (ref 0.7–1.7)
Albumin ELP: 4.3 g/dL (ref 2.9–4.4)
Alpha-1-Globulin: 0.2 g/dL (ref 0.0–0.4)
Alpha-2-Globulin: 0.8 g/dL (ref 0.4–1.0)
Beta Globulin: 1.5 g/dL — ABNORMAL HIGH (ref 0.7–1.3)
Gamma Globulin: 1.1 g/dL (ref 0.4–1.8)
Globulin, Total: 3.6 g/dL (ref 2.2–3.9)
Total Protein ELP: 7.9 g/dL (ref 6.0–8.5)

## 2022-06-08 LAB — IMMUNOFIXATION ELECTROPHORESIS
IgA: 965 mg/dL — ABNORMAL HIGH (ref 64–422)
IgG (Immunoglobin G), Serum: 1368 mg/dL (ref 586–1602)
IgM (Immunoglobulin M), Srm: 16 mg/dL — ABNORMAL LOW (ref 26–217)
Total Protein ELP: 7.9 g/dL (ref 6.0–8.5)

## 2022-06-09 ENCOUNTER — Inpatient Hospital Stay (HOSPITAL_BASED_OUTPATIENT_CLINIC_OR_DEPARTMENT_OTHER): Payer: Medicare HMO | Admitting: Hematology

## 2022-06-09 VITALS — BP 123/63 | HR 77 | Temp 98.1°F | Resp 18 | Ht 64.5 in | Wt 165.6 lb

## 2022-06-09 DIAGNOSIS — I10 Essential (primary) hypertension: Secondary | ICD-10-CM | POA: Diagnosis not present

## 2022-06-09 DIAGNOSIS — G629 Polyneuropathy, unspecified: Secondary | ICD-10-CM | POA: Diagnosis not present

## 2022-06-09 DIAGNOSIS — C9000 Multiple myeloma not having achieved remission: Secondary | ICD-10-CM

## 2022-06-09 DIAGNOSIS — Z79899 Other long term (current) drug therapy: Secondary | ICD-10-CM | POA: Diagnosis not present

## 2022-06-09 DIAGNOSIS — Z87891 Personal history of nicotine dependence: Secondary | ICD-10-CM | POA: Diagnosis not present

## 2022-06-09 NOTE — Progress Notes (Signed)
[facility] [address], [place] [address]    Clinic Day:  06/09/2022  Referring physician: Asencion Noble, MD  Patient Care Team: Asencion Noble, MD as PCP - General (Internal Medicine) Derek Jack, MD as Medical Oncologist (Medical Oncology)   ASSESSMENT & PLAN:   Assessment: 1.  Stage II IgA kappa multiple myeloma, standard risk: -XRT to the spine completed on November 10, 2017 following T9-T10 laminectomy with resection of epidural tumor. -5 cycles of RVD from 11/14/2017 through 02/19/2018 followed by stem cell transplant on April 03, 2018. -Revlimid maintenance therapy 10 mg 3 weeks on/1 week off started on August 24, 2018. -PET scan on 09/02/2019 showed redemonstrated bilateral cervical, right supraclavicular, right axillary hypermetabolic adenopathy with interval resolution of left axillary adenopathy.  Similar lytic and sclerotic lesions in bones with no corresponding FDG uptake.  Persistent increased FDG uptake in both palatine tonsils. -Reviewed myeloma panel from 09/26/2019.  SPEP and immunofixation is negative.  Free light chain ratio is normal with kappa light chains elevated at 32.5. -Right neck lymph node biopsy on 10/21/2019 at [facility] was negative for malignancy.   2.  Myeloma bone disease: -Zometa was started on 11/21/2017, resumed after transplant on 09/06/2018.  Plan: 1.  Stage II IgA kappa multiple myeloma, standard risk: - She is tolerating Revlimid very well. - Reviewed myeloma labs from 06/02/2022.  M spike is negative.  Immunofixation was polyclonal.  Kappa light chains are 47 and ratio is 1.81 and stable. - Mild leukopenia is also stable.  LFTs are normal. - She has chronic bilateral hip pain left more than right which is also stable.  She is using walker for long distances.  Otherwise she walks with a cane.  No new pains. - Continue Revlimid 10 mg 3 weeks on/1 week off.  She has a follow-up at [facility]  tomorrow.  RTC 3 months with repeat myeloma labs.   2.  Myeloma bone disease: - Zometa for 3 years completed on 03/10/2022.  We will restart Zometa if there is any recurrence.   3.  Thromboprophylaxis: - Continue aspirin 81 mg daily.   4.  Hypertension: - Continue amlodipine and HCTZ.  Blood pressure is fairly well-controlled.   5.  Peripheral neuropathy: - Tingling or numbness in the hands and feet has been stable.  Continue gabapentin 300 mg twice daily.    Orders Placed This Encounter  Procedures   CBC with Differential/Platelet    Standing Status:   Future    Standing Expiration Date:   06/09/2023    Order Specific Question:   Release to patient    Answer:   Immediate   Comprehensive metabolic panel    Standing Status:   Future    Standing Expiration Date:   06/09/2023    Order Specific Question:   Release to patient    Answer:   Immediate   Immunofixation electrophoresis    Standing Status:   Future    Standing Expiration Date:   06/09/2023    Order Specific Question:   Release to patient    Answer:   Immediate   Kappa/lambda light chains    Standing Status:   Future    Standing Expiration Date:   06/09/2023   Protein electrophoresis, serum    Standing Status:   Future    Standing Expiration Date:   06/09/2023    Order Specific Question:   Release to patient    Answer:   Immediate  I,Alexis Herring,acting as a Education administrator for [employer], MD.,have documented all relevant documentation on the behalf of Derek Jack, MD,as directed by  Derek Jack, MD while in the presence of Derek Jack, MD.   I, Derek Jack MD, have reviewed the above documentation for accuracy and completeness, and I agree with the above.   Derek Jack, MD   3/21/20244:47 PM  CHIEF COMPLAINT:   Diagnosis: multiple myeloma    Cancer Staging  No matching staging information was found for the patient.   Prior Therapy:  1. Radiation through  11/10/2017. 2. RVD x 5 cycles from 11/14/2017 to 02/19/2018. 3. Stem cell transplant on 04/03/2018.  Current Therapy:  Maintenance Revlimid 10 mg 3/4 weeks   HISTORY OF PRESENT ILLNESS:   Oncology History  Multiple myeloma ([place])  10/10/2017 Initial Diagnosis   Multiple myeloma ([place])   11/14/2017 - 02/26/2018 Chemotherapy   The patient had dexamethasone (DECADRON) tablet 40 mg, 40 mg (100 % of original dose 40 mg), Oral,  Once, 1 of 1 cycle Dose modification: 40 mg (original dose 40 mg, Cycle 1) Administration: 40 mg (11/14/2017) dexamethasone (DECADRON) 4 MG tablet, 1 of 1 cycle, Start date: 11/02/2017, End date: 03/12/2018 lenalidomide (REVLIMID) 25 MG capsule, 1 of 1 cycle, Start date: 02/28/2018, End date: 03/12/2018 bortezomib SQ (VELCADE) chemo injection 2.5 mg, 1.3 mg/m2 = 2.5 mg, Subcutaneous,  Once, 5 of 6 cycles Administration: 2.5 mg (11/14/2017), 2.5 mg (11/21/2017), 2.5 mg (11/28/2017), 2.5 mg (12/14/2017), 2.5 mg (12/21/2017), 2.5 mg (12/28/2017), 2.5 mg (01/04/2018), 2.5 mg (01/11/2018), 2.5 mg (01/18/2018), 2.5 mg (01/25/2018), 2.5 mg (02/01/2018), 2.5 mg (02/08/2018), 2.5 mg (02/19/2018), 2.5 mg (02/26/2018)  for chemotherapy treatment.       INTERVAL HISTORY:   Jamie Ewing is a 82 y.o. female presenting to clinic today for follow up of multiple myeloma. She was last seen by me on 03/10/22. She is scheduled for her annual cancer survivorship visit with [facility] tomorrow.  Today, she states that she is doing well overall. Her appetite level is at 70%. Her energy level is at 50%. She denies any issues with Revlimid. Patient denies any recent infections. She denies any new numbness/tingling in her hands or feet. She reports that the numbness in her feet improves once she gets up and walks around. Her symptoms are well controlled with Gabapentin.  She reports that she has chronic bilateral L>R hip pain that has not changed recently. However, she noted accompanying difficulty  with her balance with gradual onset x3 months. She denies any falls in the last x6 months. She wonders if her gait issues are secondary to her prior back surgery. She states that she does well walking with her rollator. She feels as if her hip and knee pain may also be contributing. She denies any difficulty with fine motor skills or grip strength.  PAST MEDICAL HISTORY:   Past Medical History: Past Medical History:  Diagnosis Date   Back pain    Hypercholesteremia    Hypertension     Surgical History: Past Surgical History:  Procedure Laterality Date   BONE MARROW BIOPSY     LAMINECTOMY N/A 10/06/2017   Procedure: THORACIC NINE AND TEN LAMINECTOMY WITH RESECTION OF TUMOR, THORACIC EIGHT TO THORACIC ELEVEN FUSION WITH PEDICLE SCREW FIXATION;  Surgeon: Earnie Larsson, MD;  Location: [facility];  Service: Neurosurgery;  Laterality: N/A;   REPLACEMENT TOTAL KNEE Left 2003   TOTAL KNEE ARTHROPLASTY  2001    Social History: Social History  Socioeconomic History   Marital status: Widowed    Spouse name: Not on file   Number of children: 2   Years of education: Not on file   Highest education level: Not on file  Occupational History   Not on file  Tobacco Use   Smoking status: Former    Packs/day: 0.50    Years: 30.00    Additional pack years: 0.00    Total pack years: 15.00    Types: Cigarettes    Quit date: 05/31/2006    Years since quitting: 16.0   Smokeless tobacco: Never  Vaping Use   Vaping Use: Never used  Substance and Sexual Activity   Alcohol use: Yes    Comment: wine occassionally   Drug use: No   Sexual activity: Not on file  Other Topics Concern   Not on file  Social History Narrative   Not on file   Social Determinants of Health   Financial Resource Strain: Low Risk  (10/19/2017)   Overall Financial Resource Strain (CARDIA)    Difficulty of Paying Living Expenses: Not very hard  Food Insecurity: No Food Insecurity (10/19/2017)   Hunger Vital Sign    Worried  About Running Out of Food in the Last Year: Never true    Ran Out of Food in the Last Year: Never true  Transportation Needs: No Transportation Needs (10/19/2017)   PRAPARE - Hydrologist (Medical): No    Lack of Transportation (Non-Medical): No  Physical Activity: Sufficiently Active (10/19/2017)   Exercise Vital Sign    Days of Exercise per Week: 4 days    Minutes of Exercise per Session: 120 min  Stress: No Stress Concern Present (10/19/2017)   [place]    Feeling of Stress : Not at all  Social Connections: Moderately Integrated (10/19/2017)   Social Connection and Isolation Panel [NHANES]    Frequency of Communication with Friends and Family: More than three times a week    Frequency of Social Gatherings with Friends and Family: Once a week    Attends Religious Services: More than 4 times per year    Active Member of [employer] or Organizations: Yes    Attends Archivist Meetings: More than 4 times per year    Marital Status: Widowed  Intimate Partner Violence: Not At Risk (10/19/2017)   Humiliation, Afraid, Rape, and Kick questionnaire    Fear of Current or Ex-Partner: No    Emotionally Abused: No    Physically Abused: No    Sexually Abused: No    Family History: Family History  Problem Relation Age of Onset   Tuberculosis Mother    Kidney failure Father    Hypertension Paternal Aunt    Diabetes Paternal Aunt    Hypertension Paternal Uncle    Diabetes Paternal Uncle    Hypertension Daughter    Stroke Daughter     Current Medications:  Current Outpatient Medications:    acetaminophen (TYLENOL) 500 MG tablet, Take by mouth., Disp: , Rfl:    amLODipine (NORVASC) 5 MG tablet, Take 5 mg by mouth daily. , Disp: , Rfl:    calcium carbonate (OSCAL) 1500 (600 Ca) MG TABS tablet, Take by mouth., Disp: , Rfl:    gabapentin (NEURONTIN) 300 MG capsule, TAKE 1 CAPSULE BY MOUTH TWICE A  DAY, Disp: 60 capsule, Rfl: 3   hydrochlorothiazide (HYDRODIURIL) 25 MG tablet, Take 25 mg by mouth daily., Disp: , Rfl:  lenalidomide (REVLIMID) 10 MG capsule, Take 1 capsule (10 mg total) by mouth daily. Take for 21 days, then hold for 7 days. Repeat every 28 days., Disp: 21 capsule, Rfl: 0   LOW-DOSE ASPIRIN PO, Asprin Ec Low Dose, Disp: , Rfl:    Multiple Vitamins-Minerals (THERA-M) TABS, Thera-M 9 mg iron-400 mcg tablet, Disp: , Rfl:    ondansetron (ZOFRAN) 4 MG tablet, , Disp: , Rfl:    polyethylene glycol (MIRALAX / GLYCOLAX) packet, Take 17 g by mouth as needed. , Disp: , Rfl:    potassium chloride SA (KLOR-CON M) 20 MEQ tablet, TAKE 1 TABLET TWICE DAILY, Disp: 180 tablet, Rfl: 2   vitamin B-12 (CYANOCOBALAMIN) 1000 MCG tablet, Vitamin B12, Disp: , Rfl:    Allergies: No Known Allergies  REVIEW OF SYSTEMS:   Review of Systems  Constitutional:  Negative for chills, fatigue and fever.  HENT:   Negative for lump/mass, mouth sores, nosebleeds, sore throat and trouble swallowing.   Eyes:  Negative for eye problems.  Respiratory:  Negative for cough and shortness of breath.   Cardiovascular:  Negative for chest pain, leg swelling and palpitations.  Gastrointestinal:  Negative for abdominal pain, constipation, diarrhea, nausea and vomiting.  Genitourinary:  Negative for bladder incontinence, difficulty urinating, dysuria, frequency, hematuria and nocturia.   Musculoskeletal:  Positive for arthralgias (bilateral hips, 6/10 in severity). Negative for back pain, flank pain, myalgias and neck pain.  Skin:  Negative for itching and rash.  Neurological:  Negative for dizziness, headaches and numbness.  Hematological:  Does not bruise/bleed easily.  Psychiatric/Behavioral:  Negative for depression, sleep disturbance and suicidal ideas. The patient is not nervous/anxious.   All other systems reviewed and are negative.    VITALS:   Blood pressure 123/63, pulse 77, temperature 98.1 F (36.7  C), temperature source Oral, resp. rate 18, height 5' 4.5" (1.638 m), weight 165 lb 9.6 oz (75.1 kg), SpO2 100 %.  Wt Readings from Last 3 Encounters:  06/09/22 165 lb 9.6 oz (75.1 kg)  03/10/22 160 lb 1.6 oz (72.6 kg)  12/16/21 162 lb 6.4 oz (73.7 kg)    Body mass index is 27.99 kg/m.  Performance status (ECOG): 1 - Symptomatic but completely ambulatory  PHYSICAL EXAM:   Physical Exam Vitals and nursing note reviewed. Exam conducted with a chaperone present.  Constitutional:      Appearance: Normal appearance.  Cardiovascular:     Rate and Rhythm: Normal rate and regular rhythm.     Pulses: Normal pulses.     Heart sounds: Normal heart sounds.  Pulmonary:     Effort: Pulmonary effort is normal.     Breath sounds: Normal breath sounds.  Abdominal:     Palpations: Abdomen is soft. There is no hepatomegaly, splenomegaly or mass.     Tenderness: There is no abdominal tenderness.  Musculoskeletal:     Right lower leg: No edema.     Left lower leg: No edema.  Lymphadenopathy:     Cervical: No cervical adenopathy.     Right cervical: No superficial, deep or posterior cervical adenopathy.    Left cervical: No superficial, deep or posterior cervical adenopathy.     Upper Body:     Right upper body: No supraclavicular or axillary adenopathy.     Left upper body: No supraclavicular or axillary adenopathy.  Neurological:     General: No focal deficit present.     Mental Status: She is alert and oriented to person, place, and time.  Psychiatric:  Mood and Affect: Mood normal.        Behavior: Behavior normal.     LABS:      Latest Ref Rng & Units 06/02/2022   12:34 PM 03/03/2022    1:50 PM 12/09/2021    1:50 PM  CBC  WBC 4.0 - 10.5 K/uL 2.9  3.3  3.6   Hemoglobin 12.0 - 15.0 g/dL 12.4  12.3  12.3   Hematocrit 36.0 - 46.0 % 37.9  36.9  36.4   Platelets 150 - 400 K/uL 197  224  215       Latest Ref Rng & Units 06/02/2022   12:34 PM 03/03/2022    1:50 PM 12/09/2021     1:50 PM  CMP  Glucose 70 - 99 mg/dL 150  102  132   BUN 8 - 23 mg/dL 17  21  16    Creatinine 0.44 - 1.00 mg/dL 0.94  1.15  0.97   Sodium 135 - 145 mmol/L 134  134  136   Potassium 3.5 - 5.1 mmol/L 3.3  3.7  3.7   Chloride 98 - 111 mmol/L 98  102  105   CO2 22 - 32 mmol/L 23  22  23    Calcium 8.9 - 10.3 mg/dL 9.3  9.5  9.4   Total Protein 6.5 - 8.1 g/dL 8.1  8.1  7.7   Total Bilirubin 0.3 - 1.2 mg/dL 0.5  0.4  0.7   Alkaline Phos 38 - 126 U/L 48  47  49   AST 15 - 41 U/L 22  19  16    ALT 0 - 44 U/L 14  19  17       No results found for: "CEA1", "CEA" / No results found for: "CEA1", "CEA" No results found for: "PSA1" No results found for: "CAN199" No results found for: "CAN125"  Lab Results  Component Value Date   TOTALPROTELP 7.9 06/02/2022   TOTALPROTELP 7.9 06/02/2022   ALBUMINELP 4.3 06/02/2022   A1GS 0.2 06/02/2022   A2GS 0.8 06/02/2022   BETS 1.5 (H) 06/02/2022   GAMS 1.1 06/02/2022   MSPIKE Not Observed 06/02/2022   SPEI Comment 06/02/2022   Lab Results  Component Value Date   TIBC 186 (L) 11/14/2017   FERRITIN 455 (H) 11/14/2017   IRONPCTSAT 20 11/14/2017   Lab Results  Component Value Date   LDH 109 03/03/2022   LDH 112 12/09/2021   LDH 100 09/08/2021     STUDIES:   No results found.

## 2022-06-09 NOTE — Patient Instructions (Addendum)
[facility]  Discharge Instructions  You were seen and examined today by Dr. Delton Coombes.  Dr. Delton Coombes discussed your most recent lab work which revealed that everything looks good and stable.  Continue taking Revlimid as prescribed.  Follow-up as scheduled in 3 months.    Thank you for choosing [facility] to provide your oncology and hematology care.   To afford each patient quality time with our provider, please arrive at least 15 minutes before your scheduled appointment time. You may need to reschedule your appointment if you arrive late (10 or more minutes). Arriving late affects you and other patients whose appointments are after yours.  Also, if you miss three or more appointments without notifying the office, you may be dismissed from the clinic at the provider's discretion.    Again, thank you for choosing [facility].  Our hope is that these requests will decrease the amount of time that you wait before being seen by our physicians.   If you have a lab appointment with the [facility] please come in thru the Main Entrance and check in at the main information desk.           _____________________________________________________________  Should you have questions after your visit to [facility], please contact our office at [phone] and follow the prompts.  Our office hours are 8:00 a.m. to 4:30 p.m. Monday - Thursday and 8:00 a.m. to 2:30 p.m. Friday.  Please note that voicemails left after 4:00 p.m. may not be returned until the following business day.  We are closed weekends and all major holidays.  You do have access to a nurse 24-7, just call the main number to the clinic [phone] and do not press any options, hold on the line and a nurse will answer the phone.    For prescription refill requests, have your pharmacy contact our office and allow 72 hours.    Masks are optional in  the cancer centers. If you would like for your care team to wear a mask while they are taking care of you, please let them know. You may have one support person who is at least 81 years old accompany you for your appointments.

## 2022-06-10 DIAGNOSIS — Z9484 Stem cells transplant status: Secondary | ICD-10-CM | POA: Diagnosis not present

## 2022-06-10 DIAGNOSIS — C9001 Multiple myeloma in remission: Secondary | ICD-10-CM | POA: Diagnosis not present

## 2022-06-14 ENCOUNTER — Other Ambulatory Visit: Payer: Self-pay | Admitting: Hematology

## 2022-06-14 DIAGNOSIS — E876 Hypokalemia: Secondary | ICD-10-CM

## 2022-06-27 ENCOUNTER — Other Ambulatory Visit: Payer: Self-pay | Admitting: Hematology

## 2022-06-27 DIAGNOSIS — C9001 Multiple myeloma in remission: Secondary | ICD-10-CM

## 2022-06-29 ENCOUNTER — Other Ambulatory Visit: Payer: Self-pay | Admitting: *Deleted

## 2022-06-29 DIAGNOSIS — C9001 Multiple myeloma in remission: Secondary | ICD-10-CM

## 2022-06-29 MED ORDER — LENALIDOMIDE 10 MG PO CAPS: 10.0000 mg | ORAL_CAPSULE | Freq: Every day | ORAL | 0 refills | Status: DC

## 2022-07-07 ENCOUNTER — Ambulatory Visit (HOSPITAL_COMMUNITY)
Admission: RE | Admit: 2022-07-07 | Discharge: 2022-07-07 | Disposition: A | Discharge status: Home / Self Care | Payer: Medicare HMO | Source: Ambulatory Visit | Attending: Internal Medicine | Admitting: Internal Medicine

## 2022-07-07 ENCOUNTER — Other Ambulatory Visit (HOSPITAL_COMMUNITY): Payer: Self-pay | Admitting: Internal Medicine

## 2022-07-07 DIAGNOSIS — M89211 Other disorders of bone development and growth, right shoulder: Secondary | ICD-10-CM | POA: Insufficient documentation

## 2022-07-07 DIAGNOSIS — C9000 Multiple myeloma not having achieved remission: Secondary | ICD-10-CM | POA: Diagnosis not present

## 2022-07-07 DIAGNOSIS — M19011 Primary osteoarthritis, right shoulder: Secondary | ICD-10-CM | POA: Diagnosis not present

## 2022-07-18 ENCOUNTER — Other Ambulatory Visit: Payer: Self-pay

## 2022-07-18 DIAGNOSIS — C9001 Multiple myeloma in remission: Secondary | ICD-10-CM

## 2022-07-18 MED ORDER — LENALIDOMIDE 10 MG PO CAPS: 10.0000 mg | ORAL_CAPSULE | Freq: Every day | ORAL | 0 refills | Status: DC

## 2022-07-18 NOTE — Telephone Encounter (Signed)
Chart reviewed. Revlimid refilled per last office note with Dr. Katragadda.  

## 2022-07-20 ENCOUNTER — Other Ambulatory Visit: Payer: Self-pay | Admitting: Hematology

## 2022-07-20 DIAGNOSIS — C9001 Multiple myeloma in remission: Secondary | ICD-10-CM

## 2022-08-01 ENCOUNTER — Inpatient Hospital Stay: Payer: Medicare HMO | Attending: Hematology | Admitting: Hematology

## 2022-08-01 DIAGNOSIS — C9000 Multiple myeloma not having achieved remission: Secondary | ICD-10-CM | POA: Diagnosis not present

## 2022-08-01 LAB — COMPREHENSIVE METABOLIC PANEL
ALT: 27 U/L (ref 0–44)
AST: 19 U/L (ref 15–41)
Albumin: 3.9 g/dL (ref 3.5–5.0)
Alkaline Phosphatase: 50 U/L (ref 38–126)
Anion gap: 10 (ref 5–15)
BUN: 17 mg/dL (ref 8–23)
CO2: 26 mmol/L (ref 22–32)
Calcium: 9.6 mg/dL (ref 8.9–10.3)
Chloride: 98 mmol/L (ref 98–111)
Creatinine, Ser: 0.89 mg/dL (ref 0.44–1.00)
GFR, Estimated: >60 mL/min (ref >60)
Glucose, Bld: 106 mg/dL — ABNORMAL HIGH (ref 70–99)
Potassium: 3 mmol/L — ABNORMAL LOW (ref 3.5–5.1)
Sodium: 134 mmol/L — ABNORMAL LOW (ref 135–145)
Total Bilirubin: 1 mg/dL (ref 0.3–1.2)
Total Protein: 8.2 g/dL — ABNORMAL HIGH (ref 6.5–8.1)

## 2022-08-01 LAB — CBC WITH DIFFERENTIAL/PLATELET
Abs Immature Granulocytes: 0.01 10*3/uL (ref 0.00–0.07)
Basophils Absolute: 0 10*3/uL (ref 0.0–0.1)
Basophils Relative: 1 %
Eosinophils Absolute: 0.1 10*3/uL (ref 0.0–0.5)
Eosinophils Relative: 4 %
HCT: 38.4 % (ref 36.0–46.0)
Hemoglobin: 12.9 g/dL (ref 12.0–15.0)
Immature Granulocytes: 0 %
Lymphocytes Relative: 28 %
Lymphs Abs: 0.9 10*3/uL (ref 0.7–4.0)
MCH: 34.2 pg — ABNORMAL HIGH (ref 26.0–34.0)
MCHC: 33.6 g/dL (ref 30.0–36.0)
MCV: 101.9 fL — ABNORMAL HIGH (ref 80.0–100.0)
Monocytes Absolute: 0.4 10*3/uL (ref 0.1–1.0)
Monocytes Relative: 12 %
Neutro Abs: 1.7 10*3/uL (ref 1.7–7.7)
Neutrophils Relative %: 55 %
Platelets: 179 10*3/uL (ref 150–400)
RBC: 3.77 MIL/uL — ABNORMAL LOW (ref 3.87–5.11)
RDW: 13.3 % (ref 11.5–15.5)
WBC: 3.1 10*3/uL — ABNORMAL LOW (ref 4.0–10.5)
nRBC: 0 % (ref 0.0–0.2)

## 2022-08-01 NOTE — Progress Notes (Signed)
Per Dr. Ellin Saba, patient instructed to take 1 estra potassium today, then resume 1 daily.  Verbalized understanding.

## 2022-08-02 ENCOUNTER — Encounter (HOSPITAL_COMMUNITY): Payer: Self-pay | Admitting: Hematology

## 2022-08-02 LAB — KAPPA/LAMBDA LIGHT CHAINS
Kappa free light chain: 52.7 mg/L — ABNORMAL HIGH (ref 3.3–19.4)
Kappa, lambda light chain ratio: 1.59 (ref 0.26–1.65)
Lambda free light chains: 33.1 mg/L — ABNORMAL HIGH (ref 5.7–26.3)

## 2022-08-04 LAB — PROTEIN ELECTROPHORESIS, SERUM
A/G Ratio: 1 (ref 0.7–1.7)
Albumin ELP: 3.9 g/dL (ref 2.9–4.4)
Alpha-1-Globulin: 0.2 g/dL (ref 0.0–0.4)
Alpha-2-Globulin: 0.9 g/dL (ref 0.4–1.0)
Beta Globulin: 1.4 g/dL — ABNORMAL HIGH (ref 0.7–1.3)
Gamma Globulin: 1.6 g/dL (ref 0.4–1.8)
Globulin, Total: 4.1 g/dL — ABNORMAL HIGH (ref 2.2–3.9)
Total Protein ELP: 8 g/dL (ref 6.0–8.5)

## 2022-08-10 LAB — IMMUNOFIXATION ELECTROPHORESIS
IgA: 1176 mg/dL — ABNORMAL HIGH (ref 64–422)
IgG (Immunoglobin G), Serum: 1315 mg/dL (ref 586–1602)
IgM (Immunoglobulin M), Srm: 14 mg/dL — ABNORMAL LOW (ref 26–217)
Total Protein ELP: 7.9 g/dL (ref 6.0–8.5)

## 2022-08-11 ENCOUNTER — Other Ambulatory Visit: Payer: Self-pay | Admitting: Hematology

## 2022-08-11 DIAGNOSIS — C9001 Multiple myeloma in remission: Secondary | ICD-10-CM

## 2022-08-14 ENCOUNTER — Emergency Department (HOSPITAL_COMMUNITY)
Admission: EM | Admit: 2022-08-14 | Discharge: 2022-08-14 | Disposition: A | Discharge status: Home / Self Care | Payer: Medicare HMO | Attending: Emergency Medicine | Admitting: Emergency Medicine

## 2022-08-14 ENCOUNTER — Encounter (HOSPITAL_COMMUNITY): Payer: Self-pay

## 2022-08-14 ENCOUNTER — Emergency Department (HOSPITAL_COMMUNITY): Payer: Medicare HMO

## 2022-08-14 ENCOUNTER — Other Ambulatory Visit: Payer: Self-pay

## 2022-08-14 DIAGNOSIS — R531 Weakness: Secondary | ICD-10-CM | POA: Diagnosis not present

## 2022-08-14 DIAGNOSIS — D72819 Decreased white blood cell count, unspecified: Secondary | ICD-10-CM | POA: Diagnosis not present

## 2022-08-14 DIAGNOSIS — Z85848 Personal history of malignant neoplasm of other parts of nervous tissue: Secondary | ICD-10-CM | POA: Insufficient documentation

## 2022-08-14 DIAGNOSIS — Z79899 Other long term (current) drug therapy: Secondary | ICD-10-CM | POA: Insufficient documentation

## 2022-08-14 DIAGNOSIS — R42 Dizziness and giddiness: Secondary | ICD-10-CM | POA: Diagnosis not present

## 2022-08-14 DIAGNOSIS — E878 Other disorders of electrolyte and fluid balance, not elsewhere classified: Secondary | ICD-10-CM | POA: Diagnosis not present

## 2022-08-14 DIAGNOSIS — R0602 Shortness of breath: Secondary | ICD-10-CM | POA: Diagnosis not present

## 2022-08-14 DIAGNOSIS — R519 Headache, unspecified: Secondary | ICD-10-CM | POA: Diagnosis not present

## 2022-08-14 LAB — COMPREHENSIVE METABOLIC PANEL
ALT: 26 U/L (ref 0–44)
AST: 20 U/L (ref 15–41)
Albumin: 3.7 g/dL (ref 3.5–5.0)
Alkaline Phosphatase: 44 U/L (ref 38–126)
Anion gap: 16 — ABNORMAL HIGH (ref 5–15)
BUN: 15 mg/dL (ref 8–23)
CO2: 19 mmol/L — ABNORMAL LOW (ref 22–32)
Calcium: 9.3 mg/dL (ref 8.9–10.3)
Chloride: 99 mmol/L (ref 98–111)
Creatinine, Ser: 0.99 mg/dL (ref 0.44–1.00)
GFR, Estimated: 58 mL/min — ABNORMAL LOW (ref >60)
Glucose, Bld: 127 mg/dL — ABNORMAL HIGH (ref 70–99)
Potassium: 3.7 mmol/L (ref 3.5–5.1)
Sodium: 134 mmol/L — ABNORMAL LOW (ref 135–145)
Total Bilirubin: 0.8 mg/dL (ref 0.3–1.2)
Total Protein: 8 g/dL (ref 6.5–8.1)

## 2022-08-14 LAB — CBC WITH DIFFERENTIAL/PLATELET
Abs Immature Granulocytes: 0.01 10*3/uL (ref 0.00–0.07)
Basophils Absolute: 0 10*3/uL (ref 0.0–0.1)
Basophils Relative: 1 %
Eosinophils Absolute: 0.2 10*3/uL (ref 0.0–0.5)
Eosinophils Relative: 5 %
HCT: 36.7 % (ref 36.0–46.0)
Hemoglobin: 12.4 g/dL (ref 12.0–15.0)
Immature Granulocytes: 0 %
Lymphocytes Relative: 33 %
Lymphs Abs: 1 10*3/uL (ref 0.7–4.0)
MCH: 34.4 pg — ABNORMAL HIGH (ref 26.0–34.0)
MCHC: 33.8 g/dL (ref 30.0–36.0)
MCV: 101.9 fL — ABNORMAL HIGH (ref 80.0–100.0)
Monocytes Absolute: 0.5 10*3/uL (ref 0.1–1.0)
Monocytes Relative: 18 %
Neutro Abs: 1.3 10*3/uL — ABNORMAL LOW (ref 1.7–7.7)
Neutrophils Relative %: 43 %
Platelets: 188 10*3/uL (ref 150–400)
RBC: 3.6 MIL/uL — ABNORMAL LOW (ref 3.87–5.11)
RDW: 13.3 % (ref 11.5–15.5)
WBC: 3 10*3/uL — ABNORMAL LOW (ref 4.0–10.5)
nRBC: 0 % (ref 0.0–0.2)

## 2022-08-14 LAB — URINALYSIS, ROUTINE W REFLEX MICROSCOPIC
Bacteria, UA: NONE SEEN
Bilirubin Urine: NEGATIVE
Glucose, UA: NEGATIVE mg/dL
Ketones, ur: NEGATIVE mg/dL
Leukocytes,Ua: NEGATIVE
Nitrite: NEGATIVE
Protein, ur: NEGATIVE mg/dL
Specific Gravity, Urine: 1.011 (ref 1.005–1.030)
pH: 5 (ref 5.0–8.0)

## 2022-08-14 LAB — TROPONIN I (HIGH SENSITIVITY)
Troponin I (High Sensitivity): 6 ng/L (ref <18)
Troponin I (High Sensitivity): 7 ng/L (ref <18)

## 2022-08-14 LAB — BRAIN NATRIURETIC PEPTIDE: B Natriuretic Peptide: 39 pg/mL (ref 0.0–100.0)

## 2022-08-14 LAB — TSH: TSH: 0.716 u[IU]/mL (ref 0.350–4.500)

## 2022-08-14 MED ORDER — SODIUM CHLORIDE 0.9 % IV BOLUS
1000.0000 mL | Freq: Once | INTRAVENOUS | Status: AC
  Administered 2022-08-14: 1000 mL via INTRAVENOUS

## 2022-08-14 NOTE — ED Provider Notes (Signed)
[facility] EMERGENCY DEPARTMENT AT [facility] Provider Note   CSN: [id number] Arrival date & time: 08/14/22  4098     History  Chief Complaint  Patient presents with   Weakness    Jamie Ewing is a 82 y.o. female.  She has a history of multiple myeloma status post bone marrow transplant, follows with Dr. Ellin Saba.  She also has a history of a spinal tumor which left her with some residual left-sided weakness and uses a cane.  She said for the last 2 or 3 weeks she has been progressively weak.  Needing to use a rollator walker.  Saw Dr. Ellin Saba and found of a low potassium and has been supplementing.  She also been more short of breath with any kind of activity.  No fevers chest pain abdominal pain vomiting diarrhea urinary symptoms.  Has been getting intermittent headaches.  She feels her weakness is mostly general weakness and is not necessarily limited by her breathing.  Gets very dizzy lightheaded when she stands  The history is provided by the patient.  Weakness Severity:  Moderate Onset quality:  Gradual Duration:  3 weeks Timing:  Constant Progression:  Unchanged Chronicity:  New Relieved by:  Nothing Worsened by:  Activity Ineffective treatments:  Rest and drinking fluids Associated symptoms: difficulty walking, dizziness, headaches and shortness of breath   Associated symptoms: no abdominal pain, no aphasia, no chest pain, no cough, no diarrhea, no dysuria, no fever, no loss of consciousness, no nausea and no vomiting        Home Medications Prior to Admission medications   Medication Sig Start Date End Date Taking? Authorizing Provider  acetaminophen (TYLENOL) 500 MG tablet Take by mouth. 10/21/19   [provider]  amLODipine (NORVASC) 5 MG tablet Take 5 mg by mouth daily.  05/13/11   [provider]  calcium carbonate (OSCAL) 1500 (600 Ca) MG TABS tablet Take by mouth. 04/17/18   [provider]  gabapentin (NEURONTIN) 300 MG  capsule TAKE 1 CAPSULE BY MOUTH TWICE A DAY 06/06/22   [name], MD  hydrochlorothiazide (HYDRODIURIL) 25 MG tablet Take 25 mg by mouth daily.    [provider]  lenalidomide (REVLIMID) 10 MG capsule Take 1 capsule (10 mg total) by mouth daily. Take for 21 days, then hold for 7 days. Repeat every 28 days. 07/18/22   [name], MD  LOW-DOSE ASPIRIN PO Asprin Ec Low Dose    [provider]  Multiple Vitamins-Minerals (THERA-M) TABS Thera-M 9 mg iron-400 mcg tablet    [provider]  ondansetron (ZOFRAN) 4 MG tablet     [provider]  polyethylene glycol (MIRALAX / GLYCOLAX) packet Take 17 g by mouth as needed.  04/17/18   [provider]  potassium chloride SA (KLOR-CON M) 20 MEQ tablet TAKE 1 TABLET TWICE DAILY 06/14/22   [name], MD  vitamin B-12 (CYANOCOBALAMIN) 1000 MCG tablet Vitamin B12    [provider]      Allergies    Patient has no known allergies.    Review of Systems   Review of Systems  Constitutional:  Negative for fever.  Eyes:  Negative for visual disturbance.  Respiratory:  Positive for shortness of breath. Negative for cough.   Cardiovascular:  Negative for chest pain.  Gastrointestinal:  Negative for abdominal pain, diarrhea, nausea and vomiting.  Genitourinary:  Negative for dysuria.  Neurological:  Positive for dizziness, weakness and headaches. Negative for loss of consciousness.  Physical Exam Updated Vital Signs BP (!) 169/77 (BP Location: Left Arm)   Pulse 85   Temp 98.3 F (36.8 C) (Oral)   Resp 14   SpO2 99%  Physical Exam Vitals and nursing note reviewed.  Constitutional:      General: She is not in acute distress.    Appearance: Normal appearance. She is well-developed.  HENT:     Head: Normocephalic and atraumatic.  Eyes:     Conjunctiva/sclera: Conjunctivae normal.  Cardiovascular:     Rate and Rhythm: Normal rate and regular rhythm.     Heart sounds:  No murmur heard. Pulmonary:     Effort: Pulmonary effort is normal. No respiratory distress.     Breath sounds: Normal breath sounds.  Abdominal:     Palpations: Abdomen is soft.     Tenderness: There is no abdominal tenderness. There is no guarding or rebound.  Musculoskeletal:        General: No deformity. Normal range of motion.     Cervical back: Neck supple.     Right lower leg: No edema.     Left lower leg: No edema.  Skin:    General: Skin is warm and dry.     Capillary Refill: Capillary refill takes less than 2 seconds.  Neurological:     General: No focal deficit present.     Mental Status: She is alert and oriented to person, place, and time.     Sensory: No sensory deficit.     Motor: Weakness present.     Comments: She has 5 out of 5 strength in her upper extremities.  She has difficulty lifting her legs off the bed symmetrically lower extremities.  Normal distal sensation and distal pulses.  No slurred speech no facial droop     ED Results / Procedures / Treatments   Labs (all labs ordered are listed, but only abnormal results are displayed) Labs Reviewed  COMPREHENSIVE METABOLIC PANEL - Abnormal; Notable for the following components:      Result Value   Sodium 134 (*)    CO2 19 (*)    Glucose, Bld 127 (*)    GFR, Estimated 58 (*)    Anion gap 16 (*)    All other components within normal limits  CBC WITH DIFFERENTIAL/PLATELET - Abnormal; Notable for the following components:   WBC 3.0 (*)    RBC 3.60 (*)    MCV 101.9 (*)    MCH 34.4 (*)    Neutro Abs 1.3 (*)    All other components within normal limits  URINALYSIS, ROUTINE W REFLEX MICROSCOPIC - Abnormal; Notable for the following components:   Hgb urine dipstick SMALL (*)    All other components within normal limits  BRAIN NATRIURETIC PEPTIDE  TSH  TROPONIN I (HIGH SENSITIVITY)  TROPONIN I (HIGH SENSITIVITY)    EKG EKG Interpretation  Date/Time:  Sunday Aug 14 2022 12:18:40 EDT Ventricular Rate:   81 PR Interval:  156 QRS Duration: 83 QT Interval:  407 QTC Calculation: 473 R Axis:   -4 Text Interpretation: Sinus rhythm Probable left atrial enlargement Probable left ventricular hypertrophy Anterior Q waves, possibly due to LVH No significant change since prior 5/02 Confirmed by Meridee Score [phone]) on 08/14/2022 12:48:58 PM  Radiology DG Chest Port 1 View  Result Date: 08/14/2022 CLINICAL DATA:  Shortness of breath.  History of multiple myeloma. EXAM: PORTABLE CHEST 1 VIEW COMPARISON:  Metastatic bone survey dated October 12, 2017. Chest x-ray dated October 10, 2016.  FINDINGS: Patient is rotated to the left. The heart size and mediastinal contours are within normal limits. Low lung volumes are present, causing crowding of the pulmonary vasculature. No focal consolidation, pleural effusion, or pneumothorax. Unchanged 1.7 cm lucent lesion in the distal left clavicle consistent with history of multiple. Prior T8-T11 posterior fusion. IMPRESSION: 1. Low lung volumes. No active disease. Electronically Signed   By: Obie Dredge M.D.   On: 08/14/2022 10:13    Procedures Procedures    Medications Ordered in ED Medications  sodium chloride 0.9 % bolus 1,000 mL (has no administration in time range)    ED Course/ Medical Decision Making/ A&P Clinical Course as of 08/14/22 1842  Sun Aug 14, 2022  1254 Results of workup with patient and family.  They are comfortable plan for discharge.  Will have her follow-up with her PCP.  Return instructions discussed [MB]    Clinical Course User Index [MB] Terrilee Files, MD                             Medical Decision Making Amount and/or Complexity of Data Reviewed Labs: ordered. Radiology: ordered.   This patient complains of general weakness, dyspnea on exertion, fatigue; this involves an extensive number of treatment Options and is a complaint that carries with it a high risk of complications and morbidity. The differential includes  dehydration, anemia, metabolic derangement, infection  I ordered, reviewed and interpreted labs, which included CBC with low white count stable from priors, stable hemoglobin, chemistries with low bicarb normal glucose normal renal function, urinalysis without signs of infection, troponins flat, TSH normal I ordered medication IV fluids and reviewed PMP when indicated. I ordered imaging studies which included chest x-ray and I independently    visualized and interpreted imaging which showed no acute findings Additional history obtained from patient's daughters Previous records obtained and reviewed in epic including recent oncology notes Cardiac monitoring reviewed, normal sinus rhythm Social determinants considered, no significant barriers Critical Interventions: None  After the interventions stated above, I reevaluated the patient and found patient to be resting comfortably satting 100% on room air in no distress Admission and further testing considered, I reviewed her results with her and she does not feel she needs admission to the hospital.  She would rather go home and follow-up with her treatment team.  Reviewed return instructions with her and her family.         Final Clinical Impression(s) / ED Diagnoses Final diagnoses:  Generalized weakness    Rx / DC Orders ED Discharge Orders     None         Terrilee Files, MD 08/14/22 1844

## 2022-08-14 NOTE — Discharge Instructions (Signed)
You were seen in the emergency department for general weakness.  You had blood work EKG chest x-ray urinalysis that did not show an obvious explanation for your symptoms.  Please keep well-hydrated and follow-up with your primary care doctor.  Return to the emergency department if any worsening or concerning symptoms.

## 2022-08-14 NOTE — ED Triage Notes (Signed)
Pt c/o weakness on the left side for the past few weeks. Denies any chest pain or sob. Daughter noted pt appeared to be sob. Pt states she has not had much of an appetite.

## 2022-08-16 ENCOUNTER — Other Ambulatory Visit: Payer: Self-pay

## 2022-08-16 DIAGNOSIS — C9001 Multiple myeloma in remission: Secondary | ICD-10-CM

## 2022-08-16 MED ORDER — LENALIDOMIDE 10 MG PO CAPS
10.0000 mg | ORAL_CAPSULE | Freq: Every day | ORAL | 0 refills | Status: DC
Start: 2022-08-16 — End: 2022-11-24

## 2022-08-16 NOTE — Telephone Encounter (Signed)
Chart reviewed. Revlimid refilled per last office note with Dr. Katragadda.  

## 2022-08-18 ENCOUNTER — Telehealth: Payer: Self-pay

## 2022-08-18 NOTE — Telephone Encounter (Signed)
Transition Care Management Unsuccessful Follow-up Telephone Call  Date of discharge and from where:  [facility]  Attempts:  1st Attempt  Reason for unsuccessful TCM follow-up call:  Unable to leave message   Lenard Forth [facility] Guide, [facility] [phone] [address] [place], [place], [place] [address] Phone: [phone] Email: [email or web address]

## 2022-08-19 ENCOUNTER — Telehealth: Payer: Self-pay

## 2022-08-19 NOTE — Telephone Encounter (Signed)
Transition Care Management Unsuccessful Follow-up Telephone Call  Date of discharge and from where:  [facility]  Attempts:  2nd Attempt  Reason for unsuccessful TCM follow-up call:  Left voice message   [facility]ard Forth [facility] Guide, [facility] [phone] [address] [place], [place], [place] [address] Phone: [phone] Email: [email or web address]

## 2022-08-24 ENCOUNTER — Other Ambulatory Visit: Payer: Self-pay | Admitting: *Deleted

## 2022-08-24 MED ORDER — GABAPENTIN 300 MG PO CAPS: 300.0000 mg | ORAL_CAPSULE | Freq: Two times a day (BID) | ORAL | 3 refills | Status: DC

## 2022-08-31 ENCOUNTER — Other Ambulatory Visit: Payer: Self-pay | Admitting: *Deleted

## 2022-08-31 DIAGNOSIS — R29898 Other symptoms and signs involving the musculoskeletal system: Secondary | ICD-10-CM

## 2022-08-31 DIAGNOSIS — R531 Weakness: Secondary | ICD-10-CM | POA: Diagnosis not present

## 2022-08-31 DIAGNOSIS — C9001 Multiple myeloma in remission: Secondary | ICD-10-CM

## 2022-08-31 DIAGNOSIS — C9000 Multiple myeloma not having achieved remission: Secondary | ICD-10-CM | POA: Diagnosis not present

## 2022-09-05 ENCOUNTER — Other Ambulatory Visit: Payer: Self-pay

## 2022-09-05 DIAGNOSIS — C9001 Multiple myeloma in remission: Secondary | ICD-10-CM

## 2022-09-06 ENCOUNTER — Inpatient Hospital Stay: Payer: Medicare HMO | Attending: Hematology

## 2022-09-06 ENCOUNTER — Other Ambulatory Visit: Payer: Self-pay

## 2022-09-06 DIAGNOSIS — C9000 Multiple myeloma not having achieved remission: Secondary | ICD-10-CM | POA: Diagnosis not present

## 2022-09-06 DIAGNOSIS — Z87891 Personal history of nicotine dependence: Secondary | ICD-10-CM | POA: Diagnosis not present

## 2022-09-06 DIAGNOSIS — G629 Polyneuropathy, unspecified: Secondary | ICD-10-CM | POA: Insufficient documentation

## 2022-09-06 DIAGNOSIS — Z79899 Other long term (current) drug therapy: Secondary | ICD-10-CM | POA: Diagnosis not present

## 2022-09-06 DIAGNOSIS — C9001 Multiple myeloma in remission: Secondary | ICD-10-CM

## 2022-09-06 DIAGNOSIS — Z7982 Long term (current) use of aspirin: Secondary | ICD-10-CM | POA: Insufficient documentation

## 2022-09-06 DIAGNOSIS — I10 Essential (primary) hypertension: Secondary | ICD-10-CM | POA: Insufficient documentation

## 2022-09-06 LAB — CBC WITH DIFFERENTIAL/PLATELET
Abs Immature Granulocytes: 0.01 10*3/uL (ref 0.00–0.07)
Basophils Absolute: 0 10*3/uL (ref 0.0–0.1)
Basophils Relative: 1 %
Eosinophils Absolute: 0.1 10*3/uL (ref 0.0–0.5)
Eosinophils Relative: 3 %
HCT: 36.5 % (ref 36.0–46.0)
Hemoglobin: 12.1 g/dL (ref 12.0–15.0)
Immature Granulocytes: 0 %
Lymphocytes Relative: 30 %
Lymphs Abs: 1 10*3/uL (ref 0.7–4.0)
MCH: 33.6 pg (ref 26.0–34.0)
MCHC: 33.2 g/dL (ref 30.0–36.0)
MCV: 101.4 fL — ABNORMAL HIGH (ref 80.0–100.0)
Monocytes Absolute: 0.7 10*3/uL (ref 0.1–1.0)
Monocytes Relative: 20 %
Neutro Abs: 1.5 10*3/uL — ABNORMAL LOW (ref 1.7–7.7)
Neutrophils Relative %: 46 %
Platelets: 199 10*3/uL (ref 150–400)
RBC: 3.6 MIL/uL — ABNORMAL LOW (ref 3.87–5.11)
RDW: 13.7 % (ref 11.5–15.5)
WBC: 3.3 10*3/uL — ABNORMAL LOW (ref 4.0–10.5)
nRBC: 0 % (ref 0.0–0.2)

## 2022-09-06 LAB — COMPREHENSIVE METABOLIC PANEL
ALT: 21 U/L (ref 0–44)
AST: 20 U/L (ref 15–41)
Albumin: 3.7 g/dL (ref 3.5–5.0)
Alkaline Phosphatase: 43 U/L (ref 38–126)
Anion gap: 12 (ref 5–15)
BUN: 18 mg/dL (ref 8–23)
CO2: 22 mmol/L (ref 22–32)
Calcium: 9.2 mg/dL (ref 8.9–10.3)
Chloride: 100 mmol/L (ref 98–111)
Creatinine, Ser: 0.71 mg/dL (ref 0.44–1.00)
GFR, Estimated: >60 mL/min (ref >60)
Glucose, Bld: 103 mg/dL — ABNORMAL HIGH (ref 70–99)
Potassium: 3.5 mmol/L (ref 3.5–5.1)
Sodium: 134 mmol/L — ABNORMAL LOW (ref 135–145)
Total Bilirubin: 0.6 mg/dL (ref 0.3–1.2)
Total Protein: 7.8 g/dL (ref 6.5–8.1)

## 2022-09-07 ENCOUNTER — Ambulatory Visit (HOSPITAL_COMMUNITY)
Admission: RE | Admit: 2022-09-07 | Discharge: 2022-09-07 | Disposition: A | Discharge status: Home / Self Care | Payer: Medicare HMO | Source: Ambulatory Visit | Attending: Hematology | Admitting: Hematology

## 2022-09-07 DIAGNOSIS — C9001 Multiple myeloma in remission: Secondary | ICD-10-CM | POA: Diagnosis not present

## 2022-09-07 DIAGNOSIS — R29898 Other symptoms and signs involving the musculoskeletal system: Secondary | ICD-10-CM | POA: Insufficient documentation

## 2022-09-07 DIAGNOSIS — R531 Weakness: Secondary | ICD-10-CM | POA: Diagnosis not present

## 2022-09-07 LAB — KAPPA/LAMBDA LIGHT CHAINS
Kappa free light chain: 48.6 mg/L — ABNORMAL HIGH (ref 3.3–19.4)
Kappa, lambda light chain ratio: 1.41 (ref 0.26–1.65)
Lambda free light chains: 34.4 mg/L — ABNORMAL HIGH (ref 5.7–26.3)

## 2022-09-07 MED ORDER — GADOBUTROL 1 MMOL/ML IV SOLN
7.0000 mL | Freq: Once | INTRAVENOUS | Status: AC | PRN
  Administered 2022-09-07: 7 mL via INTRAVENOUS

## 2022-09-09 LAB — PROTEIN ELECTROPHORESIS, SERUM
A/G Ratio: 0.9 (ref 0.7–1.7)
Albumin ELP: 3.6 g/dL (ref 2.9–4.4)
Alpha-1-Globulin: 0.2 g/dL (ref 0.0–0.4)
Alpha-2-Globulin: 0.9 g/dL (ref 0.4–1.0)
Beta Globulin: 1.3 g/dL (ref 0.7–1.3)
Gamma Globulin: 1.4 g/dL (ref 0.4–1.8)
Globulin, Total: 3.8 g/dL (ref 2.2–3.9)
Total Protein ELP: 7.4 g/dL (ref 6.0–8.5)

## 2022-09-12 LAB — IMMUNOFIXATION ELECTROPHORESIS
IgA: 898 mg/dL — ABNORMAL HIGH (ref 64–422)
IgG (Immunoglobin G), Serum: 1197 mg/dL (ref 586–1602)
IgM (Immunoglobulin M), Srm: 15 mg/dL — ABNORMAL LOW (ref 26–217)
Total Protein ELP: 7.4 g/dL (ref 6.0–8.5)

## 2022-09-13 ENCOUNTER — Encounter: Payer: Self-pay | Admitting: Hematology

## 2022-09-13 ENCOUNTER — Inpatient Hospital Stay: Payer: Medicare HMO | Admitting: Hematology

## 2022-09-13 VITALS — BP 124/71 | HR 83 | Temp 98.5°F | Resp 18 | Wt 155.8 lb

## 2022-09-13 DIAGNOSIS — R29898 Other symptoms and signs involving the musculoskeletal system: Secondary | ICD-10-CM | POA: Diagnosis not present

## 2022-09-13 DIAGNOSIS — G629 Polyneuropathy, unspecified: Secondary | ICD-10-CM | POA: Diagnosis not present

## 2022-09-13 DIAGNOSIS — C9001 Multiple myeloma in remission: Secondary | ICD-10-CM | POA: Diagnosis not present

## 2022-09-13 DIAGNOSIS — Z87891 Personal history of nicotine dependence: Secondary | ICD-10-CM | POA: Diagnosis not present

## 2022-09-13 DIAGNOSIS — I10 Essential (primary) hypertension: Secondary | ICD-10-CM | POA: Diagnosis not present

## 2022-09-13 DIAGNOSIS — Z79899 Other long term (current) drug therapy: Secondary | ICD-10-CM | POA: Diagnosis not present

## 2022-09-13 DIAGNOSIS — C9000 Multiple myeloma not having achieved remission: Secondary | ICD-10-CM | POA: Diagnosis not present

## 2022-09-13 DIAGNOSIS — Z7982 Long term (current) use of aspirin: Secondary | ICD-10-CM | POA: Diagnosis not present

## 2022-09-13 NOTE — Progress Notes (Signed)
[facility] [address], [place] [address]    Clinic Day:  09/13/2022  Referring physician: Carylon Perches, MD  Patient Care Team: Carylon Perches, MD as PCP - General (Internal Medicine) [name], MD as Medical Oncologist (Medical Oncology)   ASSESSMENT & PLAN:   Assessment: 1.  Stage II IgA kappa multiple myeloma, standard risk: -XRT to the spine completed on November 10, 2017 following T9-T10 laminectomy with resection of epidural tumor, T8-T11 posterior fusion by Dr. Jordan Likes. -5 cycles of RVD from 11/14/2017 through 02/19/2018 followed by stem cell transplant on April 03, 2018. -Revlimid maintenance therapy 10 mg 3 weeks on/1 week off started on August 24, 2018. -PET scan on 09/02/2019 showed redemonstrated bilateral cervical, right supraclavicular, right axillary hypermetabolic adenopathy with interval resolution of left axillary adenopathy.  Similar lytic and sclerotic lesions in bones with no corresponding FDG uptake.  Persistent increased FDG uptake in both palatine tonsils. -Reviewed myeloma panel from 09/26/2019.  SPEP and immunofixation is negative.  Free light chain ratio is normal with kappa light chains elevated at 32.5. -Right neck lymph node biopsy on 10/21/2019 at [facility] was negative for malignancy.   2.  Myeloma bone disease: -Zometa was started on 11/21/2017, resumed after transplant on 09/06/2018.    Plan: 1.  Stage II IgA kappa multiple myeloma, standard risk: - She is tolerating Revlimid well. - Reviewed labs from 09/06/2022: M spike is negative.  Free light chain ratio is normal at 1.41.  Immunofixation shows polyclonal gammopathy.  IgG levels are 898. - She reported generalized weakness since last visit 3 months ago.  She is having to use walker all the time.  She also reported decreased appetite and lost 10 pounds since last visit. - She reported eyes becoming dry and gritty.  There is mild right eye ptosis with miosis.  She had both  eye cataract surgery 2 years ago. - Reviewed MRI of the spine from 09/07/2022: No evidence of myeloma in the cervical thoracic or lumbar spine.  Postoperative changes of T9-11 laminectomy and T8-11 posterior spinal fusion.  Multilevel spondylosis in the cervical, thoracic and lumbar spine with out high-grade spinal canal stenosis or neuroforaminal narrowing.  Chronic appearing compression deformity of the L4 superior endplate. - It is not clear of the etiology of generalized weakness.  She has slightly decreased muscle strength in the lower extremities, particularly at the hip flexion. - I will order a PET scan as secondary malignancies are possible while on Revlimid.  We will also order brain MRI with and without contrast due to right eye changes.  RTC after above.   2.  Myeloma bone disease: - Zometa for 3 years completed on 03/10/2022.   3.  Thromboprophylaxis: - Continue aspirin 81 mg daily.   4.  Hypertension: - Continue amlodipine and HCTZ.  Blood pressure is fairly controlled.   5.  Peripheral neuropathy: - Continue gabapentin 300 mg twice daily.  Tingling and numbness in the hands and feet stable.    Orders Placed This Encounter  Procedures   NM PET Image Restag (PS) Skull Base To Thigh    Standing Status:   Future    Standing Expiration Date:   09/13/2023    Order Specific Question:   If indicated for the ordered procedure, I authorize the administration of a radiopharmaceutical per Radiology protocol    Answer:   Yes    Order Specific Question:   Preferred imaging location?    Answer:  [facility]    Order Specific Question:   Release to patient    Answer:   Immediate   MR Brain W Wo Contrast    Standing Status:   Future    Standing Expiration Date:   09/13/2023    Order Specific Question:   If indicated for the ordered procedure, I authorize the administration of contrast media per Radiology protocol    Answer:   Yes    Order Specific Question:   What is the patient's  sedation requirement?    Answer:   No Sedation    Order Specific Question:   Does the patient have a pacemaker or implanted devices?    Answer:   No    Order Specific Question:   Use SRS Protocol?    Answer:   No    Order Specific Question:   Preferred imaging location?    Answer:   [facility] (table limit [phone])    Order Specific Question:   Release to patient    Answer:   Immediate      I,Katie Daubenspeck,acting as a scribe for [name], MD.,have documented all relevant documentation on the behalf of [name], MD,as directed by  [name], MD while in the presence of [name], MD.   I, [name] MD, have reviewed the above documentation for accuracy and completeness, and I agree with the above.   [name], MD   6/25/20245:46 PM  CHIEF COMPLAINT:   Diagnosis: multiple myeloma    Cancer Staging  No matching staging information was found for the patient.   Prior Therapy: 1. Radiation through 11/10/2017. 2. RVD x 5 cycles from 11/14/2017 to 02/19/2018. 3. Stem cell transplant on 04/03/2018.  Current Therapy:  Maintenance Revlimid 10 mg 3/4 weeks    HISTORY OF PRESENT ILLNESS:   Oncology History  Multiple myeloma (HCC)  10/10/2017 Initial Diagnosis   Multiple myeloma (HCC)   11/14/2017 - 02/26/2018 Chemotherapy   The patient had dexamethasone (DECADRON) tablet 40 mg, 40 mg (100 % of original dose 40 mg), Oral,  Once, 1 of 1 cycle Dose modification: 40 mg (original dose 40 mg, Cycle 1) Administration: 40 mg (11/14/2017) dexamethasone (DECADRON) 4 MG tablet, 1 of 1 cycle, Start date: 11/02/2017, End date: 03/12/2018 lenalidomide (REVLIMID) 25 MG capsule, 1 of 1 cycle, Start date: 02/28/2018, End date: 03/12/2018 bortezomib SQ (VELCADE) chemo injection 2.5 mg, 1.3 mg/m2 = 2.5 mg, Subcutaneous,  Once, 5 of 6 cycles Administration: 2.5 mg (11/14/2017), 2.5 mg (11/21/2017), 2.5 mg (11/28/2017), 2.5 mg  (12/14/2017), 2.5 mg (12/21/2017), 2.5 mg (12/28/2017), 2.5 mg (01/04/2018), 2.5 mg (01/11/2018), 2.5 mg (01/18/2018), 2.5 mg (01/25/2018), 2.5 mg (02/01/2018), 2.5 mg (02/08/2018), 2.5 mg (02/19/2018), 2.5 mg (02/26/2018)  for chemotherapy treatment.       INTERVAL HISTORY:   Beatriz is a 82 y.o. female presenting to clinic today for follow up of multiple myeloma. She was last seen by me on 06/09/22.  Since her last visit, she underwent total spine screening MRI on 09/07/22 showing: no evidence of focal myeloma; postoperative changes of T8-11; likely treated disease in left 4th rib; multilevel spondylosis; chronic-appearing compression deformity of L4.  Today, she states that she is doing well overall. Her appetite level is at 25%. Her energy level is at 10%.  PAST MEDICAL HISTORY:   Past Medical History: Past Medical History:  Diagnosis Date   Back pain    Hypercholesteremia    Hypertension     Surgical History:  Past Surgical History:  Procedure Laterality Date   BONE MARROW BIOPSY     LAMINECTOMY N/A 10/06/2017   Procedure: THORACIC NINE AND TEN LAMINECTOMY WITH RESECTION OF TUMOR, THORACIC EIGHT TO THORACIC ELEVEN FUSION WITH PEDICLE SCREW FIXATION;  Surgeon: Julio Sicks, MD;  Location: [facility] OR;  Service: Neurosurgery;  Laterality: N/A;   REPLACEMENT TOTAL KNEE Left 2003   TOTAL KNEE ARTHROPLASTY  2001    Social History: Social History   Socioeconomic History   Marital status: Widowed    Spouse name: Not on file   Number of children: 2   Years of education: Not on file   Highest education level: Not on file  Occupational History   Not on file  Tobacco Use   Smoking status: Former    Packs/day: 0.50    Years: 30.00    Additional pack years: 0.00    Total pack years: 15.00    Types: Cigarettes    Quit date: 05/31/2006    Years since quitting: 16.2   Smokeless tobacco: Never  Vaping Use   Vaping Use: Never used  Substance and Sexual Activity   Alcohol use: Yes     Comment: wine occassionally   Drug use: No   Sexual activity: Not on file  Other Topics Concern   Not on file  Social History Narrative   Not on file   Social Determinants of Health   Financial Resource Strain: Low Risk  (10/19/2017)   Overall Financial Resource Strain (CARDIA)    Difficulty of Paying Living Expenses: Not very hard  Food Insecurity: No Food Insecurity (10/19/2017)   Hunger Vital Sign    Worried About Running Out of Food in the Last Year: Never true    Ran Out of Food in the Last Year: Never true  Transportation Needs: No Transportation Needs (10/19/2017)   PRAPARE - Administrator, Civil Service (Medical): No    Lack of Transportation (Non-Medical): No  Physical Activity: Sufficiently Active (10/19/2017)   Exercise Vital Sign    Days of Exercise per Week: 4 days    Minutes of Exercise per Session: 120 min  Stress: No Stress Concern Present (10/19/2017)   [employer] of Occupational Health - Occupational Stress Questionnaire    Feeling of Stress : Not at all  Social Connections: Moderately Integrated (10/19/2017)   Social Connection and Isolation Panel [NHANES]    Frequency of Communication with Friends and Family: More than three times a week    Frequency of Social Gatherings with Friends and Family: Once a week    Attends Religious Services: More than 4 times per year    Active Member of [employer] or Organizations: Yes    Attends Banker Meetings: More than 4 times per year    Marital Status: Widowed  Intimate Partner Violence: Not At Risk (10/19/2017)   Humiliation, Afraid, Rape, and Kick questionnaire    Fear of Current or Ex-Partner: No    Emotionally Abused: No    Physically Abused: No    Sexually Abused: No    Family History: Family History  Problem Relation Age of Onset   Tuberculosis Mother    Kidney failure Father    Hypertension Paternal Aunt    Diabetes Paternal Aunt    Hypertension Paternal Uncle    Diabetes Paternal Uncle     Hypertension Daughter    Stroke Daughter     Current Medications:  Current Outpatient Medications:    acetaminophen (TYLENOL) 500 MG tablet,  Take by mouth., Disp: , Rfl:    amLODipine (NORVASC) 5 MG tablet, Take 5 mg by mouth daily. , Disp: , Rfl:    calcium carbonate (OSCAL) 1500 (600 Ca) MG TABS tablet, Take by mouth., Disp: , Rfl:    gabapentin (NEURONTIN) 300 MG capsule, Take 1 capsule (300 mg total) by mouth 2 (two) times daily., Disp: 60 capsule, Rfl: 3   hydrochlorothiazide (HYDRODIURIL) 25 MG tablet, Take 25 mg by mouth daily., Disp: , Rfl:    lenalidomide (REVLIMID) 10 MG capsule, Take 1 capsule (10 mg total) by mouth daily. Take for 21 days, then hold for 7 days. Repeat every 28 days., Disp: 21 capsule, Rfl: 0   LORazepam (ATIVAN) 0.5 MG tablet, Take 0.25-0.5 mg by mouth 2 (two) times daily as needed for anxiety., Disp: , Rfl:    LOW-DOSE ASPIRIN PO, Asprin Ec Low Dose, Disp: , Rfl:    Multiple Vitamins-Minerals (THERA-M) TABS, Thera-M 9 mg iron-400 mcg tablet, Disp: , Rfl:    ondansetron (ZOFRAN) 4 MG tablet, , Disp: , Rfl:    polyethylene glycol (MIRALAX / GLYCOLAX) packet, Take 17 g by mouth as needed. , Disp: , Rfl:    potassium chloride SA (KLOR-CON M) 20 MEQ tablet, TAKE 1 TABLET TWICE DAILY, Disp: 180 tablet, Rfl: 3   vitamin B-12 (CYANOCOBALAMIN) 1000 MCG tablet, Vitamin B12, Disp: , Rfl:    Allergies: No Known Allergies  REVIEW OF SYSTEMS:   Review of Systems  Constitutional:  Negative for chills, fatigue and fever.  HENT:   Negative for lump/mass, mouth sores, nosebleeds, sore throat and trouble swallowing.   Eyes:  Negative for eye problems.  Respiratory:  Positive for shortness of breath. Negative for cough.   Cardiovascular:  Negative for chest pain, leg swelling and palpitations.  Gastrointestinal:  Positive for constipation. Negative for abdominal pain, diarrhea, nausea and vomiting.  Genitourinary:  Negative for bladder incontinence, difficulty urinating,  dysuria, frequency, hematuria and nocturia.   Musculoskeletal:  Negative for arthralgias, back pain, flank pain, myalgias and neck pain.  Skin:  Negative for itching and rash.  Neurological:  Positive for dizziness, headaches and numbness.  Hematological:  Does not bruise/bleed easily.  Psychiatric/Behavioral:  Positive for sleep disturbance. Negative for depression and suicidal ideas. The patient is nervous/anxious.   All other systems reviewed and are negative.    VITALS:   Blood pressure 124/71, pulse 83, temperature 98.5 F (36.9 C), temperature source Oral, resp. rate 18, weight 155 lb 12.8 oz (70.7 kg), SpO2 100 %.  Wt Readings from Last 3 Encounters:  09/13/22 155 lb 12.8 oz (70.7 kg)  06/09/22 165 lb 9.6 oz (75.1 kg)  03/10/22 160 lb 1.6 oz (72.6 kg)    Body mass index is 26.33 kg/m.  Performance status (ECOG): 1 - Symptomatic but completely ambulatory  PHYSICAL EXAM:   Physical Exam Vitals and nursing note reviewed. Exam conducted with a chaperone present.  Constitutional:      Appearance: Normal appearance.  Cardiovascular:     Rate and Rhythm: Normal rate and regular rhythm.     Pulses: Normal pulses.     Heart sounds: Normal heart sounds.  Pulmonary:     Effort: Pulmonary effort is normal.     Breath sounds: Normal breath sounds.  Abdominal:     Palpations: Abdomen is soft. There is no hepatomegaly, splenomegaly or mass.     Tenderness: There is no abdominal tenderness.  Musculoskeletal:     Right lower leg: No edema.  Left lower leg: No edema.  Lymphadenopathy:     Cervical: No cervical adenopathy.     Right cervical: No superficial, deep or posterior cervical adenopathy.    Left cervical: No superficial, deep or posterior cervical adenopathy.     Upper Body:     Right upper body: No supraclavicular or axillary adenopathy.     Left upper body: No supraclavicular or axillary adenopathy.  Neurological:     General: No focal deficit present.     Mental  Status: She is alert and oriented to person, place, and time.  Psychiatric:        Mood and Affect: Mood normal.        Behavior: Behavior normal.     LABS:      Latest Ref Rng & Units 09/06/2022    2:50 PM 08/14/2022    9:35 AM 08/01/2022    9:14 AM  CBC  WBC 4.0 - 10.5 K/uL 3.3  3.0  3.1   Hemoglobin 12.0 - 15.0 g/dL 16.1  09.6  04.5   Hematocrit 36.0 - 46.0 % 36.5  36.7  38.4   Platelets 150 - 400 K/uL 199  188  179       Latest Ref Rng & Units 09/06/2022    2:50 PM 08/14/2022    9:35 AM 08/01/2022    9:14 AM  CMP  Glucose 70 - 99 mg/dL 409  811  914   BUN 8 - 23 mg/dL 18  15  17    Creatinine 0.44 - 1.00 mg/dL 7.82  9.56  2.13   Sodium 135 - 145 mmol/L 134  134  134   Potassium 3.5 - 5.1 mmol/L 3.5  3.7  3.0   Chloride 98 - 111 mmol/L 100  99  98   CO2 22 - 32 mmol/L 22  19  26    Calcium 8.9 - 10.3 mg/dL 9.2  9.3  9.6   Total Protein 6.5 - 8.1 g/dL 7.8  8.0  8.2   Total Bilirubin 0.3 - 1.2 mg/dL 0.6  0.8  1.0   Alkaline Phos 38 - 126 U/L 43  44  50   AST 15 - 41 U/L 20  20  19    ALT 0 - 44 U/L 21  26  27       No results found for: "CEA1", "CEA" / No results found for: "CEA1", "CEA" No results found for: "PSA1" No results found for: "CAN199" No results found for: "CAN125"  Lab Results  Component Value Date   TOTALPROTELP 7.4 09/06/2022   TOTALPROTELP 7.4 09/06/2022   ALBUMINELP 3.6 09/06/2022   A1GS 0.2 09/06/2022   A2GS 0.9 09/06/2022   BETS 1.3 09/06/2022   GAMS 1.4 09/06/2022   MSPIKE Not Observed 09/06/2022   SPEI Comment 09/06/2022   Lab Results  Component Value Date   TIBC 186 (L) 11/14/2017   FERRITIN 455 (H) 11/14/2017   IRONPCTSAT 20 11/14/2017   Lab Results  Component Value Date   LDH 109 03/03/2022   LDH 112 12/09/2021   LDH 100 09/08/2021     STUDIES:   MR TOTAL SPINE METS SCREENING  Result Date: 09/07/2022 CLINICAL DATA:  Multiple myeloma.  Lower extremity weakness. EXAM: MRI TOTAL SPINE WITHOUT AND WITH CONTRAST TECHNIQUE:  Multisequence MR imaging of the spine from the cervical spine to the sacrum was performed prior to and following IV contrast administration for evaluation of spinal metastatic disease. CONTRAST:  7mL GADAVIST GADOBUTROL 1 MMOL/ML IV SOLN COMPARISON:  Lumbar spine MRI 10/06/2017. Thoracic spine radiographs 02/28/2018. Cervical spine radiographs 12/25/2016. FINDINGS: MRI CERVICAL SPINE FINDINGS Alignment: Unchanged 3 mm anterolisthesis of C3 on C4 and 3 mm retrolisthesis of C5 on C6. Vertebrae: Normal vertebral body heights. No suspicious marrow lesions. Cord: Normal spinal cord signal and volume. No abnormal enhancement. Posterior Fossa, vertebral arteries, paraspinal tissues: Unremarkable. Disc levels: Multilevel cervical spondylosis, worst at C5-6 and C6-7 where there is mild spinal canal stenosis. MRI THORACIC SPINE FINDINGS Alignment: Normal. Vertebrae: Postoperative changes of T9-11 laminectomy with T8-11 posterior spinal fusion. No suspicious marrow lesions. Sclerosis and expansion of the left 4th rib without abnormal enhancement, suggestive of treated disease. Interval resolution of marrow replacement and epidural extension of disease at the T10 level. Cord: Limited evaluation from T7-11 due to metal artifact from spinal fusion hardware. Within that limitation, no abnormal spinal cord signal on sagittal images. Paraspinal and other soft tissues: Unremarkable. Disc levels: No spinal canal stenosis or neural foraminal narrowing in the thoracic spine. MRI LUMBAR SPINE FINDINGS Segmentation: Standard. Alignment: Normal. Vertebrae: Chronic appearing compression deformity of the L4 superior endplate with mild anterior height loss. No suspicious marrow lesions. Conus medullaris: Extends to the L1 level and appears normal. Paraspinal and other soft tissues: Unremarkable. Disc levels: Multilevel lumbar spondylosis without high-grade spinal canal stenosis or neural foraminal narrowing. IMPRESSION: 1. No evidence of  focal myeloma in the cervical, thoracic, or lumbar spine. 2. Postoperative changes of T9-11 laminectomy with T8-11 posterior spinal fusion. 3. Likely treated disease in the left 4th rib. 4. Multilevel spondylosis in the cervical, thoracic, and lumbar spine without high-grade spinal canal stenosis or neural foraminal narrowing. 5. Chronic appearing compression deformity of the L4 superior endplate with mild anterior height loss. Electronically Signed   By: Orvan Falconer M.D.   On: 09/07/2022 14:13

## 2022-09-13 NOTE — Patient Instructions (Signed)
[facility]  Discharge Instructions  You were seen and examined today by Dr. Ellin Saba.  Dr. Ellin Saba discussed your most recent lab work and MRI which revealed that everything looks good and stable.  Dr. Ellin Saba is going to schedule you for MRI of your Brain and a PET scan.  Follow-up as scheduled after scans.    Thank you for choosing [facility] to provide your oncology and hematology care.   To afford each patient quality time with our provider, please arrive at least 15 minutes before your scheduled appointment time. You may need to reschedule your appointment if you arrive late (10 or more minutes). Arriving late affects you and other patients whose appointments are after yours.  Also, if you miss three or more appointments without notifying the office, you may be dismissed from the clinic at the provider's discretion.    Again, thank you for choosing [facility].  Our hope is that these requests will decrease the amount of time that you wait before being seen by our physicians.   If you have a lab appointment with the Cancer Center - please note that after April 8th, all labs will be drawn in the cancer center.  You do not have to check in or register with the main entrance as you have in the past but will complete your check-in at the cancer center.            _____________________________________________________________  Should you have questions after your visit to [facility], please contact our office at [phone] and follow the prompts.  Our office hours are 8:00 a.m. to 4:30 p.m. Monday - Thursday and 8:00 a.m. to 2:30 p.m. Friday.  Please note that voicemails left after 4:00 p.m. may not be returned until the following business day.  We are closed weekends and all major holidays.  You do have access to a nurse 24-7, just call the main number to the clinic [phone] and do not press any  options, hold on the line and a nurse will answer the phone.    For prescription refill requests, have your pharmacy contact our office and allow 72 hours.    Masks are no longer required in the cancer centers. If you would like for your care team to wear a mask while they are taking care of you, please let them know. You may have one support person who is at least 81 years old accompany you for your appointments.

## 2022-09-14 ENCOUNTER — Other Ambulatory Visit: Payer: Self-pay

## 2022-09-14 ENCOUNTER — Encounter (HOSPITAL_COMMUNITY): Payer: Self-pay | Admitting: Hematology

## 2022-09-14 DIAGNOSIS — C9001 Multiple myeloma in remission: Secondary | ICD-10-CM

## 2022-09-14 NOTE — Telephone Encounter (Signed)
Revlimid on hold for now per verbal order from Dr. Ellin Saba

## 2022-09-28 DIAGNOSIS — M5416 Radiculopathy, lumbar region: Secondary | ICD-10-CM | POA: Diagnosis not present

## 2022-09-28 DIAGNOSIS — Z6827 Body mass index (BMI) 27.0-27.9, adult: Secondary | ICD-10-CM | POA: Diagnosis not present

## 2022-09-28 DIAGNOSIS — M4714 Other spondylosis with myelopathy, thoracic region: Secondary | ICD-10-CM | POA: Diagnosis not present

## 2022-09-29 ENCOUNTER — Encounter (HOSPITAL_COMMUNITY)
Admission: RE | Admit: 2022-09-29 | Discharge: 2022-09-29 | Disposition: A | Discharge status: Home / Self Care | Payer: Medicare HMO | Source: Ambulatory Visit | Attending: Hematology | Admitting: Hematology

## 2022-09-29 DIAGNOSIS — R29898 Other symptoms and signs involving the musculoskeletal system: Secondary | ICD-10-CM | POA: Insufficient documentation

## 2022-09-29 DIAGNOSIS — C9001 Multiple myeloma in remission: Secondary | ICD-10-CM | POA: Insufficient documentation

## 2022-09-29 DIAGNOSIS — C9000 Multiple myeloma not having achieved remission: Secondary | ICD-10-CM | POA: Diagnosis not present

## 2022-09-29 MED ORDER — FLUDEOXYGLUCOSE F - 18 (FDG) INJECTION
8.0800 | Freq: Once | INTRAVENOUS | Status: AC | PRN
  Administered 2022-09-29: 8.08 via INTRAVENOUS

## 2022-10-06 ENCOUNTER — Ambulatory Visit (HOSPITAL_COMMUNITY)
Admission: RE | Admit: 2022-10-06 | Discharge: 2022-10-06 | Disposition: A | Discharge status: Home / Self Care | Payer: Medicare HMO | Source: Ambulatory Visit | Attending: Hematology | Admitting: Hematology

## 2022-10-06 DIAGNOSIS — R29898 Other symptoms and signs involving the musculoskeletal system: Secondary | ICD-10-CM | POA: Insufficient documentation

## 2022-10-06 DIAGNOSIS — C9001 Multiple myeloma in remission: Secondary | ICD-10-CM | POA: Diagnosis not present

## 2022-10-06 DIAGNOSIS — I6782 Cerebral ischemia: Secondary | ICD-10-CM | POA: Diagnosis not present

## 2022-10-06 MED ORDER — GADOBUTROL 1 MMOL/ML IV SOLN
7.0000 mL | Freq: Once | INTRAVENOUS | Status: AC | PRN
  Administered 2022-10-06: 7 mL via INTRAVENOUS

## 2022-10-11 ENCOUNTER — Inpatient Hospital Stay: Payer: Medicare HMO | Attending: Hematology | Admitting: Hematology

## 2022-10-11 VITALS — BP 125/67 | HR 93 | Temp 99.2°F | Resp 18 | Wt 156.8 lb

## 2022-10-11 DIAGNOSIS — Z7982 Long term (current) use of aspirin: Secondary | ICD-10-CM | POA: Insufficient documentation

## 2022-10-11 DIAGNOSIS — Z9484 Stem cells transplant status: Secondary | ICD-10-CM | POA: Diagnosis not present

## 2022-10-11 DIAGNOSIS — Z87891 Personal history of nicotine dependence: Secondary | ICD-10-CM | POA: Diagnosis not present

## 2022-10-11 DIAGNOSIS — I10 Essential (primary) hypertension: Secondary | ICD-10-CM | POA: Insufficient documentation

## 2022-10-11 DIAGNOSIS — C9000 Multiple myeloma not having achieved remission: Secondary | ICD-10-CM | POA: Insufficient documentation

## 2022-10-11 DIAGNOSIS — G629 Polyneuropathy, unspecified: Secondary | ICD-10-CM | POA: Insufficient documentation

## 2022-10-11 DIAGNOSIS — Z79899 Other long term (current) drug therapy: Secondary | ICD-10-CM | POA: Diagnosis not present

## 2022-10-11 NOTE — Patient Instructions (Addendum)
[facility]  Discharge Instructions  You were seen and examined today by Dr. Ellin Saba.  Dr. Ellin Saba discussed your most recent lab work and CT scan which revealed that you have a spot on the right post rib and the left mid femur.  Dr. Ellin Saba is going to refer you back to [facility] for radiation of the rib and the femur. Radiation will help to prevent the femur bone from fracturing.  Follow-up as scheduled in 6 weeks.    Thank you for choosing [facility] to provide your oncology and hematology care.   To afford each patient quality time with our provider, please arrive at least 15 minutes before your scheduled appointment time. You may need to reschedule your appointment if you arrive late (10 or more minutes). Arriving late affects you and other patients whose appointments are after yours.  Also, if you miss three or more appointments without notifying the office, you may be dismissed from the clinic at the provider's discretion.    Again, thank you for choosing [facility].  Our hope is that these requests will decrease the amount of time that you wait before being seen by our physicians.   If you have a lab appointment with the Cancer Center - please note that after April 8th, all labs will be drawn in the cancer center.  You do not have to check in or register with the main entrance as you have in the past but will complete your check-in at the cancer center.            _____________________________________________________________  Should you have questions after your visit to [facility], please contact our office at [phone] and follow the prompts.  Our office hours are 8:00 a.m. to 4:30 p.m. Monday - Thursday and 8:00 a.m. to 2:30 p.m. Friday.  Please note that voicemails left after 4:00 p.m. may not be returned until the following business day.  We are closed weekends and all major  holidays.  You do have access to a nurse 24-7, just call the main number to the clinic [phone] and do not press any options, hold on the line and a nurse will answer the phone.    For prescription refill requests, have your pharmacy contact our office and allow 72 hours.    Masks are no longer required in the cancer centers. If you would like for your care team to wear a mask while they are taking care of you, please let them know. You may have one support person who is at least 81 years old accompany you for your appointments.

## 2022-10-11 NOTE — Progress Notes (Signed)
[facility] [address], [place] [address]    Clinic Day:  10/11/2022  Referring physician: Carylon Perches, MD  Patient Care Team: Carylon Perches, MD as PCP - General (Internal Medicine) [name], MD as Medical Oncologist (Medical Oncology)   ASSESSMENT & PLAN:   Assessment: 1.  Stage II IgA kappa multiple myeloma, standard risk: -XRT to the spine completed on November 10, 2017 following T9-T10 laminectomy with resection of epidural tumor, T8-T11 posterior fusion by Dr. Jordan Likes. -5 cycles of RVD from 11/14/2017 through 02/19/2018 followed by stem cell transplant on April 03, 2018. -Revlimid maintenance therapy 10 mg 3 weeks on/1 week off started on August 24, 2018. -PET scan on 09/02/2019 showed redemonstrated bilateral cervical, right supraclavicular, right axillary hypermetabolic adenopathy with interval resolution of left axillary adenopathy.  Similar lytic and sclerotic lesions in bones with no corresponding FDG uptake.  Persistent increased FDG uptake in both palatine tonsils. -Reviewed myeloma panel from 09/26/2019.  SPEP and immunofixation is negative.  Free light chain ratio is normal with kappa light chains elevated at 32.5. -Right neck lymph node biopsy on 10/21/2019 at [facility] was negative for malignancy.   2.  Myeloma bone disease: -Zometa was started on 11/21/2017, resumed after transplant on 09/06/2018.    Plan: 1.  Stage II IgA kappa multiple myeloma, standard risk: - At last visit she complained of generalized weakness for about 3 months.  She is having to use walker all the time.  Whereas previously she was walking with a cane.  She also reported decreased appetite and lost about 10 pounds. - Myeloma labs on 09/06/2022: M spike was negative.  FLC ratio was 1.41 with elevated kappa light chains.  IgA level was also elevated.  Immunofixation was polyclonal. - Maintenance Revlimid was held on 09/13/2022. - Since then she has noticed improvement  in energy levels. - I reviewed MRI of the brain from 10/06/2022: No significant pathology. - I reviewed PET scan from 09/29/2022: Right posterior 10th rib hypermetabolism.  Focal hypermetabolic some in the left mid femur shaft. - Recommend radiation oncology consultation for radiation of these 2 active lesions. - Will continue to hold Revlimid at this time. - She has seen Dr. Dutch Quint, who has ordered MRI of her spine which will be done tomorrow.  This is to see what is causing her weakness in the legs.  I have reviewed records from his office. - I will see her back in 6 weeks for follow-up with repeat myeloma labs.    2.  Myeloma bone disease: - Zometa for 3 years completed on 03/10/2022.   3.  Thromboprophylaxis: - Continue aspirin 81 mg daily.   4.  Hypertension: - Continue amlodipine and HCTZ.  Blood pressure today is 124/67.   5.  Peripheral neuropathy: - Continue gabapentin 300 mg twice daily.  Tingling and numbness in the hands and feet is stable.    Orders Placed This Encounter  Procedures   CBC with Differential/Platelet    Standing Status:   Future    Standing Expiration Date:   10/11/2023    Order Specific Question:   Release to patient    Answer:   Immediate   Comprehensive metabolic panel    Standing Status:   Future    Standing Expiration Date:   10/11/2023    Order Specific Question:   Release to patient    Answer:   Immediate   Protein electrophoresis, serum    Standing Status:  Future    Standing Expiration Date:   10/11/2023    Order Specific Question:   Release to patient    Answer:   Immediate   Immunofixation electrophoresis    Standing Status:   Future    Standing Expiration Date:   10/11/2023    Order Specific Question:   Release to patient    Answer:   Immediate   Kappa/lambda light chains    Standing Status:   Future    Standing Expiration Date:   10/11/2023      Alben Deeds Teague,acting as a scribe for [name], MD.,have documented all  relevant documentation on the behalf of [name], MD,as directed by  [name], MD while in the presence of [name], MD.  I, [name] MD, have reviewed the above documentation for accuracy and completeness, and I agree with the above.    [name], MD   7/23/20244:09 PM  CHIEF COMPLAINT:   Diagnosis: multiple myeloma    Cancer Staging  No matching staging information was found for the patient.    Prior Therapy: 1. Radiation through 11/10/2017. 2. RVD x 5 cycles from 11/14/2017 to 02/19/2018. 3. Stem cell transplant on 04/03/2018.  Current Therapy:  Maintenance Revlimid 10 mg 3/4 weeks    HISTORY OF PRESENT ILLNESS:   Oncology History  Multiple myeloma (HCC)  10/10/2017 Initial Diagnosis   Multiple myeloma (HCC)   11/14/2017 - 02/26/2018 Chemotherapy   The patient had dexamethasone (DECADRON) tablet 40 mg, 40 mg (100 % of original dose 40 mg), Oral,  Once, 1 of 1 cycle Dose modification: 40 mg (original dose 40 mg, Cycle 1) Administration: 40 mg (11/14/2017) dexamethasone (DECADRON) 4 MG tablet, 1 of 1 cycle, Start date: 11/02/2017, End date: 03/12/2018 lenalidomide (REVLIMID) 25 MG capsule, 1 of 1 cycle, Start date: 02/28/2018, End date: 03/12/2018 bortezomib SQ (VELCADE) chemo injection 2.5 mg, 1.3 mg/m2 = 2.5 mg, Subcutaneous,  Once, 5 of 6 cycles Administration: 2.5 mg (11/14/2017), 2.5 mg (11/21/2017), 2.5 mg (11/28/2017), 2.5 mg (12/14/2017), 2.5 mg (12/21/2017), 2.5 mg (12/28/2017), 2.5 mg (01/04/2018), 2.5 mg (01/11/2018), 2.5 mg (01/18/2018), 2.5 mg (01/25/2018), 2.5 mg (02/01/2018), 2.5 mg (02/08/2018), 2.5 mg (02/19/2018), 2.5 mg (02/26/2018)  for chemotherapy treatment.       INTERVAL HISTORY:   Jamie Ewing is a 82 y.o. female presenting to clinic today for follow up of multiple myeloma. She was last seen by me on 09/13/22.  Since her last visit, she underwent MRI of the brain 10/06/22 showing: no evidence of an acute  intracranial abnormality; no evidence of intracranial myeloma involvement; and mild chronic small vessel ischemic changes within the cerebral white matter.  Today, she states that she is doing well overall. Her appetite level is at 90%. Her energy level is at 75%.  PAST MEDICAL HISTORY:   Past Medical History: Past Medical History:  Diagnosis Date   Back pain    Hypercholesteremia    Hypertension     Surgical History: Past Surgical History:  Procedure Laterality Date   BONE MARROW BIOPSY     LAMINECTOMY N/A 10/06/2017   Procedure: THORACIC NINE AND TEN LAMINECTOMY WITH RESECTION OF TUMOR, THORACIC EIGHT TO THORACIC ELEVEN FUSION WITH PEDICLE SCREW FIXATION;  Surgeon: Julio Sicks, MD;  Location: [facility] OR;  Service: Neurosurgery;  Laterality: N/A;   REPLACEMENT TOTAL KNEE Left 2003   TOTAL KNEE ARTHROPLASTY  2001    Social History: Social History   Socioeconomic History   Marital status: Widowed  Spouse name: Not on file   Number of children: 2   Years of education: Not on file   Highest education level: Not on file  Occupational History   Not on file  Tobacco Use   Smoking status: Former    Current packs/day: 0.00    Average packs/day: 0.5 packs/day for 30.0 years (15.0 ttl pk-yrs)    Types: Cigarettes    Start date: 05/30/1976    Quit date: 05/31/2006    Years since quitting: 16.3   Smokeless tobacco: Never  Vaping Use   Vaping status: Never Used  Substance and Sexual Activity   Alcohol use: Yes    Comment: wine occassionally   Drug use: No   Sexual activity: Not on file  Other Topics Concern   Not on file  Social History Narrative   Not on file   Social Determinants of Health   Financial Resource Strain: Low Risk  (10/19/2017)   Overall Financial Resource Strain (CARDIA)    Difficulty of Paying Living Expenses: Not very hard  Food Insecurity: No Food Insecurity (10/19/2017)   Hunger Vital Sign    Worried About Running Out of Food in the Last Year: Never true     Ran Out of Food in the Last Year: Never true  Transportation Needs: No Transportation Needs (10/19/2017)   PRAPARE - Administrator, Civil Service (Medical): No    Lack of Transportation (Non-Medical): No  Physical Activity: Sufficiently Active (10/19/2017)   Exercise Vital Sign    Days of Exercise per Week: 4 days    Minutes of Exercise per Session: 120 min  Stress: No Stress Concern Present (10/19/2017)   [employer] of Occupational Health - Occupational Stress Questionnaire    Feeling of Stress : Not at all  Social Connections: Moderately Integrated (10/19/2017)   Social Connection and Isolation Panel [NHANES]    Frequency of Communication with Friends and Family: More than three times a week    Frequency of Social Gatherings with Friends and Family: Once a week    Attends Religious Services: More than 4 times per year    Active Member of [employer] or Organizations: Yes    Attends Banker Meetings: More than 4 times per year    Marital Status: Widowed  Intimate Partner Violence: Not At Risk (10/19/2017)   Humiliation, Afraid, Rape, and Kick questionnaire    Fear of Current or Ex-Partner: No    Emotionally Abused: No    Physically Abused: No    Sexually Abused: No    Family History: Family History  Problem Relation Age of Onset   Tuberculosis Mother    Kidney failure Father    Hypertension Paternal Aunt    Diabetes Paternal Aunt    Hypertension Paternal Uncle    Diabetes Paternal Uncle    Hypertension Daughter    Stroke Daughter     Current Medications:  Current Outpatient Medications:    acetaminophen (TYLENOL) 500 MG tablet, Take by mouth., Disp: , Rfl:    amLODipine (NORVASC) 5 MG tablet, Take 5 mg by mouth daily. , Disp: , Rfl:    calcium carbonate (OSCAL) 1500 (600 Ca) MG TABS tablet, Take by mouth., Disp: , Rfl:    gabapentin (NEURONTIN) 300 MG capsule, Take 1 capsule (300 mg total) by mouth 2 (two) times daily., Disp: 60 capsule, Rfl: 3    hydrochlorothiazide (HYDRODIURIL) 25 MG tablet, Take 25 mg by mouth daily., Disp: , Rfl:    lenalidomide (REVLIMID)  10 MG capsule, Take 1 capsule (10 mg total) by mouth daily. Take for 21 days, then hold for 7 days. Repeat every 28 days., Disp: 21 capsule, Rfl: 0   LORazepam (ATIVAN) 0.5 MG tablet, Take 0.25-0.5 mg by mouth 2 (two) times daily as needed for anxiety., Disp: , Rfl:    LOW-DOSE ASPIRIN PO, Asprin Ec Low Dose, Disp: , Rfl:    Multiple Vitamins-Minerals (THERA-M) TABS, Thera-M 9 mg iron-400 mcg tablet, Disp: , Rfl:    ondansetron (ZOFRAN) 4 MG tablet, , Disp: , Rfl:    polyethylene glycol (MIRALAX / GLYCOLAX) packet, Take 17 g by mouth as needed. , Disp: , Rfl:    potassium chloride SA (KLOR-CON M) 20 MEQ tablet, TAKE 1 TABLET TWICE DAILY, Disp: 180 tablet, Rfl: 3   vitamin B-12 (CYANOCOBALAMIN) 1000 MCG tablet, Vitamin B12, Disp: , Rfl:    Allergies: No Known Allergies  REVIEW OF SYSTEMS:   Review of Systems  Constitutional:  Negative for chills, fatigue and fever.  HENT:   Negative for lump/mass, mouth sores, nosebleeds, sore throat and trouble swallowing.   Eyes:  Negative for eye problems.  Respiratory:  Negative for cough and shortness of breath.   Cardiovascular:  Negative for chest pain, leg swelling and palpitations.  Gastrointestinal:  Positive for constipation. Negative for abdominal pain, diarrhea, nausea and vomiting.  Genitourinary:  Negative for bladder incontinence, difficulty urinating, dysuria, frequency, hematuria and nocturia.   Musculoskeletal:  Negative for arthralgias, back pain, flank pain, myalgias and neck pain.  Skin:  Negative for itching and rash.  Neurological:  Positive for numbness. Negative for dizziness and headaches.  Hematological:  Does not bruise/bleed easily.  Psychiatric/Behavioral:  Negative for depression, sleep disturbance and suicidal ideas. The patient is not nervous/anxious.   All other systems reviewed and are negative.     VITALS:   Blood pressure 125/67, pulse 93, temperature 99.2 F (37.3 C), temperature source Tympanic, resp. rate 18, weight 156 lb 12.8 oz (71.1 kg), SpO2 98%.  Wt Readings from Last 3 Encounters:  10/11/22 156 lb 12.8 oz (71.1 kg)  09/13/22 155 lb 12.8 oz (70.7 kg)  06/09/22 165 lb 9.6 oz (75.1 kg)    Body mass index is 26.5 kg/m.  Performance status (ECOG): 1 - Symptomatic but completely ambulatory  PHYSICAL EXAM:   Physical Exam Vitals and nursing note reviewed. Exam conducted with a chaperone present.  Constitutional:      Appearance: Normal appearance.  Cardiovascular:     Rate and Rhythm: Normal rate and regular rhythm.     Pulses: Normal pulses.     Heart sounds: Normal heart sounds.  Pulmonary:     Effort: Pulmonary effort is normal.     Breath sounds: Normal breath sounds.  Abdominal:     Palpations: Abdomen is soft. There is no hepatomegaly, splenomegaly or mass.     Tenderness: There is no abdominal tenderness.  Musculoskeletal:     Right lower leg: No edema.     Left lower leg: No edema.  Lymphadenopathy:     Cervical: No cervical adenopathy.     Right cervical: No superficial, deep or posterior cervical adenopathy.    Left cervical: No superficial, deep or posterior cervical adenopathy.     Upper Body:     Right upper body: No supraclavicular or axillary adenopathy.     Left upper body: No supraclavicular or axillary adenopathy.  Neurological:     General: No focal deficit present.     Mental  Status: She is alert and oriented to person, place, and time.  Psychiatric:        Mood and Affect: Mood normal.        Behavior: Behavior normal.     LABS:      Latest Ref Rng & Units 09/06/2022    2:50 PM 08/14/2022    9:35 AM 08/01/2022    9:14 AM  CBC  WBC 4.0 - 10.5 K/uL 3.3  3.0  3.1   Hemoglobin 12.0 - 15.0 g/dL 16.1  09.6  04.5   Hematocrit 36.0 - 46.0 % 36.5  36.7  38.4   Platelets 150 - 400 K/uL 199  188  179       Latest Ref Rng & Units  09/06/2022    2:50 PM 08/14/2022    9:35 AM 08/01/2022    9:14 AM  CMP  Glucose 70 - 99 mg/dL 409  811  914   BUN 8 - 23 mg/dL 18  15  17    Creatinine 0.44 - 1.00 mg/dL 7.82  9.56  2.13   Sodium 135 - 145 mmol/L 134  134  134   Potassium 3.5 - 5.1 mmol/L 3.5  3.7  3.0   Chloride 98 - 111 mmol/L 100  99  98   CO2 22 - 32 mmol/L 22  19  26    Calcium 8.9 - 10.3 mg/dL 9.2  9.3  9.6   Total Protein 6.5 - 8.1 g/dL 7.8  8.0  8.2   Total Bilirubin 0.3 - 1.2 mg/dL 0.6  0.8  1.0   Alkaline Phos 38 - 126 U/L 43  44  50   AST 15 - 41 U/L 20  20  19    ALT 0 - 44 U/L 21  26  27       No results found for: "CEA1", "CEA" / No results found for: "CEA1", "CEA" No results found for: "PSA1" No results found for: "CAN199" No results found for: "CAN125"  Lab Results  Component Value Date   TOTALPROTELP 7.4 09/06/2022   TOTALPROTELP 7.4 09/06/2022   ALBUMINELP 3.6 09/06/2022   A1GS 0.2 09/06/2022   A2GS 0.9 09/06/2022   BETS 1.3 09/06/2022   GAMS 1.4 09/06/2022   MSPIKE Not Observed 09/06/2022   SPEI Comment 09/06/2022   Lab Results  Component Value Date   TIBC 186 (L) 11/14/2017   FERRITIN 455 (H) 11/14/2017   IRONPCTSAT 20 11/14/2017   Lab Results  Component Value Date   LDH 109 03/03/2022   LDH 112 12/09/2021   LDH 100 09/08/2021     STUDIES:   MR Brain W Wo Contrast  Result Date: 10/07/2022 CLINICAL DATA:  Provided history: Multiple myeloma in remission. Weakness of lower extremity, unspecified laterality. Hematologic malignancy, monitor. Weakness in lower extremities and right eye drooping. EXAM: MRI HEAD WITHOUT AND WITH CONTRAST TECHNIQUE: Multiplanar, multiecho pulse sequences of the brain and surrounding structures were obtained without and with intravenous contrast. CONTRAST:  7mL GADAVIST GADOBUTROL 1 MMOL/ML IV SOLN COMPARISON:  Head CT 07/01/2003. FINDINGS: Brain: No age advanced or lobar predominant parenchymal atrophy. Multifocal T2 FLAIR hyperintense signal abnormality  within the cerebral white matter, nonspecific but compatible with mild chronic small vessel ischemic disease. There is no acute infarct. No evidence of an intracranial mass. No chronic intracranial blood products. No extra-axial fluid collection. No midline shift. No pathologic intracranial enhancement identified. Vascular: Maintained flow voids within the proximal large arterial vessels. Skull and upper cervical spine: No focal suspicious  marrow lesion. Incompletely assessed cervical spondylosis. Sinuses/Orbits: No mass or acute finding within the imaged orbits. Prior bilateral ocular lens replacement. Minimal mucosal thickening within a left ethmoid air cell. IMPRESSION: 1. No evidence of an acute intracranial abnormality. 2. No evidence of intracranial myeloma involvement. 3. Mild chronic small vessel ischemic changes within the cerebral white matter. Electronically Signed   By: Jackey Loge D.O.   On: 10/07/2022 21:03   NM PET Image Restag (PS) Skull Base To Thigh  Result Date: 10/05/2022 CLINICAL DATA:  Subsequent treatment strategy for multiple myeloma. EXAM: NUCLEAR MEDICINE PET SKULL BASE TO THIGH TECHNIQUE: 8.1 mCi F-18 FDG was injected intravenously. Full-ring PET imaging was performed from the skull base to thigh after the radiotracer. CT data was obtained and used for attenuation correction and anatomic localization. Fasting blood glucose: 106 mg/dl COMPARISON:  MRI total spine dated 09/07/2022. FINDINGS: Mediastinal blood pool activity: SUV max 2.9 Liver activity: SUV max NA NECK: No hypermetabolic cervical lymphadenopathy. Incidental CT findings: None. CHEST: No suspicious pulmonary nodules. No hypermetabolic thoracic lymphadenopathy. Incidental CT findings: Atherosclerotic calcifications of the aortic arch. ABDOMEN/PELVIS: No abnormal hypermetabolism in the liver, spleen, pancreas, or adrenal glands. No hypermetabolic abdominopelvic lymphadenopathy. Incidental CT findings: 4.9 cm simple right  upper pole renal cyst. Atherosclerotic calcifications of the abdominal aorta and branch vessels. SKELETON: Expansile lytic lesion in the right posterior 10th rib (series 3/image 165), max SUV 8.4. Focal hypermetabolism in the left mid femoral shaft, with possible intramedullary soft tissue (series 3/image 19) but without corresponding lytic lesion, max SUV 9.7. Incidental CT findings: Degenerative changes of the visualized thoracolumbar spine. Expansile sclerosis of the left posteromedial 4th rib. Prior mid/lower thoracic spine fixation hardware. Degenerative changes of the bilateral hips. IMPRESSION: Active myeloma involving the right posterior 10th rib. Focal hypermetabolism in the left mid femoral shaft may reflect additional site of myeloma, although without corresponding lytic lesion on CT. Electronically Signed   By: Charline Bills M.D.   On: 10/05/2022 03:01

## 2022-10-12 DIAGNOSIS — M47814 Spondylosis without myelopathy or radiculopathy, thoracic region: Secondary | ICD-10-CM | POA: Diagnosis not present

## 2022-10-12 DIAGNOSIS — M5416 Radiculopathy, lumbar region: Secondary | ICD-10-CM | POA: Diagnosis not present

## 2022-10-12 DIAGNOSIS — M4314 Spondylolisthesis, thoracic region: Secondary | ICD-10-CM | POA: Diagnosis not present

## 2022-10-12 DIAGNOSIS — M545 Low back pain, unspecified: Secondary | ICD-10-CM | POA: Diagnosis not present

## 2022-10-12 DIAGNOSIS — M546 Pain in thoracic spine: Secondary | ICD-10-CM | POA: Diagnosis not present

## 2022-10-12 DIAGNOSIS — M5116 Intervertebral disc disorders with radiculopathy, lumbar region: Secondary | ICD-10-CM | POA: Diagnosis not present

## 2022-10-12 DIAGNOSIS — M4714 Other spondylosis with myelopathy, thoracic region: Secondary | ICD-10-CM | POA: Diagnosis not present

## 2022-10-12 DIAGNOSIS — M48061 Spinal stenosis, lumbar region without neurogenic claudication: Secondary | ICD-10-CM | POA: Diagnosis not present

## 2022-10-12 DIAGNOSIS — M4804 Spinal stenosis, thoracic region: Secondary | ICD-10-CM | POA: Diagnosis not present

## 2022-10-13 NOTE — Progress Notes (Signed)
Histology and Location of Primary Cancer: Active myeloma involving the right posterior 10th rib.      Location(s) of Symptomatic tumor(s): right post rib and left mid femur lighting up on PET scan  NM PET Image Restag (PS) Skull Base To Thigh 09/29/2022  IMPRESSION: Active myeloma involving the right posterior 10th rib.   Focal hypermetabolism in the left mid femoral shaft may reflect additional site of myeloma, although without corresponding lytic lesion on CT.   MR TOTAL SPINE METS SCREENING 09/07/2022  IMPRESSION: 1. No evidence of focal myeloma in the cervical, thoracic, or lumbar spine. 2. Postoperative changes of T9-11 laminectomy with T8-11 posterior spinal fusion. 3. Likely treated disease in the left 4th rib. 4. Multilevel spondylosis in the cervical, thoracic, and lumbar spine without high-grade spinal canal stenosis or neural foraminal narrowing. 5. Chronic appearing compression deformity of the L4 superior endplate with mild anterior height loss.  Past/Anticipated chemotherapy by medical oncology, if any:  [name], MD 10/11/2022   Assessment: 1.  Stage II IgA kappa multiple myeloma, standard risk: -XRT to the spine completed on November 10, 2017 following T9-T10 laminectomy with resection of epidural tumor, T8-T11 posterior fusion by Dr. Jordan Likes. -5 cycles of RVD from 11/14/2017 through 02/19/2018 followed by stem cell transplant on April 03, 2018. -Revlimid maintenance therapy 10 mg 3 weeks on/1 week off started on August 24, 2018. -PET scan on 09/02/2019 showed redemonstrated bilateral cervical, right supraclavicular, right axillary hypermetabolic adenopathy with interval resolution of left axillary adenopathy.  Similar lytic and sclerotic lesions in bones with no corresponding FDG uptake.  Persistent increased FDG uptake in both palatine tonsils. -Reviewed myeloma panel from 09/26/2019.  SPEP and immunofixation is negative.  Free light chain ratio is normal with  kappa light chains elevated at 32.5. -Right neck lymph node biopsy on 10/21/2019 at [facility] was negative for malignancy.   2.  Myeloma bone disease: -Zometa was started on 11/21/2017, resumed after transplant on 09/06/2018.     Plan: 1.  Stage II IgA kappa multiple myeloma, standard risk: - At last visit she complained of generalized weakness for about 3 months.  She is having to use walker all the time.  Whereas previously she was walking with a cane.  She also reported decreased appetite and lost about 10 pounds. - Myeloma labs on 09/06/2022: M spike was negative.  FLC ratio was 1.41 with elevated kappa light chains.  IgA level was also elevated.  Immunofixation was polyclonal. - Maintenance Revlimid was held on 09/13/2022. - Since then she has noticed improvement in energy levels. - I reviewed MRI of the brain from 10/06/2022: No significant pathology. - I reviewed PET scan from 09/29/2022: Right posterior 10th rib hypermetabolism.  Focal hypermetabolic some in the left mid femur shaft. - Recommend radiation oncology consultation for radiation of these 2 active lesions.  - Will continue to hold Revlimid at this time. - She has seen Dr. Dutch Quint, who has ordered MRI of her spine which will be done tomorrow.  This is to see what is causing her weakness in the legs.  I have reviewed records from his office. - I will see her back in 6 weeks for follow-up with repeat myeloma labs.    2.  Myeloma bone disease: - Zometa for 3 years completed on 03/10/2022.   3.  Thromboprophylaxis: - Continue aspirin 81 mg daily.   4.  Hypertension: - Continue amlodipine and HCTZ.  Blood pressure today is 124/67.   5.  Peripheral  neuropathy: - Continue gabapentin 300 mg twice daily.  Tingling and numbness in the hands and feet is stable.  Patient's main complaints related to symptomatic tumor(s) are: This is to see what is causing her weakness in the legs   Pain on a scale of 0-10 is: No   If Spine  Met(s), symptoms, if any, include: Bowel/Bladder retention or incontinence (please describe): None Numbness or weakness in extremities (please describe): She reports some baseline weakness. Current Decadron regimen, if applicable:   Ambulatory status? Walker? Wheelchair?: Ambulatory  SAFETY ISSUES: Prior radiation? 10 fractions to T9-T10, 2019 with Dr. Kathrynn Running. Pacemaker/ICD? No Possible current pregnancy? no Is the patient on methotrexate? No   Additional Complaints / other details:

## 2022-10-25 NOTE — Progress Notes (Signed)
Radiation Oncology         [phone] ________________________________  Initial Outpatient Consultation  Name: Jamie Ewing MRN: [id number]  Date: 10/26/2022  DOB: Oct 11, 1940  ZY:SAYTK, Channing Mutters, MD  [name], MD   REFERRING PHYSICIAN: [name], MD  DIAGNOSIS: The primary encounter diagnosis was Kappa light chain myeloma (HCC). Diagnoses of Secondary malignant neoplasm of bone (HCC) and Multiple myeloma in relapse [facility]) were also pertinent to this visit.  Two new sites of active myeloma in the right 10th rib and left femoral shaft   Initially diagnosed with Stage II IgA kappa multiple myeloma in 2019, s/p: T9-T10 laminectomy with resection of an epidural tumor and T8-T11 posterior fusion, adjuvant radiation therapy to the spine completed in August 2019, 5 cycles of RVD completed in December 2019, maintenance Revlimid (since 08/24/2018), and a stem cell transplant on 04/03/2018    HISTORY OF PRESENT ILLNESS::Jamie Ewing is a 82 y.o. female who is accompanied by her daughter. she is seen as a courtesy of Dr. Ellin Saba for an opinion concerning radiation therapy as part of management for her two new sites of active myeloma in the right 10th rib and left femoral shaft.    As noted above, the patient was initially diagnosed with IgA kappa multiple myeloma in 2019, s/p T9-T10 laminectomy with resection of an epidural tumor and T8-T11 posterior fusion under the care of  Dr. Jordan Likes, followed by adjuvant radiation therapy to the spine completed in August 2019 under Dr. Kathrynn Running. This was followed by 5 cycles of RVD from 11/14/2017 through 02/19/2018 under the care Dr. Ellin Saba, and maintenance Revlimid which she began on 08/24/2018. She also received a stem cell transplant on 04/03/2018.  Of note: Labs in July 2021 showed elevated kappa light chains. She subsequently underwent a right cervical lymph node biopsy on 10/21/22 at [facility] which was negative for malignancy.   Since  completing primary treatment, she has continued to follow with Dr. Ellin Saba for surveillance of her disease and receive maintenance therapy with Revlimid which she tolerated well overall. Her history pertaining to her recent recurrence of myeloma is detailed as follows.   During a follow-up visit with Dr. Ellin Saba on 09/13/22, the patient endorsed 3 months of generalized weakness resulting is impaired ambulation, and a decrease in appetite resulting in 10 pound weight loss.   Based on her symptoms, Revlimid was held and a restaging PET scan was performed on 09/29/22 which unfortunately demonstrated evidence of active myeloma involving the right posterior 10th rib, and a focal hypermetabolism in the left mid femoral shaft concerning for another possible additional site of myeloma (though without an appreciable corresponding lytic lesion)   MRI of the brain was also performed on 10/06/22 which showed no evidence of intracranial myeloma involvement or acute abnormalities overall.   Since her Remlivid was held on 09/13/22, the patient has noticed an improvement in her energy levels. She does however continue to have weakness in her legs. For further evaluation of this, a repeat MRI of the total spine has been ordered. She did have a total spine MRI performed recently on 09/07/22 which showed no evidence of focal myeloma in the cervical, thoracic, or lumbar spine. Imaging did show: multilevel spondylosis in the cervical, thoracic, and lumbar spine without high-grade spinal canal stenosis or neural foraminal narrowing, postoperative changes in the thoracic spine, and a chronic appearing compression deformity of the L4 superior endplate with mild anterior height loss.   For the 2 new active lesions in  the right 10th rib and left femoral shaft, Dr. Ellin Saba recommends radiation therapy which we will discuss in detail today.     PREVIOUS RADIATION THERAPY: Yes - Dr. Kathrynn Running   Diagnosis: Multiple myeloma  s/p dorsal laminectomy and decompression for spinal cord compression at T9-T10    Indication for treatment:  Palliative      Radiation treatment dates:   10/30/17 - 11/10/17 Site/dose:   The thoracic spine target at T9-T10 was treated to 20 Gy in 10 fractions of 2 Gy. Beams/energy:   3D, Photons/ 10X  PAST MEDICAL HISTORY:  Past Medical History:  Diagnosis Date   Back pain    Hypercholesteremia    Hypertension     PAST SURGICAL HISTORY: Past Surgical History:  Procedure Laterality Date   BONE MARROW BIOPSY     LAMINECTOMY N/A 10/06/2017   Procedure: THORACIC NINE AND TEN LAMINECTOMY WITH RESECTION OF TUMOR, THORACIC EIGHT TO THORACIC ELEVEN FUSION WITH PEDICLE SCREW FIXATION;  Surgeon: Julio Sicks, MD;  Location: [facility] OR;  Service: Neurosurgery;  Laterality: N/A;   REPLACEMENT TOTAL KNEE Left 2003   TOTAL KNEE ARTHROPLASTY  2001    FAMILY HISTORY:  Family History  Problem Relation Age of Onset   Tuberculosis Mother    Kidney failure Father    Hypertension Paternal Aunt    Diabetes Paternal Aunt    Hypertension Paternal Uncle    Diabetes Paternal Uncle    Hypertension Daughter    Stroke Daughter     SOCIAL HISTORY:  Social History   Tobacco Use   Smoking status: Former    Current packs/day: 0.00    Average packs/day: 0.5 packs/day for 30.0 years (15.0 ttl pk-yrs)    Types: Cigarettes    Start date: 05/30/1976    Quit date: 05/31/2006    Years since quitting: 16.4   Smokeless tobacco: Never  Vaping Use   Vaping status: Never Used  Substance Use Topics   Alcohol use: Yes    Comment: wine occassionally   Drug use: No    ALLERGIES: No Known Allergies  MEDICATIONS:  Current Outpatient Medications  Medication Sig Dispense Refill   acetaminophen (TYLENOL) 500 MG tablet Take by mouth.     amLODipine (NORVASC) 5 MG tablet Take 5 mg by mouth daily.      calcium carbonate (OSCAL) 1500 (600 Ca) MG TABS tablet Take by mouth.     gabapentin (NEURONTIN) 300 MG capsule Take 1  capsule (300 mg total) by mouth 2 (two) times daily. 60 capsule 3   hydrochlorothiazide (HYDRODIURIL) 25 MG tablet Take 25 mg by mouth daily.     lenalidomide (REVLIMID) 10 MG capsule Take 1 capsule (10 mg total) by mouth daily. Take for 21 days, then hold for 7 days. Repeat every 28 days. 21 capsule 0   LORazepam (ATIVAN) 0.5 MG tablet Take 0.25-0.5 mg by mouth 2 (two) times daily as needed for anxiety.     LOW-DOSE ASPIRIN PO Asprin Ec Low Dose     Multiple Vitamins-Minerals (THERA-M) TABS Thera-M 9 mg iron-400 mcg tablet     ondansetron (ZOFRAN) 4 MG tablet      polyethylene glycol (MIRALAX / GLYCOLAX) packet Take 17 g by mouth as needed.      potassium chloride SA (KLOR-CON M) 20 MEQ tablet TAKE 1 TABLET TWICE DAILY 180 tablet 3   vitamin B-12 (CYANOCOBALAMIN) 1000 MCG tablet Vitamin B12     No current facility-administered medications for this encounter.    REVIEW OF SYSTEMS:  A 10+ POINT REVIEW OF SYSTEMS WAS OBTAINED including neurology, dermatology, psychiatry, cardiac, respiratory, lymph, extremities, GI, GU, musculoskeletal, constitutional, reproductive, HEENT.  She reports some pain/discomfort along the right lateral lower rib cage area.  In addition she notices some discomfort in the left thigh area with standing and walking.  She reports being overall weak especially in her left lower leg and she will be seeing neurosurgery for evaluation of this issue.  She ambulates very little in light of her weakness.  She uses a walker to ambulate.   PHYSICAL EXAM:  height is 5' 4.5" (1.638 m) and weight is 156 lb 8 oz (71 kg). Her temporal temperature is 96.9 F (36.1 C) (abnormal). Her blood pressure is 131/69 and her pulse is 84. Her respiration is 18 and oxygen saturation is 100%.   General: Alert and oriented, in no acute distress HEENT: Head is normocephalic. Extraocular movements are intact.  Neck: Neck is supple, no palpable cervical or supraclavicular lymphadenopathy. Heart: Regular in  rate and rhythm with no murmurs, rubs, or gallops. Chest: Clear to auscultation bilaterally, with no rhonchi, wheezes, or rales. Abdomen: Soft, nontender, nondistended, with no rigidity or guarding. Extremities: No cyanosis or edema. Lymphatics: see Neck Exam Skin: No concerning lesions. Musculoskeletal: symmetric strength and muscle tone throughout. Neurologic: Cranial nerves II through XII are grossly intact. No obvious focalities. Speech is fluent. Coordination is intact. Psychiatric: Judgment and insight are intact. Affect is appropriate.   ECOG = 2  0 - Asymptomatic (Fully active, able to carry on all predisease activities without restriction)  1 - Symptomatic but completely ambulatory (Restricted in physically strenuous activity but ambulatory and able to carry out work of a light or sedentary nature. For example, light housework, office work)  2 - Symptomatic, <50% in bed during the day (Ambulatory and capable of all self care but unable to carry out any work activities. Up and about more than 50% of waking hours)  3 - Symptomatic, >50% in bed, but not bedbound (Capable of only limited self-care, confined to bed or chair 50% or more of waking hours)  4 - Bedbound (Completely disabled. Cannot carry on any self-care. Totally confined to bed or chair)  5 - Death   Santiago Glad MM, Creech RH, Tormey DC, et al. [phone]). "Toxicity and response criteria of the [facility]". Am. Evlyn Clines. Oncol. 5 (6): 649-55  LABORATORY DATA:  Lab Results  Component Value Date   WBC 3.3 (L) 09/06/2022   HGB 12.1 09/06/2022   HCT 36.5 09/06/2022   MCV 101.4 (H) 09/06/2022   PLT 199 09/06/2022   NEUTROABS 1.5 (L) 09/06/2022   Lab Results  Component Value Date   NA 134 (L) 09/06/2022   K 3.5 09/06/2022   CL 100 09/06/2022   CO2 22 09/06/2022   GLUCOSE 103 (H) 09/06/2022   BUN 18 09/06/2022   CREATININE 0.71 09/06/2022   CALCIUM 9.2 09/06/2022      RADIOGRAPHY: MR Brain W Wo  Contrast  Result Date: 10/07/2022 CLINICAL DATA:  Provided history: Multiple myeloma in remission. Weakness of lower extremity, unspecified laterality. Hematologic malignancy, monitor. Weakness in lower extremities and right eye drooping. EXAM: MRI HEAD WITHOUT AND WITH CONTRAST TECHNIQUE: Multiplanar, multiecho pulse sequences of the brain and surrounding structures were obtained without and with intravenous contrast. CONTRAST:  7mL GADAVIST GADOBUTROL 1 MMOL/ML IV SOLN COMPARISON:  Head CT 07/01/2003. FINDINGS: Brain: No age advanced or lobar predominant parenchymal atrophy. Multifocal T2 FLAIR hyperintense signal abnormality within  the cerebral white matter, nonspecific but compatible with mild chronic small vessel ischemic disease. There is no acute infarct. No evidence of an intracranial mass. No chronic intracranial blood products. No extra-axial fluid collection. No midline shift. No pathologic intracranial enhancement identified. Vascular: Maintained flow voids within the proximal large arterial vessels. Skull and upper cervical spine: No focal suspicious marrow lesion. Incompletely assessed cervical spondylosis. Sinuses/Orbits: No mass or acute finding within the imaged orbits. Prior bilateral ocular lens replacement. Minimal mucosal thickening within a left ethmoid air cell. IMPRESSION: 1. No evidence of an acute intracranial abnormality. 2. No evidence of intracranial myeloma involvement. 3. Mild chronic small vessel ischemic changes within the cerebral white matter. Electronically Signed   By: Jackey Loge D.O.   On: 10/07/2022 21:03   NM PET Image Restag (PS) Skull Base To Thigh  Result Date: 10/05/2022 CLINICAL DATA:  Subsequent treatment strategy for multiple myeloma. EXAM: NUCLEAR MEDICINE PET SKULL BASE TO THIGH TECHNIQUE: 8.1 mCi F-18 FDG was injected intravenously. Full-ring PET imaging was performed from the skull base to thigh after the radiotracer. CT data was obtained and used for  attenuation correction and anatomic localization. Fasting blood glucose: 106 mg/dl COMPARISON:  MRI total spine dated 09/07/2022. FINDINGS: Mediastinal blood pool activity: SUV max 2.9 Liver activity: SUV max NA NECK: No hypermetabolic cervical lymphadenopathy. Incidental CT findings: None. CHEST: No suspicious pulmonary nodules. No hypermetabolic thoracic lymphadenopathy. Incidental CT findings: Atherosclerotic calcifications of the aortic arch. ABDOMEN/PELVIS: No abnormal hypermetabolism in the liver, spleen, pancreas, or adrenal glands. No hypermetabolic abdominopelvic lymphadenopathy. Incidental CT findings: 4.9 cm simple right upper pole renal cyst. Atherosclerotic calcifications of the abdominal aorta and branch vessels. SKELETON: Expansile lytic lesion in the right posterior 10th rib (series 3/image 165), max SUV 8.4. Focal hypermetabolism in the left mid femoral shaft, with possible intramedullary soft tissue (series 3/image 19) but without corresponding lytic lesion, max SUV 9.7. Incidental CT findings: Degenerative changes of the visualized thoracolumbar spine. Expansile sclerosis of the left posteromedial 4th rib. Prior mid/lower thoracic spine fixation hardware. Degenerative changes of the bilateral hips. IMPRESSION: Active myeloma involving the right posterior 10th rib. Focal hypermetabolism in the left mid femoral shaft may reflect additional site of myeloma, although without corresponding lytic lesion on CT. Electronically Signed   By: Charline Bills M.D.   On: 10/05/2022 03:01      IMPRESSION: Two new sites of active myeloma in the right 10th rib and left femoral shaft   Initially diagnosed with Stage II IgA kappa multiple myeloma in 2019, s/p: T9-T10 laminectomy with resection of an epidural tumor and T8-T11 posterior fusion, adjuvant radiation therapy to the spine completed in August 2019, 5 cycles of RVD completed in December 2019, maintenance Revlimid (since 08/24/2018), and a stem cell  transplant on 04/03/2018    She would be a good candidate for a short  course of radiation therapy directed at the right 10th rib and left femoral shaft.  Today, I talked to the patient and daughter about the findings and work-up thus far.  We discussed the natural history of multiple myeloma and general treatment, highlighting the role of radiotherapy in the management.  We discussed the available radiation techniques, and focused on the details of logistics and delivery.  We reviewed the anticipated acute and late sequelae associated with radiation in this setting.  The patient was encouraged to ask questions that I answered to the best of my ability.  A patient consent form was discussed and signed.  We  retained a copy for our records.  The patient would like to proceed with radiation and will be scheduled for CT simulation.  PLAN: She will return tomorrow for CT simulation with treatment to begin in approximately a week.  Anticipate 10 treatments directed at the right 10th rib and left femoral shaft.   60 minutes of total time was spent for this patient encounter, including preparation, face-to-face counseling with the patient and coordination of care, physical exam, and documentation of the encounter.   ------------------------------------------------  Billie Lade, PhD, MD  This document serves as a record of services personally performed by Antony Blackbird, MD. It was created on his behalf by Neena Rhymes, a trained medical scribe. The creation of this record is based on the scribe's personal observations and the provider's statements to them. This document has been checked and approved by the attending provider.

## 2022-10-26 ENCOUNTER — Ambulatory Visit
Admission: RE | Admit: 2022-10-26 | Discharge: 2022-10-26 | Disposition: A | Discharge status: Home / Self Care | Payer: Medicare HMO | Source: Ambulatory Visit | Attending: Radiation Oncology | Admitting: Radiation Oncology

## 2022-10-26 ENCOUNTER — Other Ambulatory Visit: Payer: Self-pay

## 2022-10-26 VITALS — BP 131/69 | HR 84 | Temp 96.9°F | Resp 18 | Ht 64.5 in | Wt 156.5 lb

## 2022-10-26 DIAGNOSIS — M47812 Spondylosis without myelopathy or radiculopathy, cervical region: Secondary | ICD-10-CM | POA: Insufficient documentation

## 2022-10-26 DIAGNOSIS — C9002 Multiple myeloma in relapse: Secondary | ICD-10-CM | POA: Insufficient documentation

## 2022-10-26 DIAGNOSIS — Z923 Personal history of irradiation: Secondary | ICD-10-CM | POA: Insufficient documentation

## 2022-10-26 DIAGNOSIS — Z79899 Other long term (current) drug therapy: Secondary | ICD-10-CM | POA: Insufficient documentation

## 2022-10-26 DIAGNOSIS — I10 Essential (primary) hypertension: Secondary | ICD-10-CM | POA: Insufficient documentation

## 2022-10-26 DIAGNOSIS — M47814 Spondylosis without myelopathy or radiculopathy, thoracic region: Secondary | ICD-10-CM | POA: Diagnosis not present

## 2022-10-26 DIAGNOSIS — I700 Atherosclerosis of aorta: Secondary | ICD-10-CM | POA: Diagnosis not present

## 2022-10-26 DIAGNOSIS — C7951 Secondary malignant neoplasm of bone: Secondary | ICD-10-CM

## 2022-10-26 DIAGNOSIS — C9000 Multiple myeloma not having achieved remission: Secondary | ICD-10-CM

## 2022-10-26 DIAGNOSIS — Z9221 Personal history of antineoplastic chemotherapy: Secondary | ICD-10-CM | POA: Insufficient documentation

## 2022-10-26 DIAGNOSIS — N281 Cyst of kidney, acquired: Secondary | ICD-10-CM | POA: Diagnosis not present

## 2022-10-26 DIAGNOSIS — E7800 Pure hypercholesterolemia, unspecified: Secondary | ICD-10-CM | POA: Insufficient documentation

## 2022-10-26 DIAGNOSIS — I6782 Cerebral ischemia: Secondary | ICD-10-CM | POA: Insufficient documentation

## 2022-10-26 DIAGNOSIS — C9001 Multiple myeloma in remission: Secondary | ICD-10-CM

## 2022-10-26 DIAGNOSIS — Z87891 Personal history of nicotine dependence: Secondary | ICD-10-CM | POA: Insufficient documentation

## 2022-10-26 DIAGNOSIS — M4714 Other spondylosis with myelopathy, thoracic region: Secondary | ICD-10-CM | POA: Diagnosis not present

## 2022-10-26 DIAGNOSIS — M47816 Spondylosis without myelopathy or radiculopathy, lumbar region: Secondary | ICD-10-CM | POA: Insufficient documentation

## 2022-10-26 DIAGNOSIS — Z9484 Stem cells transplant status: Secondary | ICD-10-CM | POA: Insufficient documentation

## 2022-10-26 DIAGNOSIS — Z7961 Long term (current) use of immunomodulator: Secondary | ICD-10-CM | POA: Diagnosis not present

## 2022-10-26 DIAGNOSIS — M5416 Radiculopathy, lumbar region: Secondary | ICD-10-CM | POA: Diagnosis not present

## 2022-10-27 ENCOUNTER — Other Ambulatory Visit: Payer: Self-pay

## 2022-10-27 ENCOUNTER — Ambulatory Visit
Admission: RE | Admit: 2022-10-27 | Discharge: 2022-10-27 | Disposition: A | Discharge status: Home / Self Care | Payer: Medicare HMO | Source: Ambulatory Visit | Attending: Radiation Oncology | Admitting: Radiation Oncology

## 2022-10-27 DIAGNOSIS — C9002 Multiple myeloma in relapse: Secondary | ICD-10-CM | POA: Insufficient documentation

## 2022-10-27 DIAGNOSIS — Z510 Encounter for antineoplastic radiation therapy: Secondary | ICD-10-CM | POA: Insufficient documentation

## 2022-10-27 DIAGNOSIS — C7951 Secondary malignant neoplasm of bone: Secondary | ICD-10-CM | POA: Insufficient documentation

## 2022-10-27 DIAGNOSIS — C9000 Multiple myeloma not having achieved remission: Secondary | ICD-10-CM | POA: Diagnosis not present

## 2022-11-01 DIAGNOSIS — C9002 Multiple myeloma in relapse: Secondary | ICD-10-CM | POA: Diagnosis not present

## 2022-11-01 DIAGNOSIS — C9000 Multiple myeloma not having achieved remission: Secondary | ICD-10-CM | POA: Diagnosis not present

## 2022-11-01 DIAGNOSIS — C7951 Secondary malignant neoplasm of bone: Secondary | ICD-10-CM | POA: Diagnosis not present

## 2022-11-01 DIAGNOSIS — Z510 Encounter for antineoplastic radiation therapy: Secondary | ICD-10-CM | POA: Diagnosis not present

## 2022-11-03 ENCOUNTER — Ambulatory Visit
Admission: RE | Admit: 2022-11-03 | Discharge: 2022-11-03 | Disposition: A | Discharge status: Home / Self Care | Payer: Medicare HMO | Source: Ambulatory Visit | Attending: Radiation Oncology | Admitting: Radiation Oncology

## 2022-11-03 ENCOUNTER — Other Ambulatory Visit: Payer: Self-pay

## 2022-11-03 DIAGNOSIS — Z510 Encounter for antineoplastic radiation therapy: Secondary | ICD-10-CM | POA: Diagnosis not present

## 2022-11-03 DIAGNOSIS — C9000 Multiple myeloma not having achieved remission: Secondary | ICD-10-CM | POA: Diagnosis not present

## 2022-11-03 DIAGNOSIS — C9002 Multiple myeloma in relapse: Secondary | ICD-10-CM | POA: Diagnosis not present

## 2022-11-03 DIAGNOSIS — C7951 Secondary malignant neoplasm of bone: Secondary | ICD-10-CM | POA: Diagnosis not present

## 2022-11-03 LAB — RAD ONC ARIA SESSION SUMMARY
Course Elapsed Days: 0
Plan Fractions Treated to Date: 1
Plan Fractions Treated to Date: 1
Plan Prescribed Dose Per Fraction: 2.5 Gy
Plan Prescribed Dose Per Fraction: 2.5 Gy
Plan Total Fractions Prescribed: 10
Plan Total Fractions Prescribed: 10
Plan Total Prescribed Dose: 25 Gy
Plan Total Prescribed Dose: 25 Gy
Reference Point Dosage Given to Date: 2.5 Gy
Reference Point Dosage Given to Date: 2.5 Gy
Reference Point Session Dosage Given: 2.5 Gy
Reference Point Session Dosage Given: 2.5 Gy
Session Number: 1

## 2022-11-04 ENCOUNTER — Ambulatory Visit
Admission: RE | Admit: 2022-11-04 | Discharge: 2022-11-04 | Disposition: A | Discharge status: Home / Self Care | Payer: Medicare HMO | Source: Ambulatory Visit | Attending: Radiation Oncology | Admitting: Radiation Oncology

## 2022-11-04 ENCOUNTER — Other Ambulatory Visit: Payer: Self-pay

## 2022-11-04 DIAGNOSIS — C9000 Multiple myeloma not having achieved remission: Secondary | ICD-10-CM | POA: Diagnosis not present

## 2022-11-04 DIAGNOSIS — C9002 Multiple myeloma in relapse: Secondary | ICD-10-CM | POA: Diagnosis not present

## 2022-11-04 DIAGNOSIS — C7951 Secondary malignant neoplasm of bone: Secondary | ICD-10-CM | POA: Diagnosis not present

## 2022-11-04 DIAGNOSIS — Z510 Encounter for antineoplastic radiation therapy: Secondary | ICD-10-CM | POA: Diagnosis not present

## 2022-11-04 LAB — RAD ONC ARIA SESSION SUMMARY
Course Elapsed Days: 1
Plan Fractions Treated to Date: 2
Plan Fractions Treated to Date: 2
Plan Prescribed Dose Per Fraction: 2.5 Gy
Plan Prescribed Dose Per Fraction: 2.5 Gy
Plan Total Fractions Prescribed: 10
Plan Total Fractions Prescribed: 10
Plan Total Prescribed Dose: 25 Gy
Plan Total Prescribed Dose: 25 Gy
Reference Point Dosage Given to Date: 5 Gy
Reference Point Dosage Given to Date: 5 Gy
Reference Point Session Dosage Given: 2.5 Gy
Reference Point Session Dosage Given: 2.5 Gy
Session Number: 2

## 2022-11-07 ENCOUNTER — Other Ambulatory Visit: Payer: Self-pay

## 2022-11-07 ENCOUNTER — Ambulatory Visit: Admission: RE | Admit: 2022-11-07 | Payer: Medicare HMO | Source: Ambulatory Visit

## 2022-11-07 DIAGNOSIS — C9002 Multiple myeloma in relapse: Secondary | ICD-10-CM | POA: Diagnosis not present

## 2022-11-07 DIAGNOSIS — C7951 Secondary malignant neoplasm of bone: Secondary | ICD-10-CM | POA: Diagnosis not present

## 2022-11-07 DIAGNOSIS — C9000 Multiple myeloma not having achieved remission: Secondary | ICD-10-CM | POA: Diagnosis not present

## 2022-11-07 DIAGNOSIS — Z510 Encounter for antineoplastic radiation therapy: Secondary | ICD-10-CM | POA: Diagnosis not present

## 2022-11-07 LAB — RAD ONC ARIA SESSION SUMMARY
Course Elapsed Days: 4
Plan Fractions Treated to Date: 3
Plan Fractions Treated to Date: 3
Plan Prescribed Dose Per Fraction: 2.5 Gy
Plan Prescribed Dose Per Fraction: 2.5 Gy
Plan Total Fractions Prescribed: 10
Plan Total Fractions Prescribed: 10
Plan Total Prescribed Dose: 25 Gy
Plan Total Prescribed Dose: 25 Gy
Reference Point Dosage Given to Date: 7.5 Gy
Reference Point Dosage Given to Date: 7.5 Gy
Reference Point Session Dosage Given: 2.5 Gy
Reference Point Session Dosage Given: 2.5 Gy
Session Number: 3

## 2022-11-08 ENCOUNTER — Other Ambulatory Visit: Payer: Self-pay

## 2022-11-08 ENCOUNTER — Ambulatory Visit
Admission: RE | Admit: 2022-11-08 | Discharge: 2022-11-08 | Disposition: A | Discharge status: Home / Self Care | Payer: Medicare HMO | Source: Ambulatory Visit | Attending: Radiation Oncology | Admitting: Radiation Oncology

## 2022-11-08 ENCOUNTER — Ambulatory Visit: Admission: RE | Admit: 2022-11-08 | Payer: Medicare HMO | Source: Ambulatory Visit

## 2022-11-08 DIAGNOSIS — C9000 Multiple myeloma not having achieved remission: Secondary | ICD-10-CM | POA: Diagnosis not present

## 2022-11-08 DIAGNOSIS — Z510 Encounter for antineoplastic radiation therapy: Secondary | ICD-10-CM | POA: Diagnosis not present

## 2022-11-08 DIAGNOSIS — C7951 Secondary malignant neoplasm of bone: Secondary | ICD-10-CM | POA: Diagnosis not present

## 2022-11-08 DIAGNOSIS — C9002 Multiple myeloma in relapse: Secondary | ICD-10-CM | POA: Diagnosis not present

## 2022-11-08 LAB — RAD ONC ARIA SESSION SUMMARY
Course Elapsed Days: 5
Plan Fractions Treated to Date: 4
Plan Fractions Treated to Date: 4
Plan Prescribed Dose Per Fraction: 2.5 Gy
Plan Prescribed Dose Per Fraction: 2.5 Gy
Plan Total Fractions Prescribed: 10
Plan Total Fractions Prescribed: 10
Plan Total Prescribed Dose: 25 Gy
Plan Total Prescribed Dose: 25 Gy
Reference Point Dosage Given to Date: 10 Gy
Reference Point Dosage Given to Date: 10 Gy
Reference Point Session Dosage Given: 2.5 Gy
Reference Point Session Dosage Given: 2.5 Gy
Session Number: 4

## 2022-11-09 ENCOUNTER — Ambulatory Visit: Admission: RE | Admit: 2022-11-09 | Payer: Medicare HMO | Source: Ambulatory Visit

## 2022-11-09 ENCOUNTER — Other Ambulatory Visit: Payer: Self-pay

## 2022-11-09 DIAGNOSIS — Z510 Encounter for antineoplastic radiation therapy: Secondary | ICD-10-CM | POA: Diagnosis not present

## 2022-11-09 DIAGNOSIS — C7951 Secondary malignant neoplasm of bone: Secondary | ICD-10-CM | POA: Diagnosis not present

## 2022-11-09 DIAGNOSIS — C9000 Multiple myeloma not having achieved remission: Secondary | ICD-10-CM | POA: Diagnosis not present

## 2022-11-09 DIAGNOSIS — C9002 Multiple myeloma in relapse: Secondary | ICD-10-CM | POA: Diagnosis not present

## 2022-11-09 LAB — RAD ONC ARIA SESSION SUMMARY
Course Elapsed Days: 6
Plan Fractions Treated to Date: 5
Plan Fractions Treated to Date: 5
Plan Prescribed Dose Per Fraction: 2.5 Gy
Plan Prescribed Dose Per Fraction: 2.5 Gy
Plan Total Fractions Prescribed: 10
Plan Total Fractions Prescribed: 10
Plan Total Prescribed Dose: 25 Gy
Plan Total Prescribed Dose: 25 Gy
Reference Point Dosage Given to Date: 12.5 Gy
Reference Point Dosage Given to Date: 12.5 Gy
Reference Point Session Dosage Given: 2.5 Gy
Reference Point Session Dosage Given: 2.5 Gy
Session Number: 5

## 2022-11-10 ENCOUNTER — Ambulatory Visit
Admission: RE | Admit: 2022-11-10 | Discharge: 2022-11-10 | Disposition: A | Discharge status: Home / Self Care | Payer: Medicare HMO | Source: Ambulatory Visit | Attending: Radiation Oncology | Admitting: Radiation Oncology

## 2022-11-10 ENCOUNTER — Other Ambulatory Visit: Payer: Self-pay

## 2022-11-10 DIAGNOSIS — C7951 Secondary malignant neoplasm of bone: Secondary | ICD-10-CM | POA: Diagnosis not present

## 2022-11-10 DIAGNOSIS — C9000 Multiple myeloma not having achieved remission: Secondary | ICD-10-CM | POA: Diagnosis not present

## 2022-11-10 DIAGNOSIS — C9002 Multiple myeloma in relapse: Secondary | ICD-10-CM | POA: Diagnosis not present

## 2022-11-10 DIAGNOSIS — Z510 Encounter for antineoplastic radiation therapy: Secondary | ICD-10-CM | POA: Diagnosis not present

## 2022-11-10 LAB — RAD ONC ARIA SESSION SUMMARY
Course Elapsed Days: 7
Plan Fractions Treated to Date: 6
Plan Fractions Treated to Date: 6
Plan Prescribed Dose Per Fraction: 2.5 Gy
Plan Prescribed Dose Per Fraction: 2.5 Gy
Plan Total Fractions Prescribed: 10
Plan Total Fractions Prescribed: 10
Plan Total Prescribed Dose: 25 Gy
Plan Total Prescribed Dose: 25 Gy
Reference Point Dosage Given to Date: 15 Gy
Reference Point Dosage Given to Date: 15 Gy
Reference Point Session Dosage Given: 2.5 Gy
Reference Point Session Dosage Given: 2.5 Gy
Session Number: 6

## 2022-11-11 ENCOUNTER — Ambulatory Visit
Admission: RE | Admit: 2022-11-11 | Discharge: 2022-11-11 | Disposition: A | Discharge status: Home / Self Care | Payer: Medicare HMO | Source: Ambulatory Visit | Attending: Radiation Oncology | Admitting: Radiation Oncology

## 2022-11-11 ENCOUNTER — Other Ambulatory Visit: Payer: Self-pay

## 2022-11-11 DIAGNOSIS — C7951 Secondary malignant neoplasm of bone: Secondary | ICD-10-CM | POA: Diagnosis not present

## 2022-11-11 DIAGNOSIS — Z510 Encounter for antineoplastic radiation therapy: Secondary | ICD-10-CM | POA: Diagnosis not present

## 2022-11-11 DIAGNOSIS — C9002 Multiple myeloma in relapse: Secondary | ICD-10-CM | POA: Diagnosis not present

## 2022-11-11 DIAGNOSIS — C9000 Multiple myeloma not having achieved remission: Secondary | ICD-10-CM | POA: Diagnosis not present

## 2022-11-11 LAB — RAD ONC ARIA SESSION SUMMARY
Course Elapsed Days: 8
Plan Fractions Treated to Date: 7
Plan Fractions Treated to Date: 7
Plan Prescribed Dose Per Fraction: 2.5 Gy
Plan Prescribed Dose Per Fraction: 2.5 Gy
Plan Total Fractions Prescribed: 10
Plan Total Fractions Prescribed: 10
Plan Total Prescribed Dose: 25 Gy
Plan Total Prescribed Dose: 25 Gy
Reference Point Dosage Given to Date: 17.5 Gy
Reference Point Dosage Given to Date: 17.5 Gy
Reference Point Session Dosage Given: 2.5 Gy
Reference Point Session Dosage Given: 2.5 Gy
Session Number: 7

## 2022-11-14 ENCOUNTER — Ambulatory Visit
Admission: RE | Admit: 2022-11-14 | Discharge: 2022-11-14 | Disposition: A | Discharge status: Home / Self Care | Payer: Medicare HMO | Source: Ambulatory Visit | Attending: Radiation Oncology | Admitting: Radiation Oncology

## 2022-11-14 ENCOUNTER — Other Ambulatory Visit: Payer: Self-pay

## 2022-11-14 DIAGNOSIS — C7951 Secondary malignant neoplasm of bone: Secondary | ICD-10-CM | POA: Diagnosis not present

## 2022-11-14 DIAGNOSIS — C9002 Multiple myeloma in relapse: Secondary | ICD-10-CM | POA: Diagnosis not present

## 2022-11-14 DIAGNOSIS — C9000 Multiple myeloma not having achieved remission: Secondary | ICD-10-CM | POA: Diagnosis not present

## 2022-11-14 DIAGNOSIS — Z510 Encounter for antineoplastic radiation therapy: Secondary | ICD-10-CM | POA: Diagnosis not present

## 2022-11-14 LAB — RAD ONC ARIA SESSION SUMMARY
Course Elapsed Days: 11
Plan Fractions Treated to Date: 8
Plan Fractions Treated to Date: 8
Plan Prescribed Dose Per Fraction: 2.5 Gy
Plan Prescribed Dose Per Fraction: 2.5 Gy
Plan Total Fractions Prescribed: 10
Plan Total Fractions Prescribed: 10
Plan Total Prescribed Dose: 25 Gy
Plan Total Prescribed Dose: 25 Gy
Reference Point Dosage Given to Date: 20 Gy
Reference Point Dosage Given to Date: 20 Gy
Reference Point Session Dosage Given: 2.5 Gy
Reference Point Session Dosage Given: 2.5 Gy
Session Number: 8

## 2022-11-15 ENCOUNTER — Ambulatory Visit
Admission: RE | Admit: 2022-11-15 | Discharge: 2022-11-15 | Disposition: A | Discharge status: Home / Self Care | Payer: Medicare HMO | Source: Ambulatory Visit | Attending: Radiation Oncology | Admitting: Radiation Oncology

## 2022-11-15 ENCOUNTER — Ambulatory Visit: Admission: RE | Admit: 2022-11-15 | Payer: Medicare HMO | Source: Ambulatory Visit

## 2022-11-15 ENCOUNTER — Other Ambulatory Visit: Payer: Self-pay

## 2022-11-15 DIAGNOSIS — C9002 Multiple myeloma in relapse: Secondary | ICD-10-CM | POA: Diagnosis not present

## 2022-11-15 DIAGNOSIS — C9000 Multiple myeloma not having achieved remission: Secondary | ICD-10-CM | POA: Diagnosis not present

## 2022-11-15 DIAGNOSIS — C7951 Secondary malignant neoplasm of bone: Secondary | ICD-10-CM | POA: Diagnosis not present

## 2022-11-15 DIAGNOSIS — Z510 Encounter for antineoplastic radiation therapy: Secondary | ICD-10-CM | POA: Diagnosis not present

## 2022-11-15 LAB — RAD ONC ARIA SESSION SUMMARY
Course Elapsed Days: 12
Plan Fractions Treated to Date: 9
Plan Fractions Treated to Date: 9
Plan Prescribed Dose Per Fraction: 2.5 Gy
Plan Prescribed Dose Per Fraction: 2.5 Gy
Plan Total Fractions Prescribed: 10
Plan Total Fractions Prescribed: 10
Plan Total Prescribed Dose: 25 Gy
Plan Total Prescribed Dose: 25 Gy
Reference Point Dosage Given to Date: 22.5 Gy
Reference Point Dosage Given to Date: 22.5 Gy
Reference Point Session Dosage Given: 2.5 Gy
Reference Point Session Dosage Given: 2.5 Gy
Session Number: 9

## 2022-11-16 ENCOUNTER — Ambulatory Visit
Admission: RE | Admit: 2022-11-16 | Discharge: 2022-11-16 | Disposition: A | Discharge status: Home / Self Care | Payer: Medicare HMO | Source: Ambulatory Visit | Attending: Radiation Oncology | Admitting: Radiation Oncology

## 2022-11-16 ENCOUNTER — Other Ambulatory Visit: Payer: Self-pay

## 2022-11-16 DIAGNOSIS — Z510 Encounter for antineoplastic radiation therapy: Secondary | ICD-10-CM | POA: Diagnosis not present

## 2022-11-16 DIAGNOSIS — C9000 Multiple myeloma not having achieved remission: Secondary | ICD-10-CM | POA: Diagnosis not present

## 2022-11-16 DIAGNOSIS — C9002 Multiple myeloma in relapse: Secondary | ICD-10-CM | POA: Diagnosis not present

## 2022-11-16 DIAGNOSIS — C7951 Secondary malignant neoplasm of bone: Secondary | ICD-10-CM | POA: Diagnosis not present

## 2022-11-16 LAB — RAD ONC ARIA SESSION SUMMARY
Course Elapsed Days: 13
Plan Fractions Treated to Date: 10
Plan Fractions Treated to Date: 10
Plan Prescribed Dose Per Fraction: 2.5 Gy
Plan Prescribed Dose Per Fraction: 2.5 Gy
Plan Total Fractions Prescribed: 10
Plan Total Fractions Prescribed: 10
Plan Total Prescribed Dose: 25 Gy
Plan Total Prescribed Dose: 25 Gy
Reference Point Dosage Given to Date: 25 Gy
Reference Point Dosage Given to Date: 25 Gy
Reference Point Session Dosage Given: 2.5 Gy
Reference Point Session Dosage Given: 2.5 Gy
Session Number: 10

## 2022-11-17 ENCOUNTER — Inpatient Hospital Stay: Payer: Medicare HMO | Attending: Hematology

## 2022-11-17 DIAGNOSIS — C9000 Multiple myeloma not having achieved remission: Secondary | ICD-10-CM | POA: Insufficient documentation

## 2022-11-17 LAB — COMPREHENSIVE METABOLIC PANEL
ALT: 14 U/L (ref 0–44)
AST: 14 U/L — ABNORMAL LOW (ref 15–41)
Albumin: 4 g/dL (ref 3.5–5.0)
Alkaline Phosphatase: 50 U/L (ref 38–126)
Anion gap: 10 (ref 5–15)
BUN: 26 mg/dL — ABNORMAL HIGH (ref 8–23)
CO2: 24 mmol/L (ref 22–32)
Calcium: 9.7 mg/dL (ref 8.9–10.3)
Chloride: 100 mmol/L (ref 98–111)
Creatinine, Ser: 0.82 mg/dL (ref 0.44–1.00)
GFR, Estimated: >60 mL/min (ref >60)
Glucose, Bld: 130 mg/dL — ABNORMAL HIGH (ref 70–99)
Potassium: 3.7 mmol/L (ref 3.5–5.1)
Sodium: 134 mmol/L — ABNORMAL LOW (ref 135–145)
Total Bilirubin: 0.8 mg/dL (ref 0.3–1.2)
Total Protein: 8.1 g/dL (ref 6.5–8.1)

## 2022-11-17 LAB — CBC WITH DIFFERENTIAL/PLATELET
Abs Immature Granulocytes: 0.01 10*3/uL (ref 0.00–0.07)
Basophils Absolute: 0 10*3/uL (ref 0.0–0.1)
Basophils Relative: 0 %
Eosinophils Absolute: 0.1 10*3/uL (ref 0.0–0.5)
Eosinophils Relative: 1 %
HCT: 37.8 % (ref 36.0–46.0)
Hemoglobin: 12.5 g/dL (ref 12.0–15.0)
Immature Granulocytes: 0 %
Lymphocytes Relative: 29 %
Lymphs Abs: 1.3 10*3/uL (ref 0.7–4.0)
MCH: 33.9 pg (ref 26.0–34.0)
MCHC: 33.1 g/dL (ref 30.0–36.0)
MCV: 102.4 fL — ABNORMAL HIGH (ref 80.0–100.0)
Monocytes Absolute: 0.5 10*3/uL (ref 0.1–1.0)
Monocytes Relative: 12 %
Neutro Abs: 2.7 10*3/uL (ref 1.7–7.7)
Neutrophils Relative %: 58 %
Platelets: 202 10*3/uL (ref 150–400)
RBC: 3.69 MIL/uL — ABNORMAL LOW (ref 3.87–5.11)
RDW: 13.2 % (ref 11.5–15.5)
WBC: 4.6 10*3/uL (ref 4.0–10.5)
nRBC: 0 % (ref 0.0–0.2)

## 2022-11-17 NOTE — Radiation Completion Notes (Signed)
Patient Name: Jamie Ewing, Jamie Ewing MRN: [id number] Date of Birth: 02-21-1941 Referring Physician: [name], M.D. Date of Service: 2022-11-17 Radiation Oncologist: Arnette Schaumann, M.D. [facility] -                              RADIATION ONCOLOGY END OF TREATMENT NOTE     Diagnosis: C79.51 Secondary malignant neoplasm of bone Intent: Palliative     ==========DELIVERED PLANS==========  First Treatment Date: 2022-11-03 - Last Treatment Date: 2022-11-16   Plan Name: Chest_R Site: Ribs, Right Technique: 3D Mode: Photon Dose Per Fraction: 2.5 Gy Prescribed Dose (Delivered / Prescribed): 25 Gy / 25 Gy Prescribed Fxs (Delivered / Prescribed): 10 / 10   Plan Name: Ext_L Site: Femur Left Technique: Isodose Plan Mode: Photon Dose Per Fraction: 2.5 Gy Prescribed Dose (Delivered / Prescribed): 25 Gy / 25 Gy Prescribed Fxs (Delivered / Prescribed): 10 / 10     ==========ON TREATMENT VISIT DATES========== 2022-11-08, 2022-11-15     ==========UPCOMING VISITS==========       ==========APPENDIX - ON TREATMENT VISIT NOTES==========   See weekly On Treatment Notes in Epic for details.

## 2022-11-18 LAB — KAPPA/LAMBDA LIGHT CHAINS
Kappa free light chain: 23 mg/L — ABNORMAL HIGH (ref 3.3–19.4)
Kappa, lambda light chain ratio: 1.44 (ref 0.26–1.65)
Lambda free light chains: 16 mg/L (ref 5.7–26.3)

## 2022-11-20 LAB — PROTEIN ELECTROPHORESIS, SERUM
A/G Ratio: 1 (ref 0.7–1.7)
Albumin ELP: 4 g/dL (ref 2.9–4.4)
Alpha-1-Globulin: 0.2 g/dL (ref 0.0–0.4)
Alpha-2-Globulin: 0.8 g/dL (ref 0.4–1.0)
Beta Globulin: 1.7 g/dL — ABNORMAL HIGH (ref 0.7–1.3)
Gamma Globulin: 1.2 g/dL (ref 0.4–1.8)
Globulin, Total: 3.9 g/dL (ref 2.2–3.9)
Total Protein ELP: 7.9 g/dL (ref 6.0–8.5)

## 2022-11-21 LAB — IMMUNOFIXATION ELECTROPHORESIS
IgA: 882 mg/dL — ABNORMAL HIGH (ref 64–422)
IgG (Immunoglobin G), Serum: 1267 mg/dL (ref 586–1602)
IgM (Immunoglobulin M), Srm: 19 mg/dL — ABNORMAL LOW (ref 26–217)
Total Protein ELP: 7.8 g/dL (ref 6.0–8.5)

## 2022-11-24 ENCOUNTER — Inpatient Hospital Stay: Payer: Medicare HMO | Attending: Hematology | Admitting: Hematology

## 2022-11-24 DIAGNOSIS — G629 Polyneuropathy, unspecified: Secondary | ICD-10-CM | POA: Diagnosis not present

## 2022-11-24 DIAGNOSIS — C9000 Multiple myeloma not having achieved remission: Secondary | ICD-10-CM | POA: Diagnosis not present

## 2022-11-24 DIAGNOSIS — Z87891 Personal history of nicotine dependence: Secondary | ICD-10-CM | POA: Diagnosis not present

## 2022-11-24 DIAGNOSIS — C9001 Multiple myeloma in remission: Secondary | ICD-10-CM

## 2022-11-24 DIAGNOSIS — Z79899 Other long term (current) drug therapy: Secondary | ICD-10-CM | POA: Diagnosis not present

## 2022-11-24 DIAGNOSIS — Z923 Personal history of irradiation: Secondary | ICD-10-CM | POA: Insufficient documentation

## 2022-11-24 DIAGNOSIS — Z7982 Long term (current) use of aspirin: Secondary | ICD-10-CM | POA: Diagnosis not present

## 2022-11-24 DIAGNOSIS — I10 Essential (primary) hypertension: Secondary | ICD-10-CM | POA: Insufficient documentation

## 2022-11-24 MED ORDER — LENALIDOMIDE 10 MG PO CAPS: 10.0000 mg | ORAL_CAPSULE | Freq: Every day | ORAL | 0 refills | Status: DC

## 2022-11-24 NOTE — Patient Instructions (Signed)
[facility]  Discharge Instructions  You were seen and examined today by Dr. Ellin Saba.  Dr. Ellin Saba discussed your most recent lab work which revealed that everything looks good and stable.  Restart the Revlimid as prescribed. Let Dr. Ellin Saba know if you experience any side effects after you restart the Revlimid.  Follow-up as scheduled in 2 months.    Thank you for choosing [facility] to provide your oncology and hematology care.   To afford each patient quality time with our provider, please arrive at least 15 minutes before your scheduled appointment time. You may need to reschedule your appointment if you arrive late (10 or more minutes). Arriving late affects you and other patients whose appointments are after yours.  Also, if you miss three or more appointments without notifying the office, you may be dismissed from the clinic at the provider's discretion.    Again, thank you for choosing [facility].  Our hope is that these requests will decrease the amount of time that you wait before being seen by our physicians.   If you have a lab appointment with the Cancer Center - please note that after April 8th, all labs will be drawn in the cancer center.  You do not have to check in or register with the main entrance as you have in the past but will complete your check-in at the cancer center.            _____________________________________________________________  Should you have questions after your visit to [facility], please contact our office at [phone] and follow the prompts.  Our office hours are 8:00 a.m. to 4:30 p.m. Monday - Thursday and 8:00 a.m. to 2:30 p.m. Friday.  Please note that voicemails left after 4:00 p.m. may not be returned until the following business day.  We are closed weekends and all major holidays.  You do have access to a nurse 24-7, just call the main number to the  clinic [phone] and do not press any options, hold on the line and a nurse will answer the phone.    For prescription refill requests, have your pharmacy contact our office and allow 72 hours.    Masks are no longer required in the cancer centers. If you would like for your care team to wear a mask while they are taking care of you, please let them know. You may have one support person who is at least 81 years old accompany you for your appointments.

## 2022-11-24 NOTE — Progress Notes (Signed)
[facility] [address] [place] [address]    Clinic Day:  11/24/2022  Referring physician: Carylon Perches, MD  Patient Care Team: Carylon Perches, MD as PCP - General (Internal Medicine) [name], MD as Medical Oncologist (Medical Oncology)   ASSESSMENT & PLAN:   Assessment: 1.  Stage II IgA kappa multiple myeloma, standard risk: -XRT to the spine completed on November 10, 2017 following T9-T10 laminectomy with resection of epidural tumor, T8-T11 posterior fusion by Dr. Jordan Likes. -5 cycles of RVD from 11/14/2017 through 02/19/2018 followed by stem cell transplant on April 03, 2018. -Revlimid maintenance therapy 10 mg 3 weeks on/1 week off started on August 24, 2018. -PET scan on 09/02/2019 showed redemonstrated bilateral cervical, right supraclavicular, right axillary hypermetabolic adenopathy with interval resolution of left axillary adenopathy.  Similar lytic and sclerotic lesions in bones with no corresponding FDG uptake.  Persistent increased FDG uptake in both palatine tonsils. -Reviewed myeloma panel from 09/26/2019.  SPEP and immunofixation is negative.  Free light chain ratio is normal with kappa light chains elevated at 32.5. -Right neck lymph node biopsy on 10/21/2019 at [facility] was negative for malignancy. - PET scan (09/29/2022): Right posterior 10th rib hypermetabolism.  Focal hypermetabolic lesion in the left mid femoral shaft. - XRT to the rib lesion and mid femur lesion, 25 Gray in 10 fractions from 11/03/2022 through 11/16/2022.   2.  Myeloma bone disease: -Zometa was started on 11/21/2017, resumed after transplant on 09/06/2018.    Plan: 1.  Stage II IgA kappa multiple myeloma, standard risk: - She has completed XRT on 11/16/2022. - She reports that she is feeling better.  Energy levels improved.  No pains reported. - Reviewed myeloma panel from 11/17/2022.  SPEP was negative for M spike.  FLC ratio is normal at 1.44.  Immunofixation was  normal. - Labs today shows normal CBC.  LFTs are normal.  Creatinine and calcium are normal. - Recommend restarting Revlimid 10 mg 3 weeks on/1 week off. - Recommend follow-up in 2 months with repeat myeloma labs 1 week prior.   2.  Myeloma bone disease: - Zometa for 3 years completed on 03/10/2022.   3.  Thromboprophylaxis: - Continue aspirin 81 mg daily.   4.  Hypertension: - Continue amlodipine and HCTZ.  Blood pressure today is 130/68.   5.  Peripheral neuropathy: - Continue gabapentin 300 mg twice daily.  Tingling or numbness in the hands and feet is stable.    Orders Placed This Encounter  Procedures   Protein electrophoresis, serum    Standing Status:   Future    Standing Expiration Date:   11/24/2023    Order Specific Question:   Release to patient    Answer:   Immediate   Immunofixation electrophoresis    Standing Status:   Future    Standing Expiration Date:   11/24/2023    Order Specific Question:   Release to patient    Answer:   Immediate   Kappa/lambda light chains    Standing Status:   Future    Standing Expiration Date:   11/24/2023   CBC with Differential/Platelet    Standing Status:   Future    Standing Expiration Date:   11/24/2023    Order Specific Question:   Release to patient    Answer:   Immediate   Comprehensive metabolic panel    Standing Status:   Future    Standing Expiration Date:   11/24/2023  Order Specific Question:   Release to patient    Answer:   Immediate   Ferritin    Standing Status:   Future    Standing Expiration Date:   11/24/2023    Order Specific Question:   Release to patient    Answer:   Immediate   Iron and TIBC    Standing Status:   Future    Standing Expiration Date:   11/24/2023    Order Specific Question:   Release to patient    Answer:   Immediate      I,Helena R Teague,acting as a scribe for [name], MD.,have documented all relevant documentation on the behalf of [name], MD,as directed by   [name], MD while in the presence of [name], MD.  I, [name] MD, have reviewed the above documentation for accuracy and completeness, and I agree with the above.     [name], MD   9/5/20246:43 PM  CHIEF COMPLAINT:   Diagnosis: multiple myeloma    Cancer Staging  No matching staging information was found for the patient.    Prior Therapy: 1. Radiation through 11/10/2017. 2. RVD x 5 cycles from 11/14/2017 to 02/19/2018. 3. Stem cell transplant on 04/03/2018.  Current Therapy:  Maintenance Revlimid 10 mg 3/4 weeks    HISTORY OF PRESENT ILLNESS:   Oncology History  Multiple myeloma (HCC)  10/10/2017 Initial Diagnosis   Multiple myeloma (HCC)   11/14/2017 - 02/26/2018 Chemotherapy   The patient had dexamethasone (DECADRON) tablet 40 mg, 40 mg (100 % of original dose 40 mg), Oral,  Once, 1 of 1 cycle Dose modification: 40 mg (original dose 40 mg, Cycle 1) Administration: 40 mg (11/14/2017) dexamethasone (DECADRON) 4 MG tablet, 1 of 1 cycle, Start date: 11/02/2017, End date: 03/12/2018 lenalidomide (REVLIMID) 25 MG capsule, 1 of 1 cycle, Start date: 02/28/2018, End date: 03/12/2018 bortezomib SQ (VELCADE) chemo injection 2.5 mg, 1.3 mg/m2 = 2.5 mg, Subcutaneous,  Once, 5 of 6 cycles Administration: 2.5 mg (11/14/2017), 2.5 mg (11/21/2017), 2.5 mg (11/28/2017), 2.5 mg (12/14/2017), 2.5 mg (12/21/2017), 2.5 mg (12/28/2017), 2.5 mg (01/04/2018), 2.5 mg (01/11/2018), 2.5 mg (01/18/2018), 2.5 mg (01/25/2018), 2.5 mg (02/01/2018), 2.5 mg (02/08/2018), 2.5 mg (02/19/2018), 2.5 mg (02/26/2018)  for chemotherapy treatment.       INTERVAL HISTORY:   Jamie Ewing is a 82 y.o. female presenting to clinic today for follow up of multiple myeloma. She was last seen by me on 10/11/22.  Today, she states that she is doing well overall. Her appetite level is at 100%. Her energy level is at 80%.  She reports a normal appetite and has gained 6 pounds. Her energy  levels improved after discontinuing Revlimid. Her generalized weakness has improved since her last visit. She still uses a walker for assistance, mainly due to balance issues, and a cane at home for short distances.   She completed 10 rounds of XRT, finished on 11/16/22, and has a follow-up with radiation on 11/26/22.   PAST MEDICAL HISTORY:   Past Medical History: Past Medical History:  Diagnosis Date   Back pain    Hypercholesteremia    Hypertension     Surgical History: Past Surgical History:  Procedure Laterality Date   BONE MARROW BIOPSY     LAMINECTOMY N/A 10/06/2017   Procedure: THORACIC NINE AND TEN LAMINECTOMY WITH RESECTION OF TUMOR, THORACIC EIGHT TO THORACIC ELEVEN FUSION WITH PEDICLE SCREW FIXATION;  Surgeon: Julio Sicks, MD;  Location: [facility] OR;  Service: Neurosurgery;  Laterality:  N/A;   REPLACEMENT TOTAL KNEE Left 2003   TOTAL KNEE ARTHROPLASTY  2001    Social History: Social History   Socioeconomic History   Marital status: Widowed    Spouse name: Not on file   Number of children: 2   Years of education: Not on file   Highest education level: Not on file  Occupational History   Not on file  Tobacco Use   Smoking status: Former    Current packs/day: 0.00    Average packs/day: 0.5 packs/day for 30.0 years (15.0 ttl pk-yrs)    Types: Cigarettes    Start date: 05/30/1976    Quit date: 05/31/2006    Years since quitting: 16.4   Smokeless tobacco: Never  Vaping Use   Vaping status: Never Used  Substance and Sexual Activity   Alcohol use: Yes    Comment: wine occassionally   Drug use: No   Sexual activity: Not on file  Other Topics Concern   Not on file  Social History Narrative   Not on file   Social Determinants of Health   Financial Resource Strain: Low Risk  (10/19/2017)   Overall Financial Resource Strain (CARDIA)    Difficulty of Paying Living Expenses: Not very hard  Food Insecurity: No Food Insecurity (10/26/2022)   Hunger Vital Sign    Worried  About Running Out of Food in the Last Year: Never true    Ran Out of Food in the Last Year: Never true  Transportation Needs: No Transportation Needs (10/26/2022)   PRAPARE - Administrator, Civil Service (Medical): No    Lack of Transportation (Non-Medical): No  Physical Activity: Sufficiently Active (10/19/2017)   Exercise Vital Sign    Days of Exercise per Week: 4 days    Minutes of Exercise per Session: 120 min  Stress: No Stress Concern Present (10/19/2017)   [employer] of Occupational Health - Occupational Stress Questionnaire    Feeling of Stress : Not at all  Social Connections: Moderately Integrated (10/19/2017)   Social Connection and Isolation Panel [NHANES]    Frequency of Communication with Friends and Family: More than three times a week    Frequency of Social Gatherings with Friends and Family: Once a week    Attends Religious Services: More than 4 times per year    Active Member of [employer] or Organizations: Yes    Attends Banker Meetings: More than 4 times per year    Marital Status: Widowed  Intimate Partner Violence: Not At Risk (10/26/2022)   Humiliation, Afraid, Rape, and Kick questionnaire    Fear of Current or Ex-Partner: No    Emotionally Abused: No    Physically Abused: No    Sexually Abused: No    Family History: Family History  Problem Relation Age of Onset   Tuberculosis Mother    Kidney failure Father    Hypertension Paternal Aunt    Diabetes Paternal Aunt    Hypertension Paternal Uncle    Diabetes Paternal Uncle    Hypertension Daughter    Stroke Daughter     Current Medications:  Current Outpatient Medications:    acetaminophen (TYLENOL) 500 MG tablet, Take by mouth., Disp: , Rfl:    amLODipine (NORVASC) 5 MG tablet, Take 5 mg by mouth daily. , Disp: , Rfl:    calcium carbonate (OSCAL) 1500 (600 Ca) MG TABS tablet, Take by mouth., Disp: , Rfl:    gabapentin (NEURONTIN) 300 MG capsule, Take 1 capsule (300 mg  total) by  mouth 2 (two) times daily., Disp: 60 capsule, Rfl: 3   hydrochlorothiazide (HYDRODIURIL) 25 MG tablet, Take 25 mg by mouth daily., Disp: , Rfl:    LORazepam (ATIVAN) 0.5 MG tablet, Take 0.25-0.5 mg by mouth 2 (two) times daily as needed for anxiety., Disp: , Rfl:    LOW-DOSE ASPIRIN PO, Asprin Ec Low Dose, Disp: , Rfl:    Multiple Vitamins-Minerals (THERA-M) TABS, Thera-M 9 mg iron-400 mcg tablet, Disp: , Rfl:    ondansetron (ZOFRAN) 4 MG tablet, , Disp: , Rfl:    polyethylene glycol (MIRALAX / GLYCOLAX) packet, Take 17 g by mouth as needed. , Disp: , Rfl:    potassium chloride SA (KLOR-CON M) 20 MEQ tablet, TAKE 1 TABLET TWICE DAILY, Disp: 180 tablet, Rfl: 3   vitamin B-12 (CYANOCOBALAMIN) 1000 MCG tablet, Vitamin B12, Disp: , Rfl:    lenalidomide (REVLIMID) 10 MG capsule, Take 1 capsule (10 mg total) by mouth daily. Take for 21 days, then hold for 7 days. Repeat every 28 days., Disp: 21 capsule, Rfl: 0   Allergies: No Known Allergies  REVIEW OF SYSTEMS:   Review of Systems  Constitutional:  Negative for chills, fatigue and fever.  HENT:   Negative for lump/mass, mouth sores, nosebleeds, sore throat and trouble swallowing.   Eyes:  Negative for eye problems.  Respiratory:  Negative for cough and shortness of breath.   Cardiovascular:  Negative for chest pain, leg swelling and palpitations.  Gastrointestinal:  Positive for constipation. Negative for abdominal pain, diarrhea, nausea and vomiting.  Genitourinary:  Negative for bladder incontinence, difficulty urinating, dysuria, frequency, hematuria and nocturia.   Musculoskeletal:  Positive for arthralgias (left hip, 6/10 severity). Negative for back pain, flank pain, myalgias and neck pain.  Skin:  Negative for itching and rash.  Neurological:  Negative for dizziness, headaches and numbness.  Hematological:  Does not bruise/bleed easily.  Psychiatric/Behavioral:  Negative for depression, sleep disturbance and suicidal ideas. The patient  is not nervous/anxious.   All other systems reviewed and are negative.    VITALS:   Blood pressure 132/68, pulse 90, temperature 97.7 F (36.5 C), resp. rate 18, height 5' 4.5" (1.638 m), weight 162 lb 3.2 oz (73.6 kg), SpO2 100%.  Wt Readings from Last 3 Encounters:  11/24/22 162 lb 3.2 oz (73.6 kg)  10/26/22 156 lb 8 oz (71 kg)  10/11/22 156 lb 12.8 oz (71.1 kg)    Body mass index is 27.41 kg/m.  Performance status (ECOG): 1 - Symptomatic but completely ambulatory  PHYSICAL EXAM:   Physical Exam Vitals and nursing note reviewed. Exam conducted with a chaperone present.  Constitutional:      Appearance: Normal appearance.  Cardiovascular:     Rate and Rhythm: Normal rate and regular rhythm.     Pulses: Normal pulses.     Heart sounds: Normal heart sounds.  Pulmonary:     Effort: Pulmonary effort is normal.     Breath sounds: Normal breath sounds.  Abdominal:     Palpations: Abdomen is soft. There is no hepatomegaly, splenomegaly or mass.     Tenderness: There is no abdominal tenderness.  Musculoskeletal:     Right lower leg: No edema.     Left lower leg: No edema.  Lymphadenopathy:     Cervical: No cervical adenopathy.     Right cervical: No superficial, deep or posterior cervical adenopathy.    Left cervical: No superficial, deep or posterior cervical adenopathy.     Upper Body:  Right upper body: No supraclavicular or axillary adenopathy.     Left upper body: No supraclavicular or axillary adenopathy.  Neurological:     General: No focal deficit present.     Mental Status: She is alert and oriented to person, place, and time.  Psychiatric:        Mood and Affect: Mood normal.        Behavior: Behavior normal.     LABS:      Latest Ref Rng & Units 11/17/2022    3:00 PM 09/06/2022    2:50 PM 08/14/2022    9:35 AM  CBC  WBC 4.0 - 10.5 K/uL 4.6  3.3  3.0   Hemoglobin 12.0 - 15.0 g/dL 40.9  81.1  91.4   Hematocrit 36.0 - 46.0 % 37.8  36.5  36.7    Platelets 150 - 400 K/uL 202  199  188       Latest Ref Rng & Units 11/17/2022    3:00 PM 09/06/2022    2:50 PM 08/14/2022    9:35 AM  CMP  Glucose 70 - 99 mg/dL 782  956  213   BUN 8 - 23 mg/dL 26  18  15    Creatinine 0.44 - 1.00 mg/dL 0.86  5.78  4.69   Sodium 135 - 145 mmol/L 134  134  134   Potassium 3.5 - 5.1 mmol/L 3.7  3.5  3.7   Chloride 98 - 111 mmol/L 100  100  99   CO2 22 - 32 mmol/L 24  22  19    Calcium 8.9 - 10.3 mg/dL 9.7  9.2  9.3   Total Protein 6.5 - 8.1 g/dL 8.1  7.8  8.0   Total Bilirubin 0.3 - 1.2 mg/dL 0.8  0.6  0.8   Alkaline Phos 38 - 126 U/L 50  43  44   AST 15 - 41 U/L 14  20  20    ALT 0 - 44 U/L 14  21  26       No results found for: "CEA1", "CEA" / No results found for: "CEA1", "CEA" No results found for: "PSA1" No results found for: "CAN199" No results found for: "CAN125"  Lab Results  Component Value Date   TOTALPROTELP 7.8 11/17/2022   TOTALPROTELP 7.9 11/17/2022   ALBUMINELP 4.0 11/17/2022   A1GS 0.2 11/17/2022   A2GS 0.8 11/17/2022   BETS 1.7 (H) 11/17/2022   GAMS 1.2 11/17/2022   MSPIKE Not Observed 11/17/2022   SPEI Comment 11/17/2022   Lab Results  Component Value Date   TIBC 186 (L) 11/14/2017   FERRITIN 455 (H) 11/14/2017   IRONPCTSAT 20 11/14/2017   Lab Results  Component Value Date   LDH 109 03/03/2022   LDH 112 12/09/2021   LDH 100 09/08/2021     STUDIES:   No results found.

## 2022-12-19 ENCOUNTER — Other Ambulatory Visit: Payer: Self-pay | Admitting: Hematology

## 2022-12-19 DIAGNOSIS — C9001 Multiple myeloma in remission: Secondary | ICD-10-CM

## 2022-12-20 ENCOUNTER — Other Ambulatory Visit: Payer: Self-pay

## 2022-12-20 DIAGNOSIS — C9001 Multiple myeloma in remission: Secondary | ICD-10-CM

## 2022-12-20 MED ORDER — LENALIDOMIDE 10 MG PO CAPS: 10.0000 mg | ORAL_CAPSULE | Freq: Every day | ORAL | 0 refills | Status: DC

## 2022-12-20 NOTE — Telephone Encounter (Signed)
Chart reviewed. Revlimid refilled per last office note with Dr. Katragadda.  

## 2022-12-24 NOTE — Progress Notes (Signed)
Radiation Oncology         (336) [phone] ________________________________  Name: Jamie Ewing MRN: [id number]  Date: 12/26/2022  DOB: 1940-11-02  End of Treatment Note  Diagnosis: The primary encounter diagnosis was Kappa light chain myeloma (HCC). Diagnoses of Secondary malignant neoplasm of bone (HCC) and Multiple myeloma in relapse [facility]) were also pertinent to this visit.   Two new sites of active myeloma in the right 10th rib and left femoral shaft    Initially diagnosed with Stage II IgA kappa multiple myeloma in 2019, s/p: T9-T10 laminectomy with resection of an epidural tumor and T8-T11 posterior fusion, adjuvant radiation therapy to the spine completed in August 2019, 5 cycles of RVD completed in December 2019, maintenance Revlimid (since 08/24/2018), and a stem cell transplant on 04/03/2018       Indication for treatment: Palliative        Radiation treatment dates: 11/03/22 through 11/16/22   Site/dose:  1) Right ribs - 25 Gy delivered in 10 Fx at 2.5 Gy/Fx  2) Left femur - 25 Gy delivered in 10 Fx at 2.5 Gy/Fx  Technique/Mode: 3D, Isodose Plan / Photon   Beams/energy: 10X  Narrative: The patient tolerated radiation treatment relatively well without any significant side effects.   Plan: The patient has completed radiation treatment. The patient will return to radiation oncology clinic for routine followup in one month. I advised them to call or return sooner if they have any questions or concerns related to their recovery or treatment.  -----------------------------------  Billie Lade, PhD, MD  This document serves as a record of services personally performed by Antony Blackbird, MD. It was created on his behalf by Neena Rhymes, a trained medical scribe. The creation of this record is based on the scribe's personal observations and the provider's statements to them. This document has been checked and approved by the attending provider.

## 2022-12-25 NOTE — Progress Notes (Signed)
Radiation Oncology         [phone] ________________________________  Name: Jamie Ewing MRN: [id number]  Date: 12/26/2022  DOB: July 04, 1940  Follow-Up Visit Note  CC: Carylon Perches, MD  Carylon Perches, MD  No diagnosis found.  Diagnosis: The primary encounter diagnosis was Kappa light chain myeloma (HCC). Diagnoses of Secondary malignant neoplasm of bone (HCC) and Multiple myeloma in relapse [facility]) were also pertinent to this visit.   Two new sites of active myeloma in the right 10th rib and left femoral shaft  Initially diagnosed with Stage II IgA kappa multiple myeloma in 2019, s/p: T9-T10 laminectomy with resection of an epidural tumor and T8-T11 posterior fusion, adjuvant radiation therapy to the spine completed in August 2019, 5 cycles of RVD completed in December 2019, maintenance Revlimid (since 08/24/2018), and a stem cell transplant on 04/03/2018   Interval Since Last Radiation: 1 month and 9 days   Indication for treatment: Palliative       Radiation treatment dates: 11/03/22 through 11/16/22  Site/dose:  1) Right ribs - 25 Gy delivered in 10 Fx at 2.5 Gy/Fx  2) Left femur - 25 Gy delivered in 10 Fx at 2.5 Gy/Fx Technique/Mode: 3D, Isodose Plan / Photon  Beams/energy: 10X  Narrative:  The patient returns today for routine follow-up. She tolerated radiation treatment relatively well without any significant side effects.     Since completing radiation therapy, the patient followed with Dr. Ellin Saba on 11/24/22. During which time the patient was noted to be doing well overall. Her recent myeloma panel was reviewed at that time which showed normal and Dr. Ellin Saba recommends that she restart Revlimid at 10 mg 3 weeks on and 1 week off.             ***                            Allergies:  has No Known Allergies.  Meds: Current Outpatient Medications  Medication Sig Dispense Refill   acetaminophen (TYLENOL) 500 MG tablet Take by mouth.     amLODipine (NORVASC) 5 MG  tablet Take 5 mg by mouth daily.      calcium carbonate (OSCAL) 1500 (600 Ca) MG TABS tablet Take by mouth.     gabapentin (NEURONTIN) 300 MG capsule Take 1 capsule (300 mg total) by mouth 2 (two) times daily. 60 capsule 3   hydrochlorothiazide (HYDRODIURIL) 25 MG tablet Take 25 mg by mouth daily.     lenalidomide (REVLIMID) 10 MG capsule Take 1 capsule (10 mg total) by mouth daily. Take for 21 days, then hold for 7 days. Repeat every 28 days. 21 capsule 0   LORazepam (ATIVAN) 0.5 MG tablet Take 0.25-0.5 mg by mouth 2 (two) times daily as needed for anxiety.     LOW-DOSE ASPIRIN PO Asprin Ec Low Dose     Multiple Vitamins-Minerals (THERA-M) TABS Thera-M 9 mg iron-400 mcg tablet     ondansetron (ZOFRAN) 4 MG tablet      polyethylene glycol (MIRALAX / GLYCOLAX) packet Take 17 g by mouth as needed.      potassium chloride SA (KLOR-CON M) 20 MEQ tablet TAKE 1 TABLET TWICE DAILY 180 tablet 3   vitamin B-12 (CYANOCOBALAMIN) 1000 MCG tablet Vitamin B12     No current facility-administered medications for this encounter.    Physical Findings: The patient is in no acute distress. Patient is alert and oriented.  vitals were not taken for  this visit. .  No significant changes. Lungs are clear to auscultation bilaterally. Heart has regular rate and rhythm. No palpable cervical, supraclavicular, or axillary adenopathy. Abdomen soft, non-tender, normal bowel sounds.   Lab Findings: Lab Results  Component Value Date   WBC 4.6 11/17/2022   HGB 12.5 11/17/2022   HCT 37.8 11/17/2022   MCV 102.4 (H) 11/17/2022   PLT 202 11/17/2022    Radiographic Findings: No results found.  Impression: The primary encounter diagnosis was Kappa light chain myeloma (HCC). Diagnoses of Secondary malignant neoplasm of bone (HCC) and Multiple myeloma in relapse [facility]) were also pertinent to this visit.   Two new sites of active myeloma in the right 10th rib and left femoral shaft  The patient is recovering from the  effects of radiation.  ***  Plan:  ***   *** minutes of total time was spent for this patient encounter, including preparation, face-to-face counseling with the patient and coordination of care, physical exam, and documentation of the encounter. ____________________________________  Billie Lade, PhD, MD  This document serves as a record of services personally performed by Antony Blackbird, MD. It was created on his behalf by Neena Rhymes, a trained medical scribe. The creation of this record is based on the scribe's personal observations and the provider's statements to them. This document has been checked and approved by the attending provider.

## 2022-12-26 ENCOUNTER — Ambulatory Visit
Admission: RE | Admit: 2022-12-26 | Discharge: 2022-12-26 | Disposition: A | Discharge status: Home / Self Care | Payer: Medicare HMO | Source: Ambulatory Visit | Attending: Radiation Oncology | Admitting: Radiation Oncology

## 2022-12-26 ENCOUNTER — Encounter: Payer: Self-pay | Admitting: Radiation Oncology

## 2022-12-26 VITALS — BP 115/62 | HR 85 | Temp 97.6°F | Resp 18 | Ht 64.6 in | Wt 161.6 lb

## 2022-12-26 DIAGNOSIS — Z9484 Stem cells transplant status: Secondary | ICD-10-CM | POA: Diagnosis not present

## 2022-12-26 DIAGNOSIS — C9002 Multiple myeloma in relapse: Secondary | ICD-10-CM | POA: Insufficient documentation

## 2022-12-26 DIAGNOSIS — Z9221 Personal history of antineoplastic chemotherapy: Secondary | ICD-10-CM | POA: Diagnosis not present

## 2022-12-26 DIAGNOSIS — C9000 Multiple myeloma not having achieved remission: Secondary | ICD-10-CM | POA: Diagnosis present

## 2022-12-26 DIAGNOSIS — Z923 Personal history of irradiation: Secondary | ICD-10-CM | POA: Diagnosis not present

## 2022-12-26 NOTE — Progress Notes (Addendum)
Jamie Ewing is here today for follow up post radiation to the chest and left femur.  Treatment Side: Right chest and left extremities  Does the patient complain of any of the following: Pain: She reports a dull, aching pain in her leg intermittently. Shortness of breath w/wo exertion: Denies Cough: Denies coughing Hemoptysis: Denies Pain with swallowing: Denies Swallowing/choking concerns: Denies Appetite: Good Energy Level: Moderate Post radiation skin Changes: Denies Ambulation : She reports using a walker to get around. Pain or swelling to leg: She reports some swelling when she is up and has shoes on.   BP 115/62 (BP Location: Left Arm, Patient Position: Sitting, Cuff Size: Normal)   Pulse 85   Temp 97.6 F (36.4 C)   Resp 18   Ht 5' 4.6" (1.641 m)   Wt 161 lb 9.6 oz (73.3 kg)   SpO2 99%   BMI 27.23 kg/m

## 2022-12-28 DIAGNOSIS — M1612 Unilateral primary osteoarthritis, left hip: Secondary | ICD-10-CM | POA: Diagnosis not present

## 2022-12-28 DIAGNOSIS — C9000 Multiple myeloma not having achieved remission: Secondary | ICD-10-CM | POA: Diagnosis not present

## 2022-12-28 DIAGNOSIS — M4714 Other spondylosis with myelopathy, thoracic region: Secondary | ICD-10-CM | POA: Diagnosis not present

## 2023-01-04 DIAGNOSIS — M160 Bilateral primary osteoarthritis of hip: Secondary | ICD-10-CM | POA: Diagnosis not present

## 2023-01-15 ENCOUNTER — Other Ambulatory Visit: Payer: Self-pay | Admitting: Hematology

## 2023-01-15 DIAGNOSIS — C9001 Multiple myeloma in remission: Secondary | ICD-10-CM

## 2023-01-16 ENCOUNTER — Encounter (HOSPITAL_COMMUNITY): Payer: Self-pay | Admitting: Hematology

## 2023-01-17 ENCOUNTER — Other Ambulatory Visit: Payer: Self-pay

## 2023-01-17 DIAGNOSIS — C9001 Multiple myeloma in remission: Secondary | ICD-10-CM

## 2023-01-17 MED ORDER — LENALIDOMIDE 10 MG PO CAPS: 10.0000 mg | ORAL_CAPSULE | Freq: Every day | ORAL | 0 refills | Status: DC

## 2023-01-17 NOTE — Telephone Encounter (Signed)
Chart reviewed. Revlimid refilled per last office note with Dr. Katragadda.  

## 2023-01-24 ENCOUNTER — Inpatient Hospital Stay: Payer: Medicare HMO | Attending: Hematology

## 2023-01-24 DIAGNOSIS — C9001 Multiple myeloma in remission: Secondary | ICD-10-CM

## 2023-01-24 DIAGNOSIS — Z79899 Other long term (current) drug therapy: Secondary | ICD-10-CM | POA: Insufficient documentation

## 2023-01-24 DIAGNOSIS — C9000 Multiple myeloma not having achieved remission: Secondary | ICD-10-CM | POA: Insufficient documentation

## 2023-01-24 LAB — COMPREHENSIVE METABOLIC PANEL
ALT: 18 U/L (ref 0–44)
AST: 15 U/L (ref 15–41)
Albumin: 4 g/dL (ref 3.5–5.0)
Alkaline Phosphatase: 55 U/L (ref 38–126)
Anion gap: 12 (ref 5–15)
BUN: 19 mg/dL (ref 8–23)
CO2: 23 mmol/L (ref 22–32)
Calcium: 9.6 mg/dL (ref 8.9–10.3)
Chloride: 101 mmol/L (ref 98–111)
Creatinine, Ser: 0.93 mg/dL (ref 0.44–1.00)
GFR, Estimated: >60 mL/min (ref >60)
Glucose, Bld: 110 mg/dL — ABNORMAL HIGH (ref 70–99)
Potassium: 3.7 mmol/L (ref 3.5–5.1)
Sodium: 136 mmol/L (ref 135–145)
Total Bilirubin: 0.6 mg/dL (ref <1.2)
Total Protein: 8.1 g/dL (ref 6.5–8.1)

## 2023-01-24 LAB — CBC WITH DIFFERENTIAL/PLATELET
Abs Immature Granulocytes: 0.01 10*3/uL (ref 0.00–0.07)
Basophils Absolute: 0 10*3/uL (ref 0.0–0.1)
Basophils Relative: 1 %
Eosinophils Absolute: 0.1 10*3/uL (ref 0.0–0.5)
Eosinophils Relative: 2 %
HCT: 40.3 % (ref 36.0–46.0)
Hemoglobin: 13.4 g/dL (ref 12.0–15.0)
Immature Granulocytes: 0 %
Lymphocytes Relative: 33 %
Lymphs Abs: 1.1 10*3/uL (ref 0.7–4.0)
MCH: 34.2 pg — ABNORMAL HIGH (ref 26.0–34.0)
MCHC: 33.3 g/dL (ref 30.0–36.0)
MCV: 102.8 fL — ABNORMAL HIGH (ref 80.0–100.0)
Monocytes Absolute: 0.4 10*3/uL (ref 0.1–1.0)
Monocytes Relative: 12 %
Neutro Abs: 1.6 10*3/uL — ABNORMAL LOW (ref 1.7–7.7)
Neutrophils Relative %: 52 %
Platelets: 228 10*3/uL (ref 150–400)
RBC: 3.92 MIL/uL (ref 3.87–5.11)
RDW: 13.2 % (ref 11.5–15.5)
WBC: 3.1 10*3/uL — ABNORMAL LOW (ref 4.0–10.5)
nRBC: 0 % (ref 0.0–0.2)

## 2023-01-24 LAB — IRON AND TIBC
Iron: 75 ug/dL (ref 28–170)
Saturation Ratios: 20 % (ref 10.4–31.8)
TIBC: 367 ug/dL (ref 250–450)
UIBC: 292 ug/dL

## 2023-01-24 LAB — FERRITIN: Ferritin: 151 ng/mL (ref 11–307)

## 2023-01-25 LAB — KAPPA/LAMBDA LIGHT CHAINS
Kappa free light chain: 26 mg/L — ABNORMAL HIGH (ref 3.3–19.4)
Kappa, lambda light chain ratio: 1.41 (ref 0.26–1.65)
Lambda free light chains: 18.4 mg/L (ref 5.7–26.3)

## 2023-01-26 LAB — PROTEIN ELECTROPHORESIS, SERUM
A/G Ratio: 1.1 (ref 0.7–1.7)
Albumin ELP: 4.1 g/dL (ref 2.9–4.4)
Alpha-1-Globulin: 0.2 g/dL (ref 0.0–0.4)
Alpha-2-Globulin: 0.9 g/dL (ref 0.4–1.0)
Beta Globulin: 1.5 g/dL — ABNORMAL HIGH (ref 0.7–1.3)
Gamma Globulin: 1.3 g/dL (ref 0.4–1.8)
Globulin, Total: 3.9 g/dL (ref 2.2–3.9)
Total Protein ELP: 8 g/dL (ref 6.0–8.5)

## 2023-01-31 ENCOUNTER — Inpatient Hospital Stay: Payer: Medicare HMO | Admitting: Hematology

## 2023-01-31 VITALS — BP 131/76 | HR 86 | Temp 98.0°F | Resp 16 | Wt 158.0 lb

## 2023-01-31 DIAGNOSIS — C9000 Multiple myeloma not having achieved remission: Secondary | ICD-10-CM

## 2023-01-31 DIAGNOSIS — Z79899 Other long term (current) drug therapy: Secondary | ICD-10-CM | POA: Diagnosis not present

## 2023-01-31 NOTE — Patient Instructions (Signed)
[facility] at [facility] Discharge Instructions   You were seen and examined today by Dr. Ellin Saba.  He reviewed the results of your lab work which are normal/stable.   We will reduce the dose of the Revlimid to 5 mg daily, 3 weeks on and 1 week off.   We will see you back in 2 months. We will repeat lab work prior to this visit.  Return as scheduled.    Thank you for choosing [facility] at [facility] to provide your oncology and hematology care.  To afford each patient quality time with our provider, please arrive at least 15 minutes before your scheduled appointment time.   If you have a lab appointment with the Cancer Center please come in thru the Main Entrance and check in at the main information desk.  You need to re-schedule your appointment should you arrive 10 or more minutes late.  We strive to give you quality time with our providers, and arriving late affects you and other patients whose appointments are after yours.  Also, if you no show three or more times for appointments you may be dismissed from the clinic at the providers discretion.     Again, thank you for choosing [facility].  Our hope is that these requests will decrease the amount of time that you wait before being seen by our physicians.       _____________________________________________________________  Should you have questions after your visit to [facility], please contact our office at [phone] and follow the prompts.  Our office hours are 8:00 a.m. and 4:30 p.m. Monday - Friday.  Please note that voicemails left after 4:00 p.m. may not be returned until the following business day.  We are closed weekends and major holidays.  You do have access to a nurse 24-7, just call the main number to the clinic [phone] and do not press any options, hold on the line and a nurse will answer the phone.    For prescription refill requests,  have your pharmacy contact our office and allow 72 hours.    Due to Covid, you will need to wear a mask upon entering the hospital. If you do not have a mask, a mask will be given to you at the Main Entrance upon arrival. For doctor visits, patients may have 1 support person age 81 or older with them. For treatment visits, patients can not have anyone with them due to social distancing guidelines and our immunocompromised population.

## 2023-01-31 NOTE — Progress Notes (Signed)
[facility] [address], [place] [address]    Clinic Day:  01/31/2023  Referring physician: Carylon Perches, MD  Patient Care Team: Carylon Perches, MD as PCP - General (Internal Medicine) [name], MD as Medical Oncologist (Medical Oncology)   ASSESSMENT & PLAN:   Assessment: 1.  Stage II IgA kappa multiple myeloma, standard risk: -XRT to the spine completed on November 10, 2017 following T9-T10 laminectomy with resection of epidural tumor, T8-T11 posterior fusion by Dr. Jordan Likes. -5 cycles of RVD from 11/14/2017 through 02/19/2018 followed by stem cell transplant on April 03, 2018. -Revlimid maintenance therapy 10 mg 3 weeks on/1 week off started on August 24, 2018. -PET scan on 09/02/2019 showed redemonstrated bilateral cervical, right supraclavicular, right axillary hypermetabolic adenopathy with interval resolution of left axillary adenopathy.  Similar lytic and sclerotic lesions in bones with no corresponding FDG uptake.  Persistent increased FDG uptake in both palatine tonsils. -Reviewed myeloma panel from 09/26/2019.  SPEP and immunofixation is negative.  Free light chain ratio is normal with kappa light chains elevated at 32.5. -Right neck lymph node biopsy on 10/21/2019 at [facility] was negative for malignancy. - PET scan (09/29/2022): Right posterior 10th rib hypermetabolism.  Focal hypermetabolic lesion in the left mid femoral shaft. - XRT to the rib lesion and mid femur lesion, 25 Gray in 10 fractions from 11/03/2022 through 11/16/2022. - Revlimid dose reduced to 5 mg 3 weeks on/1 week off on 01/31/2023 due to decreased tolerance to 10 mg tablet (decrease in energy, appetite and hair thinning)   2.  Myeloma bone disease: -Zometa was started on 11/21/2017, resumed after transplant on 09/06/2018.    Plan: 1.  Stage II IgA kappa multiple myeloma, standard risk: - She was started back on Revlimid 10 mg 3 weeks on/1 week off at last visit on 11/24/2022.  She  took it for about 5-6 weeks and started having severely decreased energy levels and decreased appetite and hair thinning.  She called our office and we told her to stop taking it. - We reviewed myeloma labs from 01/24/2023: M spike is not detected.  FLC ratio is normal at 1.41.  Kappa light chains are stable at 26.  Immunofixation results are pending. - She is having tolerability issues with 10 mg Revlimid dose. - We will decrease Revlimid to 5 mg 3 weeks on/1 week off..  She will start it as soon as she receives the medication.  I will see her back in 2 months for follow-up. - She does report left hip pain from arthritis and has recently received an injection in the joint.   2.  Myeloma bone disease: - Zometa 3 years completed on 03/10/2022.   3.  Thromboprophylaxis: - Continue aspirin 81 mg daily.   4.  Hypertension: - Continue amlodipine and HCTZ.  Blood pressure today is 130/76.   5.  Peripheral neuropathy: - Continue gabapentin 300 mg twice daily.  Tingling and numbness in the hands and feet is stable.    No orders of the defined types were placed in this encounter.     Alben Deeds Teague,acting as a Neurosurgeon for [name], MD.,have documented all relevant documentation on the behalf of [name], MD,as directed by  [name], MD while in the presence of [name], MD.  I, [name] MD, have reviewed the above documentation for accuracy and completeness, and I agree with the above.      [name], MD   11/12/202412:48  PM  CHIEF COMPLAINT:   Diagnosis: multiple myeloma    Cancer Staging  No matching staging information was found for the patient.    Prior Therapy: 1. Radiation through 11/10/2017. 2. RVD x 5 cycles from 11/14/2017 to 02/19/2018. 3. Stem cell transplant on 04/03/2018.  Current Therapy:  Maintenance Revlimid 10 mg 3/4 weeks    HISTORY OF PRESENT ILLNESS:   Oncology History  Multiple myeloma  (HCC)  10/10/2017 Initial Diagnosis   Multiple myeloma (HCC)   11/14/2017 - 02/26/2018 Chemotherapy   The patient had dexamethasone (DECADRON) tablet 40 mg, 40 mg (100 % of original dose 40 mg), Oral,  Once, 1 of 1 cycle Dose modification: 40 mg (original dose 40 mg, Cycle 1) Administration: 40 mg (11/14/2017) dexamethasone (DECADRON) 4 MG tablet, 1 of 1 cycle, Start date: 11/02/2017, End date: 03/12/2018 lenalidomide (REVLIMID) 25 MG capsule, 1 of 1 cycle, Start date: 02/28/2018, End date: 03/12/2018 bortezomib SQ (VELCADE) chemo injection 2.5 mg, 1.3 mg/m2 = 2.5 mg, Subcutaneous,  Once, 5 of 6 cycles Administration: 2.5 mg (11/14/2017), 2.5 mg (11/21/2017), 2.5 mg (11/28/2017), 2.5 mg (12/14/2017), 2.5 mg (12/21/2017), 2.5 mg (12/28/2017), 2.5 mg (01/04/2018), 2.5 mg (01/11/2018), 2.5 mg (01/18/2018), 2.5 mg (01/25/2018), 2.5 mg (02/01/2018), 2.5 mg (02/08/2018), 2.5 mg (02/19/2018), 2.5 mg (02/26/2018)  for chemotherapy treatment.       INTERVAL HISTORY:   Jamie Ewing is a 82 y.o. female presenting to clinic today for follow up of multiple myeloma. She was last seen by me on 11/24/22.  Today, she states that she is doing well overall. Her appetite level is at 80%. Her energy level is at 75%.  She discontinued Revlimid 3 weeks ago due to side effects of decreased energy, decreased appetite, hair thinning, weight loss of 6 pounds over 2 months, and vision issues. After her last visit, she saw a neurosurgeon and was told she had bilateral osteoarthritis with her left hip joint being bone on bone. She received injections to the left hip bone that should relieve pain for 3 months. If injections are ineffective she reportedly will undergo hip replacements. She reports XRT to the hips did not cause any issues.   PAST MEDICAL HISTORY:   Past Medical History: Past Medical History:  Diagnosis Date   Back pain    Hypercholesteremia    Hypertension     Surgical History: Past Surgical History:  Procedure  Laterality Date   BONE MARROW BIOPSY     LAMINECTOMY N/A 10/06/2017   Procedure: THORACIC NINE AND TEN LAMINECTOMY WITH RESECTION OF TUMOR, THORACIC EIGHT TO THORACIC ELEVEN FUSION WITH PEDICLE SCREW FIXATION;  Surgeon: Julio Sicks, MD;  Location: [facility] OR;  Service: Neurosurgery;  Laterality: N/A;   REPLACEMENT TOTAL KNEE Left 2003   TOTAL KNEE ARTHROPLASTY  2001    Social History: Social History   Socioeconomic History   Marital status: Widowed    Spouse name: Not on file   Number of children: 2   Years of education: Not on file   Highest education level: Not on file  Occupational History   Not on file  Tobacco Use   Smoking status: Former    Current packs/day: 0.00    Average packs/day: 0.5 packs/day for 30.0 years (15.0 ttl pk-yrs)    Types: Cigarettes    Start date: 05/30/1976    Quit date: 05/31/2006    Years since quitting: 16.6   Smokeless tobacco: Never  Vaping Use   Vaping status: Never Used  Substance and Sexual Activity  Alcohol use: Yes    Comment: wine occassionally   Drug use: No   Sexual activity: Not on file  Other Topics Concern   Not on file  Social History Narrative   Not on file   Social Determinants of Health   Financial Resource Strain: Low Risk  (10/19/2017)   Overall Financial Resource Strain (CARDIA)    Difficulty of Paying Living Expenses: Not very hard  Food Insecurity: No Food Insecurity (10/26/2022)   Hunger Vital Sign    Worried About Running Out of Food in the Last Year: Never true    Ran Out of Food in the Last Year: Never true  Transportation Needs: No Transportation Needs (10/26/2022)   PRAPARE - Administrator, Civil Service (Medical): No    Lack of Transportation (Non-Medical): No  Physical Activity: Sufficiently Active (10/19/2017)   Exercise Vital Sign    Days of Exercise per Week: 4 days    Minutes of Exercise per Session: 120 min  Stress: No Stress Concern Present (10/19/2017)   [employer] of Occupational Health -  Occupational Stress Questionnaire    Feeling of Stress : Not at all  Social Connections: Moderately Integrated (10/19/2017)   Social Connection and Isolation Panel [NHANES]    Frequency of Communication with Friends and Family: More than three times a week    Frequency of Social Gatherings with Friends and Family: Once a week    Attends Religious Services: More than 4 times per year    Active Member of [employer] or Organizations: Yes    Attends Banker Meetings: More than 4 times per year    Marital Status: Widowed  Intimate Partner Violence: Not At Risk (10/26/2022)   Humiliation, Afraid, Rape, and Kick questionnaire    Fear of Current or Ex-Partner: No    Emotionally Abused: No    Physically Abused: No    Sexually Abused: No    Family History: Family History  Problem Relation Age of Onset   Tuberculosis Mother    Kidney failure Father    Hypertension Paternal Aunt    Diabetes Paternal Aunt    Hypertension Paternal Uncle    Diabetes Paternal Uncle    Hypertension Daughter    Stroke Daughter     Current Medications:  Current Outpatient Medications:    acetaminophen (TYLENOL) 500 MG tablet, Take by mouth., Disp: , Rfl:    amLODipine (NORVASC) 5 MG tablet, Take 5 mg by mouth daily. , Disp: , Rfl:    calcium carbonate (OSCAL) 1500 (600 Ca) MG TABS tablet, Take by mouth., Disp: , Rfl:    gabapentin (NEURONTIN) 300 MG capsule, Take 1 capsule (300 mg total) by mouth 2 (two) times daily., Disp: 60 capsule, Rfl: 3   hydrochlorothiazide (HYDRODIURIL) 25 MG tablet, Take 25 mg by mouth daily., Disp: , Rfl:    LORazepam (ATIVAN) 0.5 MG tablet, Take 0.25-0.5 mg by mouth 2 (two) times daily as needed for anxiety., Disp: , Rfl:    LOW-DOSE ASPIRIN PO, Asprin Ec Low Dose, Disp: , Rfl:    Multiple Vitamins-Minerals (THERA-M) TABS, Thera-M 9 mg iron-400 mcg tablet, Disp: , Rfl:    ondansetron (ZOFRAN) 4 MG tablet, , Disp: , Rfl:    polyethylene glycol (MIRALAX / GLYCOLAX) packet, Take  17 g by mouth as needed. , Disp: , Rfl:    potassium chloride SA (KLOR-CON M) 20 MEQ tablet, TAKE 1 TABLET TWICE DAILY, Disp: 180 tablet, Rfl: 3   vitamin B-12 (CYANOCOBALAMIN) 1000  MCG tablet, Vitamin B12, Disp: , Rfl:    lenalidomide (REVLIMID) 10 MG capsule, Take 1 capsule (10 mg total) by mouth daily. Take for 21 days, then hold for 7 days. Repeat every 28 days. (Patient not taking: Reported on 01/31/2023), Disp: 21 capsule, Rfl: 0   Allergies: No Known Allergies  REVIEW OF SYSTEMS:   Review of Systems  Constitutional:  Negative for chills, fatigue and fever.  HENT:   Negative for lump/mass, mouth sores, nosebleeds, sore throat and trouble swallowing.   Eyes:  Negative for eye problems.  Respiratory:  Negative for cough and shortness of breath.   Cardiovascular:  Negative for chest pain, leg swelling and palpitations.  Gastrointestinal:  Negative for abdominal pain, constipation, diarrhea, nausea and vomiting.  Genitourinary:  Negative for bladder incontinence, difficulty urinating, dysuria, frequency, hematuria and nocturia.   Musculoskeletal:  Negative for arthralgias, back pain, flank pain, myalgias and neck pain.  Skin:  Negative for itching and rash.  Neurological:  Positive for numbness. Negative for dizziness and headaches.  Hematological:  Does not bruise/bleed easily.  Psychiatric/Behavioral:  Negative for depression, sleep disturbance and suicidal ideas. The patient is not nervous/anxious.   All other systems reviewed and are negative.    VITALS:   Blood pressure 131/76, pulse 86, temperature 98 F (36.7 C), temperature source Oral, resp. rate 16, weight 158 lb (71.7 kg), SpO2 100%.  Wt Readings from Last 3 Encounters:  01/31/23 158 lb (71.7 kg)  12/26/22 161 lb 9.6 oz (73.3 kg)  11/24/22 162 lb 3.2 oz (73.6 kg)    Body mass index is 26.62 kg/m.  Performance status (ECOG): 1 - Symptomatic but completely ambulatory  PHYSICAL EXAM:   Physical Exam Vitals and  nursing note reviewed. Exam conducted with a chaperone present.  Constitutional:      Appearance: Normal appearance.  Cardiovascular:     Rate and Rhythm: Normal rate and regular rhythm.     Pulses: Normal pulses.     Heart sounds: Normal heart sounds.  Pulmonary:     Effort: Pulmonary effort is normal.     Breath sounds: Normal breath sounds.  Abdominal:     Palpations: Abdomen is soft. There is no hepatomegaly, splenomegaly or mass.     Tenderness: There is no abdominal tenderness.  Musculoskeletal:     Right lower leg: No edema.     Left lower leg: No edema.  Lymphadenopathy:     Cervical: No cervical adenopathy.     Right cervical: No superficial, deep or posterior cervical adenopathy.    Left cervical: No superficial, deep or posterior cervical adenopathy.     Upper Body:     Right upper body: No supraclavicular or axillary adenopathy.     Left upper body: No supraclavicular or axillary adenopathy.  Neurological:     General: No focal deficit present.     Mental Status: She is alert and oriented to person, place, and time.  Psychiatric:        Mood and Affect: Mood normal.        Behavior: Behavior normal.     LABS:      Latest Ref Rng & Units 01/24/2023    8:13 AM 11/17/2022    3:00 PM 09/06/2022    2:50 PM  CBC  WBC 4.0 - 10.5 K/uL 3.1  4.6  3.3   Hemoglobin 12.0 - 15.0 g/dL 09.8  11.9  14.7   Hematocrit 36.0 - 46.0 % 40.3  37.8  36.5  Platelets 150 - 400 K/uL 228  202  199       Latest Ref Rng & Units 01/24/2023    8:13 AM 11/17/2022    3:00 PM 09/06/2022    2:50 PM  CMP  Glucose 70 - 99 mg/dL 664  403  474   BUN 8 - 23 mg/dL 19  26  18    Creatinine 0.44 - 1.00 mg/dL 2.59  5.63  8.75   Sodium 135 - 145 mmol/L 136  134  134   Potassium 3.5 - 5.1 mmol/L 3.7  3.7  3.5   Chloride 98 - 111 mmol/L 101  100  100   CO2 22 - 32 mmol/L 23  24  22    Calcium 8.9 - 10.3 mg/dL 9.6  9.7  9.2   Total Protein 6.5 - 8.1 g/dL 8.1  8.1  7.8   Total Bilirubin <1.2 mg/dL 0.6   0.8  0.6   Alkaline Phos 38 - 126 U/L 55  50  43   AST 15 - 41 U/L 15  14  20    ALT 0 - 44 U/L 18  14  21       No results found for: "CEA1", "CEA" / No results found for: "CEA1", "CEA" No results found for: "PSA1" No results found for: "CAN199" No results found for: "CAN125"  Lab Results  Component Value Date   TOTALPROTELP 8.0 01/24/2023   ALBUMINELP 4.1 01/24/2023   A1GS 0.2 01/24/2023   A2GS 0.9 01/24/2023   BETS 1.5 (H) 01/24/2023   GAMS 1.3 01/24/2023   MSPIKE Not Observed 01/24/2023   SPEI Comment 01/24/2023   Lab Results  Component Value Date   TIBC 367 01/24/2023   TIBC 186 (L) 11/14/2017   FERRITIN 151 01/24/2023   FERRITIN 455 (H) 11/14/2017   IRONPCTSAT 20 01/24/2023   IRONPCTSAT 20 11/14/2017   Lab Results  Component Value Date   LDH 109 03/03/2022   LDH 112 12/09/2021   LDH 100 09/08/2021     STUDIES:   No results found.

## 2023-02-01 LAB — IMMUNOFIXATION ELECTROPHORESIS
IgA: 1050 mg/dL — ABNORMAL HIGH (ref 64–422)
IgG (Immunoglobin G), Serum: 1289 mg/dL (ref 586–1602)
IgM (Immunoglobulin M), Srm: 16 mg/dL — ABNORMAL LOW (ref 26–217)
Total Protein ELP: 7.8 g/dL (ref 6.0–8.5)

## 2023-02-08 ENCOUNTER — Other Ambulatory Visit: Payer: Self-pay

## 2023-02-08 DIAGNOSIS — C9001 Multiple myeloma in remission: Secondary | ICD-10-CM

## 2023-02-08 MED ORDER — LENALIDOMIDE 5 MG PO CAPS: 5.0000 mg | ORAL_CAPSULE | Freq: Every day | ORAL | 0 refills | Status: DC

## 2023-02-08 NOTE — Progress Notes (Signed)
Chart reviewed. Revlimid refilled per last office note with Dr. Katragadda.  

## 2023-02-22 DIAGNOSIS — N959 Unspecified menopausal and perimenopausal disorder: Secondary | ICD-10-CM | POA: Diagnosis not present

## 2023-02-22 DIAGNOSIS — Z6828 Body mass index (BMI) 28.0-28.9, adult: Secondary | ICD-10-CM | POA: Diagnosis not present

## 2023-02-22 DIAGNOSIS — Z1231 Encounter for screening mammogram for malignant neoplasm of breast: Secondary | ICD-10-CM | POA: Diagnosis not present

## 2023-02-22 DIAGNOSIS — Z01419 Encounter for gynecological examination (general) (routine) without abnormal findings: Secondary | ICD-10-CM | POA: Diagnosis not present

## 2023-02-23 ENCOUNTER — Other Ambulatory Visit: Payer: Self-pay | Admitting: Hematology

## 2023-03-01 ENCOUNTER — Other Ambulatory Visit: Payer: Self-pay | Admitting: Hematology

## 2023-03-01 DIAGNOSIS — C9001 Multiple myeloma in remission: Secondary | ICD-10-CM

## 2023-03-02 ENCOUNTER — Other Ambulatory Visit: Payer: Self-pay

## 2023-03-02 DIAGNOSIS — C9001 Multiple myeloma in remission: Secondary | ICD-10-CM

## 2023-03-02 MED ORDER — LENALIDOMIDE 5 MG PO CAPS: 5.0000 mg | ORAL_CAPSULE | Freq: Every day | ORAL | 0 refills | Status: DC

## 2023-03-02 NOTE — Telephone Encounter (Signed)
Chart reviewed. Revlimid refilled per last office note with Dr. Katragadda.  

## 2023-03-26 ENCOUNTER — Telehealth: Payer: Self-pay

## 2023-03-26 NOTE — Telephone Encounter (Signed)
 Oral Oncology Patient Advocate Encounter  Was successful in securing patient a $12,000.00 grant from [facility] to provide copayment coverage for Lenalidomide .  This will keep the out of pocket expense at $0.     [employer] ID: [id number]   The billing information is as follows and has been shared with [facility].    RxBin: [id number] PCN: [id number] Member ID: [id number] Group ID: [id number] Dates of Eligibility: 01/16/23 through 01/15/24  Fund:  Multiple Myeloma - Medicare Access   Morene Potters, CPhT Oncology Pharmacy Patient Advocate  [facility]  [phone] (phone) [phone] (fax)

## 2023-03-27 ENCOUNTER — Other Ambulatory Visit: Payer: Self-pay | Admitting: Hematology

## 2023-03-27 DIAGNOSIS — C9001 Multiple myeloma in remission: Secondary | ICD-10-CM

## 2023-03-28 ENCOUNTER — Other Ambulatory Visit: Payer: Self-pay | Admitting: Hematology

## 2023-03-28 DIAGNOSIS — C9001 Multiple myeloma in remission: Secondary | ICD-10-CM

## 2023-03-28 NOTE — Telephone Encounter (Signed)
 Chart reviewed. Revlimid refilled per last office note with Dr. Ellin Saba.

## 2023-04-03 ENCOUNTER — Other Ambulatory Visit: Payer: Self-pay

## 2023-04-03 ENCOUNTER — Telehealth: Payer: Self-pay | Admitting: *Deleted

## 2023-04-03 DIAGNOSIS — C9001 Multiple myeloma in remission: Secondary | ICD-10-CM

## 2023-04-03 NOTE — Telephone Encounter (Signed)
 Patient called stating that she is still having visual disturbances, despite decreasing dose of Revlimid .  She describes blurred vision and profound light sensitivity.  She is established with Dr. Darroll in [place], however cannot get an appt until July.  Per Dr. Katragadda, will refer to another OD for exam ASAP.  Patient aware.

## 2023-04-04 ENCOUNTER — Inpatient Hospital Stay: Payer: Medicare HMO | Attending: Hematology

## 2023-04-04 DIAGNOSIS — G629 Polyneuropathy, unspecified: Secondary | ICD-10-CM | POA: Insufficient documentation

## 2023-04-04 DIAGNOSIS — C9000 Multiple myeloma not having achieved remission: Secondary | ICD-10-CM | POA: Diagnosis not present

## 2023-04-04 DIAGNOSIS — Z7982 Long term (current) use of aspirin: Secondary | ICD-10-CM | POA: Diagnosis not present

## 2023-04-04 DIAGNOSIS — I10 Essential (primary) hypertension: Secondary | ICD-10-CM | POA: Diagnosis not present

## 2023-04-04 DIAGNOSIS — C9001 Multiple myeloma in remission: Secondary | ICD-10-CM

## 2023-04-04 DIAGNOSIS — Z79899 Other long term (current) drug therapy: Secondary | ICD-10-CM | POA: Diagnosis not present

## 2023-04-04 DIAGNOSIS — Z87891 Personal history of nicotine dependence: Secondary | ICD-10-CM | POA: Insufficient documentation

## 2023-04-04 LAB — CBC WITH DIFFERENTIAL/PLATELET
Abs Immature Granulocytes: 0.01 10*3/uL (ref 0.00–0.07)
Basophils Absolute: 0 10*3/uL (ref 0.0–0.1)
Basophils Relative: 1 %
Eosinophils Absolute: 0 10*3/uL (ref 0.0–0.5)
Eosinophils Relative: 1 %
HCT: 39.3 % (ref 36.0–46.0)
Hemoglobin: 12.7 g/dL (ref 12.0–15.0)
Immature Granulocytes: 0 %
Lymphocytes Relative: 28 %
Lymphs Abs: 0.9 10*3/uL (ref 0.7–4.0)
MCH: 33.2 pg (ref 26.0–34.0)
MCHC: 32.3 g/dL (ref 30.0–36.0)
MCV: 102.6 fL — ABNORMAL HIGH (ref 80.0–100.0)
Monocytes Absolute: 0.4 10*3/uL (ref 0.1–1.0)
Monocytes Relative: 12 %
Neutro Abs: 1.8 10*3/uL (ref 1.7–7.7)
Neutrophils Relative %: 58 %
Platelets: 219 10*3/uL (ref 150–400)
RBC: 3.83 MIL/uL — ABNORMAL LOW (ref 3.87–5.11)
RDW: 13.6 % (ref 11.5–15.5)
WBC: 3.1 10*3/uL — ABNORMAL LOW (ref 4.0–10.5)
nRBC: 0 % (ref 0.0–0.2)

## 2023-04-04 LAB — COMPREHENSIVE METABOLIC PANEL
ALT: 21 U/L (ref 0–44)
AST: 21 U/L (ref 15–41)
Albumin: 3.9 g/dL (ref 3.5–5.0)
Alkaline Phosphatase: 45 U/L (ref 38–126)
Anion gap: 11 (ref 5–15)
BUN: 17 mg/dL (ref 8–23)
CO2: 23 mmol/L (ref 22–32)
Calcium: 9.7 mg/dL (ref 8.9–10.3)
Chloride: 100 mmol/L (ref 98–111)
Creatinine, Ser: 0.82 mg/dL (ref 0.44–1.00)
GFR, Estimated: >60 mL/min (ref >60)
Glucose, Bld: 130 mg/dL — ABNORMAL HIGH (ref 70–99)
Potassium: 3.9 mmol/L (ref 3.5–5.1)
Sodium: 134 mmol/L — ABNORMAL LOW (ref 135–145)
Total Bilirubin: 0.6 mg/dL (ref 0.0–1.2)
Total Protein: 7.9 g/dL (ref 6.5–8.1)

## 2023-04-05 LAB — KAPPA/LAMBDA LIGHT CHAINS
Kappa free light chain: 33.3 mg/L — ABNORMAL HIGH (ref 3.3–19.4)
Kappa, lambda light chain ratio: 1.44 (ref 0.26–1.65)
Lambda free light chains: 23.2 mg/L (ref 5.7–26.3)

## 2023-04-07 DIAGNOSIS — H5213 Myopia, bilateral: Secondary | ICD-10-CM | POA: Diagnosis not present

## 2023-04-09 LAB — IMMUNOFIXATION ELECTROPHORESIS
IgA: 1035 mg/dL — ABNORMAL HIGH (ref 64–422)
IgG (Immunoglobin G), Serum: 1148 mg/dL (ref 586–1602)
IgM (Immunoglobulin M), Srm: 16 mg/dL — ABNORMAL LOW (ref 26–217)
Total Protein ELP: 7.5 g/dL (ref 6.0–8.5)

## 2023-04-10 ENCOUNTER — Other Ambulatory Visit: Payer: Self-pay | Admitting: Hematology

## 2023-04-10 DIAGNOSIS — E876 Hypokalemia: Secondary | ICD-10-CM

## 2023-04-10 LAB — PROTEIN ELECTROPHORESIS, SERUM
A/G Ratio: 1.1 (ref 0.7–1.7)
Albumin ELP: 4 g/dL (ref 2.9–4.4)
Alpha-1-Globulin: 0.2 g/dL (ref 0.0–0.4)
Alpha-2-Globulin: 0.8 g/dL (ref 0.4–1.0)
Beta Globulin: 1.7 g/dL — ABNORMAL HIGH (ref 0.7–1.3)
Gamma Globulin: 1.1 g/dL (ref 0.4–1.8)
Globulin, Total: 3.8 g/dL (ref 2.2–3.9)
Total Protein ELP: 7.8 g/dL (ref 6.0–8.5)

## 2023-04-10 NOTE — Progress Notes (Signed)
[facility] [address] [place] [address]    Clinic Day:  04/11/2023  Referring physician: Carylon Perches, MD  Patient Care Team: Carylon Perches, MD as PCP - General (Internal Medicine) [name], MD as Medical Oncologist (Medical Oncology)   ASSESSMENT & PLAN:   Assessment: 1.  Stage II IgA kappa multiple myeloma, standard risk: -XRT to the spine completed on November 10, 2017 following T9-T10 laminectomy with resection of epidural tumor, T8-T11 posterior fusion by Dr. Jordan Likes. -5 cycles of RVD from 11/14/2017 through 02/19/2018 followed by stem cell transplant on April 03, 2018. -Revlimid maintenance therapy 10 mg 3 weeks on/1 week off started on August 24, 2018. -PET scan on 09/02/2019 showed redemonstrated bilateral cervical, right supraclavicular, right axillary hypermetabolic adenopathy with interval resolution of left axillary adenopathy.  Similar lytic and sclerotic lesions in bones with no corresponding FDG uptake.  Persistent increased FDG uptake in both palatine tonsils. -Reviewed myeloma panel from 09/26/2019.  SPEP and immunofixation is negative.  Free light chain ratio is normal with kappa light chains elevated at 32.5. -Right neck lymph node biopsy on 10/21/2019 at [facility] was negative for malignancy. - PET scan (09/29/2022): Right posterior 10th rib hypermetabolism.  Focal hypermetabolic lesion in the left mid femoral shaft. - XRT to the rib lesion and mid femur lesion, 25 Gray in 10 fractions from 11/03/2022 through 11/16/2022. - Revlimid dose reduced to 5 mg 3 weeks on/1 week off on 01/31/2023 due to decreased tolerance to 10 mg tablet (decrease in energy, appetite and hair thinning)   2.  Myeloma bone disease: -Zometa was started on 11/21/2017, resumed after transplant on 09/06/2018.    Plan: 1.  Stage II IgA kappa multiple myeloma, standard risk: - Revlimid dose reduced on 01/31/2023. - She reported improvement in itching of the scalp and  energy levels slightly improved. - She continues to have right hip pain as the injection she has received lasted only 1 month. - She has some constipation.  She was told to use MiraLAX daily.  She was evaluated by ophthalmology for blurring vision and new glasses were prescribed. - Reviewed labs from 04/04/2023: Normal LFTs and creatinine.  M spike is negative.  FLC ratio is normal at 1.44.  Kappa light chains are 33, up from 26.  Immunofixation was negative. - Continue Revlimid 5 mg daily 3 weeks on/1 week off.  RTC 12 weeks for follow-up with repeat myeloma labs.  Will check ferritin, iron panel and TSH as she complains of tiredness.   2.  Myeloma bone disease: - Zometa 3 years was completed on 03/10/2022.  Will resume at the time of relapse.   3.  Thromboprophylaxis: - Continue aspirin 81 mg daily for thromboprophylaxis.   4.  Hypertension: - Continue amlodipine and HCTZ.  Blood pressure is normal today.   5.  Peripheral neuropathy: - Tingling and numbness in the hands and feet is stable.  Continue gabapentin 30 mg twice daily.    Orders Placed This Encounter  Procedures   CBC with Differential    Standing Status:   Future    Expected Date:   07/03/2023    Expiration Date:   04/10/2024   Comprehensive metabolic panel    Standing Status:   Future    Expected Date:   07/03/2023    Expiration Date:   04/10/2024   Kappa/lambda light chains    Standing Status:   Future    Expected Date:   07/03/2023  Expiration Date:   04/10/2024   Immunofixation electrophoresis    Standing Status:   Future    Expected Date:   07/03/2023    Expiration Date:   04/10/2024   Protein electrophoresis, serum    Standing Status:   Future    Expected Date:   07/03/2023    Expiration Date:   04/10/2024   Iron and TIBC ([facility] ONLY)    Standing Status:   Future    Expected Date:   07/03/2023    Expiration Date:   04/10/2024   Ferritin    Standing Status:   Future    Expected Date:    07/03/2023    Expiration Date:   04/10/2024   TSH    Standing Status:   Future    Expected Date:   07/03/2023    Expiration Date:   04/10/2024      Mikeal Hawthorne R Teague,acting as a scribe for [name], MD.,have documented all relevant documentation on the behalf of [name], MD,as directed by  [name], MD while in the presence of [name], MD.  I, [name] MD, have reviewed the above documentation for accuracy and completeness, and I agree with the above.     [name], MD   1/21/202512:55 PM  CHIEF COMPLAINT:   Diagnosis: multiple myeloma    Cancer Staging  No matching staging information was found for the patient.    Prior Therapy: 1. Radiation through 11/10/2017. 2. RVD x 5 cycles from 11/14/2017 to 02/19/2018. 3. Stem cell transplant on 04/03/2018.  Current Therapy:  Maintenance Revlimid 10 mg 3/4 weeks    HISTORY OF PRESENT ILLNESS:   Oncology History  Multiple myeloma (HCC)  10/10/2017 Initial Diagnosis   Multiple myeloma (HCC)   11/14/2017 - 02/26/2018 Chemotherapy   The patient had dexamethasone (DECADRON) tablet 40 mg, 40 mg (100 % of original dose 40 mg), Oral,  Once, 1 of 1 cycle Dose modification: 40 mg (original dose 40 mg, Cycle 1) Administration: 40 mg (11/14/2017) dexamethasone (DECADRON) 4 MG tablet, 1 of 1 cycle, Start date: 11/02/2017, End date: 03/12/2018 lenalidomide (REVLIMID) 25 MG capsule, 1 of 1 cycle, Start date: 02/28/2018, End date: 03/12/2018 bortezomib SQ (VELCADE) chemo injection 2.5 mg, 1.3 mg/m2 = 2.5 mg, Subcutaneous,  Once, 5 of 6 cycles Administration: 2.5 mg (11/14/2017), 2.5 mg (11/21/2017), 2.5 mg (11/28/2017), 2.5 mg (12/14/2017), 2.5 mg (12/21/2017), 2.5 mg (12/28/2017), 2.5 mg (01/04/2018), 2.5 mg (01/11/2018), 2.5 mg (01/18/2018), 2.5 mg (01/25/2018), 2.5 mg (02/01/2018), 2.5 mg (02/08/2018), 2.5 mg (02/19/2018), 2.5 mg (02/26/2018)  for chemotherapy treatment.       INTERVAL  HISTORY:   Jamie Ewing is a 83 y.o. female presenting to clinic today for follow up of multiple myeloma. She was last seen by me on 01/31/23.  Today, she states that she is doing well overall. Her appetite level is at 80%. Her energy level is at 50%. She is accompanied by her daughter.  She states since reducing Revlimid dosage her energy and appetite levels have slightly improved, as has her hair itching. She reports left hip pain and states she had an injection in the left hip that improved pain for around 3 months. She notes constipation and says Miralax is not effective in treating symptoms, though she does not take it daily. She reports her vision is still blurry. She went to ophthalmology and had a new prescription for her glasses and eye drops for dry eyes. She did not have any damage to the  eye. She is taking gabapentin, blood pressure medications, and baby ASA as prescribed.  PAST MEDICAL HISTORY:   Past Medical History: Past Medical History:  Diagnosis Date   Back pain    Hypercholesteremia    Hypertension     Surgical History: Past Surgical History:  Procedure Laterality Date   BONE MARROW BIOPSY     LAMINECTOMY N/A 10/06/2017   Procedure: THORACIC NINE AND TEN LAMINECTOMY WITH RESECTION OF TUMOR, THORACIC EIGHT TO THORACIC ELEVEN FUSION WITH PEDICLE SCREW FIXATION;  Surgeon: Julio Sicks, MD;  Location: [facility] OR;  Service: Neurosurgery;  Laterality: N/A;   REPLACEMENT TOTAL KNEE Left 2003   TOTAL KNEE ARTHROPLASTY  2001    Social History: Social History   Socioeconomic History   Marital status: Widowed    Spouse name: Not on file   Number of children: 2   Years of education: Not on file   Highest education level: Not on file  Occupational History   Not on file  Tobacco Use   Smoking status: Former    Current packs/day: 0.00    Average packs/day: 0.5 packs/day for 30.0 years (15.0 ttl pk-yrs)    Types: Cigarettes    Start date: 05/30/1976    Quit date: 05/31/2006    Years  since quitting: 16.8   Smokeless tobacco: Never  Vaping Use   Vaping status: Never Used  Substance and Sexual Activity   Alcohol use: Yes    Comment: wine occassionally   Drug use: No   Sexual activity: Not on file  Other Topics Concern   Not on file  Social History Narrative   Not on file   Social Drivers of Health   Financial Resource Strain: Low Risk  (10/19/2017)   Overall Financial Resource Strain (CARDIA)    Difficulty of Paying Living Expenses: Not very hard  Food Insecurity: No Food Insecurity (10/26/2022)   Hunger Vital Sign    Worried About Running Out of Food in the Last Year: Never true    Ran Out of Food in the Last Year: Never true  Transportation Needs: No Transportation Needs (10/26/2022)   PRAPARE - Administrator, Civil Service (Medical): No    Lack of Transportation (Non-Medical): No  Physical Activity: Sufficiently Active (10/19/2017)   Exercise Vital Sign    Days of Exercise per Week: 4 days    Minutes of Exercise per Session: 120 min  Stress: No Stress Concern Present (10/19/2017)   [employer] of Occupational Health - Occupational Stress Questionnaire    Feeling of Stress : Not at all  Social Connections: Moderately Integrated (10/19/2017)   Social Connection and Isolation Panel [NHANES]    Frequency of Communication with Friends and Family: More than three times a week    Frequency of Social Gatherings with Friends and Family: Once a week    Attends Religious Services: More than 4 times per year    Active Member of [employer] or Organizations: Yes    Attends Banker Meetings: More than 4 times per year    Marital Status: Widowed  Intimate Partner Violence: Not At Risk (10/26/2022)   Humiliation, Afraid, Rape, and Kick questionnaire    Fear of Current or Ex-Partner: No    Emotionally Abused: No    Physically Abused: No    Sexually Abused: No    Family History: Family History  Problem Relation Age of Onset   Tuberculosis Mother     Kidney failure Father    Hypertension Paternal  Aunt    Diabetes Paternal Aunt    Hypertension Paternal Uncle    Diabetes Paternal Uncle    Hypertension Daughter    Stroke Daughter     Current Medications:  Current Outpatient Medications:    acetaminophen (TYLENOL) 500 MG tablet, Take by mouth., Disp: , Rfl:    amLODipine (NORVASC) 5 MG tablet, Take 5 mg by mouth daily. , Disp: , Rfl:    calcium carbonate (OSCAL) 1500 (600 Ca) MG TABS tablet, Take by mouth., Disp: , Rfl:    gabapentin (NEURONTIN) 300 MG capsule, TAKE 1 CAPSULE BY MOUTH TWICE A DAY, Disp: 60 capsule, Rfl: 3   hydrochlorothiazide (HYDRODIURIL) 25 MG tablet, Take 25 mg by mouth daily., Disp: , Rfl:    lenalidomide (REVLIMID) 5 MG capsule, TAKE 1 CAPSULE BY MOUTH DAILY FOR 21 DAYS, HOLD FOR 7 DAYS, REPEAT EVERY 28 DAYS, Disp: 21 capsule, Rfl: 0   LORazepam (ATIVAN) 0.5 MG tablet, Take 0.25-0.5 mg by mouth 2 (two) times daily as needed for anxiety., Disp: , Rfl:    LOW-DOSE ASPIRIN PO, Asprin Ec Low Dose, Disp: , Rfl:    Multiple Vitamins-Minerals (THERA-M) TABS, Thera-M 9 mg iron-400 mcg tablet, Disp: , Rfl:    polyethylene glycol (MIRALAX / GLYCOLAX) packet, Take 17 g by mouth as needed. , Disp: , Rfl:    potassium chloride SA (KLOR-CON M) 20 MEQ tablet, TAKE 1 TABLET TWICE DAILY, Disp: 180 tablet, Rfl: 3   vitamin B-12 (CYANOCOBALAMIN) 1000 MCG tablet, Vitamin B12, Disp: , Rfl:    Allergies: No Known Allergies  REVIEW OF SYSTEMS:   Review of Systems  Constitutional:  Negative for chills, fatigue and fever.  HENT:   Negative for lump/mass, mouth sores, nosebleeds, sore throat and trouble swallowing.   Eyes:  Negative for eye problems.  Respiratory:  Negative for cough and shortness of breath.   Cardiovascular:  Negative for chest pain, leg swelling and palpitations.  Gastrointestinal:  Positive for constipation. Negative for abdominal pain, diarrhea, nausea and vomiting.  Genitourinary:  Negative for bladder  incontinence, difficulty urinating, dysuria, frequency, hematuria and nocturia.   Musculoskeletal:  Positive for arthralgias (in left hip, 6/10 severity). Negative for back pain, flank pain, myalgias and neck pain.  Skin:  Negative for itching and rash.  Neurological:  Negative for dizziness, headaches and numbness.  Hematological:  Does not bruise/bleed easily.  Psychiatric/Behavioral:  Negative for depression, sleep disturbance and suicidal ideas. The patient is not nervous/anxious.   All other systems reviewed and are negative.    VITALS:   Blood pressure 115/65, pulse 91, temperature 98.1 F (36.7 C), temperature source Oral, resp. rate 18, height 5' 4.5" (1.638 m), weight 159 lb (72.1 kg), SpO2 100%.  Wt Readings from Last 3 Encounters:  04/11/23 159 lb (72.1 kg)  01/31/23 158 lb (71.7 kg)  12/26/22 161 lb 9.6 oz (73.3 kg)    Body mass index is 26.87 kg/m.  Performance status (ECOG): 1 - Symptomatic but completely ambulatory  PHYSICAL EXAM:   Physical Exam Vitals and nursing note reviewed. Exam conducted with a chaperone present.  Constitutional:      Appearance: Normal appearance.  Cardiovascular:     Rate and Rhythm: Normal rate and regular rhythm.     Pulses: Normal pulses.     Heart sounds: Normal heart sounds.  Pulmonary:     Effort: Pulmonary effort is normal.     Breath sounds: Normal breath sounds.  Abdominal:     Palpations: Abdomen is soft.  There is no hepatomegaly, splenomegaly or mass.     Tenderness: There is no abdominal tenderness.  Musculoskeletal:     Right lower leg: No edema.     Left lower leg: No edema.  Lymphadenopathy:     Cervical: No cervical adenopathy.     Right cervical: No superficial, deep or posterior cervical adenopathy.    Left cervical: No superficial, deep or posterior cervical adenopathy.     Upper Body:     Right upper body: No supraclavicular or axillary adenopathy.     Left upper body: No supraclavicular or axillary  adenopathy.  Neurological:     General: No focal deficit present.     Mental Status: She is alert and oriented to person, place, and time.  Psychiatric:        Mood and Affect: Mood normal.        Behavior: Behavior normal.     LABS:      Latest Ref Rng & Units 04/04/2023   10:50 AM 01/24/2023    8:13 AM 11/17/2022    3:00 PM  CBC  WBC 4.0 - 10.5 K/uL 3.1  3.1  4.6   Hemoglobin 12.0 - 15.0 g/dL 16.1  09.6  04.5   Hematocrit 36.0 - 46.0 % 39.3  40.3  37.8   Platelets 150 - 400 K/uL 219  228  202       Latest Ref Rng & Units 04/04/2023   10:50 AM 01/24/2023    8:13 AM 11/17/2022    3:00 PM  CMP  Glucose 70 - 99 mg/dL 409  811  914   BUN 8 - 23 mg/dL 17  19  26    Creatinine 0.44 - 1.00 mg/dL 7.82  9.56  2.13   Sodium 135 - 145 mmol/L 134  136  134   Potassium 3.5 - 5.1 mmol/L 3.9  3.7  3.7   Chloride 98 - 111 mmol/L 100  101  100   CO2 22 - 32 mmol/L 23  23  24    Calcium 8.9 - 10.3 mg/dL 9.7  9.6  9.7   Total Protein 6.5 - 8.1 g/dL 7.9  8.1  8.1   Total Bilirubin 0.0 - 1.2 mg/dL 0.6  0.6  0.8   Alkaline Phos 38 - 126 U/L 45  55  50   AST 15 - 41 U/L 21  15  14    ALT 0 - 44 U/L 21  18  14       No results found for: "CEA1", "CEA" / No results found for: "CEA1", "CEA" No results found for: "PSA1" No results found for: "CAN199" No results found for: "CAN125"  Lab Results  Component Value Date   TOTALPROTELP 7.5 04/04/2023   ALBUMINELP 4.0 04/04/2023   A1GS 0.2 04/04/2023   A2GS 0.8 04/04/2023   BETS 1.7 (H) 04/04/2023   GAMS 1.1 04/04/2023   MSPIKE Not Observed 04/04/2023   SPEI Comment 04/04/2023   Lab Results  Component Value Date   TIBC 367 01/24/2023   TIBC 186 (L) 11/14/2017   FERRITIN 151 01/24/2023   FERRITIN 455 (H) 11/14/2017   IRONPCTSAT 20 01/24/2023   IRONPCTSAT 20 11/14/2017   Lab Results  Component Value Date   LDH 109 03/03/2022   LDH 112 12/09/2021   LDH 100 09/08/2021     STUDIES:   No results found.

## 2023-04-11 ENCOUNTER — Inpatient Hospital Stay: Payer: Medicare HMO | Admitting: Hematology

## 2023-04-11 VITALS — BP 115/65 | HR 91 | Temp 98.1°F | Resp 18 | Ht 64.5 in | Wt 159.0 lb

## 2023-04-11 DIAGNOSIS — C9001 Multiple myeloma in remission: Secondary | ICD-10-CM

## 2023-04-11 DIAGNOSIS — I10 Essential (primary) hypertension: Secondary | ICD-10-CM | POA: Diagnosis not present

## 2023-04-11 DIAGNOSIS — Z79899 Other long term (current) drug therapy: Secondary | ICD-10-CM | POA: Diagnosis not present

## 2023-04-11 DIAGNOSIS — C9000 Multiple myeloma not having achieved remission: Secondary | ICD-10-CM | POA: Diagnosis not present

## 2023-04-11 DIAGNOSIS — Z7982 Long term (current) use of aspirin: Secondary | ICD-10-CM | POA: Diagnosis not present

## 2023-04-11 DIAGNOSIS — Z87891 Personal history of nicotine dependence: Secondary | ICD-10-CM | POA: Diagnosis not present

## 2023-04-11 DIAGNOSIS — D508 Other iron deficiency anemias: Secondary | ICD-10-CM

## 2023-04-11 DIAGNOSIS — G629 Polyneuropathy, unspecified: Secondary | ICD-10-CM | POA: Diagnosis not present

## 2023-04-11 NOTE — Patient Instructions (Signed)
 [facility] at [facility] Discharge Instructions   You were seen and examined today by Dr. Ellin Saba.  He reviewed the results of your lab work which are normal/stable.   Continue Revlimid as prescribed.   Return as scheduled.    Thank you for choosing [facility] at [facility] to provide your oncology and hematology care.  To afford each patient quality time with our provider, please arrive at least 15 minutes before your scheduled appointment time.   If you have a lab appointment with the Cancer Center please come in thru the Main Entrance and check in at the main information desk.  You need to re-schedule your appointment should you arrive 10 or more minutes late.  We strive to give you quality time with our providers, and arriving late affects you and other patients whose appointments are after yours.  Also, if you no show three or more times for appointments you may be dismissed from the clinic at the providers discretion.     Again, thank you for choosing [facility].  Our hope is that these requests will decrease the amount of time that you wait before being seen by our physicians.       _____________________________________________________________  Should you have questions after your visit to [facility], please contact our office at [phone] and follow the prompts.  Our office hours are 8:00 a.m. and 4:30 p.m. Monday - Friday.  Please note that voicemails left after 4:00 p.m. may not be returned until the following business day.  We are closed weekends and major holidays.  You do have access to a nurse 24-7, just call the main number to the clinic [phone] and do not press any options, hold on the line and a nurse will answer the phone.    For prescription refill requests, have your pharmacy contact our office and allow 72 hours.    Due to Covid, you will need to wear a mask upon entering the  hospital. If you do not have a mask, a mask will be given to you at the Main Entrance upon arrival. For doctor visits, patients may have 1 support person age 10 or older with them. For treatment visits, patients can not have anyone with them due to social distancing guidelines and our immunocompromised population.

## 2023-04-17 DIAGNOSIS — M1612 Unilateral primary osteoarthritis, left hip: Secondary | ICD-10-CM | POA: Diagnosis not present

## 2023-04-24 ENCOUNTER — Other Ambulatory Visit: Payer: Self-pay | Admitting: Hematology

## 2023-04-24 DIAGNOSIS — C9001 Multiple myeloma in remission: Secondary | ICD-10-CM

## 2023-04-25 ENCOUNTER — Other Ambulatory Visit: Payer: Self-pay | Admitting: Hematology

## 2023-04-25 DIAGNOSIS — C9001 Multiple myeloma in remission: Secondary | ICD-10-CM

## 2023-04-25 NOTE — Telephone Encounter (Signed)
Chart reviewed. Revlimid refilled per last office note with Dr. Katragadda.  

## 2023-05-03 DIAGNOSIS — M160 Bilateral primary osteoarthritis of hip: Secondary | ICD-10-CM | POA: Diagnosis not present

## 2023-05-09 DIAGNOSIS — H524 Presbyopia: Secondary | ICD-10-CM | POA: Diagnosis not present

## 2023-05-17 ENCOUNTER — Other Ambulatory Visit: Payer: Self-pay | Admitting: Hematology

## 2023-05-17 DIAGNOSIS — C9001 Multiple myeloma in remission: Secondary | ICD-10-CM

## 2023-05-19 ENCOUNTER — Other Ambulatory Visit: Payer: Self-pay

## 2023-05-19 DIAGNOSIS — C9001 Multiple myeloma in remission: Secondary | ICD-10-CM

## 2023-05-19 MED ORDER — LENALIDOMIDE 5 MG PO CAPS: 5.0000 mg | ORAL_CAPSULE | Freq: Every day | ORAL | 0 refills | Status: DC

## 2023-05-19 NOTE — Telephone Encounter (Signed)
 Chart reviewed. Revlimid refilled per last office note with Dr. Ellin Saba.

## 2023-05-22 DIAGNOSIS — C9000 Multiple myeloma not having achieved remission: Secondary | ICD-10-CM | POA: Diagnosis not present

## 2023-05-22 DIAGNOSIS — I10 Essential (primary) hypertension: Secondary | ICD-10-CM | POA: Diagnosis not present

## 2023-05-22 DIAGNOSIS — R011 Cardiac murmur, unspecified: Secondary | ICD-10-CM | POA: Diagnosis not present

## 2023-05-22 DIAGNOSIS — M1612 Unilateral primary osteoarthritis, left hip: Secondary | ICD-10-CM | POA: Diagnosis not present

## 2023-05-30 ENCOUNTER — Other Ambulatory Visit (HOSPITAL_COMMUNITY): Payer: Self-pay | Admitting: Internal Medicine

## 2023-05-30 DIAGNOSIS — R011 Cardiac murmur, unspecified: Secondary | ICD-10-CM

## 2023-06-14 ENCOUNTER — Other Ambulatory Visit: Payer: Self-pay | Admitting: Hematology

## 2023-06-14 DIAGNOSIS — C9001 Multiple myeloma in remission: Secondary | ICD-10-CM

## 2023-06-16 ENCOUNTER — Other Ambulatory Visit: Payer: Self-pay

## 2023-06-16 DIAGNOSIS — C9001 Multiple myeloma in remission: Secondary | ICD-10-CM

## 2023-06-16 MED ORDER — LENALIDOMIDE 5 MG PO CAPS: 5.0000 mg | ORAL_CAPSULE | Freq: Every day | ORAL | 0 refills | Status: DC

## 2023-06-16 NOTE — Telephone Encounter (Signed)
 Chart reviewed. Revlimid refilled per last office note with Dr. Ellin Saba.

## 2023-06-19 ENCOUNTER — Ambulatory Visit (HOSPITAL_COMMUNITY): Attending: Cardiology

## 2023-06-19 DIAGNOSIS — R011 Cardiac murmur, unspecified: Secondary | ICD-10-CM | POA: Diagnosis not present

## 2023-06-19 LAB — ECHOCARDIOGRAM COMPLETE
AR max vel: 1.44 cm2
AV Area VTI: 1.36 cm2
AV Area mean vel: 1.32 cm2
AV Mean grad: 21.3 mmHg
AV Peak grad: 37.5 mmHg
Ao pk vel: 3.06 m/s
Area-P 1/2: 2.81 cm2
S' Lateral: 1.9 cm

## 2023-06-26 ENCOUNTER — Telehealth: Payer: Self-pay

## 2023-06-26 ENCOUNTER — Other Ambulatory Visit: Payer: Self-pay | Admitting: Hematology

## 2023-06-26 ENCOUNTER — Ambulatory Visit: Attending: Internal Medicine | Admitting: Internal Medicine

## 2023-06-26 ENCOUNTER — Encounter: Payer: Self-pay | Admitting: Internal Medicine

## 2023-06-26 VITALS — BP 124/72 | HR 81 | Ht 64.0 in | Wt 156.4 lb

## 2023-06-26 DIAGNOSIS — Z136 Encounter for screening for cardiovascular disorders: Secondary | ICD-10-CM

## 2023-06-26 DIAGNOSIS — I517 Cardiomegaly: Secondary | ICD-10-CM | POA: Insufficient documentation

## 2023-06-26 DIAGNOSIS — R931 Abnormal findings on diagnostic imaging of heart and coronary circulation: Secondary | ICD-10-CM | POA: Diagnosis not present

## 2023-06-26 DIAGNOSIS — I350 Nonrheumatic aortic (valve) stenosis: Secondary | ICD-10-CM | POA: Diagnosis not present

## 2023-06-26 DIAGNOSIS — R0602 Shortness of breath: Secondary | ICD-10-CM | POA: Diagnosis not present

## 2023-06-26 DIAGNOSIS — I10 Essential (primary) hypertension: Secondary | ICD-10-CM | POA: Diagnosis not present

## 2023-06-26 MED ORDER — METOPROLOL TARTRATE 25 MG PO TABS: 25.0000 mg | ORAL_TABLET | Freq: Two times a day (BID) | ORAL | 2 refills | Status: DC

## 2023-06-26 MED ORDER — HYDROCHLOROTHIAZIDE 12.5 MG PO CAPS: 12.5000 mg | ORAL_CAPSULE | Freq: Every day | ORAL | 3 refills | Status: DC

## 2023-06-26 NOTE — Patient Instructions (Signed)
 Medication Instructions:  Your physician has recommended you make the following change in your medication:  Decrease Hydrochlorothiazide from 25 mg to 12.5 mg once daily Start Metoprolol tartrate 25 mg twice daily  Continue taking all other medications as prescribed  Labwork: BNP to be completed at [facility] in [place]  Testing/Procedures: Your physician has requested that you have an echocardiogram. Echocardiography is a painless test that uses sound waves to create images of your heart. It provides your doctor with information about the size and shape of your heart and how well your heart's chambers and valves are working. This procedure takes approximately one hour. There are no restrictions for this procedure. Please do NOT wear cologne, perfume, aftershave, or lotions (deodorant is allowed). Please arrive 15 minutes prior to your appointment time.  Please note: We ask at that you not bring children with you during ultrasound (echo/ vascular) testing. Due to room size and safety concerns, children are not allowed in the ultrasound rooms during exams. Our front office staff cannot provide observation of children in our lobby area while testing is being conducted. An adult accompanying a patient to their appointment will only be allowed in the ultrasound room at the discretion of the ultrasound technician under special circumstances. We apologize for any inconvenience.   Follow-Up: Your physician recommends that you schedule a follow-up appointment in: 3 months  Any Other Special Instructions Will Be Listed Below (If Applicable). Thank you for choosing [facility]!     If you need a refill on your cardiac medications before your next appointment, please call your pharmacy.

## 2023-06-26 NOTE — Progress Notes (Addendum)
 Cardiology Office Note  Date: 06/26/2023   ID: Jamie Ewing, DOB December 13, 1940, MRN [id number]  PCP:  Artemisa Bile, MD  Cardiologist:  Preslynn Bier P Rayli Wiederhold, MD Electrophysiologist:  None   History of Present Illness: Jamie Ewing is a 83 y.o. female known to have HTN, was referred to cardiology clinic for evaluation of heart murmur and preop.  Ongoing symptoms of DOE for the last few months.  No orthopnea, PND, leg swelling.  No angina.  No syncope.  She does feel dizzy when she moves her head.  But no exertional dizziness.  Echocardiogram in 2025 March showed normal LVEF, severe LVH with basal septal segment, LVOT obstruction was not assessed per echo report, SAM was noted, moderate aortic valve stenosis was also noted.  Past Medical History:  Diagnosis Date   Back pain    Hypercholesteremia    Hypertension     Past Surgical History:  Procedure Laterality Date   BONE MARROW BIOPSY     LAMINECTOMY N/A 10/06/2017   Procedure: THORACIC NINE AND TEN LAMINECTOMY WITH RESECTION OF TUMOR, THORACIC EIGHT TO THORACIC ELEVEN FUSION WITH PEDICLE SCREW FIXATION;  Surgeon: Agustina Aldrich, MD;  Location: [facility] OR;  Service: Neurosurgery;  Laterality: N/A;   REPLACEMENT TOTAL KNEE Left 2003   TOTAL KNEE ARTHROPLASTY  2001    Current Outpatient Medications  Medication Sig Dispense Refill   acetaminophen  (TYLENOL ) 500 MG tablet Take by mouth.     amLODipine  (NORVASC ) 5 MG tablet Take 5 mg by mouth daily.      calcium carbonate (OSCAL) 1500 (600 Ca) MG TABS tablet Take by mouth.     gabapentin  (NEURONTIN ) 300 MG capsule TAKE 1 CAPSULE BY MOUTH TWICE A DAY 60 capsule 3   hydrochlorothiazide  (MICROZIDE ) 12.5 MG capsule Take 1 capsule (12.5 mg total) by mouth daily. 45 capsule 3   lenalidomide  (REVLIMID ) 5 MG capsule Take 1 capsule (5 mg total) by mouth daily. TAKE 1 CAPSULE BY MOUTH DAILY FOR 21 DAYS, HOLD FOR 7 DAYS, REPEAT EVERY 28 DAYS 21 capsule 0   LORazepam (ATIVAN) 0.5 MG tablet Take 0.25-0.5 mg by  mouth 2 (two) times daily as needed for anxiety.     LOW-DOSE ASPIRIN PO Asprin Ec Low Dose     metoprolol  tartrate (LOPRESSOR ) 25 MG tablet Take 1 tablet (25 mg total) by mouth 2 (two) times daily. 180 tablet 2   Multiple Vitamins-Minerals (THERA-M) TABS Thera-M 9 mg iron-400 mcg tablet     polyethylene glycol (MIRALAX  / GLYCOLAX ) packet Take 17 g by mouth as needed.      potassium chloride  SA (KLOR-CON  M) 20 MEQ tablet TAKE 1 TABLET TWICE DAILY 180 tablet 3   vitamin B-12 (CYANOCOBALAMIN ) 1000 MCG tablet Vitamin B12     No current facility-administered medications for this visit.   Allergies:  Patient has no known allergies.   Social History: The patient  reports that she quit smoking about 17 years ago. Her smoking use included cigarettes. She started smoking about 47 years ago. She has a 15 pack-year smoking history. She has never used smokeless tobacco. She reports current alcohol use. She reports that she does not use drugs.   Family History: The patient's family history includes Diabetes in her paternal aunt and paternal uncle; Hypertension in her daughter, paternal aunt, and paternal uncle; Kidney failure in her father; Stroke in her daughter; Tuberculosis in her mother.   ROS:  Please see the history of present illness. Otherwise, complete review of systems is  positive for none.  All other systems are reviewed and negative.   Physical Exam: VS:  BP 124/72   Pulse 81   Ht 5\' 4"  (1.626 m)   Wt 156 lb 6.4 oz (70.9 kg)   SpO2 99%   BMI 26.85 kg/m , BMI Body mass index is 26.85 kg/m.  Wt Readings from Last 3 Encounters:  06/26/23 156 lb 6.4 oz (70.9 kg)  04/11/23 159 lb (72.1 kg)  01/31/23 158 lb (71.7 kg)    General: Patient appears comfortable at rest. HEENT: Conjunctiva and lids normal, oropharynx clear with moist mucosa. Neck: Supple, no elevated JVP or carotid bruits, no thyromegaly. Lungs: Clear to auscultation, nonlabored breathing at rest. Cardiac: Regular rate and  rhythm, no S3 or significant systolic murmur, no pericardial rub. Abdomen: Soft, nontender, no hepatomegaly, bowel sounds present, no guarding or rebound. Extremities: No pitting edema, distal pulses 2+. Skin: Warm and dry. Musculoskeletal: No kyphosis. Neuropsychiatric: Alert and oriented x3, affect grossly appropriate.  Recent Labwork: 08/14/2022: B Natriuretic Peptide 39.0; TSH 0.716 04/04/2023: ALT 21; AST 21; BUN 17; Creatinine, Ser 0.82; Hemoglobin 12.7; Platelets 219; Potassium 3.9; Sodium 134  No results found for: "CHOL", "TRIG", "HDL", "CHOLHDL", "VLDL", "LDLCALC", "LDLDIRECT"   Assessment and Plan:  Severe LVH of the basal septal segment in March 2025: Ongoing symptoms of DOE for the last few months.  No leg swelling.  Echocardiogram reviewed, there is severe LVH of the basal septal segment, there may be a degree of LVOT obstruction, however, this was not completely assessed per echo report and SAM noted.  Will obtain limited echocardiogram to assess LVOT gradient at rest and Valsalva.  Start metoprolol  tartrate 25 mg twice daily and decrease HCTZ dose from 25 mg to 12.5 mg once daily.  Moderate aortic valve stenosis in March 2025: Exam showed ESM with S2 consistent with moderate aortic valve stenosis.  I believe her symptoms of DOE are likely secondary to severe LVH with basal septal segment, management as stated above.  Will obtain BNP.  I reviewed the pathophysiology, evaluation and management of severe aortic vaginosis with the patient and her granddaughter.  Annual surveillance with echocardiogram.  HTN, controlled: Decrease dose of HCTZ as stated above and continue amlodipine  5 mg once daily.  Goal BP less than 140/90 mmHg.  Preop cardiac risk stratification for hip replacement: Will need to re-evaluate her symptoms after metoprolol  initiation.  Recommendations pending.  Addendum on 07/27/2023 Katlyn West, NP updated patient's subjective symptoms.  SOB improved but she started  noticing fatigue and exercise intolerance after starting metoprolol .  Will switch metoprolol  to diltiazem 60 mg twice daily. Echo also showed moderate to severe aortic valve stenosis, do not drop afterload during the surgery.  Based on comorbidities, moderate risk for any perioperative cardiac complications.    Medication Adjustments/Labs and Tests Ordered: Current medicines are reviewed at length with the patient today.  Concerns regarding medicines are outlined above.    Disposition:  Follow up 3 months  Signed, Karolynn Infantino Beauford Bounds, MD, 06/26/2023 10:31 AM    [facility] at [facility] [address] [place], [place] [address]

## 2023-06-26 NOTE — Telephone Encounter (Signed)
 Checking percert on the following patient for testing scheduled a   Limited Echo Dx: Jamie Ewing

## 2023-06-27 DIAGNOSIS — R0602 Shortness of breath: Secondary | ICD-10-CM | POA: Diagnosis not present

## 2023-06-28 LAB — BRAIN NATRIURETIC PEPTIDE: BNP: 33.1 pg/mL (ref 0.0–100.0)

## 2023-06-29 ENCOUNTER — Telehealth: Payer: Self-pay | Admitting: Internal Medicine

## 2023-06-29 NOTE — Telephone Encounter (Signed)
   Pre-operative Risk Assessment    Patient Name: ALMAS RAKE  DOB: [birth year] MRN: [id number]      Request for Surgical Clearance    Procedure:   LT Total hip Arthroplasty  Date of Surgery:  Clearance TBD                                 Surgeon:  Margarita Rana, M.D. Surgeon's Group or Practice Name:  [facility] Phone number:  [phone] Fax number:  [phone]   Type of Clearance Requested:   - Medical    Type of Anesthesia:  Spinal   Additional requests/questions:  Please fax a copy of Clearance  to the surgeon's office.  Signed, Delaine Lame   06/29/2023, 9:44 AM

## 2023-07-03 ENCOUNTER — Inpatient Hospital Stay: Payer: Medicare HMO | Attending: Hematology

## 2023-07-03 DIAGNOSIS — C9001 Multiple myeloma in remission: Secondary | ICD-10-CM

## 2023-07-03 DIAGNOSIS — C9000 Multiple myeloma not having achieved remission: Secondary | ICD-10-CM | POA: Insufficient documentation

## 2023-07-03 DIAGNOSIS — D508 Other iron deficiency anemias: Secondary | ICD-10-CM

## 2023-07-03 DIAGNOSIS — Z79620 Long term (current) use of immunosuppressive biologic: Secondary | ICD-10-CM | POA: Insufficient documentation

## 2023-07-03 LAB — COMPREHENSIVE METABOLIC PANEL WITH GFR
ALT: 19 U/L (ref 0–44)
AST: 19 U/L (ref 15–41)
Albumin: 3.8 g/dL (ref 3.5–5.0)
Alkaline Phosphatase: 56 U/L (ref 38–126)
Anion gap: 15 (ref 5–15)
BUN: 20 mg/dL (ref 8–23)
CO2: 21 mmol/L — ABNORMAL LOW (ref 22–32)
Calcium: 10 mg/dL (ref 8.9–10.3)
Chloride: 100 mmol/L (ref 98–111)
Creatinine, Ser: 0.79 mg/dL (ref 0.44–1.00)
GFR, Estimated: >60 mL/min (ref >60)
Glucose, Bld: 144 mg/dL — ABNORMAL HIGH (ref 70–99)
Potassium: 3.6 mmol/L (ref 3.5–5.1)
Sodium: 136 mmol/L (ref 135–145)
Total Bilirubin: 0.6 mg/dL (ref 0.0–1.2)
Total Protein: 8.4 g/dL — ABNORMAL HIGH (ref 6.5–8.1)

## 2023-07-03 LAB — CBC WITH DIFFERENTIAL/PLATELET
Abs Immature Granulocytes: 0.02 10*3/uL (ref 0.00–0.07)
Basophils Absolute: 0 10*3/uL (ref 0.0–0.1)
Basophils Relative: 1 %
Eosinophils Absolute: 0.1 10*3/uL (ref 0.0–0.5)
Eosinophils Relative: 3 %
HCT: 40.5 % (ref 36.0–46.0)
Hemoglobin: 13.4 g/dL (ref 12.0–15.0)
Immature Granulocytes: 1 %
Lymphocytes Relative: 29 %
Lymphs Abs: 1.1 10*3/uL (ref 0.7–4.0)
MCH: 33.2 pg (ref 26.0–34.0)
MCHC: 33.1 g/dL (ref 30.0–36.0)
MCV: 100.2 fL — ABNORMAL HIGH (ref 80.0–100.0)
Monocytes Absolute: 0.6 10*3/uL (ref 0.1–1.0)
Monocytes Relative: 17 %
Neutro Abs: 1.9 10*3/uL (ref 1.7–7.7)
Neutrophils Relative %: 49 %
Platelets: 220 10*3/uL (ref 150–400)
RBC: 4.04 MIL/uL (ref 3.87–5.11)
RDW: 13.4 % (ref 11.5–15.5)
WBC: 3.8 10*3/uL — ABNORMAL LOW (ref 4.0–10.5)
nRBC: 0 % (ref 0.0–0.2)

## 2023-07-03 LAB — FERRITIN: Ferritin: 149 ng/mL (ref 11–307)

## 2023-07-03 LAB — IRON AND TIBC
Iron: 81 ug/dL (ref 28–170)
Saturation Ratios: 24 % (ref 10.4–31.8)
TIBC: 334 ug/dL (ref 250–450)
UIBC: 253 ug/dL

## 2023-07-03 LAB — TSH: TSH: 0.936 u[IU]/mL (ref 0.350–4.500)

## 2023-07-04 LAB — KAPPA/LAMBDA LIGHT CHAINS
Kappa free light chain: 32.4 mg/L — ABNORMAL HIGH (ref 3.3–19.4)
Kappa, lambda light chain ratio: 1.4 (ref 0.26–1.65)
Lambda free light chains: 23.1 mg/L (ref 5.7–26.3)

## 2023-07-04 LAB — IMMUNOFIXATION ELECTROPHORESIS
IgA: 1431 mg/dL — ABNORMAL HIGH (ref 64–422)
IgG (Immunoglobin G), Serum: 1221 mg/dL (ref 586–1602)
IgM (Immunoglobulin M), Srm: 16 mg/dL — ABNORMAL LOW (ref 26–217)
Total Protein ELP: 8.1 g/dL (ref 6.0–8.5)

## 2023-07-05 LAB — PROTEIN ELECTROPHORESIS, SERUM
A/G Ratio: 0.8 (ref 0.7–1.7)
Albumin ELP: 3.7 g/dL (ref 2.9–4.4)
Alpha-1-Globulin: 0.2 g/dL (ref 0.0–0.4)
Alpha-2-Globulin: 0.9 g/dL (ref 0.4–1.0)
Beta Globulin: 2.2 g/dL — ABNORMAL HIGH (ref 0.7–1.3)
Gamma Globulin: 1 g/dL (ref 0.4–1.8)
Globulin, Total: 4.4 g/dL — ABNORMAL HIGH (ref 2.2–3.9)
M-Spike, %: 0.9 g/dL — ABNORMAL HIGH
Total Protein ELP: 8.1 g/dL (ref 6.0–8.5)

## 2023-07-09 NOTE — Progress Notes (Signed)
 [facility] [address] [place] [address]    Clinic Day:  07/10/2023  Referring physician: Artemisa Bile, MD  Patient Care Team: Artemisa Bile, MD as PCP - General (Internal Medicine) Mallipeddi, Kennyth Pean, MD as PCP - Cardiology (Cardiology) Paulett Boros, MD as Medical Oncologist (Medical Oncology)   ASSESSMENT & PLAN:   Assessment: 1.  Stage II IgA kappa multiple myeloma, standard risk: -XRT to the spine completed on November 10, 2017 following T9-T10 laminectomy with resection of epidural tumor, T8-T11 posterior fusion by Dr. Adonis Alamin. -5 cycles of RVD from 11/14/2017 through 02/19/2018 followed by stem cell transplant on April 03, 2018. -Revlimid  maintenance therapy 10 mg 3 weeks on/1 week off started on August 24, 2018. -PET scan on 09/02/2019 showed redemonstrated bilateral cervical, right supraclavicular, right axillary hypermetabolic adenopathy with interval resolution of left axillary adenopathy.  Similar lytic and sclerotic lesions in bones with no corresponding FDG uptake.  Persistent increased FDG uptake in both palatine tonsils. -Reviewed myeloma panel from 09/26/2019.  SPEP and immunofixation is negative.  Free light chain ratio is normal with kappa light chains elevated at 32.5. -Right neck lymph node biopsy on 10/21/2019 at [facility] was negative for malignancy. - PET scan (09/29/2022): Right posterior 10th rib hypermetabolism.  Focal hypermetabolic lesion in the left mid femoral shaft. - XRT to the rib lesion and mid femur lesion, 25 Gray in 10 fractions from 11/03/2022 through 11/16/2022. - Revlimid  dose reduced to 5 mg 3 weeks on/1 week off on 01/31/2023 due to decreased tolerance to 10 mg tablet (decrease in energy, appetite and hair thinning)   2.  Myeloma bone disease: -Zometa  was started on 11/21/2017, resumed after transplant on 09/06/2018.    Plan: 1.  Stage II IgA kappa multiple myeloma, standard risk: - Revlimid  dose was reduced on  01/31/2023 due to decreased energy Ewing. - She is being worked up for left hip replacement.  Repeat echocardiogram was reportedly ordered. - We reviewed labs from 07/03/2023: M spike was present at 0.9 g.  Previously undetectable.  FLC ratio was 1.40 normal.  Kappa light chains are 32.4 and stable.  Immunofixation was positive for IgA kappa light chain. - Recommend repeating multiple myeloma labs along with 24-hour urine in 4 weeks and RTC 5 weeks.  Her FLC ratio was very minimally elevated at 1.69 at the time of diagnosis. - If there is any worsening or still persistent M spike, will consider PET CT scan and change in therapy.    2.  Myeloma bone disease: - Zometa  3 years was completed on 03/10/2022.  Will resume at the time of her labs.   3.  Thromboprophylaxis: - Continue aspirin 81 mg daily for thromboprophylaxis.   4.  Hypertension: - Continue amlodipine  and HCTZ.  Blood pressure is 125/70.   5.  Peripheral neuropathy: - Tingling in hands and feet has been stable.  Continue gabapentin  300 mg twice daily.    Orders Placed This Encounter  Procedures   CBC with Differential    Standing Status:   Future    Expected Date:   08/07/2023    Expiration Date:   07/09/2024   Comprehensive metabolic panel    Standing Status:   Future    Expected Date:   08/07/2023    Expiration Date:   07/09/2024   Kappa/lambda light chains    Standing Status:   Future    Expected Date:   08/07/2023    Expiration Date:   07/09/2024  Immunofixation electrophoresis    Standing Status:   Future    Expected Date:   08/07/2023    Expiration Date:   07/09/2024   Protein electrophoresis, serum    Standing Status:   Future    Expected Date:   08/07/2023    Expiration Date:   07/09/2024   24 hr Ur UPEP/UIFE/Light Chains/TP    Standing Status:   Future    Expected Date:   08/07/2023    Expiration Date:   07/09/2024      I,Katie Daubenspeck,acting as a scribe for Paulett Boros, MD.,have documented all  relevant documentation on the behalf of Paulett Boros, MD,as directed by  Paulett Boros, MD while in the presence of Paulett Boros, MD.   I, Paulett Boros MD, have reviewed the above documentation for accuracy and completeness, and I agree with the above.   Paulett Boros, MD   4/21/202512:34 PM  CHIEF COMPLAINT:   Diagnosis: multiple myeloma    Cancer Staging  No matching staging information was found for the patient.    Prior Therapy: 1. Radiation through 11/10/2017. 2. RVD x 5 cycles from 11/14/2017 to 02/19/2018. 3. Stem cell transplant on 04/03/2018.  Current Therapy:  Maintenance Revlimid  10 mg 3/4 weeks    HISTORY OF PRESENT ILLNESS:   Oncology History  Multiple myeloma (HCC)  10/10/2017 Initial Diagnosis   Multiple myeloma (HCC)   11/14/2017 - 02/26/2018 Chemotherapy   The patient had dexamethasone  (DECADRON ) tablet 40 mg, 40 mg (100 % of original dose 40 mg), Oral,  Once, 1 of 1 cycle Dose modification: 40 mg (original dose 40 mg, Cycle 1) Administration: 40 mg (11/14/2017) dexamethasone  (DECADRON ) 4 MG tablet, 1 of 1 cycle, Start date: 11/02/2017, End date: 03/12/2018 lenalidomide  (REVLIMID ) 25 MG capsule, 1 of 1 cycle, Start date: 02/28/2018, End date: 03/12/2018 bortezomib  SQ (VELCADE ) chemo injection 2.5 mg, 1.3 mg/m2 = 2.5 mg, Subcutaneous,  Once, 5 of 6 cycles Administration: 2.5 mg (11/14/2017), 2.5 mg (11/21/2017), 2.5 mg (11/28/2017), 2.5 mg (12/14/2017), 2.5 mg (12/21/2017), 2.5 mg (12/28/2017), 2.5 mg (01/04/2018), 2.5 mg (01/11/2018), 2.5 mg (01/18/2018), 2.5 mg (01/25/2018), 2.5 mg (02/01/2018), 2.5 mg (02/08/2018), 2.5 mg (02/19/2018), 2.5 mg (02/26/2018)  for chemotherapy treatment.       INTERVAL HISTORY:   [name] is a 83 y.o. female presenting to clinic today for follow up of multiple myeloma. She was last seen by me on 04/11/23.  Today, she states that she is doing well overall. Her appetite level is at 75%. Her energy level is  at 50%.  PAST MEDICAL HISTORY:   Past Medical History: Past Medical History:  Diagnosis Date   Back pain    Hypercholesteremia    Hypertension     Surgical History: Past Surgical History:  Procedure Laterality Date   BONE MARROW BIOPSY     LAMINECTOMY N/A 10/06/2017   Procedure: THORACIC NINE AND TEN LAMINECTOMY WITH RESECTION OF TUMOR, THORACIC EIGHT TO THORACIC ELEVEN FUSION WITH PEDICLE SCREW FIXATION;  Surgeon: Agustina Aldrich, MD;  Location: [facility] OR;  Service: Neurosurgery;  Laterality: N/A;   REPLACEMENT TOTAL KNEE Left 2003   TOTAL KNEE ARTHROPLASTY  2001    Social History: Social History   Socioeconomic History   Marital status: Widowed    Spouse name: Not on file   Number of children: 2   Years of education: Not on file   Highest education level: Not on file  Occupational History   Not on file  Tobacco Use   Smoking status:  Former    Current packs/day: 0.00    Average packs/day: 0.5 packs/day for 30.0 years (15.0 ttl pk-yrs)    Types: Cigarettes    Start date: 05/30/1976    Quit date: 05/31/2006    Years since quitting: 17.1   Smokeless tobacco: Never  Vaping Use   Vaping status: Never Used  Substance and Sexual Activity   Alcohol use: Yes    Comment: wine occassionally   Drug use: No   Sexual activity: Not on file  Other Topics Concern   Not on file  Social History Narrative   Not on file   Social Drivers of Health   Financial Resource Strain: Low Risk  (10/19/2017)   Overall Financial Resource Strain (CARDIA)    Difficulty of Paying Living Expenses: Not very hard  Food Insecurity: No Food Insecurity (10/26/2022)   Hunger Vital Sign    Worried About Running Out of Food in the Last Year: Never true    Ran Out of Food in the Last Year: Never true  Transportation Needs: No Transportation Needs (10/26/2022)   PRAPARE - Administrator, Civil Service (Medical): No    Lack of Transportation (Non-Medical): No  Physical Activity: Sufficiently Active  (10/19/2017)   Exercise Vital Sign    Days of Exercise per Week: 4 days    Minutes of Exercise per Session: 120 min  Stress: No Stress Concern Present (10/19/2017)   [employer] of Occupational Health - Occupational Stress Questionnaire    Feeling of Stress : Not at all  Social Connections: Moderately Integrated (10/19/2017)   Social Connection and Isolation Panel [NHANES]    Frequency of Communication with Friends and Family: More than three times a week    Frequency of Social Gatherings with Friends and Family: Once a week    Attends Religious Services: More than 4 times per year    Active Member of [employer] or Organizations: Yes    Attends Banker Meetings: More than 4 times per year    Marital Status: Widowed  Intimate Partner Violence: Not At Risk (10/26/2022)   Humiliation, Afraid, Rape, and Kick questionnaire    Fear of Current or Ex-Partner: No    Emotionally Abused: No    Physically Abused: No    Sexually Abused: No    Family History: Family History  Problem Relation Age of Onset   Tuberculosis Mother    Kidney failure Father    Hypertension Paternal Aunt    Diabetes Paternal Aunt    Hypertension Paternal Uncle    Diabetes Paternal Uncle    Hypertension Daughter    Stroke Daughter     Current Medications:  Current Outpatient Medications:    acetaminophen  (TYLENOL ) 500 MG tablet, Take by mouth., Disp: , Rfl:    amLODipine  (NORVASC ) 5 MG tablet, Take 5 mg by mouth daily. , Disp: , Rfl:    calcium carbonate (OSCAL) 1500 (600 Ca) MG TABS tablet, Take by mouth., Disp: , Rfl:    gabapentin  (NEURONTIN ) 300 MG capsule, TAKE 1 CAPSULE BY MOUTH TWICE A DAY, Disp: 60 capsule, Rfl: 3   hydrochlorothiazide  (MICROZIDE ) 12.5 MG capsule, Take 1 capsule (12.5 mg total) by mouth daily., Disp: 45 capsule, Rfl: 3   lenalidomide  (REVLIMID ) 5 MG capsule, Take 1 capsule (5 mg total) by mouth daily. TAKE 1 CAPSULE BY MOUTH DAILY FOR 21 DAYS, HOLD FOR 7 DAYS, REPEAT EVERY 28 DAYS,  Disp: 21 capsule, Rfl: 0   LORazepam (ATIVAN) 0.5 MG tablet, Take  0.25-0.5 mg by mouth 2 (two) times daily as needed for anxiety., Disp: , Rfl:    LOW-DOSE ASPIRIN PO, Asprin Ec Low Dose, Disp: , Rfl:    metoprolol  tartrate (LOPRESSOR ) 25 MG tablet, Take 1 tablet (25 mg total) by mouth 2 (two) times daily., Disp: 180 tablet, Rfl: 2   Multiple Vitamins-Minerals (THERA-M) TABS, Thera-M 9 mg iron-400 mcg tablet, Disp: , Rfl:    polyethylene glycol (MIRALAX  / GLYCOLAX ) packet, Take 17 g by mouth as needed. , Disp: , Rfl:    potassium chloride  SA (KLOR-CON  M) 20 MEQ tablet, TAKE 1 TABLET TWICE DAILY, Disp: 180 tablet, Rfl: 3   vitamin B-12 (CYANOCOBALAMIN ) 1000 MCG tablet, Vitamin B12, Disp: , Rfl:    Allergies: No Known Allergies  REVIEW OF SYSTEMS:   Review of Systems  Constitutional:  Negative for chills, fatigue and fever.  HENT:   Negative for lump/mass, mouth sores, nosebleeds, sore throat and trouble swallowing.   Eyes:  Negative for eye problems.  Respiratory:  Positive for cough and shortness of breath.   Cardiovascular:  Negative for chest pain, leg swelling and palpitations.  Gastrointestinal:  Positive for constipation. Negative for abdominal pain, diarrhea, nausea and vomiting.  Genitourinary:  Negative for bladder incontinence, difficulty urinating, dysuria, frequency, hematuria and nocturia.   Musculoskeletal:  Negative for arthralgias, back pain, flank pain, myalgias and neck pain.  Skin:  Negative for itching and rash.  Neurological:  Positive for numbness. Negative for dizziness and headaches.  Hematological:  Does not bruise/bleed easily.  Psychiatric/Behavioral:  Negative for depression, sleep disturbance and suicidal ideas. The patient is not nervous/anxious.   All other systems reviewed and are negative.    VITALS:   Blood pressure 125/70, pulse 66, temperature 97.9 F (36.6 C), temperature source Oral, resp. rate 16, weight 155 lb 10.3 oz (70.6 kg), SpO2 95%.  Wt  Readings from Last 3 Encounters:  07/10/23 155 lb 10.3 oz (70.6 kg)  06/26/23 156 lb 6.4 oz (70.9 kg)  04/11/23 159 lb (72.1 kg)    Body mass index is 26.72 kg/m.  Performance status (ECOG): 1 - Symptomatic but completely ambulatory  PHYSICAL EXAM:   Physical Exam Vitals and nursing note reviewed. Exam conducted with a chaperone present.  Constitutional:      Appearance: Normal appearance.  Cardiovascular:     Rate and Rhythm: Normal rate and regular rhythm.     Pulses: Normal pulses.     Heart sounds: Normal heart sounds.  Pulmonary:     Effort: Pulmonary effort is normal.     Breath sounds: Normal breath sounds.  Abdominal:     Palpations: Abdomen is soft. There is no hepatomegaly, splenomegaly or mass.     Tenderness: There is no abdominal tenderness.  Musculoskeletal:     Right lower leg: No edema.     Left lower leg: No edema.  Lymphadenopathy:     Cervical: No cervical adenopathy.     Right cervical: No superficial, deep or posterior cervical adenopathy.    Left cervical: No superficial, deep or posterior cervical adenopathy.     Upper Body:     Right upper body: No supraclavicular or axillary adenopathy.     Left upper body: No supraclavicular or axillary adenopathy.  Neurological:     General: No focal deficit present.     Mental Status: She is alert and oriented to person, place, and time.  Psychiatric:        Mood and Affect: Mood normal.  Behavior: Behavior normal.     LABS:   CBC     Component Value Date/Time   WBC 3.8 (L) 07/03/2023 1113   RBC 4.04 07/03/2023 1113   HGB 13.4 07/03/2023 1113   HCT 40.5 07/03/2023 1113   PLT 220 07/03/2023 1113   MCV 100.2 (H) 07/03/2023 1113   MCH 33.2 07/03/2023 1113   MCHC 33.1 07/03/2023 1113   RDW 13.4 07/03/2023 1113   LYMPHSABS 1.1 07/03/2023 1113   MONOABS 0.6 07/03/2023 1113   EOSABS 0.1 07/03/2023 1113   BASOSABS 0.0 07/03/2023 1113    CMP      Component Value Date/Time   NA 136  07/03/2023 1113   K 3.6 07/03/2023 1113   CL 100 07/03/2023 1113   CO2 21 (L) 07/03/2023 1113   GLUCOSE 144 (H) 07/03/2023 1113   BUN 20 07/03/2023 1113   CREATININE 0.79 07/03/2023 1113   CALCIUM 10.0 07/03/2023 1113   PROT 8.4 (H) 07/03/2023 1113   ALBUMIN  3.8 07/03/2023 1113   AST 19 07/03/2023 1113   ALT 19 07/03/2023 1113   ALKPHOS 56 07/03/2023 1113   BILITOT 0.6 07/03/2023 1113   GFRNONAA >60 07/03/2023 1113   GFRAA >60 11/14/2019 1148     No results found for: "CEA1", "CEA" / No results found for: "CEA1", "CEA" No results found for: "PSA1" No results found for: "ZOX096" No results found for: "CAN125"  Lab Results  Component Value Date   TOTALPROTELP 8.1 07/03/2023   TOTALPROTELP 8.1 07/03/2023   ALBUMINELP 3.7 07/03/2023   A1GS 0.2 07/03/2023   A2GS 0.9 07/03/2023   BETS 2.2 (H) 07/03/2023   GAMS 1.0 07/03/2023   MSPIKE 0.9 (H) 07/03/2023   SPEI Comment 07/03/2023   Lab Results  Component Value Date   TIBC 334 07/03/2023   TIBC 367 01/24/2023   TIBC 186 (L) 11/14/2017   FERRITIN 149 07/03/2023   FERRITIN 151 01/24/2023   FERRITIN 455 (H) 11/14/2017   IRONPCTSAT 24 07/03/2023   IRONPCTSAT 20 01/24/2023   IRONPCTSAT 20 11/14/2017   Lab Results  Component Value Date   LDH 109 03/03/2022   LDH 112 12/09/2021   LDH 100 09/08/2021     STUDIES:   ECHOCARDIOGRAM COMPLETE Result Date: 06/19/2023    ECHOCARDIOGRAM REPORT   Patient Name:   Jamie Ewing Date of Exam: 06/19/2023 Medical Rec #:  [id number]      Height:       64.5 in Accession #:    [id number]     Weight:       159.0 lb Date of Birth:  02-08-1941       BSA:          1.784 m Patient Age:    82 years       BP:           115/65 mmHg Patient Gender: F              HR:           85 bpm. Exam Location:  [address] Procedure: 2D Echo, Cardiac Doppler and Color Doppler (Both Spectral and Color            Flow Doppler were utilized during procedure). Indications:    R01.1 Murmur  History:        Patient  has no prior history of Echocardiogram examinations.                 Signs/Symptoms:Murmur; Risk Factors:Hypertension, Dyslipidemia  and Former Smoker.  Sonographer:    Lula Sale RDCS Referring Phys: [phone] ROY FAGAN IMPRESSIONS  1. Left ventricular ejection fraction, by estimation, is 70 to 75%. Left ventricular ejection fraction by PLAX is 73 %. The left ventricle has hyperdynamic function. The left ventricle has no regional wall motion abnormalities. There is severe asymmetric left ventricular hypertrophy of the basal-septal segment. There may be a degree of LVOT obstruction, however, this was not completely assessed. Left ventricular diastolic parameters are consistent with Grade I diastolic dysfunction (impaired relaxation).  2. Right ventricular systolic function is normal. The right ventricular size is normal. There is normal pulmonary artery systolic pressure. The estimated right ventricular systolic pressure is 25.5 mmHg.  3. SAM is noted. The mitral valve is grossly normal. Trivial mitral valve regurgitation.  4. The aortic valve is tricuspid. The right coronary cusp is calcified and appears fixed. There is moderate calcification of the aortic valve. Aortic valve regurgitation is not visualized. Moderate aortic valve stenosis. Aortic valve area, by VTI measures 1.36 cm. Aortic valve mean gradient measures 21.3 mmHg. Aortic valve Vmax measures 3.06 m/s. Peak gradient 37.5 mmHg, DI is 0.43.  5. The inferior vena cava is normal in size with greater than 50% respiratory variability, suggesting right atrial pressure of 3 mmHg. Comparison(s): No prior Echocardiogram. FINDINGS  Left Ventricle: Left ventricular ejection fraction, by estimation, is 70 to 75%. Left ventricular ejection fraction by PLAX is 73 %. The left ventricle has hyperdynamic function. The left ventricle has no regional wall motion abnormalities. The left ventricular internal cavity size was normal in size. There is severe  asymmetric left ventricular hypertrophy of the basal-septal segment. Left ventricular diastolic parameters are consistent with Grade I diastolic dysfunction (impaired relaxation). Indeterminate filling pressures. Right Ventricle: The right ventricular size is normal. No increase in right ventricular wall thickness. Right ventricular systolic function is normal. There is normal pulmonary artery systolic pressure. The tricuspid regurgitant velocity is 2.37 m/s, and  with an assumed right atrial pressure of 3 mmHg, the estimated right ventricular systolic pressure is 25.5 mmHg. Left Atrium: Left atrial size was normal in size. Right Atrium: Right atrial size was normal in size. Pericardium: There is no evidence of pericardial effusion. Mitral Valve: SAM is noted. The mitral valve is grossly normal. Trivial mitral valve regurgitation. Tricuspid Valve: The tricuspid valve is grossly normal. Tricuspid valve regurgitation is mild. Aortic Valve: The right coronary cusp is calcified and appears fixed. The aortic valve is tricuspid. There is moderate calcification of the aortic valve. Aortic valve regurgitation is not visualized. Moderate aortic stenosis is present. Aortic valve mean  gradient measures 21.3 mmHg. Aortic valve peak gradient measures 37.5 mmHg. Aortic valve area, by VTI measures 1.36 cm. Pulmonic Valve: The pulmonic valve was grossly normal. Pulmonic valve regurgitation is trivial. Aorta: The aortic root and ascending aorta are structurally normal, with no evidence of dilitation. Venous: The inferior vena cava is normal in size with greater than 50% respiratory variability, suggesting right atrial pressure of 3 mmHg. IAS/Shunts: No atrial level shunt detected by color flow Doppler.  LEFT VENTRICLE PLAX 2D LV EF:         Left            Diastology                ventricular     LV e' medial:    7.07 cm/s                ejection  LV E/e' medial:  11.2                fraction by     LV e' lateral:   7.94  cm/s                PLAX is 73      LV E/e' lateral: 10.0                %. LVIDd:         3.20 cm LVIDs:         1.90 cm LV PW:         0.90 cm LV IVS:        1.60 cm LVOT diam:     2.00 cm LV SV:         82 LV SV Index:   46 LVOT Area:     3.14 cm  RIGHT VENTRICLE             IVC RV S prime:     14.10 cm/s  IVC diam: 1.30 cm TAPSE (M-mode): 1.3 cm RVSP:           25.5 mmHg LEFT ATRIUM             Index        RIGHT ATRIUM           Index LA diam:        3.30 cm 1.85 cm/m   RA Pressure: 3.00 mmHg LA Vol (A2C):   35.7 ml 20.01 ml/m  RA Area:     9.89 cm LA Vol (A4C):   35.8 ml 20.07 ml/m  RA Volume:   20.60 ml  11.55 ml/m LA Biplane Vol: 36.5 ml 20.46 ml/m  AORTIC VALVE AV Area (Vmax):    1.44 cm AV Area (Vmean):   1.32 cm AV Area (VTI):     1.36 cm AV Vmax:           306.33 cm/s AV Vmean:          214.667 cm/s AV VTI:            0.601 m AV Peak Grad:      37.5 mmHg AV Mean Grad:      21.3 mmHg LVOT Vmax:         140.00 cm/s LVOT Vmean:        90.200 cm/s LVOT VTI:          0.260 m LVOT/AV VTI ratio: 0.43  AORTA Ao Root diam: 2.90 cm Ao Asc diam:  2.90 cm MITRAL VALVE               TRICUSPID VALVE MV Area (PHT): 2.81 cm    TR Peak grad:   22.5 mmHg MV Decel Time: 270 msec    TR Vmax:        237.00 cm/s MV E velocity: 79.10 cm/s  Estimated RAP:  3.00 mmHg MV A velocity: 94.90 cm/s  RVSP:           25.5 mmHg MV E/A ratio:  0.83                            SHUNTS                            Systemic VTI:  0.26 m  Systemic Diam: 2.00 cm Dinah Franco MD Electronically signed by Dinah Franco MD Signature Date/Time: 06/19/2023/4:33:21 PM    Final

## 2023-07-10 ENCOUNTER — Inpatient Hospital Stay (HOSPITAL_BASED_OUTPATIENT_CLINIC_OR_DEPARTMENT_OTHER): Payer: Medicare HMO | Admitting: Hematology

## 2023-07-10 VITALS — BP 125/70 | HR 66 | Temp 97.9°F | Resp 16 | Wt 155.6 lb

## 2023-07-10 DIAGNOSIS — C9001 Multiple myeloma in remission: Secondary | ICD-10-CM | POA: Diagnosis not present

## 2023-07-10 DIAGNOSIS — C9000 Multiple myeloma not having achieved remission: Secondary | ICD-10-CM | POA: Diagnosis not present

## 2023-07-10 DIAGNOSIS — Z79620 Long term (current) use of immunosuppressive biologic: Secondary | ICD-10-CM | POA: Diagnosis not present

## 2023-07-10 NOTE — Patient Instructions (Addendum)
 [facility] at [facility] Discharge Instructions   You were seen and examined today by Dr. Cheree Cords.  He reviewed the results of your lab work which are mostly normal/stable. Your m-spike has gone up from not observed to 0.9. Your immunofixation is also positive. Your light chains are normal.   Continue Revlimid  as prescribed.   We will see you back in 5 weeks. We will repeat the lab work prior to this visit.    Return as scheduled.    Thank you for choosing [facility] at [facility] to provide your oncology and hematology care.  To afford each patient quality time with our provider, please arrive at least 15 minutes before your scheduled appointment time.   If you have a lab appointment with the Cancer Center please come in thru the Main Entrance and check in at the main information desk.  You need to re-schedule your appointment should you arrive 10 or more minutes late.  We strive to give you quality time with our providers, and arriving late affects you and other patients whose appointments are after yours.  Also, if you no show three or more times for appointments you may be dismissed from the clinic at the providers discretion.     Again, thank you for choosing [facility].  Our hope is that these requests will decrease the amount of time that you wait before being seen by our physicians.       _____________________________________________________________  Should you have questions after your visit to [facility], please contact our office at [phone] and follow the prompts.  Our office hours are 8:00 a.m. and 4:30 p.m. Monday - Friday.  Please note that voicemails left after 4:00 p.m. may not be returned until the following business day.  We are closed weekends and major holidays.  You do have access to a nurse 24-7, just call the main number to the clinic [phone] and do not press any options, hold on  the line and a nurse will answer the phone.    For prescription refill requests, have your pharmacy contact our office and allow 72 hours.    Due to Covid, you will need to wear a mask upon entering the hospital. If you do not have a mask, a mask will be given to you at the Main Entrance upon arrival. For doctor visits, patients may have 1 support person age 67 or older with them. For treatment visits, patients can not have anyone with them due to social distancing guidelines and our immunocompromised population.

## 2023-07-12 ENCOUNTER — Other Ambulatory Visit: Payer: Self-pay | Admitting: Hematology

## 2023-07-12 DIAGNOSIS — C9001 Multiple myeloma in remission: Secondary | ICD-10-CM

## 2023-07-13 ENCOUNTER — Ambulatory Visit: Attending: Internal Medicine

## 2023-07-13 ENCOUNTER — Other Ambulatory Visit: Payer: Self-pay

## 2023-07-13 DIAGNOSIS — R0602 Shortness of breath: Secondary | ICD-10-CM

## 2023-07-13 DIAGNOSIS — R931 Abnormal findings on diagnostic imaging of heart and coronary circulation: Secondary | ICD-10-CM | POA: Diagnosis not present

## 2023-07-13 DIAGNOSIS — C9001 Multiple myeloma in remission: Secondary | ICD-10-CM

## 2023-07-13 LAB — ECHOCARDIOGRAM LIMITED
AR max vel: 1.41 cm2
AV Peak grad: 36.5 mmHg
Ao pk vel: 3.02 m/s
Area-P 1/2: 3.23 cm2
Calc EF: 67.5 %
S' Lateral: 1.3 cm
Single Plane A2C EF: 73 %
Single Plane A4C EF: 62.2 %

## 2023-07-13 MED ORDER — LENALIDOMIDE 5 MG PO CAPS: 5.0000 mg | ORAL_CAPSULE | Freq: Every day | ORAL | 0 refills | Status: DC

## 2023-07-13 NOTE — Telephone Encounter (Signed)
 Chart reviewed. Revlimid refilled per verbal order from Dr. Ellin Saba.

## 2023-07-14 NOTE — Telephone Encounter (Signed)
 Attempted to contact as part of preoperative protocol.  Not available at time of call.  Would like to assess if patient has had improvement in her symptoms post metoprolol  initiation.  Follow-up echocardiogram reassuring.  Patient will need callback.  Chet Cota. Jeffie Widdowson NP-C     07/14/2023, 11:57 AM [facility] [address] Office [phone] Fax [phone]

## 2023-07-17 ENCOUNTER — Other Ambulatory Visit: Payer: Self-pay | Admitting: *Deleted

## 2023-07-18 ENCOUNTER — Telehealth: Payer: Self-pay

## 2023-07-18 NOTE — Telephone Encounter (Signed)
 Spoke with patient who is agreeable to do a tele visit on 5/2 at 9 am. Med rec and consent have been done.

## 2023-07-18 NOTE — Telephone Encounter (Signed)
   Name: Jamie Ewing  DOB: 1940/05/13  MRN: [id number]  Primary Cardiologist: [name], MD   Preoperative team, please contact this patient and set up a phone call appointment for further preoperative risk assessment. Please obtain consent and complete medication review. Thank you for your help.  I confirm that guidance regarding antiplatelet and oral anticoagulation therapy has been completed and, if necessary, noted below.  None requested.  Reassuring echocardiogram  I also confirmed the patient resides in the state of [place] . As per [facility] Medical Board telemedicine laws, the patient must reside in the state in which the provider is licensed.   Carie Charity, NP 07/18/2023, 8:35 AM [facility]

## 2023-07-18 NOTE — Telephone Encounter (Signed)
I left a message for the patient to call our office to schedule a tele visit for pre-op 

## 2023-07-18 NOTE — Telephone Encounter (Signed)
  Patient Consent for Virtual Visit        Jamie Ewing has provided verbal consent on 07/18/2023 for a virtual visit (video or telephone).   CONSENT FOR VIRTUAL VISIT FOR:  Jamie Ewing  By participating in this virtual visit I agree to the following:  I hereby voluntarily request, consent and authorize [facility] and its employed or contracted physicians, physician assistants, nurse practitioners or other licensed health care professionals (the Practitioner), to provide me with telemedicine health care services (the "Services") as deemed necessary by the treating Practitioner. I acknowledge and consent to receive the Services by the Practitioner via telemedicine. I understand that the telemedicine visit will involve communicating with the Practitioner through live audiovisual communication technology and the disclosure of certain medical information by electronic transmission. I acknowledge that I have been given the opportunity to request an in-person assessment or other available alternative prior to the telemedicine visit and am voluntarily participating in the telemedicine visit.  I understand that I have the right to withhold or withdraw my consent to the use of telemedicine in the course of my care at any time, without affecting my right to future care or treatment, and that the Practitioner or I may terminate the telemedicine visit at any time. I understand that I have the right to inspect all information obtained and/or recorded in the course of the telemedicine visit and may receive copies of available information for a reasonable fee.  I understand that some of the potential risks of receiving the Services via telemedicine include:  Delay or interruption in medical evaluation due to technological equipment failure or disruption; Information transmitted may not be sufficient (e.g. poor resolution of images) to allow for appropriate medical decision making by the Practitioner;  and/or  In rare instances, security protocols could fail, causing a breach of personal health information.  Furthermore, I acknowledge that it is my responsibility to provide information about my medical history, conditions and care that is complete and accurate to the best of my ability. I acknowledge that Practitioner's advice, recommendations, and/or decision may be based on factors not within their control, such as incomplete or inaccurate data provided by me or distortions of diagnostic images or specimens that may result from electronic transmissions. I understand that the practice of medicine is not an exact science and that Practitioner makes no warranties or guarantees regarding treatment outcomes. I acknowledge that a copy of this consent can be made available to me via my patient portal [facility] MyChart), or I can request a printed copy by calling the office of [facility].    I understand that my insurance will be billed for this visit.   I have read or had this consent read to me. I understand the contents of this consent, which adequately explains the benefits and risks of the Services being provided via telemedicine.  I have been provided ample opportunity to ask questions regarding this consent and the Services and have had my questions answered to my satisfaction. I give my informed consent for the services to be provided through the use of telemedicine in my medical care

## 2023-07-21 ENCOUNTER — Ambulatory Visit: Attending: Cardiology

## 2023-07-21 NOTE — Progress Notes (Signed)
   Patient Name: Jamie Ewing  DOB: 01-19-1941 MRN: [id number]  Primary Cardiologist: [name], MD  Chart reviewed as part of pre-operative protocol coverage.  Patient was initially scheduled for televisit today for preoperative clearance however patient was seen in clinic on 06/26/2023 by Dr. Mallipeddi for preoperative cardiac evaluation and evaluation of cardiac murmur.  It was recommended that patient start on metoprolol  tartrate 25 mg twice daily and have repeat limited echocardiogram. Dr. Mallipeddi to make further recommendations pending echocardiogram. Patient today reports that her shortness of breath has somewhat improved on metoprolol  however continues to note increased fatigue, she is unable to meet 4 METS of activity.  Will route note to Dr. Mallipeddi regarding symptoms.   Per office protocol the cardiology provider should forward their finalized clearance decision and recommendations regarding antiplatelet therapy to the requesting party below.  Further APP input is not indicated at this time, will remove request from preoperative box.   [name], NP 07/21/2023, 5:20 PM

## 2023-07-25 ENCOUNTER — Telehealth: Payer: Self-pay

## 2023-07-25 NOTE — Telephone Encounter (Signed)
-----   Message from [name] sent at 07/24/2023 11:10 AM EDT ----- Normal LVEF, LVOT gradient is small peak LVOT velocity 2 m/s.  Aortic valve stenosis is noted to be moderate to severe, functionally bicuspid aortic valve, valve area 1.2 cm, V-max 3 m/s and mean AV gradient not obtained.  CVP 3 mmHg. Continue current medications. No changes in the plan.

## 2023-07-25 NOTE — Telephone Encounter (Signed)
 The patient has been notified of the result and verbalized understanding.  All questions (if any) were answered. Jamie Ewing, [place] 07/25/2023 5:01 PM

## 2023-07-27 ENCOUNTER — Other Ambulatory Visit: Payer: Self-pay

## 2023-07-27 MED ORDER — DILTIAZEM HCL ER 60 MG PO CP12: 60.0000 mg | ORAL_CAPSULE | Freq: Two times a day (BID) | ORAL | 3 refills | Status: DC

## 2023-07-28 ENCOUNTER — Telehealth: Payer: Self-pay | Admitting: Internal Medicine

## 2023-07-28 NOTE — Telephone Encounter (Signed)
 Mallipeddi, Jamie Pean, Jamie Ewing  West, Katlyn D, NP; [name], CMA Jamie Ewing, can you discontinue metoprolol  and start diltiazem  60 mg twice daily.  Addended my office note for preop recs.       Previous Messages   ----- Message ----- From: West, Katlyn D, NP Sent: 07/21/2023   5:27 PM EDT To: [name], Jamie Ewing  Dr. Mallipeddi, Patient called today regarding preoperative cardiac evaluation that is currently pending, please see request. You saw her in clinic on 06/29/23 for preoperative clearance, ordered limited echo. Patient reports she is tolerating metoprolol  well, shortness of breath has improved however she continues to note increased fatigue and activity intolerance. She is unable to meet 4 Mets of activity. Routing preoperative request to you for any further recommendations as you have already seen her regarding clearance.

## 2023-07-28 NOTE — Telephone Encounter (Signed)
Spoke to patient who verbalized understanding.

## 2023-07-28 NOTE — Telephone Encounter (Signed)
 Patient stated she was returning staff call.

## 2023-08-07 ENCOUNTER — Other Ambulatory Visit: Payer: Self-pay | Admitting: Hematology

## 2023-08-07 ENCOUNTER — Inpatient Hospital Stay: Attending: Hematology

## 2023-08-07 DIAGNOSIS — C9000 Multiple myeloma not having achieved remission: Secondary | ICD-10-CM | POA: Insufficient documentation

## 2023-08-07 DIAGNOSIS — D508 Other iron deficiency anemias: Secondary | ICD-10-CM | POA: Diagnosis not present

## 2023-08-07 DIAGNOSIS — Z87891 Personal history of nicotine dependence: Secondary | ICD-10-CM | POA: Insufficient documentation

## 2023-08-07 DIAGNOSIS — C9001 Multiple myeloma in remission: Secondary | ICD-10-CM

## 2023-08-07 DIAGNOSIS — Z79899 Other long term (current) drug therapy: Secondary | ICD-10-CM | POA: Diagnosis not present

## 2023-08-07 LAB — CBC WITH DIFFERENTIAL/PLATELET
Abs Immature Granulocytes: 0.01 10*3/uL (ref 0.00–0.07)
Basophils Absolute: 0 10*3/uL (ref 0.0–0.1)
Basophils Relative: 1 %
Eosinophils Absolute: 0.1 10*3/uL (ref 0.0–0.5)
Eosinophils Relative: 2 %
HCT: 38.4 % (ref 36.0–46.0)
Hemoglobin: 12.5 g/dL (ref 12.0–15.0)
Immature Granulocytes: 0 %
Lymphocytes Relative: 24 %
Lymphs Abs: 0.9 10*3/uL (ref 0.7–4.0)
MCH: 32.8 pg (ref 26.0–34.0)
MCHC: 32.6 g/dL (ref 30.0–36.0)
MCV: 100.8 fL — ABNORMAL HIGH (ref 80.0–100.0)
Monocytes Absolute: 0.3 10*3/uL (ref 0.1–1.0)
Monocytes Relative: 8 %
Neutro Abs: 2.6 10*3/uL (ref 1.7–7.7)
Neutrophils Relative %: 65 %
Platelets: 234 10*3/uL (ref 150–400)
RBC: 3.81 MIL/uL — ABNORMAL LOW (ref 3.87–5.11)
RDW: 13.6 % (ref 11.5–15.5)
WBC: 3.9 10*3/uL — ABNORMAL LOW (ref 4.0–10.5)
nRBC: 0 % (ref 0.0–0.2)

## 2023-08-07 LAB — COMPREHENSIVE METABOLIC PANEL WITH GFR
ALT: 24 U/L (ref 0–44)
AST: 21 U/L (ref 15–41)
Albumin: 3.7 g/dL (ref 3.5–5.0)
Alkaline Phosphatase: 70 U/L (ref 38–126)
Anion gap: 10 (ref 5–15)
BUN: 16 mg/dL (ref 8–23)
CO2: 25 mmol/L (ref 22–32)
Calcium: 9.8 mg/dL (ref 8.9–10.3)
Chloride: 98 mmol/L (ref 98–111)
Creatinine, Ser: 0.83 mg/dL (ref 0.44–1.00)
GFR, Estimated: >60 mL/min (ref >60)
Glucose, Bld: 130 mg/dL — ABNORMAL HIGH (ref 70–99)
Potassium: 3.5 mmol/L (ref 3.5–5.1)
Sodium: 133 mmol/L — ABNORMAL LOW (ref 135–145)
Total Bilirubin: 0.6 mg/dL (ref 0.0–1.2)
Total Protein: 8.7 g/dL — ABNORMAL HIGH (ref 6.5–8.1)

## 2023-08-08 ENCOUNTER — Encounter (HOSPITAL_COMMUNITY): Payer: Self-pay | Admitting: Hematology

## 2023-08-08 LAB — KAPPA/LAMBDA LIGHT CHAINS
Kappa free light chain: 36.3 mg/L — ABNORMAL HIGH (ref 3.3–19.4)
Kappa, lambda light chain ratio: 1.53 (ref 0.26–1.65)
Lambda free light chains: 23.7 mg/L (ref 5.7–26.3)

## 2023-08-09 LAB — PROTEIN ELECTROPHORESIS, SERUM
A/G Ratio: 0.8 (ref 0.7–1.7)
Albumin ELP: 3.5 g/dL (ref 2.9–4.4)
Alpha-1-Globulin: 0.3 g/dL (ref 0.0–0.4)
Alpha-2-Globulin: 0.9 g/dL (ref 0.4–1.0)
Beta Globulin: 2.2 g/dL — ABNORMAL HIGH (ref 0.7–1.3)
Gamma Globulin: 1.2 g/dL (ref 0.4–1.8)
Globulin, Total: 4.6 g/dL — ABNORMAL HIGH (ref 2.2–3.9)
M-Spike, %: 0.9 g/dL — ABNORMAL HIGH
Total Protein ELP: 8.1 g/dL (ref 6.0–8.5)

## 2023-08-09 LAB — IMMUNOFIXATION ELECTROPHORESIS
IgA: 1707 mg/dL — ABNORMAL HIGH (ref 64–422)
IgG (Immunoglobin G), Serum: 1208 mg/dL (ref 586–1602)
IgM (Immunoglobulin M), Srm: 15 mg/dL — ABNORMAL LOW (ref 26–217)
Total Protein ELP: 8.1 g/dL (ref 6.0–8.5)

## 2023-08-10 ENCOUNTER — Other Ambulatory Visit: Payer: Self-pay | Admitting: *Deleted

## 2023-08-10 DIAGNOSIS — C9001 Multiple myeloma in remission: Secondary | ICD-10-CM

## 2023-08-10 MED ORDER — LENALIDOMIDE 5 MG PO CAPS: 5.0000 mg | ORAL_CAPSULE | Freq: Every day | ORAL | 0 refills | Status: DC

## 2023-08-11 LAB — UPEP/UIFE/LIGHT CHAINS/TP, 24-HR UR
% BETA, Urine: 0 %
ALPHA 1 URINE: 0 %
Albumin, U: 100 %
Alpha 2, Urine: 0 %
Free Kappa Lt Chains,Ur: 17.94 mg/L (ref 1.17–86.46)
Free Kappa/Lambda Ratio: 5.17 (ref 1.83–14.26)
Free Lambda Lt Chains,Ur: 3.47 mg/L (ref 0.27–15.21)
GAMMA GLOBULIN URINE: 0 %
Total Protein, Urine-Ur/day: 83 mg/(24.h) (ref 30–150)
Total Protein, Urine: 7.5 mg/dL
Total Volume: 1100

## 2023-08-15 ENCOUNTER — Inpatient Hospital Stay

## 2023-08-15 ENCOUNTER — Inpatient Hospital Stay (HOSPITAL_BASED_OUTPATIENT_CLINIC_OR_DEPARTMENT_OTHER): Admitting: Hematology

## 2023-08-15 VITALS — BP 126/62 | HR 86 | Temp 97.2°F | Resp 18 | Ht 64.0 in | Wt 166.0 lb

## 2023-08-15 DIAGNOSIS — D508 Other iron deficiency anemias: Secondary | ICD-10-CM

## 2023-08-15 DIAGNOSIS — Z79899 Other long term (current) drug therapy: Secondary | ICD-10-CM | POA: Diagnosis not present

## 2023-08-15 DIAGNOSIS — Z87891 Personal history of nicotine dependence: Secondary | ICD-10-CM | POA: Diagnosis not present

## 2023-08-15 DIAGNOSIS — C9000 Multiple myeloma not having achieved remission: Secondary | ICD-10-CM | POA: Diagnosis not present

## 2023-08-15 LAB — VITAMIN D 25 HYDROXY (VIT D DEFICIENCY, FRACTURES): Vit D, 25-Hydroxy: 36.51 ng/mL (ref 30–100)

## 2023-08-15 LAB — VITAMIN B12: Vitamin B-12: 209 pg/mL (ref 180–914)

## 2023-08-15 NOTE — Progress Notes (Signed)
 [facility] [address] [place] [address]    Clinic Day:  08/15/2023  Referring physician: Artemisa Bile, MD  Patient Care Team: Artemisa Bile, MD as PCP - General (Internal Medicine) Mallipeddi, Kennyth Pean, MD as PCP - Cardiology (Cardiology) Paulett Boros, MD as Medical Oncologist (Medical Oncology)   ASSESSMENT & PLAN:   Assessment: 1.  Stage II IgA kappa multiple myeloma, standard risk: -XRT to the spine completed on November 10, 2017 following T9-T10 laminectomy with resection of epidural tumor, T8-T11 posterior fusion by Dr. Adonis Alamin. -5 cycles of RVD from 11/14/2017 through 02/19/2018 followed by stem cell transplant on April 03, 2018. -Revlimid  maintenance therapy 10 mg 3 weeks on/1 week off started on August 24, 2018. -PET scan on 09/02/2019 showed redemonstrated bilateral cervical, right supraclavicular, right axillary hypermetabolic adenopathy with interval resolution of left axillary adenopathy.  Similar lytic and sclerotic lesions in bones with no corresponding FDG uptake.  Persistent increased FDG uptake in both palatine tonsils. -Reviewed myeloma panel from 09/26/2019.  SPEP and immunofixation is negative.  Free light chain ratio is normal with kappa light chains elevated at 32.5. -Right neck lymph node biopsy on 10/21/2019 at [facility] was negative for malignancy. - PET scan (09/29/2022): Right posterior 10th rib hypermetabolism.  Focal hypermetabolic lesion in the left mid femoral shaft. - XRT to the rib lesion and mid femur lesion, 25 Gray in 10 fractions from 11/03/2022 through 11/16/2022. - Revlimid  dose reduced to 5 mg 3 weeks on/1 week off on 01/31/2023 due to decreased tolerance to 10 mg tablet (decrease in energy, appetite and hair thinning)   2.  Myeloma bone disease: -Zometa  was started on 11/21/2017, resumed after transplant on 09/06/2018.    Plan: 1.  Stage II IgA kappa multiple myeloma, standard risk: - Revlimid  dose reduced on  01/31/2023 due to decreased energy levels. - On 07/03/2023, M spike was positive at 0.9 g with positive immunofixation. - She is tolerating Revlimid  reasonably well.  She has right eye drooping and photophobia.  She was evaluated by ophthalmology and was prescribed corrective lenses.  She has not reported any improvement in vision.  She will follow-up with them again. - Labs from 08/07/2023: Total protein is 8.7.  M spike is 0.9 g and stable.  CBC shows mild leukopenia.  FLC ratio is normal at 1.53.  Kappa light chains trended up slightly to 36 from 32.  Immunofixation was positive for IgA. - 24-hour urine: Total protein is 83.  M spike and immunofixation were normal. - Recommend further workup with whole-body PET CT scan.  Also recommend bone marrow biopsy with multiple myeloma FISH panel and cytogenetics. - Will send NGS test, B12 and vitamin D levels today.  RTC 2 weeks after biopsy.     2.  Myeloma bone disease: - Zometa  for 3 years was completed on 03/10/2022.  Will resume at the time of relapse.   3.  Thromboprophylaxis: - Continue aspirin 81 mg daily for thromboprophylaxis.   4.  Hypertension: - Continue amlodipine  and HCTZ.  Blood pressure is 126/62.   5.  Peripheral neuropathy: - Tingling in the hands and feet have been stable.  Continue gabapentin  300 mg twice daily.    Orders Placed This Encounter  Procedures   CT BONE MARROW BIOPSY & ASPIRATION    Surgical pathology; cytogenetics; myeloma FISH    Standing Status:   Future    Expected Date:   08/22/2023    Expiration Date:   08/14/2024  Reason for Exam (SYMPTOM  OR DIAGNOSIS REQUIRED):   multiple myeloma    Preferred location?:   [facility]   NM PET Image Restage (PS) Whole Body    Standing Status:   Future    Expected Date:   08/22/2023    Expiration Date:   08/14/2024    If indicated for the ordered procedure, I authorize the administration of a radiopharmaceutical per Radiology protocol:   Yes    Preferred  imaging location?:   [facility]   Miscellaneous [facility] test (send-out)    Standing Status:   Future    Number of Occurrences:   1    Expected Date:   08/15/2023    Expiration Date:   08/14/2024    Test name / description::   Myeloid NGS: test code [id number]   Vitamin B12    Standing Status:   Future    Number of Occurrences:   1    Expected Date:   08/15/2023    Expiration Date:   08/14/2024   Methylmalonic acid, serum    Standing Status:   Future    Number of Occurrences:   1    Expected Date:   08/15/2023    Expiration Date:   08/14/2024   VITAMIN D 25 Hydroxy (Vit-D Deficiency, Fractures)    Standing Status:   Future    Number of Occurrences:   1    Expected Date:   08/15/2023    Expiration Date:   08/14/2024      Nadeen Augusta Teague,acting as a scribe for Paulett Boros, MD.,have documented all relevant documentation on the behalf of Paulett Boros, MD,as directed by  Paulett Boros, MD while in the presence of Paulett Boros, MD.  I, Paulett Boros MD, have reviewed the above documentation for accuracy and completeness, and I agree with the above.    Paulett Boros, MD   5/27/20251:00 PM  CHIEF COMPLAINT:   Diagnosis: multiple myeloma    Cancer Staging  No matching staging information was found for the patient.    Prior Therapy: 1. Radiation through 11/10/2017. 2. RVD x 5 cycles from 11/14/2017 to 02/19/2018. 3. Stem cell transplant on 04/03/2018.  Current Therapy:  Maintenance Revlimid  10 mg 3/4 weeks    HISTORY OF PRESENT ILLNESS:   Oncology History  Multiple myeloma (HCC)  10/10/2017 Initial Diagnosis   Multiple myeloma (HCC)   11/14/2017 - 02/26/2018 Chemotherapy   The patient had dexamethasone  (DECADRON ) tablet 40 mg, 40 mg (100 % of original dose 40 mg), Oral,  Once, 1 of 1 cycle Dose modification: 40 mg (original dose 40 mg, Cycle 1) Administration: 40 mg (11/14/2017) dexamethasone  (DECADRON ) 4 MG tablet, 1 of 1 cycle, Start date:  11/02/2017, End date: 03/12/2018 lenalidomide  (REVLIMID ) 25 MG capsule, 1 of 1 cycle, Start date: 02/28/2018, End date: 03/12/2018 bortezomib  SQ (VELCADE ) chemo injection 2.5 mg, 1.3 mg/m2 = 2.5 mg, Subcutaneous,  Once, 5 of 6 cycles Administration: 2.5 mg (11/14/2017), 2.5 mg (11/21/2017), 2.5 mg (11/28/2017), 2.5 mg (12/14/2017), 2.5 mg (12/21/2017), 2.5 mg (12/28/2017), 2.5 mg (01/04/2018), 2.5 mg (01/11/2018), 2.5 mg (01/18/2018), 2.5 mg (01/25/2018), 2.5 mg (02/01/2018), 2.5 mg (02/08/2018), 2.5 mg (02/19/2018), 2.5 mg (02/26/2018)  for chemotherapy treatment.       INTERVAL HISTORY:   Keiyana is a 83 y.o. female presenting to clinic today for follow up of multiple myeloma. She was last seen by me on 07/10/23.  Today, she states that she is doing well overall. Her appetite level is  at 100%. Her energy level is at 0%. She is accompanied by family members.   Mar is taking Revlimid  as prescribed and denies any new onset pains, though she does report decreased energy levels. Her family members notice Jemmie has a drooping right eye and bilateral photophobia. She has been seen by an ophthalmologist and was given prescription glasses. Her eye symptoms have still persisted. She is willing to see her ophthalmologist again.   PAST MEDICAL HISTORY:   Past Medical History: Past Medical History:  Diagnosis Date   Back pain    Hypercholesteremia    Hypertension     Surgical History: Past Surgical History:  Procedure Laterality Date   BONE MARROW BIOPSY     LAMINECTOMY N/A 10/06/2017   Procedure: THORACIC NINE AND TEN LAMINECTOMY WITH RESECTION OF TUMOR, THORACIC EIGHT TO THORACIC ELEVEN FUSION WITH PEDICLE SCREW FIXATION;  Surgeon: Agustina Aldrich, MD;  Location: [facility] OR;  Service: Neurosurgery;  Laterality: N/A;   REPLACEMENT TOTAL KNEE Left 2003   TOTAL KNEE ARTHROPLASTY  2001    Social History: Social History   Socioeconomic History   Marital status: Widowed    Spouse name: Not on file   Number  of children: 2   Years of education: Not on file   Highest education level: Not on file  Occupational History   Not on file  Tobacco Use   Smoking status: Former    Current packs/day: 0.00    Average packs/day: 0.5 packs/day for 30.0 years (15.0 ttl pk-yrs)    Types: Cigarettes    Start date: 05/30/1976    Quit date: 05/31/2006    Years since quitting: 17.2   Smokeless tobacco: Never  Vaping Use   Vaping status: Never Used  Substance and Sexual Activity   Alcohol use: Yes    Comment: wine occassionally   Drug use: No   Sexual activity: Not on file  Other Topics Concern   Not on file  Social History Narrative   Not on file   Social Drivers of Health   Financial Resource Strain: Low Risk  (10/19/2017)   Overall Financial Resource Strain (CARDIA)    Difficulty of Paying Living Expenses: Not very hard  Food Insecurity: No Food Insecurity (10/26/2022)   Hunger Vital Sign    Worried About Running Out of Food in the Last Year: Never true    Ran Out of Food in the Last Year: Never true  Transportation Needs: No Transportation Needs (10/26/2022)   PRAPARE - Administrator, Civil Service (Medical): No    Lack of Transportation (Non-Medical): No  Physical Activity: Sufficiently Active (10/19/2017)   Exercise Vital Sign    Days of Exercise per Week: 4 days    Minutes of Exercise per Session: 120 min  Stress: No Stress Concern Present (10/19/2017)   [employer] of Occupational Health - Occupational Stress Questionnaire    Feeling of Stress : Not at all  Social Connections: Moderately Integrated (10/19/2017)   Social Connection and Isolation Panel [NHANES]    Frequency of Communication with Friends and Family: More than three times a week    Frequency of Social Gatherings with Friends and Family: Once a week    Attends Religious Services: More than 4 times per year    Active Member of [employer] or Organizations: Yes    Attends Banker Meetings: More than 4 times per  year    Marital Status: Widowed  Intimate Partner Violence: Not At Risk (10/26/2022)  Humiliation, Afraid, Rape, and Kick questionnaire    Fear of Current or Ex-Partner: No    Emotionally Abused: No    Physically Abused: No    Sexually Abused: No    Family History: Family History  Problem Relation Age of Onset   Tuberculosis Mother    Kidney failure Father    Hypertension Paternal Aunt    Diabetes Paternal Aunt    Hypertension Paternal Uncle    Diabetes Paternal Uncle    Hypertension Daughter    Stroke Daughter     Current Medications:  Current Outpatient Medications:    acetaminophen  (TYLENOL ) 500 MG tablet, Take by mouth., Disp: , Rfl:    amLODipine  (NORVASC ) 5 MG tablet, Take 5 mg by mouth daily. , Disp: , Rfl:    calcium carbonate (OSCAL) 1500 (600 Ca) MG TABS tablet, Take by mouth., Disp: , Rfl:    diltiazem  (CARDIZEM  SR) 60 MG 12 hr capsule, Take 1 capsule (60 mg total) by mouth 2 (two) times daily., Disp: 180 capsule, Rfl: 3   gabapentin  (NEURONTIN ) 300 MG capsule, TAKE 1 CAPSULE BY MOUTH TWICE A DAY, Disp: 60 capsule, Rfl: 3   hydrochlorothiazide  (MICROZIDE ) 12.5 MG capsule, Take 1 capsule (12.5 mg total) by mouth daily., Disp: 45 capsule, Rfl: 3   lenalidomide  (REVLIMID ) 5 MG capsule, Take 1 capsule (5 mg total) by mouth daily. TAKE 1 CAPSULE BY MOUTH DAILY FOR 21 DAYS, HOLD FOR 7 DAYS, REPEAT EVERY 28 DAYS, Disp: 21 capsule, Rfl: 0   LORazepam (ATIVAN) 0.5 MG tablet, Take 0.25-0.5 mg by mouth 2 (two) times daily as needed for anxiety., Disp: , Rfl:    LOW-DOSE ASPIRIN PO, Asprin Ec Low Dose, Disp: , Rfl:    Multiple Vitamins-Minerals (THERA-M) TABS, Thera-M 9 mg iron-400 mcg tablet, Disp: , Rfl:    polyethylene glycol (MIRALAX  / GLYCOLAX ) packet, Take 17 g by mouth as needed. , Disp: , Rfl:    potassium chloride  SA (KLOR-CON  M) 20 MEQ tablet, TAKE 1 TABLET TWICE DAILY, Disp: 180 tablet, Rfl: 3   vitamin B-12 (CYANOCOBALAMIN ) 1000 MCG tablet, Vitamin B12, Disp: , Rfl:     Allergies: No Known Allergies  REVIEW OF SYSTEMS:   Review of Systems  Constitutional:  Negative for chills, fatigue and fever.  HENT:   Negative for lump/mass, mouth sores, nosebleeds, sore throat and trouble swallowing.   Eyes:  Negative for eye problems.  Respiratory:  Negative for cough and shortness of breath.   Cardiovascular:  Negative for chest pain, leg swelling and palpitations.  Gastrointestinal:  Positive for constipation. Negative for abdominal pain, diarrhea, nausea and vomiting.  Genitourinary:  Negative for bladder incontinence, difficulty urinating, dysuria, frequency, hematuria and nocturia.   Musculoskeletal:  Negative for arthralgias, back pain, flank pain, myalgias and neck pain.  Skin:  Negative for itching and rash.  Neurological:  Positive for dizziness, headaches and numbness. Seizures: in bilateral hands and feet. Hematological:  Does not bruise/bleed easily.  Psychiatric/Behavioral:  Negative for depression, sleep disturbance and suicidal ideas. The patient is not nervous/anxious.   All other systems reviewed and are negative.    VITALS:   Blood pressure 126/62, pulse 86, temperature (!) 97.2 F (36.2 C), temperature source Tympanic, resp. rate 18, height 5\' 4"  (1.626 m), weight 166 lb (75.3 kg), SpO2 100%.  Wt Readings from Last 3 Encounters:  08/15/23 166 lb (75.3 kg)  07/10/23 155 lb 10.3 oz (70.6 kg)  06/26/23 156 lb 6.4 oz (70.9 kg)    Body mass  index is 28.49 kg/m.  Performance status (ECOG): 1 - Symptomatic but completely ambulatory  PHYSICAL EXAM:   Physical Exam Vitals and nursing note reviewed. Exam conducted with a chaperone present.  Constitutional:      Appearance: Normal appearance.  Cardiovascular:     Rate and Rhythm: Normal rate and regular rhythm.     Pulses: Normal pulses.     Heart sounds: Normal heart sounds.  Pulmonary:     Effort: Pulmonary effort is normal.     Breath sounds: Normal breath sounds.  Abdominal:      Palpations: Abdomen is soft. There is no hepatomegaly, splenomegaly or mass.     Tenderness: There is no abdominal tenderness.  Musculoskeletal:     Right lower leg: No edema.     Left lower leg: No edema.  Lymphadenopathy:     Cervical: No cervical adenopathy.     Right cervical: No superficial, deep or posterior cervical adenopathy.    Left cervical: No superficial, deep or posterior cervical adenopathy.     Upper Body:     Right upper body: No supraclavicular or axillary adenopathy.     Left upper body: No supraclavicular or axillary adenopathy.  Neurological:     General: No focal deficit present.     Mental Status: She is alert and oriented to person, place, and time.  Psychiatric:        Mood and Affect: Mood normal.        Behavior: Behavior normal.     LABS:   CBC     Component Value Date/Time   WBC 3.9 (L) 08/07/2023 0958   RBC 3.81 (L) 08/07/2023 0958   HGB 12.5 08/07/2023 0958   HCT 38.4 08/07/2023 0958   PLT 234 08/07/2023 0958   MCV 100.8 (H) 08/07/2023 0958   MCH 32.8 08/07/2023 0958   MCHC 32.6 08/07/2023 0958   RDW 13.6 08/07/2023 0958   LYMPHSABS 0.9 08/07/2023 0958   MONOABS 0.3 08/07/2023 0958   EOSABS 0.1 08/07/2023 0958   BASOSABS 0.0 08/07/2023 0958    CMP      Component Value Date/Time   NA 133 (L) 08/07/2023 0958   K 3.5 08/07/2023 0958   CL 98 08/07/2023 0958   CO2 25 08/07/2023 0958   GLUCOSE 130 (H) 08/07/2023 0958   BUN 16 08/07/2023 0958   CREATININE 0.83 08/07/2023 0958   CALCIUM 9.8 08/07/2023 0958   PROT 8.7 (H) 08/07/2023 0958   ALBUMIN  3.7 08/07/2023 0958   AST 21 08/07/2023 0958   ALT 24 08/07/2023 0958   ALKPHOS 70 08/07/2023 0958   BILITOT 0.6 08/07/2023 0958   GFRNONAA >60 08/07/2023 0958   GFRAA >60 11/14/2019 1148     No results found for: "CEA1", "CEA" / No results found for: "CEA1", "CEA" No results found for: "PSA1" No results found for: "CAN199" No results found for: "CAN125"  Lab Results  Component  Value Date   TOTALPROTELP 8.1 08/07/2023   TOTALPROTELP 8.1 08/07/2023   ALBUMINELP 3.5 08/07/2023   A1GS 0.3 08/07/2023   A2GS 0.9 08/07/2023   BETS 2.2 (H) 08/07/2023   GAMS 1.2 08/07/2023   MSPIKE 0.9 (H) 08/07/2023   SPEI Comment 08/07/2023   Lab Results  Component Value Date   TIBC 334 07/03/2023   TIBC 367 01/24/2023   TIBC 186 (L) 11/14/2017   FERRITIN 149 07/03/2023   FERRITIN 151 01/24/2023   FERRITIN 455 (H) 11/14/2017   IRONPCTSAT 24 07/03/2023   IRONPCTSAT 20 01/24/2023  IRONPCTSAT 20 11/14/2017   Lab Results  Component Value Date   LDH 109 03/03/2022   LDH 112 12/09/2021   LDH 100 09/08/2021     STUDIES:   No results found.

## 2023-08-15 NOTE — Patient Instructions (Addendum)
 [facility] at [facility] Discharge Instructions   You were seen and examined today by Dr. Cheree Cords.  He reviewed the results of your lab work which are mostly normal/stable. Your m-spike remains elevated at 0.9. Your light chains are also slightly elevated. The urine test was normal.   We will arrange for you to have a bone marrow biopsy and a PET scan prior to your next visit.   Return as scheduled.    Thank you for choosing  Cancer Center at [facility] to provide your oncology and hematology care.  To afford each patient quality time with our provider, please arrive at least 15 minutes before your scheduled appointment time.   If you have a lab appointment with the Cancer Center please come in thru the Main Entrance and check in at the main information desk.  You need to re-schedule your appointment should you arrive 10 or more minutes late.  We strive to give you quality time with our providers, and arriving late affects you and other patients whose appointments are after yours.  Also, if you no show three or more times for appointments you may be dismissed from the clinic at the providers discretion.     Again, thank you for choosing [facility].  Our hope is that these requests will decrease the amount of time that you wait before being seen by our physicians.       _____________________________________________________________  Should you have questions after your visit to [facility], please contact our office at [phone] and follow the prompts.  Our office hours are 8:00 a.m. and 4:30 p.m. Monday - Friday.  Please note that voicemails left after 4:00 p.m. may not be returned until the following business day.  We are closed weekends and major holidays.  You do have access to a nurse 24-7, just call the main number to the clinic [phone] and do not press any options, hold on the line and a nurse will answer  the phone.    For prescription refill requests, have your pharmacy contact our office and allow 72 hours.    Due to Covid, you will need to wear a mask upon entering the hospital. If you do not have a mask, a mask will be given to you at the Main Entrance upon arrival. For doctor visits, patients may have 1 support person age 54 or older with them. For treatment visits, patients can not have anyone with them due to social distancing guidelines and our immunocompromised population.

## 2023-08-15 NOTE — Progress Notes (Signed)
Patient is taking Revlimid as prescribed.  She has not missed any doses and reports no side effects at this time.   

## 2023-08-18 LAB — METHYLMALONIC ACID, SERUM: Methylmalonic Acid, Quantitative: 282 nmol/L (ref 0–378)

## 2023-08-24 ENCOUNTER — Ambulatory Visit (HOSPITAL_COMMUNITY)
Admission: RE | Admit: 2023-08-24 | Discharge: 2023-08-24 | Disposition: A | Discharge status: Home / Self Care | Source: Ambulatory Visit | Attending: Hematology | Admitting: Hematology

## 2023-08-24 DIAGNOSIS — C9000 Multiple myeloma not having achieved remission: Secondary | ICD-10-CM | POA: Insufficient documentation

## 2023-08-24 MED ORDER — FLUDEOXYGLUCOSE F - 18 (FDG) INJECTION
9.3000 | Freq: Once | INTRAVENOUS | Status: AC | PRN
  Administered 2023-08-24: 9.3 via INTRAVENOUS

## 2023-09-02 DIAGNOSIS — H5203 Hypermetropia, bilateral: Secondary | ICD-10-CM | POA: Diagnosis not present

## 2023-09-05 ENCOUNTER — Other Ambulatory Visit: Payer: Self-pay | Admitting: Hematology

## 2023-09-05 DIAGNOSIS — C9001 Multiple myeloma in remission: Secondary | ICD-10-CM

## 2023-09-07 ENCOUNTER — Other Ambulatory Visit: Payer: Self-pay | Admitting: *Deleted

## 2023-09-07 DIAGNOSIS — C9001 Multiple myeloma in remission: Secondary | ICD-10-CM

## 2023-09-07 MED ORDER — LENALIDOMIDE 5 MG PO CAPS: 5.0000 mg | ORAL_CAPSULE | Freq: Every day | ORAL | 0 refills | Status: DC

## 2023-09-11 ENCOUNTER — Other Ambulatory Visit: Payer: Self-pay | Admitting: Radiology

## 2023-09-11 DIAGNOSIS — C9000 Multiple myeloma not having achieved remission: Secondary | ICD-10-CM

## 2023-09-11 NOTE — H&P (Incomplete)
 Chief Complaint: Stage II IgA kappa multiple myeloma ;referred for image guided bone marrow biopsy for further evaluation  Referring Provider(s): Katragadda,S  Supervising Physician: Jennefer Rover  Patient Status: [facility] - Out-pt  History of Present Illness: Jamie Ewing is an 83 y.o. female ex smoker with PMH sig for HLD, HTN, peripheral neuropathy and stage II IgA kappa multiple myeloma, on revlimid . She has rising kappa free light chain , persistent M spike and metabolically active bone lesions on recent PET. She is scheduled today for image guided bone marrow biopsy for further evaluation.   *** Patient is Full Code  Past Medical History:  Diagnosis Date   Back pain    Hypercholesteremia    Hypertension     Past Surgical History:  Procedure Laterality Date   BONE MARROW BIOPSY     LAMINECTOMY N/A 10/06/2017   Procedure: THORACIC NINE AND TEN LAMINECTOMY WITH RESECTION OF TUMOR, THORACIC EIGHT TO THORACIC ELEVEN FUSION WITH PEDICLE SCREW FIXATION;  Surgeon: Louis Shove, MD;  Location: [facility] OR;  Service: Neurosurgery;  Laterality: N/A;   REPLACEMENT TOTAL KNEE Left 2003   TOTAL KNEE ARTHROPLASTY  2001    Allergies: Patient has no known allergies.  Medications: Prior to Admission medications   Medication Sig Start Date End Date Taking? Authorizing Provider  acetaminophen  (TYLENOL ) 500 MG tablet Take by mouth. 10/21/19   [provider]  amLODipine  (NORVASC ) 5 MG tablet Take 5 mg by mouth daily.  05/13/11   [provider]  calcium carbonate (OSCAL) 1500 (600 Ca) MG TABS tablet Take by mouth. 04/17/18   [provider]  diltiazem  (CARDIZEM  SR) 60 MG 12 hr capsule Take 1 capsule (60 mg total) by mouth 2 (two) times daily. 07/27/23   Mallipeddi, Vishnu P, MD  gabapentin  (NEURONTIN ) 300 MG capsule TAKE 1 CAPSULE BY MOUTH TWICE A DAY 06/26/23   Rogers Hai, MD  hydrochlorothiazide  (MICROZIDE ) 12.5 MG capsule Take 1 capsule (12.5 mg total) by mouth  daily. 06/26/23   Mallipeddi, Vishnu P, MD  lenalidomide  (REVLIMID ) 5 MG capsule Take 1 capsule (5 mg total) by mouth daily. TAKE 1 CAPSULE BY MOUTH DAILY FOR 21 DAYS, HOLD FOR 7 DAYS, REPEAT EVERY 28 DAYS 09/07/23   Rogers Hai, MD  LORazepam (ATIVAN) 0.5 MG tablet Take 0.25-0.5 mg by mouth 2 (two) times daily as needed for anxiety. 08/16/22   [provider]  LOW-DOSE ASPIRIN PO Asprin Ec Low Dose    [provider]  Multiple Vitamins-Minerals (THERA-M) TABS Thera-M 9 mg iron-400 mcg tablet    [provider]  polyethylene glycol (MIRALAX  / GLYCOLAX ) packet Take 17 g by mouth as needed.  04/17/18   [provider]  potassium chloride  SA (KLOR-CON  M) 20 MEQ tablet TAKE 1 TABLET TWICE DAILY 04/10/23   Rogers Hai, MD  vitamin B-12 (CYANOCOBALAMIN ) 1000 MCG tablet Vitamin B12    [provider]     Family History  Problem Relation Age of Onset   Tuberculosis Mother    Kidney failure Father    Hypertension Paternal Aunt    Diabetes Paternal Aunt    Hypertension Paternal Uncle    Diabetes Paternal Uncle    Hypertension Daughter    Stroke Daughter     Social History   Socioeconomic History   Marital status: Widowed    Spouse name: Not on file   Number of children: 2   Years of education: Not on file   Highest education level: Not on file  Occupational History   Not on file  Tobacco Use   Smoking status: Former    Current packs/day: 0.00    Average packs/day: 0.5 packs/day for 30.0 years (15.0 ttl pk-yrs)    Types: Cigarettes    Start date: 05/30/1976    Quit date: 05/31/2006    Years since quitting: 17.2   Smokeless tobacco: Never  Vaping Use   Vaping status: Never Used  Substance and Sexual Activity   Alcohol use: Yes    Comment: wine occassionally   Drug use: No   Sexual activity: Not on file  Other Topics Concern   Not on file  Social History Narrative   Not on file   Social Drivers of Health   Financial  Resource Strain: Low Risk  (10/19/2017)   Overall Financial Resource Strain (CARDIA)    Difficulty of Paying Living Expenses: Not very hard  Food Insecurity: No Food Insecurity (10/26/2022)   Hunger Vital Sign    Worried About Running Out of Food in the Last Year: Never true    Ran Out of Food in the Last Year: Never true  Transportation Needs: No Transportation Needs (10/26/2022)   PRAPARE - Administrator, Civil Service (Medical): No    Lack of Transportation (Non-Medical): No  Physical Activity: Sufficiently Active (10/19/2017)   Exercise Vital Sign    Days of Exercise per Week: 4 days    Minutes of Exercise per Session: 120 min  Stress: No Stress Concern Present (10/19/2017)   [employer] of Occupational Health - Occupational Stress Questionnaire    Feeling of Stress : Not at all  Social Connections: Moderately Integrated (10/19/2017)   Social Connection and Isolation Panel    Frequency of Communication with Friends and Family: More than three times a week    Frequency of Social Gatherings with Friends and Family: Once a week    Attends Religious Services: More than 4 times per year    Active Member of [employer] or Organizations: Yes    Attends Banker Meetings: More than 4 times per year    Marital Status: Widowed      Review of Systems  Vital Signs:   Advance Care Plan: no documents on file  Physical Exam  Imaging: NM PET Image Restage (PS) Whole Body Result Date: 09/08/2023 CLINICAL DATA:  Subsequent treatment strategy for multiple myeloma. EXAM: NUCLEAR MEDICINE PET WHOLE BODY TECHNIQUE: 9.30 mCi F-18 FDG was injected intravenously. Full-ring PET imaging was performed from the head to foot after the radiotracer. CT data was obtained and used for attenuation correction and anatomic localization. Fasting blood glucose: 121 mg/dl COMPARISON:  92/88/7975 FINDINGS: Mediastinal blood pool activity: SUV max 2.4 HEAD/NECK: No hypermetabolic activity in the  scalp. No hypermetabolic cervical lymph nodes. Incidental CT findings: none CHEST: No hypermetabolic mediastinal or hilar nodes. No suspicious pulmonary nodules on the CT scan. Incidental CT findings: Aortic atherosclerosis. Coronary artery calcifications. ABDOMEN/PELVIS: No abnormal hypermetabolic activity within the liver, pancreas, adrenal glands, or spleen. No hypermetabolic lymph nodes in the abdomen or pelvis. Aortic atherosclerosis. 5.4 cm right kidney cyst. No follow-up imaging recommended. Incidental CT findings: none SKELETON: Previous tracer avid lesion involving the posterior aspect of the right tenth rib has an SUV max 2.1, axial image 166. On the previous exam SUV max was equal to 8.4. -new increased tracer uptake localizing to the posterior aspect of the left eleventh rib has an SUV max of 6.7. Underlying lucent lesion with pathologic fracture noted, axial  image 173. -new tracer avid lucent bone lesion within the right frontal bone measures 1.6 cm with SUV max 6.5, axial image 35. -new lucent lesion within the right calvarial convexity measures 7 mm with SUV max 1.8, I axial image 8 new focal area of increased uptake localizing to the right pedicle of the L3 vertebra has an SUV max of 2.9, axial image 187 Focal area asymmetric increased uptake within the posterior right iliac bone without underlying lucent bone lesion has an SUV max of 3.1, axial image 212. Also new from previous exam. Incidental CT findings: Advanced arthropathic changes identified within the right sternoclavicular joint. Postoperative change within the thoracic spine. None FDG avid expansile sclerosis involving the posteromedial aspect of the left fourth rib is again noted likely reflecting treated disease. EXTREMITIES: Previous tracer avid lesion involving the mid shaft of the left femur has resolved in the interval. New small focus of increased uptake within the proximal shaft of the left femur with associated cortical lucency has  an SUV max of 2.0, axial image 276. Incidental CT findings: Previous left knee hemiarthroplasty. Advanced degenerative changes noted within both hips. IMPRESSION: 1. Mixed response compared with the prior exam from 09/29/2022. 2. Interval decreased tracer uptake associated with right posterior tenth rib lesion and mid shaft of left femur. 3. Interval resolution of tracer avid lesion involving the mid shaft of the left femur. 4. New tracer avid lucent bone lesions within the posterior aspect of the left eleventh rib and right frontal bone. Findings compatible with metabolically active multiple myeloma. 5. New lucent lesions within the right calvarial convexity and proximal shaft of left femur with mild increased tracer uptake, equivocal for metabolically active multiple myeloma. 6. New foci mild increased uptake noted within the right pedicle of the L3 vertebra and right posterior iliac bone without underlying lucent bone lesions. Also equivocal for metabolically active lesions of multiple myeloma. 7.  Aortic Atherosclerosis (ICD10-I70.0). Electronically Signed   By: Waddell Calk M.D.   On: 09/08/2023 06:51    Labs:  CBC: Recent Labs    01/24/23 0813 04/04/23 1050 07/03/23 1113 08/07/23 0958  WBC 3.1* 3.1* 3.8* 3.9*  HGB 13.4 12.7 13.4 12.5  HCT 40.3 39.3 40.5 38.4  PLT 228 219 220 234    COAGS: No results for input(s): INR, APTT in the last 8760 hours.  BMP: Recent Labs    01/24/23 0813 04/04/23 1050 07/03/23 1113 08/07/23 0958  NA 136 134* 136 133*  K 3.7 3.9 3.6 3.5  CL 101 100 100 98  CO2 23 23 21* 25  GLUCOSE 110* 130* 144* 130*  BUN 19 17 20 16   CALCIUM 9.6 9.7 10.0 9.8  CREATININE 0.93 0.82 0.79 0.83  GFRNONAA >60 >60 >60 >60    LIVER FUNCTION TESTS: Recent Labs    01/24/23 0813 04/04/23 1050 07/03/23 1113 08/07/23 0958  BILITOT 0.6 0.6 0.6 0.6  AST 15 21 19 21   ALT 18 21 19 24   ALKPHOS 55 45 56 70  PROT 8.1 7.9 8.4* 8.7*  ALBUMIN  4.0 3.9 3.8 3.7     TUMOR MARKERS: No results for input(s): AFPTM, CEA, CA199, CHROMGRNA in the last 8760 hours.  Assessment and Plan: 83 y.o. female ex smoker with PMH sig for HLD, HTN, peripheral neuropathy and stage II IgA kappa multiple myeloma, on revlimid . She has rising kappa free light chain , persistent M spike and metabolically active bone lesions on recent PET. She is scheduled today for image guided bone marrow  biopsy for further evaluation. Risks and benefits of procedure was discussed with the patient  including, but not limited to bleeding, infection, damage to adjacent structures or low yield requiring additional tests.  All of the questions were answered and there is agreement to proceed.  Consent signed and in chart.  Patient known to our team from bone marrow biopsy in 2019  Thank you for allowing our service to participate in [facility] 's care.  Electronically Signed: D. Franky Rakers, PA-C   09/11/2023, 12:43 PM      I spent a total of    15 Minutes in face to face in clinical consultation, greater than 50% of which was counseling/coordinating care for image guided bone marrow biopsy

## 2023-09-12 ENCOUNTER — Ambulatory Visit (HOSPITAL_COMMUNITY)
Admission: RE | Admit: 2023-09-12 | Discharge: 2023-09-12 | Disposition: A | Discharge status: Home / Self Care | Source: Ambulatory Visit | Attending: Hematology | Admitting: Hematology

## 2023-09-12 ENCOUNTER — Other Ambulatory Visit: Payer: Self-pay

## 2023-09-12 ENCOUNTER — Encounter (HOSPITAL_COMMUNITY): Payer: Self-pay

## 2023-09-12 DIAGNOSIS — D6489 Other specified anemias: Secondary | ICD-10-CM | POA: Diagnosis not present

## 2023-09-12 DIAGNOSIS — Z1379 Encounter for other screening for genetic and chromosomal anomalies: Secondary | ICD-10-CM | POA: Insufficient documentation

## 2023-09-12 DIAGNOSIS — C9000 Multiple myeloma not having achieved remission: Secondary | ICD-10-CM | POA: Diagnosis not present

## 2023-09-12 DIAGNOSIS — Z87891 Personal history of nicotine dependence: Secondary | ICD-10-CM | POA: Insufficient documentation

## 2023-09-12 DIAGNOSIS — D649 Anemia, unspecified: Secondary | ICD-10-CM | POA: Insufficient documentation

## 2023-09-12 DIAGNOSIS — E785 Hyperlipidemia, unspecified: Secondary | ICD-10-CM | POA: Diagnosis not present

## 2023-09-12 DIAGNOSIS — G629 Polyneuropathy, unspecified: Secondary | ICD-10-CM | POA: Diagnosis not present

## 2023-09-12 DIAGNOSIS — I10 Essential (primary) hypertension: Secondary | ICD-10-CM | POA: Diagnosis not present

## 2023-09-12 DIAGNOSIS — Z8249 Family history of ischemic heart disease and other diseases of the circulatory system: Secondary | ICD-10-CM | POA: Insufficient documentation

## 2023-09-12 HISTORY — DX: Malignant (primary) neoplasm, unspecified: C80.1

## 2023-09-12 LAB — CBC WITH DIFFERENTIAL/PLATELET
Abs Immature Granulocytes: 0.02 10*3/uL (ref 0.00–0.07)
Basophils Absolute: 0 10*3/uL (ref 0.0–0.1)
Basophils Relative: 1 %
Eosinophils Absolute: 0.1 10*3/uL (ref 0.0–0.5)
Eosinophils Relative: 2 %
HCT: 37.1 % (ref 36.0–46.0)
Hemoglobin: 11.8 g/dL — ABNORMAL LOW (ref 12.0–15.0)
Immature Granulocytes: 0 %
Lymphocytes Relative: 24 %
Lymphs Abs: 1.1 10*3/uL (ref 0.7–4.0)
MCH: 32.5 pg (ref 26.0–34.0)
MCHC: 31.8 g/dL (ref 30.0–36.0)
MCV: 102.2 fL — ABNORMAL HIGH (ref 80.0–100.0)
Monocytes Absolute: 0.8 10*3/uL (ref 0.1–1.0)
Monocytes Relative: 17 %
Neutro Abs: 2.6 10*3/uL (ref 1.7–7.7)
Neutrophils Relative %: 56 %
Platelets: 188 10*3/uL (ref 150–400)
RBC: 3.63 MIL/uL — ABNORMAL LOW (ref 3.87–5.11)
RDW: 13.6 % (ref 11.5–15.5)
WBC: 4.7 10*3/uL (ref 4.0–10.5)
nRBC: 0 % (ref 0.0–0.2)

## 2023-09-12 MED ORDER — FENTANYL CITRATE (PF) 100 MCG/2ML IJ SOLN
INTRAMUSCULAR | Status: AC
  Filled 2023-09-12: qty 2

## 2023-09-12 MED ORDER — MIDAZOLAM HCL 2 MG/2ML IJ SOLN
INTRAMUSCULAR | Status: AC | PRN
  Administered 2023-09-12 (×2): 1 mg via INTRAVENOUS

## 2023-09-12 MED ORDER — FENTANYL CITRATE (PF) 100 MCG/2ML IJ SOLN
INTRAMUSCULAR | Status: AC | PRN
  Administered 2023-09-12: 50 ug via INTRAVENOUS

## 2023-09-12 MED ORDER — MIDAZOLAM HCL 2 MG/2ML IJ SOLN
INTRAMUSCULAR | Status: AC
  Filled 2023-09-12: qty 2

## 2023-09-12 MED ORDER — SODIUM CHLORIDE 0.9 % IV SOLN: INTRAVENOUS | Status: DC

## 2023-09-12 MED ORDER — DILTIAZEM HCL ER 60 MG PO CP12
60.0000 mg | ORAL_CAPSULE | Freq: Two times a day (BID) | ORAL | 2 refills | Status: DC
Start: 2023-09-12 — End: 2023-11-28

## 2023-09-12 NOTE — Discharge Instructions (Signed)
 Bone Marrow Aspiration and Bone Marrow Biopsy, Adult, Care After  The following information offers guidance on how to care for yourself after your procedure. Your health care provider may also give you more specific instructions. If you have problems or questions, contact your health care provider.  What can I expect after the procedure?  May remove dressing or bandaid and shower tomorrow.  Keep site clean and dry. Replace with clean dressing or bandaid as necessary. Urgent needs IR clinic [phone] (mon-fri 8-5).  After the procedure, it is common to have: Mild pain and tenderness. Swelling. Bruising. Follow these instructions at home: Incision care  Follow instructions from your health care provider about how to take care of the incision site. Make sure you: Wash your hands with soap and water for at least 20 seconds before and after you change your bandage (dressing). If soap and water are not available, use hand sanitizer. Change your dressing as told by your health care provider. Leave stitches (sutures), skin glue, or adhesive strips in place. These skin closures may need to stay in place for 2 weeks or longer. If adhesive strip edges start to loosen and curl up, you may trim the loose edges. Do not remove adhesive strips completely unless your health care provider tells you to do that. Check your incision site every day for signs of infection. Check for: More redness, swelling, or pain. Fluid or blood. Warmth. Pus or a bad smell. Activity Return to your normal activities as told by your health care provider. Ask your health care provider what activities are safe for you. Do not lift anything that is heavier than 10 lb (4.5 kg), or the limit that you are told, until your health care provider says that it is safe. If you were given a sedative during the procedure, it can affect you for several hours. Do not drive or operate machinery until your health care provider says that it is  safe. General instructions  Take over-the-counter and prescription medicines only as told by your health care provider. Do not take baths, swim, or use a hot tub until your health care provider approves. Ask your health care provider if you may take showers. You may only be allowed to take sponge baths. If directed, put ice on the affected area. To do this: Put ice in a plastic bag. Place a towel between your skin and the bag. Leave the ice on for 20 minutes, 2-3 times a day. If your skin turns bright red, remove the ice right away to prevent skin damage. The risk of skin damage is higher if you cannot feel pain, heat, or cold. Contact a health care provider if: You have signs of infection. Your pain is not controlled with medicine. You have cancer, and a temperature of 100.22F (38C) or higher. Get help right away if: You have a temperature of 101F (38.3C) or higher, or as told by your health care provider. You have bleeding from the incision site that cannot be controlled. This information is not intended to replace advice given to you by your health care provider. Make sure you discuss any questions you have with your health care provider. Document Revised: 07/12/2021 Document Reviewed: 07/12/2021 Elsevier Patient Education  2023 Elsevier Inc.     Moderate Conscious Sedation, Adult, Care After  This sheet gives you information about how to care for yourself after your procedure. Your health care provider may also give you more specific instructions. If you have problems or questions, contact  your health care provider. What can I expect after the procedure? After the procedure, it is common to have: Sleepiness for several hours. Impaired judgment for several hours. Difficulty with balance. Vomiting if you eat too soon. Follow these instructions at home: For the time period you were told by your health care provider:     Rest. Do not participate in activities where you  could fall or become injured. Do not drive or use machinery. Do not drink alcohol. Do not take sleeping pills or medicines that cause drowsiness. Do not make important decisions or sign legal documents. Do not take care of children on your own. Eating and drinking  Follow the diet recommended by your health care provider. Drink enough fluid to keep your urine pale yellow. If you vomit: Drink water, juice, or soup when you can drink without vomiting. Make sure you have little or no nausea before eating solid foods. General instructions Take over-the-counter and prescription medicines only as told by your health care provider. Have a responsible adult stay with you for the time you are told. It is important to have someone help care for you until you are awake and alert. Do not smoke. Keep all follow-up visits as told by your health care provider. This is important. Contact a health care provider if: You are still sleepy or having trouble with balance after 24 hours. You feel light-headed. You keep feeling nauseous or you keep vomiting. You develop a rash. You have a fever. You have redness or swelling around the IV site. Get help right away if: You have trouble breathing. You have new-onset confusion at home. Summary After the procedure, it is common to feel sleepy, have impaired judgment, or feel nauseous if you eat too soon. Rest after you get home. Know the things you should not do after the procedure. Follow the diet recommended by your health care provider and drink enough fluid to keep your urine pale yellow. Get help right away if you have trouble breathing or new-onset confusion at home. This information is not intended to replace advice given to you by your health care provider. Make sure you discuss any questions you have with your health care provider. Document Revised: 07/05/2019 Document Reviewed: 01/31/2019 Elsevier Patient Education  2023 [employer].

## 2023-09-12 NOTE — Sedation Documentation (Addendum)
 RN Marielis Samara pulled 2 mg Versed  and 100 mcg Fentanyl  in IR med room pysix. Pt. Received 2 mg Versed  and 50 mcg Fentanyl  throughout the procedure. RN Leemon Ayala wasted 50 mcg Fentanyl  with Scientist, research (physical sciences) in [employer] pysix.

## 2023-09-12 NOTE — Procedures (Signed)
Interventional Radiology Procedure Note  Procedure: CT guided aspirate and core biopsy of right iliac bone  Complications: None  Recommendations: - Bedrest supine x 1 hrs - Hydrocodone PRN  Pain - Follow biopsy results   Arel Tippen, MD   

## 2023-09-13 ENCOUNTER — Other Ambulatory Visit: Payer: Self-pay | Admitting: *Deleted

## 2023-09-13 MED ORDER — GABAPENTIN 300 MG PO CAPS: 300.0000 mg | ORAL_CAPSULE | Freq: Two times a day (BID) | ORAL | 3 refills | Status: DC

## 2023-09-13 NOTE — Telephone Encounter (Signed)
 Patient request received to have her gabapentin  prescription send to [facility] mail delivery service.  New prescription sent.

## 2023-09-14 LAB — SURGICAL PATHOLOGY

## 2023-09-18 ENCOUNTER — Encounter (HOSPITAL_COMMUNITY): Payer: Self-pay | Admitting: Hematology

## 2023-09-26 ENCOUNTER — Encounter: Payer: Self-pay | Admitting: Hematology

## 2023-09-26 ENCOUNTER — Inpatient Hospital Stay: Attending: Hematology | Admitting: Hematology

## 2023-09-26 VITALS — BP 135/74 | HR 89 | Temp 99.2°F | Resp 18 | Ht 63.0 in | Wt 153.0 lb

## 2023-09-26 DIAGNOSIS — C9000 Multiple myeloma not having achieved remission: Secondary | ICD-10-CM | POA: Diagnosis present

## 2023-09-26 DIAGNOSIS — Z79899 Other long term (current) drug therapy: Secondary | ICD-10-CM | POA: Diagnosis not present

## 2023-09-26 DIAGNOSIS — Z5112 Encounter for antineoplastic immunotherapy: Secondary | ICD-10-CM | POA: Insufficient documentation

## 2023-09-26 MED ORDER — ACYCLOVIR 400 MG PO TABS
400.0000 mg | ORAL_TABLET | Freq: Two times a day (BID) | ORAL | 5 refills | Status: DC
Start: 2023-09-26 — End: 2023-09-26

## 2023-09-26 MED ORDER — PROCHLORPERAZINE MALEATE 10 MG PO TABS: 10.0000 mg | ORAL_TABLET | Freq: Four times a day (QID) | ORAL | 2 refills | Status: DC | PRN

## 2023-09-26 MED ORDER — DEXAMETHASONE 4 MG PO TABS: 4.0000 mg | ORAL_TABLET | ORAL | 2 refills | Status: DC

## 2023-09-26 MED ORDER — ACYCLOVIR 400 MG PO TABS: 400.0000 mg | ORAL_TABLET | Freq: Two times a day (BID) | ORAL | 3 refills | Status: DC

## 2023-09-26 MED ORDER — DEXAMETHASONE 4 MG PO TABS: ORAL_TABLET | ORAL | 7 refills | Status: DC

## 2023-09-26 NOTE — Patient Instructions (Addendum)
 [facility] at [facility] Discharge Instructions   You were seen and examined today by Dr. Rogers.  He reviewed the results of your lab work which are normal/stable.   He reviewed the results of your PET scan. It is showing a couple of new spots on the rib and the facial bone, as well as some areas in the spine.   He reviewed the results of your bone marrow biopsy. It is showing 30-40 % myeloma cells. It has also changed from the previous myeloma you had. It is showing more high risk features. We will need to change treatments. Stop taking Revlimid .   Treatment will consist of two injections called Darzalex and Velcade  (you have received Velcade  in the past). We will also resume giving you Zometa  infusions for bone strength.   We will start treatment this week.   Return as scheduled.    Thank you for choosing [facility] at [facility] to provide your oncology and hematology care.  To afford each patient quality time with our provider, please arrive at least 15 minutes before your scheduled appointment time.   If you have a lab appointment with the Cancer Center please come in thru the Main Entrance and check in at the main information desk.  You need to re-schedule your appointment should you arrive 10 or more minutes late.  We strive to give you quality time with our providers, and arriving late affects you and other patients whose appointments are after yours.  Also, if you no show three or more times for appointments you may be dismissed from the clinic at the providers discretion.     Again, thank you for choosing [facility].  Our hope is that these requests will decrease the amount of time that you wait before being seen by our physicians.       _____________________________________________________________  Should you have questions after your visit to [facility], please contact our office at [phone]  and follow the prompts.  Our office hours are 8:00 a.m. and 4:30 p.m. Monday - Friday.  Please note that voicemails left after 4:00 p.m. may not be returned until the following business day.  We are closed weekends and major holidays.  You do have access to a nurse 24-7, just call the main number to the clinic [phone] and do not press any options, hold on the line and a nurse will answer the phone.    For prescription refill requests, have your pharmacy contact our office and allow 72 hours.    Due to Covid, you will need to wear a mask upon entering the hospital. If you do not have a mask, a mask will be given to you at the Main Entrance upon arrival. For doctor visits, patients may have 1 support person age 33 or older with them. For treatment visits, patients can not have anyone with them due to social distancing guidelines and our immunocompromised population.

## 2023-09-26 NOTE — Progress Notes (Signed)
 [facility] [address], [place] [address]    Clinic Day:  09/26/2023  Referring physician: Sheryle Carwin, MD  Patient Care Team: Sheryle Carwin, MD as PCP - General (Internal Medicine) Mallipeddi, Diannah SQUIBB, MD as PCP - Cardiology (Cardiology) Rogers Hai, MD as Medical Oncologist (Medical Oncology)   ASSESSMENT & PLAN:   Assessment: 1.  Stage II IgA kappa multiple myeloma, standard risk: -XRT to the spine completed on November 10, 2017 following T9-T10 laminectomy with resection of epidural tumor, T8-T11 posterior fusion by Dr. Louis. -5 cycles of RVD from 11/14/2017 through 02/19/2018 followed by stem cell transplant on April 03, 2018. -Revlimid  maintenance therapy 10 mg 3 weeks on/1 week off started on August 24, 2018. -PET scan on 09/02/2019 showed redemonstrated bilateral cervical, right supraclavicular, right axillary hypermetabolic adenopathy with interval resolution of left axillary adenopathy.  Similar lytic and sclerotic lesions in bones with no corresponding FDG uptake.  Persistent increased FDG uptake in both palatine tonsils. -Reviewed myeloma panel from 09/26/2019.  SPEP and immunofixation is negative.  Free light chain ratio is normal with kappa light chains elevated at 32.5. -Right neck lymph node biopsy on 10/21/2019 at [facility] was negative for malignancy. - PET scan (09/29/2022): Right posterior 10th rib hypermetabolism.  Focal hypermetabolic lesion in the left mid femoral shaft. - XRT to the rib lesion and mid femur lesion, 25 Gray in 10 fractions from 11/03/2022 through 11/16/2022. - Revlimid  dose reduced to 5 mg 3 weeks on/1 week off on 01/31/2023 due to decreased tolerance to 10 mg tablet (decrease in energy, appetite and hair thinning), discontinued on 09/26/2023 due to progression - PET scan (08/24/2023): New lucent bone lesions in the posterior aspect of the left 11th rib and right frontal bone.  New lesions in the right calvarial convexity  and proximal shaft of the left femur. - BMBX (09/13/2023): Hypercellular marrow with 30-40% plasma cells. - FISH panel: T p53 mutation, gain of longer of chromosome 1 (1 q.), monosomy 13 - Cytogenetics: Pending - Dara VD cycle 1 started on 10/02/2023   2.  Myeloma bone disease: -Zometa  was started on 11/21/2017, resumed after transplant on 09/06/2018.    Plan: 1.  Recurrent stage II IgA kappa multiple myeloma, high risk: - Last M spike has increased to 0.9 on 07/03/2023.  Prior to that it was undetectable.  Repeat M spike on 08/07/2023 is stable at 0.9. - We reviewed bone marrow biopsy results which showed recurrence of myeloma.  We reviewed FISH results which showed high risk disease. - We have also reviewed images of the PET scan.  Jamie does have a new right eyelid ptosis and is seeing ophthalmology for it.  There is a right frontal lesion on the PET scan.  I have recommended MRI of the orbits with and without contrast.  If there is lesion directly responsible for the drooping of the eyelid, we will consider radiation therapy. - We talked about switching treatments.  Jamie will discontinue Revlimid .  We will start her on Dara RVD regimen.  We discussed regimen and side effects in detail.  I will also reach out to Dr. Arman for future need for CAR-T therapy/bispecific's.   2.  Myeloma bone disease: - Jamie completed Zometa  3 years on 03/10/2022.  Will restart her on Zometa  as Jamie has relapse at this time.   3.  Thromboprophylaxis: - Jamie will continue aspirin 81 mg daily.  Will start her on acyclovir  for shingles prophylaxis.  4.  Hypertension: - Jamie will continue amlodipine  and HCTZ.  Blood pressure is 135/74.   5.  Peripheral neuropathy: - Jamie has mild on and off tingling in the hands and feet which is stable.  Continue gabapentin  300 mg twice daily.  Will closely monitor as we are starting back on Velcade .    Orders Placed This Encounter  Procedures   MR ORBITS W WO CONTRAST    Standing  Status:   Future    Expected Date:   10/03/2023    Expiration Date:   09/25/2024    GRA to provide read?:   Yes    If indicated for the ordered procedure, I authorize the administration of contrast media per Radiology protocol:   Yes    What is the patient's sedation requirement?:   No Sedation    Does the patient have a pacemaker or implanted devices?:   No    Use SRS Protocol?:   No    Preferred imaging location?:   [facility] (table limit - 500lbs)   CBC with Differential    Standing Status:   Future    Expected Date:   10/03/2023    Expiration Date:   10/02/2024   Comprehensive metabolic panel    Standing Status:   Future    Expected Date:   10/03/2023    Expiration Date:   10/02/2024   CBC with Differential    Standing Status:   Future    Expected Date:   10/10/2023    Expiration Date:   10/09/2024   Comprehensive metabolic panel    Standing Status:   Future    Expected Date:   10/10/2023    Expiration Date:   10/09/2024   CBC with Differential    Standing Status:   Future    Expected Date:   10/17/2023    Expiration Date:   10/16/2024   Protein electrophoresis, serum    Standing Status:   Future    Expected Date:   10/24/2023    Expiration Date:   10/23/2024   Kappa/lambda light chains    Standing Status:   Future    Expected Date:   10/24/2023    Expiration Date:   10/23/2024   CBC with Differential    Standing Status:   Future    Expected Date:   10/24/2023    Expiration Date:   10/23/2024   Comprehensive metabolic panel    Standing Status:   Future    Expected Date:   10/24/2023    Expiration Date:   10/23/2024   CBC with Differential    Standing Status:   Future    Expected Date:   10/31/2023    Expiration Date:   10/30/2024   Comprehensive metabolic panel    Standing Status:   Future    Expected Date:   10/31/2023    Expiration Date:   10/30/2024   CBC with Differential    Standing Status:   Future    Expected Date:   11/07/2023    Expiration Date:   11/06/2024       [name],acting as a scribe for Alean Stands, MD.,have documented all relevant documentation on the behalf of Alean Stands, MD,as directed by  Alean Stands, MD while in the presence of Alean Stands, MD.  I, Alean Stands MD, have reviewed the above documentation for accuracy and completeness, and I agree with the above.     Alean Stands, MD   7/8/20253:14 PM  CHIEF COMPLAINT:   Diagnosis: multiple myeloma    Cancer Staging  No matching staging information was found for the patient.    Prior Therapy: 1. Radiation through 11/10/2017. 2. RVD x 5 cycles from 11/14/2017 to 02/19/2018. 3. Stem cell transplant on 04/03/2018.  Maintenance Revlimid  until 09/26/2023  Current Therapy: Dara VD   HISTORY OF PRESENT ILLNESS:   Oncology History  Multiple myeloma (HCC)  10/10/2017 Initial Diagnosis   Multiple myeloma (HCC)   11/14/2017 - 02/26/2018 Chemotherapy   The patient had dexamethasone  (DECADRON ) tablet 40 mg, 40 mg (100 % of original dose 40 mg), Oral,  Once, 1 of 1 cycle Dose modification: 40 mg (original dose 40 mg, Cycle 1) Administration: 40 mg (11/14/2017) dexamethasone  (DECADRON ) 4 MG tablet, 1 of 1 cycle, Start date: 11/02/2017, End date: 03/12/2018 lenalidomide  (REVLIMID ) 25 MG capsule, 1 of 1 cycle, Start date: 02/28/2018, End date: 03/12/2018 bortezomib  SQ (VELCADE ) chemo injection 2.5 mg, 1.3 mg/m2 = 2.5 mg, Subcutaneous,  Once, 5 of 6 cycles Administration: 2.5 mg (11/14/2017), 2.5 mg (11/21/2017), 2.5 mg (11/28/2017), 2.5 mg (12/14/2017), 2.5 mg (12/21/2017), 2.5 mg (12/28/2017), 2.5 mg (01/04/2018), 2.5 mg (01/11/2018), 2.5 mg (01/18/2018), 2.5 mg (01/25/2018), 2.5 mg (02/01/2018), 2.5 mg (02/08/2018), 2.5 mg (02/19/2018), 2.5 mg (02/26/2018)  for chemotherapy treatment.    10/03/2023 -  Chemotherapy   Patient is on Treatment Plan : MYELOMA RELAPSED / REFRACTORY Daratumumab SQ + Bortezomib  + Dexamethasone  (DaraVd) q21d /  Daratumumab SQ q28d         INTERVAL HISTORY:   Jamie Ewing is a 83 y.o. female presenting to clinic today for follow up of multiple myeloma. Jamie was last seen by me on 08/15/23.  Since her last visit, Jamie underwent restaging PET on 08/24/23 that found: Mixed response compared with the prior exam from 09/29/2022. Interval decreased tracer uptake associated with right posterior tenth rib lesion and mid shaft of left femur. Interval resolution of tracer avid lesion involving the mid shaft of the left femur. New tracer avid lucent bone lesions within the posterior aspect of the left eleventh rib and right frontal bone. Findings compatible with metabolically active multiple myeloma. New lucent lesions within the right calvarial convexity and proximal shaft of left femur with mild increased tracer uptake, equivocal for metabolically active multiple myeloma. New foci mild increased uptake noted within the right pedicle of the L3 vertebra and right posterior iliac bone without underlying lucent bone lesions. Also equivocal for metabolically active lesions of multiple myeloma.  Torah had bone marrow biopsy on 09/12/23. Abnormalities in monosomy 13, duplication of 1q, and TP53 deletion were detected on FISH analysis. Pathology of the bone marrow revealed: Hypercellular bone marrow (30%) involved by a kappa restricted plasma cell neoplasm (30 to 40%) in the background of otherwise orderly trilineage hematopoiesis.   Today, Jamie states that Jamie is doing well overall. Her appetite level is at 50%. Her energy level is at 20%. Jamie is accompanied by family members.   Chanae denies any new onset pains. Jamie reports bilateral hip pain, worse on the left, and may be scheduled for hip replacement surgery. Neuropathy is stable in the form of occasional tingling and numbness in the hands and occasional tingling in the feet. Neuropathy is worsened with movement and her fingers will intermittently lock up, causing her to manually  move her fingers with her other hand.    Jeidi has an appointment with a retina specialist next week for her right eye, with associated swelling and drooping of the  area.   Jamie has not seen Dr. Cindie for 2 years.   PAST MEDICAL HISTORY:   Past Medical History: Past Medical History:  Diagnosis Date   Back pain    Cancer (HCC)    mult myeloma 2019   Hypercholesteremia    Hypertension     Surgical History: Past Surgical History:  Procedure Laterality Date   BONE MARROW BIOPSY     LAMINECTOMY N/A 10/06/2017   Procedure: THORACIC NINE AND TEN LAMINECTOMY WITH RESECTION OF TUMOR, THORACIC EIGHT TO THORACIC ELEVEN FUSION WITH PEDICLE SCREW FIXATION;  Surgeon: Louis Shove, MD;  Location: [facility] OR;  Service: Neurosurgery;  Laterality: N/A;   REPLACEMENT TOTAL KNEE Left 2003   TOTAL KNEE ARTHROPLASTY  2001    Social History: Social History   Socioeconomic History   Marital status: Widowed    Spouse name: Not on file   Number of children: 2   Years of education: Not on file   Highest education level: Not on file  Occupational History   Not on file  Tobacco Use   Smoking status: Former    Current packs/day: 0.00    Average packs/day: 0.5 packs/day for 30.0 years (15.0 ttl pk-yrs)    Types: Cigarettes    Start date: 05/30/1976    Quit date: 05/31/2006    Years since quitting: 17.3   Smokeless tobacco: Never  Vaping Use   Vaping status: Never Used  Substance and Sexual Activity   Alcohol use: Yes    Comment: wine occassionally   Drug use: No   Sexual activity: Not on file  Other Topics Concern   Not on file  Social History Narrative   Not on file   Social Drivers of Health   Financial Resource Strain: Low Risk  (10/19/2017)   Overall Financial Resource Strain (CARDIA)    Difficulty of Paying Living Expenses: Not very hard  Food Insecurity: No Food Insecurity (10/26/2022)   Hunger Vital Sign    Worried About Running Out of Food in the Last Year: Never true    Ran Out of  Food in the Last Year: Never true  Transportation Needs: No Transportation Needs (10/26/2022)   PRAPARE - Administrator, Civil Service (Medical): No    Lack of Transportation (Non-Medical): No  Physical Activity: Sufficiently Active (10/19/2017)   Exercise Vital Sign    Days of Exercise per Week: 4 days    Minutes of Exercise per Session: 120 min  Stress: No Stress Concern Present (10/19/2017)   [employer] of Occupational Health - Occupational Stress Questionnaire    Feeling of Stress : Not at all  Social Connections: Moderately Integrated (10/19/2017)   Social Connection and Isolation Panel    Frequency of Communication with Friends and Family: More than three times a week    Frequency of Social Gatherings with Friends and Family: Once a week    Attends Religious Services: More than 4 times per year    Active Member of [employer] or Organizations: Yes    Attends Banker Meetings: More than 4 times per year    Marital Status: Widowed  Intimate Partner Violence: Not At Risk (10/26/2022)   Humiliation, Afraid, Rape, and Kick questionnaire    Fear of Current or Ex-Partner: No    Emotionally Abused: No    Physically Abused: No    Sexually Abused: No    Family History: Family History  Problem Relation Age of Onset   Tuberculosis Mother  Kidney failure Father    Hypertension Paternal Aunt    Diabetes Paternal Aunt    Hypertension Paternal Uncle    Diabetes Paternal Uncle    Hypertension Daughter    Stroke Daughter     Current Medications:  Current Outpatient Medications:    acyclovir  (ZOVIRAX ) 400 MG tablet, Take 1 tablet (400 mg total) by mouth 2 (two) times daily., Disp: 60 tablet, Rfl: 3   dexamethasone  (DECADRON ) 4 MG tablet, Take 1 tablet (4 mg total) by mouth once a week. Take 20 mg by mouth weekly the day after darzalex injection, Disp: 20 tablet, Rfl: 2   metoprolol  tartrate (LOPRESSOR ) 25 MG tablet, Take 25 mg by mouth 2 (two) times daily., Disp: ,  Rfl:    prochlorperazine  (COMPAZINE ) 10 MG tablet, Take 1 tablet (10 mg total) by mouth every 6 (six) hours as needed., Disp: 60 tablet, Rfl: 2   acetaminophen  (TYLENOL ) 500 MG tablet, Take by mouth., Disp: , Rfl:    amLODipine  (NORVASC ) 5 MG tablet, Take 5 mg by mouth daily. , Disp: , Rfl:    calcium carbonate (OSCAL) 1500 (600 Ca) MG TABS tablet, Take by mouth., Disp: , Rfl:    diltiazem  (CARDIZEM  SR) 60 MG 12 hr capsule, Take 1 capsule (60 mg total) by mouth 2 (two) times daily., Disp: 180 capsule, Rfl: 2   gabapentin  (NEURONTIN ) 300 MG capsule, Take 1 capsule (300 mg total) by mouth 2 (two) times daily., Disp: 60 capsule, Rfl: 3   hydrochlorothiazide  (MICROZIDE ) 12.5 MG capsule, Take 1 capsule (12.5 mg total) by mouth daily., Disp: 45 capsule, Rfl: 3   lenalidomide  (REVLIMID ) 5 MG capsule, Take 1 capsule (5 mg total) by mouth daily. TAKE 1 CAPSULE BY MOUTH DAILY FOR 21 DAYS, HOLD FOR 7 DAYS, REPEAT EVERY 28 DAYS, Disp: 21 capsule, Rfl: 0   LORazepam (ATIVAN) 0.5 MG tablet, Take 0.25-0.5 mg by mouth 2 (two) times daily as needed for anxiety., Disp: , Rfl:    LOW-DOSE ASPIRIN PO, Asprin Ec Low Dose, Disp: , Rfl:    Multiple Vitamins-Minerals (THERA-M) TABS, Thera-M 9 mg iron-400 mcg tablet, Disp: , Rfl:    polyethylene glycol (MIRALAX  / GLYCOLAX ) packet, Take 17 g by mouth as needed. , Disp: , Rfl:    potassium chloride  SA (KLOR-CON  M) 20 MEQ tablet, TAKE 1 TABLET TWICE DAILY, Disp: 180 tablet, Rfl: 3   vitamin B-12 (CYANOCOBALAMIN ) 1000 MCG tablet, Vitamin B12, Disp: , Rfl:    Allergies: No Known Allergies  REVIEW OF SYSTEMS:   Review of Systems  Constitutional:  Negative for chills, fatigue and fever.  HENT:   Negative for lump/mass, mouth sores, nosebleeds, sore throat and trouble swallowing.   Eyes:  Negative for eye problems.       +right eye inflammation and drooping  Respiratory:  Positive for shortness of breath. Negative for cough.   Cardiovascular:  Negative for chest pain, leg  swelling and palpitations.  Gastrointestinal:  Negative for abdominal pain, constipation, diarrhea, nausea and vomiting.  Genitourinary:  Negative for bladder incontinence, difficulty urinating, dysuria, frequency, hematuria and nocturia.   Musculoskeletal:  Positive for arthralgias (in bilateral hips, worse on left). Negative for back pain, flank pain, myalgias and neck pain.  Skin:  Negative for itching and rash.  Neurological:  Positive for dizziness (when standing), headaches (occasional) and numbness (and tingling in the hands and feet).  Hematological:  Does not bruise/bleed easily.  Psychiatric/Behavioral:  Negative for depression, sleep disturbance and suicidal ideas. The patient is  not nervous/anxious.   All other systems reviewed and are negative.    VITALS:   Blood pressure 135/74, pulse 89, temperature 99.2 F (37.3 C), temperature source Tympanic, resp. rate 18, height 5' 3 (1.6 m), weight 153 lb (69.4 kg), SpO2 95%.  Wt Readings from Last 3 Encounters:  09/26/23 153 lb (69.4 kg)  08/15/23 166 lb (75.3 kg)  07/10/23 155 lb 10.3 oz (70.6 kg)    Body mass index is 27.1 kg/m.  Performance status (ECOG): 1 - Symptomatic but completely ambulatory  PHYSICAL EXAM:   Physical Exam Vitals and nursing note reviewed. Exam conducted with a chaperone present.  Constitutional:      Appearance: Normal appearance.  Cardiovascular:     Rate and Rhythm: Normal rate and regular rhythm.     Pulses: Normal pulses.     Heart sounds: Normal heart sounds.  Pulmonary:     Effort: Pulmonary effort is normal.     Breath sounds: Normal breath sounds.  Abdominal:     Palpations: Abdomen is soft. There is no hepatomegaly, splenomegaly or mass.     Tenderness: There is no abdominal tenderness.  Musculoskeletal:     Right lower leg: No edema.     Left lower leg: No edema.  Lymphadenopathy:     Cervical: No cervical adenopathy.     Right cervical: No superficial, deep or posterior cervical  adenopathy.    Left cervical: No superficial, deep or posterior cervical adenopathy.     Upper Body:     Right upper body: No supraclavicular or axillary adenopathy.     Left upper body: No supraclavicular or axillary adenopathy.  Neurological:     General: No focal deficit present.     Mental Status: Jamie is alert and oriented to person, place, and time.  Psychiatric:        Mood and Affect: Mood normal.        Behavior: Behavior normal.     LABS:   CBC     Component Value Date/Time   WBC 4.7 09/12/2023 1015   RBC 3.63 (L) 09/12/2023 1015   HGB 11.8 (L) 09/12/2023 1015   HCT 37.1 09/12/2023 1015   PLT 188 09/12/2023 1015   MCV 102.2 (H) 09/12/2023 1015   MCH 32.5 09/12/2023 1015   MCHC 31.8 09/12/2023 1015   RDW 13.6 09/12/2023 1015   LYMPHSABS 1.1 09/12/2023 1015   MONOABS 0.8 09/12/2023 1015   EOSABS 0.1 09/12/2023 1015   BASOSABS 0.0 09/12/2023 1015    CMP      Component Value Date/Time   NA 133 (L) 08/07/2023 0958   K 3.5 08/07/2023 0958   CL 98 08/07/2023 0958   CO2 25 08/07/2023 0958   GLUCOSE 130 (H) 08/07/2023 0958   BUN 16 08/07/2023 0958   CREATININE 0.83 08/07/2023 0958   CALCIUM 9.8 08/07/2023 0958   PROT 8.7 (H) 08/07/2023 0958   ALBUMIN  3.7 08/07/2023 0958   AST 21 08/07/2023 0958   ALT 24 08/07/2023 0958   ALKPHOS 70 08/07/2023 0958   BILITOT 0.6 08/07/2023 0958   GFRNONAA >60 08/07/2023 0958   GFRAA >60 11/14/2019 1148     No results found for: CEA1, CEA / No results found for: CEA1, CEA No results found for: PSA1 No results found for: CAN199 No results found for: RJW874  Lab Results  Component Value Date   TOTALPROTELP 8.1 08/07/2023   TOTALPROTELP 8.1 08/07/2023   ALBUMINELP 3.5 08/07/2023   A1GS 0.3 08/07/2023  A2GS 0.9 08/07/2023   BETS 2.2 (H) 08/07/2023   GAMS 1.2 08/07/2023   MSPIKE 0.9 (H) 08/07/2023   SPEI Comment 08/07/2023   Lab Results  Component Value Date   TIBC 334 07/03/2023   TIBC 367  01/24/2023   TIBC 186 (L) 11/14/2017   FERRITIN 149 07/03/2023   FERRITIN 151 01/24/2023   FERRITIN 455 (H) 11/14/2017   IRONPCTSAT 24 07/03/2023   IRONPCTSAT 20 01/24/2023   IRONPCTSAT 20 11/14/2017   Lab Results  Component Value Date   LDH 109 03/03/2022   LDH 112 12/09/2021   LDH 100 09/08/2021     STUDIES:   CT BONE MARROW BIOPSY & ASPIRATION Result Date: 09/12/2023 INDICATION: 83 year old female with history of multiple myeloma. EXAM: CT-GUIDED BONE MARROW BIOPSY AND ASPIRATION MEDICATIONS: None ANESTHESIA/SEDATION: Fentanyl  50 mcg IV; Versed  2 mg IV Sedation Time: 7 minutes; The patient was continuously monitored during the procedure by the interventional radiology nurse under my direct supervision. COMPLICATIONS: None immediate. PROCEDURE: Informed consent was obtained from the patient following an explanation of the procedure, risks, benefits and alternatives. The patient understands, agrees and consents for the procedure. All questions were addressed. A time out was performed prior to the initiation of the procedure. The patient was positioned prone and non-contrast localization CT was performed of the pelvis to demonstrate the iliac marrow spaces. The operative site was prepped and draped in the usual sterile fashion. Under sterile conditions and local anesthesia, a 22 gauge spinal needle was utilized for procedural planning. Next, an 11 gauge coaxial bone biopsy needle was advanced into the right iliac marrow space. Needle position was confirmed with CT imaging. Initially, a bone marrow aspiration was performed. Next, a bone marrow biopsy was obtained with the 11 gauge outer bone marrow device. The needle was removed and superficial hemostasis was obtained with manual compression. A dressing was applied. The patient tolerated the procedure well without immediate post procedural complication. IMPRESSION: Successful CT guided right iliac bone marrow aspiration and core biopsy. Ester Sides, MD [facility] Electronically Signed   By: Ester Sides M.D.   On: 09/12/2023 13:05

## 2023-09-26 NOTE — Progress Notes (Signed)
Patient is taking Revlimid as prescribed.  She has not missed any doses and reports no side effects at this time.   

## 2023-09-26 NOTE — Progress Notes (Signed)
 DISCONTINUE ON PATHWAY REGIMEN - Multiple Myeloma and Other Plasma Cell Dyscrasias     A cycle is every 21 days:     Bortezomib       Lenalidomide       Dexamethasone    **Always confirm dose/schedule in your pharmacy ordering system**  PRIOR TREATMENT: [id number]: VRd (Bortezomib  1.3 mg/m2 Subcut D1, 8, 15 + Lenalidomide  25 mg + Dexamethasone  40 mg) q21 Days x 4-6 Cycles Maximum Prior to Stem Cell Harvest  START ON PATHWAY REGIMEN - Multiple Myeloma and Other Plasma Cell Dyscrasias     Cycles 1 through 3: A cycle is every 21 days:     Dexamethasone       Bortezomib       Daratumumab and hyaluronidase-fihj    Cycles 4 through 8: A cycle is every 21 days:     Dexamethasone       Bortezomib       Daratumumab and hyaluronidase-fihj    Cycles 9 and beyond: A cycle is every 28 days:     Daratumumab and hyaluronidase-fihj   **Always confirm dose/schedule in your pharmacy ordering system**  Patient Characteristics: Multiple Myeloma, Relapsed / Refractory, Second through Fourth Lines of Therapy, Not a Candidate for CAR T-cell Therapy, Fit or Candidate for Triplet Therapy, Lenalidomide -Refractory or Lenalidomide -based Regimen Not Preferred, Candidate for Anti-CD38  Antibody Disease Classification: Multiple Myeloma Therapeutic Status: Relapsed R2-ISS Staging: II Line of Therapy: Second Line Anti-CD38 Antibody Candidacy: Candidate for Anti-CD38 Antibody Lenalidomide -based Regimen Preference/Candidacy: Lenalidomide -Refractory Intent of Therapy: Non-Curative / Palliative Intent, Discussed with Patient

## 2023-09-27 ENCOUNTER — Other Ambulatory Visit: Payer: Self-pay

## 2023-09-28 ENCOUNTER — Other Ambulatory Visit: Payer: Self-pay

## 2023-09-28 ENCOUNTER — Ambulatory Visit (HOSPITAL_COMMUNITY)
Admission: RE | Admit: 2023-09-28 | Discharge: 2023-09-28 | Disposition: A | Discharge status: Home / Self Care | Source: Ambulatory Visit | Attending: Hematology | Admitting: Hematology

## 2023-09-28 ENCOUNTER — Encounter: Payer: Self-pay | Admitting: Hematology

## 2023-09-28 ENCOUNTER — Encounter (HOSPITAL_COMMUNITY): Payer: Self-pay

## 2023-09-28 DIAGNOSIS — C9000 Multiple myeloma not having achieved remission: Secondary | ICD-10-CM | POA: Diagnosis not present

## 2023-09-28 DIAGNOSIS — H02401 Unspecified ptosis of right eyelid: Secondary | ICD-10-CM | POA: Insufficient documentation

## 2023-09-28 DIAGNOSIS — R220 Localized swelling, mass and lump, head: Secondary | ICD-10-CM | POA: Diagnosis not present

## 2023-09-28 MED ORDER — GADOBUTROL 1 MMOL/ML IV SOLN
7.0000 mL | Freq: Once | INTRAVENOUS | Status: AC | PRN
  Administered 2023-09-28: 7 mL via INTRAVENOUS

## 2023-09-29 ENCOUNTER — Encounter: Payer: Self-pay | Admitting: Hematology

## 2023-10-02 ENCOUNTER — Ambulatory Visit: Admitting: Internal Medicine

## 2023-10-02 ENCOUNTER — Inpatient Hospital Stay

## 2023-10-02 ENCOUNTER — Other Ambulatory Visit: Payer: Self-pay

## 2023-10-02 VITALS — BP 130/68 | HR 88 | Temp 98.0°F | Resp 18 | Wt 159.0 lb

## 2023-10-02 DIAGNOSIS — Z5112 Encounter for antineoplastic immunotherapy: Secondary | ICD-10-CM | POA: Diagnosis not present

## 2023-10-02 DIAGNOSIS — C9000 Multiple myeloma not having achieved remission: Secondary | ICD-10-CM

## 2023-10-02 DIAGNOSIS — Z79899 Other long term (current) drug therapy: Secondary | ICD-10-CM | POA: Diagnosis not present

## 2023-10-02 LAB — CBC WITH DIFFERENTIAL/PLATELET
Abs Immature Granulocytes: 0.01 K/uL (ref 0.00–0.07)
Basophils Absolute: 0 K/uL (ref 0.0–0.1)
Basophils Relative: 1 %
Eosinophils Absolute: 0 K/uL (ref 0.0–0.5)
Eosinophils Relative: 1 %
HCT: 36.2 % (ref 36.0–46.0)
Hemoglobin: 11.7 g/dL — ABNORMAL LOW (ref 12.0–15.0)
Immature Granulocytes: 0 %
Lymphocytes Relative: 27 %
Lymphs Abs: 1 K/uL (ref 0.7–4.0)
MCH: 32.8 pg (ref 26.0–34.0)
MCHC: 32.3 g/dL (ref 30.0–36.0)
MCV: 101.4 fL — ABNORMAL HIGH (ref 80.0–100.0)
Monocytes Absolute: 0.4 K/uL (ref 0.1–1.0)
Monocytes Relative: 9 %
Neutro Abs: 2.4 K/uL (ref 1.7–7.7)
Neutrophils Relative %: 62 %
Platelets: 200 K/uL (ref 150–400)
RBC: 3.57 MIL/uL — ABNORMAL LOW (ref 3.87–5.11)
RDW: 14.1 % (ref 11.5–15.5)
WBC: 3.8 K/uL — ABNORMAL LOW (ref 4.0–10.5)
nRBC: 0 % (ref 0.0–0.2)

## 2023-10-02 LAB — COMPREHENSIVE METABOLIC PANEL WITH GFR
ALT: 27 U/L (ref 0–44)
AST: 22 U/L (ref 15–41)
Albumin: 3.3 g/dL — ABNORMAL LOW (ref 3.5–5.0)
Alkaline Phosphatase: 64 U/L (ref 38–126)
Anion gap: 11 (ref 5–15)
BUN: 14 mg/dL (ref 8–23)
CO2: 24 mmol/L (ref 22–32)
Calcium: 9.4 mg/dL (ref 8.9–10.3)
Chloride: 99 mmol/L (ref 98–111)
Creatinine, Ser: 0.84 mg/dL (ref 0.44–1.00)
GFR, Estimated: >60 mL/min (ref >60)
Glucose, Bld: 142 mg/dL — ABNORMAL HIGH (ref 70–99)
Potassium: 3.9 mmol/L (ref 3.5–5.1)
Sodium: 134 mmol/L — ABNORMAL LOW (ref 135–145)
Total Bilirubin: 0.5 mg/dL (ref 0.0–1.2)
Total Protein: 8.6 g/dL — ABNORMAL HIGH (ref 6.5–8.1)

## 2023-10-02 LAB — TYPE AND SCREEN
ABO/RH(D): O POS
Antibody Screen: NEGATIVE

## 2023-10-02 MED ORDER — SODIUM CHLORIDE 0.9 % IV SOLN: Freq: Once | INTRAVENOUS | Status: AC

## 2023-10-02 MED ORDER — DIPHENHYDRAMINE HCL 25 MG PO CAPS
25.0000 mg | ORAL_CAPSULE | Freq: Once | ORAL | Status: AC
  Administered 2023-10-02: 25 mg via ORAL
  Filled 2023-10-02: qty 1

## 2023-10-02 MED ORDER — MONTELUKAST SODIUM 10 MG PO TABS
10.0000 mg | ORAL_TABLET | Freq: Once | ORAL | Status: AC
  Administered 2023-10-02: 10 mg via ORAL
  Filled 2023-10-02: qty 1

## 2023-10-02 MED ORDER — BORTEZOMIB CHEMO SQ INJECTION 3.5 MG (2.5MG/ML)
1.3000 mg/m2 | Freq: Once | INTRAMUSCULAR | Status: AC
  Administered 2023-10-02: 2.25 mg via SUBCUTANEOUS
  Filled 2023-10-02: qty 0.9

## 2023-10-02 MED ORDER — ZOLEDRONIC ACID 4 MG/100ML IV SOLN
4.0000 mg | Freq: Once | INTRAVENOUS | Status: AC
Start: 2023-10-02 — End: 2023-10-02
  Administered 2023-10-02: 4 mg via INTRAVENOUS
  Filled 2023-10-02: qty 100

## 2023-10-02 MED ORDER — ACETAMINOPHEN 325 MG PO TABS
650.0000 mg | ORAL_TABLET | Freq: Once | ORAL | Status: AC
  Administered 2023-10-02: 650 mg via ORAL
  Filled 2023-10-02: qty 2

## 2023-10-02 MED ORDER — DARATUMUMAB-HYALURONIDASE-FIHJ 1800-30000 MG-UT/15ML ~~LOC~~ SOLN
1800.0000 mg | Freq: Once | SUBCUTANEOUS | Status: AC
  Administered 2023-10-02: 1800 mg via SUBCUTANEOUS
  Filled 2023-10-02: qty 15

## 2023-10-02 MED ORDER — DEXAMETHASONE 4 MG PO TABS
20.0000 mg | ORAL_TABLET | Freq: Once | ORAL | Status: AC
  Administered 2023-10-02: 20 mg via ORAL
  Filled 2023-10-02: qty 5

## 2023-10-02 NOTE — Patient Instructions (Signed)
 [facility] [place] - A DEPT OF [facility]  Discharge Instructions: Thank you for choosing [facility] to provide your oncology and hematology care.  If you have a lab appointment with the Cancer Center - please note that after April 8th, 2024, all labs will be drawn in the cancer center.  You do not have to check in or register with the main entrance as you have in the past but will complete your check-in in the cancer center.  Wear comfortable clothing and clothing appropriate for easy access to any Portacath or PICC line.   We strive to give you quality time with your provider. You may need to reschedule your appointment if you arrive late (15 or more minutes).  Arriving late affects you and other patients whose appointments are after yours.  Also, if you miss three or more appointments without notifying the office, you may be dismissed from the clinic at the provider's discretion.      For prescription refill requests, have your pharmacy contact our office and allow 72 hours for refills to be completed.    Today you received the following chemotherapy and/or immunotherapy agents bortezoimid, darzalex  faspro   To help prevent nausea and vomiting after your treatment, we encourage you to take your nausea medication as directed.  BELOW ARE SYMPTOMS THAT SHOULD BE REPORTED IMMEDIATELY: *FEVER GREATER THAN 100.4 F (38 C) OR HIGHER *CHILLS OR SWEATING *NAUSEA AND VOMITING THAT IS NOT CONTROLLED WITH YOUR NAUSEA MEDICATION *UNUSUAL SHORTNESS OF BREATH *UNUSUAL BRUISING OR BLEEDING *URINARY PROBLEMS (pain or burning when urinating, or frequent urination) *BOWEL PROBLEMS (unusual diarrhea, constipation, pain near the anus) TENDERNESS IN MOUTH AND THROAT WITH OR WITHOUT PRESENCE OF ULCERS (sore throat, sores in mouth, or a toothache) UNUSUAL RASH, SWELLING OR PAIN  UNUSUAL VAGINAL DISCHARGE OR ITCHING   Items with * indicate a potential emergency and  should be followed up as soon as possible or go to the Emergency Department if any problems should occur.  Please show the CHEMOTHERAPY ALERT CARD or IMMUNOTHERAPY ALERT CARD at check-in to the Emergency Department and triage nurse.  Should you have questions after your visit or need to cancel or reschedule your appointment, please contact [facility] CANCER CTR [place] - A DEPT OF [facility] [phone]  and follow the prompts.  Office hours are 8:00 a.m. to 4:30 p.m. Monday - Friday. Please note that voicemails left after 4:00 p.m. may not be returned until the following business day.  We are closed weekends and major holidays. You have access to a nurse at all times for urgent questions. Please call the main number to the clinic [phone] and follow the prompts.  For any non-urgent questions, you may also contact your provider using MyChart. We now offer e-Visits for anyone 90 and older to request care online for non-urgent symptoms. For details visit [email or web address].   Also download the MyChart app! Go to the app store, search MyChart, open the app, select [place], and log in with your MyChart username and password.

## 2023-10-02 NOTE — Progress Notes (Signed)
 Treatment given per orders. Patient was observed for 2 hours post Darzalex  injection per protocol.   Patient tolerated it well without problems. Vitals stable and discharged home from clinic via wheelchair. Follow up as scheduled.

## 2023-10-02 NOTE — Progress Notes (Deleted)
 1120 Patient is taking Revlimid  as prescribed.  Patient  has not missed any doses and reports no side effects at this time.

## 2023-10-02 NOTE — Progress Notes (Signed)
   10/02/23 1200  Spiritual Encounters  Type of Visit Initial  Care provided to: Patient  Referral source Other (comment) (Chaplain making rounds)  Reason for visit  (Spiritual Care Education)  OnCall Visit No  Spiritual Framework  Presenting Themes Values and beliefs;Caregiving needs;Rituals and practive;Community and relationships  Community/Connection Family;Friend(s);Spiritual leader;Faith community  Patient Stress Factors None identified  Family Stress Factors None identified  Interventions  Spiritual Care Interventions Made Established relationship of care and support;Reflective listening;Narrative/life review;Explored values/beliefs/practices/strengths;Encouragement  Intervention Outcomes  Outcomes Connection to spiritual care;Awareness around self/spiritual resourses;Awareness of support  Spiritual Care Plan  Spiritual Care Issues Still Outstanding No further spiritual care needs at this time (see row info)   Reason for Visit: Chaplain making rounds on the floor visiting infusion Pts   Description of Visit: Arriving in the room I found Jamie Ewing seated in a recliner chair receiving treatment and no support person was present.  I introduced myself as the new chaplain for the cancer center and offered a brief education on the role of a chaplain and the support we can offer to our patients, caregivers, and staff.  I began a conversation with her, and she was receptive to talking with me.   [name] was quick to share with me that her support needs are currently being met by her 2 daughters, 2 grandchildren, and 1 great-grandchild.  She stated that her faith sustains her and that she has an assurance that whether her cancer is healed or it takes her, she is a Engineer, petroleum either way.   Hearing this I prepared to move on and as I reminded, I gave my full name to Brentley so she would be able to contact me if she wanted to talk. When she heard my full name she realized that she knew of me from events  I had done with her church in the past.  She is a member of [employer].  We talked some about our mutual connections there.   [name] appears to posses the resources of inner emotional and spiritual strength, strong family and church connections, and faith.  I was not able to assess any needs she may have in our brief time.   Plan of Care: Though I do not assess that Shane needs continuing Spiritual Care interventions, I will check in with her from time to time to ensure she continues to find what support needs she may have.   Maude Roll, MDiv   Chaplain, [facility]  [email or web address] [phone]

## 2023-10-02 NOTE — Progress Notes (Signed)
 Pharmacist Chemotherapy Monitoring - Initial Assessment    Anticipated start date: 10/02/23   The following has been reviewed per standard work regarding the patient's treatment regimen: The patient's diagnosis, treatment plan and drug doses, and organ/hematologic function Lab orders and baseline tests specific to treatment regimen  The treatment plan start date, drug sequencing, and pre-medications Prior authorization status  Patient's documented medication list, including drug-drug interaction screen and prescriptions for anti-emetics and supportive care specific to the treatment regimen The drug concentrations, fluid compatibility, administration routes, and timing of the medications to be used The patient's access for treatment and lifetime cumulative dose history, if applicable  The patient's medication allergies and previous infusion related reactions, if applicable   Changes made to treatment plan:  N/A  Follow up needed:  N/A   Jamie Ewing, [facility], 10/02/2023  11:00 AM

## 2023-10-03 ENCOUNTER — Telehealth: Payer: Self-pay

## 2023-10-03 LAB — PRETREATMENT RBC PHENOTYPE

## 2023-10-03 NOTE — Telephone Encounter (Signed)
 24 hour follow up -patient stated she is feeling fine, no issues today. She comes for another tx on Thursday. Patient is aware.

## 2023-10-04 ENCOUNTER — Other Ambulatory Visit: Payer: Self-pay | Admitting: Hematology

## 2023-10-04 DIAGNOSIS — C9001 Multiple myeloma in remission: Secondary | ICD-10-CM

## 2023-10-05 ENCOUNTER — Inpatient Hospital Stay

## 2023-10-05 VITALS — BP 136/65 | HR 89 | Temp 96.4°F | Resp 20

## 2023-10-05 DIAGNOSIS — Z5112 Encounter for antineoplastic immunotherapy: Secondary | ICD-10-CM | POA: Diagnosis not present

## 2023-10-05 DIAGNOSIS — Z79899 Other long term (current) drug therapy: Secondary | ICD-10-CM | POA: Diagnosis not present

## 2023-10-05 DIAGNOSIS — R112 Nausea with vomiting, unspecified: Secondary | ICD-10-CM | POA: Diagnosis not present

## 2023-10-05 DIAGNOSIS — T451X5A Adverse effect of antineoplastic and immunosuppressive drugs, initial encounter: Secondary | ICD-10-CM | POA: Diagnosis not present

## 2023-10-05 DIAGNOSIS — C9000 Multiple myeloma not having achieved remission: Secondary | ICD-10-CM

## 2023-10-05 DIAGNOSIS — C9001 Multiple myeloma in remission: Secondary | ICD-10-CM | POA: Diagnosis not present

## 2023-10-05 DIAGNOSIS — Z9484 Stem cells transplant status: Secondary | ICD-10-CM | POA: Diagnosis not present

## 2023-10-05 DIAGNOSIS — C9002 Multiple myeloma in relapse: Secondary | ICD-10-CM | POA: Diagnosis not present

## 2023-10-05 MED ORDER — BORTEZOMIB CHEMO SQ INJECTION 3.5 MG (2.5MG/ML)
1.3000 mg/m2 | Freq: Once | INTRAMUSCULAR | Status: AC
  Administered 2023-10-05: 2.25 mg via SUBCUTANEOUS
  Filled 2023-10-05: qty 0.9

## 2023-10-05 MED ORDER — DEXAMETHASONE 4 MG PO TABS
20.0000 mg | ORAL_TABLET | Freq: Once | ORAL | Status: AC
  Administered 2023-10-05: 20 mg via ORAL
  Filled 2023-10-05: qty 5

## 2023-10-05 NOTE — Patient Instructions (Signed)
 [facility] [place] - A DEPT OF [facility]  Discharge Instructions: Thank you for choosing [facility] to provide your oncology and hematology care.  If you have a lab appointment with the Cancer Center - please note that after April 8th, 2024, all labs will be drawn in the cancer center.  You do not have to check in or register with the main entrance as you have in the past but will complete your check-in in the cancer center.  Wear comfortable clothing and clothing appropriate for easy access to any Portacath or PICC line.   We strive to give you quality time with your provider. You may need to reschedule your appointment if you arrive late (15 or more minutes).  Arriving late affects you and other patients whose appointments are after yours.  Also, if you miss three or more appointments without notifying the office, you may be dismissed from the clinic at the provider's discretion.      For prescription refill requests, have your pharmacy contact our office and allow 72 hours for refills to be completed.    Today you received the following chemotherapy and/or immunotherapy agents velcade     To help prevent nausea and vomiting after your treatment, we encourage you to take your nausea medication as directed.  BELOW ARE SYMPTOMS THAT SHOULD BE REPORTED IMMEDIATELY: *FEVER GREATER THAN 100.4 F (38 C) OR HIGHER *CHILLS OR SWEATING *NAUSEA AND VOMITING THAT IS NOT CONTROLLED WITH YOUR NAUSEA MEDICATION *UNUSUAL SHORTNESS OF BREATH *UNUSUAL BRUISING OR BLEEDING *URINARY PROBLEMS (pain or burning when urinating, or frequent urination) *BOWEL PROBLEMS (unusual diarrhea, constipation, pain near the anus) TENDERNESS IN MOUTH AND THROAT WITH OR WITHOUT PRESENCE OF ULCERS (sore throat, sores in mouth, or a toothache) UNUSUAL RASH, SWELLING OR PAIN  UNUSUAL VAGINAL DISCHARGE OR ITCHING   Items with * indicate a potential emergency and should be followed up as  soon as possible or go to the Emergency Department if any problems should occur.  Please show the CHEMOTHERAPY ALERT CARD or IMMUNOTHERAPY ALERT CARD at check-in to the Emergency Department and triage nurse.  Should you have questions after your visit or need to cancel or reschedule your appointment, please contact [facility] CANCER CTR [place] - A DEPT OF [facility] [phone]  and follow the prompts.  Office hours are 8:00 a.m. to 4:30 p.m. Monday - Friday. Please note that voicemails left after 4:00 p.m. may not be returned until the following business day.  We are closed weekends and major holidays. You have access to a nurse at all times for urgent questions. Please call the main number to the clinic [phone] and follow the prompts.  For any non-urgent questions, you may also contact your provider using MyChart. We now offer e-Visits for anyone 62 and older to request care online for non-urgent symptoms. For details visit [email or web address].   Also download the MyChart app! Go to the app store, search "MyChart", open the app, select [place], and log in with your MyChart username and password.

## 2023-10-05 NOTE — Progress Notes (Signed)
 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 Walker? Wheelchair?: {VQI Ambulatory Status:20974}  SAFETY ISSUES: Prior radiation? {:18581} Pacemaker/ICD? {:18581} Possible current pregnancy? {:18581} Is the patient on methotrexate? {:18581}  Current Complaints / other details:  ***

## 2023-10-05 NOTE — Progress Notes (Signed)
 Patient tolerated Velcade injection with no complaints voiced.  Lab work reviewed.  See MAR for details.  Injection site clean and dry with no bruising or swelling noted.  Patient stable during and after injection.  Band aid applied.  VSS.  Patient left in satisfactory condition with no s/s of distress noted.

## 2023-10-06 ENCOUNTER — Other Ambulatory Visit: Payer: Self-pay

## 2023-10-06 DIAGNOSIS — C9000 Multiple myeloma not having achieved remission: Secondary | ICD-10-CM

## 2023-10-06 LAB — MISC LABCORP TEST (SEND OUT): Labcorp test code: 452312

## 2023-10-09 ENCOUNTER — Inpatient Hospital Stay

## 2023-10-09 ENCOUNTER — Inpatient Hospital Stay: Admitting: Hematology

## 2023-10-09 ENCOUNTER — Other Ambulatory Visit: Payer: Self-pay

## 2023-10-09 VITALS — BP 146/78 | HR 90 | Temp 98.1°F | Resp 20 | Wt 157.2 lb

## 2023-10-09 VITALS — BP 139/76 | HR 90 | Temp 97.0°F | Resp 18

## 2023-10-09 DIAGNOSIS — C9000 Multiple myeloma not having achieved remission: Secondary | ICD-10-CM

## 2023-10-09 DIAGNOSIS — Z5112 Encounter for antineoplastic immunotherapy: Secondary | ICD-10-CM | POA: Diagnosis not present

## 2023-10-09 DIAGNOSIS — Z79899 Other long term (current) drug therapy: Secondary | ICD-10-CM | POA: Diagnosis not present

## 2023-10-09 DIAGNOSIS — C9001 Multiple myeloma in remission: Secondary | ICD-10-CM

## 2023-10-09 LAB — COMPREHENSIVE METABOLIC PANEL WITH GFR
ALT: 35 U/L (ref 0–44)
AST: 21 U/L (ref 15–41)
Albumin: 3.4 g/dL — ABNORMAL LOW (ref 3.5–5.0)
Alkaline Phosphatase: 74 U/L (ref 38–126)
Anion gap: 16 — ABNORMAL HIGH (ref 5–15)
BUN: 19 mg/dL (ref 8–23)
CO2: 20 mmol/L — ABNORMAL LOW (ref 22–32)
Calcium: 8.7 mg/dL — ABNORMAL LOW (ref 8.9–10.3)
Chloride: 95 mmol/L — ABNORMAL LOW (ref 98–111)
Creatinine, Ser: 0.79 mg/dL (ref 0.44–1.00)
GFR, Estimated: >60 mL/min (ref >60)
Glucose, Bld: 124 mg/dL — ABNORMAL HIGH (ref 70–99)
Potassium: 3.6 mmol/L (ref 3.5–5.1)
Sodium: 131 mmol/L — ABNORMAL LOW (ref 135–145)
Total Bilirubin: 0.5 mg/dL (ref 0.0–1.2)
Total Protein: 8.2 g/dL — ABNORMAL HIGH (ref 6.5–8.1)

## 2023-10-09 LAB — CBC WITH DIFFERENTIAL/PLATELET
Abs Immature Granulocytes: 0.02 K/uL (ref 0.00–0.07)
Basophils Absolute: 0 K/uL (ref 0.0–0.1)
Basophils Relative: 0 %
Eosinophils Absolute: 0 K/uL (ref 0.0–0.5)
Eosinophils Relative: 1 %
HCT: 42.1 % (ref 36.0–46.0)
Hemoglobin: 13.5 g/dL (ref 12.0–15.0)
Immature Granulocytes: 0 %
Lymphocytes Relative: 12 %
Lymphs Abs: 0.6 K/uL — ABNORMAL LOW (ref 0.7–4.0)
MCH: 32.7 pg (ref 26.0–34.0)
MCHC: 32.1 g/dL (ref 30.0–36.0)
MCV: 101.9 fL — ABNORMAL HIGH (ref 80.0–100.0)
Monocytes Absolute: 0.7 K/uL (ref 0.1–1.0)
Monocytes Relative: 14 %
Neutro Abs: 3.5 K/uL (ref 1.7–7.7)
Neutrophils Relative %: 73 %
Platelets: 154 K/uL (ref 150–400)
RBC: 4.13 MIL/uL (ref 3.87–5.11)
RDW: 14.3 % (ref 11.5–15.5)
WBC: 4.8 K/uL (ref 4.0–10.5)
nRBC: 0 % (ref 0.0–0.2)

## 2023-10-09 MED ORDER — DARATUMUMAB-HYALURONIDASE-FIHJ 1800-30000 MG-UT/15ML ~~LOC~~ SOLN
1800.0000 mg | Freq: Once | SUBCUTANEOUS | Status: AC
  Administered 2023-10-09: 1800 mg via SUBCUTANEOUS
  Filled 2023-10-09: qty 15

## 2023-10-09 MED ORDER — DIPHENHYDRAMINE HCL 25 MG PO CAPS
50.0000 mg | ORAL_CAPSULE | Freq: Once | ORAL | Status: AC
Start: 2023-10-09 — End: 2023-10-09
  Administered 2023-10-09: 50 mg via ORAL
  Filled 2023-10-09: qty 2

## 2023-10-09 MED ORDER — ACETAMINOPHEN 325 MG PO TABS
650.0000 mg | ORAL_TABLET | Freq: Once | ORAL | Status: AC
  Administered 2023-10-09: 650 mg via ORAL
  Filled 2023-10-09: qty 2

## 2023-10-09 MED ORDER — DEXAMETHASONE 4 MG PO TABS
20.0000 mg | ORAL_TABLET | Freq: Once | ORAL | Status: AC
Start: 2023-10-09 — End: 2023-10-09
  Administered 2023-10-09: 20 mg via ORAL
  Filled 2023-10-09: qty 5

## 2023-10-09 MED ORDER — BORTEZOMIB CHEMO SQ INJECTION 3.5 MG (2.5MG/ML)
1.0000 mg/m2 | Freq: Once | INTRAMUSCULAR | Status: AC
  Administered 2023-10-09: 1.75 mg via SUBCUTANEOUS
  Filled 2023-10-09: qty 0.7

## 2023-10-09 MED ORDER — MONTELUKAST SODIUM 10 MG PO TABS
10.0000 mg | ORAL_TABLET | Freq: Once | ORAL | Status: AC
Start: 2023-10-09 — End: 2023-10-09
  Administered 2023-10-09: 10 mg via ORAL
  Filled 2023-10-09: qty 1

## 2023-10-09 MED ORDER — SODIUM CHLORIDE 0.9 % IV SOLN: INTRAVENOUS | Status: DC

## 2023-10-09 NOTE — Patient Instructions (Signed)
 [facility] at [facility] Discharge Instructions   You were seen and examined today by Dr. Rogers.  He reviewed the results of your lab work which are normal/stable.   He reviewed the results of your MRI that is showing a few spots from the myeloma. Keep your appointment with radiation oncology so you may have these spots treated.   We will proceed with your treatment today.   Return as scheduled.    Thank you for choosing [facility] at [facility] to provide your oncology and hematology care.  To afford each patient quality time with our provider, please arrive at least 15 minutes before your scheduled appointment time.   If you have a lab appointment with the Cancer Center please come in thru the Main Entrance and check in at the main information desk.  You need to re-schedule your appointment should you arrive 10 or more minutes late.  We strive to give you quality time with our providers, and arriving late affects you and other patients whose appointments are after yours.  Also, if you no show three or more times for appointments you may be dismissed from the clinic at the providers discretion.     Again, thank you for choosing [facility].  Our hope is that these requests will decrease the amount of time that you wait before being seen by our physicians.       _____________________________________________________________  Should you have questions after your visit to [facility], please contact our office at [phone] and follow the prompts.  Our office hours are 8:00 a.m. and 4:30 p.m. Monday - Friday.  Please note that voicemails left after 4:00 p.m. may not be returned until the following business day.  We are closed weekends and major holidays.  You do have access to a nurse 24-7, just call the main number to the clinic [phone] and do not press any options, hold on the line and a nurse will answer  the phone.    For prescription refill requests, have your pharmacy contact our office and allow 72 hours.    Due to Covid, you will need to wear a mask upon entering the hospital. If you do not have a mask, a mask will be given to you at the Main Entrance upon arrival. For doctor visits, patients may have 1 support person age 72 or older with them. For treatment visits, patients can not have anyone with them due to social distancing guidelines and our immunocompromised population.

## 2023-10-09 NOTE — Progress Notes (Signed)
 [facility] [address], [place] [address]    Clinic Day:  10/09/2023  Referring physician: Sheryle Carwin, MD  Patient Care Team: Sheryle Carwin, MD as PCP - General (Internal Medicine) Mallipeddi, Diannah SQUIBB, MD as PCP - Cardiology (Cardiology) Rogers Hai, MD as Medical Oncologist (Medical Oncology)   ASSESSMENT & PLAN:   Assessment: 1.  Stage II IgA kappa multiple myeloma, standard risk: -XRT to the spine completed on November 10, 2017 following T9-T10 laminectomy with resection of epidural tumor, T8-T11 posterior fusion by Dr. Louis. -5 cycles of RVD from 11/14/2017 through 02/19/2018 followed by stem cell transplant on April 03, 2018. -Revlimid  maintenance therapy 10 mg 3 weeks on/1 week off started on August 24, 2018. -PET scan on 09/02/2019 showed redemonstrated bilateral cervical, right supraclavicular, right axillary hypermetabolic adenopathy with interval resolution of left axillary adenopathy.  Similar lytic and sclerotic lesions in bones with no corresponding FDG uptake.  Persistent increased FDG uptake in both palatine tonsils. -Reviewed myeloma panel from 09/26/2019.  SPEP and immunofixation is negative.  Free light chain ratio is normal with kappa light chains elevated at 32.5. -Right neck lymph node biopsy on 10/21/2019 at [facility] was negative for malignancy. - PET scan (09/29/2022): Right posterior 10th rib hypermetabolism.  Focal hypermetabolic lesion in the left mid femoral shaft. - XRT to the rib lesion and mid femur lesion, 25 Gray in 10 fractions from 11/03/2022 through 11/16/2022. - Revlimid  dose reduced to 5 mg 3 weeks on/1 week off on 01/31/2023 due to decreased tolerance to 10 mg tablet (decrease in energy, appetite and hair thinning), discontinued on 09/26/2023 due to progression - PET scan (08/24/2023): New lucent bone lesions in the posterior aspect of the left 11th rib and right frontal bone.  New lesions in the right calvarial convexity  and proximal shaft of the left femur. - BMBX (09/13/2023): Hypercellular marrow with 30-40% plasma cells. - FISH panel: T p53 mutation, gain of longer of chromosome 1 (1 q.), monosomy 13 - Cytogenetics: Pending - Dara VD cycle 1 started on 10/02/2023   2.  Myeloma bone disease: -Zometa  was started on 11/21/2017, resumed after transplant on 09/06/2018.    Plan: 1.  Recurrent stage II IgA kappa multiple myeloma, high risk: - She started cycle 1 of Dara Velcade  and dexamethasone  on 10/02/2023. - She reported feeling very weak. - Reviewed MRI of orbits from 09/28/2023: Enhancing mass within the orbital roof which extends into the retroconal orbit and impresses upon the superior rectus muscle.  Separate circumscribed lesion is noted within the sphenoid wing anterolateral to the anterior glenoid. - I have recommended radiation therapy to these 2 lesions.  She is seeing radiation oncology on 10/11/2023. - Reviewed labs today: CBC grossly normal.  LFTs are normal as well.  Creatinine is stable.  CO2 is slightly low with increased anion gap. - I will give her 1 L of normal saline today. - Will dose reduce Velcade  to 1 mg/m as she is feeling weak and her neuropathy is slightly more noticeable. - I will reevaluate her in 2 weeks.   2.  Myeloma bone disease: - She completed Zometa  3 years on 03/10/2022.  As her recent PET scan showed new lesions, we will start her back on Zometa .   3.  Thromboprophylaxis: - She will continue aspirin 81 mg daily.  Continue acyclovir  twice daily.   4.  Hypertension: - Continue amlodipine  and HCTZ.  Blood pressure today is 146/78.   5.  Peripheral neuropathy: - She has mild on and off tingling in the hands and feet which is slightly worse since Velcade  was started back.  Continue gabapentin  300 mg twice daily.  Will dose reducing Velcade .    No orders of the defined types were placed in this encounter.     LILLETTE Verneta SAUNDERS Jamie Ewing,acting as a Neurosurgeon for Alean Stands, MD.,have documented all relevant documentation on the behalf of Alean Stands, MD,as directed by  Alean Stands, MD while in the presence of Alean Stands, MD.  I, Alean Stands MD, have reviewed the above documentation for accuracy and completeness, and I agree with the above.      Alean Stands, MD   7/21/20251:05 PM  CHIEF COMPLAINT:   Diagnosis: multiple myeloma    Cancer Staging  No matching staging information was found for the patient.    Prior Therapy: 1. Radiation through 11/10/2017. 2. RVD x 5 cycles from 11/14/2017 to 02/19/2018. 3. Stem cell transplant on 04/03/2018.  Maintenance Revlimid  until 09/26/2023  Current Therapy: Dara VD   HISTORY OF PRESENT ILLNESS:   Oncology History  Multiple myeloma (HCC)  10/10/2017 Initial Diagnosis   Multiple myeloma (HCC)   11/14/2017 - 02/26/2018 Chemotherapy   The patient had dexamethasone  (DECADRON ) tablet 40 mg, 40 mg (100 % of original dose 40 mg), Oral,  Once, 1 of 1 cycle Dose modification: 40 mg (original dose 40 mg, Cycle 1) Administration: 40 mg (11/14/2017) dexamethasone  (DECADRON ) 4 MG tablet, 1 of 1 cycle, Start date: 11/02/2017, End date: 03/12/2018 lenalidomide  (REVLIMID ) 25 MG capsule, 1 of 1 cycle, Start date: 02/28/2018, End date: 03/12/2018 bortezomib  SQ (VELCADE ) chemo injection 2.5 mg, 1.3 mg/m2 = 2.5 mg, Subcutaneous,  Once, 5 of 6 cycles Administration: 2.5 mg (11/14/2017), 2.5 mg (11/21/2017), 2.5 mg (11/28/2017), 2.5 mg (12/14/2017), 2.5 mg (12/21/2017), 2.5 mg (12/28/2017), 2.5 mg (01/04/2018), 2.5 mg (01/11/2018), 2.5 mg (01/18/2018), 2.5 mg (01/25/2018), 2.5 mg (02/01/2018), 2.5 mg (02/08/2018), 2.5 mg (02/19/2018), 2.5 mg (02/26/2018)  for chemotherapy treatment.    10/02/2023 -  Chemotherapy   Patient is on Treatment Plan : MYELOMA RELAPSED / REFRACTORY Daratumumab  SQ + Bortezomib  + Dexamethasone  (DaraVd) q21d / Daratumumab  SQ q28d         INTERVAL HISTORY:   Jamie Ewing  is a 83 y.o. female presenting to clinic today for follow up of multiple myeloma. She was last seen by me on 09/26/2023.  Since her last visit, she underwent MRI orbits on 09/28/2023 that found: There is a circumscribed avidly enhancing mass within the orbital roof which extends into the retro conal orbit and impresses upon the superior rectus muscle. A separate circumscribed lesion is noted within the sphenoid wing anterolateral to the anterior clinoid. The findings presumably represent multiple myeloma.   Today, she states that she is doing well overall. Her appetite level is at 65%. Her energy level is at 10%. She is accompanied by family members.   PAST MEDICAL HISTORY:   Past Medical History: Past Medical History:  Diagnosis Date   Back pain    Cancer (HCC)    mult myeloma 2019   Hypercholesteremia    Hypertension     Surgical History: Past Surgical History:  Procedure Laterality Date   BONE MARROW BIOPSY     LAMINECTOMY N/A 10/06/2017   Procedure: THORACIC NINE AND TEN LAMINECTOMY WITH RESECTION OF TUMOR, THORACIC EIGHT TO THORACIC ELEVEN FUSION WITH PEDICLE SCREW FIXATION;  Surgeon: Louis Shove, MD;  Location: [facility] OR;  Service: Neurosurgery;  Laterality: N/A;  REPLACEMENT TOTAL KNEE Left 2003   TOTAL KNEE ARTHROPLASTY  2001    Social History: Social History   Socioeconomic History   Marital status: Widowed    Spouse name: Not on file   Number of children: 2   Years of education: Not on file   Highest education level: Not on file  Occupational History   Not on file  Tobacco Use   Smoking status: Former    Current packs/day: 0.00    Average packs/day: 0.5 packs/day for 30.0 years (15.0 ttl pk-yrs)    Types: Cigarettes    Start date: 05/30/1976    Quit date: 05/31/2006    Years since quitting: 17.3   Smokeless tobacco: Never  Vaping Use   Vaping status: Never Used  Substance and Sexual Activity   Alcohol use: Yes    Comment: wine occassionally   Drug use: No    Sexual activity: Not on file  Other Topics Concern   Not on file  Social History Narrative   Not on file   Social Drivers of Health   Financial Resource Strain: Low Risk  (10/19/2017)   Overall Financial Resource Strain (CARDIA)    Difficulty of Paying Living Expenses: Not very hard  Food Insecurity: No Food Insecurity (10/26/2022)   Hunger Vital Sign    Worried About Running Out of Food in the Last Year: Never true    Ran Out of Food in the Last Year: Never true  Transportation Needs: No Transportation Needs (10/26/2022)   PRAPARE - Administrator, Civil Service (Medical): No    Lack of Transportation (Non-Medical): No  Physical Activity: Sufficiently Active (10/19/2017)   Exercise Vital Sign    Days of Exercise per Week: 4 days    Minutes of Exercise per Session: 120 min  Stress: No Stress Concern Present (10/19/2017)   [employer] of Occupational Health - Occupational Stress Questionnaire    Feeling of Stress : Not at all  Social Connections: Moderately Integrated (10/19/2017)   Social Connection and Isolation Panel    Frequency of Communication with Friends and Family: More than three times a week    Frequency of Social Gatherings with Friends and Family: Once a week    Attends Religious Services: More than 4 times per year    Active Member of [employer] or Organizations: Yes    Attends Banker Meetings: More than 4 times per year    Marital Status: Widowed  Intimate Partner Violence: Not At Risk (10/26/2022)   Humiliation, Afraid, Rape, and Kick questionnaire    Fear of Current or Ex-Partner: No    Emotionally Abused: No    Physically Abused: No    Sexually Abused: No    Family History: Family History  Problem Relation Age of Onset   Tuberculosis Mother    Kidney failure Father    Hypertension Paternal Aunt    Diabetes Paternal Aunt    Hypertension Paternal Uncle    Diabetes Paternal Uncle    Hypertension Daughter    Stroke Daughter     Current  Medications:  Current Outpatient Medications:    acetaminophen  (TYLENOL ) 500 MG tablet, Take by mouth., Disp: , Rfl:    acyclovir  (ZOVIRAX ) 400 MG tablet, Take 1 tablet (400 mg total) by mouth 2 (two) times daily., Disp: 60 tablet, Rfl: 3   amLODipine  (NORVASC ) 5 MG tablet, Take 5 mg by mouth daily. , Disp: , Rfl:    calcium carbonate (OSCAL) 1500 (600 Ca) MG  TABS tablet, Take by mouth., Disp: , Rfl:    dexamethasone  (DECADRON ) 4 MG tablet, Take 1 tablet (4 mg total) by mouth once a week. Take 20 mg by mouth weekly the day after darzalex  injection, Disp: 20 tablet, Rfl: 2   diltiazem  (CARDIZEM  SR) 60 MG 12 hr capsule, Take 1 capsule (60 mg total) by mouth 2 (two) times daily., Disp: 180 capsule, Rfl: 2   gabapentin  (NEURONTIN ) 300 MG capsule, Take 1 capsule (300 mg total) by mouth 2 (two) times daily., Disp: 60 capsule, Rfl: 3   hydrochlorothiazide  (MICROZIDE ) 12.5 MG capsule, Take 1 capsule (12.5 mg total) by mouth daily., Disp: 45 capsule, Rfl: 3   lenalidomide  (REVLIMID ) 5 MG capsule, Take 1 capsule (5 mg total) by mouth daily. TAKE 1 CAPSULE BY MOUTH DAILY FOR 21 DAYS, HOLD FOR 7 DAYS, REPEAT EVERY 28 DAYS, Disp: 21 capsule, Rfl: 0   LORazepam (ATIVAN) 0.5 MG tablet, Take 0.25-0.5 mg by mouth 2 (two) times daily as needed for anxiety., Disp: , Rfl:    LOW-DOSE ASPIRIN PO, Asprin Ec Low Dose, Disp: , Rfl:    metoprolol  tartrate (LOPRESSOR ) 25 MG tablet, Take 25 mg by mouth 2 (two) times daily., Disp: , Rfl:    Multiple Vitamins-Minerals (THERA-M) TABS, Thera-M 9 mg iron-400 mcg tablet, Disp: , Rfl:    polyethylene glycol (MIRALAX  / GLYCOLAX ) packet, Take 17 g by mouth as needed. , Disp: , Rfl:    potassium chloride  SA (KLOR-CON  M) 20 MEQ tablet, TAKE 1 TABLET TWICE DAILY, Disp: 180 tablet, Rfl: 3   prochlorperazine  (COMPAZINE ) 10 MG tablet, Take 1 tablet (10 mg total) by mouth every 6 (six) hours as needed., Disp: 60 tablet, Rfl: 2   vitamin B-12 (CYANOCOBALAMIN ) 1000 MCG tablet, Vitamin B12,  Disp: , Rfl:  No current facility-administered medications for this visit.  Facility-Administered Medications Ordered in Other Visits:    0.9 %  sodium chloride  infusion, , Intravenous, Continuous, Rogers Hai, MD, Last Rate: 500 mL/hr at 10/09/23 1145, New Bag at 10/09/23 1145   Allergies: No Known Allergies  REVIEW OF SYSTEMS:   Review of Systems  Constitutional:  Negative for chills, fatigue and fever.  HENT:   Negative for lump/mass, mouth sores, nosebleeds, sore throat and trouble swallowing.   Eyes:  Negative for eye problems.  Respiratory:  Positive for shortness of breath (On exertion). Negative for cough.   Cardiovascular:  Negative for chest pain, leg swelling and palpitations.  Gastrointestinal:  Positive for diarrhea and nausea. Negative for abdominal pain, constipation and vomiting.  Genitourinary:  Negative for bladder incontinence, difficulty urinating, dysuria, frequency, hematuria and nocturia.   Musculoskeletal:  Negative for arthralgias, back pain, flank pain, myalgias and neck pain.  Skin:  Negative for itching and rash.  Neurological:  Positive for dizziness and numbness. Negative for headaches.  Hematological:  Does not bruise/bleed easily.  Psychiatric/Behavioral:  Negative for depression, sleep disturbance and suicidal ideas. The patient is not nervous/anxious.   All other systems reviewed and are negative.    VITALS:   Blood pressure (!) 146/78, pulse 90, temperature 98.1 F (36.7 C), temperature source Tympanic, resp. rate 20, weight 157 lb 3 oz (71.3 kg), SpO2 100%.  Wt Readings from Last 3 Encounters:  10/09/23 157 lb 3 oz (71.3 kg)  10/02/23 159 lb (72.1 kg)  09/26/23 153 lb (69.4 kg)    Body mass index is 27.84 kg/m.  Performance status (ECOG): 1 - Symptomatic but completely ambulatory  PHYSICAL EXAM:   Physical  Exam Vitals and nursing note reviewed. Exam conducted with a chaperone present.  Constitutional:      Appearance: Normal  appearance.  Cardiovascular:     Rate and Rhythm: Normal rate and regular rhythm.     Pulses: Normal pulses.     Heart sounds: Normal heart sounds.  Pulmonary:     Effort: Pulmonary effort is normal.     Breath sounds: Normal breath sounds.  Abdominal:     Palpations: Abdomen is soft. There is no hepatomegaly, splenomegaly or mass.     Tenderness: There is no abdominal tenderness.  Musculoskeletal:     Right lower leg: No edema.     Left lower leg: No edema.  Lymphadenopathy:     Cervical: No cervical adenopathy.     Right cervical: No superficial, deep or posterior cervical adenopathy.    Left cervical: No superficial, deep or posterior cervical adenopathy.     Upper Body:     Right upper body: No supraclavicular or axillary adenopathy.     Left upper body: No supraclavicular or axillary adenopathy.  Neurological:     General: No focal deficit present.     Mental Status: She is alert and oriented to person, place, and time.  Psychiatric:        Mood and Affect: Mood normal.        Behavior: Behavior normal.     LABS:   CBC     Component Value Date/Time   WBC 4.8 10/09/2023 0944   RBC 4.13 10/09/2023 0944   HGB 13.5 10/09/2023 0944   HCT 42.1 10/09/2023 0944   PLT 154 10/09/2023 0944   MCV 101.9 (H) 10/09/2023 0944   MCH 32.7 10/09/2023 0944   MCHC 32.1 10/09/2023 0944   RDW 14.3 10/09/2023 0944   LYMPHSABS 0.6 (L) 10/09/2023 0944   MONOABS 0.7 10/09/2023 0944   EOSABS 0.0 10/09/2023 0944   BASOSABS 0.0 10/09/2023 0944    CMP      Component Value Date/Time   NA 131 (L) 10/09/2023 0944   K 3.6 10/09/2023 0944   CL 95 (L) 10/09/2023 0944   CO2 20 (L) 10/09/2023 0944   GLUCOSE 124 (H) 10/09/2023 0944   BUN 19 10/09/2023 0944   CREATININE 0.79 10/09/2023 0944   CALCIUM 8.7 (L) 10/09/2023 0944   PROT 8.2 (H) 10/09/2023 0944   ALBUMIN  3.4 (L) 10/09/2023 0944   AST 21 10/09/2023 0944   ALT 35 10/09/2023 0944   ALKPHOS 74 10/09/2023 0944   BILITOT 0.5  10/09/2023 0944   GFRNONAA >60 10/09/2023 0944   GFRAA >60 11/14/2019 1148     No results found for: CEA1, CEA / No results found for: CEA1, CEA No results found for: PSA1 No results found for: CAN199 No results found for: RJW874  Lab Results  Component Value Date   TOTALPROTELP 8.1 08/07/2023   TOTALPROTELP 8.1 08/07/2023   ALBUMINELP 3.5 08/07/2023   A1GS 0.3 08/07/2023   A2GS 0.9 08/07/2023   BETS 2.2 (H) 08/07/2023   GAMS 1.2 08/07/2023   MSPIKE 0.9 (H) 08/07/2023   SPEI Comment 08/07/2023   Lab Results  Component Value Date   TIBC 334 07/03/2023   TIBC 367 01/24/2023   TIBC 186 (L) 11/14/2017   FERRITIN 149 07/03/2023   FERRITIN 151 01/24/2023   FERRITIN 455 (H) 11/14/2017   IRONPCTSAT 24 07/03/2023   IRONPCTSAT 20 01/24/2023   IRONPCTSAT 20 11/14/2017   Lab Results  Component Value Date   LDH 109  03/03/2022   LDH 112 12/09/2021   LDH 100 09/08/2021     STUDIES:   MR ORBITS W WO CONTRAST Result Date: 09/28/2023 CLINICAL DATA:  vision changes; right eye drooping EXAM: MRI OF THE ORBITS WITHOUT AND WITH CONTRAST TECHNIQUE: Multiplanar, multi-echo pulse sequences of the orbits and surrounding structures were acquired including fat saturation techniques, before and after intravenous contrast administration. CONTRAST:  7mL GADAVIST  GADOBUTROL  1 MMOL/ML IV SOLN COMPARISON:  MRI of the head dated October 06, 2022. FINDINGS: Orbits: There is an expansile, avidly enhancing mass present within the right orbital roof measuring approximately 17 mm in AP diameter, 29 mm in transverse diameter and 20 mm in craniocaudad length. The lesion protrudes into the retrobulbar right orbit and impresses upon the superior rectus muscle. It extends medially within the orbital roof and extends in the lateral aspect of the frontal sinus and superomedial orbital wall. There is also a separate circumscribed lesion within the greater sphenoid wing anterolateral to the anterior clinoid  process. The patient is status post bilateral lens replacement. The optic nerves are normal in morphology and signal intensity. The optic sheaths are nondilated. The extra-axial air muscles are normal in caliber. There is no mass effect upon the optic nerves or optic chiasm. Visualized sinuses: The right frontal sinus is partially opacified. The paranasal sinuses are clear otherwise. Soft tissues: The periorbital soft tissues are unremarkable. Limited intracranial: Mild periventricular white matter disease. Normal appearance of the brain otherwise. IMPRESSION: 1. There is a circumscribed avidly enhancing mass within the orbital roof which extends into the retro conal orbit and impresses upon the superior rectus muscle. A separate circumscribed lesion is noted within the sphenoid wing anterolateral to the anterior clinoid. The findings presumably represent multiple myeloma. Electronically Signed   By: Evalene Coho M.D.   On: 09/28/2023 15:25   CT BONE MARROW BIOPSY & ASPIRATION Result Date: 09/12/2023 INDICATION: 83 year old female with history of multiple myeloma. EXAM: CT-GUIDED BONE MARROW BIOPSY AND ASPIRATION MEDICATIONS: None ANESTHESIA/SEDATION: Fentanyl  50 mcg IV; Versed  2 mg IV Sedation Time: 7 minutes; The patient was continuously monitored during the procedure by the interventional radiology nurse under my direct supervision. COMPLICATIONS: None immediate. PROCEDURE: Informed consent was obtained from the patient following an explanation of the procedure, risks, benefits and alternatives. The patient understands, agrees and consents for the procedure. All questions were addressed. A time out was performed prior to the initiation of the procedure. The patient was positioned prone and non-contrast localization CT was performed of the pelvis to demonstrate the iliac marrow spaces. The operative site was prepped and draped in the usual sterile fashion. Under sterile conditions and local anesthesia, a  22 gauge spinal needle was utilized for procedural planning. Next, an 11 gauge coaxial bone biopsy needle was advanced into the right iliac marrow space. Needle position was confirmed with CT imaging. Initially, a bone marrow aspiration was performed. Next, a bone marrow biopsy was obtained with the 11 gauge outer bone marrow device. The needle was removed and superficial hemostasis was obtained with manual compression. A dressing was applied. The patient tolerated the procedure well without immediate post procedural complication. IMPRESSION: Successful CT guided right iliac bone marrow aspiration and core biopsy. Ester Sides, MD [facility] Electronically Signed   By: Ester Sides M.D.   On: 09/12/2023 13:05

## 2023-10-09 NOTE — Progress Notes (Signed)
 Labs reviewed with MD today. OK to treat per MD. Will decrease velcade  dose per MD. And will give one liter of fluids over 2 hours per MD.

## 2023-10-09 NOTE — Progress Notes (Signed)
 Patient tolerated Daratumumab  and Velcade  injection with no complaints voiced.  See MAR for details.  Labs reviewed. Injection site clean and dry with no bruising or swelling noted at site.  Band aid applied.  Vss with discharge and left in satisfactory condition with no s/s of distress noted.   Patient tolerated hydration with no complaints voiced.  Port site clean and dry with good blood return noted before and after hydration.  No bruising or swelling noted with port.  Band aid applied.  VSS with discharge and left ambulatory with no s/s of distress noted.

## 2023-10-09 NOTE — Patient Instructions (Signed)
 [facility] [place] - A DEPT OF [place]  Discharge Instructions: Thank you for choosing [facility] to provide your oncology and hematology care.  If you have a lab appointment with the Cancer Center - please note that after April 8th, 2024, all labs will be drawn in the cancer center.  You do not have to check in or register with the main entrance as you have in the past but will complete your check-in in the cancer center.  Wear comfortable clothing and clothing appropriate for easy access to any Portacath or PICC line.   We strive to give you quality time with your provider. You may need to reschedule your appointment if you arrive late (15 or more minutes).  Arriving late affects you and other patients whose appointments are after yours.  Also, if you miss three or more appointments without notifying the office, you may be dismissed from the clinic at the provider's discretion.      For prescription refill requests, have your pharmacy contact our office and allow 72 hours for refills to be completed.    Today you received the following chemotherapy and/or immunotherapy agents velcade  and Darzalex .       To help prevent nausea and vomiting after your treatment, we encourage you to take your nausea medication as directed.  BELOW ARE SYMPTOMS THAT SHOULD BE REPORTED IMMEDIATELY: *FEVER GREATER THAN 100.4 F (38 C) OR HIGHER *CHILLS OR SWEATING *NAUSEA AND VOMITING THAT IS NOT CONTROLLED WITH YOUR NAUSEA MEDICATION *UNUSUAL SHORTNESS OF BREATH *UNUSUAL BRUISING OR BLEEDING *URINARY PROBLEMS (pain or burning when urinating, or frequent urination) *BOWEL PROBLEMS (unusual diarrhea, constipation, pain near the anus) TENDERNESS IN MOUTH AND THROAT WITH OR WITHOUT PRESENCE OF ULCERS (sore throat, sores in mouth, or a toothache) UNUSUAL RASH, SWELLING OR PAIN  UNUSUAL VAGINAL DISCHARGE OR ITCHING   Items with * indicate a potential emergency and should  be followed up as soon as possible or go to the Emergency Department if any problems should occur.  Please show the CHEMOTHERAPY ALERT CARD or IMMUNOTHERAPY ALERT CARD at check-in to the Emergency Department and triage nurse.  Should you have questions after your visit or need to cancel or reschedule your appointment, please contact [facility] [place] - A DEPT OF [facility] [phone]  and follow the prompts.  Office hours are 8:00 a.m. to 4:30 p.m. Monday - Friday. Please note that voicemails left after 4:00 p.m. may not be returned until the following business day.  We are closed weekends and major holidays. You have access to a nurse at all times for urgent questions. Please call the main number to the clinic [phone] and follow the prompts.  For any non-urgent questions, you may also contact your provider using MyChart. We now offer e-Visits for anyone 69 and older to request care online for non-urgent symptoms. For details visit [email or web address].   Also download the MyChart app! Go to the app store, search MyChart, open the app, select [place], and log in with your MyChart username and password.

## 2023-10-11 ENCOUNTER — Ambulatory Visit
Admission: RE | Admit: 2023-10-11 | Discharge: 2023-10-11 | Disposition: A | Discharge status: Home / Self Care | Source: Ambulatory Visit | Attending: Radiation Oncology | Admitting: Radiation Oncology

## 2023-10-11 ENCOUNTER — Encounter: Payer: Self-pay | Admitting: Radiation Oncology

## 2023-10-11 ENCOUNTER — Encounter: Payer: Self-pay | Admitting: Hematology

## 2023-10-11 VITALS — BP 121/82 | HR 93 | Temp 97.2°F | Resp 20 | Ht 63.0 in | Wt 157.6 lb

## 2023-10-11 DIAGNOSIS — C9000 Multiple myeloma not having achieved remission: Secondary | ICD-10-CM | POA: Insufficient documentation

## 2023-10-11 DIAGNOSIS — Z923 Personal history of irradiation: Secondary | ICD-10-CM | POA: Diagnosis not present

## 2023-10-11 DIAGNOSIS — M549 Dorsalgia, unspecified: Secondary | ICD-10-CM | POA: Insufficient documentation

## 2023-10-11 DIAGNOSIS — Z7952 Long term (current) use of systemic steroids: Secondary | ICD-10-CM | POA: Diagnosis not present

## 2023-10-11 DIAGNOSIS — Z79899 Other long term (current) drug therapy: Secondary | ICD-10-CM | POA: Insufficient documentation

## 2023-10-11 DIAGNOSIS — Z87891 Personal history of nicotine dependence: Secondary | ICD-10-CM | POA: Insufficient documentation

## 2023-10-11 DIAGNOSIS — Z79624 Long term (current) use of inhibitors of nucleotide synthesis: Secondary | ICD-10-CM | POA: Diagnosis not present

## 2023-10-11 DIAGNOSIS — C9002 Multiple myeloma in relapse: Secondary | ICD-10-CM | POA: Diagnosis not present

## 2023-10-11 DIAGNOSIS — R9082 White matter disease, unspecified: Secondary | ICD-10-CM | POA: Insufficient documentation

## 2023-10-11 DIAGNOSIS — I10 Essential (primary) hypertension: Secondary | ICD-10-CM | POA: Diagnosis not present

## 2023-10-11 DIAGNOSIS — E7800 Pure hypercholesterolemia, unspecified: Secondary | ICD-10-CM | POA: Diagnosis not present

## 2023-10-11 DIAGNOSIS — Z510 Encounter for antineoplastic radiation therapy: Secondary | ICD-10-CM | POA: Insufficient documentation

## 2023-10-11 DIAGNOSIS — Z7961 Long term (current) use of immunomodulator: Secondary | ICD-10-CM | POA: Diagnosis not present

## 2023-10-11 DIAGNOSIS — C7951 Secondary malignant neoplasm of bone: Secondary | ICD-10-CM | POA: Diagnosis not present

## 2023-10-11 HISTORY — DX: Multiple myeloma in relapse: C90.02

## 2023-10-11 NOTE — Progress Notes (Incomplete)
 Radiation Oncology         [phone] ________________________________  Initial Out patient Re-Consultation  Name: Jamie Ewing MRN: [id number]  Date: 10/11/2023  DOB: [birth year]  RR:Qjhjw, Gaither, MD  Rogers Hai, MD   REFERRING PHYSICIAN: Rogers Hai, MD  DIAGNOSIS: There were no encounter diagnoses.  {diagnosis}  HISTORY OF PRESENT ILLNESS::Jamie Ewing is a 83 y.o. female who is accompanied by ***. she is seen as a courtesy of *** for an opinion concerning radiation therapy as part of management for her recently diagnosed ***. The patient presented to *** on *** with ***.  {include any relevant scans, other doctor referrals, or other info here}  The patient underwent a biopsy on {date} showing: ***  PREVIOUS RADIATION THERAPY: {EXAM; YES/NO:19492::No}  PAST MEDICAL HISTORY:  Past Medical History:  Diagnosis Date  . Back pain   . Cancer (HCC)    mult myeloma 2019  . Hypercholesteremia   . Hypertension     PAST SURGICAL HISTORY: Past Surgical History:  Procedure Laterality Date  . BONE MARROW BIOPSY    . LAMINECTOMY N/A 10/06/2017   Procedure: THORACIC NINE AND TEN LAMINECTOMY WITH RESECTION OF TUMOR, THORACIC EIGHT TO THORACIC ELEVEN FUSION WITH PEDICLE SCREW FIXATION;  Surgeon: Louis Shove, MD;  Location: [facility] OR;  Service: Neurosurgery;  Laterality: N/A;  . REPLACEMENT TOTAL KNEE Left 2003  . TOTAL KNEE ARTHROPLASTY  2001    FAMILY HISTORY:  Family History  Problem Relation Age of Onset  . Tuberculosis Mother   . Kidney failure Father   . Hypertension Paternal Aunt   . Diabetes Paternal Aunt   . Hypertension Paternal Uncle   . Diabetes Paternal Uncle   . Hypertension Daughter   . Stroke Daughter     SOCIAL HISTORY:  Social History   Tobacco Use  . Smoking status: Former    Current packs/day: 0.00    Average packs/day: 0.5 packs/day for 30.0 years (15.0 ttl pk-yrs)    Types: Cigarettes    Start date: 05/30/1976    Quit date:  05/31/2006    Years since quitting: 17.3  . Smokeless tobacco: Never  Vaping Use  . Vaping status: Never Used  Substance Use Topics  . Alcohol use: Yes    Comment: wine occassionally  . Drug use: No    ALLERGIES: No Known Allergies  MEDICATIONS:  Current Outpatient Medications  Medication Sig Dispense Refill  . acetaminophen  (TYLENOL ) 500 MG tablet Take by mouth.    . acyclovir  (ZOVIRAX ) 400 MG tablet Take 1 tablet (400 mg total) by mouth 2 (two) times daily. 60 tablet 3  . amLODipine  (NORVASC ) 5 MG tablet Take 5 mg by mouth daily.     . calcium carbonate (OSCAL) 1500 (600 Ca) MG TABS tablet Take by mouth.    . dexamethasone  (DECADRON ) 4 MG tablet Take 1 tablet (4 mg total) by mouth once a week. Take 20 mg by mouth weekly the day after darzalex  injection 20 tablet 2  . diltiazem  (CARDIZEM  SR) 60 MG 12 hr capsule Take 1 capsule (60 mg total) by mouth 2 (two) times daily. 180 capsule 2  . gabapentin  (NEURONTIN ) 300 MG capsule Take 1 capsule (300 mg total) by mouth 2 (two) times daily. 60 capsule 3  . hydrochlorothiazide  (MICROZIDE ) 12.5 MG capsule Take 1 capsule (12.5 mg total) by mouth daily. 45 capsule 3  . lenalidomide  (REVLIMID ) 5 MG capsule Take 1 capsule (5 mg total) by mouth daily. TAKE 1 CAPSULE BY MOUTH DAILY  FOR 21 DAYS, HOLD FOR 7 DAYS, REPEAT EVERY 28 DAYS 21 capsule 0  . LORazepam (ATIVAN) 0.5 MG tablet Take 0.25-0.5 mg by mouth 2 (two) times daily as needed for anxiety.    . LOW-DOSE ASPIRIN PO Asprin Ec Low Dose    . metoprolol  tartrate (LOPRESSOR ) 25 MG tablet Take 25 mg by mouth 2 (two) times daily.    . Multiple Vitamins-Minerals (THERA-M) TABS Thera-M 9 mg iron-400 mcg tablet    . polyethylene glycol (MIRALAX  / GLYCOLAX ) packet Take 17 g by mouth as needed.     . potassium chloride  SA (KLOR-CON  M) 20 MEQ tablet TAKE 1 TABLET TWICE DAILY 180 tablet 3  . prochlorperazine  (COMPAZINE ) 10 MG tablet Take 1 tablet (10 mg total) by mouth every 6 (six) hours as needed. 60 tablet  2  . vitamin B-12 (CYANOCOBALAMIN ) 1000 MCG tablet Vitamin B12     No current facility-administered medications for this encounter.    REVIEW OF SYSTEMS:  A 10+ POINT REVIEW OF SYSTEMS WAS OBTAINED including neurology, dermatology, psychiatry, cardiac, respiratory, lymph, extremities, GI, GU, musculoskeletal, constitutional, reproductive, HEENT. ***   PHYSICAL EXAM:  vitals were not taken for this visit.   General: Alert and oriented, in no acute distress HEENT: Head is normocephalic. Extraocular movements are intact. Oropharynx is clear. Neck: Neck is supple, no palpable cervical or supraclavicular lymphadenopathy. Heart: Regular in rate and rhythm with no murmurs, rubs, or gallops. Chest: Clear to auscultation bilaterally, with no rhonchi, wheezes, or rales. Abdomen: Soft, nontender, nondistended, with no rigidity or guarding. Extremities: No cyanosis or edema. Lymphatics: see Neck Exam Skin: No concerning lesions. Musculoskeletal: symmetric strength and muscle tone throughout. Neurologic: Cranial nerves II through XII are grossly intact. No obvious focalities. Speech is fluent. Coordination is intact. Psychiatric: Judgment and insight are intact. Affect is appropriate. ***  ECOG = ***  0 - Asymptomatic (Fully active, able to carry on all predisease activities without restriction)  1 - Symptomatic but completely ambulatory (Restricted in physically strenuous activity but ambulatory and able to carry out work of a light or sedentary nature. For example, light housework, office work)  2 - Symptomatic, <50% in bed during the day (Ambulatory and capable of all self care but unable to carry out any work activities. Up and about more than 50% of waking hours)  3 - Symptomatic, >50% in bed, but not bedbound (Capable of only limited self-care, confined to bed or chair 50% or more of waking hours)  4 - Bedbound (Completely disabled. Cannot carry on any self-care. Totally confined to bed or  chair)  5 - Death   Raylene MM, Creech RH, Tormey DC, et al. [phone]). Toxicity and response criteria of the [facility]. Am. DOROTHA Bridges. Oncol. 5 (6): 649-55  LABORATORY DATA:  Lab Results  Component Value Date   WBC 4.8 10/09/2023   HGB 13.5 10/09/2023   HCT 42.1 10/09/2023   MCV 101.9 (H) 10/09/2023   PLT 154 10/09/2023   NEUTROABS 3.5 10/09/2023   Lab Results  Component Value Date   NA 131 (L) 10/09/2023   K 3.6 10/09/2023   CL 95 (L) 10/09/2023   CO2 20 (L) 10/09/2023   GLUCOSE 124 (H) 10/09/2023   BUN 19 10/09/2023   CREATININE 0.79 10/09/2023   CALCIUM 8.7 (L) 10/09/2023      RADIOGRAPHY: MR ORBITS W WO CONTRAST Result Date: 09/28/2023 CLINICAL DATA:  vision changes; right eye drooping EXAM: MRI OF THE ORBITS WITHOUT AND WITH  CONTRAST TECHNIQUE: Multiplanar, multi-echo pulse sequences of the orbits and surrounding structures were acquired including fat saturation techniques, before and after intravenous contrast administration. CONTRAST:  7mL GADAVIST  GADOBUTROL  1 MMOL/ML IV SOLN COMPARISON:  MRI of the head dated October 06, 2022. FINDINGS: Orbits: There is an expansile, avidly enhancing mass present within the right orbital roof measuring approximately 17 mm in AP diameter, 29 mm in transverse diameter and 20 mm in craniocaudad length. The lesion protrudes into the retrobulbar right orbit and impresses upon the superior rectus muscle. It extends medially within the orbital roof and extends in the lateral aspect of the frontal sinus and superomedial orbital wall. There is also a separate circumscribed lesion within the greater sphenoid wing anterolateral to the anterior clinoid process. The patient is status post bilateral lens replacement. The optic nerves are normal in morphology and signal intensity. The optic sheaths are nondilated. The extra-axial air muscles are normal in caliber. There is no mass effect upon the optic nerves or optic chiasm. Visualized  sinuses: The right frontal sinus is partially opacified. The paranasal sinuses are clear otherwise. Soft tissues: The periorbital soft tissues are unremarkable. Limited intracranial: Mild periventricular white matter disease. Normal appearance of the brain otherwise. IMPRESSION: 1. There is a circumscribed avidly enhancing mass within the orbital roof which extends into the retro conal orbit and impresses upon the superior rectus muscle. A separate circumscribed lesion is noted within the sphenoid wing anterolateral to the anterior clinoid. The findings presumably represent multiple myeloma. Electronically Signed   By: Evalene Coho M.D.   On: 09/28/2023 15:25   CT BONE MARROW BIOPSY & ASPIRATION Result Date: 09/12/2023 INDICATION: 83 year old female with history of multiple myeloma. EXAM: CT-GUIDED BONE MARROW BIOPSY AND ASPIRATION MEDICATIONS: None ANESTHESIA/SEDATION: Fentanyl  50 mcg IV; Versed  2 mg IV Sedation Time: 7 minutes; The patient was continuously monitored during the procedure by the interventional radiology nurse under my direct supervision. COMPLICATIONS: None immediate. PROCEDURE: Informed consent was obtained from the patient following an explanation of the procedure, risks, benefits and alternatives. The patient understands, agrees and consents for the procedure. All questions were addressed. A time out was performed prior to the initiation of the procedure. The patient was positioned prone and non-contrast localization CT was performed of the pelvis to demonstrate the iliac marrow spaces. The operative site was prepped and draped in the usual sterile fashion. Under sterile conditions and local anesthesia, a 22 gauge spinal needle was utilized for procedural planning. Next, an 11 gauge coaxial bone biopsy needle was advanced into the right iliac marrow space. Needle position was confirmed with CT imaging. Initially, a bone marrow aspiration was performed. Next, a bone marrow biopsy was  obtained with the 11 gauge outer bone marrow device. The needle was removed and superficial hemostasis was obtained with manual compression. A dressing was applied. The patient tolerated the procedure well without immediate post procedural complication. IMPRESSION: Successful CT guided right iliac bone marrow aspiration and core biopsy. Ester Sides, MD [facility] Electronically Signed   By: Ester Sides M.D.   On: 09/12/2023 13:05      IMPRESSION: {diagnosis}  ***  Today, I talked to the patient and family about the findings and work-up thus far.  We discussed the natural history of *** and general treatment, highlighting the role of radiotherapy in the management.  We discussed the available radiation techniques, and focused on the details of logistics and delivery.  We reviewed the anticipated acute and  late sequelae associated with radiation in this setting.  The patient was encouraged to ask questions that I answered to the best of my ability. *** A patient consent form was discussed and signed.  We retained a copy for our records.  The patient would like to proceed with radiation and will be scheduled for CT simulation.  PLAN: ***    *** minutes of total time was spent for this patient encounter, including preparation, face-to-face counseling with the patient and coordination of care, physical exam, and documentation of the encounter.   ------------------------------------------------  Lynwood CHARM Nasuti, PhD, MD  This document serves as a record of services personally performed by Lynwood Nasuti, MD. It was created on his behalf by Dorthy Fuse, a trained medical scribe. The creation of this record is based on the scribe's personal observations and the provider's statements to them. This document has been checked and approved by the attending provider.

## 2023-10-11 NOTE — Progress Notes (Signed)
 Radiation Oncology         [phone] ________________________________  Initial Out patient Re-Consultation  Name: Jamie Ewing MRN: [id number]  Date: 10/11/2023  DOB: 1941/01/10  RR:Qjhjw, Gaither, MD  Rogers Hai, MD   REFERRING PHYSICIAN: Rogers Hai, MD  DIAGNOSIS: The primary encounter diagnosis was Kappa light chain myeloma (HCC). Diagnoses of Secondary malignant neoplasm of bone (HCC) and Multiple myeloma in relapse [facility]) were also pertinent to this visit.   Stage II IgA kappa multiple myeloma, now with a new orbital roof mass  HISTORY OF PRESENT ILLNESS::Jamie Ewing is a 83 y.o. female who is accompanied by her supportive goddaughter. she is seen as a courtesy of Dr. Katragadda for an opinion concerning radiation therapy as part of management for her recurrent myeloma. She was last seen in office on 12/26/22 for a follow up visit.   The patient was initially diagnosed with Stage II IgA kappa multiple myeloma in 2019, s/p: T9-T10 laminectomy with resection of an epidural tumor and T8-T11 posterior fusion, adjuvant radiation therapy to the spine completed in August 2019, 5 cycles of RVD completed in December 2019 followed by maintenance on Revlimid  initiated on 08/24/2018, and a stem cell transplant on 04/03/2018.     She continued to be followed by Dr. Rogers and monitored through routine PET scans and MRI's. Patient underwent a PET scan on 08/24/2023 showing a new lucent bone lesions in the posterior aspect of the left 11th rib and right frontal bone. Scan also noted new lesions in the right calvarial convexity and proximal shaft of the left femur.  She underwent a bone marrow biopsy on 09/12/23. Abnormalities in monosomy 13, duplication of 1q, and TP53 deletion were detected on FISH analysis. Surgical pathology of the bone marrow revealed: Hypercellular bone marrow (30%) involved by a kappa restricted plasma cell neoplasm (30 to 40%) in the background of otherwise  orderly trilineage hematopoiesis.      Her most recent orbit MRI on 09/28/23  demonstrated an enhancing mass within the orbital roof which extends into the retro conal orbit and impresses upon the superior rectus muscle with mass measuring 17 mm in AP diameter, 29 mm in transverse diameter and 20 mm in craniocaudad length.  Scan also noted a separate circumscribed lesion within the sphenoid wing anterolateral to the anterior clinoid.    Subsequently, she received adjuvant chemotherapy with Dara Velcade  and dexamethasone  was initiated on 10/02/2023.   Patient notes blurred vision and a dry right eye since January of this year. She has recently noticed pain behind her right eye. She takes Tylenol  at night to help with this pain. She denies any pain or restriction with eye movement, or decreased sensation with the right side of her face. She also notes bilateral side pain that radiates around her abdomen. She is not able to tolerate sitting in a chair, other than her recliner, for extended periods of time. This pain is also alleviated with Tylenol .      PREVIOUS RADIATION THERAPY: Yes   Indication for treatment: Palliative       Radiation treatment dates: 11/03/22 through 11/16/22  Site/dose:  1) Right ribs - 25 Gy delivered in 10 Fx at 2.5 Gy/Fx  2) Left femur - 25 Gy delivered in 10 Fx at 2.5 Gy/Fx Technique/Mode: 3D, Isodose Plan / Photon  Beams/energy: 10X    Indication for treatment:  Palliative      Radiation treatment dates:   10/30/17 - 11/10/17 Site/dose:   The thoracic spine  target at T9-T10 was treated to 20 Gy in 10 fractions of 2 Gy. Beams/energy:   3D, Photons/ 10X    PAST MEDICAL HISTORY:  Past Medical History:  Diagnosis Date   Back pain    Cancer (HCC)    mult myeloma 2019   Hypercholesteremia    Hypertension    Recurrent multiple myeloma of bone marrow with unknown EBV status (HCC)     PAST SURGICAL HISTORY: Past Surgical History:  Procedure Laterality Date   BONE  MARROW BIOPSY     LAMINECTOMY N/A 10/06/2017   Procedure: THORACIC NINE AND TEN LAMINECTOMY WITH RESECTION OF TUMOR, THORACIC EIGHT TO THORACIC ELEVEN FUSION WITH PEDICLE SCREW FIXATION;  Surgeon: Louis Shove, MD;  Location: [facility] OR;  Service: Neurosurgery;  Laterality: N/A;   REPLACEMENT TOTAL KNEE Left 2003   TOTAL KNEE ARTHROPLASTY  2001    FAMILY HISTORY:  Family History  Problem Relation Age of Onset   Tuberculosis Mother    Kidney failure Father    Hypertension Paternal Aunt    Diabetes Paternal Aunt    Hypertension Paternal Uncle    Diabetes Paternal Uncle    Hypertension Daughter    Stroke Daughter     SOCIAL HISTORY:  Social History   Tobacco Use   Smoking status: Former    Current packs/day: 0.00    Average packs/day: 0.5 packs/day for 30.0 years (15.0 ttl pk-yrs)    Types: Cigarettes    Start date: 05/30/1976    Quit date: 05/31/2006    Years since quitting: 17.3   Smokeless tobacco: Never  Vaping Use   Vaping status: Never Used  Substance Use Topics   Alcohol use: Yes    Comment: wine occassionally   Drug use: No    ALLERGIES: No Known Allergies  MEDICATIONS:  Current Outpatient Medications  Medication Sig Dispense Refill   acetaminophen  (TYLENOL ) 500 MG tablet Take by mouth.     acyclovir  (ZOVIRAX ) 400 MG tablet Take 1 tablet (400 mg total) by mouth 2 (two) times daily. 60 tablet 3   amLODipine  (NORVASC ) 5 MG tablet Take 5 mg by mouth daily.      calcium carbonate (OSCAL) 1500 (600 Ca) MG TABS tablet Take by mouth.     dexamethasone  (DECADRON ) 4 MG tablet Take 1 tablet (4 mg total) by mouth once a week. Take 20 mg by mouth weekly the day after darzalex  injection 20 tablet 2   diltiazem  (CARDIZEM  SR) 60 MG 12 hr capsule Take 1 capsule (60 mg total) by mouth 2 (two) times daily. 180 capsule 2   gabapentin  (NEURONTIN ) 300 MG capsule Take 1 capsule (300 mg total) by mouth 2 (two) times daily. 60 capsule 3   hydrochlorothiazide  (MICROZIDE ) 12.5 MG capsule Take 1  capsule (12.5 mg total) by mouth daily. 45 capsule 3   LORazepam (ATIVAN) 0.5 MG tablet Take 0.25-0.5 mg by mouth 2 (two) times daily as needed for anxiety.     LOW-DOSE ASPIRIN PO Asprin Ec Low Dose     metoprolol  tartrate (LOPRESSOR ) 25 MG tablet Take 25 mg by mouth 2 (two) times daily.     Multiple Vitamins-Minerals (THERA-M) TABS Thera-M 9 mg iron-400 mcg tablet     polyethylene glycol (MIRALAX  / GLYCOLAX ) packet Take 17 g by mouth as needed.      potassium chloride  SA (KLOR-CON  M) 20 MEQ tablet TAKE 1 TABLET TWICE DAILY 180 tablet 3   prochlorperazine  (COMPAZINE ) 10 MG tablet Take 1 tablet (10 mg total) by mouth  every 6 (six) hours as needed. 60 tablet 2   vitamin B-12 (CYANOCOBALAMIN ) 1000 MCG tablet Vitamin B12     lenalidomide  (REVLIMID ) 5 MG capsule Take 1 capsule (5 mg total) by mouth daily. TAKE 1 CAPSULE BY MOUTH DAILY FOR 21 DAYS, HOLD FOR 7 DAYS, REPEAT EVERY 28 DAYS (Patient not taking: Reported on 10/11/2023) 21 capsule 0   No current facility-administered medications for this encounter.    REVIEW OF SYSTEMS:  Notable for that above   PHYSICAL EXAM:  height is 5' 3 (1.6 m) and weight is 157 lb 9.6 oz (71.5 kg). Her temperature is 97.2 F (36.2 C) (abnormal). Her blood pressure is 121/82 and her pulse is 93. Her respiration is 20 and oxygen saturation is 100%.   General: Alert and oriented, in no acute distress HEENT: Head is normocephalic. Extraocular movements are intact. Oropharynx is clear. Neck: Neck is supple, no palpable cervical or supraclavicular lymphadenopathy. Heart: Regular in rate and rhythm with no murmurs, rubs, or gallops. Chest: Clear to auscultation bilaterally, with no rhonchi, wheezes, or rales. Abdomen: Soft, nontender, nondistended, with no rigidity or guarding. Extremities: No cyanosis or edema. Lymphatics: see Neck Exam Skin: No concerning lesions. Musculoskeletal: symmetric strength and muscle tone throughout. Tenderness to palpation along the left  side of the mid-back.  Neurologic: Cranial nerves II through XII are grossly intact. No obvious focalities. Speech is fluent. Coordination is intact. Facial sensation is grossly intact. No obvious double vision.  Psychiatric: Judgment and insight are intact. Affect is appropriate.   ECOG = 1  0 - Asymptomatic (Fully active, able to carry on all predisease activities without restriction)  1 - Symptomatic but completely ambulatory (Restricted in physically strenuous activity but ambulatory and able to carry out work of a light or sedentary nature. For example, light housework, office work)  2 - Symptomatic, <50% in bed during the day (Ambulatory and capable of all self care but unable to carry out any work activities. Up and about more than 50% of waking hours)  3 - Symptomatic, >50% in bed, but not bedbound (Capable of only limited self-care, confined to bed or chair 50% or more of waking hours)  4 - Bedbound (Completely disabled. Cannot carry on any self-care. Totally confined to bed or chair)  5 - Death   Raylene MM, Creech RH, Tormey DC, et al. [phone]). Toxicity and response criteria of the [facility]. Am. DOROTHA Bridges. Oncol. 5 (6): 649-55  LABORATORY DATA:  Lab Results  Component Value Date   WBC 4.8 10/09/2023   HGB 13.5 10/09/2023   HCT 42.1 10/09/2023   MCV 101.9 (H) 10/09/2023   PLT 154 10/09/2023   NEUTROABS 3.5 10/09/2023   Lab Results  Component Value Date   NA 131 (L) 10/09/2023   K 3.6 10/09/2023   CL 95 (L) 10/09/2023   CO2 20 (L) 10/09/2023   GLUCOSE 124 (H) 10/09/2023   BUN 19 10/09/2023   CREATININE 0.79 10/09/2023   CALCIUM 8.7 (L) 10/09/2023      RADIOGRAPHY: MR ORBITS W WO CONTRAST Result Date: 09/28/2023 CLINICAL DATA:  vision changes; right eye drooping EXAM: MRI OF THE ORBITS WITHOUT AND WITH CONTRAST TECHNIQUE: Multiplanar, multi-echo pulse sequences of the orbits and surrounding structures were acquired including fat saturation  techniques, before and after intravenous contrast administration. CONTRAST:  7mL GADAVIST  GADOBUTROL  1 MMOL/ML IV SOLN COMPARISON:  MRI of the head dated October 06, 2022. FINDINGS: Orbits: There is an expansile, avidly enhancing mass  present within the right orbital roof measuring approximately 17 mm in AP diameter, 29 mm in transverse diameter and 20 mm in craniocaudad length. The lesion protrudes into the retrobulbar right orbit and impresses upon the superior rectus muscle. It extends medially within the orbital roof and extends in the lateral aspect of the frontal sinus and superomedial orbital wall. There is also a separate circumscribed lesion within the greater sphenoid wing anterolateral to the anterior clinoid process. The patient is status post bilateral lens replacement. The optic nerves are normal in morphology and signal intensity. The optic sheaths are nondilated. The extra-axial air muscles are normal in caliber. There is no mass effect upon the optic nerves or optic chiasm. Visualized sinuses: The right frontal sinus is partially opacified. The paranasal sinuses are clear otherwise. Soft tissues: The periorbital soft tissues are unremarkable. Limited intracranial: Mild periventricular white matter disease. Normal appearance of the brain otherwise. IMPRESSION: 1. There is a circumscribed avidly enhancing mass within the orbital roof which extends into the retro conal orbit and impresses upon the superior rectus muscle. A separate circumscribed lesion is noted within the sphenoid wing anterolateral to the anterior clinoid. The findings presumably represent multiple myeloma. Electronically Signed   By: Evalene Coho M.D.   On: 09/28/2023 15:25   CT BONE MARROW BIOPSY & ASPIRATION Result Date: 09/12/2023 INDICATION: 83 year old female with history of multiple myeloma. EXAM: CT-GUIDED BONE MARROW BIOPSY AND ASPIRATION MEDICATIONS: None ANESTHESIA/SEDATION: Fentanyl  50 mcg IV; Versed  2 mg IV Sedation  Time: 7 minutes; The patient was continuously monitored during the procedure by the interventional radiology nurse under my direct supervision. COMPLICATIONS: None immediate. PROCEDURE: Informed consent was obtained from the patient following an explanation of the procedure, risks, benefits and alternatives. The patient understands, agrees and consents for the procedure. All questions were addressed. A time out was performed prior to the initiation of the procedure. The patient was positioned prone and non-contrast localization CT was performed of the pelvis to demonstrate the iliac marrow spaces. The operative site was prepped and draped in the usual sterile fashion. Under sterile conditions and local anesthesia, a 22 gauge spinal needle was utilized for procedural planning. Next, an 11 gauge coaxial bone biopsy needle was advanced into the right iliac marrow space. Needle position was confirmed with CT imaging. Initially, a bone marrow aspiration was performed. Next, a bone marrow biopsy was obtained with the 11 gauge outer bone marrow device. The needle was removed and superficial hemostasis was obtained with manual compression. A dressing was applied. The patient tolerated the procedure well without immediate post procedural complication. IMPRESSION: Successful CT guided right iliac bone marrow aspiration and core biopsy. Ester Sides, MD [facility] Radiology Electronically Signed   By: Ester Sides M.D.   On: 09/12/2023 13:05      IMPRESSION: Stage II IgA kappa multiple myeloma, now with a new orbital roof mass  It was a pleasure seeing this patient today. She presents today with a new right, orbital roof mass and left 11th rib lesion. Unfortunately, she is experiencing discomfort to both of these regions. Dr. Shannon is recommending palliative radiation to these areas.   Today, I talked to the patient and family about the findings and work-up thus far.   We discussed the natural history of multiple myeloma and general treatment, highlighting the role of radiotherapy in the management.  We discussed the available radiation techniques, and focused on the details of logistics and delivery.  We reviewed the  anticipated acute and late sequelae associated with radiation in this setting.  The patient was encouraged to ask questions that I answered to the best of my ability. A patient consent form was discussed and signed.  We retained a copy for our records.  The patient would like to proceed with radiation and will be scheduled for CT simulation.  PLAN: Patient is scheduled for CT simulation later today. Dr. Shannon recommends radiation treatment to the orbital mass and left 11th rib.  Anticipate 10-15 treatments to these areas.   40 minutes of total time was spent for this patient encounter, including preparation, face-to-face counseling with the patient and coordination of care, physical exam, and documentation of the encounter.   ------------------------------------------------   Leeroy Due, PA-C   Lynwood CHARM Shannon, PhD, MD   [facility]  Radiation Oncology Direct Dial: [phone]  Fax: [phone] [email or web address]    This document serves as a record of services personally performed by Lynwood Shannon, MD. It was created on his behalf by Reymundo Cartwright, a trained medical scribe. The creation of this record is based on the scribe's personal observations and the provider's statements to them. This document has been checked and approved by the attending provider.

## 2023-10-12 ENCOUNTER — Inpatient Hospital Stay

## 2023-10-12 VITALS — BP 125/68 | HR 85 | Temp 97.7°F | Resp 16

## 2023-10-12 DIAGNOSIS — C9000 Multiple myeloma not having achieved remission: Secondary | ICD-10-CM | POA: Diagnosis not present

## 2023-10-12 DIAGNOSIS — Z5112 Encounter for antineoplastic immunotherapy: Secondary | ICD-10-CM | POA: Diagnosis not present

## 2023-10-12 DIAGNOSIS — Z79899 Other long term (current) drug therapy: Secondary | ICD-10-CM | POA: Diagnosis not present

## 2023-10-12 MED ORDER — BORTEZOMIB CHEMO SQ INJECTION 3.5 MG (2.5MG/ML)
1.0000 mg/m2 | Freq: Once | INTRAMUSCULAR | Status: AC
  Administered 2023-10-12: 1.75 mg via SUBCUTANEOUS
  Filled 2023-10-12: qty 0.7

## 2023-10-12 MED ORDER — DEXAMETHASONE 4 MG PO TABS
20.0000 mg | ORAL_TABLET | Freq: Once | ORAL | Status: AC
  Administered 2023-10-12: 20 mg via ORAL
  Filled 2023-10-12: qty 5

## 2023-10-12 NOTE — Patient Instructions (Signed)
 [facility] [place] - A DEPT OF [facility]  Discharge Instructions: Thank you for choosing [facility] to provide your oncology and hematology care.  If you have a lab appointment with the Cancer Center - please note that after April 8th, 2024, all labs will be drawn in the cancer center.  You do not have to check in or register with the main entrance as you have in the past but will complete your check-in in the cancer center.  Wear comfortable clothing and clothing appropriate for easy access to any Portacath or PICC line.   We strive to give you quality time with your provider. You may need to reschedule your appointment if you arrive late (15 or more minutes).  Arriving late affects you and other patients whose appointments are after yours.  Also, if you miss three or more appointments without notifying the office, you may be dismissed from the clinic at the provider's discretion.      For prescription refill requests, have your pharmacy contact our office and allow 72 hours for refills to be completed.    Today you received the following chemotherapy and/or immunotherapy agents Velcade, return as scheduled.   To help prevent nausea and vomiting after your treatment, we encourage you to take your nausea medication as directed.  BELOW ARE SYMPTOMS THAT SHOULD BE REPORTED IMMEDIATELY: *FEVER GREATER THAN 100.4 F (38 C) OR HIGHER *CHILLS OR SWEATING *NAUSEA AND VOMITING THAT IS NOT CONTROLLED WITH YOUR NAUSEA MEDICATION *UNUSUAL SHORTNESS OF BREATH *UNUSUAL BRUISING OR BLEEDING *URINARY PROBLEMS (pain or burning when urinating, or frequent urination) *BOWEL PROBLEMS (unusual diarrhea, constipation, pain near the anus) TENDERNESS IN MOUTH AND THROAT WITH OR WITHOUT PRESENCE OF ULCERS (sore throat, sores in mouth, or a toothache) UNUSUAL RASH, SWELLING OR PAIN  UNUSUAL VAGINAL DISCHARGE OR ITCHING   Items with * indicate a potential emergency and  should be followed up as soon as possible or go to the Emergency Department if any problems should occur.  Please show the CHEMOTHERAPY ALERT CARD or IMMUNOTHERAPY ALERT CARD at check-in to the Emergency Department and triage nurse.  Should you have questions after your visit or need to cancel or reschedule your appointment, please contact [facility] [place] - A DEPT OF [facility] [phone]  and follow the prompts.  Office hours are 8:00 a.m. to 4:30 p.m. Monday - Friday. Please note that voicemails left after 4:00 p.m. may not be returned until the following business day.  We are closed weekends and major holidays. You have access to a nurse at all times for urgent questions. Please call the main number to the clinic [phone] and follow the prompts.  For any non-urgent questions, you may also contact your provider using MyChart. We now offer e-Visits for anyone 68 and older to request care online for non-urgent symptoms. For details visit [email or web address].   Also download the MyChart app! Go to the app store, search "MyChart", open the app, select [place], and log in with your MyChart username and password.

## 2023-10-12 NOTE — Progress Notes (Signed)
 Patient tolerated Velcade injection with no complaints voiced.  Lab work reviewed.  See MAR for details.  Injection site clean and dry with no bruising or swelling noted.  Patient stable during and after injection.  Band aid applied.  VSS.  Patient left in satisfactory condition with no s/s of distress noted.

## 2023-10-13 ENCOUNTER — Other Ambulatory Visit: Payer: Self-pay

## 2023-10-13 DIAGNOSIS — C9000 Multiple myeloma not having achieved remission: Secondary | ICD-10-CM

## 2023-10-14 ENCOUNTER — Other Ambulatory Visit: Payer: Self-pay

## 2023-10-16 ENCOUNTER — Inpatient Hospital Stay

## 2023-10-16 VITALS — BP 135/59 | HR 80 | Resp 19 | Wt 154.5 lb

## 2023-10-16 DIAGNOSIS — Z5112 Encounter for antineoplastic immunotherapy: Secondary | ICD-10-CM | POA: Diagnosis not present

## 2023-10-16 DIAGNOSIS — C9000 Multiple myeloma not having achieved remission: Secondary | ICD-10-CM

## 2023-10-16 DIAGNOSIS — Z79899 Other long term (current) drug therapy: Secondary | ICD-10-CM | POA: Diagnosis not present

## 2023-10-16 LAB — CBC WITH DIFFERENTIAL/PLATELET
Abs Immature Granulocytes: 0.02 K/uL (ref 0.00–0.07)
Basophils Absolute: 0 K/uL (ref 0.0–0.1)
Basophils Relative: 0 %
Eosinophils Absolute: 0 K/uL (ref 0.0–0.5)
Eosinophils Relative: 1 %
HCT: 40.8 % (ref 36.0–46.0)
Hemoglobin: 13.5 g/dL (ref 12.0–15.0)
Immature Granulocytes: 0 %
Lymphocytes Relative: 10 %
Lymphs Abs: 0.5 K/uL — ABNORMAL LOW (ref 0.7–4.0)
MCH: 32.9 pg (ref 26.0–34.0)
MCHC: 33.1 g/dL (ref 30.0–36.0)
MCV: 99.5 fL (ref 80.0–100.0)
Monocytes Absolute: 0.5 K/uL (ref 0.1–1.0)
Monocytes Relative: 11 %
Neutro Abs: 3.7 K/uL (ref 1.7–7.7)
Neutrophils Relative %: 78 %
Platelets: 115 K/uL — ABNORMAL LOW (ref 150–400)
RBC: 4.1 MIL/uL (ref 3.87–5.11)
RDW: 15 % (ref 11.5–15.5)
WBC: 4.7 K/uL (ref 4.0–10.5)
nRBC: 0 % (ref 0.0–0.2)

## 2023-10-16 MED ORDER — DEXAMETHASONE 4 MG PO TABS
20.0000 mg | ORAL_TABLET | Freq: Once | ORAL | Status: AC
  Administered 2023-10-16: 20 mg via ORAL
  Filled 2023-10-16: qty 5

## 2023-10-16 MED ORDER — MONTELUKAST SODIUM 10 MG PO TABS
10.0000 mg | ORAL_TABLET | Freq: Once | ORAL | Status: AC
  Administered 2023-10-16: 10 mg via ORAL
  Filled 2023-10-16: qty 1

## 2023-10-16 MED ORDER — DIPHENHYDRAMINE HCL 25 MG PO CAPS
50.0000 mg | ORAL_CAPSULE | Freq: Once | ORAL | Status: AC
  Administered 2023-10-16: 50 mg via ORAL
  Filled 2023-10-16: qty 2

## 2023-10-16 MED ORDER — ACETAMINOPHEN 325 MG PO TABS
650.0000 mg | ORAL_TABLET | Freq: Once | ORAL | Status: AC
Start: 2023-10-16 — End: 2023-10-16
  Administered 2023-10-16: 650 mg via ORAL
  Filled 2023-10-16: qty 2

## 2023-10-16 MED ORDER — DARATUMUMAB-HYALURONIDASE-FIHJ 1800-30000 MG-UT/15ML ~~LOC~~ SOLN
1800.0000 mg | Freq: Once | SUBCUTANEOUS | Status: AC
  Administered 2023-10-16: 1800 mg via SUBCUTANEOUS
  Filled 2023-10-16: qty 15

## 2023-10-16 NOTE — Patient Instructions (Signed)
 [facility] [place] - A DEPT OF [facility]  Discharge Instructions: Thank you for choosing [facility] to provide your oncology and hematology care.  If you have a lab appointment with the Cancer Center - please note that after April 8th, 2024, all labs will be drawn in the cancer center.  You do not have to check in or register with the main entrance as you have in the past but will complete your check-in in the cancer center.  Wear comfortable clothing and clothing appropriate for easy access to any Portacath or PICC line.   We strive to give you quality time with your provider. You may need to reschedule your appointment if you arrive late (15 or more minutes).  Arriving late affects you and other patients whose appointments are after yours.  Also, if you miss three or more appointments without notifying the office, you may be dismissed from the clinic at the provider's discretion.      For prescription refill requests, have your pharmacy contact our office and allow 72 hours for refills to be completed.    Today you received the following chemotherapy and/or immunotherapy agents Daratumumab.  Daratumumab; Hyaluronidase Injection What is this medication? DARATUMUMAB; HYALURONIDASE (dar a toom ue mab; hye al ur ON i dase) treats multiple myeloma, a type of bone marrow cancer. Daratumumab works by blocking a protein that causes cancer cells to grow and multiply. This helps to slow or stop the spread of cancer cells. Hyaluronidase works by increasing the absorption of other medications in the body to help them work better. This medication may also be used treat amyloidosis, a condition that causes the buildup of a protein (amyloid) in your body. It works by reducing the buildup of this protein, which decreases symptoms. It is a combination medication that contains a monoclonal antibody. This medicine may be used for other purposes; ask your health care provider  or pharmacist if you have questions. COMMON BRAND NAME(S): DARZALEX FASPRO What should I tell my care team before I take this medication? They need to know if you have any of these conditions: Heart disease Infection, such as chickenpox, cold sores, herpes, hepatitis B Lung or breathing disease An unusual or allergic reaction to daratumumab, hyaluronidase, other medications, foods, dyes, or preservatives Pregnant or trying to get pregnant Breast-feeding How should I use this medication? This medication is injected under the skin. It is given by your care team in a hospital or clinic setting. Talk to your care team about the use of this medication in children. Special care may be needed. Overdosage: If you think you have taken too much of this medicine contact a poison control center or emergency room at once. NOTE: This medicine is only for you. Do not share this medicine with others. What if I miss a dose? Keep appointments for follow-up doses. It is important not to miss your dose. Call your care team if you are unable to keep an appointment. What may interact with this medication? Interactions have not been studied. This list may not describe all possible interactions. Give your health care provider a list of all the medicines, herbs, non-prescription drugs, or dietary supplements you use. Also tell them if you smoke, drink alcohol, or use illegal drugs. Some items may interact with your medicine. What should I watch for while using this medication? Your condition will be monitored carefully while you are receiving this medication. This medication can cause serious allergic  reactions. To reduce your risk, your care team may give you other medication to take before receiving this one. Be sure to follow the directions from your care team. This medication can affect the results of blood tests to match your blood type. These changes can last for up to 6 months after the final dose. Your care  team will do blood tests to match your blood type before you start treatment. Tell all of your care team that you are being treated with this medication before receiving a blood transfusion. This medication can affect the results of some tests used to determine treatment response; extra tests may be needed to evaluate response. Talk to your care team if you wish to become pregnant or think you are pregnant. This medication can cause serious birth defects if taken during pregnancy and for 3 months after the last dose. A reliable form of contraception is recommended while taking this medication and for 3 months after the last dose. Talk to your care team about effective forms of contraception. Do not breast-feed while taking this medication. What side effects may I notice from receiving this medication? Side effects that you should report to your care team as soon as possible: Allergic reactions--skin rash, itching, hives, swelling of the face, lips, tongue, or throat Heart rhythm changes--fast or irregular heartbeat, dizziness, feeling faint or lightheaded, chest pain, trouble breathing Infection--fever, chills, cough, sore throat, wounds that don't heal, pain or trouble when passing urine, general feeling of discomfort or being unwell Infusion reactions--chest pain, shortness of breath or trouble breathing, feeling faint or lightheaded Sudden eye pain or change in vision such as blurry vision, seeing halos around lights, vision loss Unusual bruising or bleeding Side effects that usually do not require medical attention (report to your care team if they continue or are bothersome): Constipation Diarrhea Fatigue Nausea Pain, tingling, or numbness in the hands or feet Swelling of the ankles, hands, or feet This list may not describe all possible side effects. Call your doctor for medical advice about side effects. You may report side effects to FDA at 1-800-FDA-1088. Where should I keep my  medication? This medication is given in a hospital or clinic. It will not be stored at home. NOTE: This sheet is a summary. It may not cover all possible information. If you have questions about this medicine, talk to your doctor, pharmacist, or health care provider.  2024 Elsevier/Gold Standard (2021-07-13 00:00:00)        To help prevent nausea and vomiting after your treatment, we encourage you to take your nausea medication as directed.  BELOW ARE SYMPTOMS THAT SHOULD BE REPORTED IMMEDIATELY: *FEVER GREATER THAN 100.4 F (38 C) OR HIGHER *CHILLS OR SWEATING *NAUSEA AND VOMITING THAT IS NOT CONTROLLED WITH YOUR NAUSEA MEDICATION *UNUSUAL SHORTNESS OF BREATH *UNUSUAL BRUISING OR BLEEDING *URINARY PROBLEMS (pain or burning when urinating, or frequent urination) *BOWEL PROBLEMS (unusual diarrhea, constipation, pain near the anus) TENDERNESS IN MOUTH AND THROAT WITH OR WITHOUT PRESENCE OF ULCERS (sore throat, sores in mouth, or a toothache) UNUSUAL RASH, SWELLING OR PAIN  UNUSUAL VAGINAL DISCHARGE OR ITCHING   Items with * indicate a potential emergency and should be followed up as soon as possible or go to the Emergency Department if any problems should occur.  Please show the CHEMOTHERAPY ALERT CARD or IMMUNOTHERAPY ALERT CARD at check-in to the Emergency Department and triage nurse.  Should you have questions after your visit or need to cancel or reschedule your appointment, please contact  [facility] [place] - A DEPT OF [facility]igh[facility] [phone]  and follow the prompts.  Office hours are 8:00 a.m. to 4:30 p.m. Monday - Friday. Please note that voicemails left after 4:00 p.m. may not be returned until the following business day.  We are closed weekends and major holidays. You have access to a nurse at all times for urgent questions. Please call the main number to the clinic [phone] and follow the prompts.  For any non-urgent questions, you may also contact  your provider using MyChart. We now offer e-Visits for anyone 50 and older to request care online for non-urgent symptoms. For details visit [email or web address].   Also download the MyChart app! Go to the app store, search "MyChart", open the app, select [place], and log in with your MyChart username and password.

## 2023-10-16 NOTE — Progress Notes (Signed)
 Patient presents today for Daratumumab  injection.  Patient is in satisfactory condition with no new complaints voiced.  Vital signs are stable.  Labs reviewed and all labs are within treatment parameters.  We will proceed with treatment per MD orders.    Patient tolerated injection with no complaints voiced.  Site clean and dry with no bruising or swelling noted.  No complaints of pain.  Discharged with vital signs stable and no signs or symptoms of distress noted.

## 2023-10-18 DIAGNOSIS — Z923 Personal history of irradiation: Secondary | ICD-10-CM | POA: Diagnosis not present

## 2023-10-18 DIAGNOSIS — C9002 Multiple myeloma in relapse: Secondary | ICD-10-CM | POA: Diagnosis not present

## 2023-10-18 DIAGNOSIS — C7951 Secondary malignant neoplasm of bone: Secondary | ICD-10-CM | POA: Diagnosis not present

## 2023-10-18 DIAGNOSIS — C9000 Multiple myeloma not having achieved remission: Secondary | ICD-10-CM | POA: Diagnosis not present

## 2023-10-18 DIAGNOSIS — Z510 Encounter for antineoplastic radiation therapy: Secondary | ICD-10-CM | POA: Diagnosis not present

## 2023-10-19 ENCOUNTER — Other Ambulatory Visit: Payer: Self-pay

## 2023-10-19 ENCOUNTER — Ambulatory Visit
Admission: RE | Admit: 2023-10-19 | Discharge: 2023-10-19 | Disposition: A | Discharge status: Home / Self Care | Source: Ambulatory Visit | Attending: Radiation Oncology | Admitting: Radiation Oncology

## 2023-10-19 DIAGNOSIS — Z923 Personal history of irradiation: Secondary | ICD-10-CM | POA: Diagnosis not present

## 2023-10-19 DIAGNOSIS — C7951 Secondary malignant neoplasm of bone: Secondary | ICD-10-CM | POA: Diagnosis not present

## 2023-10-19 DIAGNOSIS — C9000 Multiple myeloma not having achieved remission: Secondary | ICD-10-CM | POA: Diagnosis not present

## 2023-10-19 DIAGNOSIS — C9002 Multiple myeloma in relapse: Secondary | ICD-10-CM | POA: Diagnosis not present

## 2023-10-19 DIAGNOSIS — Z510 Encounter for antineoplastic radiation therapy: Secondary | ICD-10-CM | POA: Diagnosis not present

## 2023-10-19 LAB — RAD ONC ARIA SESSION SUMMARY
Course Elapsed Days: 0
Plan Fractions Treated to Date: 1
Plan Prescribed Dose Per Fraction: 2 Gy
Plan Total Fractions Prescribed: 13
Plan Total Prescribed Dose: 26 Gy
Reference Point Dosage Given to Date: 2 Gy
Reference Point Session Dosage Given: 0.8155 Gy
Session Number: 1

## 2023-10-20 ENCOUNTER — Ambulatory Visit
Admission: RE | Admit: 2023-10-20 | Discharge: 2023-10-20 | Disposition: A | Discharge status: Home / Self Care | Source: Ambulatory Visit | Attending: Radiation Oncology | Admitting: Radiation Oncology

## 2023-10-20 ENCOUNTER — Other Ambulatory Visit: Payer: Self-pay

## 2023-10-20 DIAGNOSIS — Z510 Encounter for antineoplastic radiation therapy: Secondary | ICD-10-CM | POA: Diagnosis not present

## 2023-10-20 DIAGNOSIS — Z923 Personal history of irradiation: Secondary | ICD-10-CM | POA: Insufficient documentation

## 2023-10-20 DIAGNOSIS — C9000 Multiple myeloma not having achieved remission: Secondary | ICD-10-CM | POA: Diagnosis not present

## 2023-10-20 DIAGNOSIS — C7951 Secondary malignant neoplasm of bone: Secondary | ICD-10-CM | POA: Diagnosis not present

## 2023-10-20 LAB — RAD ONC ARIA SESSION SUMMARY
Course Elapsed Days: 1
Plan Fractions Treated to Date: 1
Plan Fractions Treated to Date: 2
Plan Prescribed Dose Per Fraction: 2 Gy
Plan Prescribed Dose Per Fraction: 2.5 Gy
Plan Total Fractions Prescribed: 10
Plan Total Fractions Prescribed: 12
Plan Total Prescribed Dose: 24 Gy
Plan Total Prescribed Dose: 25 Gy
Reference Point Dosage Given to Date: 4 Gy
Reference Point Dosage Given to Date: 5 Gy
Reference Point Session Dosage Given: 2 Gy
Reference Point Session Dosage Given: 2.5 Gy
Session Number: 2

## 2023-10-23 ENCOUNTER — Other Ambulatory Visit: Payer: Self-pay

## 2023-10-23 ENCOUNTER — Inpatient Hospital Stay

## 2023-10-23 ENCOUNTER — Inpatient Hospital Stay: Admitting: Dietician

## 2023-10-23 ENCOUNTER — Ambulatory Visit
Admission: RE | Admit: 2023-10-23 | Discharge: 2023-10-23 | Disposition: A | Discharge status: Home / Self Care | Source: Ambulatory Visit | Attending: Radiation Oncology | Admitting: Radiation Oncology

## 2023-10-23 ENCOUNTER — Inpatient Hospital Stay: Admitting: Hematology

## 2023-10-23 VITALS — BP 112/60 | HR 85 | Temp 98.1°F | Resp 16 | Wt 155.0 lb

## 2023-10-23 DIAGNOSIS — C7951 Secondary malignant neoplasm of bone: Secondary | ICD-10-CM | POA: Diagnosis not present

## 2023-10-23 DIAGNOSIS — C9000 Multiple myeloma not having achieved remission: Secondary | ICD-10-CM

## 2023-10-23 DIAGNOSIS — Z923 Personal history of irradiation: Secondary | ICD-10-CM | POA: Diagnosis not present

## 2023-10-23 DIAGNOSIS — Z5112 Encounter for antineoplastic immunotherapy: Secondary | ICD-10-CM | POA: Insufficient documentation

## 2023-10-23 DIAGNOSIS — Z79899 Other long term (current) drug therapy: Secondary | ICD-10-CM | POA: Insufficient documentation

## 2023-10-23 DIAGNOSIS — Z510 Encounter for antineoplastic radiation therapy: Secondary | ICD-10-CM | POA: Diagnosis not present

## 2023-10-23 LAB — COMPREHENSIVE METABOLIC PANEL WITH GFR
ALT: 28 U/L (ref 0–44)
AST: 17 U/L (ref 15–41)
Albumin: 3.2 g/dL — ABNORMAL LOW (ref 3.5–5.0)
Alkaline Phosphatase: 85 U/L (ref 38–126)
Anion gap: 13 (ref 5–15)
BUN: 15 mg/dL (ref 8–23)
CO2: 24 mmol/L (ref 22–32)
Calcium: 9.1 mg/dL (ref 8.9–10.3)
Chloride: 99 mmol/L (ref 98–111)
Creatinine, Ser: 0.61 mg/dL (ref 0.44–1.00)
GFR, Estimated: >60 mL/min (ref >60)
Glucose, Bld: 145 mg/dL — ABNORMAL HIGH (ref 70–99)
Potassium: 3.8 mmol/L (ref 3.5–5.1)
Sodium: 136 mmol/L (ref 135–145)
Total Bilirubin: 0.7 mg/dL (ref 0.0–1.2)
Total Protein: 7.3 g/dL (ref 6.5–8.1)

## 2023-10-23 LAB — CBC WITH DIFFERENTIAL/PLATELET
Abs Immature Granulocytes: 0.01 K/uL (ref 0.00–0.07)
Basophils Absolute: 0 K/uL (ref 0.0–0.1)
Basophils Relative: 0 %
Eosinophils Absolute: 0 K/uL (ref 0.0–0.5)
Eosinophils Relative: 0 %
HCT: 39.1 % (ref 36.0–46.0)
Hemoglobin: 12.7 g/dL (ref 12.0–15.0)
Immature Granulocytes: 0 %
Lymphocytes Relative: 6 %
Lymphs Abs: 0.2 K/uL — ABNORMAL LOW (ref 0.7–4.0)
MCH: 32.6 pg (ref 26.0–34.0)
MCHC: 32.5 g/dL (ref 30.0–36.0)
MCV: 100.3 fL — ABNORMAL HIGH (ref 80.0–100.0)
Monocytes Absolute: 0.2 K/uL (ref 0.1–1.0)
Monocytes Relative: 6 %
Neutro Abs: 3.2 K/uL (ref 1.7–7.7)
Neutrophils Relative %: 88 %
Platelets: 145 K/uL — ABNORMAL LOW (ref 150–400)
RBC: 3.9 MIL/uL (ref 3.87–5.11)
RDW: 15.3 % (ref 11.5–15.5)
WBC: 3.6 K/uL — ABNORMAL LOW (ref 4.0–10.5)
nRBC: 0 % (ref 0.0–0.2)

## 2023-10-23 LAB — RAD ONC ARIA SESSION SUMMARY
Course Elapsed Days: 4
Plan Fractions Treated to Date: 2
Plan Fractions Treated to Date: 3
Plan Prescribed Dose Per Fraction: 2 Gy
Plan Prescribed Dose Per Fraction: 2.5 Gy
Plan Total Fractions Prescribed: 10
Plan Total Fractions Prescribed: 12
Plan Total Prescribed Dose: 24 Gy
Plan Total Prescribed Dose: 25 Gy
Reference Point Dosage Given to Date: 6 Gy
Reference Point Dosage Given to Date: 7.5 Gy
Reference Point Session Dosage Given: 2 Gy
Reference Point Session Dosage Given: 2.5 Gy
Session Number: 3

## 2023-10-23 MED ORDER — DIPHENHYDRAMINE HCL 25 MG PO CAPS: 50.0000 mg | ORAL_CAPSULE | Freq: Once | ORAL | Status: DC

## 2023-10-23 MED ORDER — BORTEZOMIB CHEMO SQ INJECTION 3.5 MG (2.5MG/ML)
1.0000 mg/m2 | Freq: Once | INTRAMUSCULAR | Status: AC
  Administered 2023-10-23: 1.75 mg via SUBCUTANEOUS
  Filled 2023-10-23: qty 0.7

## 2023-10-23 MED ORDER — CETIRIZINE HCL 10 MG PO TABS
10.0000 mg | ORAL_TABLET | Freq: Once | ORAL | Status: AC
  Administered 2023-10-23: 10 mg via ORAL
  Filled 2023-10-23: qty 1

## 2023-10-23 MED ORDER — DEXAMETHASONE 4 MG PO TABS
20.0000 mg | ORAL_TABLET | Freq: Once | ORAL | Status: AC
  Administered 2023-10-23: 20 mg via ORAL
  Filled 2023-10-23: qty 5

## 2023-10-23 MED ORDER — ACETAMINOPHEN 325 MG PO TABS
650.0000 mg | ORAL_TABLET | Freq: Once | ORAL | Status: AC
  Administered 2023-10-23: 650 mg via ORAL
  Filled 2023-10-23: qty 2

## 2023-10-23 MED ORDER — DARATUMUMAB-HYALURONIDASE-FIHJ 1800-30000 MG-UT/15ML ~~LOC~~ SOLN
1800.0000 mg | Freq: Once | SUBCUTANEOUS | Status: AC
  Administered 2023-10-23: 1800 mg via SUBCUTANEOUS
  Filled 2023-10-23: qty 15

## 2023-10-23 NOTE — Progress Notes (Signed)
Patient has been assessed, vital signs and labs have been reviewed by Dr. Katragadda. ANC, Creatinine, LFTs, and Platelets are within treatment parameters per Dr. Katragadda. The patient is good to proceed with treatment at this time. Primary RN and pharmacy aware.  

## 2023-10-23 NOTE — Progress Notes (Signed)
Patient tolerated Daratumumab and Velcade injection with no complaints voiced.  See MAR for details.  Labs reviewed. Injection site clean and dry with no bruising or swelling noted at site.  Band aid applied.  Vss with discharge and left in satisfactory condition with no s/s of distress noted.

## 2023-10-23 NOTE — Progress Notes (Signed)
 Patient with excessive drowsiness.  Discontinue benadryl  and change to cetirizine  10 mg orally x 1 prior to Darzalex  Faspro.  T.O. Dr Theadore Molt, PharmD

## 2023-10-23 NOTE — Patient Instructions (Addendum)
[facility] [place]  Discharge Instructions  You were seen and examined today by Dr. Katragadda.  Dr. Katragadda discussed your most recent lab work which is stable.   Proceed with treatment today as planned.  Follow-up as scheduled.  Thank you for choosing [facility] [place] to provide your oncology and hematology care.   To afford each patient quality time with our provider, please arrive at least 15 minutes before your scheduled appointment time. You may need to reschedule your appointment if you arrive late (10 or more minutes). Arriving late affects you and other patients whose appointments are after yours.  Also, if you miss three or more appointments without notifying the office, you may be dismissed from the clinic at the provider's discretion.    Again, thank you for choosing [facility].  Our hope is that these requests will decrease the amount of time that you wait before being seen by our physicians.   If you have a lab appointment with the Cancer Center - please note that after April 8th, all labs will be drawn in the cancer center.  You do not have to check in or register with the main entrance as you have in the past but will complete your check-in at the cancer center.            _____________________________________________________________  Should you have questions after your visit to [facility], please contact our office at [phone] and follow the prompts.  Our office hours are 8:00 a.m. to 4:30 p.m. Monday - Thursday and 8:00 a.m. to 2:30 p.m. Friday.  Please note that voicemails left after 4:00 p.m. may not be returned until the following business day.  We are closed weekends and all major holidays.  You do have access to a nurse 24-7, just call the main number to the clinic [phone] and do not press any options, hold on the line and a nurse will answer the phone.    For prescription refill  requests, have your pharmacy contact our office and allow 72 hours.    Masks are no longer required in the cancer centers. If you would like for your care team to wear a mask while they are taking care of you, please let them know. You may have one support person who is at least 82 years old accompany you for your appointments.  

## 2023-10-23 NOTE — Patient Instructions (Signed)
 [facility] [place] - A DEPT OF [facility]  Discharge Instructions: Thank you for choosing [facility] to provide your oncology and hematology care.  If you have a lab appointment with the Cancer Center - please note that after April 8th, 2024, all labs will be drawn in the cancer center.  You do not have to check in or register with the main entrance as you have in the past but will complete your check-in in the cancer center.  Wear comfortable clothing and clothing appropriate for easy access to any Portacath or PICC line.   We strive to give you quality time with your provider. You may need to reschedule your appointment if you arrive late (15 or more minutes).  Arriving late affects you and other patients whose appointments are after yours.  Also, if you miss three or more appointments without notifying the office, you may be dismissed from the clinic at the provider's discretion.      For prescription refill requests, have your pharmacy contact our office and allow 72 hours for refills to be completed.    Today you received the following chemotherapy and/or immunotherapy agents Darzalex  and Velcade .       To help prevent nausea and vomiting after your treatment, we encourage you to take your nausea medication as directed.  BELOW ARE SYMPTOMS THAT SHOULD BE REPORTED IMMEDIATELY: *FEVER GREATER THAN 100.4 F (38 C) OR HIGHER *CHILLS OR SWEATING *NAUSEA AND VOMITING THAT IS NOT CONTROLLED WITH YOUR NAUSEA MEDICATION *UNUSUAL SHORTNESS OF BREATH *UNUSUAL BRUISING OR BLEEDING *URINARY PROBLEMS (pain or burning when urinating, or frequent urination) *BOWEL PROBLEMS (unusual diarrhea, constipation, pain near the anus) TENDERNESS IN MOUTH AND THROAT WITH OR WITHOUT PRESENCE OF ULCERS (sore throat, sores in mouth, or a toothache) UNUSUAL RASH, SWELLING OR PAIN  UNUSUAL VAGINAL DISCHARGE OR ITCHING   Items with * indicate a potential emergency and should  be followed up as soon as possible or go to the Emergency Department if any problems should occur.  Please show the CHEMOTHERAPY ALERT CARD or IMMUNOTHERAPY ALERT CARD at check-in to the Emergency Department and triage nurse.  Should you have questions after your visit or need to cancel or reschedule your appointment, please contact [facility] [place] - A DEPT OF [facility] [phone]  and follow the prompts.  Office hours are 8:00 a.m. to 4:30 p.m. Monday - Friday. Please note that voicemails left after 4:00 p.m. may not be returned until the following business day.  We are closed weekends and major holidays. You have access to a nurse at all times for urgent questions. Please call the main number to the clinic [phone] and follow the prompts.  For any non-urgent questions, you may also contact your provider using MyChart. We now offer e-Visits for anyone 82 and older to request care online for non-urgent symptoms. For details visit [email or web address].   Also download the MyChart app! Go to the app store, search "MyChart", open the app, select [place], and log in with your MyChart username and password.

## 2023-10-23 NOTE — Progress Notes (Signed)
 [facility] [address] [place] [address]    Clinic Day:  10/23/2023  Referring physician: Sheryle Carwin, MD  Patient Care Team: Sheryle Carwin, MD as PCP - General (Internal Medicine) Mallipeddi, Diannah SQUIBB, MD as PCP - Cardiology (Cardiology) Rogers Hai, MD as Medical Oncologist (Medical Oncology)   ASSESSMENT & PLAN:   Assessment: 1.  Stage II IgA kappa multiple myeloma, standard risk: -XRT to the spine completed on November 10, 2017 following T9-T10 laminectomy with resection of epidural tumor, T8-T11 posterior fusion by Dr. Louis. -5 cycles of RVD from 11/14/2017 through 02/19/2018 followed by stem cell transplant on April 03, 2018. -Revlimid  maintenance therapy 10 mg 3 weeks on/1 week off started on August 24, 2018. -PET scan on 09/02/2019 showed redemonstrated bilateral cervical, right supraclavicular, right axillary hypermetabolic adenopathy with interval resolution of left axillary adenopathy.  Similar lytic and sclerotic lesions in bones with no corresponding FDG uptake.  Persistent increased FDG uptake in both palatine tonsils. -Reviewed myeloma panel from 09/26/2019.  SPEP and immunofixation is negative.  Free light chain ratio is normal with kappa light chains elevated at 32.5. -Right neck lymph node biopsy on 10/21/2019 at [facility] was negative for malignancy. - PET scan (09/29/2022): Right posterior 10th rib hypermetabolism.  Focal hypermetabolic lesion in the left mid femoral shaft. - XRT to the rib lesion and mid femur lesion, 25 Gray in 10 fractions from 11/03/2022 through 11/16/2022. - Revlimid  dose reduced to 5 mg 3 weeks on/1 week off on 01/31/2023 due to decreased tolerance to 10 mg tablet (decrease in energy, appetite and hair thinning), discontinued on 09/26/2023 due to progression - PET scan (08/24/2023): New lucent bone lesions in the posterior aspect of the left 11th rib and right frontal bone.  New lesions in the right calvarial convexity  and proximal shaft of the left femur. - BMBX (09/13/2023): Hypercellular marrow with 30-40% plasma cells. - FISH panel: T p53 mutation, gain of longer of chromosome 1 (1 q.), monosomy 13 - Cytogenetics: Pending - Dara VD cycle 1 started on 10/02/2023   2.  Myeloma bone disease: -Zometa  was started on 11/21/2017, resumed after transplant on 09/06/2018.    Plan: 1.  Recurrent stage II IgA kappa multiple myeloma, high risk: - She started cycle 1 of daratumumab , Velcade  and dexamethasone  on 10/02/2023.  As she has gotten weaker, I have reduced the dose of Velcade  on day 8 to 1 mg/m.  She is tolerating it well. - She complained of pain in the right orbital area.  PET scan was showing questionable frontal mass.  Hence we have done MRI of the orbits on 09/28/2023 which showed enhancing mass within the orbital roof which extends into the retroconal orbit and presses up on the superior rectus muscle.  Separate circumscribed lesion noted within the sphenoid wing anterolateral to the anterior glenoid. - She started XRT to the orbital lesion (total 13 treatments planned) and left 11th rib (10 fractions) on 10/19/2023. - Reviewed labs today: Normal creatinine, LFTs.  CBC with mild leukopenia with normal ANC.  Myeloma labs are pending. - Proceed with cycle 2 today with dose reduced Velcade  (1 mg/m).  RTC 3 weeks for follow-up for toxicity assessment and response evaluation.   2.  Myeloma bone disease: -Zoledronic  acid was restarted back on 10/02/2023.   3.  Thromboprophylaxis: -Continue aspirin 81 mg daily and acyclovir  twice daily.   4.  Hypertension: -Continue amlodipine , diltiazem  and metoprolol .  Blood pressure today is 112/60.  5.  Peripheral neuropathy: -She has mild on and off tingling in the fingers and feet which is stable since Velcade  was started.  Will closely monitor.  She is not taking gabapentin .    Orders Placed This Encounter  Procedures   Protein electrophoresis, serum    Standing  Status:   Future    Expected Date:   11/14/2023    Expiration Date:   11/13/2024   Kappa/lambda light chains    Standing Status:   Future    Expected Date:   11/14/2023    Expiration Date:   11/13/2024   CBC with Differential    Standing Status:   Future    Expected Date:   11/14/2023    Expiration Date:   11/13/2024   Comprehensive metabolic panel    Standing Status:   Future    Expected Date:   11/14/2023    Expiration Date:   11/13/2024   CBC with Differential    Standing Status:   Future    Expected Date:   11/21/2023    Expiration Date:   11/20/2024   Comprehensive metabolic panel    Standing Status:   Future    Expected Date:   11/21/2023    Expiration Date:   11/20/2024   CBC with Differential    Standing Status:   Future    Expected Date:   11/28/2023    Expiration Date:   11/27/2024      [name],acting as a scribe for Alean Stands, MD.,have documented all relevant documentation on the behalf of Alean Stands, MD,as directed by  Alean Stands, MD while in the presence of Alean Stands, MD.  I, Alean Stands MD, have reviewed the above documentation for accuracy and completeness, and I agree with the above.     Alean Stands, MD   8/4/20253:59 PM  CHIEF COMPLAINT:   Diagnosis: multiple myeloma    Cancer Staging  No matching staging information was found for the patient.    Prior Therapy: 1. Radiation through 11/10/2017. 2. RVD x 5 cycles from 11/14/2017 to 02/19/2018. 3. Stem cell transplant on 04/03/2018.  Maintenance Revlimid  until 09/26/2023  Current Therapy: Dara VD   HISTORY OF PRESENT ILLNESS:   Oncology History  Multiple myeloma (HCC)  10/10/2017 Initial Diagnosis   Multiple myeloma (HCC)   11/14/2017 - 02/26/2018 Chemotherapy   The patient had dexamethasone  (DECADRON ) tablet 40 mg, 40 mg (100 % of original dose 40 mg), Oral,  Once, 1 of 1 cycle Dose modification: 40 mg (original dose 40 mg, Cycle 1) Administration:  40 mg (11/14/2017) dexamethasone  (DECADRON ) 4 MG tablet, 1 of 1 cycle, Start date: 11/02/2017, End date: 03/12/2018 lenalidomide  (REVLIMID ) 25 MG capsule, 1 of 1 cycle, Start date: 02/28/2018, End date: 03/12/2018 bortezomib  SQ (VELCADE ) chemo injection 2.5 mg, 1.3 mg/m2 = 2.5 mg, Subcutaneous,  Once, 5 of 6 cycles Administration: 2.5 mg (11/14/2017), 2.5 mg (11/21/2017), 2.5 mg (11/28/2017), 2.5 mg (12/14/2017), 2.5 mg (12/21/2017), 2.5 mg (12/28/2017), 2.5 mg (01/04/2018), 2.5 mg (01/11/2018), 2.5 mg (01/18/2018), 2.5 mg (01/25/2018), 2.5 mg (02/01/2018), 2.5 mg (02/08/2018), 2.5 mg (02/19/2018), 2.5 mg (02/26/2018)  for chemotherapy treatment.    10/02/2023 -  Chemotherapy   Patient is on Treatment Plan : MYELOMA RELAPSED / REFRACTORY Daratumumab  SQ + Bortezomib  + Dexamethasone  (DaraVd) q21d / Daratumumab  SQ q28d         INTERVAL HISTORY:   Jamie Ewing is a 83 y.o. female presenting to clinic today for follow up of multiple myeloma. She  was last seen by me on 10/09/2023.  Today, she states that she is doing well overall. Her appetite level is at 100%. Her energy level is at 75%. She is accompanied by family members.   PAST MEDICAL HISTORY:   Past Medical History: Past Medical History:  Diagnosis Date   Back pain    Cancer (HCC)    mult myeloma 2019   Hypercholesteremia    Hypertension    Recurrent multiple myeloma of bone marrow with unknown EBV status (HCC)     Surgical History: Past Surgical History:  Procedure Laterality Date   BONE MARROW BIOPSY     LAMINECTOMY N/A 10/06/2017   Procedure: THORACIC NINE AND TEN LAMINECTOMY WITH RESECTION OF TUMOR, THORACIC EIGHT TO THORACIC ELEVEN FUSION WITH PEDICLE SCREW FIXATION;  Surgeon: Louis Shove, MD;  Location: [facility] OR;  Service: Neurosurgery;  Laterality: N/A;   REPLACEMENT TOTAL KNEE Left 2003   TOTAL KNEE ARTHROPLASTY  2001    Social History: Social History   Socioeconomic History   Marital status: Widowed    Spouse name: Not on file    Number of children: 2   Years of education: Not on file   Highest education level: Not on file  Occupational History   Not on file  Tobacco Use   Smoking status: Former    Current packs/day: 0.00    Average packs/day: 0.5 packs/day for 30.0 years (15.0 ttl pk-yrs)    Types: Cigarettes    Start date: 05/30/1976    Quit date: 05/31/2006    Years since quitting: 17.4   Smokeless tobacco: Never  Vaping Use   Vaping status: Never Used  Substance and Sexual Activity   Alcohol use: Yes    Comment: wine occassionally   Drug use: No   Sexual activity: Not on file  Other Topics Concern   Not on file  Social History Narrative   Not on file   Social Drivers of Health   Financial Resource Strain: Low Risk  (10/19/2017)   Overall Financial Resource Strain (CARDIA)    Difficulty of Paying Living Expenses: Not very hard  Food Insecurity: No Food Insecurity (10/11/2023)   Hunger Vital Sign    Worried About Running Out of Food in the Last Year: Never true    Ran Out of Food in the Last Year: Never true  Transportation Needs: No Transportation Needs (10/11/2023)   PRAPARE - Administrator, Civil Service (Medical): No    Lack of Transportation (Non-Medical): No  Physical Activity: Sufficiently Active (10/19/2017)   Exercise Vital Sign    Days of Exercise per Week: 4 days    Minutes of Exercise per Session: 120 min  Stress: No Stress Concern Present (10/19/2017)   [employer] of Occupational Health - Occupational Stress Questionnaire    Feeling of Stress : Not at all  Social Connections: Moderately Integrated (10/19/2017)   Social Connection and Isolation Panel    Frequency of Communication with Friends and Family: More than three times a week    Frequency of Social Gatherings with Friends and Family: Once a week    Attends Religious Services: More than 4 times per year    Active Member of [employer] or Organizations: Yes    Attends Banker Meetings: More than 4 times per  year    Marital Status: Widowed  Intimate Partner Violence: Not At Risk (10/11/2023)   Humiliation, Afraid, Rape, and Kick questionnaire    Fear of Current or Ex-Partner: No  Emotionally Abused: No    Physically Abused: No    Sexually Abused: No    Family History: Family History  Problem Relation Age of Onset   Tuberculosis Mother    Kidney failure Father    Hypertension Paternal Aunt    Diabetes Paternal Aunt    Hypertension Paternal Uncle    Diabetes Paternal Uncle    Hypertension Daughter    Stroke Daughter     Current Medications:  Current Outpatient Medications:    acetaminophen  (TYLENOL ) 500 MG tablet, Take by mouth., Disp: , Rfl:    acyclovir  (ZOVIRAX ) 400 MG tablet, Take 1 tablet (400 mg total) by mouth 2 (two) times daily., Disp: 60 tablet, Rfl: 3   amLODipine  (NORVASC ) 5 MG tablet, Take 5 mg by mouth daily. , Disp: , Rfl:    calcium carbonate (OSCAL) 1500 (600 Ca) MG TABS tablet, Take by mouth., Disp: , Rfl:    dexamethasone  (DECADRON ) 4 MG tablet, Take 1 tablet (4 mg total) by mouth once a week. Take 20 mg by mouth weekly the day after darzalex  injection, Disp: 20 tablet, Rfl: 2   diltiazem  (CARDIZEM  SR) 60 MG 12 hr capsule, Take 1 capsule (60 mg total) by mouth 2 (two) times daily., Disp: 180 capsule, Rfl: 2   gabapentin  (NEURONTIN ) 300 MG capsule, Take 1 capsule (300 mg total) by mouth 2 (two) times daily., Disp: 60 capsule, Rfl: 3   hydrochlorothiazide  (MICROZIDE ) 12.5 MG capsule, Take 1 capsule (12.5 mg total) by mouth daily., Disp: 45 capsule, Rfl: 3   LORazepam (ATIVAN) 0.5 MG tablet, Take 0.25-0.5 mg by mouth 2 (two) times daily as needed for anxiety., Disp: , Rfl:    LOW-DOSE ASPIRIN PO, Asprin Ec Low Dose, Disp: , Rfl:    metoprolol  tartrate (LOPRESSOR ) 25 MG tablet, Take 25 mg by mouth 2 (two) times daily., Disp: , Rfl:    Multiple Vitamins-Minerals (THERA-M) TABS, Thera-M 9 mg iron-400 mcg tablet, Disp: , Rfl:    polyethylene glycol (MIRALAX  / GLYCOLAX )  packet, Take 17 g by mouth as needed. , Disp: , Rfl:    potassium chloride  SA (KLOR-CON  M) 20 MEQ tablet, TAKE 1 TABLET TWICE DAILY, Disp: 180 tablet, Rfl: 3   prochlorperazine  (COMPAZINE ) 10 MG tablet, Take 1 tablet (10 mg total) by mouth every 6 (six) hours as needed., Disp: 60 tablet, Rfl: 2   vitamin B-12 (CYANOCOBALAMIN ) 1000 MCG tablet, Vitamin B12, Disp: , Rfl:    Allergies: No Known Allergies  REVIEW OF SYSTEMS:   Review of Systems  Constitutional:  Negative for chills, fatigue and fever.  HENT:   Negative for lump/mass, mouth sores, nosebleeds, sore throat and trouble swallowing.   Eyes:  Negative for eye problems.  Respiratory:  Negative for cough and shortness of breath.   Cardiovascular:  Negative for chest pain, leg swelling and palpitations.  Gastrointestinal:  Negative for abdominal pain, constipation, diarrhea, nausea and vomiting.  Genitourinary:  Negative for bladder incontinence, difficulty urinating, dysuria, frequency, hematuria and nocturia.   Musculoskeletal:  Negative for arthralgias, back pain, flank pain, myalgias and neck pain.  Skin:  Negative for itching and rash.  Neurological:  Positive for numbness. Negative for dizziness and headaches.  Hematological:  Does not bruise/bleed easily.  Psychiatric/Behavioral:  Negative for depression, sleep disturbance and suicidal ideas. The patient is not nervous/anxious.   All other systems reviewed and are negative.    VITALS:   Blood pressure 112/60, pulse 85, temperature 98.1 F (36.7 C), temperature source Oral, resp. rate  16, weight 154 lb 15.7 oz (70.3 kg), SpO2 100%.  Wt Readings from Last 3 Encounters:  10/23/23 154 lb 15.7 oz (70.3 kg)  10/16/23 154 lb 8 oz (70.1 kg)  10/11/23 157 lb 9.6 oz (71.5 kg)    Body mass index is 27.45 kg/m.  Performance status (ECOG): 1 - Symptomatic but completely ambulatory  PHYSICAL EXAM:   Physical Exam Vitals and nursing note reviewed. Exam conducted with a chaperone  present.  Constitutional:      Appearance: Normal appearance.  Cardiovascular:     Rate and Rhythm: Normal rate and regular rhythm.     Pulses: Normal pulses.     Heart sounds: Normal heart sounds.  Pulmonary:     Effort: Pulmonary effort is normal.     Breath sounds: Normal breath sounds.  Abdominal:     Palpations: Abdomen is soft. There is no hepatomegaly, splenomegaly or mass.     Tenderness: There is no abdominal tenderness.  Musculoskeletal:     Right lower leg: No edema.     Left lower leg: No edema.  Lymphadenopathy:     Cervical: No cervical adenopathy.     Right cervical: No superficial, deep or posterior cervical adenopathy.    Left cervical: No superficial, deep or posterior cervical adenopathy.     Upper Body:     Right upper body: No supraclavicular or axillary adenopathy.     Left upper body: No supraclavicular or axillary adenopathy.  Neurological:     General: No focal deficit present.     Mental Status: She is alert and oriented to person, place, and time.  Psychiatric:        Mood and Affect: Mood normal.        Behavior: Behavior normal.     LABS:   CBC     Component Value Date/Time   WBC 3.6 (L) 10/23/2023 0757   RBC 3.90 10/23/2023 0757   HGB 12.7 10/23/2023 0757   HCT 39.1 10/23/2023 0757   PLT 145 (L) 10/23/2023 0757   MCV 100.3 (H) 10/23/2023 0757   MCH 32.6 10/23/2023 0757   MCHC 32.5 10/23/2023 0757   RDW 15.3 10/23/2023 0757   LYMPHSABS 0.2 (L) 10/23/2023 0757   MONOABS 0.2 10/23/2023 0757   EOSABS 0.0 10/23/2023 0757   BASOSABS 0.0 10/23/2023 0757    CMP      Component Value Date/Time   NA 136 10/23/2023 0757   K 3.8 10/23/2023 0757   CL 99 10/23/2023 0757   CO2 24 10/23/2023 0757   GLUCOSE 145 (H) 10/23/2023 0757   BUN 15 10/23/2023 0757   CREATININE 0.61 10/23/2023 0757   CALCIUM 9.1 10/23/2023 0757   PROT 7.3 10/23/2023 0757   ALBUMIN  3.2 (L) 10/23/2023 0757   AST 17 10/23/2023 0757   ALT 28 10/23/2023 0757   ALKPHOS  85 10/23/2023 0757   BILITOT 0.7 10/23/2023 0757   GFRNONAA >60 10/23/2023 0757   GFRAA >60 11/14/2019 1148     No results found for: CEA1, CEA / No results found for: CEA1, CEA No results found for: PSA1 No results found for: CAN199 No results found for: RJW874  Lab Results  Component Value Date   TOTALPROTELP 8.1 08/07/2023   TOTALPROTELP 8.1 08/07/2023   ALBUMINELP 3.5 08/07/2023   A1GS 0.3 08/07/2023   A2GS 0.9 08/07/2023   BETS 2.2 (H) 08/07/2023   GAMS 1.2 08/07/2023   MSPIKE 0.9 (H) 08/07/2023   SPEI Comment 08/07/2023   Lab Results  Component Value Date   TIBC 334 07/03/2023   TIBC 367 01/24/2023   TIBC 186 (L) 11/14/2017   FERRITIN 149 07/03/2023   FERRITIN 151 01/24/2023   FERRITIN 455 (H) 11/14/2017   IRONPCTSAT 24 07/03/2023   IRONPCTSAT 20 01/24/2023   IRONPCTSAT 20 11/14/2017   Lab Results  Component Value Date   LDH 109 03/03/2022   LDH 112 12/09/2021   LDH 100 09/08/2021     STUDIES:   MR ORBITS W WO CONTRAST Result Date: 09/28/2023 CLINICAL DATA:  vision changes; right eye drooping EXAM: MRI OF THE ORBITS WITHOUT AND WITH CONTRAST TECHNIQUE: Multiplanar, multi-echo pulse sequences of the orbits and surrounding structures were acquired including fat saturation techniques, before and after intravenous contrast administration. CONTRAST:  7mL GADAVIST  GADOBUTROL  1 MMOL/ML IV SOLN COMPARISON:  MRI of the head dated October 06, 2022. FINDINGS: Orbits: There is an expansile, avidly enhancing mass present within the right orbital roof measuring approximately 17 mm in AP diameter, 29 mm in transverse diameter and 20 mm in craniocaudad length. The lesion protrudes into the retrobulbar right orbit and impresses upon the superior rectus muscle. It extends medially within the orbital roof and extends in the lateral aspect of the frontal sinus and superomedial orbital wall. There is also a separate circumscribed lesion within the greater sphenoid wing  anterolateral to the anterior clinoid process. The patient is status post bilateral lens replacement. The optic nerves are normal in morphology and signal intensity. The optic sheaths are nondilated. The extra-axial air muscles are normal in caliber. There is no mass effect upon the optic nerves or optic chiasm. Visualized sinuses: The right frontal sinus is partially opacified. The paranasal sinuses are clear otherwise. Soft tissues: The periorbital soft tissues are unremarkable. Limited intracranial: Mild periventricular white matter disease. Normal appearance of the brain otherwise. IMPRESSION: 1. There is a circumscribed avidly enhancing mass within the orbital roof which extends into the retro conal orbit and impresses upon the superior rectus muscle. A separate circumscribed lesion is noted within the sphenoid wing anterolateral to the anterior clinoid. The findings presumably represent multiple myeloma. Electronically Signed   By: Evalene Coho M.D.   On: 09/28/2023 15:25

## 2023-10-24 ENCOUNTER — Ambulatory Visit
Admission: RE | Admit: 2023-10-24 | Discharge: 2023-10-24 | Disposition: A | Discharge status: Home / Self Care | Source: Ambulatory Visit | Attending: Radiation Oncology | Admitting: Radiation Oncology

## 2023-10-24 ENCOUNTER — Other Ambulatory Visit: Payer: Self-pay

## 2023-10-24 DIAGNOSIS — C7951 Secondary malignant neoplasm of bone: Secondary | ICD-10-CM | POA: Diagnosis not present

## 2023-10-24 DIAGNOSIS — C9000 Multiple myeloma not having achieved remission: Secondary | ICD-10-CM | POA: Diagnosis not present

## 2023-10-24 DIAGNOSIS — Z923 Personal history of irradiation: Secondary | ICD-10-CM | POA: Diagnosis not present

## 2023-10-24 DIAGNOSIS — Z510 Encounter for antineoplastic radiation therapy: Secondary | ICD-10-CM | POA: Diagnosis not present

## 2023-10-24 LAB — RAD ONC ARIA SESSION SUMMARY
Course Elapsed Days: 5
Plan Fractions Treated to Date: 3
Plan Fractions Treated to Date: 4
Plan Prescribed Dose Per Fraction: 2 Gy
Plan Prescribed Dose Per Fraction: 2.5 Gy
Plan Total Fractions Prescribed: 10
Plan Total Fractions Prescribed: 12
Plan Total Prescribed Dose: 24 Gy
Plan Total Prescribed Dose: 25 Gy
Reference Point Dosage Given to Date: 10 Gy
Reference Point Dosage Given to Date: 8 Gy
Reference Point Session Dosage Given: 2 Gy
Reference Point Session Dosage Given: 2.5 Gy
Session Number: 4

## 2023-10-24 LAB — KAPPA/LAMBDA LIGHT CHAINS
Kappa free light chain: 8.8 mg/L (ref 3.3–19.4)
Kappa, lambda light chain ratio: 2.59 — ABNORMAL HIGH (ref 0.26–1.65)
Lambda free light chains: 3.4 mg/L — ABNORMAL LOW (ref 5.7–26.3)

## 2023-10-25 ENCOUNTER — Other Ambulatory Visit: Payer: Self-pay

## 2023-10-25 ENCOUNTER — Ambulatory Visit
Admission: RE | Admit: 2023-10-25 | Discharge: 2023-10-25 | Disposition: A | Discharge status: Home / Self Care | Source: Ambulatory Visit | Attending: Radiation Oncology | Admitting: Radiation Oncology

## 2023-10-25 DIAGNOSIS — Z510 Encounter for antineoplastic radiation therapy: Secondary | ICD-10-CM | POA: Diagnosis not present

## 2023-10-25 DIAGNOSIS — Z923 Personal history of irradiation: Secondary | ICD-10-CM | POA: Diagnosis not present

## 2023-10-25 DIAGNOSIS — C9000 Multiple myeloma not having achieved remission: Secondary | ICD-10-CM | POA: Diagnosis not present

## 2023-10-25 DIAGNOSIS — C7951 Secondary malignant neoplasm of bone: Secondary | ICD-10-CM | POA: Diagnosis not present

## 2023-10-25 LAB — PROTEIN ELECTROPHORESIS, SERUM
A/G Ratio: 0.9 (ref 0.7–1.7)
Albumin ELP: 3.2 g/dL (ref 2.9–4.4)
Alpha-1-Globulin: 0.2 g/dL (ref 0.0–0.4)
Alpha-2-Globulin: 1.1 g/dL — ABNORMAL HIGH (ref 0.4–1.0)
Beta Globulin: 1.8 g/dL — ABNORMAL HIGH (ref 0.7–1.3)
Gamma Globulin: 0.5 g/dL (ref 0.4–1.8)
Globulin, Total: 3.6 g/dL (ref 2.2–3.9)
M-Spike, %: 0.9 g/dL — ABNORMAL HIGH
Total Protein ELP: 6.8 g/dL (ref 6.0–8.5)

## 2023-10-25 LAB — RAD ONC ARIA SESSION SUMMARY
Course Elapsed Days: 6
Plan Fractions Treated to Date: 4
Plan Fractions Treated to Date: 5
Plan Prescribed Dose Per Fraction: 2 Gy
Plan Prescribed Dose Per Fraction: 2.5 Gy
Plan Total Fractions Prescribed: 10
Plan Total Fractions Prescribed: 12
Plan Total Prescribed Dose: 24 Gy
Plan Total Prescribed Dose: 25 Gy
Reference Point Dosage Given to Date: 10 Gy
Reference Point Dosage Given to Date: 12.5 Gy
Reference Point Session Dosage Given: 2 Gy
Reference Point Session Dosage Given: 2.5 Gy
Session Number: 5

## 2023-10-26 ENCOUNTER — Inpatient Hospital Stay

## 2023-10-26 ENCOUNTER — Ambulatory Visit
Admission: RE | Admit: 2023-10-26 | Discharge: 2023-10-26 | Disposition: A | Discharge status: Home / Self Care | Source: Ambulatory Visit | Attending: Radiation Oncology | Admitting: Radiation Oncology

## 2023-10-26 ENCOUNTER — Other Ambulatory Visit: Payer: Self-pay

## 2023-10-26 VITALS — BP 118/56 | HR 83 | Temp 96.2°F | Resp 20 | Wt 155.2 lb

## 2023-10-26 DIAGNOSIS — C7951 Secondary malignant neoplasm of bone: Secondary | ICD-10-CM | POA: Diagnosis not present

## 2023-10-26 DIAGNOSIS — Z510 Encounter for antineoplastic radiation therapy: Secondary | ICD-10-CM | POA: Diagnosis not present

## 2023-10-26 DIAGNOSIS — C9000 Multiple myeloma not having achieved remission: Secondary | ICD-10-CM | POA: Diagnosis not present

## 2023-10-26 DIAGNOSIS — Z923 Personal history of irradiation: Secondary | ICD-10-CM | POA: Diagnosis not present

## 2023-10-26 LAB — RAD ONC ARIA SESSION SUMMARY
Course Elapsed Days: 7
Plan Fractions Treated to Date: 5
Plan Fractions Treated to Date: 6
Plan Prescribed Dose Per Fraction: 2 Gy
Plan Prescribed Dose Per Fraction: 2.5 Gy
Plan Total Fractions Prescribed: 10
Plan Total Fractions Prescribed: 12
Plan Total Prescribed Dose: 24 Gy
Plan Total Prescribed Dose: 25 Gy
Reference Point Dosage Given to Date: 12 Gy
Reference Point Dosage Given to Date: 15 Gy
Reference Point Session Dosage Given: 2 Gy
Reference Point Session Dosage Given: 2.5 Gy
Session Number: 6

## 2023-10-26 MED ORDER — BORTEZOMIB CHEMO SQ INJECTION 3.5 MG (2.5MG/ML)
1.0000 mg/m2 | Freq: Once | INTRAMUSCULAR | Status: AC
  Administered 2023-10-26: 1.75 mg via SUBCUTANEOUS
  Filled 2023-10-26: qty 0.7

## 2023-10-26 MED ORDER — DEXAMETHASONE 4 MG PO TABS
20.0000 mg | ORAL_TABLET | Freq: Once | ORAL | Status: AC
  Administered 2023-10-26: 20 mg via ORAL
  Filled 2023-10-26: qty 5

## 2023-10-26 NOTE — Patient Instructions (Signed)
 [facility] [place] - A DEPT OF [facility]  Discharge Instructions: Thank you for choosing [facility] to provide your oncology and hematology care.  If you have a lab appointment with the Cancer Center - please note that after April 8th, 2024, all labs will be drawn in the cancer center.  You do not have to check in or register with the main entrance as you have in the past but will complete your check-in in the cancer center.  Wear comfortable clothing and clothing appropriate for easy access to any Portacath or PICC line.   We strive to give you quality time with your provider. You may need to reschedule your appointment if you arrive late (15 or more minutes).  Arriving late affects you and other patients whose appointments are after yours.  Also, if you miss three or more appointments without notifying the office, you may be dismissed from the clinic at the provider's discretion.      For prescription refill requests, have your pharmacy contact our office and allow 72 hours for refills to be completed.    Today you received the following chemotherapy and/or immunotherapy agents Velcade, return as scheduled.   To help prevent nausea and vomiting after your treatment, we encourage you to take your nausea medication as directed.  BELOW ARE SYMPTOMS THAT SHOULD BE REPORTED IMMEDIATELY: *FEVER GREATER THAN 100.4 F (38 C) OR HIGHER *CHILLS OR SWEATING *NAUSEA AND VOMITING THAT IS NOT CONTROLLED WITH YOUR NAUSEA MEDICATION *UNUSUAL SHORTNESS OF BREATH *UNUSUAL BRUISING OR BLEEDING *URINARY PROBLEMS (pain or burning when urinating, or frequent urination) *BOWEL PROBLEMS (unusual diarrhea, constipation, pain near the anus) TENDERNESS IN MOUTH AND THROAT WITH OR WITHOUT PRESENCE OF ULCERS (sore throat, sores in mouth, or a toothache) UNUSUAL RASH, SWELLING OR PAIN  UNUSUAL VAGINAL DISCHARGE OR ITCHING   Items with * indicate a potential emergency and  should be followed up as soon as possible or go to the Emergency Department if any problems should occur.  Please show the CHEMOTHERAPY ALERT CARD or IMMUNOTHERAPY ALERT CARD at check-in to the Emergency Department and triage nurse.  Should you have questions after your visit or need to cancel or reschedule your appointment, please contact [facility] [place] - A DEPT OF [facility] [phone]  and follow the prompts.  Office hours are 8:00 a.m. to 4:30 p.m. Monday - Friday. Please note that voicemails left after 4:00 p.m. may not be returned until the following business day.  We are closed weekends and major holidays. You have access to a nurse at all times for urgent questions. Please call the main number to the clinic [phone] and follow the prompts.  For any non-urgent questions, you may also contact your provider using MyChart. We now offer e-Visits for anyone 68 and older to request care online for non-urgent symptoms. For details visit [email or web address].   Also download the MyChart app! Go to the app store, search "MyChart", open the app, select [place], and log in with your MyChart username and password.

## 2023-10-26 NOTE — Progress Notes (Signed)
 Patient tolerated Velcade injection with no complaints voiced.  Lab work reviewed.  See MAR for details.  Injection site clean and dry with no bruising or swelling noted.  Patient stable during and after injection.  Band aid applied.  VSS.  Patient left in satisfactory condition with no s/s of distress noted.

## 2023-10-27 ENCOUNTER — Other Ambulatory Visit: Payer: Self-pay

## 2023-10-27 ENCOUNTER — Ambulatory Visit
Admission: RE | Admit: 2023-10-27 | Discharge: 2023-10-27 | Disposition: A | Discharge status: Home / Self Care | Source: Ambulatory Visit | Attending: Radiation Oncology | Admitting: Radiation Oncology

## 2023-10-27 DIAGNOSIS — C9000 Multiple myeloma not having achieved remission: Secondary | ICD-10-CM | POA: Diagnosis not present

## 2023-10-27 DIAGNOSIS — Z923 Personal history of irradiation: Secondary | ICD-10-CM | POA: Diagnosis not present

## 2023-10-27 DIAGNOSIS — Z510 Encounter for antineoplastic radiation therapy: Secondary | ICD-10-CM | POA: Diagnosis not present

## 2023-10-27 DIAGNOSIS — C7951 Secondary malignant neoplasm of bone: Secondary | ICD-10-CM | POA: Diagnosis not present

## 2023-10-27 LAB — RAD ONC ARIA SESSION SUMMARY
Course Elapsed Days: 8
Plan Fractions Treated to Date: 6
Plan Fractions Treated to Date: 7
Plan Prescribed Dose Per Fraction: 2 Gy
Plan Prescribed Dose Per Fraction: 2.5 Gy
Plan Total Fractions Prescribed: 10
Plan Total Fractions Prescribed: 12
Plan Total Prescribed Dose: 24 Gy
Plan Total Prescribed Dose: 25 Gy
Reference Point Dosage Given to Date: 14 Gy
Reference Point Dosage Given to Date: 17.5 Gy
Reference Point Session Dosage Given: 2 Gy
Reference Point Session Dosage Given: 2.5 Gy
Session Number: 7

## 2023-10-28 ENCOUNTER — Other Ambulatory Visit: Payer: Self-pay

## 2023-10-30 ENCOUNTER — Inpatient Hospital Stay

## 2023-10-30 ENCOUNTER — Other Ambulatory Visit: Payer: Self-pay

## 2023-10-30 ENCOUNTER — Ambulatory Visit
Admission: RE | Admit: 2023-10-30 | Discharge: 2023-10-30 | Disposition: A | Discharge status: Home / Self Care | Source: Ambulatory Visit | Attending: Radiation Oncology | Admitting: Radiation Oncology

## 2023-10-30 VITALS — BP 125/67 | HR 93 | Temp 97.7°F | Resp 18 | Wt 152.0 lb

## 2023-10-30 DIAGNOSIS — C9000 Multiple myeloma not having achieved remission: Secondary | ICD-10-CM

## 2023-10-30 DIAGNOSIS — Z510 Encounter for antineoplastic radiation therapy: Secondary | ICD-10-CM | POA: Diagnosis not present

## 2023-10-30 DIAGNOSIS — C7951 Secondary malignant neoplasm of bone: Secondary | ICD-10-CM | POA: Diagnosis not present

## 2023-10-30 DIAGNOSIS — Z923 Personal history of irradiation: Secondary | ICD-10-CM | POA: Diagnosis not present

## 2023-10-30 LAB — COMPREHENSIVE METABOLIC PANEL WITH GFR
ALT: 31 U/L (ref 0–44)
AST: 18 U/L (ref 15–41)
Albumin: 3.2 g/dL — ABNORMAL LOW (ref 3.5–5.0)
Alkaline Phosphatase: 89 U/L (ref 38–126)
Anion gap: 9 (ref 5–15)
BUN: 21 mg/dL (ref 8–23)
CO2: 23 mmol/L (ref 22–32)
Calcium: 9.2 mg/dL (ref 8.9–10.3)
Chloride: 100 mmol/L (ref 98–111)
Creatinine, Ser: 0.73 mg/dL (ref 0.44–1.00)
GFR, Estimated: >60 mL/min (ref >60)
Glucose, Bld: 109 mg/dL — ABNORMAL HIGH (ref 70–99)
Potassium: 4.5 mmol/L (ref 3.5–5.1)
Sodium: 132 mmol/L — ABNORMAL LOW (ref 135–145)
Total Bilirubin: 0.8 mg/dL (ref 0.0–1.2)
Total Protein: 6.9 g/dL (ref 6.5–8.1)

## 2023-10-30 LAB — CBC WITH DIFFERENTIAL/PLATELET
Abs Immature Granulocytes: 0.03 K/uL (ref 0.00–0.07)
Basophils Absolute: 0 K/uL (ref 0.0–0.1)
Basophils Relative: 0 %
Eosinophils Absolute: 0 K/uL (ref 0.0–0.5)
Eosinophils Relative: 0 %
HCT: 41.6 % (ref 36.0–46.0)
Hemoglobin: 14.1 g/dL (ref 12.0–15.0)
Immature Granulocytes: 1 %
Lymphocytes Relative: 10 %
Lymphs Abs: 0.3 K/uL — ABNORMAL LOW (ref 0.7–4.0)
MCH: 33.3 pg (ref 26.0–34.0)
MCHC: 33.9 g/dL (ref 30.0–36.0)
MCV: 98.3 fL (ref 80.0–100.0)
Monocytes Absolute: 0.3 K/uL (ref 0.1–1.0)
Monocytes Relative: 10 %
Neutro Abs: 2.3 K/uL (ref 1.7–7.7)
Neutrophils Relative %: 79 %
Platelets: 92 K/uL — ABNORMAL LOW (ref 150–400)
RBC: 4.23 MIL/uL (ref 3.87–5.11)
RDW: 15.5 % (ref 11.5–15.5)
WBC: 2.9 K/uL — ABNORMAL LOW (ref 4.0–10.5)
nRBC: 0 % (ref 0.0–0.2)

## 2023-10-30 LAB — RAD ONC ARIA SESSION SUMMARY
Course Elapsed Days: 11
Plan Fractions Treated to Date: 7
Plan Fractions Treated to Date: 8
Plan Prescribed Dose Per Fraction: 2 Gy
Plan Prescribed Dose Per Fraction: 2.5 Gy
Plan Total Fractions Prescribed: 10
Plan Total Fractions Prescribed: 12
Plan Total Prescribed Dose: 24 Gy
Plan Total Prescribed Dose: 25 Gy
Reference Point Dosage Given to Date: 16 Gy
Reference Point Dosage Given to Date: 20 Gy
Reference Point Session Dosage Given: 2 Gy
Reference Point Session Dosage Given: 2.5 Gy
Session Number: 8

## 2023-10-30 MED ORDER — CETIRIZINE HCL 10 MG PO TABS
10.0000 mg | ORAL_TABLET | Freq: Once | ORAL | Status: AC
  Administered 2023-10-30 (×2): 10 mg via ORAL
  Filled 2023-10-30: qty 1

## 2023-10-30 MED ORDER — ACETAMINOPHEN 325 MG PO TABS
650.0000 mg | ORAL_TABLET | Freq: Once | ORAL | Status: AC
  Administered 2023-10-30 (×2): 650 mg via ORAL
  Filled 2023-10-30: qty 2

## 2023-10-30 MED ORDER — SODIUM CHLORIDE 0.9 % IV SOLN
Freq: Once | INTRAVENOUS | Status: AC
Start: 2023-10-30 — End: 2023-10-30

## 2023-10-30 MED ORDER — ZOLEDRONIC ACID 4 MG/100ML IV SOLN
4.0000 mg | Freq: Once | INTRAVENOUS | Status: AC
  Administered 2023-10-30 (×2): 4 mg via INTRAVENOUS
  Filled 2023-10-30: qty 100

## 2023-10-30 MED ORDER — DARATUMUMAB-HYALURONIDASE-FIHJ 1800-30000 MG-UT/15ML ~~LOC~~ SOLN
1800.0000 mg | Freq: Once | SUBCUTANEOUS | Status: AC
  Administered 2023-10-30 (×2): 1800 mg via SUBCUTANEOUS
  Filled 2023-10-30: qty 15

## 2023-10-30 MED ORDER — BORTEZOMIB CHEMO SQ INJECTION 3.5 MG (2.5MG/ML)
1.0000 mg/m2 | Freq: Once | INTRAMUSCULAR | Status: AC
  Administered 2023-10-30 (×2): 1.75 mg via SUBCUTANEOUS
  Filled 2023-10-30: qty 0.7

## 2023-10-30 MED ORDER — SODIUM CHLORIDE 0.9 % IV SOLN: INTRAVENOUS | Status: DC

## 2023-10-30 MED ORDER — DEXAMETHASONE 4 MG PO TABS
20.0000 mg | ORAL_TABLET | Freq: Once | ORAL | Status: AC
  Administered 2023-10-30 (×2): 20 mg via ORAL
  Filled 2023-10-30: qty 5

## 2023-10-30 NOTE — Patient Instructions (Signed)
 [facility] [place] - A DEPT OF . [facility]  Discharge Instructions: Thank you for choosing [facility] to provide your oncology and hematology care.  If you have a lab appointment with the Cancer Center - please note that after April 8th, 2024, all labs will be drawn in the cancer center.  You do not have to check in or register with the main entrance as you have in the past but will complete your check-in in the cancer center.  Wear comfortable clothing and clothing appropriate for easy access to any Portacath or PICC line.   We strive to give you quality time with your provider. You may need to reschedule your appointment if you arrive late (15 or more minutes).  Arriving late affects you and other patients whose appointments are after yours.  Also, if you miss three or more appointments without notifying the office, you may be dismissed from the clinic at the provider's discretion.      For prescription refill requests, have your pharmacy contact our office and allow 72 hours for refills to be completed.    Today you received the following chemotherapy and/or immunotherapy agents darzalex  and velcade .       To help prevent nausea and vomiting after your treatment, we encourage you to take your nausea medication as directed.  BELOW ARE SYMPTOMS THAT SHOULD BE REPORTED IMMEDIATELY: *FEVER GREATER THAN 100.4 F (38 C) OR HIGHER *CHILLS OR SWEATING *NAUSEA AND VOMITING THAT IS NOT CONTROLLED WITH YOUR NAUSEA MEDICATION *UNUSUAL SHORTNESS OF BREATH *UNUSUAL BRUISING OR BLEEDING *URINARY PROBLEMS (pain or burning when urinating, or frequent urination) *BOWEL PROBLEMS (unusual diarrhea, constipation, pain near the anus) TENDERNESS IN MOUTH AND THROAT WITH OR WITHOUT PRESENCE OF ULCERS (sore throat, sores in mouth, or a toothache) UNUSUAL RASH, SWELLING OR PAIN  UNUSUAL VAGINAL DISCHARGE OR ITCHING   Items with * indicate a potential emergency and should  be followed up as soon as possible or go to the Emergency Department if any problems should occur.  Please show the CHEMOTHERAPY ALERT CARD or IMMUNOTHERAPY ALERT CARD at check-in to the Emergency Department and triage nurse.  Should you have questions after your visit or need to cancel or reschedule your appointment, please contact [facility] [place] - A DEPT OF [facility] [phone]  and follow the prompts.  Office hours are 8:00 a.m. to 4:30 p.m. Monday - Friday. Please note that voicemails left after 4:00 p.m. may not be returned until the following business day.  We are closed weekends and major holidays. You have access to a nurse at all times for urgent questions. Please call the main number to the clinic [phone] and follow the prompts.  For any non-urgent questions, you may also contact your provider using MyChart. We now offer e-Visits for anyone 49 and older to request care online for non-urgent symptoms. For details visit [email or web address].   Also download the MyChart app! Go to the app store, search "MyChart", open the app, select [facility], and log in with your MyChart username and password.

## 2023-10-30 NOTE — Progress Notes (Signed)
Patient tolerated Daratumumab and Velcade injection with no complaints voiced.  See MAR for details.  Labs reviewed. Injection site clean and dry with no bruising or swelling noted at site.  Band aid applied.  Vss with discharge and left in satisfactory condition with no s/s of distress noted.

## 2023-10-30 NOTE — Progress Notes (Signed)
 OK to proceed with labs - platelets 92K  V.O. Dr Ivery Molt, PharmD

## 2023-10-31 ENCOUNTER — Other Ambulatory Visit: Payer: Self-pay

## 2023-10-31 ENCOUNTER — Ambulatory Visit
Admission: RE | Admit: 2023-10-31 | Discharge: 2023-10-31 | Disposition: A | Discharge status: Home / Self Care | Source: Ambulatory Visit | Attending: Radiation Oncology | Admitting: Radiation Oncology

## 2023-10-31 DIAGNOSIS — Z510 Encounter for antineoplastic radiation therapy: Secondary | ICD-10-CM | POA: Diagnosis not present

## 2023-10-31 DIAGNOSIS — C9000 Multiple myeloma not having achieved remission: Secondary | ICD-10-CM | POA: Diagnosis not present

## 2023-10-31 DIAGNOSIS — Z923 Personal history of irradiation: Secondary | ICD-10-CM | POA: Diagnosis not present

## 2023-10-31 DIAGNOSIS — C7951 Secondary malignant neoplasm of bone: Secondary | ICD-10-CM | POA: Diagnosis not present

## 2023-10-31 LAB — RAD ONC ARIA SESSION SUMMARY
Course Elapsed Days: 12
Plan Fractions Treated to Date: 8
Plan Fractions Treated to Date: 9
Plan Prescribed Dose Per Fraction: 2 Gy
Plan Prescribed Dose Per Fraction: 2.5 Gy
Plan Total Fractions Prescribed: 10
Plan Total Fractions Prescribed: 12
Plan Total Prescribed Dose: 24 Gy
Plan Total Prescribed Dose: 25 Gy
Reference Point Dosage Given to Date: 18 Gy
Reference Point Dosage Given to Date: 22.5 Gy
Reference Point Session Dosage Given: 2 Gy
Reference Point Session Dosage Given: 2.5 Gy
Session Number: 9

## 2023-10-31 LAB — KAPPA/LAMBDA LIGHT CHAINS
Kappa free light chain: 7.2 mg/L (ref 3.3–19.4)
Kappa, lambda light chain ratio: 2.67 — ABNORMAL HIGH (ref 0.26–1.65)
Lambda free light chains: 2.7 mg/L — ABNORMAL LOW (ref 5.7–26.3)

## 2023-11-01 ENCOUNTER — Other Ambulatory Visit: Payer: Self-pay

## 2023-11-01 ENCOUNTER — Ambulatory Visit
Admission: RE | Admit: 2023-11-01 | Discharge: 2023-11-01 | Disposition: A | Discharge status: Home / Self Care | Source: Ambulatory Visit | Attending: Radiation Oncology | Admitting: Radiation Oncology

## 2023-11-01 DIAGNOSIS — C9000 Multiple myeloma not having achieved remission: Secondary | ICD-10-CM | POA: Diagnosis not present

## 2023-11-01 DIAGNOSIS — C7951 Secondary malignant neoplasm of bone: Secondary | ICD-10-CM | POA: Diagnosis not present

## 2023-11-01 DIAGNOSIS — Z923 Personal history of irradiation: Secondary | ICD-10-CM | POA: Diagnosis not present

## 2023-11-01 DIAGNOSIS — Z510 Encounter for antineoplastic radiation therapy: Secondary | ICD-10-CM | POA: Diagnosis not present

## 2023-11-01 LAB — PROTEIN ELECTROPHORESIS, SERUM
A/G Ratio: 0.9 (ref 0.7–1.7)
Albumin ELP: 3.1 g/dL (ref 2.9–4.4)
Alpha-1-Globulin: 0.2 g/dL (ref 0.0–0.4)
Alpha-2-Globulin: 1.1 g/dL — ABNORMAL HIGH (ref 0.4–1.0)
Beta Globulin: 1.6 g/dL — ABNORMAL HIGH (ref 0.7–1.3)
Gamma Globulin: 0.5 g/dL (ref 0.4–1.8)
Globulin, Total: 3.4 g/dL (ref 2.2–3.9)
M-Spike, %: 0.7 g/dL — ABNORMAL HIGH
Total Protein ELP: 6.5 g/dL (ref 6.0–8.5)

## 2023-11-01 LAB — RAD ONC ARIA SESSION SUMMARY
Course Elapsed Days: 13
Plan Fractions Treated to Date: 10
Plan Fractions Treated to Date: 9
Plan Prescribed Dose Per Fraction: 2 Gy
Plan Prescribed Dose Per Fraction: 2.5 Gy
Plan Total Fractions Prescribed: 10
Plan Total Fractions Prescribed: 12
Plan Total Prescribed Dose: 24 Gy
Plan Total Prescribed Dose: 25 Gy
Reference Point Dosage Given to Date: 20 Gy
Reference Point Dosage Given to Date: 25 Gy
Reference Point Session Dosage Given: 2 Gy
Reference Point Session Dosage Given: 2.5 Gy
Session Number: 10

## 2023-11-02 ENCOUNTER — Other Ambulatory Visit: Payer: Self-pay

## 2023-11-02 ENCOUNTER — Ambulatory Visit
Admission: RE | Admit: 2023-11-02 | Discharge: 2023-11-02 | Disposition: A | Discharge status: Home / Self Care | Source: Ambulatory Visit | Attending: Radiation Oncology | Admitting: Radiation Oncology

## 2023-11-02 ENCOUNTER — Inpatient Hospital Stay

## 2023-11-02 VITALS — BP 123/76 | HR 85 | Temp 97.6°F | Resp 18 | Wt 151.0 lb

## 2023-11-02 DIAGNOSIS — C9000 Multiple myeloma not having achieved remission: Secondary | ICD-10-CM

## 2023-11-02 DIAGNOSIS — Z510 Encounter for antineoplastic radiation therapy: Secondary | ICD-10-CM | POA: Diagnosis not present

## 2023-11-02 DIAGNOSIS — C7951 Secondary malignant neoplasm of bone: Secondary | ICD-10-CM | POA: Diagnosis not present

## 2023-11-02 DIAGNOSIS — Z923 Personal history of irradiation: Secondary | ICD-10-CM | POA: Diagnosis not present

## 2023-11-02 LAB — RAD ONC ARIA SESSION SUMMARY
Course Elapsed Days: 14
Plan Fractions Treated to Date: 10
Plan Prescribed Dose Per Fraction: 2 Gy
Plan Total Fractions Prescribed: 12
Plan Total Prescribed Dose: 24 Gy
Reference Point Dosage Given to Date: 22 Gy
Reference Point Session Dosage Given: 2 Gy
Session Number: 11

## 2023-11-02 MED ORDER — DEXAMETHASONE 4 MG PO TABS
20.0000 mg | ORAL_TABLET | Freq: Once | ORAL | Status: AC
  Administered 2023-11-02: 20 mg via ORAL
  Filled 2023-11-02: qty 5

## 2023-11-02 MED ORDER — BORTEZOMIB CHEMO SQ INJECTION 3.5 MG (2.5MG/ML)
1.0000 mg/m2 | Freq: Once | INTRAMUSCULAR | Status: AC
  Administered 2023-11-02: 1.75 mg via SUBCUTANEOUS
  Filled 2023-11-02: qty 0.7

## 2023-11-02 NOTE — Progress Notes (Signed)
 Patient presents today for Velcade  injection. Labs drawn on 10/30/2023. Patient denies any side effects related to last treatment.

## 2023-11-02 NOTE — Patient Instructions (Signed)
 [facility] [place] - A DEPT OF [facility]  Discharge Instructions: Thank you for choosing [facility] to provide your oncology and hematology care.  If you have a lab appointment with the Cancer Center - please note that after April 8th, 2024, all labs will be drawn in the cancer center.  You do not have to check in or register with the main entrance as you have in the past but will complete your check-in in the cancer center.  Wear comfortable clothing and clothing appropriate for easy access to any Portacath or PICC line.   We strive to give you quality time with your provider. You may need to reschedule your appointment if you arrive late (15 or more minutes).  Arriving late affects you and other patients whose appointments are after yours.  Also, if you miss three or more appointments without notifying the office, you may be dismissed from the clinic at the provider's discretion.      For prescription refill requests, have your pharmacy contact our office and allow 72 hours for refills to be completed.    Today you received the following chemotherapy and/or immunotherapy agents velcade .       To help prevent nausea and vomiting after your treatment, we encourage you to take your nausea medication as directed.  BELOW ARE SYMPTOMS THAT SHOULD BE REPORTED IMMEDIATELY: *FEVER GREATER THAN 100.4 F (38 C) OR HIGHER *CHILLS OR SWEATING *NAUSEA AND VOMITING THAT IS NOT CONTROLLED WITH YOUR NAUSEA MEDICATION *UNUSUAL SHORTNESS OF BREATH *UNUSUAL BRUISING OR BLEEDING *URINARY PROBLEMS (pain or burning when urinating, or frequent urination) *BOWEL PROBLEMS (unusual diarrhea, constipation, pain near the anus) TENDERNESS IN MOUTH AND THROAT WITH OR WITHOUT PRESENCE OF ULCERS (sore throat, sores in mouth, or a toothache) UNUSUAL RASH, SWELLING OR PAIN  UNUSUAL VAGINAL DISCHARGE OR ITCHING   Items with * indicate a potential emergency and should be followed up  as soon as possible or go to the Emergency Department if any problems should occur.  Please show the CHEMOTHERAPY ALERT CARD or IMMUNOTHERAPY ALERT CARD at check-in to the Emergency Department and triage nurse.  Should you have questions after your visit or need to cancel or reschedule your appointment, please contact [facility] [place] - A DEPT OF [facility] [phone]  and follow the prompts.  Office hours are 8:00 a.m. to 4:30 p.m. Monday - Friday. Please note that voicemails left after 4:00 p.m. may not be returned until the following business day.  We are closed weekends and major holidays. You have access to a nurse at all times for urgent questions. Please call the main number to the clinic [phone] and follow the prompts.  For any non-urgent questions, you may also contact your provider using MyChart. We now offer e-Visits for anyone 8 and older to request care online for non-urgent symptoms. For details visit [email or web address].   Also download the MyChart app! Go to the app store, search MyChart, open the app, select [facility], and log in with your MyChart username and password.

## 2023-11-02 NOTE — Progress Notes (Signed)
 Patient tolerated injection with no complaints voiced.  Site clean and dry with no bruising or swelling noted at site.  See MAR for details.  Band aid applied.  Patient stable during and after injection.  Vss with discharge and left in satisfactory condition with no s/s of distress noted.

## 2023-11-03 ENCOUNTER — Ambulatory Visit
Admission: RE | Admit: 2023-11-03 | Discharge: 2023-11-03 | Disposition: A | Discharge status: Home / Self Care | Source: Ambulatory Visit | Attending: Radiation Oncology | Admitting: Radiation Oncology

## 2023-11-03 ENCOUNTER — Other Ambulatory Visit: Payer: Self-pay

## 2023-11-03 DIAGNOSIS — C7951 Secondary malignant neoplasm of bone: Secondary | ICD-10-CM | POA: Diagnosis not present

## 2023-11-03 DIAGNOSIS — Z510 Encounter for antineoplastic radiation therapy: Secondary | ICD-10-CM | POA: Diagnosis not present

## 2023-11-03 DIAGNOSIS — Z923 Personal history of irradiation: Secondary | ICD-10-CM | POA: Diagnosis not present

## 2023-11-03 DIAGNOSIS — C9000 Multiple myeloma not having achieved remission: Secondary | ICD-10-CM | POA: Diagnosis not present

## 2023-11-03 LAB — RAD ONC ARIA SESSION SUMMARY
Course Elapsed Days: 15
Plan Fractions Treated to Date: 11
Plan Prescribed Dose Per Fraction: 2 Gy
Plan Total Fractions Prescribed: 12
Plan Total Prescribed Dose: 24 Gy
Reference Point Dosage Given to Date: 24 Gy
Reference Point Session Dosage Given: 2 Gy
Session Number: 12

## 2023-11-06 ENCOUNTER — Encounter: Payer: Self-pay | Admitting: Oncology

## 2023-11-06 ENCOUNTER — Inpatient Hospital Stay

## 2023-11-06 ENCOUNTER — Ambulatory Visit
Admission: RE | Admit: 2023-11-06 | Discharge: 2023-11-06 | Disposition: A | Discharge status: Home / Self Care | Source: Ambulatory Visit | Attending: Radiation Oncology | Admitting: Radiation Oncology

## 2023-11-06 ENCOUNTER — Other Ambulatory Visit: Payer: Self-pay

## 2023-11-06 ENCOUNTER — Inpatient Hospital Stay: Admitting: Dietician

## 2023-11-06 DIAGNOSIS — C9000 Multiple myeloma not having achieved remission: Secondary | ICD-10-CM

## 2023-11-06 DIAGNOSIS — Z923 Personal history of irradiation: Secondary | ICD-10-CM | POA: Diagnosis not present

## 2023-11-06 DIAGNOSIS — C7951 Secondary malignant neoplasm of bone: Secondary | ICD-10-CM | POA: Diagnosis not present

## 2023-11-06 DIAGNOSIS — Z510 Encounter for antineoplastic radiation therapy: Secondary | ICD-10-CM | POA: Diagnosis not present

## 2023-11-06 LAB — CBC WITH DIFFERENTIAL/PLATELET
Abs Immature Granulocytes: 0.02 K/uL (ref 0.00–0.07)
Basophils Absolute: 0 K/uL (ref 0.0–0.1)
Basophils Relative: 0 %
Eosinophils Absolute: 0 K/uL (ref 0.0–0.5)
Eosinophils Relative: 0 %
HCT: 38.1 % (ref 36.0–46.0)
Hemoglobin: 12.8 g/dL (ref 12.0–15.0)
Immature Granulocytes: 0 %
Lymphocytes Relative: 3 %
Lymphs Abs: 0.2 K/uL — ABNORMAL LOW (ref 0.7–4.0)
MCH: 32.2 pg (ref 26.0–34.0)
MCHC: 33.6 g/dL (ref 30.0–36.0)
MCV: 95.7 fL (ref 80.0–100.0)
Monocytes Absolute: 0.3 K/uL (ref 0.1–1.0)
Monocytes Relative: 6 %
Neutro Abs: 4.7 K/uL (ref 1.7–7.7)
Neutrophils Relative %: 91 %
Platelets: 34 K/uL — ABNORMAL LOW (ref 150–400)
RBC: 3.98 MIL/uL (ref 3.87–5.11)
RDW: 15.2 % (ref 11.5–15.5)
WBC: 5.2 K/uL (ref 4.0–10.5)
nRBC: 0.4 % — ABNORMAL HIGH (ref 0.0–0.2)

## 2023-11-06 LAB — RAD ONC ARIA SESSION SUMMARY
Course Elapsed Days: 18
Plan Fractions Treated to Date: 12
Plan Prescribed Dose Per Fraction: 2 Gy
Plan Total Fractions Prescribed: 12
Plan Total Prescribed Dose: 24 Gy
Reference Point Dosage Given to Date: 26 Gy
Reference Point Session Dosage Given: 2 Gy
Session Number: 13

## 2023-11-06 NOTE — Progress Notes (Signed)
 Platelets 34 today with oncologist notified.  Hold treatment today and reschedule for next week verbal order Dr. Katragadda.

## 2023-11-06 NOTE — Progress Notes (Signed)
 Nutrition Assessment   Reason for Assessment: Referral (wt loss)   ASSESSMENT: 83 year old female with multiple myeloma not having achieved remission. S/p stem cell transplant (2020) followed by maintenance revlimid  (last 09/26/23). Patient completed radiation to orbital lesion and left 11th rib (final RT 8/18). She is receiving DaraVd q28d.   Past medical history includes moderate aortic stenosis, HTN, severe left ventricular hypertrophy, thoracic stenosis  Treatment held today d/t labs  Met with patient in infusion. She is feeling exhausted this afternoon. Patient is glad to be finished with radiation. She is unclear why treatment was held. Granddaughter arrived to take patient home during visit. Patient reports poor appetite due to significant fatigue. Even eating makes her tired. Patient drinks a Boost with bowl of oatmeal for breakfast. Recalls chicken, rice, mac/cheese, rolls for dinner. Patient says she ate maybe 50% of this.   Nutrition Focused Physical Exam: deferred   Medications: acyclovir , amlodipine , oscal, decadron , cardizem , gabapentin , microzide , ativan, lopressor , MVI, klor-con , compazine , B12   Labs: platelets 34   Anthropometrics:   Height: 5'3 Weight: 151 lb UBW: 155-160 lb  BMI: 26.75   NUTRITION DIAGNOSIS: Unintended wt loss related to cancer and associated treatment side effects as evidenced by fatigue, decreased appetite, 3% wt loss from 155 lb 3.2 oz on 8/7 - this is clinically severe for time frame   INTERVENTION:  Educated on eating smaller meals more often and choosing foods that are high in calories and protein - snack ideas provided  Discussed soft moist high protein foods for ease of intake - handout with ideas provided  Suggested increasing Boost (2/day) - samples of Ensure + coupons provided Nursing to review labs with patient  Contact information given   MONITORING, EVALUATION, GOAL: Patient will tolerate increased calories and protein to  minimize further wt loss    Next Visit: Monday August 25 during infusion

## 2023-11-07 NOTE — Radiation Completion Notes (Addendum)
  Radiation Oncology         ([phone] ________________________________  Name: Jamie Ewing MRN: [id number]  Date of Service: 11/06/2023  DOB: [birth year]  End of Treatment Note  Diagnosis: Stage II IgA kappa multiple myeloma, now with a new orbital roof mass and 11th rib lesion Intent: Palliative     ==========DELIVERED PLANS==========  First Treatment Date: 2023-10-19 Last Treatment Date: 2023-11-06   Plan Name: HN_R_Orbit Site: Orbit Right Technique: IMRT Mode: Photon Dose Per Fraction: 2 Gy Prescribed Dose (Delivered / Prescribed): 2 Gy / 2 Gy Prescribed Fxs (Delivered / Prescribed): 1 / 1   Plan Name: HN_R_Orbit:1 Site: Orbit Right Technique: IMRT Mode: Photon Dose Per Fraction: 2 Gy Prescribed Dose (Delivered / Prescribed): 24 Gy / 24 Gy Prescribed Fxs (Delivered / Prescribed): 12 / 12   Plan Name: Chest_L_Rib Site: Ribs, Left Technique: 3D Mode: Photon Dose Per Fraction: 2.5 Gy Prescribed Dose (Delivered / Prescribed): 25 Gy / 25 Gy Prescribed Fxs (Delivered / Prescribed): 10 / 10     ====================================   The patient tolerated radiation. She developed mild fatigue and hyperpigmentation in the supraorbital area throughout the treatment. She reported significant improvement in her vision out of the right eye and improvement in chest wall pain.   The patient will return in one month and will continue follow up with Dr. Davonna as well.      Ronita Due, PA-C

## 2023-11-08 ENCOUNTER — Emergency Department (HOSPITAL_COMMUNITY)

## 2023-11-08 ENCOUNTER — Other Ambulatory Visit: Payer: Self-pay

## 2023-11-08 ENCOUNTER — Emergency Department (HOSPITAL_COMMUNITY)
Admission: EM | Admit: 2023-11-08 | Discharge: 2023-11-08 | Disposition: A | Discharge status: Home / Self Care | Source: Ambulatory Visit | Attending: Emergency Medicine | Admitting: Emergency Medicine

## 2023-11-08 ENCOUNTER — Telehealth: Payer: Self-pay | Admitting: *Deleted

## 2023-11-08 ENCOUNTER — Encounter (HOSPITAL_COMMUNITY): Payer: Self-pay | Admitting: Emergency Medicine

## 2023-11-08 DIAGNOSIS — C9000 Multiple myeloma not having achieved remission: Secondary | ICD-10-CM | POA: Diagnosis not present

## 2023-11-08 DIAGNOSIS — E860 Dehydration: Secondary | ICD-10-CM | POA: Insufficient documentation

## 2023-11-08 DIAGNOSIS — M898X1 Other specified disorders of bone, shoulder: Secondary | ICD-10-CM | POA: Diagnosis not present

## 2023-11-08 DIAGNOSIS — R531 Weakness: Secondary | ICD-10-CM

## 2023-11-08 DIAGNOSIS — D696 Thrombocytopenia, unspecified: Secondary | ICD-10-CM | POA: Diagnosis not present

## 2023-11-08 LAB — CBC WITH DIFFERENTIAL/PLATELET
Abs Granulocyte: 3.6 K/uL (ref 1.5–6.5)
Abs Immature Granulocytes: 0.02 K/uL (ref 0.00–0.07)
Basophils Absolute: 0 K/uL (ref 0.0–0.1)
Basophils Relative: 1 %
Eosinophils Absolute: 0 K/uL (ref 0.0–0.5)
Eosinophils Relative: 0 %
HCT: 38.4 % (ref 36.0–46.0)
Hemoglobin: 12.3 g/dL (ref 12.0–15.0)
Immature Granulocytes: 1 %
Lymphocytes Relative: 7 %
Lymphs Abs: 0.3 K/uL — ABNORMAL LOW (ref 0.7–4.0)
MCH: 33.4 pg (ref 26.0–34.0)
MCHC: 32 g/dL (ref 30.0–36.0)
MCV: 104.3 fL — ABNORMAL HIGH (ref 80.0–100.0)
Monocytes Absolute: 0.4 K/uL (ref 0.1–1.0)
Monocytes Relative: 9 %
Neutro Abs: 3.6 K/uL (ref 1.7–7.7)
Neutrophils Relative %: 82 %
Platelets: 48 K/uL — ABNORMAL LOW (ref 150–400)
RBC: 3.68 MIL/uL — ABNORMAL LOW (ref 3.87–5.11)
RDW: 15.9 % — ABNORMAL HIGH (ref 11.5–15.5)
WBC: 4.3 K/uL (ref 4.0–10.5)
nRBC: 0 % (ref 0.0–0.2)

## 2023-11-08 LAB — COMPREHENSIVE METABOLIC PANEL WITH GFR
ALT: 21 U/L (ref 0–44)
AST: 19 U/L (ref 15–41)
Albumin: 2.9 g/dL — ABNORMAL LOW (ref 3.5–5.0)
Alkaline Phosphatase: 77 U/L (ref 38–126)
Anion gap: 16 — ABNORMAL HIGH (ref 5–15)
BUN: 20 mg/dL (ref 8–23)
CO2: 21 mmol/L — ABNORMAL LOW (ref 22–32)
Calcium: 9 mg/dL (ref 8.9–10.3)
Chloride: 95 mmol/L — ABNORMAL LOW (ref 98–111)
Creatinine, Ser: 0.62 mg/dL (ref 0.44–1.00)
GFR, Estimated: >60 mL/min (ref >60)
Glucose, Bld: 138 mg/dL — ABNORMAL HIGH (ref 70–99)
Potassium: 3.8 mmol/L (ref 3.5–5.1)
Sodium: 132 mmol/L — ABNORMAL LOW (ref 135–145)
Total Bilirubin: 0.6 mg/dL (ref 0.0–1.2)
Total Protein: 6.1 g/dL — ABNORMAL LOW (ref 6.5–8.1)

## 2023-11-08 LAB — URINALYSIS, ROUTINE W REFLEX MICROSCOPIC
Bilirubin Urine: NEGATIVE
Glucose, UA: NEGATIVE mg/dL
Hgb urine dipstick: NEGATIVE
Ketones, ur: NEGATIVE mg/dL
Leukocytes,Ua: NEGATIVE
Nitrite: NEGATIVE
Protein, ur: NEGATIVE mg/dL
Specific Gravity, Urine: 1.02 (ref 1.005–1.030)
pH: 5 (ref 5.0–8.0)

## 2023-11-08 MED ORDER — SODIUM CHLORIDE 0.9 % IV BOLUS
500.0000 mL | Freq: Once | INTRAVENOUS | Status: AC
  Administered 2023-11-08: 500 mL via INTRAVENOUS

## 2023-11-08 NOTE — Telephone Encounter (Signed)
 She called and did not get treatment last week due to low plt count. She is feeling weak and has decreased urine OP. Went 12 hours without urinating and is edematous.  Per Delon Hope, NP advised patient be evaluated in the ER.  Verbalized understanding.

## 2023-11-08 NOTE — ED Notes (Signed)
 Pt unable to urinate at this time. Will try again in a little bit for urine sample.

## 2023-11-08 NOTE — ED Triage Notes (Signed)
 Pt currently undergoing chemo and radiation.  Was not able to have tx Monday d/t low platelets.  Was told to wait a week to get her strength back up.  Pt reports sitting for as many as 12 hours without even getting up to go to the bathroom, no urge, and no appetite.  Pt feels she is just too weak to do anything.

## 2023-11-08 NOTE — ED Provider Notes (Signed)
 [place] EMERGENCY DEPARTMENT AT [facility] Provider Note   CSN: [id number] Arrival date & time: 11/08/23  8386     Patient presents with: Fatigue   Jamie Ewing is a 84 y.o. female.  {Add pertinent medical, surgical, social history, OB history to YEP:67052} Patient has multiple myeloma and has been getting chemo and radiation treatment.  She states this week she was very tired and could not get her treatment.  Her platelets have been running low.   Weakness      Prior to Admission medications   Medication Sig Start Date End Date Taking? Authorizing Provider  acetaminophen  (TYLENOL ) 500 MG tablet Take 1,000 mg by mouth at bedtime. 10/21/19  Yes [provider]  acyclovir  (ZOVIRAX ) 400 MG tablet Take 1 tablet (400 mg total) by mouth 2 (two) times daily. 09/26/23  Yes Rogers Hai, MD  amLODipine  (NORVASC ) 5 MG tablet Take 5 mg by mouth daily.  05/13/11  Yes [provider]  calcium carbonate (OSCAL) 1500 (600 Ca) MG TABS tablet Take 600 mg of elemental calcium by mouth daily with breakfast. 04/17/18  Yes [provider]  dexamethasone  (DECADRON ) 4 MG tablet Take 1 tablet (4 mg total) by mouth once a week. Take 20 mg by mouth weekly the day after darzalex  injection 09/26/23  Yes Rogers Hai, MD  diltiazem  (CARDIZEM  SR) 60 MG 12 hr capsule Take 1 capsule (60 mg total) by mouth 2 (two) times daily. 09/12/23  Yes Mallipeddi, Vishnu P, MD  hydrochlorothiazide  (HYDRODIURIL ) 12.5 MG tablet Take 12.5 mg by mouth daily.   Yes [provider]  LORazepam (ATIVAN) 0.5 MG tablet Take 0.25-0.5 mg by mouth 2 (two) times daily as needed for anxiety. 08/16/22  Yes [provider]  LOW-DOSE ASPIRIN PO Take 81 mg by mouth at bedtime.   Yes [provider]  metoprolol  tartrate (LOPRESSOR ) 25 MG tablet Take 25 mg by mouth 2 (two) times daily. 09/12/23  Yes [provider]  Multiple Vitamins-Minerals (THERA-M) TABS Take 1  tablet by mouth daily.   Yes [provider]  potassium chloride  SA (KLOR-CON  M) 20 MEQ tablet TAKE 1 TABLET TWICE DAILY 04/10/23  Yes Katragadda, Sreedhar, MD  prochlorperazine  (COMPAZINE ) 10 MG tablet Take 1 tablet (10 mg total) by mouth every 6 (six) hours as needed. Patient taking differently: Take 10 mg by mouth every 6 (six) hours as needed for vomiting or nausea. 09/26/23  Yes Katragadda, Sreedhar, MD  vitamin B-12 (CYANOCOBALAMIN ) 1000 MCG tablet Take 1,000 mcg by mouth daily.   Yes [provider]    Allergies: Patient has no known allergies.    Review of Systems  Neurological:  Positive for weakness.    Updated Vital Signs BP 103/61   Pulse 85   Temp 98.6 F (37 C) (Oral)   Resp 15   SpO2 99%   Physical Exam  (all labs ordered are listed, but only abnormal results are displayed) Labs Reviewed  CBC WITH DIFFERENTIAL/PLATELET - Abnormal; Notable for the following components:      Result Value   RBC 3.68 (*)    MCV 104.3 (*)    RDW 15.9 (*)    Platelets 48 (*)    Lymphs Abs 0.3 (*)    All other components within normal limits  COMPREHENSIVE METABOLIC PANEL WITH GFR - Abnormal; Notable for the following components:   Sodium 132 (*)    Chloride 95 (*)    CO2 21 (*)    Glucose, Bld  138 (*)    Total Protein 6.1 (*)    Albumin  2.9 (*)    Anion gap 16 (*)    All other components within normal limits  URINALYSIS, ROUTINE W REFLEX MICROSCOPIC    EKG: None  Radiology: [facility] Chest Port 1 View Result Date: 11/08/2023 CLINICAL DATA:  Thrombocytopenia. Undergoing chemotherapy and radiation therapy. EXAM: PORTABLE CHEST 1 VIEW COMPARISON:  08/14/2022 and CT chest 01/11/2007. FINDINGS: Trachea is midline. Heart size normal. Lungs are clear. No pleural fluid. Lucent lesion in the lateral left clavicle is again noted. Lower thoracic spinal hardware. IMPRESSION: 1. No acute findings. 2. Lytic lesion in the lateral left clavicle, compatible with multiple myeloma.  Electronically Signed   By: Newell Eke M.D.   On: 11/08/2023 17:56    {Document cardiac monitor, telemetry assessment procedure when appropriate:32947} Procedures   Medications Ordered in the ED  sodium chloride  0.9 % bolus 500 mL (0 mLs Intravenous Stopped 11/08/23 1842)      {Click here for ABCD2, HEART and other calculators REFRESH Note before signing:1}                              Medical Decision Making Amount and/or Complexity of Data Reviewed Labs: ordered. Radiology: ordered. ECG/medicine tests: ordered.   Patient with dehydration and fatigue.  She improved with IV fluid.  Her platelet count seems to be improving and has increased to 40% up to 48.  She will go home and drink plenty of fluids and rest over the weekend and follow-up with her doctor next week  {Document critical care time when appropriate  Document review of labs and clinical decision tools ie CHADS2VASC2, etc  Document your independent review of radiology images and any outside records  Document your discussion with family members, caretakers and with consultants  Document social determinants of health affecting pt's care  Document your decision making why or why not admission, treatments were needed:32947:::1}   Final diagnoses:  Weakness  Dehydration    ED Discharge Orders     None

## 2023-11-08 NOTE — Discharge Instructions (Signed)
 Drink plenty of fluids and rest.  Follow-up with your doctor next week

## 2023-11-12 DIAGNOSIS — C9000 Multiple myeloma not having achieved remission: Secondary | ICD-10-CM | POA: Insufficient documentation

## 2023-11-12 DIAGNOSIS — G629 Polyneuropathy, unspecified: Secondary | ICD-10-CM | POA: Insufficient documentation

## 2023-11-12 NOTE — Assessment & Plan Note (Addendum)
 Stage II IgA kappa multiple myeloma, standard risk Patient with complicated multiple myeloma history s/p several local XRT's, induction with RVD followed by bone marrow transplant in 2020.  She was then on Revlimid  maintenance for 5 years until recent progression Oncology history as below Patient is currently on daratumumab  +Velcade  +dexamethasone   - Continue daratumumab  and Velcade  at current doses.  Velcade  was decreased to 1 mg/m on day 8 for thrombocytopenia.C3D1 today. - Continue acyclovir  twice daily for prophylaxis. - Discussed most common side effects including upper respiratory tract infection, peripheral neuropathy. - Labs reviewed today: CMP: Stable, CBC: WBC: 3.7,hemoglobin: 10.5, platelets: 74. - Recent multiple myeloma labs with improved M spike and free light chains  Return to clinic in 4 weeks with labs for follow-up

## 2023-11-12 NOTE — Progress Notes (Signed)
 Patient Care Team: Sheryle Carwin, MD as PCP - General (Internal Medicine) Stacia Diannah SQUIBB, MD as PCP - Cardiology (Cardiology)  Clinic Day:  11/13/2023  Referring physician: Sheryle Carwin, MD   CHIEF COMPLAINT:  CC: Recurrent stage II IgA kappa multiple myeloma, high risk   Jamie Ewing 83 y.o. female was transferred to my care after her prior physician has left.   ASSESSMENT & PLAN:   Assessment & Plan: Jamie Ewing  is a 83 y.o. female with recurrent stage II IgA kappa multiple myeloma, high risk  Assessment & Plan Multiple myeloma not having achieved remission (HCC) Stage II IgA kappa multiple myeloma, standard risk Patient with complicated multiple myeloma history s/p several local XRT's, induction with RVD followed by bone marrow transplant in 2020.  She was then on Revlimid  maintenance for 5 years until recent progression Oncology history as below Patient is currently on daratumumab  +Velcade  +dexamethasone   - Continue daratumumab  and Velcade  at current doses.  Velcade  was decreased to 1 mg/m on day 8 for thrombocytopenia.C3D1 today. - Continue acyclovir  twice daily for prophylaxis. - Discussed most common side effects including upper respiratory tract infection, peripheral neuropathy. - Labs reviewed today: CMP: Stable, CBC: WBC: 3.7,hemoglobin: 10.5, platelets: 74. - Recent multiple myeloma labs with improved M spike and free light chains  Return to clinic in 4 weeks with labs for follow-up Metastatic multiple myeloma to bone [facility]) Patient has multiple bone lesions secondary to multiple myeloma He is on Zometa  4 mg every 12 weeks  - Continue zometa  4 mg every 12 weeks - Continue vitamin D  and calcium Other polyneuropathy Patient has some numbness and tingling in her hands and feet Currently not taking any medications and is tolerable  - Continue to monitor for now  Pedal edema Patient has bilateral pedal edema extending up to the ankle Likely secondary to  steroid use  - Will prescribe Lasix  40 mg taken as needed -Continue to monitor for now.  If worsens, will consider echocardiogram of heart Pancytopenia (HCC) Likely secondary to myelosuppression from Velcade   - Continue to monitor for now - Will repeat nutritional workup for anemia    The patient understands the plans discussed today and is in agreement with them.  She knows to contact our office if she develops concerns prior to her next appointment.  90 minutes of total time was spent for this patient encounter, including preparation, face-to-face counseling with the patient and coordination of care, physical exam, and documentation of the encounter. > 50% of the time was spent on counseling as documented under my assessment and plan.    Jamie Ewing,acting as a Neurosurgeon for Mickiel Dry, MD.,have documented all relevant documentation on the behalf of Mickiel Dry, MD,as directed by  Mickiel Dry, MD while in the presence of Mickiel Dry, MD.  I, Mickiel Dry MD, have reviewed the above documentation for accuracy and completeness, and I agree with the above.     Mickiel Dry, MD  [facility] [place] - A DEPT OF [facility] [address] [place] [address] Dept: [phone] Dept Fax: [phone]   Orders Placed This Encounter  Procedures   Iron and TIBC ([facility] ONLY)   Ferritin   Vitamin B12   Folate     ONCOLOGY HISTORY:   I have reviewed her chart and materials related to her cancer extensively and collaborated history with the patient. Summary of oncologic history is as follows:  Diagnosis: Recurrent stage II IgA kappa multiple myeloma, high risk   -10/06/2017: T9-T10 laminectomy with resection of epidural tumor and T8-T11 posterior fusion.  Pathology: Plasma cell neoplasm. IHC positive for CD138 and the plasma cells are kappa restricted by light chain in situ  hybridization. -10/11/2017: Multiple Myeloma Panel.  SPEP: M spike: 2.1, IFE: IgA monoclonal protein with kappa light chain specificity, free kappa light chain: 12.2, free lambda light chain: 1.2, ratio: 1.69 -10/12/2017: Bone Marrow Biopsy.  Pathology: Plasma cell neoplasm. The marrow is hypercellular with increased monoclonal plasma cells (9% aspirate, 20% CD138) 10/12/2017: Bone Survey: There are findings consistent with multiple myeloma with lytic lesions noted in the skull and left femur. No other lytic lesions. -10/30/2017-11/10/2017: XRT to thoracic spine -11/14/2017 -02/19/2018: 5 cycles of RVD -11/21/2017-Current: Zometa  4mg  every 12 weeks -04/03/2018: Stem cell transplant -08/24/2018-09/26/2023: Revlimid  maintenance therapy 10 mg 3 weeks on/1 week off, discontinued due to progression -09/02/2019: PET: Redemonstrated bilateral cervical, right supraclavicular and right axillary hypermetabolic adenopathy with interval resolution of left axillary adenopathy. Persistent increased FDG uptake in both palatine tonsils. Similar lytic and sclerotic lesions in bones with no corresponding FDG uptake.  -09/26/2019: Myeloma Panel. SPEP and immunofixation negative. FLC ratio normal with elevated kappa light chains at 32.5.  -10/21/2019: Right neck lymph node biopsy negative for malignancy.  -09/29/2022: PET: Active myeloma involving the right posterior 10th rib. Focal hypermetabolism in the left mid femoral shaft may reflect additional site of myeloma -11/03/2022 to 11/16/2022: XRT of 25 Gray in 10 fractions to rib lesion and mid femur lesion completed -01/31/2023: Revlimid  dose reduced to 5 mg 3 weeks on/1 week off due to decreased tolerance (decrease in energy and appetite, and hair thinning) -08/24/2023:  PET: New tracer avid lucent bone lesions within the posterior aspect of the left eleventh rib and right frontal bone. New lucent lesions within the right calvarial convexity and proximal shaft of left  femur with mild increased tracer uptake. New foci mild increased uptake noted within the right pedicle of the L3 vertebra and right posterior iliac bone without underlying lucent bone lesions. -09/12/2023: Bone Marrow Biopsy.  -Pathology: Hypercellular bone marrow (30%) involved by a kappa restricted plasma cell neoplasm (30 to 40%) in the background of otherwise orderly trilineage hematopoiesis.  -FISH: T p53 mutation, gain of longer of chromosome 1 (1 q.), monosomy 13  -Cytogenetics: 70, XX [20] -09/28/2023: MRI Orbits: There is a circumscribed avidly enhancing mass within the orbital roof which extends into the retro conal orbit and impresses upon the superior rectus muscle. A separate circumscribed lesion is noted within the sphenoid wing anterolateral to the anterior clinoid. -10/02/2023-current: Daratumumab  VD,  Velcade  reduced to 1 mg/m on day 8 on 10/23/2023 due to increased weakness in patient -10/19/2023 -11/06/2023: XRT of 10 fractions to orbital lesion and left 11th rib    Current Treatment:  Daratumumab  + Velcade  + Dexamethasone   INTERVAL HISTORY:   Jamie Ewing is here today for follow up. Patient is accompanied by her grand daughter today.   She reports bilateral pedal edema.  No pain.  No shortness of breath, fatigue.  Appetite is good and no recent weight loss.  Overall, is doing well.    She reports baseline numbness and tingling in her hands and feet that did not get worse recently.   I have reviewed the past medical history, past surgical history, social history and family history with the patient and they are unchanged from previous note.  ALLERGIES:  has no known  allergies.  MEDICATIONS:  Current Outpatient Medications  Medication Sig Dispense Refill   acetaminophen  (TYLENOL ) 500 MG tablet Take 1,000 mg by mouth at bedtime.     acyclovir  (ZOVIRAX ) 400 MG tablet Take 1 tablet (400 mg total) by mouth 2 (two) times daily. 60 tablet 3   amLODipine  (NORVASC ) 5 MG  tablet Take 5 mg by mouth daily.      calcium carbonate (OSCAL) 1500 (600 Ca) MG TABS tablet Take 600 mg of elemental calcium by mouth daily with breakfast.     dexamethasone  (DECADRON ) 4 MG tablet Take 1 tablet (4 mg total) by mouth once a week. Take 20 mg by mouth weekly the day after darzalex  injection 20 tablet 2   diltiazem  (CARDIZEM  SR) 60 MG 12 hr capsule Take 1 capsule (60 mg total) by mouth 2 (two) times daily. 180 capsule 2   hydrochlorothiazide  (HYDRODIURIL ) 12.5 MG tablet Take 12.5 mg by mouth daily.     LORazepam (ATIVAN) 0.5 MG tablet Take 0.25-0.5 mg by mouth 2 (two) times daily as needed for anxiety.     LOW-DOSE ASPIRIN PO Take 81 mg by mouth at bedtime.     metoprolol  tartrate (LOPRESSOR ) 25 MG tablet Take 25 mg by mouth 2 (two) times daily.     Multiple Vitamins-Minerals (THERA-M) TABS Take 1 tablet by mouth daily.     potassium chloride  SA (KLOR-CON  M) 20 MEQ tablet TAKE 1 TABLET TWICE DAILY 180 tablet 3   prochlorperazine  (COMPAZINE ) 10 MG tablet Take 1 tablet (10 mg total) by mouth every 6 (six) hours as needed. (Patient taking differently: Take 10 mg by mouth every 6 (six) hours as needed for vomiting or nausea.) 60 tablet 2   vitamin B-12 (CYANOCOBALAMIN ) 1000 MCG tablet Take 1,000 mcg by mouth daily.     furosemide  (LASIX ) 40 MG tablet Take 1 tablet (40 mg total) by mouth daily as needed. 30 tablet 2   No current facility-administered medications for this visit.    REVIEW OF SYSTEMS:   Constitutional: Denies fevers, chills or abnormal weight loss Eyes: Denies blurriness of vision Ears, nose, mouth, throat, and face: Denies mucositis or sore throat Respiratory: Denies cough, dyspnea or wheezes Cardiovascular: Denies palpitation, chest discomfort or lower extremity swelling Gastrointestinal:  Denies nausea, heartburn or change in bowel habits Skin: Denies abnormal skin rashes Lymphatics: Denies new lymphadenopathy or easy bruising Neurological:Denies numbness,  tingling or new weaknesses Behavioral/Psych: Mood is stable, no new changes  All other systems were reviewed with the patient and are negative.   VITALS:  Blood pressure (!) 108/56, pulse 74, temperature 98.9 F (37.2 C), temperature source Oral, resp. rate 16, weight 155 lb 12.8 oz (70.7 kg), SpO2 100%.  Wt Readings from Last 3 Encounters:  11/13/23 155 lb 12.8 oz (70.7 kg)  11/06/23 151 lb (68.5 kg)  11/02/23 151 lb (68.5 kg)    Body mass index is 27.6 kg/m.  Performance status (ECOG): 2 - Symptomatic, <50% confined to bed  PHYSICAL EXAM:   GENERAL:alert, no distress and comfortable SKIN: skin color, texture, turgor are normal, no rashes or significant lesions NECK: Bony protrusion on the right clavicle sternal joint LYMPH:  no palpable lymphadenopathy in the cervical, axillary or inguinal LUNGS: clear to auscultation and percussion with normal breathing effort HEART: regular rate & rhythm and no murmurs and no lower extremity edema ABDOMEN:abdomen soft, non-tender and normal bowel sounds Musculoskeletal:no cyanosis of digits and no clubbing  NEURO: alert & oriented x 3 with fluent speech, no  focal motor/sensory deficits  LABORATORY DATA:  I have reviewed the data as listed   Lab Results  Component Value Date   WBC 3.7 (L) 11/13/2023   NEUTROABS 3.1 11/13/2023   HGB 10.5 (L) 11/13/2023   HCT 31.2 (L) 11/13/2023   MCV 98.7 11/13/2023   PLT 74 (L) 11/13/2023      Chemistry      Component Value Date/Time   NA 136 11/13/2023 0923   K 3.9 11/13/2023 0923   CL 101 11/13/2023 0923   CO2 26 11/13/2023 0923   BUN 9 11/13/2023 0923   CREATININE 0.69 11/13/2023 0923      Component Value Date/Time   CALCIUM 8.5 (L) 11/13/2023 0923   ALKPHOS 60 11/13/2023 0923   AST 20 11/13/2023 0923   ALT 19 11/13/2023 0923   BILITOT 0.7 11/13/2023 0923      Latest Reference Range & Units 10/30/23 12:24  Total Protein ELP 6.0 - 8.5 g/dL 6.5  Albumin  ELP 2.9 - 4.4 g/dL 3.1   Globulin, Total 2.2 - 3.9 g/dL 3.4 (C)  A/G Ratio 0.7 - 1.7  0.9 (C)  Alpha-1-Globulin 0.0 - 0.4 g/dL 0.2  Joeyj-7-Honalopw 0.4 - 1.0 g/dL 1.1 (H)  Beta Globulin 0.7 - 1.3 g/dL 1.6 (H)  Gamma Globulin 0.4 - 1.8 g/dL 0.5  M-SPIKE, % Not Observed g/dL 0.7 (H)  SPE Interp.  Comment  Comment  Comment  (H): Data is abnormally high (C): Corrected   Latest Reference Range & Units 10/30/23 12:24  Kappa free light chain 3.3 - 19.4 mg/L 7.2  Lambda free light chains 5.7 - 26.3 mg/L 2.7 (L)  Kappa, lambda light chain ratio 0.26 - 1.65  2.67 (H)  (L): Data is abnormally low (H): Data is abnormally high  RADIOGRAPHIC STUDIES: I have personally reviewed the radiological images as listed and agreed with the findings in the report.  None new to review

## 2023-11-12 NOTE — Assessment & Plan Note (Addendum)
 Patient has multiple bone lesions secondary to multiple myeloma He is on Zometa  4 mg every 12 weeks  - Continue zometa  4 mg every 12 weeks - Continue vitamin D  and calcium

## 2023-11-13 ENCOUNTER — Inpatient Hospital Stay

## 2023-11-13 ENCOUNTER — Inpatient Hospital Stay: Admitting: Oncology

## 2023-11-13 ENCOUNTER — Inpatient Hospital Stay: Admitting: Dietician

## 2023-11-13 ENCOUNTER — Other Ambulatory Visit: Payer: Self-pay | Admitting: *Deleted

## 2023-11-13 VITALS — BP 108/56 | HR 74 | Temp 98.9°F | Resp 16 | Wt 155.8 lb

## 2023-11-13 DIAGNOSIS — G6289 Other specified polyneuropathies: Secondary | ICD-10-CM | POA: Diagnosis not present

## 2023-11-13 DIAGNOSIS — R600 Localized edema: Secondary | ICD-10-CM | POA: Diagnosis not present

## 2023-11-13 DIAGNOSIS — C9000 Multiple myeloma not having achieved remission: Secondary | ICD-10-CM

## 2023-11-13 DIAGNOSIS — Z923 Personal history of irradiation: Secondary | ICD-10-CM | POA: Diagnosis not present

## 2023-11-13 DIAGNOSIS — Z510 Encounter for antineoplastic radiation therapy: Secondary | ICD-10-CM | POA: Diagnosis not present

## 2023-11-13 DIAGNOSIS — D61818 Other pancytopenia: Secondary | ICD-10-CM | POA: Insufficient documentation

## 2023-11-13 DIAGNOSIS — C7951 Secondary malignant neoplasm of bone: Secondary | ICD-10-CM

## 2023-11-13 DIAGNOSIS — D508 Other iron deficiency anemias: Secondary | ICD-10-CM

## 2023-11-13 LAB — CBC WITH DIFFERENTIAL/PLATELET
Abs Granulocyte: 3.1 K/uL (ref 1.5–6.5)
Abs Immature Granulocytes: 0.02 K/uL (ref 0.00–0.07)
Basophils Absolute: 0 K/uL (ref 0.0–0.1)
Basophils Relative: 0 %
Eosinophils Absolute: 0 K/uL (ref 0.0–0.5)
Eosinophils Relative: 0 %
HCT: 31.2 % — ABNORMAL LOW (ref 36.0–46.0)
Hemoglobin: 10.5 g/dL — ABNORMAL LOW (ref 12.0–15.0)
Immature Granulocytes: 1 %
Lymphocytes Relative: 8 %
Lymphs Abs: 0.3 K/uL — ABNORMAL LOW (ref 0.7–4.0)
MCH: 33.2 pg (ref 26.0–34.0)
MCHC: 33.7 g/dL (ref 30.0–36.0)
MCV: 98.7 fL (ref 80.0–100.0)
Monocytes Absolute: 0.3 K/uL (ref 0.1–1.0)
Monocytes Relative: 9 %
Neutro Abs: 3.1 K/uL (ref 1.7–7.7)
Neutrophils Relative %: 82 %
Platelets: 74 K/uL — ABNORMAL LOW (ref 150–400)
RBC: 3.16 MIL/uL — ABNORMAL LOW (ref 3.87–5.11)
RDW: 15.9 % — ABNORMAL HIGH (ref 11.5–15.5)
WBC: 3.7 K/uL — ABNORMAL LOW (ref 4.0–10.5)
nRBC: 0 % (ref 0.0–0.2)

## 2023-11-13 LAB — COMPREHENSIVE METABOLIC PANEL WITH GFR
ALT: 19 U/L (ref 0–44)
AST: 20 U/L (ref 15–41)
Albumin: 2.9 g/dL — ABNORMAL LOW (ref 3.5–5.0)
Alkaline Phosphatase: 60 U/L (ref 38–126)
Anion gap: 9 (ref 5–15)
BUN: 9 mg/dL (ref 8–23)
CO2: 26 mmol/L (ref 22–32)
Calcium: 8.5 mg/dL — ABNORMAL LOW (ref 8.9–10.3)
Chloride: 101 mmol/L (ref 98–111)
Creatinine, Ser: 0.69 mg/dL (ref 0.44–1.00)
GFR, Estimated: >60 mL/min (ref >60)
Glucose, Bld: 119 mg/dL — ABNORMAL HIGH (ref 70–99)
Potassium: 3.9 mmol/L (ref 3.5–5.1)
Sodium: 136 mmol/L (ref 135–145)
Total Bilirubin: 0.7 mg/dL (ref 0.0–1.2)
Total Protein: 6 g/dL — ABNORMAL LOW (ref 6.5–8.1)

## 2023-11-13 LAB — VITAMIN B12: Vitamin B-12: 1952 pg/mL — ABNORMAL HIGH (ref 180–914)

## 2023-11-13 LAB — IRON AND TIBC
Iron: 36 ug/dL (ref 28–170)
Saturation Ratios: 14 % (ref 10.4–31.8)
TIBC: 261 ug/dL (ref 250–450)
UIBC: 225 ug/dL

## 2023-11-13 LAB — FERRITIN: Ferritin: 327 ng/mL — ABNORMAL HIGH (ref 11–307)

## 2023-11-13 LAB — FOLATE: Folate: 17.2 ng/mL (ref >5.9)

## 2023-11-13 MED ORDER — DARATUMUMAB-HYALURONIDASE-FIHJ 1800-30000 MG-UT/15ML ~~LOC~~ SOLN
1800.0000 mg | Freq: Once | SUBCUTANEOUS | Status: AC
  Administered 2023-11-13: 1800 mg via SUBCUTANEOUS
  Filled 2023-11-13: qty 15

## 2023-11-13 MED ORDER — FUROSEMIDE 40 MG PO TABS: 40.0000 mg | ORAL_TABLET | Freq: Every day | ORAL | 2 refills | Status: DC | PRN

## 2023-11-13 MED ORDER — DEXAMETHASONE 4 MG PO TABS
20.0000 mg | ORAL_TABLET | Freq: Once | ORAL | Status: AC
  Administered 2023-11-13: 20 mg via ORAL
  Filled 2023-11-13: qty 5

## 2023-11-13 MED ORDER — ACETAMINOPHEN 325 MG PO TABS
650.0000 mg | ORAL_TABLET | Freq: Once | ORAL | Status: AC
  Administered 2023-11-13: 650 mg via ORAL
  Filled 2023-11-13: qty 2

## 2023-11-13 MED ORDER — BORTEZOMIB CHEMO SQ INJECTION 3.5 MG (2.5MG/ML)
1.0000 mg/m2 | Freq: Once | INTRAMUSCULAR | Status: AC
  Administered 2023-11-13: 1.75 mg via SUBCUTANEOUS
  Filled 2023-11-13: qty 0.7

## 2023-11-13 MED ORDER — CETIRIZINE HCL 10 MG PO TABS
10.0000 mg | ORAL_TABLET | Freq: Once | ORAL | Status: AC
  Administered 2023-11-13: 10 mg via ORAL
  Filled 2023-11-13: qty 1

## 2023-11-13 NOTE — Assessment & Plan Note (Signed)
 Patient has bilateral pedal edema extending up to the ankle Likely secondary to steroid use  - Will prescribe Lasix  40 mg taken as needed -Continue to monitor for now.  If worsens, will consider echocardiogram of heart

## 2023-11-13 NOTE — Patient Instructions (Signed)
 [facility] [place] - A DEPT OF [facility]  Discharge Instructions: Thank you for choosing [facility] to provide your oncology and hematology care.  If you have a lab appointment with the Cancer Center - please note that after April 8th, 2024, all labs will be drawn in the cancer center.  You do not have to check in or register with the main entrance as you have in the past but will complete your check-in in the cancer center.  Wear comfortable clothing and clothing appropriate for easy access to any Portacath or PICC line.   We strive to give you quality time with your provider. You may need to reschedule your appointment if you arrive late (15 or more minutes).  Arriving late affects you and other patients whose appointments are after yours.  Also, if you miss three or more appointments without notifying the office, you may be dismissed from the clinic at the provider's discretion.      For prescription refill requests, have your pharmacy contact our office and allow 72 hours for refills to be completed.    Today you received the following chemotherapy and/or immunotherapy agents velcade , dara faspro   To help prevent nausea and vomiting after your treatment, we encourage you to take your nausea medication as directed.  BELOW ARE SYMPTOMS THAT SHOULD BE REPORTED IMMEDIATELY: *FEVER GREATER THAN 100.4 F (38 C) OR HIGHER *CHILLS OR SWEATING *NAUSEA AND VOMITING THAT IS NOT CONTROLLED WITH YOUR NAUSEA MEDICATION *UNUSUAL SHORTNESS OF BREATH *UNUSUAL BRUISING OR BLEEDING *URINARY PROBLEMS (pain or burning when urinating, or frequent urination) *BOWEL PROBLEMS (unusual diarrhea, constipation, pain near the anus) TENDERNESS IN MOUTH AND THROAT WITH OR WITHOUT PRESENCE OF ULCERS (sore throat, sores in mouth, or a toothache) UNUSUAL RASH, SWELLING OR PAIN  UNUSUAL VAGINAL DISCHARGE OR ITCHING   Items with * indicate a potential emergency and should be  followed up as soon as possible or go to the Emergency Department if any problems should occur.  Please show the CHEMOTHERAPY ALERT CARD or IMMUNOTHERAPY ALERT CARD at check-in to the Emergency Department and triage nurse.  Should you have questions after your visit or need to cancel or reschedule your appointment, please contact [facility] [place] - A DEPT OF [facility] [phone]  and follow the prompts.  Office hours are 8:00 a.m. to 4:30 p.m. Monday - Friday. Please note that voicemails left after 4:00 p.m. may not be returned until the following business day.  We are closed weekends and major holidays. You have access to a nurse at all times for urgent questions. Please call the main number to the clinic [phone] and follow the prompts.  For any non-urgent questions, you may also contact your provider using MyChart. We now offer e-Visits for anyone 9 and older to request care online for non-urgent symptoms. For details visit [email or web address].   Also download the MyChart app! Go to the app store, search MyChart, open the app, select [facility], and log in with your MyChart username and password.

## 2023-11-13 NOTE — Progress Notes (Signed)
 Patient has been examined by Dr. Davonna. Vital signs and labs have been reviewed by MD - ANC, Creatinine, LFTs, hemoglobin, and platelets have been reviewed by M.D. - pt may proceed with treatment.  Primary RN and pharmacy notified.

## 2023-11-13 NOTE — Progress Notes (Signed)
 Nutrition Follow-up:  Pt with multiple myeloma not having achieved remission. S/p stem cell transplant (2020) followed by maintenance revlimid  (last 09/26/23). Patient completed radiation to orbital lesion and left 11th rib (final RT 8/18). She is receiving DaraVd q28d.    Met with patient in infusion. She reports improving fatigue, says she is feeling much better this week. Her appetite is picking up. Patient currently on steroids. Noted swelling to bilateral feet/ankles. She will start lasix  per MD. RD assisted with elevating patient feet. She is appreciative. Patient denies NIS.   Medications: reviewed   Labs: glucose 119, albumin  2.9  Anthropometrics: Wt 155 lb 12.8 oz today - increased (+BLE ankle)  8/18 - 151 lb    NUTRITION DIAGNOSIS: Unintended wt loss - appears to be improving per pt account of improved appetite, however actual wt masked by fluid   INTERVENTION:  Continue strategies for increasing calories and protein     MONITORING, EVALUATION, GOAL: wt trends, edema, intake    NEXT VISIT: Monday September 15 during infusion

## 2023-11-13 NOTE — Progress Notes (Signed)
 Labs reviewed with Dr. Davonna today. Platelets noted by MD. Manuelita to treat today per MD.  Patient does not take her premeds at home, so we will give them to her here today.     Treatment given per orders. Patient tolerated it well without problems. Vitals stable and discharged home from clinic via wheelchair. Follow up as scheduled.

## 2023-11-13 NOTE — Assessment & Plan Note (Signed)
 Likely secondary to myelosuppression from Velcade   - Continue to monitor for now - Will repeat nutritional workup for anemia

## 2023-11-13 NOTE — Assessment & Plan Note (Signed)
 Patient has some numbness and tingling in her hands and feet Currently not taking any medications and is tolerable  - Continue to monitor for now

## 2023-11-13 NOTE — Patient Instructions (Addendum)
 [facility] at [facility] Discharge Instructions   You were seen and examined today by Dr. Davonna.  She reviewed the results of your lab work which are normal/stable.   We sent a prescription for a fluid pill for the swelling in your legs. Take daily in the morning as needed for swelling. If your legs are not swollen, do not take these pills.   We will proceed with your treatment today.   Return as scheduled.    Thank you for choosing [facility] at [facility] to provide your oncology and hematology care.  To afford each patient quality time with our provider, please arrive at least 15 minutes before your scheduled appointment time.   If you have a lab appointment with the Cancer Center please come in thru the Main Entrance and check in at the main information desk.  You need to re-schedule your appointment should you arrive 10 or more minutes late.  We strive to give you quality time with our providers, and arriving late affects you and other patients whose appointments are after yours.  Also, if you no show three or more times for appointments you may be dismissed from the clinic at the providers discretion.     Again, thank you for choosing [facility].  Our hope is that these requests will decrease the amount of time that you wait before being seen by our physicians.       _____________________________________________________________  Should you have questions after your visit to [facility], please contact our office at [phone] and follow the prompts.  Our office hours are 8:00 a.m. and 4:30 p.m. Monday - Friday.  Please note that voicemails left after 4:00 p.m. may not be returned until the following business day.  We are closed weekends and major holidays.  You do have access to a nurse 24-7, just call the main number to the clinic [phone] and do not press any options, hold on the line and a nurse will  answer the phone.    For prescription refill requests, have your pharmacy contact our office and allow 72 hours.    Due to Covid, you will need to wear a mask upon entering the hospital. If you do not have a mask, a mask will be given to you at the Main Entrance upon arrival. For doctor visits, patients may have 1 support person age 69 or older with them. For treatment visits, patients can not have anyone with them due to social distancing guidelines and our immunocompromised population.

## 2023-11-14 ENCOUNTER — Other Ambulatory Visit: Payer: Self-pay

## 2023-11-14 LAB — PROTEIN ELECTROPHORESIS, SERUM
A/G Ratio: 0.9 (ref 0.7–1.7)
Albumin ELP: 2.7 g/dL — ABNORMAL LOW (ref 2.9–4.4)
Alpha-1-Globulin: 0.4 g/dL (ref 0.0–0.4)
Alpha-2-Globulin: 1 g/dL (ref 0.4–1.0)
Beta Globulin: 1.2 g/dL (ref 0.7–1.3)
Gamma Globulin: 0.4 g/dL (ref 0.4–1.8)
Globulin, Total: 2.9 g/dL (ref 2.2–3.9)
M-Spike, %: 0.4 g/dL — ABNORMAL HIGH
Total Protein ELP: 5.6 g/dL — ABNORMAL LOW (ref 6.0–8.5)

## 2023-11-14 LAB — KAPPA/LAMBDA LIGHT CHAINS
Kappa free light chain: 6.5 mg/L (ref 3.3–19.4)
Kappa, lambda light chain ratio: 1.71 — ABNORMAL HIGH (ref 0.26–1.65)
Lambda free light chains: 3.8 mg/L — ABNORMAL LOW (ref 5.7–26.3)

## 2023-11-16 ENCOUNTER — Inpatient Hospital Stay

## 2023-11-16 VITALS — BP 119/64 | HR 87 | Temp 97.1°F | Resp 18 | Wt 152.0 lb

## 2023-11-16 DIAGNOSIS — Z923 Personal history of irradiation: Secondary | ICD-10-CM | POA: Diagnosis not present

## 2023-11-16 DIAGNOSIS — C9000 Multiple myeloma not having achieved remission: Secondary | ICD-10-CM | POA: Diagnosis not present

## 2023-11-16 DIAGNOSIS — Z510 Encounter for antineoplastic radiation therapy: Secondary | ICD-10-CM | POA: Diagnosis not present

## 2023-11-16 MED ORDER — DEXAMETHASONE 4 MG PO TABS
20.0000 mg | ORAL_TABLET | Freq: Once | ORAL | Status: AC
  Administered 2023-11-16: 20 mg via ORAL
  Filled 2023-11-16: qty 5

## 2023-11-16 MED ORDER — BORTEZOMIB CHEMO SQ INJECTION 3.5 MG (2.5MG/ML)
1.0000 mg/m2 | Freq: Once | INTRAMUSCULAR | Status: AC
  Administered 2023-11-16: 1.75 mg via SUBCUTANEOUS
  Filled 2023-11-16: qty 0.7

## 2023-11-16 NOTE — Progress Notes (Unsigned)
Patient presents today for chemotherapy infusion.  Patient is in satisfactory condition with no new complaints voiced.  Vital signs are stable.  Labs reviewed and all labs are within treatment parameters.  We will proceed with treatment per MD orders.

## 2023-11-16 NOTE — Patient Instructions (Signed)
 [facility] [place] - [facility] DE[facility] OF [facility]  Discharge Instructions: Thank you for choosing [facility] to provide your oncology and hematology care.  If you have a lab appointment with the Cancer Center - please note that after April 8th, 2024, all labs will be drawn in the cancer center.  You do not have to check in or register with the main entrance as you have in the past but will complete your check-in in the cancer center.  Wear comfortable clothing and clothing appropriate for easy access to any Portacath or PICC line.   We strive to give you quality time with your provider. You may need to reschedule your appointment if you arrive late (15 or more minutes).  Arriving late affects you and other patients whose appointments are after yours.  Also, if you miss three or more appointments without notifying the office, you may be dismissed from the clinic at the provider's discretion.      For prescription refill requests, have your pharmacy contact our office and allow 72 hours for refills to be completed.    Today you received the following chemotherapy and/or immunotherapy agents Velcade      To help prevent nausea and vomiting after your treatment, we encourage you to take your nausea medication as directed.  BELOW ARE SYMPTOMS THAT SHOULD BE REPORTED IMMEDIATELY: *FEVER GREATER THAN 100.4 F (38 C) OR HIGHER *CHILLS OR SWEATING *NAUSEA AND VOMITING THAT IS NOT CONTROLLED WITH YOUR NAUSEA MEDICATION *UNUSUAL SHORTNESS OF BREATH *UNUSUAL BRUISING OR BLEEDING *URINARY PROBLEMS (pain or burning when urinating, or frequent urination) *BOWEL PROBLEMS (unusual diarrhea, constipation, pain near the anus) TENDERNESS IN MOUTH AND THROAT WITH OR WITHOUT PRESENCE OF ULCERS (sore throat, sores in mouth, or a toothache) UNUSUAL RASH, SWELLING OR PAIN  UNUSUAL VAGINAL DISCHARGE OR ITCHING   Items with * indicate a potential emergency and should be followed up  as soon as possible or go to the Emergency Department if any problems should occur.  Please show the CHEMOTHERAPY ALERT CARD or IMMUNOTHERAPY ALERT CARD at check-in to the Emergency Department and triage nurse.  Should you have questions after your visit or need to cancel or reschedule your appointment, please contact [facility] [place] - A DEPT OF Eligha Bridegroom [facility] [phone]  and follow the prompts.  Office hours are 8:00 a.m. to 4:30 p.m. Monday - Friday. Please note that voicemails left after 4:00 p.m. may not be returned until the following business day.  We are closed weekends and major holidays. You have access to a nurse at all times for urgent questions. Please call the main number to the clinic [phone] and follow the prompts.  For any non-urgent questions, you may also contact your provider using MyChart. We now offer e-Visits for anyone 44 and older to request care online for non-urgent symptoms. For details visit [email or web address].   Also download the MyChart app! Go to the app store, search "MyChart", open the app, select [place], and log in with your MyChart username and password.

## 2023-11-21 ENCOUNTER — Inpatient Hospital Stay

## 2023-11-21 ENCOUNTER — Inpatient Hospital Stay: Attending: Hematology

## 2023-11-21 VITALS — BP 127/69 | HR 85 | Temp 97.0°F | Resp 18 | Wt 148.0 lb

## 2023-11-21 DIAGNOSIS — Z79899 Other long term (current) drug therapy: Secondary | ICD-10-CM | POA: Insufficient documentation

## 2023-11-21 DIAGNOSIS — Z5112 Encounter for antineoplastic immunotherapy: Secondary | ICD-10-CM | POA: Diagnosis not present

## 2023-11-21 DIAGNOSIS — C7951 Secondary malignant neoplasm of bone: Secondary | ICD-10-CM | POA: Diagnosis not present

## 2023-11-21 DIAGNOSIS — C9000 Multiple myeloma not having achieved remission: Secondary | ICD-10-CM | POA: Diagnosis not present

## 2023-11-21 LAB — COMPREHENSIVE METABOLIC PANEL WITH GFR
ALT: 29 U/L (ref 0–44)
AST: 22 U/L (ref 15–41)
Albumin: 3.1 g/dL — ABNORMAL LOW (ref 3.5–5.0)
Alkaline Phosphatase: 68 U/L (ref 38–126)
Anion gap: 13 (ref 5–15)
BUN: 23 mg/dL (ref 8–23)
CO2: 23 mmol/L (ref 22–32)
Calcium: 9.1 mg/dL (ref 8.9–10.3)
Chloride: 93 mmol/L — ABNORMAL LOW (ref 98–111)
Creatinine, Ser: 0.79 mg/dL (ref 0.44–1.00)
GFR, Estimated: >60 mL/min (ref >60)
Glucose, Bld: 119 mg/dL — ABNORMAL HIGH (ref 70–99)
Potassium: 3.6 mmol/L (ref 3.5–5.1)
Sodium: 129 mmol/L — ABNORMAL LOW (ref 135–145)
Total Bilirubin: 0.8 mg/dL (ref 0.0–1.2)
Total Protein: 6.3 g/dL — ABNORMAL LOW (ref 6.5–8.1)

## 2023-11-21 LAB — CBC WITH DIFFERENTIAL/PLATELET
Abs Immature Granulocytes: 0.04 K/uL (ref 0.00–0.07)
Basophils Absolute: 0 K/uL (ref 0.0–0.1)
Basophils Relative: 0 %
Eosinophils Absolute: 0 K/uL (ref 0.0–0.5)
Eosinophils Relative: 0 %
HCT: 35.5 % — ABNORMAL LOW (ref 36.0–46.0)
Hemoglobin: 12.4 g/dL (ref 12.0–15.0)
Immature Granulocytes: 1 %
Lymphocytes Relative: 11 %
Lymphs Abs: 0.6 K/uL — ABNORMAL LOW (ref 0.7–4.0)
MCH: 33.3 pg (ref 26.0–34.0)
MCHC: 34.9 g/dL (ref 30.0–36.0)
MCV: 95.4 fL (ref 80.0–100.0)
Monocytes Absolute: 0.6 K/uL (ref 0.1–1.0)
Monocytes Relative: 10 %
Neutro Abs: 4.4 K/uL (ref 1.7–7.7)
Neutrophils Relative %: 78 %
Platelets: 61 K/uL — ABNORMAL LOW (ref 150–400)
RBC: 3.72 MIL/uL — ABNORMAL LOW (ref 3.87–5.11)
RDW: 15.9 % — ABNORMAL HIGH (ref 11.5–15.5)
WBC: 5.6 K/uL (ref 4.0–10.5)
nRBC: 0 % (ref 0.0–0.2)

## 2023-11-21 MED ORDER — CETIRIZINE HCL 10 MG PO TABS
10.0000 mg | ORAL_TABLET | Freq: Once | ORAL | Status: AC
  Administered 2023-11-21: 10 mg via ORAL
  Filled 2023-11-21: qty 1

## 2023-11-21 MED ORDER — SODIUM CHLORIDE 0.9 % IV SOLN: Freq: Once | INTRAVENOUS | Status: AC

## 2023-11-21 MED ORDER — ACETAMINOPHEN 325 MG PO TABS
650.0000 mg | ORAL_TABLET | Freq: Once | ORAL | Status: AC
  Administered 2023-11-21: 650 mg via ORAL
  Filled 2023-11-21: qty 2

## 2023-11-21 MED ORDER — BORTEZOMIB CHEMO SQ INJECTION 3.5 MG (2.5MG/ML)
1.0000 mg/m2 | Freq: Once | INTRAMUSCULAR | Status: AC
  Administered 2023-11-21: 1.75 mg via SUBCUTANEOUS
  Filled 2023-11-21: qty 0.7

## 2023-11-21 MED ORDER — DARATUMUMAB-HYALURONIDASE-FIHJ 1800-30000 MG-UT/15ML ~~LOC~~ SOLN
1800.0000 mg | Freq: Once | SUBCUTANEOUS | Status: AC
  Administered 2023-11-21: 1800 mg via SUBCUTANEOUS
  Filled 2023-11-21: qty 15

## 2023-11-21 MED ORDER — DEXAMETHASONE 4 MG PO TABS
20.0000 mg | ORAL_TABLET | Freq: Once | ORAL | Status: AC
  Administered 2023-11-21: 20 mg via ORAL
  Filled 2023-11-21: qty 5

## 2023-11-21 NOTE — Progress Notes (Signed)
 Patient presents today for Velcade  and Daratumumab  injections.  Patient is very weak and complaining of dizziness and diarrhea.  Dr. Davonna made aware.  Vital signs are stable.  Labs reviewed.  Platelets today are 61.  Sodium is 129.  Patient denies confusion and patient's daughter confirms that she has not been confused.  All other labs are within treatment parameters.  We will give 1 L of NS over 2 hours per MD.  We will proceed with treatment per MD orders.    Patient tolerated treatment and IVF well with no complaints voiced.  Patient stated that she felt noticeably better post IVF.  Patient left via wheelchair with granddaughter in stable condition.  Vital signs stable at discharge.  Follow up as scheduled.

## 2023-11-21 NOTE — Patient Instructions (Signed)
 [facility] [place] [facility]  Discharge Instructions: Thank you for choosing [facility] to provide your oncology and hematology care.  If you have a lab appointment with the Cancer Center - please note that after April 8th, 2024, all labs will be drawn in the cancer center.  You do not have to check in or register with the main entrance as you have in the past but will complete your check-in in the cancer center.  Wear comfortable clothing and clothing appropriate for easy access to any Portacath or PICC line.   We strive to give you quality time with your provider. You may need to reschedule your appointment if you arrive late (15 or more minutes).  Arriving late affects you and other patients whose appointments are after yours.  Also, if you miss three or more appointments without notifying the office, you may be dismissed from the clinic at the provider's discretion.      For prescription refill requests, have your pharmacy contact our office and allow 72 hours for refills to be completed.    Today you received the following chemotherapy and/or immunotherapy agents Daratumumab/Velcade.  Daratumumab; Hyaluronidase Injection What is this medication? DARATUMUMAB; HYALURONIDASE (dar a toom ue mab; hye al ur ON i dase) treats multiple myeloma, a type of bone marrow cancer. Daratumumab works by blocking a protein that causes cancer cells to grow and multiply. This helps to slow or stop the spread of cancer cells. Hyaluronidase works by increasing the absorption of other medications in the body to help them work better. This medication may also be used treat amyloidosis, a condition that causes the buildup of a protein (amyloid) in your body. It works by reducing the buildup of this protein, which decreases symptoms. It is a combination medication that contains a monoclonal antibody. This medicine may be used for other purposes; ask your health care  provider or pharmacist if you have questions. COMMON BRAND NAME(S): DARZALEX FASPRO What should I tell my care team before I take this medication? They need to know if you have any of these conditions: Heart disease Infection, such as chickenpox, cold sores, herpes, hepatitis B Lung or breathing disease An unusual or allergic reaction to daratumumab, hyaluronidase, other medications, foods, dyes, or preservatives Pregnant or trying to get pregnant Breast-feeding How should I use this medication? This medication is injected under the skin. It is given by your care team in a hospital or clinic setting. Talk to your care team about the use of this medication in children. Special care may be needed. Overdosage: If you think you have taken too much of this medicine contact a poison control center or emergency room at once. NOTE: This medicine is only for you. Do not share this medicine with others. What if I miss a dose? Keep appointments for follow-up doses. It is important not to miss your dose. Call your care team if you are unable to keep an appointment. What may interact with this medication? Interactions have not been studied. This list may not describe all possible interactions. Give your health care provider a list of all the medicines, herbs, non-prescription drugs, or dietary supplements you use. Also tell them if you smoke, drink alcohol, or use illegal drugs. Some items may interact with your medicine. What should I watch for while using this medication? Your condition will be monitored carefully while you are receiving this medication. This medication can cause serious allergic  reactions. To reduce your risk, your care team may give you other medication to take before receiving this one. Be sure to follow the directions from your care team. This medication can affect the results of blood tests to match your blood type. These changes can last for up to 6 months after the final dose.  Your care team will do blood tests to match your blood type before you start treatment. Tell all of your care team that you are being treated with this medication before receiving a blood transfusion. This medication can affect the results of some tests used to determine treatment response; extra tests may be needed to evaluate response. Talk to your care team if you wish to become pregnant or think you are pregnant. This medication can cause serious birth defects if taken during pregnancy and for 3 months after the last dose. A reliable form of contraception is recommended while taking this medication and for 3 months after the last dose. Talk to your care team about effective forms of contraception. Do not breast-feed while taking this medication. What side effects may I notice from receiving this medication? Side effects that you should report to your care team as soon as possible: Allergic reactions--skin rash, itching, hives, swelling of the face, lips, tongue, or throat Heart rhythm changes--fast or irregular heartbeat, dizziness, feeling faint or lightheaded, chest pain, trouble breathing Infection--fever, chills, cough, sore throat, wounds that don't heal, pain or trouble when passing urine, general feeling of discomfort or being unwell Infusion reactions--chest pain, shortness of breath or trouble breathing, feeling faint or lightheaded Sudden eye pain or change in vision such as blurry vision, seeing halos around lights, vision loss Unusual bruising or bleeding Side effects that usually do not require medical attention (report to your care team if they continue or are bothersome): Constipation Diarrhea Fatigue Nausea Pain, tingling, or numbness in the hands or feet Swelling of the ankles, hands, or feet This list may not describe all possible side effects. Call your doctor for medical advice about side effects. You may report side effects to FDA at 1-800-FDA-1088. Where should I keep my  medication? This medication is given in a hospital or clinic. It will not be stored at home. NOTE: This sheet is a summary. It may not cover all possible information. If you have questions about this medicine, talk to your doctor, pharmacist, or health care provider.  2024 Elsevier/Gold Standard (2021-07-13 00:00:00)   Bortezomib Injection What is this medication? BORTEZOMIB (bor TEZ oh mib) treats lymphoma. It may also be used to treat multiple myeloma, a type of bone marrow cancer. It works by blocking a protein that causes cancer cells to grow and multiply. This helps to slow or stop the spread of cancer cells. This medicine may be used for other purposes; ask your health care provider or pharmacist if you have questions. COMMON BRAND NAME(S): BORUZU, Velcade What should I tell my care team before I take this medication? They need to know if you have any of these conditions: Dehydration Diabetes Heart disease Liver disease Tingling of the fingers or toes or other nerve disorder An unusual or allergic reaction to bortezomib, other medications, foods, dyes, or preservatives If you or your partner are pregnant or trying to get pregnant Breastfeeding How should I use this medication? This medication is injected into a vein or under the skin. It is given by your care team in a hospital or clinic setting. Talk to your care team about the use  of this medication in children. Special care may be needed. Overdosage: If you think you have taken too much of this medicine contact a poison control center or emergency room at once. NOTE: This medicine is only for you. Do not share this medicine with others. What if I miss a dose? Keep appointments for follow-up doses. It is important not to miss your dose. Call your care team if you are unable to keep an appointment. What may interact with this medication? Ketoconazole Rifampin This list may not describe all possible interactions. Give your  health care provider a list of all the medicines, herbs, non-prescription drugs, or dietary supplements you use. Also tell them if you smoke, drink alcohol, or use illegal drugs. Some items may interact with your medicine. What should I watch for while using this medication? Your condition will be monitored carefully while you are receiving this medication. You may need blood work while taking this medication. This medication may affect your coordination, reaction time, or judgment. Do not drive or operate machinery until you know how this medication affects you. Sit up or stand slowly to reduce the risk of dizzy or fainting spells. Drinking alcohol with this medication can increase the risk of these side effects. This medication may increase your risk of getting an infection. Call your care team for advice if you get a fever, chills, sore throat, or other symptoms of a cold or flu. Do not treat yourself. Try to avoid being around people who are sick. Check with your care team if you have severe diarrhea, nausea, and vomiting, or if you sweat a lot. The loss of too much body fluid may make it dangerous for you to take this medication. Talk to your care team if you may be pregnant. Serious birth defects can occur if you take this medication during pregnancy and for 7 months after the last dose. You will need a negative pregnancy test before starting this medication. Contraception is recommended while taking this medication and for 7 months after the last dose. Your care team can help you find the option that works for you. If your partner can get pregnant, use a condom during sex while taking this medication and for 4 months after the last dose. Do not breastfeed while taking this medication and for 2 months after the last dose. This medication may cause infertility. Talk to your care team if you are concerned about your fertility. What side effects may I notice from receiving this medication? Side effects  that you should report to your care team as soon as possible: Allergic reactions--skin rash, itching, hives, swelling of the face, lips, tongue, or throat Bleeding--bloody or black, tar-like stools, vomiting blood or Lakeesha Fontanilla material that looks like coffee grounds, red or dark Celie Desrochers urine, small red or purple spots on skin, unusual bruising or bleeding Bleeding in the brain--severe headache, stiff neck, confusion, dizziness, change in vision, numbness or weakness of the face, arm, or leg, trouble speaking, trouble walking, vomiting Bowel blockage--stomach cramping, unable to have a bowel movement or pass gas, loss of appetite, vomiting Heart failure--shortness of breath, swelling of the ankles, feet, or hands, sudden weight gain, unusual weakness or fatigue Infection--fever, chills, cough, sore throat, wounds that don't heal, pain or trouble when passing urine, general feeling of discomfort or being unwell Liver injury--right upper belly pain, loss of appetite, nausea, light-colored stool, dark yellow or Nivea Wojdyla urine, yellowing skin or eyes, unusual weakness or fatigue Low blood pressure--dizziness, feeling faint or lightheaded,  blurry vision Lung injury--shortness of breath or trouble breathing, cough, spitting up blood, chest pain, fever Pain, tingling, or numbness in the hands or feet Severe or prolonged diarrhea Stomach pain, bloody diarrhea, pale skin, unusual weakness or fatigue, decrease in the amount of urine, which may be signs of hemolytic uremic syndrome Sudden and severe headache, confusion, change in vision, seizures, which may be signs of posterior reversible encephalopathy syndrome (PRES) TTP--purple spots on the skin or inside the mouth, pale skin, yellowing skin or eyes, unusual weakness or fatigue, fever, fast or irregular heartbeat, confusion, change in vision, trouble speaking, trouble walking Tumor lysis syndrome (TLS)--nausea, vomiting, diarrhea, decrease in the amount of urine,  dark urine, unusual weakness or fatigue, confusion, muscle pain or cramps, fast or irregular heartbeat, joint pain Side effects that usually do not require medical attention (report to your care team if they continue or are bothersome): Constipation Diarrhea Fatigue Loss of appetite Nausea This list may not describe all possible side effects. Call your doctor for medical advice about side effects. You may report side effects to FDA at 1-800-FDA-1088. Where should I keep my medication? This medication is given in a hospital or clinic. It will not be stored at home. NOTE: This sheet is a summary. It may not cover all possible information. If you have questions about this medicine, talk to your doctor, pharmacist, or health care provider.  2024 Elsevier/Gold Standard (2021-08-10 00:00:00)       To help prevent nausea and vomiting after your treatment, we encourage you to take your nausea medication as directed.  BELOW ARE SYMPTOMS THAT SHOULD BE REPORTED IMMEDIATELY: *FEVER GREATER THAN 100.4 F (38 C) OR HIGHER *CHILLS OR SWEATING *NAUSEA AND VOMITING THAT IS NOT CONTROLLED WITH YOUR NAUSEA MEDICATION *UNUSUAL SHORTNESS OF BREATH *UNUSUAL BRUISING OR BLEEDING *URINARY PROBLEMS (pain or burning when urinating, or frequent urination) *BOWEL PROBLEMS (unusual diarrhea, constipation, pain near the anus) TENDERNESS IN MOUTH AND THROAT WITH OR WITHOUT PRESENCE OF ULCERS (sore throat, sores in mouth, or a toothache) UNUSUAL RASH, SWELLING OR PAIN  UNUSUAL VAGINAL DISCHARGE OR ITCHING   Items with * indicate a potential emergency and should be followed up as soon as possible or go to the Emergency Department if any problems should occur.  Please show the CHEMOTHERAPY ALERT CARD or IMMUNOTHERAPY ALERT CARD at check-in to the Emergency Department and triage nurse.  Should you have questions after your visit or need to cancel or reschedule your appointment, please contact [facility] [place]  - A DEPT OF Eligha Bridegroom [facility] [phone]  and follow the prompts.  Office hours are 8:00 a.m. to 4:30 p.m. Monday - Friday. Please note that voicemails left after 4:00 p.m. may not be returned until the following business day.  We are closed weekends and major holidays. You have access to a nurse at all times for urgent questions. Please call the main number to the clinic [phone] and follow the prompts.  For any non-urgent questions, you may also contact your provider using MyChart. We now offer e-Visits for anyone 13 and older to request care online for non-urgent symptoms. For details visit [email or web address].   Also download the MyChart app! Go to the app store, search "MyChart", open the app, select [place], and log in with your MyChart username and password.

## 2023-11-24 ENCOUNTER — Inpatient Hospital Stay

## 2023-11-24 ENCOUNTER — Encounter: Payer: Self-pay | Admitting: Oncology

## 2023-11-24 VITALS — BP 113/63 | HR 78 | Temp 97.9°F | Resp 18

## 2023-11-24 DIAGNOSIS — C9000 Multiple myeloma not having achieved remission: Secondary | ICD-10-CM

## 2023-11-24 DIAGNOSIS — C7951 Secondary malignant neoplasm of bone: Secondary | ICD-10-CM | POA: Diagnosis not present

## 2023-11-24 DIAGNOSIS — Z79899 Other long term (current) drug therapy: Secondary | ICD-10-CM | POA: Diagnosis not present

## 2023-11-24 DIAGNOSIS — Z5112 Encounter for antineoplastic immunotherapy: Secondary | ICD-10-CM | POA: Diagnosis not present

## 2023-11-24 MED ORDER — BORTEZOMIB CHEMO SQ INJECTION 3.5 MG (2.5MG/ML)
1.0000 mg/m2 | Freq: Once | INTRAMUSCULAR | Status: AC
  Administered 2023-11-24: 1.75 mg via SUBCUTANEOUS
  Filled 2023-11-24: qty 0.7

## 2023-11-24 MED ORDER — DEXAMETHASONE 4 MG PO TABS
20.0000 mg | ORAL_TABLET | Freq: Once | ORAL | Status: AC
  Administered 2023-11-24: 20 mg via ORAL
  Filled 2023-11-24: qty 5

## 2023-11-24 NOTE — Progress Notes (Signed)
 Patient presents today for Velcade  injection per providers order.  Vital signs and labs within parameters for injection.  Treatment given today per MD orders.  Stable during infusion without adverse affects.  Vital signs stable.  No complaints at this time.  Discharge from clinic ambulatory in stable condition.  Alert and oriented X 3.  Follow up with [facility] as scheduled.

## 2023-11-24 NOTE — Patient Instructions (Signed)
 [facility] [place] - [facility] DE[facility] OF [facility]  Discharge Instructions: Thank you for choosing [facility] to provide your oncology and hematology care.  If you have a lab appointment with the Cancer Center - please note that after April 8th, 2024, all labs will be drawn in the cancer center.  You do not have to check in or register with the main entrance as you have in the past but will complete your check-in in the cancer center.  Wear comfortable clothing and clothing appropriate for easy access to any Portacath or PICC line.   We strive to give you quality time with your provider. You may need to reschedule your appointment if you arrive late (15 or more minutes).  Arriving late affects you and other patients whose appointments are after yours.  Also, if you miss three or more appointments without notifying the office, you may be dismissed from the clinic at the provider's discretion.      For prescription refill requests, have your pharmacy contact our office and allow 72 hours for refills to be completed.    Today you received the following chemotherapy and/or immunotherapy agents Velcade      To help prevent nausea and vomiting after your treatment, we encourage you to take your nausea medication as directed.  BELOW ARE SYMPTOMS THAT SHOULD BE REPORTED IMMEDIATELY: *FEVER GREATER THAN 100.4 F (38 C) OR HIGHER *CHILLS OR SWEATING *NAUSEA AND VOMITING THAT IS NOT CONTROLLED WITH YOUR NAUSEA MEDICATION *UNUSUAL SHORTNESS OF BREATH *UNUSUAL BRUISING OR BLEEDING *URINARY PROBLEMS (pain or burning when urinating, or frequent urination) *BOWEL PROBLEMS (unusual diarrhea, constipation, pain near the anus) TENDERNESS IN MOUTH AND THROAT WITH OR WITHOUT PRESENCE OF ULCERS (sore throat, sores in mouth, or a toothache) UNUSUAL RASH, SWELLING OR PAIN  UNUSUAL VAGINAL DISCHARGE OR ITCHING   Items with * indicate a potential emergency and should be followed up  as soon as possible or go to the Emergency Department if any problems should occur.  Please show the CHEMOTHERAPY ALERT CARD or IMMUNOTHERAPY ALERT CARD at check-in to the Emergency Department and triage nurse.  Should you have questions after your visit or need to cancel or reschedule your appointment, please contact [facility] [place] - A DEPT OF Eligha Bridegroom [facility] [phone]  and follow the prompts.  Office hours are 8:00 a.m. to 4:30 p.m. Monday - Friday. Please note that voicemails left after 4:00 p.m. may not be returned until the following business day.  We are closed weekends and major holidays. You have access to a nurse at all times for urgent questions. Please call the main number to the clinic [phone] and follow the prompts.  For any non-urgent questions, you may also contact your provider using MyChart. We now offer e-Visits for anyone 44 and older to request care online for non-urgent symptoms. For details visit [email or web address].   Also download the MyChart app! Go to the app store, search "MyChart", open the app, select [place], and log in with your MyChart username and password.

## 2023-11-26 ENCOUNTER — Encounter (HOSPITAL_COMMUNITY): Payer: Self-pay

## 2023-11-26 ENCOUNTER — Observation Stay (HOSPITAL_COMMUNITY)
Admission: EM | Admit: 2023-11-26 | Discharge: 2023-11-28 | Disposition: A | Discharge status: Skilled Nursing Facility | Attending: Internal Medicine | Admitting: Internal Medicine

## 2023-11-26 ENCOUNTER — Emergency Department (HOSPITAL_COMMUNITY)

## 2023-11-26 ENCOUNTER — Other Ambulatory Visit: Payer: Self-pay

## 2023-11-26 DIAGNOSIS — R509 Fever, unspecified: Secondary | ICD-10-CM | POA: Insufficient documentation

## 2023-11-26 DIAGNOSIS — Z87891 Personal history of nicotine dependence: Secondary | ICD-10-CM | POA: Insufficient documentation

## 2023-11-26 DIAGNOSIS — Z79899 Other long term (current) drug therapy: Secondary | ICD-10-CM | POA: Insufficient documentation

## 2023-11-26 DIAGNOSIS — E872 Acidosis, unspecified: Secondary | ICD-10-CM

## 2023-11-26 DIAGNOSIS — E860 Dehydration: Secondary | ICD-10-CM | POA: Diagnosis not present

## 2023-11-26 DIAGNOSIS — R197 Diarrhea, unspecified: Principal | ICD-10-CM | POA: Insufficient documentation

## 2023-11-26 DIAGNOSIS — Z96652 Presence of left artificial knee joint: Secondary | ICD-10-CM | POA: Insufficient documentation

## 2023-11-26 DIAGNOSIS — I10 Essential (primary) hypertension: Secondary | ICD-10-CM | POA: Diagnosis not present

## 2023-11-26 DIAGNOSIS — B379 Candidiasis, unspecified: Secondary | ICD-10-CM | POA: Insufficient documentation

## 2023-11-26 DIAGNOSIS — D696 Thrombocytopenia, unspecified: Secondary | ICD-10-CM | POA: Diagnosis not present

## 2023-11-26 DIAGNOSIS — G629 Polyneuropathy, unspecified: Secondary | ICD-10-CM

## 2023-11-26 DIAGNOSIS — E871 Hypo-osmolality and hyponatremia: Secondary | ICD-10-CM | POA: Insufficient documentation

## 2023-11-26 DIAGNOSIS — N281 Cyst of kidney, acquired: Secondary | ICD-10-CM | POA: Diagnosis not present

## 2023-11-26 DIAGNOSIS — C9001 Multiple myeloma in remission: Secondary | ICD-10-CM | POA: Diagnosis not present

## 2023-11-26 DIAGNOSIS — R531 Weakness: Principal | ICD-10-CM | POA: Insufficient documentation

## 2023-11-26 DIAGNOSIS — R1084 Generalized abdominal pain: Secondary | ICD-10-CM | POA: Diagnosis not present

## 2023-11-26 DIAGNOSIS — C9000 Multiple myeloma not having achieved remission: Secondary | ICD-10-CM | POA: Diagnosis not present

## 2023-11-26 LAB — CBC WITH DIFFERENTIAL/PLATELET
Abs Immature Granulocytes: 0.16 K/uL — ABNORMAL HIGH (ref 0.00–0.07)
Basophils Absolute: 0 K/uL (ref 0.0–0.1)
Basophils Relative: 0 %
Eosinophils Absolute: 0 K/uL (ref 0.0–0.5)
Eosinophils Relative: 0 %
HCT: 38.9 % (ref 36.0–46.0)
Hemoglobin: 13 g/dL (ref 12.0–15.0)
Immature Granulocytes: 2 %
Lymphocytes Relative: 10 %
Lymphs Abs: 0.8 K/uL (ref 0.7–4.0)
MCH: 32.5 pg (ref 26.0–34.0)
MCHC: 33.4 g/dL (ref 30.0–36.0)
MCV: 97.3 fL (ref 80.0–100.0)
Monocytes Absolute: 1.8 K/uL — ABNORMAL HIGH (ref 0.1–1.0)
Monocytes Relative: 23 %
Neutro Abs: 5.1 K/uL (ref 1.7–7.7)
Neutrophils Relative %: 65 %
Platelets: 57 K/uL — ABNORMAL LOW (ref 150–400)
RBC: 4 MIL/uL (ref 3.87–5.11)
RDW: 16.2 % — ABNORMAL HIGH (ref 11.5–15.5)
WBC: 7.8 K/uL (ref 4.0–10.5)
nRBC: 1.5 % — ABNORMAL HIGH (ref 0.0–0.2)

## 2023-11-26 LAB — PROCALCITONIN: Procalcitonin: <0.1 ng/mL

## 2023-11-26 LAB — URINALYSIS, ROUTINE W REFLEX MICROSCOPIC
Bacteria, UA: NONE SEEN
Bilirubin Urine: NEGATIVE
Glucose, UA: NEGATIVE mg/dL
Ketones, ur: NEGATIVE mg/dL
Leukocytes,Ua: NEGATIVE
Nitrite: NEGATIVE
Protein, ur: NEGATIVE mg/dL
Specific Gravity, Urine: 1.023 (ref 1.005–1.030)
pH: 6 (ref 5.0–8.0)

## 2023-11-26 LAB — BASIC METABOLIC PANEL WITH GFR
Anion gap: 16 — ABNORMAL HIGH (ref 5–15)
BUN: 26 mg/dL — ABNORMAL HIGH (ref 8–23)
CO2: 19 mmol/L — ABNORMAL LOW (ref 22–32)
Calcium: 9.3 mg/dL (ref 8.9–10.3)
Chloride: 93 mmol/L — ABNORMAL LOW (ref 98–111)
Creatinine, Ser: 1 mg/dL (ref 0.44–1.00)
GFR, Estimated: 56 mL/min — ABNORMAL LOW (ref >60)
Glucose, Bld: 145 mg/dL — ABNORMAL HIGH (ref 70–99)
Potassium: 3.9 mmol/L (ref 3.5–5.1)
Sodium: 128 mmol/L — ABNORMAL LOW (ref 135–145)

## 2023-11-26 LAB — LACTIC ACID, PLASMA
Lactic Acid, Venous: 3.6 mmol/L (ref 0.5–1.9)
Lactic Acid, Venous: 2.4 mmol/L (ref 0.5–1.9)

## 2023-11-26 LAB — RESP PANEL BY RT-PCR (RSV, FLU A&B, COVID)  RVPGX2
Influenza A by PCR: NEGATIVE
Influenza B by PCR: NEGATIVE
Resp Syncytial Virus by PCR: NEGATIVE
SARS Coronavirus 2 by RT PCR: NEGATIVE

## 2023-11-26 LAB — FOLATE: Folate: 10.6 ng/mL (ref >5.9)

## 2023-11-26 LAB — MAGNESIUM: Magnesium: 2.5 mg/dL — ABNORMAL HIGH (ref 1.7–2.4)

## 2023-11-26 LAB — TSH: TSH: 1.197 u[IU]/mL (ref 0.350–4.500)

## 2023-11-26 LAB — VITAMIN B12: Vitamin B-12: 1877 pg/mL — ABNORMAL HIGH (ref 180–914)

## 2023-11-26 LAB — TROPONIN I (HIGH SENSITIVITY): Troponin I (High Sensitivity): 5 ng/L (ref <18)

## 2023-11-26 MED ORDER — ONDANSETRON HCL 4 MG/2ML IJ SOLN: 4.0000 mg | Freq: Four times a day (QID) | INTRAMUSCULAR | Status: DC | PRN

## 2023-11-26 MED ORDER — ONDANSETRON HCL 4 MG/2ML IJ SOLN
4.0000 mg | Freq: Once | INTRAMUSCULAR | Status: AC
  Administered 2023-11-26: 4 mg via INTRAVENOUS
  Filled 2023-11-26: qty 2

## 2023-11-26 MED ORDER — ACETAMINOPHEN 650 MG RE SUPP: 650.0000 mg | Freq: Four times a day (QID) | RECTAL | Status: DC | PRN

## 2023-11-26 MED ORDER — ONDANSETRON HCL 4 MG PO TABS
4.0000 mg | ORAL_TABLET | Freq: Four times a day (QID) | ORAL | Status: DC | PRN
Start: 2023-11-26 — End: 2023-11-28

## 2023-11-26 MED ORDER — IOHEXOL 300 MG/ML  SOLN
100.0000 mL | Freq: Once | INTRAMUSCULAR | Status: AC | PRN
  Administered 2023-11-26: 100 mL via INTRAVENOUS

## 2023-11-26 MED ORDER — LACTATED RINGERS IV BOLUS
500.0000 mL | Freq: Once | INTRAVENOUS | Status: AC
  Administered 2023-11-26: 500 mL via INTRAVENOUS

## 2023-11-26 MED ORDER — SODIUM CHLORIDE 0.9 % IV BOLUS
1000.0000 mL | Freq: Once | INTRAVENOUS | Status: AC
  Administered 2023-11-26: 1000 mL via INTRAVENOUS

## 2023-11-26 MED ORDER — POTASSIUM CHLORIDE IN NACL 20-0.9 MEQ/L-% IV SOLN: INTRAVENOUS | Status: AC

## 2023-11-26 MED ORDER — SODIUM CHLORIDE 0.9 % IV SOLN
1.0000 g | Freq: Once | INTRAVENOUS | Status: AC
  Administered 2023-11-26: 1 g via INTRAVENOUS
  Filled 2023-11-26: qty 10

## 2023-11-26 MED ORDER — ACETAMINOPHEN 325 MG PO TABS: 650.0000 mg | ORAL_TABLET | Freq: Four times a day (QID) | ORAL | Status: DC | PRN

## 2023-11-26 MED ORDER — CLOTRIMAZOLE 10 MG MT TROC
10.0000 mg | Freq: Every day | OROMUCOSAL | Status: DC
  Administered 2023-11-26 – 2023-11-28 (×9): 10 mg via ORAL
  Filled 2023-11-26 (×15): qty 1

## 2023-11-26 NOTE — H&P (Signed)
 History and Physical    Patient: Jamie Ewing FMW:[id number] DOB: February 22, 1941 DOA: 11/26/2023 DOS: the patient was seen and examined on 11/26/2023 PCP: Sheryle Carwin, MD  Patient coming from: Home  Chief Complaint:  Chief Complaint  Patient presents with   Weakness   HPI: Jamie Ewing is a 83 year old female with a history of myeloma, aortic valve stenosis hypertension, and peripheral neuropathy presenting with generalized weakness x 2 days.  Received her usual daratumumab  +Velcade  +dexamethasone  for myeloma on 11/24/2023.  He stated that she began feeling tired with some generalized weakness on the following day.  She had some dizziness when she gets up from a recumbent position.  She denies any syncope.  She has had some subjective fevers and chills.  She denies any chest pain, shortness breath, coughing, hemoptysis, nausea, vomiting.  She has had loose stools for the better part of a month.  There is no hematochezia or melena.  She states that she takes loperamide on a daily basis because of her loose stool.  She has some lower abdominal pain and cramping.  She denies any frank dysuria or hematuria.  At baseline, the patient uses a rollator, and she is able to get up out of bed on her own.  She denies any other new medications.  Appetite has been poor with some nausea since her last chemotherapy. In the ED, the patient was afebrile hemodynamically stable with oxygen saturation 100% room air.  WBC 7.8, hemoglobin 13.0, platelet 57,000.  Sodium 128, potassium 3.9, bicarbonate 19, serum creatinine 1.00.  Magnesium  2.5.  Lactic acid 3.6>> 2.4.  CT abdomen pelvis was negative for any acute findings.  There were innumerable lytic lesions in her visible skeleton compatible with her history of myeloma.  COVID-19 PCR is negative.  The patient was given 1.5 L of fluid and started on ceftriaxone .    Stage II IgA kappa multiple myeloma  s/p several local XRT's, induction with RVD followed by bone marrow  transplant in 2020.   currently on daratumumab  +Velcade  +dexamethasone    Review of Systems: As mentioned in the history of present illness. All other systems reviewed and are negative. Past Medical History:  Diagnosis Date   Back pain    Cancer (HCC)    mult myeloma 2019   Hypercholesteremia    Hypertension    Recurrent multiple myeloma of bone marrow with unknown EBV status (HCC)    Past Surgical History:  Procedure Laterality Date   BONE MARROW BIOPSY     LAMINECTOMY N/A 10/06/2017   Procedure: THORACIC NINE AND TEN LAMINECTOMY WITH RESECTION OF TUMOR, THORACIC EIGHT TO THORACIC ELEVEN FUSION WITH PEDICLE SCREW FIXATION;  Surgeon: Louis Shove, MD;  Location: [facility] OR;  Service: Neurosurgery;  Laterality: N/A;   REPLACEMENT TOTAL KNEE Left 2003   TOTAL KNEE ARTHROPLASTY  2001   Social History:  reports that she quit smoking about 17 years ago. Her smoking use included cigarettes. She started smoking about 47 years ago. She has a 15 pack-year smoking history. She has never used smokeless tobacco. She reports current alcohol use. She reports that she does not use drugs.  No Known Allergies  Family History  Problem Relation Age of Onset   Tuberculosis Mother    Kidney failure Father    Hypertension Paternal Aunt    Diabetes Paternal Aunt    Hypertension Paternal Uncle    Diabetes Paternal Uncle    Hypertension Daughter    Stroke Daughter     Prior to  Admission medications   Medication Sig Start Date End Date Taking? Authorizing Provider  acetaminophen  (TYLENOL ) 500 MG tablet Take 1,000 mg by mouth at bedtime. 10/21/19   [provider]  acyclovir  (ZOVIRAX ) 400 MG tablet Take 1 tablet (400 mg total) by mouth 2 (two) times daily. 09/26/23   Rogers Hai, MD  amLODipine  (NORVASC ) 5 MG tablet Take 5 mg by mouth daily.  05/13/11   [provider]  calcium carbonate (OSCAL) 1500 (600 Ca) MG TABS tablet Take 600 mg of elemental calcium by mouth daily with breakfast.  04/17/18   [provider]  dexamethasone  (DECADRON ) 4 MG tablet Take 1 tablet (4 mg total) by mouth once a week. Take 20 mg by mouth weekly the day after darzalex  injection 09/26/23   Rogers Hai, MD  diltiazem  (CARDIZEM  SR) 60 MG 12 hr capsule Take 1 capsule (60 mg total) by mouth 2 (two) times daily. 09/12/23   Mallipeddi, Vishnu P, MD  furosemide  (LASIX ) 40 MG tablet Take 1 tablet (40 mg total) by mouth daily as needed. 11/13/23   Davonna Siad, MD  hydrochlorothiazide  (HYDRODIURIL ) 12.5 MG tablet Take 12.5 mg by mouth daily.    [provider]  LORazepam (ATIVAN) 0.5 MG tablet Take 0.25-0.5 mg by mouth 2 (two) times daily as needed for anxiety. 08/16/22   [provider]  LOW-DOSE ASPIRIN PO Take 81 mg by mouth at bedtime.    [provider]  metoprolol  tartrate (LOPRESSOR ) 25 MG tablet Take 25 mg by mouth 2 (two) times daily. 09/12/23   [provider]  Multiple Vitamins-Minerals (THERA-M) TABS Take 1 tablet by mouth daily.    [provider]  potassium chloride  SA (KLOR-CON  M) 20 MEQ tablet TAKE 1 TABLET TWICE DAILY 04/10/23   Rogers Hai, MD  prochlorperazine  (COMPAZINE ) 10 MG tablet Take 1 tablet (10 mg total) by mouth every 6 (six) hours as needed. Patient taking differently: Take 10 mg by mouth every 6 (six) hours as needed for vomiting or nausea. 09/26/23   Katragadda, Sreedhar, MD  vitamin B-12 (CYANOCOBALAMIN ) 1000 MCG tablet Take 1,000 mcg by mouth daily.    [provider]    Physical Exam: Vitals:   11/26/23 1002 11/26/23 1002 11/26/23 1044 11/26/23 1311  BP:  (!) 106/55  (!) 120/54  Pulse:  75 69 73  Resp:  16 12 13   Temp:  98 F (36.7 C)    TempSrc:  Oral    SpO2:  100% 98% 99%  Weight: 67.6 kg     Height: 5' 3 (1.6 m)      GENERAL:  A&O x 3, NAD, well developed, cooperative, follows commands HEENT: [place]/AT, No thrush, No icterus, No oral ulcers Neck:  No neck mass, No meningismus, soft,  supple CV: RRR, no S3, no S4, no rub, no JVD Lungs:  CTA, no wheeze, no rhonchi, good air movement Abd: soft/NT +BS, nondistended Ext: No edema, no lymphangitis, no cyanosis, no rashes Neuro:  CN II-XII intact, strength 4/5 in RUE, RLE, strength 4/5 LUE, LLE; sensation intact bilateral; no dysmetria; babinski equivocal  Data Reviewed: {Data reviewed above in history  Assessment and Plan: Generalized weakness/dehydration -Continue IV fluids - TSH - B12 - Folate - PT eval - CPK -9/7 UA--no pyuria  Diarrhea - Stool for C. Difficile - Stool pathogen panel  Essential hypertension - Holding metoprolol  secondary to soft blood pressure - Holding HCTZ and amlodipine  secondary to soft blood pressure  Stage II IgA kappa multiple myeloma  -  s/p several local XRT's, induction with RVD followed by -bone marrow transplant in 2020.   currently on daratumumab  +Velcade  +dexamethasone    Thrush -mycelex  troches     Advance Care Planning: FULL  Consults: none  Family Communication: daughter 9/7  Severity of Illness: The appropriate patient status for this patient is OBSERVATION. Observation status is judged to be reasonable and necessary in order to provide the required intensity of service to ensure the patient's safety. The patient's presenting symptoms, physical exam findings, and initial radiographic and laboratory data in the context of their medical condition is felt to place them at decreased risk for further clinical deterioration. Furthermore, it is anticipated that the patient will be medically stable for discharge from the hospital within 2 midnights of admission.   Author: Alm Schneider, MD 11/26/2023 3:01 PM  For on call review [email or web address].

## 2023-11-26 NOTE — Hospital Course (Addendum)
 83 year old female with a history of myeloma, aortic valve stenosis hypertension, and peripheral neuropathy presenting with generalized weakness x 2 days.  Received her usual daratumumab  +Velcade  +dexamethasone  for myeloma on 11/24/2023.  He stated that she began feeling tired with some generalized weakness on the following day.  She had some dizziness when she gets up from a recumbent position.  She denies any syncope.  She has had some subjective fevers and chills.  She denies any chest pain, shortness breath, coughing, hemoptysis, nausea, vomiting.  She has had loose stools for the better part of a month.  There is no hematochezia or melena.  She states that she takes loperamide on a daily basis because of her loose stool.  She has some lower abdominal pain and cramping.  She denies any frank dysuria or hematuria.  At baseline, the patient uses a rollator, and she is able to get up out of bed on her own.  She denies any other new medications.  Appetite has been poor with some nausea since her last chemotherapy. In the ED, the patient was afebrile hemodynamically stable with oxygen saturation 100% room air.  WBC 7.8, hemoglobin 13.0, platelet 57,000.  Sodium 128, potassium 3.9, bicarbonate 19, serum creatinine 1.00.  Magnesium  2.5.  Lactic acid 3.6>> 2.4.  CT abdomen pelvis was negative for any acute findings.  There were innumerable lytic lesions in her visible skeleton compatible with her history of myeloma.  COVID-19 PCR is negative.  The patient was given 1.5 L of fluid and started on ceftriaxone .    Stage II IgA kappa multiple myeloma  s/p several local XRT's, induction with RVD followed by bone marrow transplant in 2020.   currently on daratumumab  +Velcade  +dexamethasone 

## 2023-11-26 NOTE — Care Management Obs Status (Signed)
 MEDICARE OBSERVATION STATUS NOTIFICATION   Patient Details  Name: Jamie Ewing MRN: [id number] Date of Birth: May 14, 1940   Medicare Observation Status Notification Given:  Yes    Nena LITTIE Coffee, RN 11/26/2023, 6:05 PM

## 2023-11-26 NOTE — ED Triage Notes (Signed)
 Pt recently had a chemo tx on Friday and stated that she has been going down hill since. Stated that she is too weak to do anything, has body aches and is having n/d

## 2023-11-26 NOTE — ED Provider Notes (Signed)
 [place] EMERGENCY DEPARTMENT AT [facility] Provider Note   CSN: [id number] Arrival date & time: 11/26/23  9045     Patient presents with: Weakness   Jamie Ewing is a 83 y.o. female.   Patient is an 83 year old female who presents emergency department chief complaint of generalized weakness after her chemotherapy treatment 2 days ago.  Patient notes that she has been experiencing nausea without vomiting.  She notes that she has had poor p.o. intake.  She admits to associated body aches but denies any other focal areas of pain.  She has had no chest pain or shortness of breath.  She denies any abdominal pain.  She has had some associated diarrhea.  There has been no dysuria or hematuria.  She denies any dizziness, lightheadedness or syncope.   Weakness      Prior to Admission medications   Medication Sig Start Date End Date Taking? Authorizing Provider  acetaminophen  (TYLENOL ) 500 MG tablet Take 1,000 mg by mouth at bedtime. 10/21/19   [provider]  acyclovir  (ZOVIRAX ) 400 MG tablet Take 1 tablet (400 mg total) by mouth 2 (two) times daily. 09/26/23   Rogers Hai, MD  amLODipine  (NORVASC ) 5 MG tablet Take 5 mg by mouth daily.  05/13/11   [provider]  calcium carbonate (OSCAL) 1500 (600 Ca) MG TABS tablet Take 600 mg of elemental calcium by mouth daily with breakfast. 04/17/18   [provider]  dexamethasone  (DECADRON ) 4 MG tablet Take 1 tablet (4 mg total) by mouth once a week. Take 20 mg by mouth weekly the day after darzalex  injection 09/26/23   Rogers Hai, MD  diltiazem  (CARDIZEM  SR) 60 MG 12 hr capsule Take 1 capsule (60 mg total) by mouth 2 (two) times daily. 09/12/23   Mallipeddi, Vishnu P, MD  furosemide  (LASIX ) 40 MG tablet Take 1 tablet (40 mg total) by mouth daily as needed. 11/13/23   Kandala, Hyndavi, MD  hydrochlorothiazide  (HYDRODIURIL ) 12.5 MG tablet Take 12.5 mg by mouth daily.    [provider]   LORazepam (ATIVAN) 0.5 MG tablet Take 0.25-0.5 mg by mouth 2 (two) times daily as needed for anxiety. 08/16/22   [provider]  LOW-DOSE ASPIRIN PO Take 81 mg by mouth at bedtime.    [provider]  metoprolol  tartrate (LOPRESSOR ) 25 MG tablet Take 25 mg by mouth 2 (two) times daily. 09/12/23   [provider]  Multiple Vitamins-Minerals (THERA-M) TABS Take 1 tablet by mouth daily.    [provider]  potassium chloride  SA (KLOR-CON  M) 20 MEQ tablet TAKE 1 TABLET TWICE DAILY 04/10/23   Rogers Hai, MD  prochlorperazine  (COMPAZINE ) 10 MG tablet Take 1 tablet (10 mg total) by mouth every 6 (six) hours as needed. Patient taking differently: Take 10 mg by mouth every 6 (six) hours as needed for vomiting or nausea. 09/26/23   Katragadda, Sreedhar, MD  vitamin B-12 (CYANOCOBALAMIN ) 1000 MCG tablet Take 1,000 mcg by mouth daily.    [provider]    Allergies: Patient has no known allergies.    Review of Systems  Neurological:  Positive for weakness.  All other systems reviewed and are negative.   Updated Vital Signs BP (!) 106/55 (BP Location: Left Arm)   Pulse 75   Temp 98 F (36.7 C) (Oral)   Resp 16   Ht 5' 3 (1.6 m)   Wt 67.6 kg   SpO2 100%   BMI 26.39 kg/m   Physical Exam  Vitals and nursing note reviewed.  Constitutional:      General: She is not in acute distress.    Appearance: Normal appearance. She is not ill-appearing.  HENT:     Head: Normocephalic and atraumatic.     Nose: Nose normal.     Mouth/Throat:     Mouth: Mucous membranes are moist.  Eyes:     Extraocular Movements: Extraocular movements intact.     Conjunctiva/sclera: Conjunctivae normal.     Pupils: Pupils are equal, round, and reactive to light.  Cardiovascular:     Rate and Rhythm: Normal rate and regular rhythm.     Pulses: Normal pulses.     Heart sounds: Normal heart sounds. No murmur heard.    No gallop.  Pulmonary:     Effort: Pulmonary  effort is normal. No respiratory distress.     Breath sounds: Normal breath sounds. No stridor. No wheezing, rhonchi or rales.  Abdominal:     General: Abdomen is flat. Bowel sounds are normal. There is no distension.     Palpations: Abdomen is soft.     Tenderness: There is no abdominal tenderness. There is no guarding.  Musculoskeletal:        General: Normal range of motion.     Cervical back: Normal range of motion and neck supple. No rigidity or tenderness.     Right lower leg: No edema.     Left lower leg: No edema.  Skin:    General: Skin is warm and dry.     Findings: No bruising or rash.  Neurological:     General: No focal deficit present.     Mental Status: She is alert and oriented to person, place, and time. Mental status is at baseline.     Cranial Nerves: No cranial nerve deficit.     Sensory: No sensory deficit.     Coordination: Coordination normal.     Gait: Gait normal.  Psychiatric:        Mood and Affect: Mood normal.        Behavior: Behavior normal.        Thought Content: Thought content normal.        Judgment: Judgment normal.     (all labs ordered are listed, but only abnormal results are displayed) Labs Reviewed  RESP PANEL BY RT-PCR (RSV, FLU A&B, COVID)  [id number]  CBC WITH DIFFERENTIAL/PLATELET  BASIC METABOLIC PANEL WITH GFR  URINALYSIS, ROUTINE W REFLEX MICROSCOPIC  MAGNESIUM   LACTIC ACID, PLASMA  LACTIC ACID, PLASMA  TROPONIN I (HIGH SENSITIVITY)    EKG: None  Radiology: No results found.   Procedures   Medications Ordered in the ED  sodium chloride  0.9 % bolus 1,000 mL (has no administration in time range)  ondansetron  (ZOFRAN ) injection 4 mg (has no administration in time range)                                    Medical Decision Making Amount and/or Complexity of Data Reviewed Labs: ordered. Radiology: ordered.  Risk Prescription drug management. Decision regarding hospitalization.   This patient presents to the ED  for concern of weakness, nausea, this involves an extensive number of treatment options, and is a complaint that carries with it a high risk of complications and morbidity.  The differential diagnosis includes sepsis, pneumonia, urinary tract infection, dehydration, acute kidney injury, electrolyte derangement, acute appendicitis, cholecystitis, bowel obstruction, diverticulitis, pyelonephritis,  kidney stone, pancreatitis, mesenteric ischemia, medication reaction   Co morbidities that complicate the patient evaluation  Multiple myeloma   Additional history obtained:  Additional history obtained from medical records External records from outside source obtained and reviewed including records   Lab Tests:  I Ordered, and personally interpreted labs.  The pertinent results include: No leukocytosis, no anemia, noted thrombocytopenia at baseline, hyponatremia, low bicarb and elevated anion gap, hypomagnesemia, unremarkable urinalysis, downtrending lactic acid, negative troponin, negative viral swab   Imaging Studies ordered:  I ordered imaging studies including chest x-ray, CT scan abdomen and pelvis I independently visualized and interpreted imaging which showed no acute cardiopulmonary process, no acute intra-abdominal surgical process I agree with the radiologist interpretation   Cardiac Monitoring: / EKG:  The patient was maintained on a cardiac monitor.  I personally viewed and interpreted the cardiac monitored which showed an underlying rhythm of: Normal sinus rhythm, no ST/T wave changes, no ischemic changes, no STEMI   Consultations Obtained:  I requested consultation with the oncology, Dr. Davonna,  and discussed lab and imaging findings as well as pertinent plan - they recommend: Continue infectious workup, IV hydration, admission   Problem List / ED Course / Critical interventions / Medication management  Patient is doing well at this time.  Discussed with the patient and  family that we will plan for admission to the hospitalist service given her apparent dehydration, elevated lactic acidosis and possible underlying infection.  No clear infectious source has been demonstrated at this point.  Patient does continue have stable vital signs with no tachycardia and no fever.  Blood pressure has improved with IV fluids.  Lactic acid is downtrending at this point.  CT scan of the abdomen pelvis was unremarkable and chest x-ray demonstrated no acute cardiopulmonary process.  Have discussed patient case with Dr. Virgia with the hospital service who has excepted for admission. I ordered medication including IV fluids, Rocephin , Zofran  for nausea, weakness, lactic acidosis Reevaluation of the patient after these medicines showed that the patient improved I have reviewed the patients home medicines and have made adjustments as needed   Social Determinants of Health:  None   Test / Admission - Considered:  Admission     Final diagnoses:  None    ED Discharge Orders     None          Daralene Lonni [name] DEVONNA 11/26/23 1451    Suzette Pac, MD 11/30/23 1128

## 2023-11-26 NOTE — ED Notes (Signed)
Pt aware of need for urine sample. Gracee Ratterree Eliceo Gladu RN 

## 2023-11-26 NOTE — Progress Notes (Signed)
   11/26/23 1803  TOC Brief Assessment  Insurance and Status Reviewed  Patient has primary care physician Yes  Home environment has been reviewed From home, lives c/daughter  Prior level of function: Minimal assist  Prior/Current Home Services No current home services  Social Drivers of Health Review SDOH reviewed no interventions necessary  Readmission risk has been reviewed Yes  Transition of care needs no transition of care needs at this time   Transition of Care Department [facility]) has reviewed patient and no TOC needs have been identified at this time. We will continue to monitor patient advancement through interdisciplinary progression rounds. If new patient transition needs arise, please place a TOC consult.

## 2023-11-27 ENCOUNTER — Other Ambulatory Visit: Payer: Self-pay

## 2023-11-27 DIAGNOSIS — C9000 Multiple myeloma not having achieved remission: Secondary | ICD-10-CM | POA: Diagnosis not present

## 2023-11-27 DIAGNOSIS — R531 Weakness: Secondary | ICD-10-CM | POA: Diagnosis not present

## 2023-11-27 DIAGNOSIS — D696 Thrombocytopenia, unspecified: Secondary | ICD-10-CM | POA: Diagnosis not present

## 2023-11-27 LAB — COMPREHENSIVE METABOLIC PANEL WITH GFR
ALT: 20 U/L (ref 0–44)
AST: 16 U/L (ref 15–41)
Albumin: 2.6 g/dL — ABNORMAL LOW (ref 3.5–5.0)
Alkaline Phosphatase: 51 U/L (ref 38–126)
Anion gap: 6 (ref 5–15)
BUN: 20 mg/dL (ref 8–23)
CO2: 23 mmol/L (ref 22–32)
Calcium: 8.2 mg/dL — ABNORMAL LOW (ref 8.9–10.3)
Chloride: 103 mmol/L (ref 98–111)
Creatinine, Ser: 0.7 mg/dL (ref 0.44–1.00)
GFR, Estimated: >60 mL/min (ref >60)
Glucose, Bld: 110 mg/dL — ABNORMAL HIGH (ref 70–99)
Potassium: 3.9 mmol/L (ref 3.5–5.1)
Sodium: 132 mmol/L — ABNORMAL LOW (ref 135–145)
Total Bilirubin: 0.4 mg/dL (ref 0.0–1.2)
Total Protein: 5.1 g/dL — ABNORMAL LOW (ref 6.5–8.1)

## 2023-11-27 LAB — CBC
HCT: 31.3 % — ABNORMAL LOW (ref 36.0–46.0)
Hemoglobin: 10.7 g/dL — ABNORMAL LOW (ref 12.0–15.0)
MCH: 33 pg (ref 26.0–34.0)
MCHC: 34.2 g/dL (ref 30.0–36.0)
MCV: 96.6 fL (ref 80.0–100.0)
Platelets: 48 K/uL — ABNORMAL LOW (ref 150–400)
RBC: 3.24 MIL/uL — ABNORMAL LOW (ref 3.87–5.11)
RDW: 16.6 % — ABNORMAL HIGH (ref 11.5–15.5)
WBC: 4.4 K/uL (ref 4.0–10.5)
nRBC: 2.3 % — ABNORMAL HIGH (ref 0.0–0.2)

## 2023-11-27 LAB — CORTISOL: Cortisol, Plasma: 8.2 ug/dL

## 2023-11-27 LAB — T4, FREE: Free T4: 1.24 ng/dL — ABNORMAL HIGH (ref 0.61–1.12)

## 2023-11-27 LAB — LACTIC ACID, PLASMA: Lactic Acid, Venous: 1.4 mmol/L (ref 0.5–1.9)

## 2023-11-27 MED ORDER — SODIUM CHLORIDE 0.9 % IV BOLUS
500.0000 mL | Freq: Once | INTRAVENOUS | Status: AC
  Administered 2023-11-27: 500 mL via INTRAVENOUS

## 2023-11-27 NOTE — Evaluation (Signed)
 Physical Therapy Evaluation Patient Details Name: Jamie Ewing MRN: [id number] DOB: 1940-03-22 Today's Date: 11/27/2023  History of Present Illness  Jamie Ewing is a 83 year old female with a history of myeloma, aortic valve stenosis hypertension, and peripheral neuropathy presenting with generalized weakness x 2 days.  Received her usual daratumumab  +Velcade  +dexamethasone  for myeloma on 11/24/2023.  He stated that she began feeling tired with some generalized weakness on the following day.  She had some dizziness when she gets up from a recumbent position.  She denies any syncope.  She has had some subjective fevers and chills.  She denies any chest pain, shortness breath, coughing, hemoptysis, nausea, vomiting.  She has had loose stools for the better part of a month.  There is no hematochezia or melena.  She states that she takes loperamide on a daily basis because of her loose stool.  She has some lower abdominal pain and cramping.  She denies any frank dysuria or hematuria.  At baseline, the patient uses a rollator, and she is able to get up out of bed on her own.  She denies any other new medications.  Appetite has been poor with some nausea since her last chemotherapy. (per MD)   Clinical Impression  Patient demonstrates slow labored movement for sitting up at bedside secondary to core weakness requiring use of bed rails and minA for upright positioning and scooting EOB. Patient demonstrates increased difficulty with STS transfer due to BIL LE weakness that impacts her ability to efficiently power up from standard height surfaces. Patient limited to sidestepping at bedside using RW mostly due to decreased endurance for functional activity. Patient did have c/o of dizziness with movement, but BP remained within therapeutic range throughout the session. Patient will benefit from continued skilled physical therapy in hospital and recommended venue below to increase strength, balance, endurance for  safe ADLs and gait.        If plan is discharge home, recommend the following: A lot of help with walking and/or transfers;A lot of help with bathing/dressing/bathroom;Assist for transportation;Help with stairs or ramp for entrance   Can travel by private vehicle   No    Equipment Recommendations None recommended by PT  Recommendations for Other Services       Functional Status Assessment Patient has had a recent decline in their functional status and demonstrates the ability to make significant improvements in function in a reasonable and predictable amount of time.     Precautions / Restrictions Precautions Precautions: Fall Recall of Precautions/Restrictions: Intact Restrictions Weight Bearing Restrictions Per Provider Order: No      Mobility  Bed Mobility Overal bed mobility: Needs Assistance Bed Mobility: Supine to Sit     Supine to sit: Min assist, Used rails     General bed mobility comments: labored movement, increased time; minA pull to sit    Transfers Overall transfer level: Needs assistance Equipment used: Rolling walker (2 wheels) Transfers: Sit to/from Stand, Bed to chair/wheelchair/BSC Sit to Stand: Min assist, Mod assist   Step pivot transfers: Min assist       General transfer comment: Increased time; minA to boost from standard height surface due to difficulty with initiation    Ambulation/Gait Ambulation/Gait assistance: Min assist Gait Distance (Feet): 3 Feet Assistive device: Rolling walker (2 wheels) Gait Pattern/deviations: Trunk flexed, Decreased stride length, Decreased step length - right, Decreased step length - left Gait velocity: Decreased     General Gait Details: Limited to sidestepping at bedside due to  BIL LE weakness, poor endurance  Stairs            Wheelchair Mobility     Tilt Bed    Modified Rankin (Stroke Patients Only)       Balance Overall balance assessment: Needs assistance Sitting-balance  support: Feet supported, No upper extremity supported Sitting balance-Leahy Scale: Fair Sitting balance - Comments: fair/good seated EOB   Standing balance support: Bilateral upper extremity supported, During functional activity, Reliant on assistive device for balance Standing balance-Leahy Scale: Fair Standing balance comment: using RW                             Pertinent Vitals/Pain Pain Assessment Pain Assessment: No/denies pain    Home Living Family/patient expects to be discharged to:: Private residence Living Arrangements: Children;Alone (daughter and granddaughter live in unit above her) Available Help at Discharge: Family;Available PRN/intermittently Type of Home: House (Duplex) Home Access: Level entry       Home Layout: One level Home Equipment: Rollator (4 wheels);Shower seat;Toilet riser;Grab bars - tub/shower Additional Comments: Pt lives alone with PRN help from daughter and granddaughter who live in the upstairs unit of the duplex    Prior Function Prior Level of Function : Needs assist             Mobility Comments: Pt uses rollator for short distance and community ambulation, supervision for longer distance; can usually get out of bed on her own ADLs Comments: Pt reports steady decline over last several months; increasing assistance needed for ADLs and IADLs from granddaughter/daughter     Extremity/Trunk Assessment        Lower Extremity Assessment Lower Extremity Assessment: Generalized weakness    Cervical / Trunk Assessment Cervical / Trunk Assessment: Kyphotic  Communication   Communication Communication: No apparent difficulties    Cognition Arousal: Alert Behavior During Therapy: WFL for tasks assessed/performed   PT - Cognitive impairments: No apparent impairments                         Following commands: Intact       Cueing Cueing Techniques: Verbal cues, Tactile cues     General Comments       Exercises     Assessment/Plan    PT Assessment Patient needs continued PT services  PT Problem List Decreased strength;Decreased mobility;Decreased activity tolerance;Decreased balance       PT Treatment Interventions Patient/family education;Therapeutic exercise;Balance training;Functional mobility training;Therapeutic activities;DME instruction;Gait training    PT Goals (Current goals can be found in the Care Plan section)  Acute Rehab PT Goals Patient Stated Goal: return home after rehab PT Goal Formulation: With patient Time For Goal Achievement: 12/11/23 Potential to Achieve Goals: Good    Frequency Min 3X/week     Co-evaluation               AM-PAC PT 6 Clicks Mobility  Outcome Measure Help needed turning from your back to your side while in a flat bed without using bedrails?: A Lot Help needed moving from lying on your back to sitting on the side of a flat bed without using bedrails?: A Lot Help needed moving to and from a bed to a chair (including a wheelchair)?: A Lot Help needed standing up from a chair using your arms (e.g., wheelchair or bedside chair)?: A Little Help needed to walk in hospital room?: A Little Help needed climbing 3-5 steps with  a railing? : A Lot 6 Click Score: 14    End of Session Equipment Utilized During Treatment: Gait belt Activity Tolerance: Patient limited by fatigue Patient left: in chair;with chair alarm set;with call bell/phone within reach Nurse Communication: Mobility status PT Visit Diagnosis: Unsteadiness on feet (R26.81);Other abnormalities of gait and mobility (R26.89);Muscle weakness (generalized) (M62.81)    Time: 8988-8963 PT Time Calculation (min) (ACUTE ONLY): 25 min   Charges:   PT Evaluation $PT Eval Moderate Complexity: 1 Mod PT Treatments $Therapeutic Activity: 23-37 mins PT General Charges $$ ACUTE PT VISIT: 1 Visit        2:34 PM, 11/27/23,  Tove Wideman, SPT

## 2023-11-27 NOTE — Progress Notes (Signed)
 PROGRESS NOTE  Jamie Ewing FMW:[id number] DOB: [birth year] DOA: 11/26/2023 PCP: Sheryle Carwin, MD  Brief History:  83 year old female with a history of myeloma, aortic valve stenosis hypertension, and peripheral neuropathy presenting with generalized weakness x 2 days.  Received her usual daratumumab  +Velcade  +dexamethasone  for myeloma on 11/24/2023.  He stated that she began feeling tired with some generalized weakness on the following day.  She had some dizziness when she gets up from a recumbent position.  She denies any syncope.  She has had some subjective fevers and chills.  She denies any chest pain, shortness breath, coughing, hemoptysis, nausea, vomiting.  She has had loose stools for the better part of a month.  There is no hematochezia or melena.  She states that she takes loperamide on a daily basis because of her loose stool.  She has some lower abdominal pain and cramping.  She denies any frank dysuria or hematuria.  At baseline, the patient uses a rollator, and she is able to get up out of bed on her own.  She denies any other new medications.  Appetite has been poor with some nausea since her last chemotherapy. In the ED, the patient was afebrile hemodynamically stable with oxygen saturation 100% room air.  WBC 7.8, hemoglobin 13.0, platelet 57,000.  Sodium 128, potassium 3.9, bicarbonate 19, serum creatinine 1.00.  Magnesium  2.5.  Lactic acid 3.6>> 2.4.  CT abdomen pelvis was negative for any acute findings.  There were innumerable lytic lesions in her visible skeleton compatible with her history of myeloma.  COVID-19 PCR is negative.  The patient was given 1.5 L of fluid and started on ceftriaxone .    Stage II IgA kappa multiple myeloma  s/p several local XRT's, induction with RVD followed by bone marrow transplant in 2020.   currently on daratumumab  +Velcade  +dexamethasone     Assessment/Plan: Generalized weakness/dehydration -Continue IV fluids - TSH--1.197 - B12--1877 -  Folate--10.6 - PT eval--SNF - CPK-- -9/7 UA--no pyuria - COVID/RSV/Flu--neg   Diarrhea - Stool for C. Difficile ordered - Stool pathogen panel ordered - no BM since admission - 9/7 CT AP--no acute findings   Essential hypertension - Holding metoprolol  secondary to soft blood pressure - Holding HCTZ and amlodipine  secondary to soft blood pressure - BP remains well controlled   Stage II IgA kappa multiple myeloma  -s/p several local XRT's, induction with RVD followed by -bone marrow transplant in 2020.   currently on daratumumab  +Velcade  +dexamethasone     Thrush -mycelex  troches            Family Communication:   no Family at bedside  Consultants:  none  Code Status:  FULL   DVT Prophylaxis:  SCDs   Procedures: As Listed in Progress Note Above  Antibiotics: None     Subjective: Patient denies fevers, chills, headache, chest pain, dyspnea, nausea, vomiting, diarrhea, abdominal pain, dysuria, hematuria, hematochezia, and melena.   Objective: Vitals:   11/27/23 0119 11/27/23 0556 11/27/23 0627 11/27/23 1219  BP: (!) 96/54 (!) 92/50 (!) 90/52 118/61  Pulse: 74 77  80  Resp: 20 18    Temp: 98.7 F (37.1 C) 99.1 F (37.3 C)  98.6 F (37 C)  TempSrc: Oral Oral  Oral  SpO2: 97% 98%  100%  Weight:      Height:        Intake/Output Summary (Last 24 hours) at 11/27/2023 1541 Last data filed at 11/27/2023 0300 Gross per 24  hour  Intake 355.67 ml  Output --  Net 355.67 ml   Weight change:  Exam:  General:  Pt is alert, follows commands appropriately, not in acute distress HEENT: No icterus, No thrush, No neck mass, [place]/AT Cardiovascular: RRR, S1/S2, no rubs, no gallops Respiratory: bibasilar crackles. No wheeze Abdomen: Soft/+BS, non tender, non distended, no guarding Extremities: No edema, No lymphangitis, No petechiae, No rashes, no synovitis   Data Reviewed: I have personally reviewed following labs and imaging studies Basic Metabolic  Panel: Recent Labs  Lab 11/21/23 1145 11/26/23 1036 11/27/23 0406  NA 129* 128* 132*  K 3.6 3.9 3.9  CL 93* 93* 103  CO2 23 19* 23  GLUCOSE 119* 145* 110*  BUN 23 26* 20  CREATININE 0.79 1.00 0.70  CALCIUM 9.1 9.3 8.2*  MG  --  2.5*  --    Liver Function Tests: Recent Labs  Lab 11/21/23 1145 11/27/23 0406  AST 22 16  ALT 29 20  ALKPHOS 68 51  BILITOT 0.8 0.4  PROT 6.3* 5.1*  ALBUMIN  3.1* 2.6*   No results for input(s): LIPASE, AMYLASE in the last 168 hours. No results for input(s): AMMONIA in the last 168 hours. Coagulation Profile: No results for input(s): INR, PROTIME in the last 168 hours. CBC: Recent Labs  Lab 11/21/23 1145 11/26/23 1036 11/27/23 0406  WBC 5.6 7.8 4.4  NEUTROABS 4.4 5.1  --   HGB 12.4 13.0 10.7*  HCT 35.5* 38.9 31.3*  MCV 95.4 97.3 96.6  PLT 61* 57* 48*   Cardiac Enzymes: No results for input(s): CKTOTAL, CKMB, CKMBINDEX, TROPONINI in the last 168 hours. BNP: Invalid input(s): POCBNP CBG: No results for input(s): GLUCAP in the last 168 hours. HbA1C: No results for input(s): HGBA1C in the last 72 hours. Urine analysis:    Component Value Date/Time   COLORURINE YELLOW 11/26/2023 1420   APPEARANCEUR CLEAR 11/26/2023 1420   LABSPEC 1.023 11/26/2023 1420   PHURINE 6.0 11/26/2023 1420   GLUCOSEU NEGATIVE 11/26/2023 1420   HGBUR SMALL (A) 11/26/2023 1420   BILIRUBINUR NEGATIVE 11/26/2023 1420   KETONESUR NEGATIVE 11/26/2023 1420   PROTEINUR NEGATIVE 11/26/2023 1420   NITRITE NEGATIVE 11/26/2023 1420   LEUKOCYTESUR NEGATIVE 11/26/2023 1420   Sepsis Labs: @LABRCNTIP (procalcitonin:4,lacticidven:4) ) Recent Results (from the past 240 hours)  Resp panel by RT-PCR (RSV, Flu A&B, Covid) Anterior Nasal Swab     Status: None   Collection Time: 11/26/23 10:47 AM   Specimen: Anterior Nasal Swab  Result Value Ref Range Status   SARS Coronavirus 2 by RT PCR NEGATIVE NEGATIVE Final    Comment: (NOTE) SARS-CoV-2  target nucleic acids are NOT DETECTED.  The SARS-CoV-2 RNA is generally detectable in upper respiratory specimens during the acute phase of infection. The lowest concentration of SARS-CoV-2 viral copies this assay can detect is 138 copies/mL. A negative result does not preclude SARS-Cov-2 infection and should not be used as the sole basis for treatment or other patient management decisions. A negative result may occur with  improper specimen collection/handling, submission of specimen other than nasopharyngeal swab, presence of viral mutation(s) within the areas targeted by this assay, and inadequate number of viral copies(<138 copies/mL). A negative result must be combined with clinical observations, patient history, and epidemiological information. The expected result is Negative.  Fact Sheet for Patients:  BloggerCourse.com  Fact Sheet for Healthcare Providers:  SeriousBroker.it  This test is no t yet approved or cleared by the [place]  FDA and  has been authorized for  detection and/or diagnosis of SARS-CoV-2 by FDA under an Emergency Use Authorization (EUA). This EUA will remain  in effect (meaning this test can be used) for the duration of the COVID-19 declaration under Section 564(b)(1) of the Act, 21 U.S.C.section 360bbb-3(b)(1), unless the authorization is terminated  or revoked sooner.       Influenza A by PCR NEGATIVE NEGATIVE Final   Influenza B by PCR NEGATIVE NEGATIVE Final    Comment: (NOTE) The Xpert Xpress SARS-CoV-2/FLU/RSV plus assay is intended as an aid in the diagnosis of influenza from Nasopharyngeal swab specimens and should not be used as a sole basis for treatment. Nasal washings and aspirates are unacceptable for Xpert Xpress SARS-CoV-2/FLU/RSV testing.  Fact Sheet for Patients: BloggerCourse.com  Fact Sheet for Healthcare  Providers: SeriousBroker.it  This test is not yet approved or cleared by the [place]  FDA and has been authorized for detection and/or diagnosis of SARS-CoV-2 by FDA under an Emergency Use Authorization (EUA). This EUA will remain in effect (meaning this test can be used) for the duration of the COVID-19 declaration under Section 564(b)(1) of the Act, 21 U.S.C. section 360bbb-3(b)(1), unless the authorization is terminated or revoked.     Resp Syncytial Virus by PCR NEGATIVE NEGATIVE Final    Comment: (NOTE) Fact Sheet for Patients: BloggerCourse.com  Fact Sheet for Healthcare Providers: SeriousBroker.it  This test is not yet approved or cleared by the [place]  FDA and has been authorized for detection and/or diagnosis of SARS-CoV-2 by FDA under an Emergency Use Authorization (EUA). This EUA will remain in effect (meaning this test can be used) for the duration of the COVID-19 declaration under Section 564(b)(1) of the Act, 21 U.S.C. section 360bbb-3(b)(1), unless the authorization is terminated or revoked.  Performed at [facility], [address] [place], [place] [address]   Culture, blood (routine x 2)     Status: None (Preliminary result)   Collection Time: 11/26/23 12:55 PM   Specimen: BLOOD  Result Value Ref Range Status   Specimen Description BLOOD BLOOD LEFT HAND  Final   Special Requests AEROBIC BOTTLE ONLY Blood Culture adequate volume  Final   Culture   Final    NO GROWTH < 24 HOURS Performed at [facility], [address], [place], [place] [address]    Report Status PENDING  Incomplete  Culture, blood (routine x 2)     Status: None (Preliminary result)   Collection Time: 11/26/23 12:55 PM   Specimen: BLOOD  Result Value Ref Range Status   Specimen Description BLOOD RIGHT ANTECUBITAL  Final   Special Requests   Final    BOTTLES DRAWN AEROBIC AND ANAEROBIC Blood Culture  adequate volume   Culture   Final    NO GROWTH < 24 HOURS Performed at [facility], [address] [place], [place] [address]    Report Status PENDING  Incomplete     Scheduled Meds:  clotrimazole   10 mg Oral 5 X Daily   Continuous Infusions:  0.9 % NaCl with KCl 20 mEq / L 75 mL/hr at 11/26/23 2215    Procedures/Studies: CT ABDOMEN PELVIS W CONTRAST Result Date: 11/26/2023 CLINICAL DATA:  Diffuse abdominal pain. EXAM: CT ABDOMEN AND PELVIS WITH CONTRAST TECHNIQUE: Multidetector CT imaging of the abdomen and pelvis was performed using the standard protocol following bolus administration of intravenous contrast. RADIATION DOSE REDUCTION: This exam was performed according to the departmental dose-optimization program which includes automated exposure control, adjustment of the mA and/or kV according to patient size  and/or use of iterative reconstruction technique. CONTRAST:  OMNIPAQUE  IOHEXOL  300 MG/ML  SOLN COMPARISON:  August 24, 2023. FINDINGS: Lower chest: No acute abnormality. Hepatobiliary: No focal liver abnormality is seen. No gallstones, gallbladder wall thickening, or biliary dilatation. Pancreas: Unremarkable. No pancreatic ductal dilatation or surrounding inflammatory changes. Spleen: Normal in size without focal abnormality. Adrenals/Urinary Tract: Adrenal glands appear normal. Right renal cysts are noted. No hydronephrosis or renal obstruction is noted. Urinary bladder is unremarkable. Stomach/Bowel: Stomach is unremarkable. There is no evidence of bowel obstruction or inflammation. The appendix is not clearly visualized. Vascular/Lymphatic: Aortic atherosclerosis. No enlarged abdominal or pelvic lymph nodes. Reproductive: Uterus and bilateral adnexa are unremarkable. Other: No pneumothorax or pleural effusion is noted. Musculoskeletal: Innumerable lytic lesions are noted throughout the visualized skeleton and spine consistent with history of multiple myeloma. IMPRESSION: 1.  Innumerable lytic lesions are noted throughout the visualized skeleton and spine consistent with history of multiple myeloma. 2. No acute abnormality seen in the abdomen or pelvis. 3. Aortic atherosclerosis. Aortic Atherosclerosis (ICD10-I70.0). Electronically Signed   By: Lynwood Landy Raddle M.D.   On: 11/26/2023 13:21   DG Chest Port 1 View Result Date: 11/26/2023 CLINICAL DATA:  Weakness.  Multiple myeloma. EXAM: PORTABLE CHEST 1 VIEW COMPARISON:  11/08/2023 FINDINGS: The heart size and mediastinal contours are within normal limits. Both lungs are clear. Sclerotic bone lesion again seen involving the left posterior 4th rib. Lower thoracic spine fusion hardware again noted. IMPRESSION: No active cardiopulmonary disease. Stable sclerotic bone lesion involving the left posterior 4th rib. Electronically Signed   By: Norleen DELENA Kil M.D.   On: 11/26/2023 11:33   DG Chest Port 1 View Result Date: 11/08/2023 CLINICAL DATA:  Thrombocytopenia. Undergoing chemotherapy and radiation therapy. EXAM: PORTABLE CHEST 1 VIEW COMPARISON:  08/14/2022 and CT chest 01/11/2007. FINDINGS: Trachea is midline. Heart size normal. Lungs are clear. No pleural fluid. Lucent lesion in the lateral left clavicle is again noted. Lower thoracic spinal hardware. IMPRESSION: 1. No acute findings. 2. Lytic lesion in the lateral left clavicle, compatible with multiple myeloma. Electronically Signed   By: Newell Eke M.D.   On: 11/08/2023 17:56    Alm Schneider, DO  [facility]  If 7PM-7AM, please contact night-coverage [email or web address] Password [email or web address] 11/27/2023, 3:41 PM   LOS: 0 days

## 2023-11-27 NOTE — Progress Notes (Signed)
   11/27/23 0556  Vitals  Temp 99.1 F (37.3 C)  Temp Source Oral  BP (!) 92/50  MAP (mmHg) (!) 64  BP Location Right Arm  BP Method Automatic  Patient Position (if appropriate) Lying  Pulse Rate 77  Pulse Rate Source Monitor  Resp 18  MEWS COLOR  MEWS Score Color Green  Oxygen Therapy  SpO2 98 %  O2 Device Room Air  MEWS Score  MEWS Temp 0  MEWS Systolic 1  MEWS Pulse 0  MEWS RR 0  MEWS LOC 0  MEWS Score 1  Provider Notification  Provider Name/Title Adefeso DO  Date Provider Notified 11/27/23  Time Provider Notified 0612  Method of Notification  (Secure chat)  Notification Reason Other (Comment) (Low grade temp, hypotensive.)  Provider response Other (Comment) (Check BP manual)  Date of Provider Response 11/27/23  Time of Provider Response [phone]     Patient has a low grade temp, hypotensive. Adefeso DO made aware. Obtained manual BP 90/52. Received order for NS 500 mL bolus.

## 2023-11-27 NOTE — TOC Initial Note (Addendum)
 Transition of Care [facility]) - Initial/Assessment Note    Patient Details  Name: Jamie Ewing MRN: [id number] Date of Birth: [birth year]  Transition of Care [facility]) CM/SW Contact:    Hoy DELENA Bigness, LCSW Phone Number: 11/27/2023, 11:58 AM  Clinical Narrative:                 Pt from home with daughter. Pt admitted for generalized weakness. Pt recommended for SNF and is agreeable to this reocmmendatin. Pt reports preference for placement at [facility].  Referrals have been faxed out and currently awaiting bed offers.  Auth started.   Expected Discharge Plan: Skilled Nursing Facility Barriers to Discharge: Continued Medical Work up, SNF Pending bed offer   Patient Goals and CMS Choice Patient states their goals for this hospitalization and ongoing recovery are:: To go to STR prior to returning home CMS Medicare.gov Compare Post Acute Care list provided to:: Patient Choice offered to / list presented to : Patient  ownership interest in [facility].provided to:: Patient    Expected Discharge Plan and Services In-house Referral: Clinical Social Work Discharge Planning Services: NA Post Acute Care Choice: Skilled Nursing Facility Living arrangements for the past 2 months: Apartment                 DME Arranged: N/A DME Agency: NA                  Prior Living Arrangements/Services Living arrangements for the past 2 months: Apartment Lives with:: Self Patient language and need for interpreter reviewed:: Yes Do you feel safe going back to the place where you live?: Yes      Need for Family Participation in Patient Care: No (Comment) Care giver support system in place?: No (comment) Current home services: DME Criminal Activity/Legal Involvement Pertinent to Current Situation/Hospitalization: No - Comment as needed  Activities of Daily Living   ADL Screening (condition at time of admission) Independently performs ADLs?: No Does the patient have a NEW difficulty  with bathing/dressing/toileting/self-feeding that is expected to last >3 days?: No Does the patient have a NEW difficulty with getting in/out of bed, walking, or climbing stairs that is expected to last >3 days?: Yes (Initiates electronic notice to provider for possible PT consult) Does the patient have a NEW difficulty with communication that is expected to last >3 days?: No Is the patient deaf or have difficulty hearing?: No Does the patient have difficulty seeing, even when wearing glasses/contacts?: No Does the patient have difficulty concentrating, remembering, or making decisions?: No  Permission Sought/Granted Permission sought to share information with : Facility Medical sales representative, Family Supports Permission granted to share information with : Yes, Verbal Permission Granted  Share Information with NAME: Macaila, Tahir (Daughter)  [phone]  Permission granted to share info w AGENCY: SNF's        Emotional Assessment Appearance:: Appears stated age Attitude/Demeanor/Rapport: Engaged Affect (typically observed): Accepting Orientation: : Oriented to Self, Oriented to Place, Oriented to Situation, Oriented to  Time Alcohol / Substance Use: Not Applicable Psych Involvement: No (comment)  Admission diagnosis:  Dehydration [E86.0] Lactic acidosis [E87.20] Weakness [R53.1] Generalized weakness [R53.1] Multiple myeloma, remission status unspecified (HCC) [C90.00] Patient Active Problem List   Diagnosis Date Noted   Generalized weakness 11/26/2023   Thrombocytopenia (HCC) 11/26/2023   Pedal edema 11/13/2023   Pancytopenia (HCC) 11/13/2023   Metastatic multiple myeloma to bone (HCC) 11/12/2023   Peripheral neuropathy 11/12/2023   Moderate aortic stenosis 06/26/2023   Severe left ventricular  hypertrophy 06/26/2023   HTN (hypertension) 06/26/2023   Preoperative cardiovascular examination 10/19/2017   Multiple myeloma (HCC) 10/10/2017   Status post surgery 10/06/2017    Thoracic spine tumor 10/06/2017   Spinal stenosis of thoracic region 10/06/2017   Thoracic stenosis 10/06/2017   Lipoma of axilla 05/31/2011   PCP:  Sheryle Carwin, MD Pharmacy:   [facility] [phone] - [place], [place] - [address] AT [facility] [address] [place] [address] Phone: [phone] Fax: [phone]  [facility] - [place], [place] - [address]  [place] [address] Phone: [phone] Fax: [phone]  [facility] - [place], [place] - [address] [place] [address] Phone: [phone] Fax: [phone]  [facility] - [place], [place] - [address] [place] [address] Phone: [phone] Fax: [phone]     Social Drivers of Health (SDOH) Social History: SDOH Screenings   Food Insecurity: No Food Insecurity (11/26/2023)  Housing: Low Risk  (11/26/2023)  Transportation Needs: No Transportation Needs (11/26/2023)  Utilities: Not At Risk (11/26/2023)  Depression (PHQ2-9): Low Risk  (11/24/2023)  Financial Resource Strain: Low Risk  (10/19/2017)  Physical Activity: Sufficiently Active (10/19/2017)  Social Connections: Socially Isolated (11/26/2023)  Stress: No Stress Concern Present (10/19/2017)  Tobacco Use: Medium Risk (11/26/2023)   SDOH Interventions: Social Connections Interventions: Inpatient TOC, Intervention Not Indicated   Readmission Risk Interventions     No data to display

## 2023-11-27 NOTE — Plan of Care (Addendum)
  Problem: Acute Rehab PT Goals(only PT should resolve) Goal: Pt Will Go Supine/Side To Sit Outcome: Progressing Flowsheets (Taken 11/27/2023 1435) Pt will go Supine/Side to Sit: with contact guard assist Goal: Patient Will Transfer Sit To/From Stand Outcome: Progressing Flowsheets (Taken 11/27/2023 1435) Patient will transfer sit to/from stand: with minimal assist Goal: Pt Will Transfer Bed To Chair/Chair To Bed Outcome: Progressing Flowsheets (Taken 11/27/2023 1435) Pt will Transfer Bed to Chair/Chair to Bed: with contact guard assist Goal: Pt Will Ambulate Outcome: Progressing Flowsheets (Taken 11/27/2023 1435) Pt will Ambulate:  15 feet  25 feet  with rolling walker  with contact guard assist     This licensed practitioner was present in the room guiding the student in service delivery. Therapy student was participating in the provision of services, and the practitioner was not engaged in treating another patient or doing other tasks at the same time.  3:02 PM, 11/27/23 Lynwood Music, MPT Physical Therapist with [phone] office [phone] mobile phone

## 2023-11-27 NOTE — Plan of Care (Signed)

## 2023-11-27 NOTE — [place] FL2 (Signed)
 [place]  MEDICAID FL2 LEVEL OF CARE FORM     IDENTIFICATION  Patient Name: Jamie Ewing Birthdate: May 13, 1940 Sex: female Admission Date (Current Location): 11/26/2023  [facility] and [place] Number:  [employer] and Address:  [facility],  [address] [place] [address]      Provider Number: [id number]  Attending Physician Name and Address:  Evonnie Lenis, MD  Relative Name and Phone Number:  Elfie, Costanza (Daughter)  [phone]    Current Level of Care: Hospital Recommended Level of Care: Skilled Nursing Facility Prior Approval Number:    Date Approved/Denied:   PASRR Number: [id number]  Discharge Plan: SNF    Current Diagnoses: Patient Active Problem List   Diagnosis Date Noted   Generalized weakness 11/26/2023   Thrombocytopenia (HCC) 11/26/2023   Pedal edema 11/13/2023   Pancytopenia (HCC) 11/13/2023   Metastatic multiple myeloma to bone (HCC) 11/12/2023   Peripheral neuropathy 11/12/2023   Moderate aortic stenosis 06/26/2023   Severe left ventricular hypertrophy 06/26/2023   HTN (hypertension) 06/26/2023   Preoperative cardiovascular examination 10/19/2017   Multiple myeloma (HCC) 10/10/2017   Status post surgery 10/06/2017   Thoracic spine tumor 10/06/2017   Spinal stenosis of thoracic region 10/06/2017   Thoracic stenosis 10/06/2017   Lipoma of axilla 05/31/2011    Orientation RESPIRATION BLADDER Height & Weight     Self, Time, Situation, Place  Normal Continent Weight: 148 lb 5.9 oz (67.3 kg) Height:  5' 3 (160 cm)  BEHAVIORAL SYMPTOMS/MOOD NEUROLOGICAL BOWEL NUTRITION STATUS      Continent Diet (regular)  AMBULATORY STATUS COMMUNICATION OF NEEDS Skin   Limited Assist Verbally Normal                       Personal Care Assistance Level of Assistance  Bathing, Feeding, Dressing Bathing Assistance: Limited assistance Feeding assistance: Independent Dressing Assistance: Limited assistance     Functional  Limitations Info  Sight, Hearing, Speech Sight Info: Impaired Financial trader) Hearing Info: Impaired Speech Info: Adequate    SPECIAL CARE FACTORS FREQUENCY  PT (By licensed PT), OT (By licensed OT)     PT Frequency: 5x/wk OT Frequency: 5x/wk            Contractures Contractures Info: Not present    Additional Factors Info  Code Status, Allergies, Isolation Precautions Code Status Info: FULL Allergies Info: No Known Allergies     Isolation Precautions Info: Enteric precautions     Current Medications (11/27/2023):  This is the current hospital active medication list Current Facility-Administered Medications  Medication Dose Route Frequency Provider Last Rate Last Admin   0.9 % NaCl with KCl 20 mEq/ L  infusion   Intravenous Continuous Tat, David, MD 75 mL/hr at 11/26/23 2215 New Bag at 11/26/23 2215   acetaminophen  (TYLENOL ) tablet 650 mg  650 mg Oral Q6H PRN Tat, Lenis, MD       Or   acetaminophen  (TYLENOL ) suppository 650 mg  650 mg Rectal Q6H PRN Tat, Lenis, MD       clotrimazole  (MYCELEX ) troche 10 mg  10 mg Oral 5 X Daily Tat, David, MD   10 mg at 11/27/23 0800   ondansetron  (ZOFRAN ) tablet 4 mg  4 mg Oral Q6H PRN Tat, David, MD       Or   ondansetron  (ZOFRAN ) injection 4 mg  4 mg Intravenous Q6H PRN Evonnie Lenis, MD         Discharge Medications: Please see discharge summary for a list  of discharge medications.  Relevant Imaging Results:  Relevant Lab Results:   Additional Information SSN: [id number] [phone]  Hoy DELENA Bigness, LCSW

## 2023-11-27 NOTE — Progress Notes (Signed)
 Mobility Specialist Progress Note:    11/27/23 1535  Mobility  Activity Pivoted/transferred to/from [facility]  Level of Assistance Contact guard assist, steadying assist  Assistive Device  (1 HHA)  Distance Ambulated (ft) 3 ft  Range of Motion/Exercises Active;All extremities  Activity Response Tolerated well  Mobility Referral Yes  Mobility visit 1 Mobility  Mobility Specialist Start Time (ACUTE ONLY) 1535  Mobility Specialist Stop Time (ACUTE ONLY) 1555  Mobility Specialist Time Calculation (min) (ACUTE ONLY) 20 min   Pt received in chair, requesting assistance to [facility]. Required CGA with 1 hand-held assist to stand and transfer. Tolerated well,asx throughout. NT notified, all needs met.  Klyn Kroening Mobility Specialist Please contact via Special educational needs teacher or  Rehab office at [phone]

## 2023-11-28 ENCOUNTER — Inpatient Hospital Stay

## 2023-11-28 DIAGNOSIS — I10 Essential (primary) hypertension: Secondary | ICD-10-CM | POA: Diagnosis not present

## 2023-11-28 DIAGNOSIS — R2689 Other abnormalities of gait and mobility: Secondary | ICD-10-CM | POA: Diagnosis not present

## 2023-11-28 DIAGNOSIS — E860 Dehydration: Secondary | ICD-10-CM | POA: Diagnosis not present

## 2023-11-28 DIAGNOSIS — B370 Candidal stomatitis: Secondary | ICD-10-CM | POA: Diagnosis not present

## 2023-11-28 DIAGNOSIS — E871 Hypo-osmolality and hyponatremia: Secondary | ICD-10-CM | POA: Diagnosis not present

## 2023-11-28 DIAGNOSIS — M6289 Other specified disorders of muscle: Secondary | ICD-10-CM | POA: Diagnosis not present

## 2023-11-28 DIAGNOSIS — C9000 Multiple myeloma not having achieved remission: Secondary | ICD-10-CM | POA: Diagnosis not present

## 2023-11-28 DIAGNOSIS — M4804 Spinal stenosis, thoracic region: Secondary | ICD-10-CM | POA: Diagnosis not present

## 2023-11-28 DIAGNOSIS — E441 Mild protein-calorie malnutrition: Secondary | ICD-10-CM | POA: Diagnosis not present

## 2023-11-28 DIAGNOSIS — R509 Fever, unspecified: Secondary | ICD-10-CM | POA: Diagnosis not present

## 2023-11-28 DIAGNOSIS — E7800 Pure hypercholesterolemia, unspecified: Secondary | ICD-10-CM | POA: Diagnosis not present

## 2023-11-28 DIAGNOSIS — R5381 Other malaise: Secondary | ICD-10-CM | POA: Diagnosis not present

## 2023-11-28 DIAGNOSIS — I350 Nonrheumatic aortic (valve) stenosis: Secondary | ICD-10-CM | POA: Diagnosis not present

## 2023-11-28 DIAGNOSIS — R197 Diarrhea, unspecified: Secondary | ICD-10-CM | POA: Diagnosis not present

## 2023-11-28 DIAGNOSIS — M549 Dorsalgia, unspecified: Secondary | ICD-10-CM | POA: Diagnosis not present

## 2023-11-28 DIAGNOSIS — G629 Polyneuropathy, unspecified: Secondary | ICD-10-CM | POA: Diagnosis not present

## 2023-11-28 DIAGNOSIS — R531 Weakness: Secondary | ICD-10-CM | POA: Diagnosis not present

## 2023-11-28 DIAGNOSIS — D696 Thrombocytopenia, unspecified: Secondary | ICD-10-CM | POA: Diagnosis not present

## 2023-11-28 DIAGNOSIS — Z87891 Personal history of nicotine dependence: Secondary | ICD-10-CM | POA: Diagnosis not present

## 2023-11-28 DIAGNOSIS — M6281 Muscle weakness (generalized): Secondary | ICD-10-CM | POA: Diagnosis not present

## 2023-11-28 DIAGNOSIS — Z96652 Presence of left artificial knee joint: Secondary | ICD-10-CM | POA: Diagnosis not present

## 2023-11-28 LAB — BASIC METABOLIC PANEL WITH GFR
Anion gap: 7 (ref 5–15)
BUN: 17 mg/dL (ref 8–23)
CO2: 24 mmol/L (ref 22–32)
Calcium: 8.1 mg/dL — ABNORMAL LOW (ref 8.9–10.3)
Chloride: 101 mmol/L (ref 98–111)
Creatinine, Ser: 0.6 mg/dL (ref 0.44–1.00)
GFR, Estimated: >60 mL/min (ref >60)
Glucose, Bld: 124 mg/dL — ABNORMAL HIGH (ref 70–99)
Potassium: 3.5 mmol/L (ref 3.5–5.1)
Sodium: 132 mmol/L — ABNORMAL LOW (ref 135–145)

## 2023-11-28 LAB — CBC
HCT: 31.7 % — ABNORMAL LOW (ref 36.0–46.0)
Hemoglobin: 10.8 g/dL — ABNORMAL LOW (ref 12.0–15.0)
MCH: 33.5 pg (ref 26.0–34.0)
MCHC: 34.1 g/dL (ref 30.0–36.0)
MCV: 98.4 fL (ref 80.0–100.0)
Platelets: 44 K/uL — ABNORMAL LOW (ref 150–400)
RBC: 3.22 MIL/uL — ABNORMAL LOW (ref 3.87–5.11)
RDW: 17 % — ABNORMAL HIGH (ref 11.5–15.5)
WBC: 4.5 K/uL (ref 4.0–10.5)
nRBC: 1.5 % — ABNORMAL HIGH (ref 0.0–0.2)

## 2023-11-28 LAB — CK: Total CK: 28 U/L — ABNORMAL LOW (ref 38–234)

## 2023-11-28 LAB — MAGNESIUM: Magnesium: 2.2 mg/dL (ref 1.7–2.4)

## 2023-11-28 MED ORDER — ENSURE PLUS HIGH PROTEIN PO LIQD: 237.0000 mL | Freq: Two times a day (BID) | ORAL | Status: AC

## 2023-11-28 MED ORDER — CLOTRIMAZOLE 10 MG MT TROC: 10.0000 mg | Freq: Every day | OROMUCOSAL | Status: DC

## 2023-11-28 MED ORDER — ENSURE PLUS HIGH PROTEIN PO LIQD
237.0000 mL | Freq: Two times a day (BID) | ORAL | Status: DC
  Administered 2023-11-28 (×2): 237 mL via ORAL

## 2023-11-28 MED ORDER — POTASSIUM CHLORIDE CRYS ER 20 MEQ PO TBCR
20.0000 meq | EXTENDED_RELEASE_TABLET | Freq: Once | ORAL | Status: AC
  Administered 2023-11-28: 20 meq via ORAL
  Filled 2023-11-28: qty 1

## 2023-11-28 NOTE — Progress Notes (Signed)
 Report called to [facility] LPN at [facility], [phone]. Room 104. Daughter providing transportation.

## 2023-11-28 NOTE — TOC Progression Note (Signed)
 Transition of Care [facility]) - Progression Note    Patient Details  Name: Jamie Ewing MRN: [id number] Date of Birth: March 15, 1941  Transition [facility]) CM/SW Contact  [place] Saddie Kim, [place] Phone Number: 11/28/2023, 11:08 AM  Clinical Narrative: LCSW discussed bed offers with pt who defers to her daughter. Pt's daughter chooses [address] Facility notified. Auth updated- currently pending.       Expected Discharge Plan: Skilled Nursing Facility Barriers to Discharge: Continued Medical Work up, SNF Pending bed offer               Expected Discharge Plan and Services In-house Referral: Clinical Social Work Discharge Planning Services: NA Post Acute Care Choice: Skilled Nursing Facility Living arrangements for the past 2 months: Apartment                 DME Arranged: N/A DME Agency: NA                   Social Drivers of Health (SDOH) Interventions SDOH Screenings   Food Insecurity: No Food Insecurity (11/26/2023)  Housing: Low Risk  (11/26/2023)  Transportation Needs: No Transportation Needs (11/26/2023)  Utilities: Not At Risk (11/26/2023)  Depression (PHQ2-9): Low Risk  (11/24/2023)  Financial Resource Strain: Low Risk  (10/19/2017)  Physical Activity: Sufficiently Active (10/19/2017)  Social Connections: Socially Isolated (11/26/2023)  Stress: No Stress Concern Present (10/19/2017)  Tobacco Use: Medium Risk (11/26/2023)    Readmission Risk Interventions     No data to display

## 2023-11-28 NOTE — Plan of Care (Signed)

## 2023-11-28 NOTE — Discharge Summary (Signed)
 Physician Discharge Summary   Patient: Jamie Ewing MRN: [id number] DOB: 18-Sep-1940  Admit date:     11/26/2023  Discharge date: 11/28/23  Discharge Physician: Alm Siyona Coto   PCP: Sheryle Carwin, MD   Recommendations at discharge:   Please follow up with primary care provider within 1-2 weeks  Please repeat BMP and CBC in one week    Hospital Course: 83 year old female with a history of myeloma, aortic valve stenosis hypertension, and peripheral neuropathy presenting with generalized weakness x 2 days.  Received her usual daratumumab  +Velcade  +dexamethasone  for myeloma on 11/24/2023.  He stated that she began feeling tired with some generalized weakness on the following day.  She had some dizziness when she gets up from a recumbent position.  She denies any syncope.  She has had some subjective fevers and chills.  She denies any chest pain, shortness breath, coughing, hemoptysis, nausea, vomiting.  She has had loose stools for the better part of a month.  There is no hematochezia or melena.  She states that she takes loperamide on a daily basis because of her loose stool.  She has some lower abdominal pain and cramping.  She denies any frank dysuria or hematuria.  At baseline, the patient uses a rollator, and she is able to get up out of bed on her own.  She denies any other new medications.  Appetite has been poor with some nausea since her last chemotherapy. In the ED, the patient was afebrile hemodynamically stable with oxygen saturation 100% room air.  WBC 7.8, hemoglobin 13.0, platelet 57,000.  Sodium 128, potassium 3.9, bicarbonate 19, serum creatinine 1.00.  Magnesium  2.5.  Lactic acid 3.6>> 2.4.  CT abdomen pelvis was negative for any acute findings.  There were innumerable lytic lesions in her visible skeleton compatible with her history of myeloma.  COVID-19 PCR is negative.  The patient was given 1.5 L of fluid and started on ceftriaxone .     Assessment and Plan:     Generalized  weakness/dehydration -Continued IV fluids - TSH--1.197 - B12--1877 - Folate--10.6 - PT eval>>SNF - CPK--28 -9/7 UA--no pyuria - COVID/RSV/Flu--neg - blood cultures neg - am cortisol 8.2   Diarrhea - Stool for C. Difficile ordered - Stool pathogen panel ordered - no BM since admission - 9/7 CT AP--no acute findings   Essential hypertension - Holding metoprolol  secondary to soft blood pressure - Holding HCTZ and amlodipine  secondary to soft blood pressure - BP remains well controlled throughout hospitalization - will not restart amloipine, diltiazem , hydrochlorothiazide , metoprolol    Stage II IgA kappa multiple myeloma  -s/p several local XRT's, induction with RVD followed by -bone marrow transplant in 2020.   currently on daratumumab  +Velcade  +dexamethasone     Thrush -mycelex  troches  Hyponatremia -due to volume depletion and poor solute intake -improving and stable after IVF - d/c HCTZ  Thrombocytopenia -due to myeloma/chemotherapy -stable -no signs of active bleed -repeat CBC one week after dc     Consultants: none Procedures performed: none  Disposition: Skilled nursing facility Diet recommendation:  Regular diet DISCHARGE MEDICATION: Allergies as of 11/28/2023   No Known Allergies      Medication List     STOP taking these medications    amLODipine  5 MG tablet Commonly known as: NORVASC    diltiazem  60 MG 12 hr capsule Commonly known as: CARDIZEM  SR   hydrochlorothiazide  12.5 MG tablet Commonly known as: HYDRODIURIL    loperamide 2 MG tablet Commonly known as: IMODIUM A-D   metoprolol  tartrate 25  MG tablet Commonly known as: LOPRESSOR    potassium chloride  SA 20 MEQ tablet Commonly known as: KLOR-CON  M       TAKE these medications    acetaminophen  500 MG tablet Commonly known as: TYLENOL  Take 1,000 mg by mouth at bedtime.   acyclovir  400 MG tablet Commonly known as: ZOVIRAX  Take 1 tablet (400 mg total) by mouth 2 (two) times  daily.   calcium carbonate 1500 (600 Ca) MG Tabs tablet Commonly known as: OSCAL Take 600 mg of elemental calcium by mouth daily with breakfast.   clotrimazole  10 MG troche Commonly known as: MYCELEX  Take 1 tablet (10 mg total) by mouth 5 (five) times daily. X 5 days   cyanocobalamin  1000 MCG tablet Commonly known as: VITAMIN B12 Take 1,000 mcg by mouth daily.   dexamethasone  4 MG tablet Commonly known as: DECADRON  Take 1 tablet (4 mg total) by mouth once a week. Take 20 mg by mouth weekly the day after darzalex  injection   feeding supplement Liqd Take 237 mLs by mouth 2 (two) times daily between meals.   furosemide  40 MG tablet Commonly known as: Lasix  Take 1 tablet (40 mg total) by mouth daily as needed. What changed: reasons to take this   LOW-DOSE ASPIRIN PO Take 81 mg by mouth at bedtime.   prochlorperazine  10 MG tablet Commonly known as: COMPAZINE  Take 1 tablet (10 mg total) by mouth every 6 (six) hours as needed. What changed: reasons to take this   Thera-M Tabs Take 1 tablet by mouth daily.        Contact information for after-discharge care     Destination     [facility] .   Service: Skilled Nursing Contact information: [address] [facility]  [address] [phone]                    Discharge Exam: Filed Weights   11/26/23 1002 11/26/23 1720  Weight: 67.6 kg 67.3 kg   HEENT:  [place]/AT, No thrush, no icterus CV:  RRR, no rub, no S3, no S4 Lung: right base crackles. No wheeze.  L-CTA Abd:  soft/+BS, NT Ext:  No edema, no lymphangitis, no synovitis, no rash   Condition at discharge: stable  The results of significant diagnostics from this hospitalization (including imaging, microbiology, ancillary and laboratory) are listed below for reference.   Imaging Studies: CT ABDOMEN PELVIS W CONTRAST Result Date: 11/26/2023 CLINICAL DATA:  Diffuse abdominal pain. EXAM: CT ABDOMEN AND PELVIS WITH CONTRAST TECHNIQUE:  Multidetector CT imaging of the abdomen and pelvis was performed using the standard protocol following bolus administration of intravenous contrast. RADIATION DOSE REDUCTION: This exam was performed according to the departmental dose-optimization program which includes automated exposure control, adjustment of the mA and/or kV according to patient size and/or use of iterative reconstruction technique. CONTRAST:  OMNIPAQUE  IOHEXOL  300 MG/ML  SOLN COMPARISON:  August 24, 2023. FINDINGS: Lower chest: No acute abnormality. Hepatobiliary: No focal liver abnormality is seen. No gallstones, gallbladder wall thickening, or biliary dilatation. Pancreas: Unremarkable. No pancreatic ductal dilatation or surrounding inflammatory changes. Spleen: Normal in size without focal abnormality. Adrenals/Urinary Tract: Adrenal glands appear normal. Right renal cysts are noted. No hydronephrosis or renal obstruction is noted. Urinary bladder is unremarkable. Stomach/Bowel: Stomach is unremarkable. There is no evidence of bowel obstruction or inflammation. The appendix is not clearly visualized. Vascular/Lymphatic: Aortic atherosclerosis. No enlarged abdominal or pelvic lymph nodes. Reproductive: Uterus and bilateral adnexa are unremarkable. Other: No pneumothorax or pleural  effusion is noted. Musculoskeletal: Innumerable lytic lesions are noted throughout the visualized skeleton and spine consistent with history of multiple myeloma. IMPRESSION: 1. Innumerable lytic lesions are noted throughout the visualized skeleton and spine consistent with history of multiple myeloma. 2. No acute abnormality seen in the abdomen or pelvis. 3. Aortic atherosclerosis. Aortic Atherosclerosis (ICD10-I70.0). Electronically Signed   By: Lynwood Landy Raddle M.D.   On: 11/26/2023 13:21   DG Chest Port 1 View Result Date: 11/26/2023 CLINICAL DATA:  Weakness.  Multiple myeloma. EXAM: PORTABLE CHEST 1 VIEW COMPARISON:  11/08/2023 FINDINGS: The heart size and  mediastinal contours are within normal limits. Both lungs are clear. Sclerotic bone lesion again seen involving the left posterior 4th rib. Lower thoracic spine fusion hardware again noted. IMPRESSION: No active cardiopulmonary disease. Stable sclerotic bone lesion involving the left posterior 4th rib. Electronically Signed   By: Norleen DELENA Kil M.D.   On: 11/26/2023 11:33   DG Chest Port 1 View Result Date: 11/08/2023 CLINICAL DATA:  Thrombocytopenia. Undergoing chemotherapy and radiation therapy. EXAM: PORTABLE CHEST 1 VIEW COMPARISON:  08/14/2022 and CT chest 01/11/2007. FINDINGS: Trachea is midline. Heart size normal. Lungs are clear. No pleural fluid. Lucent lesion in the lateral left clavicle is again noted. Lower thoracic spinal hardware. IMPRESSION: 1. No acute findings. 2. Lytic lesion in the lateral left clavicle, compatible with multiple myeloma. Electronically Signed   By: Newell Eke M.D.   On: 11/08/2023 17:56    Microbiology: Results for orders placed or performed during the hospital encounter of 11/26/23  Resp panel by RT-PCR (RSV, Flu A&B, Covid) Anterior Nasal Swab     Status: None   Collection Time: 11/26/23 10:47 AM   Specimen: Anterior Nasal Swab  Result Value Ref Range Status   SARS Coronavirus 2 by RT PCR NEGATIVE NEGATIVE Final    Comment: (NOTE) SARS-CoV-2 target nucleic acids are NOT DETECTED.  The SARS-CoV-2 RNA is generally detectable in upper respiratory specimens during the acute phase of infection. The lowest concentration of SARS-CoV-2 viral copies this assay can detect is 138 copies/mL. A negative result does not preclude SARS-Cov-2 infection and should not be used as the sole basis for treatment or other patient management decisions. A negative result may occur with  improper specimen collection/handling, submission of specimen other than nasopharyngeal swab, presence of viral mutation(s) within the areas targeted by this assay, and inadequate number of  viral copies(<138 copies/mL). A negative result must be combined with clinical observations, patient history, and epidemiological information. The expected result is Negative.  Fact Sheet for Patients:  BloggerCourse.com  Fact Sheet for Healthcare Providers:  SeriousBroker.it  This test is no t yet approved or cleared by the [place]  FDA and  has been authorized for detection and/or diagnosis of SARS-CoV-2 by FDA under an Emergency Use Authorization (EUA). This EUA will remain  in effect (meaning this test can be used) for the duration of the COVID-19 declaration under Section 564(b)(1) of the Act, 21 U.S.C.section 360bbb-3(b)(1), unless the authorization is terminated  or revoked sooner.       Influenza A by PCR NEGATIVE NEGATIVE Final   Influenza B by PCR NEGATIVE NEGATIVE Final    Comment: (NOTE) The Xpert Xpress SARS-CoV-2/FLU/RSV plus assay is intended as an aid in the diagnosis of influenza from Nasopharyngeal swab specimens and should not be used as a sole basis for treatment. Nasal washings and aspirates are unacceptable for Xpert Xpress SARS-CoV-2/FLU/RSV testing.  Fact Sheet for Patients: BloggerCourse.com  Fact Sheet for  Healthcare Providers: SeriousBroker.it  This test is not yet approved or cleared by the [place]  FDA and has been authorized for detection and/or diagnosis of SARS-CoV-2 by FDA under an Emergency Use Authorization (EUA). This EUA will remain in effect (meaning this test can be used) for the duration of the COVID-19 declaration under Section 564(b)(1) of the Act, 21 U.S.C. section 360bbb-3(b)(1), unless the authorization is terminated or revoked.     Resp Syncytial Virus by PCR NEGATIVE NEGATIVE Final    Comment: (NOTE) Fact Sheet for Patients: BloggerCourse.com  Fact Sheet for Healthcare  Providers: SeriousBroker.it  This test is not yet approved or cleared by the [place]  FDA and has been authorized for detection and/or diagnosis of SARS-CoV-2 by FDA under an Emergency Use Authorization (EUA). This EUA will remain in effect (meaning this test can be used) for the duration of the COVID-19 declaration under Section 564(b)(1) of the Act, 21 U.S.C. section 360bbb-3(b)(1), unless the authorization is terminated or revoked.  Performed at [facility], [address] [place], [place] [address]   Culture, blood (routine x 2)     Status: None (Preliminary result)   Collection Time: 11/26/23 12:55 PM   Specimen: BLOOD  Result Value Ref Range Status   Specimen Description BLOOD BLOOD LEFT HAND  Final   Special Requests AEROBIC BOTTLE ONLY Blood Culture adequate volume  Final   Culture   Final    NO GROWTH 2 DAYS Performed at [facility], [address] [place], [place] [address]    Report Status PENDING  Incomplete  Culture, blood (routine x 2)     Status: None (Preliminary result)   Collection Time: 11/26/23 12:55 PM   Specimen: BLOOD  Result Value Ref Range Status   Specimen Description BLOOD RIGHT ANTECUBITAL  Final   Special Requests   Final    BOTTLES DRAWN AEROBIC AND ANAEROBIC Blood Culture adequate volume   Culture   Final    NO GROWTH 2 DAYS Performed at [facility], [address], [place], [place] [address]    Report Status PENDING  Incomplete    Labs: CBC: Recent Labs  Lab 11/21/23 1145 11/26/23 1036 11/27/23 0406 11/28/23 0803  WBC 5.6 7.8 4.4 4.5  NEUTROABS 4.4 5.1  --   --   HGB 12.4 13.0 10.7* 10.8*  HCT 35.5* 38.9 31.3* 31.7*  MCV 95.4 97.3 96.6 98.4  PLT 61* 57* 48* 44*   Basic Metabolic Panel: Recent Labs  Lab 11/21/23 1145 11/26/23 1036 11/27/23 0406 11/28/23 0455  NA 129* 128* 132* 132*  K 3.6 3.9 3.9 3.5  CL 93* 93* 103 101  CO2 23 19* 23 24  GLUCOSE 119* 145* 110* 124*  BUN 23 26* 20 17   CREATININE 0.79 1.00 0.70 0.60  CALCIUM 9.1 9.3 8.2* 8.1*  MG  --  2.5*  --  2.2   Liver Function Tests: Recent Labs  Lab 11/21/23 1145 11/27/23 0406  AST 22 16  ALT 29 20  ALKPHOS 68 51  BILITOT 0.8 0.4  PROT 6.3* 5.1*  ALBUMIN  3.1* 2.6*   CBG: No results for input(s): GLUCAP in the last 168 hours.  Discharge time spent: greater than 30 minutes.  Signed: Alm Schneider, MD [facility] 11/28/2023

## 2023-11-28 NOTE — Progress Notes (Signed)
 Physical Therapy Treatment Patient Details Name: Jamie Ewing MRN: [id number] DOB: [birth year] Today's Date: 11/28/2023   History of Present Illness Jamie Ewing is a 83 year old female with a history of myeloma, aortic valve stenosis hypertension, and peripheral neuropathy presenting with generalized weakness x 2 days.  Received her usual daratumumab  +Velcade  +dexamethasone  for myeloma on 11/24/2023.  He stated that she began feeling tired with some generalized weakness on the following day.  She had some dizziness when she gets up from a recumbent position.  She denies any syncope.  She has had some subjective fevers and chills.  She denies any chest pain, shortness breath, coughing, hemoptysis, nausea, vomiting.  She has had loose stools for the better part of a month.  There is no hematochezia or melena.  She states that she takes loperamide on a daily basis because of her loose stool.  She has some lower abdominal pain and cramping.  She denies any frank dysuria or hematuria.  At baseline, the patient uses a rollator, and she is able to get up out of bed on her own.  She denies any other new medications.  Appetite has been poor with some nausea since her last chemotherapy.    PT Comments  Patient agreeable to PT session. Patient was received in recliner at start of session. Patient was able to complete LE exercises in seated position. Encouraged completion of exercises without trunk support to encourage core activation. Patient tolerated all well. HEP print out given. Min assist needed with STS this date due to general weakness. Pt able to slightly progress ambulation distance this date but remains limited due to increased fatigue and transient dizziness. Vitals taken: BP- 145/67, HR-86 bpm. Patient returns to chair, call button within reach, and nursing staff present at end of session. Patient will benefit from continued skilled physical therapy acutely and in recommended venue in order to address  current deficits.       If plan is discharge home, recommend the following: A lot of help with walking and/or transfers;A lot of help with bathing/dressing/bathroom;Assist for transportation;Help with stairs or ramp for entrance   Can travel by private vehicle     No  Equipment Recommendations  None recommended by PT    Recommendations for Other Services       Precautions / Restrictions Precautions Precautions: Fall Recall of Precautions/Restrictions: Intact Restrictions Weight Bearing Restrictions Per Provider Order: No     Mobility  Bed Mobility       General bed mobility comments: Pt received sitting in recliner at start of session    Transfers Overall transfer level: Needs assistance Equipment used: Rolling walker (2 wheels) Transfers: Sit to/from Stand Sit to Stand: Min assist     General transfer comment: Pt cont to required inc time and min A with transfers using RW due to general weakness and fatigue, pt demo labored movement    Ambulation/Gait Ambulation/Gait assistance: Min assist Gait Distance (Feet): 10 Feet Assistive device: Rolling walker (2 wheels) Gait Pattern/deviations: Trunk flexed, Decreased stride length, Decreased step length - right, Decreased step length - left Gait velocity: Decreased     General Gait Details: Ambulation limited to 10 ft in room due to pt fatigue with the above deviations. Pt reporting inc dizziness towards end of trial. BP taken 145/67, HR: 86 bpm with pt seated.   Stairs             Wheelchair Mobility     Tilt Bed    Modified  Rankin (Stroke Patients Only)       Balance Overall balance assessment: Needs assistance Sitting-balance support: Feet supported, No upper extremity supported Sitting balance-Leahy Scale: Fair Sitting balance - Comments: Seated in chair during LE exercises   Standing balance support: Bilateral upper extremity supported, During functional activity, Reliant on assistive device  for balance Standing balance-Leahy Scale: Fair Standing balance comment: using RW        Communication Communication Communication: No apparent difficulties  Cognition Arousal: Alert Behavior During Therapy: WFL for tasks assessed/performed   PT - Cognitive impairments: No apparent impairments       Following commands: Intact      Cueing Cueing Techniques: Verbal cues, Tactile cues, Visual cues  Exercises General Exercises - Lower Extremity Long Arc Quad: AROM, Strengthening, Both, 10 reps, Seated Hip ABduction/ADduction: AROM, Strengthening, Both, 10 reps, Seated Hip Flexion/Marching: AROM, Strengthening, Both, 10 reps, Seated Toe Raises: AROM, Strengthening, Both, 10 reps, Seated Heel Raises: AROM, Strengthening, Both, 10 reps, Seated    General Comments        Pertinent Vitals/Pain Pain Assessment Pain Assessment: No/denies pain    Home Living                          Prior Function            PT Goals (current goals can now be found in the care plan section) Acute Rehab PT Goals Patient Stated Goal: return home after rehab PT Goal Formulation: With patient Time For Goal Achievement: 12/11/23 Potential to Achieve Goals: Good Progress towards PT goals: Progressing toward goals    Frequency    Min 3X/week      PT Plan      Co-evaluation              AM-PAC PT 6 Clicks Mobility   Outcome Measure  Help needed turning from your back to your side while in a flat bed without using bedrails?: A Lot Help needed moving from lying on your back to sitting on the side of a flat bed without using bedrails?: A Lot Help needed moving to and from a bed to a chair (including a wheelchair)?: A Little Help needed standing up from a chair using your arms (e.g., wheelchair or bedside chair)?: A Little Help needed to walk in hospital room?: A Little Help needed climbing 3-5 steps with a railing? : A Lot 6 Click Score: 15    End of Session  Equipment Utilized During Treatment: Gait belt Activity Tolerance: Patient limited by fatigue;Patient tolerated treatment well Patient left: in chair;with chair alarm set;with call bell/phone within reach   PT Visit Diagnosis: Unsteadiness on feet (R26.81);Other abnormalities of gait and mobility (R26.89);Muscle weakness (generalized) (M62.81)     Time: 9099-9082 PT Time Calculation (min) (ACUTE ONLY): 17 min  Charges:    $Therapeutic Exercise: 8-22 mins PT General Charges $$ ACUTE PT VISIT: 1 Visit                     12:07 PM, 11/28/23 Jamie Ewing, PT, DPT Agenda with [facility]

## 2023-11-28 NOTE — TOC Transition Note (Signed)
 Transition of Care [facility]) - Discharge Note   Patient Details  Name: Jamie Ewing MRN: [id number] Date of Birth: 07-07-1940  [facility] CM/SW Contact:  Mcarthur Saddie Kim, LCSW Phone Number: 11/28/2023, 12:53 PM   Clinical Narrative: Pt d/c today to [facility]. Pt's daughter and facility aware and agreeable. Daughter to transport pt. D/C summary sent to SNF. Authorization received. RN given number to call report.       Final next level of care: Skilled Nursing Facility Barriers to Discharge: Barriers Resolved   Patient Goals and CMS Choice Patient states their goals for this hospitalization and ongoing recovery are:: To go to STR prior to returning home CMS Medicare.gov Compare Post Acute Care list provided to:: Patient Choice offered to / list presented to : Patient [place] ownership interest in [facility].provided to:: Patient    Discharge Placement   Existing PASRR number confirmed : 11/28/23          Patient chooses bed at: Other - please specify in the comment section below: [facility]) Patient to be transferred to facility by: daughter Name of family member notified: daughter Patient and family notified of of transfer: 11/28/23  Discharge Plan and Services Additional resources added to the After Visit Summary for   In-house Referral: Clinical Social Work Discharge Planning Services: NA Post Acute Care Choice: Skilled Nursing Facility          DME Arranged: N/A DME Agency: NA                  Social Drivers of Health (SDOH) Interventions SDOH Screenings   Food Insecurity: No Food Insecurity (11/26/2023)  Housing: Low Risk  (11/26/2023)  Transportation Needs: No Transportation Needs (11/26/2023)  Utilities: Not At Risk (11/26/2023)  Depression (PHQ2-9): Low Risk  (11/24/2023)  Financial Resource Strain: Low Risk  (10/19/2017)  Physical Activity: Sufficiently Active (10/19/2017)  Social Connections: Socially Isolated (11/26/2023)   Stress: No Stress Concern Present (10/19/2017)  Tobacco Use: Medium Risk (11/26/2023)     Readmission Risk Interventions     No data to display

## 2023-12-01 DIAGNOSIS — R197 Diarrhea, unspecified: Secondary | ICD-10-CM | POA: Diagnosis not present

## 2023-12-01 DIAGNOSIS — C9000 Multiple myeloma not having achieved remission: Secondary | ICD-10-CM | POA: Diagnosis not present

## 2023-12-01 DIAGNOSIS — E7800 Pure hypercholesterolemia, unspecified: Secondary | ICD-10-CM | POA: Diagnosis not present

## 2023-12-01 DIAGNOSIS — E860 Dehydration: Secondary | ICD-10-CM | POA: Diagnosis not present

## 2023-12-01 DIAGNOSIS — B370 Candidal stomatitis: Secondary | ICD-10-CM | POA: Diagnosis not present

## 2023-12-01 DIAGNOSIS — M549 Dorsalgia, unspecified: Secondary | ICD-10-CM | POA: Diagnosis not present

## 2023-12-01 DIAGNOSIS — I10 Essential (primary) hypertension: Secondary | ICD-10-CM | POA: Diagnosis not present

## 2023-12-01 DIAGNOSIS — R531 Weakness: Secondary | ICD-10-CM | POA: Diagnosis not present

## 2023-12-01 LAB — CULTURE, BLOOD (ROUTINE X 2)
Culture: NO GROWTH
Culture: NO GROWTH
Special Requests: ADEQUATE
Special Requests: ADEQUATE

## 2023-12-04 ENCOUNTER — Inpatient Hospital Stay

## 2023-12-04 ENCOUNTER — Inpatient Hospital Stay: Admitting: Dietician

## 2023-12-04 ENCOUNTER — Inpatient Hospital Stay: Admitting: Oncology

## 2023-12-04 DIAGNOSIS — I10 Essential (primary) hypertension: Secondary | ICD-10-CM | POA: Diagnosis not present

## 2023-12-04 DIAGNOSIS — R197 Diarrhea, unspecified: Secondary | ICD-10-CM | POA: Diagnosis not present

## 2023-12-04 DIAGNOSIS — R531 Weakness: Secondary | ICD-10-CM | POA: Diagnosis not present

## 2023-12-04 DIAGNOSIS — R5381 Other malaise: Secondary | ICD-10-CM | POA: Diagnosis not present

## 2023-12-04 DIAGNOSIS — B370 Candidal stomatitis: Secondary | ICD-10-CM | POA: Diagnosis not present

## 2023-12-04 DIAGNOSIS — M549 Dorsalgia, unspecified: Secondary | ICD-10-CM | POA: Diagnosis not present

## 2023-12-04 DIAGNOSIS — E860 Dehydration: Secondary | ICD-10-CM | POA: Diagnosis not present

## 2023-12-04 DIAGNOSIS — C9000 Multiple myeloma not having achieved remission: Secondary | ICD-10-CM | POA: Diagnosis not present

## 2023-12-04 DIAGNOSIS — G629 Polyneuropathy, unspecified: Secondary | ICD-10-CM | POA: Diagnosis not present

## 2023-12-04 NOTE — Progress Notes (Signed)
   12/04/23 1300  Spiritual Encounters  Type of Visit Follow up  Care provided to: Patient  Reason for visit Routine spiritual support   Chaplain was attempting to follow-up with Pt via telephone.  Call was just a routine check in to see how Pt was feeling and if there were any new care giving needs or spiritual issues arising.  Pt did not answer the phone.  Chaplain left voicemail and a promise to check in at Pt's next visit.  Maude Roll, MDiv  Chaplain, [email or web address] [phone]

## 2023-12-05 DIAGNOSIS — C9000 Multiple myeloma not having achieved remission: Secondary | ICD-10-CM | POA: Diagnosis not present

## 2023-12-05 DIAGNOSIS — G629 Polyneuropathy, unspecified: Secondary | ICD-10-CM | POA: Diagnosis not present

## 2023-12-05 DIAGNOSIS — R5381 Other malaise: Secondary | ICD-10-CM | POA: Diagnosis not present

## 2023-12-06 ENCOUNTER — Encounter: Payer: Self-pay | Admitting: Radiation Oncology

## 2023-12-06 NOTE — Progress Notes (Incomplete)
 Radiation Oncology         [phone]) [phone] ________________________________  Name: Jamie Ewing MRN: [id number]  Date: 12/07/2023  DOB: [birth year]  Follow-Up Visit Note  CC: Sheryle Carwin, MD  Sheryle Carwin, MD  No diagnosis found.  Diagnosis: The primary encounter diagnosis was Kappa light chain myeloma (HCC). Diagnoses of Secondary malignant neoplasm of bone (HCC) and Multiple myeloma in relapse [facility]) were also pertinent to this visit.    Stage II IgA kappa multiple myeloma, now with a new orbital roof mass  Interval Since Last Radiation:  1 month  Intent: palliative First Treatment Date: 2023-10-19 Last Treatment Date: 2023-11-06   Plan Name: HN_R_Orbit Site: Orbit Right Technique: IMRT Mode: Photon Dose Per Fraction: 2 Gy Prescribed Dose (Delivered / Prescribed): 2 Gy / 2 Gy Prescribed Fxs (Delivered / Prescribed): 1 / 1   Plan Name: HN_R_Orbit:1 Site: Orbit Right Technique: IMRT Mode: Photon Dose Per Fraction: 2 Gy Prescribed Dose (Delivered / Prescribed): 24 Gy / 24 Gy Prescribed Fxs (Delivered / Prescribed): 12 / 12   Plan Name: Chest_L_Rib Site: Ribs, Left Technique: 3D Mode: Photon Dose Per Fraction: 2.5 Gy Prescribed Dose (Delivered / Prescribed): 25 Gy / 25 Gy Prescribed Fxs (Delivered / Prescribed): 10 / 10   Narrative:  The patient returns today for routine follow-up. She was last seen in office on 10/11/23 for a consultation visit. Since then, patient completed her radiation treatment which she tolerated quite well. Patient did however endorse experiencing  In the interval since she was last seen, she presented to the ED on 11/08/23 complaining of weakness and fatigue. She was given IV fluids at that time and encouraged to keep hydrated.   During a follow up on 11/13/23, Velcade  was decreased to 1 mg/m . Daratumumab  was continued at the same dose.   She returned to the ED on 11/26/23 also complaining of weakness and fatigue following her infusion. She was then  admitted and underwent a CT a/p showing innumerable lytic lesions are noted throughout the visualized skeleton and spine consistent with history of multiple myeloma. She was then started on ceftriaxone .   No other significant oncologic interval history since the patient was last seen.                              Allergies:  has no known allergies.  Meds: Current Outpatient Medications  Medication Sig Dispense Refill   acetaminophen  (TYLENOL ) 500 MG tablet Take 1,000 mg by mouth at bedtime.     acyclovir  (ZOVIRAX ) 400 MG tablet Take 1 tablet (400 mg total) by mouth 2 (two) times daily. 60 tablet 3   calcium carbonate (OSCAL) 1500 (600 Ca) MG TABS tablet Take 600 mg of elemental calcium by mouth daily with breakfast.     clotrimazole  (MYCELEX ) 10 MG troche Take 1 tablet (10 mg total) by mouth 5 (five) times daily. X 5 days     dexamethasone  (DECADRON ) 4 MG tablet Take 1 tablet (4 mg total) by mouth once a week. Take 20 mg by mouth weekly the day after darzalex  injection 20 tablet 2   feeding supplement (ENSURE PLUS HIGH PROTEIN) LIQD Take 237 mLs by mouth 2 (two) times daily between meals.     furosemide  (LASIX ) 40 MG tablet Take 1 tablet (40 mg total) by mouth daily as needed. (Patient taking differently: Take 40 mg by mouth daily as needed for edema or fluid.) 30 tablet 2  LOW-DOSE ASPIRIN PO Take 81 mg by mouth at bedtime.     Multiple Vitamins-Minerals (THERA-M) TABS Take 1 tablet by mouth daily.     prochlorperazine  (COMPAZINE ) 10 MG tablet Take 1 tablet (10 mg total) by mouth every 6 (six) hours as needed. (Patient taking differently: Take 10 mg by mouth every 6 (six) hours as needed for vomiting or nausea.) 60 tablet 2   vitamin B-12 (CYANOCOBALAMIN ) 1000 MCG tablet Take 1,000 mcg by mouth daily.     No current facility-administered medications for this visit.    Physical Findings: The patient is in no acute distress. Patient is alert and oriented.  vitals were not taken for this  visit. .  No significant changes. Lungs are clear to auscultation bilaterally. Heart has regular rate and rhythm. No palpable cervical, supraclavicular, or axillary adenopathy. Abdomen soft, non-tender, normal bowel sounds.   Lab Findings: Lab Results  Component Value Date   WBC 4.5 11/28/2023   HGB 10.8 (L) 11/28/2023   HCT 31.7 (L) 11/28/2023   MCV 98.4 11/28/2023   PLT 44 (L) 11/28/2023    Radiographic Findings: CT ABDOMEN PELVIS W CONTRAST Result Date: 11/26/2023 CLINICAL DATA:  Diffuse abdominal pain. EXAM: CT ABDOMEN AND PELVIS WITH CONTRAST TECHNIQUE: Multidetector CT imaging of the abdomen and pelvis was performed using the standard protocol following bolus administration of intravenous contrast. RADIATION DOSE REDUCTION: This exam was performed according to the departmental dose-optimization program which includes automated exposure control, adjustment of the mA and/or kV according to patient size and/or use of iterative reconstruction technique. CONTRAST:  OMNIPAQUE  IOHEXOL  300 MG/ML  SOLN COMPARISON:  August 24, 2023. FINDINGS: Lower chest: No acute abnormality. Hepatobiliary: No focal liver abnormality is seen. No gallstones, gallbladder wall thickening, or biliary dilatation. Pancreas: Unremarkable. No pancreatic ductal dilatation or surrounding inflammatory changes. Spleen: Normal in size without focal abnormality. Adrenals/Urinary Tract: Adrenal glands appear normal. Right renal cysts are noted. No hydronephrosis or renal obstruction is noted. Urinary bladder is unremarkable. Stomach/Bowel: Stomach is unremarkable. There is no evidence of bowel obstruction or inflammation. The appendix is not clearly visualized. Vascular/Lymphatic: Aortic atherosclerosis. No enlarged abdominal or pelvic lymph nodes. Reproductive: Uterus and bilateral adnexa are unremarkable. Other: No pneumothorax or pleural effusion is noted. Musculoskeletal: Innumerable lytic lesions are noted throughout the  visualized skeleton and spine consistent with history of multiple myeloma. IMPRESSION: 1. Innumerable lytic lesions are noted throughout the visualized skeleton and spine consistent with history of multiple myeloma. 2. No acute abnormality seen in the abdomen or pelvis. 3. Aortic atherosclerosis. Aortic Atherosclerosis (ICD10-I70.0). Electronically Signed   By: Lynwood Landy Raddle M.D.   On: 11/26/2023 13:21   DG Chest Port 1 View Result Date: 11/26/2023 CLINICAL DATA:  Weakness.  Multiple myeloma. EXAM: PORTABLE CHEST 1 VIEW COMPARISON:  11/08/2023 FINDINGS: The heart size and mediastinal contours are within normal limits. Both lungs are clear. Sclerotic bone lesion again seen involving the left posterior 4th rib. Lower thoracic spine fusion hardware again noted. IMPRESSION: No active cardiopulmonary disease. Stable sclerotic bone lesion involving the left posterior 4th rib. Electronically Signed   By: Norleen DELENA Kil M.D.   On: 11/26/2023 11:33   DG Chest Port 1 View Result Date: 11/08/2023 CLINICAL DATA:  Thrombocytopenia. Undergoing chemotherapy and radiation therapy. EXAM: PORTABLE CHEST 1 VIEW COMPARISON:  08/14/2022 and CT chest 01/11/2007. FINDINGS: Trachea is midline. Heart size normal. Lungs are clear. No pleural fluid. Lucent lesion in the lateral left clavicle is again noted. Lower thoracic spinal hardware.  IMPRESSION: 1. No acute findings. 2. Lytic lesion in the lateral left clavicle, compatible with multiple myeloma. Electronically Signed   By: Newell Eke M.D.   On: 11/08/2023 17:56    Impression: Multiple myeloma not having achieved remission   The patient is recovering from the effects of radiation.  ***  Plan:  ***   *** minutes of total time was spent for this patient encounter, including preparation, face-to-face counseling with the patient and coordination of care, physical exam, and documentation of the encounter. ____________________________________  Lynwood CHARM Nasuti, PhD,  MD  This document serves as a record of services personally performed by Lynwood Nasuti, MD. It was created on his behalf by Reymundo Cartwright, a trained medical scribe. The creation of this record is based on the scribe's personal observations and the provider's statements to them. This document has been checked and approved by the attending provider.

## 2023-12-07 ENCOUNTER — Ambulatory Visit: Admitting: Radiation Oncology

## 2023-12-07 ENCOUNTER — Inpatient Hospital Stay

## 2023-12-07 ENCOUNTER — Telehealth: Payer: Self-pay | Admitting: Radiation Oncology

## 2023-12-07 NOTE — Telephone Encounter (Addendum)
 9/18 patient no show for today appt, r/s after receiving message from nursing.  Patient is currently in [facility] and now setup through facility's transportation to bring patient to appt on 9/29 -per Amy.

## 2023-12-08 DIAGNOSIS — R531 Weakness: Secondary | ICD-10-CM | POA: Diagnosis not present

## 2023-12-08 DIAGNOSIS — G629 Polyneuropathy, unspecified: Secondary | ICD-10-CM | POA: Diagnosis not present

## 2023-12-08 DIAGNOSIS — R197 Diarrhea, unspecified: Secondary | ICD-10-CM | POA: Diagnosis not present

## 2023-12-08 DIAGNOSIS — I10 Essential (primary) hypertension: Secondary | ICD-10-CM | POA: Diagnosis not present

## 2023-12-08 DIAGNOSIS — C9000 Multiple myeloma not having achieved remission: Secondary | ICD-10-CM | POA: Diagnosis not present

## 2023-12-08 DIAGNOSIS — R5381 Other malaise: Secondary | ICD-10-CM | POA: Diagnosis not present

## 2023-12-08 DIAGNOSIS — M549 Dorsalgia, unspecified: Secondary | ICD-10-CM | POA: Diagnosis not present

## 2023-12-08 DIAGNOSIS — E860 Dehydration: Secondary | ICD-10-CM | POA: Diagnosis not present

## 2023-12-08 DIAGNOSIS — B370 Candidal stomatitis: Secondary | ICD-10-CM | POA: Diagnosis not present

## 2023-12-11 ENCOUNTER — Inpatient Hospital Stay

## 2023-12-12 DIAGNOSIS — D61818 Other pancytopenia: Secondary | ICD-10-CM | POA: Diagnosis not present

## 2023-12-12 DIAGNOSIS — E441 Mild protein-calorie malnutrition: Secondary | ICD-10-CM | POA: Diagnosis not present

## 2023-12-12 DIAGNOSIS — I119 Hypertensive heart disease without heart failure: Secondary | ICD-10-CM | POA: Diagnosis not present

## 2023-12-12 DIAGNOSIS — D696 Thrombocytopenia, unspecified: Secondary | ICD-10-CM | POA: Diagnosis not present

## 2023-12-12 DIAGNOSIS — C9000 Multiple myeloma not having achieved remission: Secondary | ICD-10-CM | POA: Diagnosis not present

## 2023-12-12 DIAGNOSIS — R1310 Dysphagia, unspecified: Secondary | ICD-10-CM | POA: Diagnosis not present

## 2023-12-12 DIAGNOSIS — G629 Polyneuropathy, unspecified: Secondary | ICD-10-CM | POA: Diagnosis not present

## 2023-12-12 DIAGNOSIS — M4804 Spinal stenosis, thoracic region: Secondary | ICD-10-CM | POA: Diagnosis not present

## 2023-12-12 DIAGNOSIS — I350 Nonrheumatic aortic (valve) stenosis: Secondary | ICD-10-CM | POA: Diagnosis not present

## 2023-12-14 ENCOUNTER — Inpatient Hospital Stay

## 2023-12-14 DIAGNOSIS — Z79899 Other long term (current) drug therapy: Secondary | ICD-10-CM | POA: Diagnosis not present

## 2023-12-14 DIAGNOSIS — E860 Dehydration: Secondary | ICD-10-CM | POA: Diagnosis not present

## 2023-12-14 DIAGNOSIS — C9000 Multiple myeloma not having achieved remission: Secondary | ICD-10-CM | POA: Diagnosis not present

## 2023-12-14 DIAGNOSIS — R600 Localized edema: Secondary | ICD-10-CM | POA: Diagnosis not present

## 2023-12-14 DIAGNOSIS — I10 Essential (primary) hypertension: Secondary | ICD-10-CM | POA: Diagnosis not present

## 2023-12-14 DIAGNOSIS — R531 Weakness: Secondary | ICD-10-CM | POA: Diagnosis not present

## 2023-12-14 DIAGNOSIS — B370 Candidal stomatitis: Secondary | ICD-10-CM | POA: Diagnosis not present

## 2023-12-14 DIAGNOSIS — D6951 Posttransfusion purpura: Secondary | ICD-10-CM | POA: Diagnosis not present

## 2023-12-15 NOTE — Progress Notes (Signed)
 Radiation Oncology         [phone] ________________________________  Name: Jamie Ewing MRN: [id number]  Date: 12/18/2023  DOB: November 12, 1940  Follow-Up Visit Note  CC: Sheryle Carwin, MD  Sheryle Carwin, MD    ICD-10-CM   1. Metastatic multiple myeloma to bone (HCC) [C90.00]  C90.00       Diagnosis:  The primary encounter diagnosis was Kappa light chain myeloma (HCC). Diagnoses of Secondary malignant neoplasm of bone (HCC) and Multiple myeloma in relapse [facility]) were also pertinent to this visit.    Stage II IgA kappa multiple myeloma -presented with an orbital roof mass in July of 2025; s/p radiation therapy; currently undergoing systemic therapy   Interval Since Last Radiation: 1 month and 11 days   3) Treatment Intent: Palliative  Radiation Treatment Dates: First Treatment Date: 2023-10-19 -- Last Treatment Date: 2023-11-06 Site/Dose/Technique/Mode:  Plan Name: HN_R_Orbit Site: Orbit Right Technique: IMRT Mode: Photon Dose Per Fraction: 2 Gy Prescribed Dose (Delivered / Prescribed): 2 Gy / 2 Gy Prescribed Fxs (Delivered / Prescribed): 1 / 1   Plan Name: HN_R_Orbit:1 Site: Orbit Right Technique: IMRT Mode: Photon Dose Per Fraction: 2 Gy Prescribed Dose (Delivered / Prescribed): 24 Gy / 24 Gy Prescribed Fxs (Delivered / Prescribed): 12 / 12   Plan Name: Chest_L_Rib Site: Ribs, Left Technique: 3D Mode: Photon Dose Per Fraction: 2.5 Gy Prescribed Dose (Delivered / Prescribed): 25 Gy / 25 Gy Prescribed Fxs (Delivered / Prescribed): 10 / 10  2) Indication for treatment: Palliative       Radiation treatment dates: 11/03/22 through 11/16/22  Site/dose:  1) Right ribs - 25 Gy delivered in 10 Fx at 2.5 Gy/Fx  2) Left femur - 25 Gy delivered in 10 Fx at 2.5 Gy/Fx Technique/Mode: 3D, Isodose Plan / Photon  Beams/energy: 10X  1) Indication for treatment:  Palliative      Radiation treatment dates:   10/30/17 - 11/10/17 Site/dose:   The thoracic spine target at T9-T10 was  treated to 20 Gy in 10 fractions of 2 Gy. Beams/energy:   3D, Photons/ 10X  Narrative:  The patient returns today for routine follow-up. She tolerated radiation therapy relatively well overall other than mild fatigue and hyperpigmentation in the supraorbital area throughout the course of treatment. She did report achieving significant improvement in her vision out of the right eye and in her chest wall pain with treatment.       In the interval since she began radiation therapy on 07/31, she has continued to receive daratumumab  and Velcade  at a reduced dose (secondary to thrombocytopenia) under Dr. Rogers. She has also continued to receive zometa  every 12 weeks.             In recent history, she was hospitalized from 11/26/23 through 11/28/23 for management of weakness and dehydration x2 days after receiving her most recent cycle of systemic therapy on 09/05. She also endorsed some nausea, abdominal pain, diarrhea, and a decrease in appetite during her hospital stay (all perceived as secondary to systemic therapy). Her hospital course primarily consisted of IV fluids and observation. Imaging performed while inpatient consisted of a CT AP on 09/07 which redemonstrated the innumerable lytic lesions throughout the visualized skeleton and spine consistent with her known history of multiple myeloma. Imaging otherwise showed no acute findings in the abdomen or pelvis.  She was also seen in the ED prior to this on 11/08/23 due to generalized weakness and dehydration. Labs in the ED were notable for thrombocytopenia  and she was given IV fluids. Her platelet counts improved and she was discharged home in stable condition.        Patient notes that the pain near her eye and rib has completely gone away since completing her radiation treatment. She also states that her vision has improved since finishing her radiotherapy.         Allergies:  has no known allergies.  Meds: Current Outpatient Medications   Medication Sig Dispense Refill   acetaminophen  (TYLENOL ) 500 MG tablet Take 1,000 mg by mouth at bedtime. (Patient taking differently: Take 1,000 mg by mouth every 4 (four) hours as needed.)     acyclovir  (ZOVIRAX ) 400 MG tablet Take 1 tablet (400 mg total) by mouth 2 (two) times daily. 60 tablet 3   calcium carbonate (OSCAL) 1500 (600 Ca) MG TABS tablet Take 600 mg of elemental calcium by mouth daily with breakfast.     feeding supplement (ENSURE PLUS HIGH PROTEIN) LIQD Take 237 mLs by mouth 2 (two) times daily between meals.     fluconazole  (DIFLUCAN ) 150 MG tablet Take 150 mg by mouth daily.     Multiple Vitamins-Minerals (THERA-M) TABS Take 1 tablet by mouth daily.     prochlorperazine  (COMPAZINE ) 10 MG tablet Take 1 tablet (10 mg total) by mouth every 6 (six) hours as needed. 60 tablet 2   clotrimazole  (MYCELEX ) 10 MG troche Take 1 tablet (10 mg total) by mouth 5 (five) times daily. X 5 days (Patient not taking: Reported on 12/18/2023)     dexamethasone  (DECADRON ) 4 MG tablet Take 1 tablet (4 mg total) by mouth once a week. Take 20 mg by mouth weekly the day after darzalex  injection (Patient not taking: Reported on 12/18/2023) 20 tablet 2   furosemide  (LASIX ) 40 MG tablet Take 1 tablet (40 mg total) by mouth daily as needed. (Patient not taking: Reported on 12/18/2023) 30 tablet 2   LOW-DOSE ASPIRIN PO Take 81 mg by mouth at bedtime. (Patient not taking: Reported on 12/18/2023)     vitamin B-12 (CYANOCOBALAMIN ) 1000 MCG tablet Take 1,000 mcg by mouth daily. (Patient not taking: Reported on 12/18/2023)     No current facility-administered medications for this encounter.    Physical Findings: The patient is in no acute distress. Patient is alert and oriented.  height is 5' 3 (1.6 m) (pended) and weight is 145 lb 2 oz (65.8 kg) (pended). Her temporal temperature is 96.6 F (35.9 C) (abnormal, pended). Her blood pressure is 147/77 (abnormal, pended) and her pulse is 84 (pended). Her respiration is  18 (pended). .  No significant changes. Lungs are clear to auscultation bilaterally. Heart has systolic murmur, regular rate. No palpable cervical, supraclavicular, or axillary adenopathy. Abdomen soft, non-tender, normal bowel sounds.   Lab Findings: Lab Results  Component Value Date   WBC 4.5 11/28/2023   HGB 10.8 (L) 11/28/2023   HCT 31.7 (L) 11/28/2023   MCV 98.4 11/28/2023   PLT 44 (L) 11/28/2023    Radiographic Findings: CT ABDOMEN PELVIS W CONTRAST Result Date: 11/26/2023 CLINICAL DATA:  Diffuse abdominal pain. EXAM: CT ABDOMEN AND PELVIS WITH CONTRAST TECHNIQUE: Multidetector CT imaging of the abdomen and pelvis was performed using the standard protocol following bolus administration of intravenous contrast. RADIATION DOSE REDUCTION: This exam was performed according to the departmental dose-optimization program which includes automated exposure control, adjustment of the mA and/or kV according to patient size and/or use of iterative reconstruction technique. CONTRAST:  OMNIPAQUE  IOHEXOL  300 MG/ML  SOLN  COMPARISON:  August 24, 2023. FINDINGS: Lower chest: No acute abnormality. Hepatobiliary: No focal liver abnormality is seen. No gallstones, gallbladder wall thickening, or biliary dilatation. Pancreas: Unremarkable. No pancreatic ductal dilatation or surrounding inflammatory changes. Spleen: Normal in size without focal abnormality. Adrenals/Urinary Tract: Adrenal glands appear normal. Right renal cysts are noted. No hydronephrosis or renal obstruction is noted. Urinary bladder is unremarkable. Stomach/Bowel: Stomach is unremarkable. There is no evidence of bowel obstruction or inflammation. The appendix is not clearly visualized. Vascular/Lymphatic: Aortic atherosclerosis. No enlarged abdominal or pelvic lymph nodes. Reproductive: Uterus and bilateral adnexa are unremarkable. Other: No pneumothorax or pleural effusion is noted. Musculoskeletal: Innumerable lytic lesions are noted throughout  the visualized skeleton and spine consistent with history of multiple myeloma. IMPRESSION: 1. Innumerable lytic lesions are noted throughout the visualized skeleton and spine consistent with history of multiple myeloma. 2. No acute abnormality seen in the abdomen or pelvis. 3. Aortic atherosclerosis. Aortic Atherosclerosis (ICD10-I70.0). Electronically Signed   By: Lynwood Landy Raddle M.D.   On: 11/26/2023 13:21   DG Chest Port 1 View Result Date: 11/26/2023 CLINICAL DATA:  Weakness.  Multiple myeloma. EXAM: PORTABLE CHEST 1 VIEW COMPARISON:  11/08/2023 FINDINGS: The heart size and mediastinal contours are within normal limits. Both lungs are clear. Sclerotic bone lesion again seen involving the left posterior 4th rib. Lower thoracic spine fusion hardware again noted. IMPRESSION: No active cardiopulmonary disease. Stable sclerotic bone lesion involving the left posterior 4th rib. Electronically Signed   By: Norleen DELENA Kil M.D.   On: 11/26/2023 11:33    Impression/Plan:  Stage II IgA kappa multiple myeloma -presented with an orbital roof mass in July of 2025; s/p radiation therapy; currently undergoing systemic therapy   The patient has healed well from the effects of her radiation treatment. She fortunately has experienced an improvement in her symptoms since completing her treatment.   She will continue follow-up with Dr. Davonna. We will reach out to her office to ensure she is scheduled with them. Radiation follow-up PRN. We appreciate the opportunity to take part in this patient's care. She was encouraged to call back with any questions or concerns.    20 minutes of total time was spent for this patient encounter, including preparation, face-to-face counseling with the patient and coordination of care, physical exam, and documentation of the encounter. ____________________________________    Leeroy Due, PA-C   This document serves as a record of services personally performed by Leeroy Due, PA-C. It  was created on her behalf by Dorthy Fuse, a trained medical scribe. The creation of this record is based on the scribe's personal observations and the provider's statements to them. This document has been checked and approved by the attending provider.

## 2023-12-18 ENCOUNTER — Ambulatory Visit
Admission: RE | Admit: 2023-12-18 | Discharge: 2023-12-18 | Disposition: A | Discharge status: Home / Self Care | Source: Ambulatory Visit | Attending: Radiation Oncology | Admitting: Radiation Oncology

## 2023-12-18 ENCOUNTER — Encounter: Payer: Self-pay | Admitting: Radiation Oncology

## 2023-12-18 DIAGNOSIS — I700 Atherosclerosis of aorta: Secondary | ICD-10-CM | POA: Diagnosis not present

## 2023-12-18 DIAGNOSIS — Z79624 Long term (current) use of inhibitors of nucleotide synthesis: Secondary | ICD-10-CM | POA: Diagnosis not present

## 2023-12-18 DIAGNOSIS — R531 Weakness: Secondary | ICD-10-CM | POA: Insufficient documentation

## 2023-12-18 DIAGNOSIS — D696 Thrombocytopenia, unspecified: Secondary | ICD-10-CM | POA: Insufficient documentation

## 2023-12-18 DIAGNOSIS — Z923 Personal history of irradiation: Secondary | ICD-10-CM | POA: Diagnosis not present

## 2023-12-18 DIAGNOSIS — E860 Dehydration: Secondary | ICD-10-CM | POA: Insufficient documentation

## 2023-12-18 DIAGNOSIS — N281 Cyst of kidney, acquired: Secondary | ICD-10-CM | POA: Insufficient documentation

## 2023-12-18 DIAGNOSIS — Z7982 Long term (current) use of aspirin: Secondary | ICD-10-CM | POA: Diagnosis not present

## 2023-12-18 DIAGNOSIS — Z7952 Long term (current) use of systemic steroids: Secondary | ICD-10-CM | POA: Insufficient documentation

## 2023-12-18 DIAGNOSIS — C9000 Multiple myeloma not having achieved remission: Secondary | ICD-10-CM | POA: Insufficient documentation

## 2023-12-18 NOTE — Progress Notes (Signed)
 Jamie Ewing is here today for follow up post radiation chest and right orbit   Does the patient complain of any of the following: Pain: No Shortness of breath w/wo exertion: No Cough: No Hemoptysis: No Pain with swallowing: Yes, Patient currently taking Fluconazole  x 10 days for thrush.  Swallowing/choking concerns: Yes, difficulty with solid foods.  Appetite: Fair, patient drinking 1 high protein ensure daily.  Energy Level: Low Post radiation skin Changes: No Vision: Improved     Additional comments if applicable:   BP (!) (P) 147/77 (BP Location: Left Arm, Patient Position: Sitting)   Pulse (P) 84   Temp (!) (P) 96.6 F (35.9 C) (Temporal)   Resp (P) 18   Ht (P) 5' 3 (1.6 m)   Wt (P) 145 lb 2 oz (65.8 kg)   BMI (P) 25.71 kg/m

## 2023-12-19 DIAGNOSIS — I119 Hypertensive heart disease without heart failure: Secondary | ICD-10-CM | POA: Diagnosis not present

## 2023-12-19 DIAGNOSIS — I350 Nonrheumatic aortic (valve) stenosis: Secondary | ICD-10-CM | POA: Diagnosis not present

## 2023-12-19 DIAGNOSIS — M4804 Spinal stenosis, thoracic region: Secondary | ICD-10-CM | POA: Diagnosis not present

## 2023-12-19 DIAGNOSIS — D61818 Other pancytopenia: Secondary | ICD-10-CM | POA: Diagnosis not present

## 2023-12-19 DIAGNOSIS — D696 Thrombocytopenia, unspecified: Secondary | ICD-10-CM | POA: Diagnosis not present

## 2023-12-19 DIAGNOSIS — E441 Mild protein-calorie malnutrition: Secondary | ICD-10-CM | POA: Diagnosis not present

## 2023-12-19 DIAGNOSIS — G629 Polyneuropathy, unspecified: Secondary | ICD-10-CM | POA: Diagnosis not present

## 2023-12-19 DIAGNOSIS — C9000 Multiple myeloma not having achieved remission: Secondary | ICD-10-CM | POA: Diagnosis not present

## 2023-12-19 DIAGNOSIS — R1310 Dysphagia, unspecified: Secondary | ICD-10-CM | POA: Diagnosis not present

## 2023-12-20 DIAGNOSIS — D61818 Other pancytopenia: Secondary | ICD-10-CM | POA: Diagnosis not present

## 2023-12-20 DIAGNOSIS — I119 Hypertensive heart disease without heart failure: Secondary | ICD-10-CM | POA: Diagnosis not present

## 2023-12-20 DIAGNOSIS — R1310 Dysphagia, unspecified: Secondary | ICD-10-CM | POA: Diagnosis not present

## 2023-12-20 DIAGNOSIS — M4804 Spinal stenosis, thoracic region: Secondary | ICD-10-CM | POA: Diagnosis not present

## 2023-12-20 DIAGNOSIS — G629 Polyneuropathy, unspecified: Secondary | ICD-10-CM | POA: Diagnosis not present

## 2023-12-20 DIAGNOSIS — C9000 Multiple myeloma not having achieved remission: Secondary | ICD-10-CM | POA: Diagnosis not present

## 2023-12-20 DIAGNOSIS — E441 Mild protein-calorie malnutrition: Secondary | ICD-10-CM | POA: Diagnosis not present

## 2023-12-20 DIAGNOSIS — D696 Thrombocytopenia, unspecified: Secondary | ICD-10-CM | POA: Diagnosis not present

## 2023-12-22 ENCOUNTER — Other Ambulatory Visit: Payer: Self-pay

## 2023-12-22 DIAGNOSIS — B370 Candidal stomatitis: Secondary | ICD-10-CM | POA: Diagnosis not present

## 2023-12-22 DIAGNOSIS — C9000 Multiple myeloma not having achieved remission: Secondary | ICD-10-CM | POA: Diagnosis not present

## 2023-12-22 MED ORDER — ACYCLOVIR 400 MG PO TABS: 400.0000 mg | ORAL_TABLET | Freq: Two times a day (BID) | ORAL | 3 refills | Status: DC

## 2023-12-26 DIAGNOSIS — I119 Hypertensive heart disease without heart failure: Secondary | ICD-10-CM | POA: Diagnosis not present

## 2023-12-26 DIAGNOSIS — I350 Nonrheumatic aortic (valve) stenosis: Secondary | ICD-10-CM | POA: Diagnosis not present

## 2023-12-26 DIAGNOSIS — D61818 Other pancytopenia: Secondary | ICD-10-CM | POA: Diagnosis not present

## 2023-12-26 DIAGNOSIS — R1310 Dysphagia, unspecified: Secondary | ICD-10-CM | POA: Diagnosis not present

## 2023-12-26 DIAGNOSIS — M4804 Spinal stenosis, thoracic region: Secondary | ICD-10-CM | POA: Diagnosis not present

## 2023-12-26 DIAGNOSIS — G629 Polyneuropathy, unspecified: Secondary | ICD-10-CM | POA: Diagnosis not present

## 2023-12-26 DIAGNOSIS — D696 Thrombocytopenia, unspecified: Secondary | ICD-10-CM | POA: Diagnosis not present

## 2023-12-26 DIAGNOSIS — C9000 Multiple myeloma not having achieved remission: Secondary | ICD-10-CM | POA: Diagnosis not present

## 2023-12-28 DIAGNOSIS — C9000 Multiple myeloma not having achieved remission: Secondary | ICD-10-CM | POA: Diagnosis not present

## 2023-12-28 DIAGNOSIS — E441 Mild protein-calorie malnutrition: Secondary | ICD-10-CM | POA: Diagnosis not present

## 2023-12-28 DIAGNOSIS — M4804 Spinal stenosis, thoracic region: Secondary | ICD-10-CM | POA: Diagnosis not present

## 2023-12-28 DIAGNOSIS — D696 Thrombocytopenia, unspecified: Secondary | ICD-10-CM | POA: Diagnosis not present

## 2023-12-28 DIAGNOSIS — I350 Nonrheumatic aortic (valve) stenosis: Secondary | ICD-10-CM | POA: Diagnosis not present

## 2023-12-28 DIAGNOSIS — R1310 Dysphagia, unspecified: Secondary | ICD-10-CM | POA: Diagnosis not present

## 2023-12-28 DIAGNOSIS — D61818 Other pancytopenia: Secondary | ICD-10-CM | POA: Diagnosis not present

## 2023-12-28 DIAGNOSIS — I119 Hypertensive heart disease without heart failure: Secondary | ICD-10-CM | POA: Diagnosis not present

## 2023-12-28 DIAGNOSIS — G629 Polyneuropathy, unspecified: Secondary | ICD-10-CM | POA: Diagnosis not present

## 2024-01-01 DIAGNOSIS — Z79899 Other long term (current) drug therapy: Secondary | ICD-10-CM | POA: Diagnosis not present

## 2024-01-01 DIAGNOSIS — D6951 Posttransfusion purpura: Secondary | ICD-10-CM | POA: Diagnosis not present

## 2024-01-01 DIAGNOSIS — I10 Essential (primary) hypertension: Secondary | ICD-10-CM | POA: Diagnosis not present

## 2024-01-01 DIAGNOSIS — C9000 Multiple myeloma not having achieved remission: Secondary | ICD-10-CM | POA: Diagnosis not present

## 2024-01-01 DIAGNOSIS — R531 Weakness: Secondary | ICD-10-CM | POA: Diagnosis not present

## 2024-01-01 DIAGNOSIS — E860 Dehydration: Secondary | ICD-10-CM | POA: Diagnosis not present

## 2024-01-02 DIAGNOSIS — E441 Mild protein-calorie malnutrition: Secondary | ICD-10-CM | POA: Diagnosis not present

## 2024-01-02 DIAGNOSIS — R1310 Dysphagia, unspecified: Secondary | ICD-10-CM | POA: Diagnosis not present

## 2024-01-02 DIAGNOSIS — D696 Thrombocytopenia, unspecified: Secondary | ICD-10-CM | POA: Diagnosis not present

## 2024-01-02 DIAGNOSIS — G629 Polyneuropathy, unspecified: Secondary | ICD-10-CM | POA: Diagnosis not present

## 2024-01-02 DIAGNOSIS — M4804 Spinal stenosis, thoracic region: Secondary | ICD-10-CM | POA: Diagnosis not present

## 2024-01-02 DIAGNOSIS — D61818 Other pancytopenia: Secondary | ICD-10-CM | POA: Diagnosis not present

## 2024-01-02 DIAGNOSIS — I350 Nonrheumatic aortic (valve) stenosis: Secondary | ICD-10-CM | POA: Diagnosis not present

## 2024-01-02 DIAGNOSIS — C9000 Multiple myeloma not having achieved remission: Secondary | ICD-10-CM | POA: Diagnosis not present

## 2024-01-03 DIAGNOSIS — B370 Candidal stomatitis: Secondary | ICD-10-CM | POA: Diagnosis not present

## 2024-01-03 DIAGNOSIS — E876 Hypokalemia: Secondary | ICD-10-CM | POA: Diagnosis not present

## 2024-01-03 DIAGNOSIS — C9000 Multiple myeloma not having achieved remission: Secondary | ICD-10-CM | POA: Diagnosis not present

## 2024-01-03 DIAGNOSIS — F321 Major depressive disorder, single episode, moderate: Secondary | ICD-10-CM | POA: Diagnosis not present

## 2024-01-05 ENCOUNTER — Inpatient Hospital Stay: Attending: Hematology | Admitting: Oncology

## 2024-01-05 ENCOUNTER — Inpatient Hospital Stay

## 2024-01-05 VITALS — BP 126/74 | HR 100 | Temp 98.3°F | Resp 18

## 2024-01-05 DIAGNOSIS — Z79899 Other long term (current) drug therapy: Secondary | ICD-10-CM | POA: Diagnosis not present

## 2024-01-05 DIAGNOSIS — I350 Nonrheumatic aortic (valve) stenosis: Secondary | ICD-10-CM | POA: Diagnosis not present

## 2024-01-05 DIAGNOSIS — C7951 Secondary malignant neoplasm of bone: Secondary | ICD-10-CM | POA: Insufficient documentation

## 2024-01-05 DIAGNOSIS — M4804 Spinal stenosis, thoracic region: Secondary | ICD-10-CM | POA: Diagnosis not present

## 2024-01-05 DIAGNOSIS — D61818 Other pancytopenia: Secondary | ICD-10-CM | POA: Diagnosis not present

## 2024-01-05 DIAGNOSIS — C9000 Multiple myeloma not having achieved remission: Secondary | ICD-10-CM

## 2024-01-05 DIAGNOSIS — R1310 Dysphagia, unspecified: Secondary | ICD-10-CM | POA: Diagnosis not present

## 2024-01-05 DIAGNOSIS — G629 Polyneuropathy, unspecified: Secondary | ICD-10-CM | POA: Insufficient documentation

## 2024-01-05 DIAGNOSIS — B379 Candidiasis, unspecified: Secondary | ICD-10-CM | POA: Diagnosis not present

## 2024-01-05 DIAGNOSIS — R609 Edema, unspecified: Secondary | ICD-10-CM | POA: Insufficient documentation

## 2024-01-05 DIAGNOSIS — D696 Thrombocytopenia, unspecified: Secondary | ICD-10-CM | POA: Diagnosis not present

## 2024-01-05 DIAGNOSIS — E441 Mild protein-calorie malnutrition: Secondary | ICD-10-CM | POA: Diagnosis not present

## 2024-01-05 DIAGNOSIS — I119 Hypertensive heart disease without heart failure: Secondary | ICD-10-CM | POA: Diagnosis not present

## 2024-01-05 LAB — COMPREHENSIVE METABOLIC PANEL WITH GFR
ALT: 21 U/L (ref 0–44)
AST: 24 U/L (ref 15–41)
Albumin: 4.2 g/dL (ref 3.5–5.0)
Alkaline Phosphatase: 56 U/L (ref 38–126)
Anion gap: 13 (ref 5–15)
BUN: 7 mg/dL — ABNORMAL LOW (ref 8–23)
CO2: 26 mmol/L (ref 22–32)
Calcium: 10.1 mg/dL (ref 8.9–10.3)
Chloride: 100 mmol/L (ref 98–111)
Creatinine, Ser: 0.74 mg/dL (ref 0.44–1.00)
GFR, Estimated: >60 mL/min (ref >60)
Glucose, Bld: 130 mg/dL — ABNORMAL HIGH (ref 70–99)
Potassium: 4.8 mmol/L (ref 3.5–5.1)
Sodium: 139 mmol/L (ref 135–145)
Total Bilirubin: 0.6 mg/dL (ref 0.0–1.2)
Total Protein: 6.6 g/dL (ref 6.5–8.1)

## 2024-01-05 LAB — CBC WITH DIFFERENTIAL/PLATELET
Abs Immature Granulocytes: 0.02 K/uL (ref 0.00–0.07)
Basophils Absolute: 0 K/uL (ref 0.0–0.1)
Basophils Relative: 0 %
Eosinophils Absolute: 0 K/uL (ref 0.0–0.5)
Eosinophils Relative: 1 %
HCT: 38.4 % (ref 36.0–46.0)
Hemoglobin: 12.4 g/dL (ref 12.0–15.0)
Immature Granulocytes: 0 %
Lymphocytes Relative: 23 %
Lymphs Abs: 1 K/uL (ref 0.7–4.0)
MCH: 34.4 pg — ABNORMAL HIGH (ref 26.0–34.0)
MCHC: 32.3 g/dL (ref 30.0–36.0)
MCV: 106.7 fL — ABNORMAL HIGH (ref 80.0–100.0)
Monocytes Absolute: 0.8 K/uL (ref 0.1–1.0)
Monocytes Relative: 17 %
Neutro Abs: 2.6 K/uL (ref 1.7–7.7)
Neutrophils Relative %: 59 %
Platelets: 212 K/uL (ref 150–400)
RBC: 3.6 MIL/uL — ABNORMAL LOW (ref 3.87–5.11)
RDW: 15.9 % — ABNORMAL HIGH (ref 11.5–15.5)
WBC: 4.5 K/uL (ref 4.0–10.5)
nRBC: 0 % (ref 0.0–0.2)

## 2024-01-05 NOTE — Progress Notes (Signed)
 Patient Care Team: Sheryle Carwin, MD as PCP - General (Internal Medicine) Stacia Diannah SQUIBB, MD as PCP - Cardiology (Cardiology)  Clinic Day:  01/05/2024  Referring physician: Sheryle Carwin, MD   CHIEF COMPLAINT:  CC: Recurrent stage II IgA kappa multiple myeloma, high risk    ASSESSMENT & PLAN:   Assessment & Plan: Jamie Ewing  is a 83 y.o. female with recurrent stage II IgA kappa multiple myeloma, high risk  Multiple Myeloma not having achieved remission Stage II IgA kappa multiple myeloma, standard risk Patient with complicated multiple myeloma history s/p several local XRT's, induction with RVD followed by bone marrow transplant in 2020.  She was then on Revlimid  maintenance for 5 years until recent progression Oncology history as below Patient is currently on daratumumab  +Velcade  +dexamethasone    -Patient was admitted to the hospital after last chemotherapy for fatigue and weakness.  She is very reluctant to continue with the same regimen at this time. - Discussed doing single agent daratumumab  along with dexamethasone  monthly and assess for tolerance.  If she has progression at any point can add Velcade  every 2 weeks.  Patient is agreeable to this. -Reemphasized most common side effects of upper respite tract infection with daratumumab . - Continue acyclovir  twice daily for prophylaxis. - Labs reviewed today: CMP: Stable, CBC: WBC: 4.5,hemoglobin: 12.4, platelets: 212. - Will start single agent daratumumab  with dexamethasone  monthly starting 02/08/2024 per patient's request  Return to clinic in 2 months with labs for follow-up and to assess for tolerance.  Metastatic multiple myeloma to bone Patient has multiple bone lesions secondary to multiple myeloma He is on Zometa  4 mg every 12 weeks   - Continue zometa  4 mg every 12 weeks - Continue vitamin D  and calcium  Other polyneuropathy Patient has some numbness and tingling in her hands and feet Currently not taking any  medications and is tolerable   - Continue to monitor for now  Pedal Edema Patient has bilateral pedal edema extending up to the ankle Likely secondary to steroid use.  Improved significantly.   - Continue Lasix  40 mg taken as needed -Continue to monitor for now.  If worsens, will consider echocardiogram of heart  Pancytopenia Likely secondary to myelosuppression from Velcade    -Resolved at this time  Thrush Improved at this time.  Continue fluconazole  to complete the course.   The patient understands the plans discussed today and is in agreement with them.  She knows to contact our office if she develops concerns prior to her next appointment.  30 minutes of total time was spent for this patient encounter, including preparation, face-to-face counseling with the patient and coordination of care, physical exam, and documentation of the encounter. > 50% of the time was spent on counseling as documented under my assessment and plan.   I, Marijo Sharps, acting as a Neurosurgeon for [employer], MD.,have documented all relevant documentation on the behalf of Mickiel Dry, MD,as directed by  Mickiel Dry, MD while in the presence of Mickiel Dry, MD.  I, Mickiel Dry MD, have reviewed the above documentation for accuracy and completeness, and I agree with the above.    Mickiel Dry, MD  [facility] [place] - A DEPT OF [facility] [address] [place] [address] Dept: [phone] Dept Fax: [phone]   No orders of the defined types were placed in this encounter.    ONCOLOGY HISTORY:   I have reviewed her chart and  materials related to her cancer extensively and collaborated history with the patient. Summary of oncologic history is as follows:   Diagnosis: Recurrent stage II IgA kappa multiple myeloma, high risk   -10/06/2017: T9-T10 laminectomy with resection of epidural tumor and T8-T11 posterior  fusion.  Pathology: Plasma cell neoplasm. IHC positive for CD138 and the plasma cells are kappa restricted by light chain in situ hybridization. -10/11/2017: Multiple Myeloma Panel.  SPEP: M spike: 2.1, IFE: IgA monoclonal protein with kappa light chain specificity, free kappa light chain: 12.2, free lambda light chain: 1.2, ratio: 1.69 -10/12/2017: Bone Marrow Biopsy.  Pathology: Plasma cell neoplasm. The marrow is hypercellular with increased monoclonal plasma cells (9% aspirate, 20% CD138) 10/12/2017: Bone Survey: There are findings consistent with multiple myeloma with lytic lesions noted in the skull and left femur. No other lytic lesions. -10/30/2017-11/10/2017: XRT to thoracic spine -11/14/2017 -02/19/2018: 5 cycles of RVD -11/21/2017-Current: Zometa  4mg  every 12 weeks -04/03/2018: Stem cell transplant -08/24/2018-09/26/2023: Revlimid  maintenance therapy 10 mg 3 weeks on/1 week off, discontinued due to progression -09/02/2019: PET: Redemonstrated bilateral cervical, right supraclavicular and right axillary hypermetabolic adenopathy with interval resolution of left axillary adenopathy. Persistent increased FDG uptake in both palatine tonsils. Similar lytic and sclerotic lesions in bones with no corresponding FDG uptake.  -09/26/2019: Myeloma Panel. SPEP and immunofixation negative. FLC ratio normal with elevated kappa light chains at 32.5.  -10/21/2019: Right neck lymph node biopsy negative for malignancy.  -09/29/2022: PET: Active myeloma involving the right posterior 10th rib. Focal hypermetabolism in the left mid femoral shaft may reflect additional site of myeloma -11/03/2022 to 11/16/2022: XRT of 25 Gray in 10 fractions to rib lesion and mid femur lesion completed -01/31/2023: Revlimid  dose reduced to 5 mg 3 weeks on/1 week off due to decreased tolerance (decrease in energy and appetite, and hair thinning) -08/24/2023:  PET: New tracer avid lucent bone lesions within the posterior aspect  of the left eleventh rib and right frontal bone. New lucent lesions within the right calvarial convexity and proximal shaft of left femur with mild increased tracer uptake. New foci mild increased uptake noted within the right pedicle of the L3 vertebra and right posterior iliac bone without underlying lucent bone lesions. -09/12/2023: Bone Marrow Biopsy.  -Pathology: Hypercellular bone marrow (30%) involved by a kappa restricted plasma cell neoplasm (30 to 40%) in the background of otherwise orderly trilineage hematopoiesis.  -FISH: T p53 mutation, gain of longer of chromosome 1 (1 q.), monosomy 13  -Cytogenetics: 17, XX [20] -09/28/2023: MRI Orbits: There is a circumscribed avidly enhancing mass within the orbital roof which extends into the retro conal orbit and impresses upon the superior rectus muscle. A separate circumscribed lesion is noted within the sphenoid wing anterolateral to the anterior clinoid. -10/02/2023-current: Daratumumab  VD,  Velcade  reduced to 1 mg/m on day 8 on 10/23/2023 due to increased weakness in patient -10/19/2023 -11/06/2023: XRT of 10 fractions to orbital lesion and left 11th rib  -11/26/2023-11/28/2023: Admitted to the hospital for fatigue and weakness   Current Treatment: Planned monthly daratumumab  and dexamethasone   INTERVAL HISTORY:   Lyanne D Asaro is here today for follow up. Patient is accompanied by her granddaughter today.    Since the last visit, Ms. Katha was admitted to the hospital for extreme weakness and fatigue.She had no evidence of sepsis at this time and had oral thrush.  She was discharged to rehab and currently is getting rehab at home  Icey has thrush in her mouth and reports limited appetite.  The thrush is improving and the pain is gradually subsiding. Other than the thrush, Dorris reports feeling well overall since the discharge.  I have reviewed the past medical history, past surgical history, social history and family history with the  patient and they are unchanged from previous note.  ALLERGIES:  has no known allergies.  MEDICATIONS:  Current Outpatient Medications  Medication Sig Dispense Refill   acetaminophen  (TYLENOL ) 500 MG tablet Take 1,000 mg by mouth at bedtime. (Patient taking differently: Take 1,000 mg by mouth every 4 (four) hours as needed.)     acyclovir  (ZOVIRAX ) 400 MG tablet Take 1 tablet (400 mg total) by mouth 2 (two) times daily. 60 tablet 3   diltiazem  (CARDIZEM  SR) 60 MG 12 hr capsule Take 60 mg by mouth 2 (two) times daily.     feeding supplement (ENSURE PLUS HIGH PROTEIN) LIQD Take 237 mLs by mouth 2 (two) times daily between meals.     fluconazole  (DIFLUCAN ) 100 MG tablet Take 100 mg by mouth daily.     LORazepam (ATIVAN) 0.5 MG tablet Take by mouth.     mirtazapine (REMERON) 7.5 MG tablet Take 7.5 mg by mouth at bedtime.     prochlorperazine  (COMPAZINE ) 10 MG tablet Take 1 tablet (10 mg total) by mouth every 6 (six) hours as needed. 60 tablet 2   calcium carbonate (OSCAL) 1500 (600 Ca) MG TABS tablet Take 600 mg of elemental calcium by mouth daily with breakfast. (Patient not taking: Reported on 01/05/2024)     clotrimazole  (MYCELEX ) 10 MG troche Take 1 tablet (10 mg total) by mouth 5 (five) times daily. X 5 days (Patient not taking: Reported on 01/05/2024)     dexamethasone  (DECADRON ) 4 MG tablet Take 1 tablet (4 mg total) by mouth once a week. Take 20 mg by mouth weekly the day after darzalex  injection (Patient not taking: Reported on 01/05/2024) 20 tablet 2   furosemide  (LASIX ) 40 MG tablet Take 1 tablet (40 mg total) by mouth daily as needed. (Patient not taking: Reported on 01/05/2024) 30 tablet 2   LOW-DOSE ASPIRIN PO Take 81 mg by mouth at bedtime. (Patient not taking: Reported on 01/05/2024)     Multiple Vitamins-Minerals (THERA-M) TABS Take 1 tablet by mouth daily. (Patient not taking: Reported on 01/05/2024)     vitamin B-12 (CYANOCOBALAMIN ) 1000 MCG tablet Take 1,000 mcg by mouth daily.  (Patient not taking: Reported on 01/05/2024)     No current facility-administered medications for this visit.    REVIEW OF SYSTEMS:   Constitutional: Denies fevers, chills or abnormal weight loss Eyes: Denies blurriness of vision Ears, nose, mouth, throat, and face: Denies mucositis or sore throat Respiratory: Denies cough, dyspnea or wheezes Cardiovascular: Denies palpitation, chest discomfort or lower extremity swelling Gastrointestinal:  Denies nausea, heartburn or change in bowel habits Skin: Denies abnormal skin rashes Lymphatics: Denies new lymphadenopathy or easy bruising Neurological:Denies numbness, tingling or new weaknesses Behavioral/Psych: Mood is stable, no new changes  All other systems were reviewed with the patient and are negative.   VITALS:  Blood pressure 126/74, pulse 100, temperature 98.3 F (36.8 C), temperature source Oral, resp. rate 18, SpO2 100%.  Wt Readings from Last 3 Encounters:  12/18/23 (P) 145 lb 2 oz (65.8 kg)  11/26/23 148 lb 5.9 oz (67.3 kg)  11/21/23 148 lb (67.1 kg)    There is no height or weight on file to calculate BMI.  Performance status (ECOG): 2 - Symptomatic, <50% confined to bed  PHYSICAL EXAM:  GENERAL:alert, no distress and comfortable SKIN: skin color, texture, turgor are normal, no rashes or significant lesions LYMPH:  no palpable lymphadenopathy in the cervical, axillary or inguinal LUNGS: clear to auscultation and percussion with normal breathing effort HEART: regular rate & rhythm and no murmurs and no lower extremity edema ABDOMEN:abdomen soft, non-tender and normal bowel sounds Musculoskeletal:no cyanosis of digits and no clubbing  NEURO: alert & oriented x 3 with fluent speech, no focal motor/sensory deficits  LABORATORY DATA:  I have reviewed the data as listed   Lab Results  Component Value Date   WBC 4.5 01/05/2024   NEUTROABS 2.6 01/05/2024   HGB 12.4 01/05/2024   HCT 38.4 01/05/2024   MCV 106.7 (H)  01/05/2024   PLT 212 01/05/2024      Chemistry      Component Value Date/Time   NA 139 01/05/2024 0848   K 4.8 01/05/2024 0848   CL 100 01/05/2024 0848   CO2 26 01/05/2024 0848   BUN 7 (L) 01/05/2024 0848   CREATININE 0.74 01/05/2024 0848      Component Value Date/Time   CALCIUM 10.1 01/05/2024 0848   ALKPHOS 56 01/05/2024 0848   AST 24 01/05/2024 0848   ALT 21 01/05/2024 0848   BILITOT 0.6 01/05/2024 0848      Latest Reference Range & Units 10/30/23 12:24  Total Protein ELP 6.0 - 8.5 g/dL 6.5  Albumin  ELP 2.9 - 4.4 g/dL 3.1  Globulin, Total 2.2 - 3.9 g/dL 3.4 (C)  A/G Ratio 0.7 - 1.7  0.9 (C)  Alpha-1-Globulin 0.0 - 0.4 g/dL 0.2  Joeyj-7-Honalopw 0.4 - 1.0 g/dL 1.1 (H)  Beta Globulin 0.7 - 1.3 g/dL 1.6 (H)  Gamma Globulin 0.4 - 1.8 g/dL 0.5  M-SPIKE, % Not Observed g/dL 0.7 (H)  SPE Interp.  Comment  Comment  Comment  (H): Data is abnormally high (C): Corrected   Latest Reference Range & Units 10/30/23 12:24  Kappa free light chain 3.3 - 19.4 mg/L 7.2  Lambda free light chains 5.7 - 26.3 mg/L 2.7 (L)  Kappa, lambda light chain ratio 0.26 - 1.65  2.67 (H)  (L): Data is abnormally low (H): Data is abnormally high  RADIOGRAPHIC STUDIES: I have personally reviewed the radiological images as listed and agreed with the findings in the report.  CT ABDOMEN PELVIS W CONTRAST CLINICAL DATA:  Diffuse abdominal pain.  EXAM: CT ABDOMEN AND PELVIS WITH CONTRAST  TECHNIQUE: Multidetector CT imaging of the abdomen and pelvis was performed using the standard protocol following bolus administration of intravenous contrast.  RADIATION DOSE REDUCTION: This exam was performed according to the departmental dose-optimization program which includes automated exposure control, adjustment of the mA and/or kV according to patient size and/or use of iterative reconstruction technique.  CONTRAST:  OMNIPAQUE  IOHEXOL  300 MG/ML  SOLN  COMPARISON:  August 24, 2023.  FINDINGS: Lower chest: No acute abnormality.  Hepatobiliary: No focal liver abnormality is seen. No gallstones, gallbladder wall thickening, or biliary dilatation.  Pancreas: Unremarkable. No pancreatic ductal dilatation or surrounding inflammatory changes.  Spleen: Normal in size without focal abnormality.  Adrenals/Urinary Tract: Adrenal glands appear normal. Right renal cysts are noted. No hydronephrosis or renal obstruction is noted. Urinary bladder is unremarkable.  Stomach/Bowel: Stomach is unremarkable. There is no evidence of bowel obstruction or inflammation. The appendix is not clearly visualized.  Vascular/Lymphatic: Aortic atherosclerosis. No enlarged abdominal or pelvic lymph nodes.  Reproductive: Uterus and bilateral adnexa are unremarkable.  Other: No pneumothorax or pleural  effusion is noted.  Musculoskeletal: Innumerable lytic lesions are noted throughout the visualized skeleton and spine consistent with history of multiple myeloma.  IMPRESSION: 1. Innumerable lytic lesions are noted throughout the visualized skeleton and spine consistent with history of multiple myeloma. 2. No acute abnormality seen in the abdomen or pelvis. 3. Aortic atherosclerosis.  Aortic Atherosclerosis (ICD10-I70.0).  Electronically Signed   By: Lynwood Landy Raddle M.D.   On: 11/26/2023 13:21 DG Chest Port 1 View CLINICAL DATA:  Weakness.  Multiple myeloma.  EXAM: PORTABLE CHEST 1 VIEW  COMPARISON:  11/08/2023  FINDINGS: The heart size and mediastinal contours are within normal limits. Both lungs are clear. Sclerotic bone lesion again seen involving the left posterior 4th rib. Lower thoracic spine fusion hardware again noted.  IMPRESSION: No active cardiopulmonary disease.  Stable sclerotic bone lesion involving the left posterior 4th rib.  Electronically Signed   By: Norleen DELENA Kil M.D.   On: 11/26/2023 11:33

## 2024-01-05 NOTE — Progress Notes (Signed)
 DISCONTINUE ON PATHWAY REGIMEN - Multiple Myeloma and Other Plasma Cell Dyscrasias     Cycles 1 through 3: A cycle is every 21 days:     Dexamethasone       Bortezomib       Daratumumab  and hyaluronidase -fihj    Cycles 4 through 8: A cycle is every 21 days:     Dexamethasone       Bortezomib       Daratumumab  and hyaluronidase -fihj    Cycles 9 and beyond: A cycle is every 28 days:     Daratumumab  and hyaluronidase -fihj   **Always confirm dose/schedule in your pharmacy ordering system**  PRIOR TREATMENT: [id number]: DaraVd - Subcutaneous Daratumumab  (Daratumumab /hyaluronidase  SUBQ + Bortezomib  1.3 mg/m2 SUBQ D1, 4, 8, 11 + Dexamethasone  20 mg) Until Progression or  Unacceptable Toxicity  START ON PATHWAY REGIMEN - Multiple Myeloma and Other Plasma Cell Dyscrasias     Cycles 1 and 2: A cycle is every 28 days:     Daratumumab  and hyaluronidase -fihj    Cycles 3 through 6: A cycle is every 28 days:     Daratumumab  and hyaluronidase -fihj    Cycles 7 and beyond: A cycle is every 28 days:     Daratumumab  and hyaluronidase -fihj   **Always confirm dose/schedule in your pharmacy ordering system**  Patient Characteristics: Multiple Myeloma, Relapsed / Refractory, Second through Fourth Lines of Therapy, Not a Candidate for CAR T-cell Therapy, Frail or Not a Candidate for Triplet Therapy Disease Classification: Multiple Myeloma Therapeutic Status: Relapsed R2-ISS Staging: II Line of Therapy: Second Line Intent of Therapy: Non-Curative / Palliative Intent, Discussed with Patient

## 2024-01-07 ENCOUNTER — Other Ambulatory Visit: Payer: Self-pay

## 2024-01-10 ENCOUNTER — Encounter (HOSPITAL_COMMUNITY): Payer: Self-pay

## 2024-01-10 ENCOUNTER — Other Ambulatory Visit: Payer: Self-pay

## 2024-01-10 ENCOUNTER — Emergency Department (HOSPITAL_COMMUNITY)
Admission: EM | Admit: 2024-01-10 | Discharge: 2024-01-10 | Disposition: A | Discharge status: Home / Self Care | Attending: Emergency Medicine | Admitting: Emergency Medicine

## 2024-01-10 ENCOUNTER — Emergency Department (HOSPITAL_COMMUNITY)

## 2024-01-10 DIAGNOSIS — I119 Hypertensive heart disease without heart failure: Secondary | ICD-10-CM | POA: Diagnosis not present

## 2024-01-10 DIAGNOSIS — R110 Nausea: Secondary | ICD-10-CM | POA: Diagnosis not present

## 2024-01-10 DIAGNOSIS — I10 Essential (primary) hypertension: Secondary | ICD-10-CM | POA: Diagnosis not present

## 2024-01-10 DIAGNOSIS — G629 Polyneuropathy, unspecified: Secondary | ICD-10-CM | POA: Diagnosis not present

## 2024-01-10 DIAGNOSIS — D61818 Other pancytopenia: Secondary | ICD-10-CM | POA: Diagnosis not present

## 2024-01-10 DIAGNOSIS — M4804 Spinal stenosis, thoracic region: Secondary | ICD-10-CM | POA: Diagnosis not present

## 2024-01-10 DIAGNOSIS — E871 Hypo-osmolality and hyponatremia: Secondary | ICD-10-CM | POA: Diagnosis not present

## 2024-01-10 DIAGNOSIS — R55 Syncope and collapse: Secondary | ICD-10-CM | POA: Insufficient documentation

## 2024-01-10 DIAGNOSIS — R531 Weakness: Secondary | ICD-10-CM | POA: Diagnosis not present

## 2024-01-10 DIAGNOSIS — R1310 Dysphagia, unspecified: Secondary | ICD-10-CM | POA: Diagnosis not present

## 2024-01-10 DIAGNOSIS — E441 Mild protein-calorie malnutrition: Secondary | ICD-10-CM | POA: Diagnosis not present

## 2024-01-10 DIAGNOSIS — D696 Thrombocytopenia, unspecified: Secondary | ICD-10-CM | POA: Diagnosis not present

## 2024-01-10 DIAGNOSIS — M25552 Pain in left hip: Secondary | ICD-10-CM | POA: Diagnosis not present

## 2024-01-10 DIAGNOSIS — R519 Headache, unspecified: Secondary | ICD-10-CM | POA: Diagnosis present

## 2024-01-10 DIAGNOSIS — G459 Transient cerebral ischemic attack, unspecified: Secondary | ICD-10-CM | POA: Diagnosis not present

## 2024-01-10 DIAGNOSIS — Z8579 Personal history of other malignant neoplasms of lymphoid, hematopoietic and related tissues: Secondary | ICD-10-CM | POA: Insufficient documentation

## 2024-01-10 DIAGNOSIS — C9000 Multiple myeloma not having achieved remission: Secondary | ICD-10-CM | POA: Diagnosis not present

## 2024-01-10 DIAGNOSIS — I350 Nonrheumatic aortic (valve) stenosis: Secondary | ICD-10-CM | POA: Diagnosis not present

## 2024-01-10 DIAGNOSIS — R42 Dizziness and giddiness: Secondary | ICD-10-CM | POA: Diagnosis not present

## 2024-01-10 LAB — URINALYSIS, ROUTINE W REFLEX MICROSCOPIC
Bacteria, UA: NONE SEEN
Bilirubin Urine: NEGATIVE
Glucose, UA: NEGATIVE mg/dL
Hgb urine dipstick: NEGATIVE
Ketones, ur: NEGATIVE mg/dL
Nitrite: NEGATIVE
Protein, ur: NEGATIVE mg/dL
Specific Gravity, Urine: 1.006 (ref 1.005–1.030)
pH: 6 (ref 5.0–8.0)

## 2024-01-10 LAB — COMPREHENSIVE METABOLIC PANEL WITH GFR
ALT: 21 U/L (ref 0–44)
AST: 21 U/L (ref 15–41)
Albumin: 4.3 g/dL (ref 3.5–5.0)
Alkaline Phosphatase: 55 U/L (ref 38–126)
Anion gap: 12 (ref 5–15)
BUN: 6 mg/dL — ABNORMAL LOW (ref 8–23)
CO2: 23 mmol/L (ref 22–32)
Calcium: 9.9 mg/dL (ref 8.9–10.3)
Chloride: 99 mmol/L (ref 98–111)
Creatinine, Ser: 0.62 mg/dL (ref 0.44–1.00)
GFR, Estimated: >60 mL/min (ref >60)
Glucose, Bld: 98 mg/dL (ref 70–99)
Potassium: 4.9 mmol/L (ref 3.5–5.1)
Sodium: 134 mmol/L — ABNORMAL LOW (ref 135–145)
Total Bilirubin: 0.7 mg/dL (ref 0.0–1.2)
Total Protein: 6.9 g/dL (ref 6.5–8.1)

## 2024-01-10 LAB — CBC
HCT: 42.6 % (ref 36.0–46.0)
Hemoglobin: 13.8 g/dL (ref 12.0–15.0)
MCH: 34.2 pg — ABNORMAL HIGH (ref 26.0–34.0)
MCHC: 32.4 g/dL (ref 30.0–36.0)
MCV: 105.4 fL — ABNORMAL HIGH (ref 80.0–100.0)
Platelets: 186 K/uL (ref 150–400)
RBC: 4.04 MIL/uL (ref 3.87–5.11)
RDW: 14.9 % (ref 11.5–15.5)
WBC: 4 K/uL (ref 4.0–10.5)
nRBC: 0 % (ref 0.0–0.2)

## 2024-01-10 LAB — CBG MONITORING, ED: Glucose-Capillary: 122 mg/dL — ABNORMAL HIGH (ref 70–99)

## 2024-01-10 LAB — PROTIME-INR
INR: 1 (ref 0.8–1.2)
Prothrombin Time: 13.6 s (ref 11.4–15.2)

## 2024-01-10 MED ORDER — SODIUM CHLORIDE 0.9 % IV BOLUS
1000.0000 mL | Freq: Once | INTRAVENOUS | Status: AC
  Administered 2024-01-10: 1000 mL via INTRAVENOUS

## 2024-01-10 MED ORDER — ACETAMINOPHEN 500 MG PO TABS
1000.0000 mg | ORAL_TABLET | Freq: Once | ORAL | Status: AC
  Administered 2024-01-10: 1000 mg via ORAL
  Filled 2024-01-10: qty 2

## 2024-01-10 NOTE — ED Provider Notes (Signed)
 [place] [facility] DE[facility]MENT AT [facility] Provider Note   CSN: [id number] Arrival date & time: 01/10/24  1238     Patient presents with: Near Syncope   Jamie Ewing is a 83 y.o. female with a history including multiple myeloma who is scheduled to restart treatments next week, hypercholesterolemia and hypertension, also with generalized weakness secondary to inactivity associated with left severe hip arthritis was undergoing home PT today when she had a near syncopal event.  She was ambulating in the kitchen with the help of her therapist when she started to become lightheaded, she was walked back to a chair, sat down and noted that her vision was blurry, she was asked multiple questions and she states she could not think of the words that she wanted to respond to them with but was unable to respond.  Family member who witnessed the event stated that this episode lasted for about 15 minutes.  Patient denies full syncope, she also denies chest pain, palpitations, nausea or vomiting.  She states that her blood pressure was checked during the episode and it was found to be high in the 170 systolic range.  She does endorse having a headache and some nausea since the event.  She feels like she is back to her baseline except for headache.  {Add pertinent medical, surgical, social history, OB history to YEP:67052} The history is provided by the patient and a relative.       Prior to Admission medications   Medication Sig Start Date End Date Taking? Authorizing Provider  acetaminophen  (TYLENOL ) 500 MG tablet Take 1,000 mg by mouth at bedtime. Patient taking differently: Take 1,000 mg by mouth every 4 (four) hours as needed. 10/21/19  Yes [provider]  acyclovir  (ZOVIRAX ) 400 MG tablet Take 1 tablet (400 mg total) by mouth 2 (two) times daily. 12/22/23  Yes Geofm Delon BRAVO, NP  feeding supplement (ENSURE PLUS HIGH PROTEIN) LIQD Take 237 mLs by mouth 2 (two) times daily  between meals. 11/28/23  Yes Tat, Alm, MD  fluconazole  (DIFLUCAN ) 100 MG tablet Take 100 mg by mouth daily. 01/03/24  Yes [provider]  LORazepam (ATIVAN) 0.5 MG tablet Take by mouth. 01/03/24  Yes [provider]  LOW-DOSE ASPIRIN PO Take 81 mg by mouth at bedtime.   Yes [provider]  mirtazapine (REMERON) 7.5 MG tablet Take 7.5 mg by mouth at bedtime. 01/03/24  Yes [provider]  potassium chloride  SA (KLOR-CON  M) 20 MEQ tablet Take 20 mEq by mouth 2 (two) times daily.   Yes [provider]  prochlorperazine  (COMPAZINE ) 10 MG tablet Take 1 tablet (10 mg total) by mouth every 6 (six) hours as needed. 09/26/23  Yes Rogers Hai, MD    Allergies: Patient has no known allergies.    Review of Systems  Constitutional:  Negative for fever.  HENT: Negative.    Eyes:  Positive for visual disturbance.  Respiratory:  Negative for chest tightness and shortness of breath.   Cardiovascular:  Negative for chest pain and palpitations.  Gastrointestinal:  Positive for nausea. Negative for abdominal pain and vomiting.  Genitourinary: Negative.   Musculoskeletal:  Negative for arthralgias, joint swelling and neck pain.  Skin: Negative.  Negative for rash and wound.  Neurological:  Positive for speech difficulty and light-headedness. Negative for dizziness, weakness, numbness and headaches.  Psychiatric/Behavioral: Negative.      Updated Vital Signs BP (!) 148/83   Pulse 91   Temp 97.9 F (36.6  C) (Oral)   Resp 18   Ht 5' 3 (1.6 m)   Wt 65.8 kg   SpO2 99%   BMI 25.70 kg/m   Physical Exam Vitals and nursing note reviewed.  Constitutional:      Appearance: She is well-developed.  HENT:     Head: Normocephalic and atraumatic.  Eyes:     Conjunctiva/sclera: Conjunctivae normal.  Cardiovascular:     Rate and Rhythm: Normal rate and regular rhythm.     Heart sounds: Normal heart sounds.  Pulmonary:     Effort: Pulmonary effort is  normal.     Breath sounds: Normal breath sounds. No wheezing.  Abdominal:     General: Bowel sounds are normal.     Palpations: Abdomen is soft.     Tenderness: There is no abdominal tenderness.  Musculoskeletal:        General: Normal range of motion.     Cervical back: Normal range of motion.  Skin:    General: Skin is warm and dry.  Neurological:     General: No focal deficit present.     Mental Status: She is alert and oriented to person, place, and time.     Cranial Nerves: No cranial nerve deficit, dysarthria or facial asymmetry.     Sensory: Sensation is intact.     Motor: Motor function is intact. No abnormal muscle tone or pronator drift.     Coordination: Finger-Nose-Finger Test normal. Rapid alternating movements normal.     Comments: Patient unable to perform heel shin exam secondary to her chronic lower extremity weakness     (all labs ordered are listed, but only abnormal results are displayed) Labs Reviewed  COMPREHENSIVE METABOLIC PANEL WITH GFR - Abnormal; Notable for the following components:      Result Value   Sodium 134 (*)    BUN 6 (*)    All other components within normal limits  CBC - Abnormal; Notable for the following components:   MCV 105.4 (*)    MCH 34.2 (*)    All other components within normal limits  URINALYSIS, ROUTINE W REFLEX MICROSCOPIC  PROTIME-INR  CBG MONITORING, ED    EKG: None  Radiology: CT Head Wo Contrast Result Date: 01/10/2024 EXAM: CT HEAD WITHOUT CONTRAST 01/10/2024 03:02:51 PM TECHNIQUE: CT of the head was performed without the administration of intravenous contrast. Automated exposure control, iterative reconstruction, and/or weight based adjustment of the mA/kV was utilized to reduce the radiation dose to as low as reasonably achievable. COMPARISON: MRI 09/28/23 and 10/06/22. CLINICAL HISTORY: Headache, neuro deficit. PT for L hip pain and weakness when she started to get dizzy, denies falling and was helped to sitting  position, denies head injury. Per ems pt was confused after dizzy spell and states that she couldn't get words out. Pt also endorses nausea. Pt AAOx4 ; in during triage VSS. CBG 130 per ems. Pt has hx of multiple myeloma, not currently on chemo. FINDINGS: BRAIN AND VENTRICLES: No acute hemorrhage. No evidence of acute infarct. No hydrocephalus. No extra-axial collection. No mass effect or midline shift. Mild generalized cerebral and cerebellar volume loss. Subcortical and periventricular white matter changes, likely sequela of chronic microvascular ischemia. ORBITS: Bilateral lens replacement. SINUSES: No acute abnormality. SOFT TISSUES AND SKULL: No acute soft tissue abnormality. Lytic lesions in calvarium. IMPRESSION: 1. No acute intracranial abnormality. 2. Prominent lytic lesion involving the right orbital roof extending partially into the right frontal sinus corresponding to the enhancing lesion on MRI of the  brain from 09/28/23, likely related to multiple myeloma. 3. Numerous additional lytic lesions in the calvarium, likely also related to myeloma. 4. Mild generalized volume loss and mild chronic microvascular ischemic changes. Electronically signed by: Donnice Mania MD 01/10/2024 03:22 PM EDT RP Workstation: [id number]    {Document cardiac monitor, telemetry assessment procedure when appropriate:32947} Procedures   Medications Ordered in the ED  sodium chloride  0.9 % bolus 1,000 mL (has no administration in time range)  acetaminophen  (TYLENOL ) tablet 1,000 mg (1,000 mg Oral Given 01/10/24 1435)    Clinical Course as of 01/10/24 1637  Wed Jan 10, 2024  1635 Dg now at bedside and adds that pt had a severe case of thrush which has now been treated but pt continues to minimize PO intake since including fluids and feels she is probably just dehydrated as reason for todays events. She is also very resistant to her PT which may have exacerbated todays events.  Orthostatics ordered.   IVF ordered.    PT headache now resolved after tylenol  [JI]    Clinical Course User Index [JI] Birdena Clarity, PA-C   {Click here for ABCD2, HEART and other calculators REFRESH Note before signing:1}                              Medical Decision Making Amount and/or Complexity of Data Reviewed Labs: ordered. Radiology: ordered.  Risk OTC drugs.     {Document critical care time when appropriate  Document review of labs and clinical decision tools ie CHADS2VASC2, etc  Document your independent review of radiology images and any outside records  Document your discussion with family members, caretakers and with consultants  Document social determinants of health affecting pt's care  Document your decision making why or why not admission, treatments were needed:32947:::1}   Final diagnoses:  None    ED Discharge Orders     None

## 2024-01-10 NOTE — ED Triage Notes (Addendum)
 Pt bib ems from home, pt was being seen by PT for L hip pain and weakness when she started to get dizzy, denies falling and was helped to sitting position, denies head injury. Per ems pt was confused after dizzy spell and states that she couldn't get words out. Pt also endorses nausea. Pt AAOx4 in during triage VSS. CBG 130 per ems. Pt has hx of multiple myeloma, not currently on chemo

## 2024-01-11 DIAGNOSIS — D696 Thrombocytopenia, unspecified: Secondary | ICD-10-CM | POA: Diagnosis not present

## 2024-01-11 DIAGNOSIS — E441 Mild protein-calorie malnutrition: Secondary | ICD-10-CM | POA: Diagnosis not present

## 2024-01-11 DIAGNOSIS — I119 Hypertensive heart disease without heart failure: Secondary | ICD-10-CM | POA: Diagnosis not present

## 2024-01-11 DIAGNOSIS — R1310 Dysphagia, unspecified: Secondary | ICD-10-CM | POA: Diagnosis not present

## 2024-01-11 DIAGNOSIS — I350 Nonrheumatic aortic (valve) stenosis: Secondary | ICD-10-CM | POA: Diagnosis not present

## 2024-01-11 DIAGNOSIS — G629 Polyneuropathy, unspecified: Secondary | ICD-10-CM | POA: Diagnosis not present

## 2024-01-11 DIAGNOSIS — M4804 Spinal stenosis, thoracic region: Secondary | ICD-10-CM | POA: Diagnosis not present

## 2024-01-11 DIAGNOSIS — D61818 Other pancytopenia: Secondary | ICD-10-CM | POA: Diagnosis not present

## 2024-01-11 DIAGNOSIS — C9000 Multiple myeloma not having achieved remission: Secondary | ICD-10-CM | POA: Diagnosis not present

## 2024-01-12 DIAGNOSIS — M4804 Spinal stenosis, thoracic region: Secondary | ICD-10-CM | POA: Diagnosis not present

## 2024-01-12 DIAGNOSIS — C9000 Multiple myeloma not having achieved remission: Secondary | ICD-10-CM | POA: Diagnosis not present

## 2024-01-12 DIAGNOSIS — I119 Hypertensive heart disease without heart failure: Secondary | ICD-10-CM | POA: Diagnosis not present

## 2024-01-12 DIAGNOSIS — D696 Thrombocytopenia, unspecified: Secondary | ICD-10-CM | POA: Diagnosis not present

## 2024-01-12 DIAGNOSIS — R1310 Dysphagia, unspecified: Secondary | ICD-10-CM | POA: Diagnosis not present

## 2024-01-12 DIAGNOSIS — G629 Polyneuropathy, unspecified: Secondary | ICD-10-CM | POA: Diagnosis not present

## 2024-01-12 DIAGNOSIS — I350 Nonrheumatic aortic (valve) stenosis: Secondary | ICD-10-CM | POA: Diagnosis not present

## 2024-01-12 DIAGNOSIS — E441 Mild protein-calorie malnutrition: Secondary | ICD-10-CM | POA: Diagnosis not present

## 2024-01-12 DIAGNOSIS — D61818 Other pancytopenia: Secondary | ICD-10-CM | POA: Diagnosis not present

## 2024-01-17 DIAGNOSIS — I119 Hypertensive heart disease without heart failure: Secondary | ICD-10-CM | POA: Diagnosis not present

## 2024-01-17 DIAGNOSIS — E876 Hypokalemia: Secondary | ICD-10-CM | POA: Diagnosis not present

## 2024-01-17 DIAGNOSIS — E441 Mild protein-calorie malnutrition: Secondary | ICD-10-CM | POA: Diagnosis not present

## 2024-01-17 DIAGNOSIS — C9000 Multiple myeloma not having achieved remission: Secondary | ICD-10-CM | POA: Diagnosis not present

## 2024-01-17 DIAGNOSIS — G629 Polyneuropathy, unspecified: Secondary | ICD-10-CM | POA: Diagnosis not present

## 2024-01-17 DIAGNOSIS — D696 Thrombocytopenia, unspecified: Secondary | ICD-10-CM | POA: Diagnosis not present

## 2024-01-17 DIAGNOSIS — R1310 Dysphagia, unspecified: Secondary | ICD-10-CM | POA: Diagnosis not present

## 2024-01-17 DIAGNOSIS — M4804 Spinal stenosis, thoracic region: Secondary | ICD-10-CM | POA: Diagnosis not present

## 2024-01-17 DIAGNOSIS — D61818 Other pancytopenia: Secondary | ICD-10-CM | POA: Diagnosis not present

## 2024-01-17 DIAGNOSIS — I350 Nonrheumatic aortic (valve) stenosis: Secondary | ICD-10-CM | POA: Diagnosis not present

## 2024-01-18 DIAGNOSIS — C9002 Multiple myeloma in relapse: Secondary | ICD-10-CM | POA: Diagnosis not present

## 2024-01-18 DIAGNOSIS — C9001 Multiple myeloma in remission: Secondary | ICD-10-CM | POA: Diagnosis not present

## 2024-01-18 DIAGNOSIS — Z9484 Stem cells transplant status: Secondary | ICD-10-CM | POA: Diagnosis not present

## 2024-01-18 DIAGNOSIS — E639 Nutritional deficiency, unspecified: Secondary | ICD-10-CM | POA: Diagnosis not present

## 2024-01-20 ENCOUNTER — Other Ambulatory Visit: Payer: Self-pay

## 2024-01-23 ENCOUNTER — Inpatient Hospital Stay

## 2024-01-23 NOTE — Progress Notes (Signed)
 Pharmacist Chemotherapy Monitoring - Initial Assessment    Anticipated start date: 01/29/24   The following has been reviewed per standard work regarding the patient's treatment regimen: The patient's diagnosis, treatment plan and drug doses, and organ/hematologic function Lab orders and baseline tests specific to treatment regimen  The treatment plan start date, drug sequencing, and pre-medications Prior authorization status  Patient's documented medication list, including drug-drug interaction screen and prescriptions for anti-emetics and supportive care specific to the treatment regimen The drug concentrations, fluid compatibility, administration routes, and timing of the medications to be used The patient's access for treatment and lifetime cumulative dose history, if applicable  The patient's medication allergies and previous infusion related reactions, if applicable   Changes made to treatment plan:  N/A  Follow up needed:  N/A  Rechallenge with Darzalex  Faspro - does not need T&S and Phenotype.    Niels FORBES Molt, [facility], 01/23/2024  11:21 AM

## 2024-01-29 ENCOUNTER — Inpatient Hospital Stay: Attending: Hematology

## 2024-01-29 ENCOUNTER — Inpatient Hospital Stay: Admitting: Dietician

## 2024-01-29 ENCOUNTER — Inpatient Hospital Stay

## 2024-01-29 VITALS — BP 139/71 | HR 99 | Temp 96.6°F | Resp 20 | Wt 136.9 lb

## 2024-01-29 DIAGNOSIS — Z79899 Other long term (current) drug therapy: Secondary | ICD-10-CM | POA: Diagnosis not present

## 2024-01-29 DIAGNOSIS — C7951 Secondary malignant neoplasm of bone: Secondary | ICD-10-CM | POA: Diagnosis not present

## 2024-01-29 DIAGNOSIS — C9000 Multiple myeloma not having achieved remission: Secondary | ICD-10-CM

## 2024-01-29 DIAGNOSIS — Z5112 Encounter for antineoplastic immunotherapy: Secondary | ICD-10-CM | POA: Diagnosis not present

## 2024-01-29 LAB — CBC WITH DIFFERENTIAL/PLATELET
Abs Immature Granulocytes: 0.01 K/uL (ref 0.00–0.07)
Basophils Absolute: 0 K/uL (ref 0.0–0.1)
Basophils Relative: 0 %
Eosinophils Absolute: 0 K/uL (ref 0.0–0.5)
Eosinophils Relative: 1 %
HCT: 39 % (ref 36.0–46.0)
Hemoglobin: 13.1 g/dL (ref 12.0–15.0)
Immature Granulocytes: 0 %
Lymphocytes Relative: 15 %
Lymphs Abs: 0.7 K/uL (ref 0.7–4.0)
MCH: 35.5 pg — ABNORMAL HIGH (ref 26.0–34.0)
MCHC: 33.6 g/dL (ref 30.0–36.0)
MCV: 105.7 fL — ABNORMAL HIGH (ref 80.0–100.0)
Monocytes Absolute: 0.6 K/uL (ref 0.1–1.0)
Monocytes Relative: 13 %
Neutro Abs: 3.2 K/uL (ref 1.7–7.7)
Neutrophils Relative %: 71 %
Platelets: 221 K/uL (ref 150–400)
RBC: 3.69 MIL/uL — ABNORMAL LOW (ref 3.87–5.11)
RDW: 14 % (ref 11.5–15.5)
WBC: 4.5 K/uL (ref 4.0–10.5)
nRBC: 0 % (ref 0.0–0.2)

## 2024-01-29 MED ORDER — DEXAMETHASONE 4 MG PO TABS
20.0000 mg | ORAL_TABLET | Freq: Once | ORAL | Status: AC
  Administered 2024-01-29: 20 mg via ORAL
  Filled 2024-01-29: qty 5

## 2024-01-29 MED ORDER — ACYCLOVIR 400 MG PO TABS: 400.0000 mg | ORAL_TABLET | Freq: Two times a day (BID) | ORAL | 11 refills | Status: AC

## 2024-01-29 MED ORDER — ACETAMINOPHEN 325 MG PO TABS
650.0000 mg | ORAL_TABLET | Freq: Once | ORAL | Status: AC
  Administered 2024-01-29: 650 mg via ORAL
  Filled 2024-01-29: qty 2

## 2024-01-29 MED ORDER — NYSTATIN 100000 UNIT/ML MT SUSP: 5.0000 mL | Freq: Four times a day (QID) | OROMUCOSAL | 1 refills | Status: AC

## 2024-01-29 MED ORDER — MONTELUKAST SODIUM 10 MG PO TABS
10.0000 mg | ORAL_TABLET | Freq: Once | ORAL | Status: AC
  Administered 2024-01-29: 10 mg via ORAL
  Filled 2024-01-29: qty 1

## 2024-01-29 MED ORDER — DEXAMETHASONE 4 MG PO TABS: 4.0000 mg | ORAL_TABLET | Freq: Every day | ORAL | 4 refills | Status: AC

## 2024-01-29 MED ORDER — CETIRIZINE HCL 10 MG PO TABS
10.0000 mg | ORAL_TABLET | Freq: Once | ORAL | Status: AC
  Administered 2024-01-29: 10 mg via ORAL
  Filled 2024-01-29: qty 1

## 2024-01-29 MED ORDER — DIPHENHYDRAMINE HCL 25 MG PO CAPS: 50.0000 mg | ORAL_CAPSULE | Freq: Once | ORAL | Status: DC

## 2024-01-29 MED ORDER — ONDANSETRON HCL 8 MG PO TABS: 8.0000 mg | ORAL_TABLET | Freq: Three times a day (TID) | ORAL | 1 refills | Status: AC | PRN

## 2024-01-29 MED ORDER — DARATUMUMAB-HYALURONIDASE-FIHJ 1800-30000 MG-UT/15ML ~~LOC~~ SOLN
1800.0000 mg | Freq: Once | SUBCUTANEOUS | Status: AC
  Administered 2024-01-29: 1800 mg via SUBCUTANEOUS
  Filled 2024-01-29: qty 15

## 2024-01-29 MED ORDER — PROCHLORPERAZINE MALEATE 10 MG PO TABS: 10.0000 mg | ORAL_TABLET | Freq: Four times a day (QID) | ORAL | 1 refills | Status: AC | PRN

## 2024-01-29 MED ORDER — LIDOCAINE-PRILOCAINE 2.5-2.5 % EX CREA: TOPICAL_CREAM | CUTANEOUS | 3 refills | Status: AC

## 2024-01-29 NOTE — Progress Notes (Signed)
 DC diphenhydramine  as premedication for Darzalex  Faspro.  Add Cetirizine  10 mg po x 1 as premedication.  V.O. Dr Ivery Molt, PharmD

## 2024-01-29 NOTE — Patient Instructions (Signed)
 [facility] [place] - A DEPT OF [facility]  Discharge Instructions: Thank you for choosing [facility] to provide your oncology and hematology care.  If you have a lab appointment with the Cancer Center - please note that after April 8th, 2024, all labs will be drawn in the cancer center.  You do not have to check in or register with the main entrance as you have in the past but will complete your check-in in the cancer center.  Wear comfortable clothing and clothing appropriate for easy access to any Portacath or PICC line.   We strive to give you quality time with your provider. You may need to reschedule your appointment if you arrive late (15 or more minutes).  Arriving late affects you and other patients whose appointments are after yours.  Also, if you miss three or more appointments without notifying the office, you may be dismissed from the clinic at the provider's discretion.      For prescription refill requests, have your pharmacy contact our office and allow 72 hours for refills to be completed.    Today you received the following chemotherapy and/or immunotherapy agents daratumumab.       To help prevent nausea and vomiting after your treatment, we encourage you to take your nausea medication as directed.  BELOW ARE SYMPTOMS THAT SHOULD BE REPORTED IMMEDIATELY: *FEVER GREATER THAN 100.4 F (38 C) OR HIGHER *CHILLS OR SWEATING *NAUSEA AND VOMITING THAT IS NOT CONTROLLED WITH YOUR NAUSEA MEDICATION *UNUSUAL SHORTNESS OF BREATH *UNUSUAL BRUISING OR BLEEDING *URINARY PROBLEMS (pain or burning when urinating, or frequent urination) *BOWEL PROBLEMS (unusual diarrhea, constipation, pain near the anus) TENDERNESS IN MOUTH AND THROAT WITH OR WITHOUT PRESENCE OF ULCERS (sore throat, sores in mouth, or a toothache) UNUSUAL RASH, SWELLING OR PAIN  UNUSUAL VAGINAL DISCHARGE OR ITCHING   Items with * indicate a potential emergency and should be  followed up as soon as possible or go to the Emergency Department if any problems should occur.  Please show the CHEMOTHERAPY ALERT CARD or IMMUNOTHERAPY ALERT CARD at check-in to the Emergency Department and triage nurse.  Should you have questions after your visit or need to cancel or reschedule your appointment, please contact [facility] [place] - A DEPT OF [facility] [phone]  and follow the prompts.  Office hours are 8:00 a.m. to 4:30 p.m. Monday - Friday. Please note that voicemails left after 4:00 p.m. may not be returned until the following business day.  We are closed weekends and major holidays. You have access to a nurse at all times for urgent questions. Please call the main number to the clinic [phone] and follow the prompts.  For any non-urgent questions, you may also contact your provider using MyChart. We now offer e-Visits for anyone 52 and older to request care online for non-urgent symptoms. For details visit [email or web address].   Also download the MyChart app! Go to the app store, search "MyChart", open the app, select , and log in with your MyChart username and password.

## 2024-01-29 NOTE — Progress Notes (Signed)
Patient tolerated Daratumumab injection with no complaints voiced.  See MAR for details.  Labs reviewed. Injection site clean and dry with no bruising or swelling noted at site.  Band aid applied.  Vss with discharge and left in satisfactory condition with no s/s of distress noted.

## 2024-01-29 NOTE — Progress Notes (Signed)
 Planning to see patient during infusion for nutrition follow-up. Per infusion RN, patient request to cancel nutrition today. She has already talked with nutrition at [facility].

## 2024-01-29 NOTE — Progress Notes (Signed)
 weight is 136 today, 145 lbs 2 weeks ago, no appetitie, still has thrush on her tongue, has had 3 scripts for thrush, not eating much, strength improved some, has diarrhea when eats or drinks ensure, she has no taste. MD notified.   Ok to treat today per Dr. Davonna.

## 2024-01-30 LAB — KAPPA/LAMBDA LIGHT CHAINS
Kappa free light chain: 6.4 mg/L (ref 3.3–19.4)
Kappa, lambda light chain ratio: 0.89 (ref 0.26–1.65)
Lambda free light chains: 7.2 mg/L (ref 5.7–26.3)

## 2024-02-02 LAB — MULTIPLE MYELOMA PANEL, SERUM
Albumin SerPl Elph-Mcnc: 3.9 g/dL (ref 2.9–4.4)
Albumin/Glob SerPl: 1.4 (ref 0.7–1.7)
Alpha 1: 0.3 g/dL (ref 0.0–0.4)
Alpha2 Glob SerPl Elph-Mcnc: 1.1 g/dL — ABNORMAL HIGH (ref 0.4–1.0)
B-Globulin SerPl Elph-Mcnc: 1.1 g/dL (ref 0.7–1.3)
Gamma Glob SerPl Elph-Mcnc: 0.5 g/dL (ref 0.4–1.8)
Globulin, Total: 2.9 g/dL (ref 2.2–3.9)
IgA: 152 mg/dL (ref 64–422)
IgG (Immunoglobin G), Serum: 575 mg/dL — ABNORMAL LOW (ref 586–1602)
IgM (Immunoglobulin M), Srm: 5 mg/dL — ABNORMAL LOW (ref 26–217)
Total Protein ELP: 6.8 g/dL (ref 6.0–8.5)

## 2024-02-05 DIAGNOSIS — C9000 Multiple myeloma not having achieved remission: Secondary | ICD-10-CM | POA: Diagnosis not present

## 2024-02-05 DIAGNOSIS — G629 Polyneuropathy, unspecified: Secondary | ICD-10-CM | POA: Diagnosis not present

## 2024-02-05 DIAGNOSIS — I350 Nonrheumatic aortic (valve) stenosis: Secondary | ICD-10-CM | POA: Diagnosis not present

## 2024-02-05 DIAGNOSIS — M4804 Spinal stenosis, thoracic region: Secondary | ICD-10-CM | POA: Diagnosis not present

## 2024-02-05 DIAGNOSIS — D696 Thrombocytopenia, unspecified: Secondary | ICD-10-CM | POA: Diagnosis not present

## 2024-02-05 DIAGNOSIS — R1310 Dysphagia, unspecified: Secondary | ICD-10-CM | POA: Diagnosis not present

## 2024-02-05 DIAGNOSIS — I119 Hypertensive heart disease without heart failure: Secondary | ICD-10-CM | POA: Diagnosis not present

## 2024-02-05 DIAGNOSIS — D61818 Other pancytopenia: Secondary | ICD-10-CM | POA: Diagnosis not present

## 2024-02-07 ENCOUNTER — Other Ambulatory Visit: Payer: Self-pay | Admitting: Oncology

## 2024-02-07 NOTE — Telephone Encounter (Signed)
 Would you like for this to be refilled?

## 2024-02-19 ENCOUNTER — Other Ambulatory Visit: Payer: Self-pay | Admitting: *Deleted

## 2024-02-19 DIAGNOSIS — F5000 Anorexia nervosa, unspecified: Secondary | ICD-10-CM | POA: Diagnosis not present

## 2024-02-20 ENCOUNTER — Inpatient Hospital Stay: Attending: Hematology | Admitting: Oncology

## 2024-02-20 ENCOUNTER — Inpatient Hospital Stay

## 2024-02-20 VITALS — BP 145/82 | HR 96 | Temp 98.2°F | Resp 20 | Wt 140.2 lb

## 2024-02-20 DIAGNOSIS — C9002 Multiple myeloma in relapse: Secondary | ICD-10-CM | POA: Insufficient documentation

## 2024-02-20 DIAGNOSIS — R0982 Postnasal drip: Secondary | ICD-10-CM | POA: Diagnosis not present

## 2024-02-20 DIAGNOSIS — C9000 Multiple myeloma not having achieved remission: Secondary | ICD-10-CM

## 2024-02-20 DIAGNOSIS — R600 Localized edema: Secondary | ICD-10-CM

## 2024-02-20 DIAGNOSIS — Z5112 Encounter for antineoplastic immunotherapy: Secondary | ICD-10-CM | POA: Insufficient documentation

## 2024-02-20 DIAGNOSIS — C7951 Secondary malignant neoplasm of bone: Secondary | ICD-10-CM | POA: Diagnosis present

## 2024-02-20 LAB — CBC WITH DIFFERENTIAL/PLATELET
Abs Immature Granulocytes: 0.01 K/uL (ref 0.00–0.07)
Basophils Absolute: 0 K/uL (ref 0.0–0.1)
Basophils Relative: 0 %
Eosinophils Absolute: 0 K/uL (ref 0.0–0.5)
Eosinophils Relative: 0 %
HCT: 39.1 % (ref 36.0–46.0)
Hemoglobin: 13 g/dL (ref 12.0–15.0)
Immature Granulocytes: 0 %
Lymphocytes Relative: 20 %
Lymphs Abs: 0.9 K/uL (ref 0.7–4.0)
MCH: 34.5 pg — ABNORMAL HIGH (ref 26.0–34.0)
MCHC: 33.2 g/dL (ref 30.0–36.0)
MCV: 103.7 fL — ABNORMAL HIGH (ref 80.0–100.0)
Monocytes Absolute: 0.6 K/uL (ref 0.1–1.0)
Monocytes Relative: 14 %
Neutro Abs: 2.8 K/uL (ref 1.7–7.7)
Neutrophils Relative %: 66 %
Platelets: 216 K/uL (ref 150–400)
RBC: 3.77 MIL/uL — ABNORMAL LOW (ref 3.87–5.11)
RDW: 13.1 % (ref 11.5–15.5)
WBC: 4.2 K/uL (ref 4.0–10.5)
nRBC: 0 % (ref 0.0–0.2)

## 2024-02-20 LAB — COMPREHENSIVE METABOLIC PANEL WITH GFR
ALT: 12 U/L (ref 0–44)
AST: 18 U/L (ref 15–41)
Albumin: 4.5 g/dL (ref 3.5–5.0)
Alkaline Phosphatase: 51 U/L (ref 38–126)
Anion gap: 15 (ref 5–15)
BUN: 9 mg/dL (ref 8–23)
CO2: 23 mmol/L (ref 22–32)
Calcium: 9.7 mg/dL (ref 8.9–10.3)
Chloride: 100 mmol/L (ref 98–111)
Creatinine, Ser: 0.58 mg/dL (ref 0.44–1.00)
GFR, Estimated: >60 mL/min (ref >60)
Glucose, Bld: 110 mg/dL — ABNORMAL HIGH (ref 70–99)
Potassium: 3.6 mmol/L (ref 3.5–5.1)
Sodium: 138 mmol/L (ref 135–145)
Total Bilirubin: 0.4 mg/dL (ref 0.0–1.2)
Total Protein: 6.5 g/dL (ref 6.5–8.1)

## 2024-02-20 LAB — MAGNESIUM: Magnesium: 2.2 mg/dL (ref 1.7–2.4)

## 2024-02-20 NOTE — Progress Notes (Addendum)
 Patient Care Team: Sheryle Carwin, MD as PCP - General (Internal Medicine) Stacia Diannah SQUIBB, MD as PCP - Cardiology (Cardiology)  Clinic Day:  02/20/2024  Referring physician: Sheryle Carwin, MD   CHIEF COMPLAINT:  CC: Recurrent stage II IgA kappa multiple myeloma, high risk    ASSESSMENT & PLAN:   Assessment & Plan: Jamie Ewing  is a 83 y.o. female with recurrent stage II IgA kappa multiple myeloma, high risk  Multiple Myeloma not having achieved remission Stage II IgA kappa multiple myeloma, standard risk Patient with complicated multiple myeloma history s/p several local XRT's, induction with RVD followed by bone marrow transplant in 2020.  She was then on Revlimid  maintenance for 5 years until recent progression Oncology history as below Patient is currently on daratumumab  + dexamethasone    - Labs reviewed today: CMP: Stable, normal creatinine and LFTs.  CBC: WBC: 4.2,hemoglobin: 13.0, platelets: 216. -Multiple myeloma labs from 01/29/2024: No M spike, IFE: IgA monoclonal protein with kappa light chain specificity, normal kappa free light chain and lambda free light chain with a normal ratio. - Patient is tolerating daratumumab  and dexamethasone  significantly better. - Continue daratumumab  and dexamethasone  monthly - Can restart Velcade  if patient has progression at any time  Return to clinic in 2 months with labs for follow-up and to assess for tolerance.  Metastatic multiple myeloma to bone Patient has multiple bone lesions secondary to multiple myeloma He is on Zometa  4 mg every 12 weeks   - Continue zometa  4 mg every 12 weeks - Continue vitamin D  and calcium  Other polyneuropathy Patient has some numbness and tingling in her hands and feet Currently not taking any medications and is tolerable   - Continue to monitor for now  Pedal Edema Patient has bilateral pedal edema extending up to the ankle Likely secondary to steroid use.  Improved significantly.   -  Continue Lasix  40 mg taken as needed - Significantly improved  Postnasal drip Patient reports mucus in her throat Recommended to start using Flonase twice a day to see if it helps with symptoms.  If not, can consider starting Protonix.   The patient understands the plans discussed today and is in agreement with them.  She knows to contact our office if she develops concerns prior to her next appointment.  The total time spent in the appointment was 22 minutes for the encounter with patient, including review of chart and various tests results, discussions about plan of care and coordination of care plan    I,Helena R Teague,acting as a scribe for Mickiel Dry, MD.,have documented all relevant documentation on the behalf of Mickiel Dry, MD,as directed by  Mickiel Dry, MD while in the presence of Mickiel Dry, MD.  I, Mickiel Dry MD, have reviewed the above documentation for accuracy and completeness, and I agree with the above.    Mickiel Dry, MD  [facility] [place] - A DEPT OF [facility] E[facility] [address] [place] [address] Dept: [phone] Dept Fax: [phone]   Orders Placed This Encounter  Procedures   Kappa/lambda light chains    Standing Status:   Future    Number of Occurrences:   1    Expected Date:   02/20/2024    Expiration Date:   02/19/2025     ONCOLOGY HISTORY:   I have reviewed her chart and materials related to her cancer extensively and collaborated history with the patient. Summary of oncologic history  is as follows:   Diagnosis: Recurrent stage II IgA kappa multiple myeloma, high risk   -10/06/2017: T9-T10 laminectomy with resection of epidural tumor and T8-T11 posterior fusion.  Pathology: Plasma cell neoplasm. IHC positive for CD138 and the plasma cells are kappa restricted by light chain in situ hybridization. -10/11/2017: Multiple Myeloma Panel.  SPEP: M spike: 2.1, IFE:  IgA monoclonal protein with kappa light chain specificity, free kappa light chain: 12.2, free lambda light chain: 1.2, ratio: 1.69 -10/12/2017: Bone Marrow Biopsy.  Pathology: Plasma cell neoplasm. The marrow is hypercellular with increased monoclonal plasma cells (9% aspirate, 20% CD138) 10/12/2017: Bone Survey: There are findings consistent with multiple myeloma with lytic lesions noted in the skull and left femur. No other lytic lesions. -10/30/2017-11/10/2017: XRT to thoracic spine -11/14/2017 -02/19/2018: 5 cycles of RVD -11/21/2017-Current: Zometa  4mg  every 12 weeks -04/03/2018: Stem cell transplant -08/24/2018-09/26/2023: Revlimid  maintenance therapy 10 mg 3 weeks on/1 week off, discontinued due to progression -09/02/2019: PET: Redemonstrated bilateral cervical, right supraclavicular and right axillary hypermetabolic adenopathy with interval resolution of left axillary adenopathy. Persistent increased FDG uptake in both palatine tonsils. Similar lytic and sclerotic lesions in bones with no corresponding FDG uptake.  -09/26/2019: Myeloma Panel. SPEP and immunofixation negative. FLC ratio normal with elevated kappa light chains at 32.5.  -10/21/2019: Right neck lymph node biopsy negative for malignancy.  -09/29/2022: PET: Active myeloma involving the right posterior 10th rib. Focal hypermetabolism in the left mid femoral shaft may reflect additional site of myeloma -11/03/2022 to 11/16/2022: XRT of 25 Gray in 10 fractions to rib lesion and mid femur lesion completed -01/31/2023: Revlimid  dose reduced to 5 mg 3 weeks on/1 week off due to decreased tolerance (decrease in energy and appetite, and hair thinning) -08/24/2023:  PET: New tracer avid lucent bone lesions within the posterior aspect of the left eleventh rib and right frontal bone. New lucent lesions within the right calvarial convexity and proximal shaft of left femur with mild increased tracer uptake. New foci mild increased uptake noted  within the right pedicle of the L3 vertebra and right posterior iliac bone without underlying lucent bone lesions. -09/12/2023: Bone Marrow Biopsy.  -Pathology: Hypercellular bone marrow (30%) involved by a kappa restricted plasma cell neoplasm (30 to 40%) in the background of otherwise orderly trilineage hematopoiesis.  -FISH: T p53 mutation, gain of longer of chromosome 1 (1 q.), monosomy 13  -Cytogenetics: 32, XX [20] -09/28/2023: MRI Orbits: There is a circumscribed avidly enhancing mass within the orbital roof which extends into the retro conal orbit and impresses upon the superior rectus muscle. A separate circumscribed lesion is noted within the sphenoid wing anterolateral to the anterior clinoid. -10/02/2023-current: Daratumumab  VD,  Velcade  reduced to 1 mg/m on day 8 on 10/23/2023 due to increased weakness in patient  -01/29/2024: Changed to daratumumab  and dexamethasone .  Velcade  discontinued for poor tolerance. -10/19/2023 -11/06/2023: XRT of 10 fractions to orbital lesion and left 11th rib  -11/26/2023-11/28/2023: Admitted to the hospital for fatigue and weakness -01/18/2024: patient evaluated by Dr. Eluterio at [facility] [hematology/oncology] who recommended reinitiating treatment to monthly daratumumab , and twice monthly Velcade  if daratumumab  is tolerable -01/18/2024: SPEP: No M-spike detected. No clonality detected on IFE.   -01/29/2024: SPEP: No M-spike detected. IFE shows IgA monoclonal protein with kappa light chain specificity.   Current Treatment: Monthly daratumumab  and dexamethasone   INTERVAL HISTORY:   Jamie Ewing is here today for follow up. Patient is accompanied by her granddaughter today.    She is tolerating monthly  daratumumab  well and her thrush has improved, though she still notes mucous in the throat. I have recommended Flonase. She was prescribed paxil to increase her appetite yesterday and she has gained 4 pounds since her last visit with me. We discussed her  labs today, which showed stable myeloma labs and stable blood/kidney function. She is not requiring lasix  as her edema is improved.   I have reviewed the past medical history, past surgical history, social history and family history with the patient and they are unchanged from previous note.  ALLERGIES:  has no known allergies.  MEDICATIONS:  Current Outpatient Medications  Medication Sig Dispense Refill   acetaminophen  (TYLENOL ) 500 MG tablet Take 1,000 mg by mouth at bedtime. (Patient taking differently: Take 1,000 mg by mouth every 4 (four) hours as needed.)     acyclovir  (ZOVIRAX ) 400 MG tablet Take 1 tablet (400 mg total) by mouth 2 (two) times daily. 60 tablet 11   dexamethasone  (DECADRON ) 4 MG tablet Take 1 tablet (4 mg total) by mouth daily. Take for 2 days starting the night of chemotherapy. 20 tablet 4   feeding supplement (ENSURE PLUS HIGH PROTEIN) LIQD Take 237 mLs by mouth 2 (two) times daily between meals.     furosemide  (LASIX ) 40 MG tablet TAKE 1 TABLET BY MOUTH DAILY AS NEEDED 90 tablet 3   lidocaine -prilocaine  (EMLA ) cream Apply to affected area once 30 g 3   LORazepam (ATIVAN) 0.5 MG tablet Take by mouth.     LOW-DOSE ASPIRIN PO Take 81 mg by mouth at bedtime.     nystatin  (MYCOSTATIN ) 100000 UNIT/ML suspension Take 5 mLs (500,000 Units total) by mouth 4 (four) times daily. Swish and spit 473 mL 1   ondansetron  (ZOFRAN ) 8 MG tablet Take 1 tablet (8 mg total) by mouth every 8 (eight) hours as needed for nausea or vomiting. 30 tablet 1   PARoxetine (PAXIL) 10 MG tablet Take 10 mg by mouth daily.     potassium chloride  SA (KLOR-CON  M) 20 MEQ tablet Take 20 mEq by mouth 2 (two) times daily.     prochlorperazine  (COMPAZINE ) 10 MG tablet Take 1 tablet (10 mg total) by mouth every 6 (six) hours as needed for nausea or vomiting. 30 tablet 1   No current facility-administered medications for this visit.    VITALS:  Blood pressure (!) 145/82, pulse 96, temperature 98.2 F (36.8  C), temperature source Oral, resp. rate 20, weight 140 lb 3.4 oz (63.6 kg), SpO2 99%.  Wt Readings from Last 3 Encounters:  02/20/24 140 lb 3.4 oz (63.6 kg)  01/29/24 136 lb 14.4 oz (62.1 kg)  01/10/24 145 lb 1 oz (65.8 kg)    Body mass index is 24.84 kg/m.  Performance status (ECOG): 2 - Symptomatic, <50% confined to bed  PHYSICAL EXAM:   GENERAL:alert, no distress and comfortable SKIN: skin color, texture, turgor are normal, no rashes or significant lesions LYMPH:  no palpable lymphadenopathy in the cervical, axillary or inguinal LUNGS: clear to auscultation and percussion with normal breathing effort HEART: regular rate & rhythm and no murmurs and no lower extremity edema ABDOMEN:abdomen soft, non-tender and normal bowel sounds Musculoskeletal:no cyanosis of digits and no clubbing  NEURO: alert & oriented x 3 with fluent speech  LABORATORY DATA:  I have reviewed the data as listed   Lab Results  Component Value Date   WBC 4.2 02/20/2024   NEUTROABS 2.8 02/20/2024   HGB 13.0 02/20/2024   HCT 39.1 02/20/2024   MCV  103.7 (H) 02/20/2024   PLT 216 02/20/2024      Chemistry      Component Value Date/Time   NA 138 02/20/2024 1136   K 3.6 02/20/2024 1136   CL 100 02/20/2024 1136   CO2 23 02/20/2024 1136   BUN 9 02/20/2024 1136   CREATININE 0.58 02/20/2024 1136      Component Value Date/Time   CALCIUM 9.7 02/20/2024 1136   ALKPHOS 51 02/20/2024 1136   AST 18 02/20/2024 1136   ALT 12 02/20/2024 1136   BILITOT 0.4 02/20/2024 1136      Latest Reference Range & Units 01/29/24 09:31  Total Protein ELP 6.0 - 8.5 g/dL 6.8 (C)  Albumin  SerPl Elph-Mcnc 2.9 - 4.4 g/dL 3.9 (C)  Albumin /Glob SerPl 0.7 - 1.7  1.4 (C)  Alpha2 Glob SerPl Elph-Mcnc 0.4 - 1.0 g/dL 1.1 (H) (C)  Alpha 1 0.0 - 0.4 g/dL 0.3 (C)  Gamma Glob SerPl Elph-Mcnc 0.4 - 1.8 g/dL 0.5 (C)  M Protein SerPl Elph-Mcnc Not Observed g/dL Not Observed (C)  IFE 1  Comment ! (C)  Globulin, Total 2.2 - 3.9 g/dL 2.9  (C)  B-Globulin SerPl Elph-Mcnc 0.7 - 1.3 g/dL 1.1 (C)  IgG (Immunoglobin G), Serum 586 - 1,602 mg/dL 424 (L)  IgM (Immunoglobulin M), Srm 26 - 217 mg/dL 5 (L)  IgA 64 - 577 mg/dL 847  (H): Data is abnormally high !: Data is abnormal (L): Data is abnormally low (C): Corrected   Latest Reference Range & Units 01/29/24 09:39  Kappa free light chain 3.3 - 19.4 mg/L 6.4  Lambda free light chains 5.7 - 26.3 mg/L 7.2  Kappa, lambda light chain ratio 0.26 - 1.65  0.89    RADIOGRAPHIC STUDIES: I have personally reviewed the radiological images as listed and agreed with the findings in the report.

## 2024-02-21 ENCOUNTER — Other Ambulatory Visit: Payer: Self-pay

## 2024-02-21 LAB — KAPPA/LAMBDA LIGHT CHAINS
Kappa free light chain: 4.9 mg/L (ref 3.3–19.4)
Kappa, lambda light chain ratio: 1.58 (ref 0.26–1.65)
Lambda free light chains: 3.1 mg/L — ABNORMAL LOW (ref 5.7–26.3)

## 2024-02-22 LAB — MULTIPLE MYELOMA PANEL, SERUM
Albumin SerPl Elph-Mcnc: 3.5 g/dL (ref 2.9–4.4)
Albumin/Glob SerPl: 1.3 (ref 0.7–1.7)
Alpha 1: 0.3 g/dL (ref 0.0–0.4)
Alpha2 Glob SerPl Elph-Mcnc: 0.9 g/dL (ref 0.4–1.0)
B-Globulin SerPl Elph-Mcnc: 1 g/dL (ref 0.7–1.3)
Gamma Glob SerPl Elph-Mcnc: 0.5 g/dL (ref 0.4–1.8)
Globulin, Total: 2.7 g/dL (ref 2.2–3.9)
IgA: 122 mg/dL (ref 64–422)
IgG (Immunoglobin G), Serum: 544 mg/dL — ABNORMAL LOW (ref 586–1602)
IgM (Immunoglobulin M), Srm: <5 mg/dL — ABNORMAL LOW (ref 26–217)
Total Protein ELP: 6.2 g/dL (ref 6.0–8.5)

## 2024-02-27 ENCOUNTER — Inpatient Hospital Stay

## 2024-02-27 VITALS — BP 153/82 | HR 94 | Temp 98.2°F | Resp 20 | Wt 134.6 lb

## 2024-02-27 DIAGNOSIS — Z5112 Encounter for antineoplastic immunotherapy: Secondary | ICD-10-CM | POA: Diagnosis not present

## 2024-02-27 DIAGNOSIS — C9000 Multiple myeloma not having achieved remission: Secondary | ICD-10-CM

## 2024-02-27 MED ORDER — ACETAMINOPHEN 325 MG PO TABS
650.0000 mg | ORAL_TABLET | Freq: Once | ORAL | Status: AC
  Administered 2024-02-27: 650 mg via ORAL
  Filled 2024-02-27: qty 2

## 2024-02-27 MED ORDER — DARATUMUMAB-HYALURONIDASE-FIHJ 1800-30000 MG-UT/15ML ~~LOC~~ SOLN
1800.0000 mg | Freq: Once | SUBCUTANEOUS | Status: AC
  Administered 2024-02-27: 1800 mg via SUBCUTANEOUS
  Filled 2024-02-27: qty 15

## 2024-02-27 MED ORDER — CETIRIZINE HCL 10 MG PO TABS
10.0000 mg | ORAL_TABLET | Freq: Once | ORAL | Status: AC
  Administered 2024-02-27: 10 mg via ORAL
  Filled 2024-02-27: qty 1

## 2024-02-27 MED ORDER — ZOLEDRONIC ACID 4 MG/100ML IV SOLN
4.0000 mg | Freq: Once | INTRAVENOUS | Status: AC
  Administered 2024-02-27: 4 mg via INTRAVENOUS
  Filled 2024-02-27: qty 100

## 2024-02-27 MED ORDER — SODIUM CHLORIDE 0.9 % IV SOLN: Freq: Once | INTRAVENOUS | Status: AC

## 2024-02-27 MED ORDER — DEXAMETHASONE 4 MG PO TABS
20.0000 mg | ORAL_TABLET | Freq: Once | ORAL | Status: AC
  Administered 2024-02-27: 20 mg via ORAL
  Filled 2024-02-27: qty 5

## 2024-02-27 NOTE — Progress Notes (Signed)
 Patient presents today for Daratumumab  injection and Zometa  infusion per providers order.  Vital signs and labs (02/20/24) within parameters for treatment.  Patient has no new complaints at this time.  Treatment given today per MD orders.  Stable during injection and infusion without adverse affects.  Injection site WNL; see MAR for injection details.  No questions or complaints noted at this time. Vital signs stable.  Discharge from clinic via wheelchair in stable condition.  Alert and oriented X 3.  Follow up with [facility] as scheduled.

## 2024-02-27 NOTE — Patient Instructions (Signed)
 [facility] [place] - A DEPT OF [facility]  Discharge Instructions: Thank you for choosing [facility] to provide your oncology and hematology care.  If you have a lab appointment with the Cancer Center - please note that after April 8th, 2024, all labs will be drawn in the cancer center.  You do not have to check in or register with the main entrance as you have in the past but will complete your check-in in the cancer center.  Wear comfortable clothing and clothing appropriate for easy access to any Portacath or PICC line.   We strive to give you quality time with your provider. You may need to reschedule your appointment if you arrive late (15 or more minutes).  Arriving late affects you and other patients whose appointments are after yours.  Also, if you miss three or more appointments without notifying the office, you may be dismissed from the clinic at the providers discretion.      For prescription refill requests, have your pharmacy contact our office and allow 72 hours for refills to be completed.    Today you received the following chemotherapy and/or immunotherapy agents daratumumab /zometa       To help prevent nausea and vomiting after your treatment, we encourage you to take your nausea medication as directed.  BELOW ARE SYMPTOMS THAT SHOULD BE REPORTED IMMEDIATELY: *FEVER GREATER THAN 100.4 F (38 C) OR HIGHER *CHILLS OR SWEATING *NAUSEA AND VOMITING THAT IS NOT CONTROLLED WITH YOUR NAUSEA MEDICATION *UNUSUAL SHORTNESS OF BREATH *UNUSUAL BRUISING OR BLEEDING *URINARY PROBLEMS (pain or burning when urinating, or frequent urination) *BOWEL PROBLEMS (unusual diarrhea, constipation, pain near the anus) TENDERNESS IN MOUTH AND THROAT WITH OR WITHOUT PRESENCE OF ULCERS (sore throat, sores in mouth, or a toothache) UNUSUAL RASH, SWELLING OR PAIN  UNUSUAL VAGINAL DISCHARGE OR ITCHING   Items with * indicate a potential emergency and should be  followed up as soon as possible or go to the Emergency Department if any problems should occur.  Please show the CHEMOTHERAPY ALERT CARD or IMMUNOTHERAPY ALERT CARD at check-in to the Emergency Department and triage nurse.  Should you have questions after your visit or need to cancel or reschedule your appointment, please contact [facility] [place] - A DEPT OF [facility] [phone]  and follow the prompts.  Office hours are 8:00 a.m. to 4:30 p.m. Monday - Friday. Please note that voicemails left after 4:00 p.m. may not be returned until the following business day.  We are closed weekends and major holidays. You have access to a nurse at all times for urgent questions. Please call the main number to the clinic [phone] and follow the prompts.  For any non-urgent questions, you may also contact your provider using MyChart. We now offer e-Visits for anyone 67 and older to request care online for non-urgent symptoms. For details visit [email or web address].   Also download the MyChart app! Go to the app store, search MyChart, open the app, select [place], and log in with your MyChart username and password.

## 2024-03-14 ENCOUNTER — Other Ambulatory Visit: Payer: Self-pay

## 2024-03-18 ENCOUNTER — Encounter: Payer: Self-pay | Admitting: *Deleted

## 2024-03-19 ENCOUNTER — Other Ambulatory Visit: Payer: Self-pay | Admitting: Oncology

## 2024-03-19 DIAGNOSIS — C9000 Multiple myeloma not having achieved remission: Secondary | ICD-10-CM

## 2024-03-26 ENCOUNTER — Inpatient Hospital Stay: Attending: Hematology

## 2024-03-26 ENCOUNTER — Inpatient Hospital Stay

## 2024-03-26 VITALS — BP 122/71 | HR 103 | Temp 96.2°F | Resp 20 | Wt 123.4 lb

## 2024-03-26 DIAGNOSIS — C9000 Multiple myeloma not having achieved remission: Secondary | ICD-10-CM | POA: Diagnosis present

## 2024-03-26 DIAGNOSIS — R633 Feeding difficulties, unspecified: Secondary | ICD-10-CM

## 2024-03-26 DIAGNOSIS — R1310 Dysphagia, unspecified: Secondary | ICD-10-CM

## 2024-03-26 DIAGNOSIS — R634 Abnormal weight loss: Secondary | ICD-10-CM

## 2024-03-26 DIAGNOSIS — C7951 Secondary malignant neoplasm of bone: Secondary | ICD-10-CM | POA: Diagnosis present

## 2024-03-26 LAB — CBC WITH DIFFERENTIAL/PLATELET
Abs Immature Granulocytes: 0.01 K/uL (ref 0.00–0.07)
Basophils Absolute: 0 K/uL (ref 0.0–0.1)
Basophils Relative: 1 %
Eosinophils Absolute: 0.1 K/uL (ref 0.0–0.5)
Eosinophils Relative: 1 %
HCT: 40.8 % (ref 36.0–46.0)
Hemoglobin: 13.7 g/dL (ref 12.0–15.0)
Immature Granulocytes: 0 %
Lymphocytes Relative: 12 %
Lymphs Abs: 0.7 K/uL (ref 0.7–4.0)
MCH: 33.5 pg (ref 26.0–34.0)
MCHC: 33.6 g/dL (ref 30.0–36.0)
MCV: 99.8 fL (ref 80.0–100.0)
Monocytes Absolute: 0.9 K/uL (ref 0.1–1.0)
Monocytes Relative: 16 %
Neutro Abs: 3.9 K/uL (ref 1.7–7.7)
Neutrophils Relative %: 70 %
Platelets: 226 K/uL (ref 150–400)
RBC: 4.09 MIL/uL (ref 3.87–5.11)
RDW: 13.6 % (ref 11.5–15.5)
WBC: 5.7 K/uL (ref 4.0–10.5)
nRBC: 0 % (ref 0.0–0.2)

## 2024-03-26 LAB — COMPREHENSIVE METABOLIC PANEL WITH GFR
ALT: 10 U/L (ref 0–44)
AST: 17 U/L (ref 15–41)
Albumin: 4.1 g/dL (ref 3.5–5.0)
Alkaline Phosphatase: 56 U/L (ref 38–126)
Anion gap: 14 (ref 5–15)
BUN: 9 mg/dL (ref 8–23)
CO2: 25 mmol/L (ref 22–32)
Calcium: 9.6 mg/dL (ref 8.9–10.3)
Chloride: 97 mmol/L — ABNORMAL LOW (ref 98–111)
Creatinine, Ser: 0.62 mg/dL (ref 0.44–1.00)
GFR, Estimated: >60 mL/min
Glucose, Bld: 114 mg/dL — ABNORMAL HIGH (ref 70–99)
Potassium: 3.4 mmol/L — ABNORMAL LOW (ref 3.5–5.1)
Sodium: 136 mmol/L (ref 135–145)
Total Bilirubin: 0.5 mg/dL (ref 0.0–1.2)
Total Protein: 6.3 g/dL — ABNORMAL LOW (ref 6.5–8.1)

## 2024-03-26 LAB — MAGNESIUM: Magnesium: 2 mg/dL (ref 1.7–2.4)

## 2024-03-26 MED ORDER — MEGESTROL ACETATE 400 MG/10ML PO SUSP: 400.0000 mg | Freq: Two times a day (BID) | ORAL | 3 refills | Status: AC

## 2024-03-26 MED ORDER — SODIUM CHLORIDE 0.9 % IV SOLN: Freq: Once | INTRAVENOUS | Status: AC

## 2024-03-26 NOTE — Progress Notes (Addendum)
 Patient presents today for Darzalex  Faspro per provider's order. Vital signs stable and patient voiced complaints of  no appetite, weakness, diarrhea, and difficulty swallowing. Dr.Kandala made aware. Isaiah Piety, RN at bedside. No treatment today patient will receive 1 Liter of NS over 1 hour per Dr.Kandala   Discharged from clinic via wheelchair in stable condition. Alert and oriented x 3. F/U with [facility] as scheduled.

## 2024-03-26 NOTE — Patient Instructions (Signed)
 [facility] [place] - A DEPT OF [facility]  Discharge Instructions: Thank you for choosing [facility] to provide your oncology and hematology care.  If you have a lab appointment with the Cancer Center - please note that after April 8th, 2024, all labs will be drawn in the cancer center.  You do not have to check in or register with the main entrance as you have in the past but will complete your check-in in the cancer center.  Wear comfortable clothing and clothing appropriate for easy access to any Portacath or PICC line.   We strive to give you quality time with your provider. You may need to reschedule your appointment if you arrive late (15 or more minutes).  Arriving late affects you and other patients whose appointments are after yours.  Also, if you miss three or more appointments without notifying the office, you may be dismissed from the clinic at the provider's discretion.      For prescription refill requests, have your pharmacy contact our office and allow 72 hours for refills to be completed.    Today you received 1Liter of NS over 1 hour     BELOW ARE SYMPTOMS THAT SHOULD BE REPORTED IMMEDIATELY: *FEVER GREATER THAN 100.4 F (38 C) OR HIGHER *CHILLS OR SWEATING *NAUSEA AND VOMITING THAT IS NOT CONTROLLED WITH YOUR NAUSEA MEDICATION *UNUSUAL SHORTNESS OF BREATH *UNUSUAL BRUISING OR BLEEDING *URINARY PROBLEMS (pain or burning when urinating, or frequent urination) *BOWEL PROBLEMS (unusual diarrhea, constipation, pain near the anus) TENDERNESS IN MOUTH AND THROAT WITH OR WITHOUT PRESENCE OF ULCERS (sore throat, sores in mouth, or a toothache) UNUSUAL RASH, SWELLING OR PAIN  UNUSUAL VAGINAL DISCHARGE OR ITCHING   Items with * indicate a potential emergency and should be followed up as soon as possible or go to the Emergency Department if any problems should occur.  Please show the CHEMOTHERAPY ALERT CARD or IMMUNOTHERAPY ALERT CARD at  check-in to the Emergency Department and triage nurse.  Should you have questions after your visit or need to cancel or reschedule your appointment, please contact [facility] [place] - A DEPT OF [facility] [phone]  and follow the prompts.  Office hours are 8:00 a.m. to 4:30 p.m. Monday - Friday. Please note that voicemails left after 4:00 p.m. may not be returned until the following business day.  We are closed weekends and major holidays. You have access to a nurse at all times for urgent questions. Please call the main number to the clinic [phone] and follow the prompts.  For any non-urgent questions, you may also contact your provider using MyChart. We now offer e-Visits for anyone 19 and older to request care online for non-urgent symptoms. For details visit [email or web address].   Also download the MyChart app! Go to the app store, search MyChart, open the app, select [place], and log in with your MyChart username and password.

## 2024-03-27 LAB — TYPE AND SCREEN
ABO/RH(D): O POS
Antibody Screen: POSITIVE

## 2024-03-27 LAB — KAPPA/LAMBDA LIGHT CHAINS
Kappa free light chain: 5.9 mg/L (ref 3.3–19.4)
Kappa, lambda light chain ratio: 1.9 — ABNORMAL HIGH (ref 0.26–1.65)
Lambda free light chains: 3.1 mg/L — ABNORMAL LOW (ref 5.7–26.3)

## 2024-03-28 ENCOUNTER — Other Ambulatory Visit: Payer: Self-pay

## 2024-03-28 LAB — MULTIPLE MYELOMA PANEL, SERUM
Albumin SerPl Elph-Mcnc: 3.4 g/dL (ref 2.9–4.4)
Albumin/Glob SerPl: 1.4 (ref 0.7–1.7)
Alpha 1: 0.3 g/dL (ref 0.0–0.4)
Alpha2 Glob SerPl Elph-Mcnc: 1 g/dL (ref 0.4–1.0)
B-Globulin SerPl Elph-Mcnc: 0.9 g/dL (ref 0.7–1.3)
Gamma Glob SerPl Elph-Mcnc: 0.5 g/dL (ref 0.4–1.8)
Globulin, Total: 2.6 g/dL (ref 2.2–3.9)
IgA: 147 mg/dL (ref 64–422)
IgG (Immunoglobin G), Serum: 495 mg/dL — ABNORMAL LOW (ref 586–1602)
IgM (Immunoglobulin M), Srm: 6 mg/dL — ABNORMAL LOW (ref 26–217)
M Protein SerPl Elph-Mcnc: 0.1 g/dL — ABNORMAL HIGH
Total Protein ELP: 6 g/dL (ref 6.0–8.5)

## 2024-03-29 NOTE — Progress Notes (Signed)
 Patient called to confirm that the referral was sent on 1/6 for gastroenterology. Confirmed to patient it was sent on 1/6 and can take up to around 2 weeks to receive a call for scheduling. Patient verbalized understanding.

## 2024-04-01 ENCOUNTER — Encounter (INDEPENDENT_AMBULATORY_CARE_PROVIDER_SITE_OTHER): Payer: Self-pay | Admitting: *Deleted

## 2024-04-09 ENCOUNTER — Other Ambulatory Visit: Payer: Self-pay

## 2024-04-12 ENCOUNTER — Emergency Department (HOSPITAL_COMMUNITY)

## 2024-04-12 ENCOUNTER — Inpatient Hospital Stay (HOSPITAL_COMMUNITY)
Admission: EM | Admit: 2024-04-12 | Discharge: 2024-04-17 | DRG: 640 | Disposition: A | Discharge status: Skilled Nursing Facility | Attending: Internal Medicine | Admitting: Internal Medicine

## 2024-04-12 ENCOUNTER — Other Ambulatory Visit: Payer: Self-pay

## 2024-04-12 ENCOUNTER — Telehealth: Payer: Self-pay

## 2024-04-12 ENCOUNTER — Encounter (HOSPITAL_COMMUNITY): Payer: Self-pay | Admitting: Internal Medicine

## 2024-04-12 DIAGNOSIS — I350 Nonrheumatic aortic (valve) stenosis: Secondary | ICD-10-CM | POA: Diagnosis present

## 2024-04-12 DIAGNOSIS — R531 Weakness: Secondary | ICD-10-CM | POA: Diagnosis not present

## 2024-04-12 DIAGNOSIS — Z823 Family history of stroke: Secondary | ICD-10-CM | POA: Diagnosis not present

## 2024-04-12 DIAGNOSIS — G9341 Metabolic encephalopathy: Secondary | ICD-10-CM | POA: Diagnosis present

## 2024-04-12 DIAGNOSIS — R627 Adult failure to thrive: Secondary | ICD-10-CM | POA: Diagnosis present

## 2024-04-12 DIAGNOSIS — Z7982 Long term (current) use of aspirin: Secondary | ICD-10-CM | POA: Diagnosis not present

## 2024-04-12 DIAGNOSIS — Z8249 Family history of ischemic heart disease and other diseases of the circulatory system: Secondary | ICD-10-CM

## 2024-04-12 DIAGNOSIS — W19XXXA Unspecified fall, initial encounter: Secondary | ICD-10-CM | POA: Diagnosis present

## 2024-04-12 DIAGNOSIS — R296 Repeated falls: Secondary | ICD-10-CM | POA: Diagnosis present

## 2024-04-12 DIAGNOSIS — Z833 Family history of diabetes mellitus: Secondary | ICD-10-CM

## 2024-04-12 DIAGNOSIS — Z831 Family history of other infectious and parasitic diseases: Secondary | ICD-10-CM | POA: Diagnosis not present

## 2024-04-12 DIAGNOSIS — M4804 Spinal stenosis, thoracic region: Secondary | ICD-10-CM | POA: Diagnosis present

## 2024-04-12 DIAGNOSIS — R1310 Dysphagia, unspecified: Secondary | ICD-10-CM | POA: Diagnosis present

## 2024-04-12 DIAGNOSIS — R5381 Other malaise: Secondary | ICD-10-CM | POA: Insufficient documentation

## 2024-04-12 DIAGNOSIS — Z8419 Family history of other disorders of kidney and ureter: Secondary | ICD-10-CM | POA: Diagnosis not present

## 2024-04-12 DIAGNOSIS — Z751 Person awaiting admission to adequate facility elsewhere: Secondary | ICD-10-CM | POA: Diagnosis not present

## 2024-04-12 DIAGNOSIS — E7800 Pure hypercholesterolemia, unspecified: Secondary | ICD-10-CM | POA: Diagnosis present

## 2024-04-12 DIAGNOSIS — N3000 Acute cystitis without hematuria: Secondary | ICD-10-CM | POA: Diagnosis not present

## 2024-04-12 DIAGNOSIS — C9000 Multiple myeloma not having achieved remission: Secondary | ICD-10-CM | POA: Diagnosis present

## 2024-04-12 DIAGNOSIS — N390 Urinary tract infection, site not specified: Secondary | ICD-10-CM | POA: Diagnosis present

## 2024-04-12 DIAGNOSIS — I10 Essential (primary) hypertension: Secondary | ICD-10-CM | POA: Diagnosis present

## 2024-04-12 DIAGNOSIS — E860 Dehydration: Secondary | ICD-10-CM | POA: Diagnosis present

## 2024-04-12 DIAGNOSIS — Z87891 Personal history of nicotine dependence: Secondary | ICD-10-CM

## 2024-04-12 DIAGNOSIS — Z79899 Other long term (current) drug therapy: Secondary | ICD-10-CM | POA: Diagnosis not present

## 2024-04-12 DIAGNOSIS — Z96652 Presence of left artificial knee joint: Secondary | ICD-10-CM | POA: Diagnosis present

## 2024-04-12 DIAGNOSIS — Y92009 Unspecified place in unspecified non-institutional (private) residence as the place of occurrence of the external cause: Secondary | ICD-10-CM | POA: Diagnosis not present

## 2024-04-12 DIAGNOSIS — G629 Polyneuropathy, unspecified: Secondary | ICD-10-CM | POA: Diagnosis present

## 2024-04-12 DIAGNOSIS — Z923 Personal history of irradiation: Secondary | ICD-10-CM

## 2024-04-12 DIAGNOSIS — R410 Disorientation, unspecified: Principal | ICD-10-CM

## 2024-04-12 DIAGNOSIS — R4182 Altered mental status, unspecified: Secondary | ICD-10-CM | POA: Diagnosis present

## 2024-04-12 LAB — URINALYSIS, ROUTINE W REFLEX MICROSCOPIC
Bacteria, UA: NONE SEEN
Bilirubin Urine: NEGATIVE
Glucose, UA: NEGATIVE mg/dL
Hgb urine dipstick: NEGATIVE
Ketones, ur: 5 mg/dL — AB
Nitrite: NEGATIVE
Protein, ur: 30 mg/dL — AB
Specific Gravity, Urine: 1.016 (ref 1.005–1.030)
pH: 5 (ref 5.0–8.0)

## 2024-04-12 LAB — CK: Total CK: 75 U/L (ref 38–234)

## 2024-04-12 LAB — COMPREHENSIVE METABOLIC PANEL WITH GFR
ALT: 12 U/L (ref 0–44)
AST: 26 U/L (ref 15–41)
Albumin: 3.7 g/dL (ref 3.5–5.0)
Alkaline Phosphatase: 57 U/L (ref 38–126)
Anion gap: 19 — ABNORMAL HIGH (ref 5–15)
BUN: 10 mg/dL (ref 8–23)
CO2: 19 mmol/L — ABNORMAL LOW (ref 22–32)
Calcium: 10 mg/dL (ref 8.9–10.3)
Chloride: 98 mmol/L (ref 98–111)
Creatinine, Ser: 0.67 mg/dL (ref 0.44–1.00)
GFR, Estimated: >60 mL/min
Glucose, Bld: 106 mg/dL — ABNORMAL HIGH (ref 70–99)
Potassium: 4.3 mmol/L (ref 3.5–5.1)
Sodium: 136 mmol/L (ref 135–145)
Total Bilirubin: 0.6 mg/dL (ref 0.0–1.2)
Total Protein: 6.8 g/dL (ref 6.5–8.1)

## 2024-04-12 LAB — CBC WITH DIFFERENTIAL/PLATELET
Abs Immature Granulocytes: 0.07 K/uL (ref 0.00–0.07)
Basophils Absolute: 0 K/uL (ref 0.0–0.1)
Basophils Relative: 0 %
Eosinophils Absolute: 0 K/uL (ref 0.0–0.5)
Eosinophils Relative: 0 %
HCT: 33.5 % — ABNORMAL LOW (ref 36.0–46.0)
Hemoglobin: 11.3 g/dL — ABNORMAL LOW (ref 12.0–15.0)
Immature Granulocytes: 1 %
Lymphocytes Relative: 11 %
Lymphs Abs: 0.9 K/uL (ref 0.7–4.0)
MCH: 32.8 pg (ref 26.0–34.0)
MCHC: 33.7 g/dL (ref 30.0–36.0)
MCV: 97.1 fL (ref 80.0–100.0)
Monocytes Absolute: 1 K/uL (ref 0.1–1.0)
Monocytes Relative: 14 %
Neutro Abs: 5.5 K/uL (ref 1.7–7.7)
Neutrophils Relative %: 74 %
Platelets: 246 K/uL (ref 150–400)
RBC: 3.45 MIL/uL — ABNORMAL LOW (ref 3.87–5.11)
RDW: 14.1 % (ref 11.5–15.5)
WBC: 7.5 K/uL (ref 4.0–10.5)
nRBC: 0 % (ref 0.0–0.2)

## 2024-04-12 LAB — MAGNESIUM: Magnesium: 2 mg/dL (ref 1.7–2.4)

## 2024-04-12 LAB — LACTIC ACID, PLASMA: Lactic Acid, Venous: 1.9 mmol/L (ref 0.5–1.9)

## 2024-04-12 MED ORDER — LACTATED RINGERS IV BOLUS
500.0000 mL | Freq: Once | INTRAVENOUS | Status: AC
  Administered 2024-04-12: 500 mL via INTRAVENOUS

## 2024-04-12 MED ORDER — SODIUM CHLORIDE 0.9 % IV SOLN
1.0000 g | Freq: Once | INTRAVENOUS | Status: AC
  Administered 2024-04-12: 1 g via INTRAVENOUS
  Filled 2024-04-12: qty 10

## 2024-04-12 MED ORDER — LORAZEPAM 0.5 MG PO TABS
0.5000 mg | ORAL_TABLET | Freq: Once | ORAL | Status: AC
  Administered 2024-04-12: 0.5 mg via ORAL
  Filled 2024-04-12: qty 1

## 2024-04-12 MED ORDER — SODIUM CHLORIDE 0.9 % IV SOLN: INTRAVENOUS | Status: DC

## 2024-04-12 MED ORDER — LACTATED RINGERS IV BOLUS
1000.0000 mL | Freq: Once | INTRAVENOUS | Status: AC
  Administered 2024-04-12: 1000 mL via INTRAVENOUS

## 2024-04-12 NOTE — H&P (Addendum)
 " History and Physical    Patient: Jamie Ewing FMW:[id number] DOB: 02-Jun-1940 DOA: 04/12/2024 DOS: the patient was seen and examined on 04/12/2024 PCP: Sheryle Carwin, MD  Patient coming from: Home  Chief Complaint:  Chief Complaint  Patient presents with   Fall   Altered Mental Status   HPI: Jamie Ewing is a 84 y.o. female with medical history significant of hypertension, multiple myeloma, peripheral neuropathy, aortic valve stenosis who presents to the emergency department due to 5-day onset of back pain and worsening confusion (at baseline, she was not always oriented to time and place).  She was reported to have had 2 falls within the past week with report of suspicion on hitting her head on both times, though falls were unwitnessed.  She started to complain of back pain after the falls.  Most of the history was obtained from EDP and ED medical record.  Per report, patient was reported to have had altered mental status in the past when she had UTI/dehydration.  She denies chest pain, shortness of breath, fever.  Patient lives alone. ED course In the emergency department, she was tachycardic, but other vital signs were within normal range.  Workup in the ED showed normocytic anemia, BMP was normal except for bicarb of 19 and blood glucose of 106.  Urinalysis was suspicious for UTI CT of head without contrast showed no acute intracranial abnormality CT cervical spine showed no acute cervical spine fracture or subluxation She was started on IV ceftriaxone , IV hydration was provided, Ativan  was given.  TRH was asked to admit patient     Review of Systems: As mentioned in the history of present illness. All other systems reviewed and are negative. Past Medical History:  Diagnosis Date   Back pain    Cancer (HCC)    mult myeloma 2019   History of radiation therapy    RIght orbit and left chest- 10/19/23-11/06/23- Dr. Lynwood Nasuti   Hypercholesteremia    Hypertension    Recurrent multiple  myeloma of bone marrow with unknown EBV status (HCC)    Past Surgical History:  Procedure Laterality Date   BONE MARROW BIOPSY     LAMINECTOMY N/A 10/06/2017   Procedure: THORACIC NINE AND TEN LAMINECTOMY WITH RESECTION OF TUMOR, THORACIC EIGHT TO THORACIC ELEVEN FUSION WITH PEDICLE SCREW FIXATION;  Surgeon: Louis Shove, MD;  Location: [facility] OR;  Service: Neurosurgery;  Laterality: N/A;   REPLACEMENT TOTAL KNEE Left 2003   TOTAL KNEE ARTHROPLASTY  2001   Social History:  reports that she quit smoking about 17 years ago. Her smoking use included cigarettes. She started smoking about 47 years ago. She has a 15 pack-year smoking history. She has never used smokeless tobacco. She reports current alcohol use. She reports that she does not use drugs.  Allergies[1]  Family History  Problem Relation Age of Onset   Tuberculosis Mother    Kidney failure Father    Hypertension Paternal Aunt    Diabetes Paternal Aunt    Hypertension Paternal Uncle    Diabetes Paternal Uncle    Hypertension Daughter    Stroke Daughter     Prior to Admission medications  Medication Sig Start Date End Date Taking? Authorizing Provider  acetaminophen  (TYLENOL ) 500 MG tablet Take 1,000 mg by mouth at bedtime. Patient taking differently: Take 1,000 mg by mouth every 4 (four) hours as needed. 10/21/19   [provider]  acyclovir  (ZOVIRAX ) 400 MG tablet Take 1 tablet (400 mg total) by mouth  2 (two) times daily. 01/29/24   Davonna Siad, MD  dexamethasone  (DECADRON ) 4 MG tablet Take 1 tablet (4 mg total) by mouth daily. Take for 2 days starting the night of chemotherapy. 01/29/24   Davonna Siad, MD  feeding supplement (ENSURE PLUS HIGH PROTEIN) LIQD Take 237 mLs by mouth 2 (two) times daily between meals. 11/28/23   Evonnie Lenis, MD  furosemide  (LASIX ) 40 MG tablet TAKE 1 TABLET BY MOUTH DAILY AS NEEDED 02/07/24   Kandala, Hyndavi, MD  lidocaine -prilocaine  (EMLA ) cream Apply to affected area once 01/29/24    Kandala, Hyndavi, MD  LORazepam  (ATIVAN ) 0.5 MG tablet Take by mouth. 01/03/24   [provider]  LOW-DOSE ASPIRIN PO Take 81 mg by mouth at bedtime.    [provider]  megestrol  (MEGACE ) 400 MG/10ML suspension Take 10 mLs (400 mg total) by mouth 2 (two) times daily. 03/26/24   Davonna Siad, MD  nystatin  (MYCOSTATIN ) 100000 UNIT/ML suspension Take 5 mLs (500,000 Units total) by mouth 4 (four) times daily. Swish and spit 01/29/24   Davonna Siad, MD  ondansetron  (ZOFRAN ) 8 MG tablet Take 1 tablet (8 mg total) by mouth every 8 (eight) hours as needed for nausea or vomiting. 01/29/24   Davonna Siad, MD  PARoxetine  (PAXIL ) 10 MG tablet Take 10 mg by mouth daily. 02/19/24   [provider]  potassium chloride  SA (KLOR-CON  M) 20 MEQ tablet Take 20 mEq by mouth 2 (two) times daily.    [provider]  prochlorperazine  (COMPAZINE ) 10 MG tablet Take 1 tablet (10 mg total) by mouth every 6 (six) hours as needed for nausea or vomiting. 01/29/24   Davonna Siad, MD    Physical Exam: Vitals:   04/12/24 2030 04/12/24 2045 04/12/24 2100 04/12/24 2107  BP: (!) 124/55  126/64   Pulse: (!) 115 (!) 106 (!) 109 (!) 109  Resp:   14   Temp: 98.7 F (37.1 C)     TempSrc: Oral     SpO2: 98% 100% 100% 100%   General: Elderly female. Awake and alert but disoriented. Not in any acute distress.  HEENT: NCAT.  PERRLA. EOMI. Sclerae anicteric.  Dry mucosal membranes. Neck: Neck supple without lymphadenopathy. No carotid bruits. No masses palpated.  Cardiovascular: Regular rate with normal S1-S2 sounds. No murmurs, rubs or gallops auscultated. No JVD.  Respiratory: Clear breath sounds.  No accessory muscle use. Abdomen: Soft, nontender, nondistended. Active bowel sounds. No masses or hepatosplenomegaly  Skin: No rashes, lesions, or ulcerations.  Dry, warm to touch. Musculoskeletal: Tender to palpation of thoracic vertebrae.  2+ dorsalis pedis and radial pulses. Good ROM.   No contractures  Psychiatric: Intact judgment and insight.  Mood appropriate to current condition. Neurologic: No focal neurological deficits. Strength is 5/5 x 4.  CN II - XII grossly intact.  Assessment and Plan: Presumed UTI POA Patient was started on IV ceftriaxone , we shall continue with same at this time Urine and blood cultures pending  Fall at home Continue fall precaution Continue PT/OT eval and treat  Failure to thrive in adult Dehydration/deconditioning Generalized weakness Continue IV hydration Continue Megace  per home regimen Dietitian will be consulted and we shall await further recommendations   Advance Care Planning: Full code  Consults: Dietitian  Family Communication: None at bedside  Severity of Illness: The appropriate patient status for this patient is INPATIENT. Inpatient status is judged to be reasonable and necessary in order to provide the required intensity of service to ensure the patient's safety. The  patient's presenting symptoms, physical exam findings, and initial radiographic and laboratory data in the context of their chronic comorbidities is felt to place them at high risk for further clinical deterioration. Furthermore, it is not anticipated that the patient will be medically stable for discharge from the hospital within 2 midnights of admission.   * I certify that at the point of admission it is my clinical judgment that the patient will require inpatient hospital care spanning beyond 2 midnights from the point of admission due to high intensity of service, high risk for further deterioration and high frequency of surveillance required.*  Author: Issaih Kaus, DO 04/12/2024 10:13 PM  For on call review [email or web address].      [1] No Known Allergies  "

## 2024-04-12 NOTE — ED Provider Notes (Signed)
 " [place] EMERGENCY DEPARTMENT AT [facility] Provider Note   CSN: [id number] Arrival date & time: 04/12/24  1247     Patient presents with: Fall and Altered Mental Status   Jamie Ewing is a 84 y.o. female.  Past Medical History:  Diagnosis Date   Back pain    Cancer (HCC)    mult myeloma 2019   History of radiation therapy    RIght orbit and left chest- 10/19/23-11/06/23- Dr. Lynwood Nasuti   Hypercholesteremia    Hypertension    Recurrent multiple myeloma of bone marrow with unknown EBV status (HCC)      Fall  Altered Mental Status Presenting symptoms: confusion    84 year old with history of multiple myeloma presenting to the ED for confusion and back pain.  Per patient's daughter patient has been more confused than normal for the past five days. Not always oriented to time and place.  They endorse patient has fallen twice in the last week and believes she has hit her head both times.  Patient was down for unknown amount of time.  Patient's fall was unwitnessed.  Patient's family believes she hit her head.  Normally walks with a walker but family believes that she lost her balance.  Patient started having back pain after falls patient is on aspirin.  Patient has history of dehydration and UTI.  Patient's family states she has experienced confusion in the past when experiencing dehydration or UTI.  Not currently taking in much orally.  Patient now endorsing back pain.    Denies fever, chest pain, recent illness, shortness of breath, visual disturbances, unilateral weakness.  Prior to Admission medications  Medication Sig Start Date End Date Taking? Authorizing Provider  acetaminophen  (TYLENOL ) 500 MG tablet Take 1,000 mg by mouth at bedtime. Patient taking differently: Take 1,000 mg by mouth every 4 (four) hours as needed. 10/21/19   [provider]  acyclovir  (ZOVIRAX ) 400 MG tablet Take 1 tablet (400 mg total) by mouth 2 (two) times daily. 01/29/24    Davonna Siad, MD  dexamethasone  (DECADRON ) 4 MG tablet Take 1 tablet (4 mg total) by mouth daily. Take for 2 days starting the night of chemotherapy. 01/29/24   Davonna Siad, MD  feeding supplement (ENSURE PLUS HIGH PROTEIN) LIQD Take 237 mLs by mouth 2 (two) times daily between meals. 11/28/23   Evonnie Lenis, MD  furosemide  (LASIX ) 40 MG tablet TAKE 1 TABLET BY MOUTH DAILY AS NEEDED 02/07/24   Kandala, Hyndavi, MD  lidocaine -prilocaine  (EMLA ) cream Apply to affected area once 01/29/24   Kandala, Hyndavi, MD  LORazepam  (ATIVAN ) 0.5 MG tablet Take by mouth. 01/03/24   [provider]  LOW-DOSE ASPIRIN PO Take 81 mg by mouth at bedtime.    [provider]  megestrol  (MEGACE ) 400 MG/10ML suspension Take 10 mLs (400 mg total) by mouth 2 (two) times daily. 03/26/24   Kandala, Hyndavi, MD  nystatin  (MYCOSTATIN ) 100000 UNIT/ML suspension Take 5 mLs (500,000 Units total) by mouth 4 (four) times daily. Swish and spit 01/29/24   Davonna Siad, MD  ondansetron  (ZOFRAN ) 8 MG tablet Take 1 tablet (8 mg total) by mouth every 8 (eight) hours as needed for nausea or vomiting. 01/29/24   Davonna Siad, MD  PARoxetine  (PAXIL ) 10 MG tablet Take 10 mg by mouth daily. 02/19/24   [provider]  potassium chloride  SA (KLOR-CON  M) 20 MEQ tablet Take 20 mEq by mouth 2 (two) times daily.    [provider]  prochlorperazine  (  COMPAZINE ) 10 MG tablet Take 1 tablet (10 mg total) by mouth every 6 (six) hours as needed for nausea or vomiting. 01/29/24   Kandala, Hyndavi, MD    Allergies: Patient has no known allergies.    Review of Systems  Musculoskeletal:  Positive for back pain.  Psychiatric/Behavioral:  Positive for confusion.     Updated Vital Signs BP (!) 157/115 (BP Location: Right Arm)   Pulse (!) 118   Temp 98 F (36.7 C) (Oral)   Resp 14   SpO2 100%   Physical Exam Vitals and nursing note reviewed.  Constitutional:      General: She is not in acute distress.     Appearance: She is well-developed.  HENT:     Head: Normocephalic and atraumatic.  Eyes:     Conjunctiva/sclera: Conjunctivae normal.  Cardiovascular:     Rate and Rhythm: Normal rate and regular rhythm.     Heart sounds: No murmur heard. Pulmonary:     Effort: Pulmonary effort is normal. No respiratory distress.     Breath sounds: Normal breath sounds.  Abdominal:     Palpations: Abdomen is soft.     Tenderness: There is no abdominal tenderness.  Musculoskeletal:        General: Tenderness (TTP of thoracic vertebrae) present. No swelling.     Cervical back: Neck supple.  Skin:    General: Skin is warm and dry.     Capillary Refill: Capillary refill takes less than 2 seconds.  Neurological:     General: No focal deficit present.     Mental Status: She is alert. She is disoriented.     Cranial Nerves: No cranial nerve deficit.     Motor: Weakness present.  Psychiatric:        Mood and Affect: Mood normal.     (all labs ordered are listed, but only abnormal results are displayed) Labs Reviewed  CBC WITH DIFFERENTIAL/PLATELET - Abnormal; Notable for the following components:      Result Value   RBC 3.45 (*)    Hemoglobin 11.3 (*)    HCT 33.5 (*)    All other components within normal limits  COMPREHENSIVE METABOLIC PANEL WITH GFR - Abnormal; Notable for the following components:   CO2 19 (*)    Glucose, Bld 106 (*)    Anion gap 19 (*)    All other components within normal limits  URINALYSIS, ROUTINE W REFLEX MICROSCOPIC - Abnormal; Notable for the following components:   APPearance HAZY (*)    Ketones, ur 5 (*)    Protein, ur 30 (*)    Leukocytes,Ua MODERATE (*)    All other components within normal limits  CULTURE, BLOOD (ROUTINE X 2)  CULTURE, BLOOD (ROUTINE X 2)  URINE CULTURE  MAGNESIUM   CK  LACTIC ACID, PLASMA  LACTIC ACID, PLASMA    EKG: EKG Interpretation Date/Time:  Friday April 12 2024 13:39:37 EST Ventricular Rate:  115 PR Interval:  120 QRS  Duration:  66 QT Interval:  346 QTC Calculation: 478 R Axis:   14  Text Interpretation: Sinus tachycardia with Premature atrial complexes Nonspecific ST and T wave abnormality  similar to Oct 2025 Confirmed by Freddi Hamilton [phone]) on 04/12/2024 7:56:52 PM  Radiology: CT Cervical Spine Wo Contrast Result Date: 04/12/2024 EXAM: CT CERVICAL SPINE WITHOUT CONTRAST 04/12/2024 03:27:23 PM TECHNIQUE: CT of the cervical spine was performed without the administration of intravenous contrast. Multiplanar reformatted images are provided for review. Automated exposure control, iterative reconstruction, and/or  weight based adjustment of the mA/kV was utilized to reduce the radiation dose to as low as reasonably achievable. COMPARISON: MRI 09/07/2022 and radiographs 12/25/2016. CLINICAL HISTORY: Fall, neck trauma, altered mental status, multiple myeloma. FINDINGS: BONES AND ALIGNMENT: Spurring at the anterior C1-C2 articulation. Mild reversal of the normal cervical lordosis centered at C5. Scattered lucent lesions in the cervical spine most confluent at C5 but also present at all other levels, favoring multiple myeloma. 2 mm degenerative anterolisthesis at T1-T2, unchanged from 09/07/2022. No cervical spine fracture or acute subluxation identified. DEGENERATIVE CHANGES: Cervical spondylosis contributes to right foraminal impingement at C2-C3, C3-C4, C4-C5, and C5-C6; and left foraminal impingement at C3-C4, C4-C5, C5-C6, and C6-C7. SOFT TISSUES: Bilateral carotid atheromatous vascular calcifications.  check report for structure not belonging to this anatomic region . No prevertebral soft tissue swelling. IMPRESSION: 1. No acute cervical spine fracture or subluxation. 2. Scattered lucent lesions throughout the cervical spine, most confluent at C5, favoring multiple myeloma. 3. Cervical spondylosis with right foraminal impingement at C2-3, C3-4, C4-5, and C5-6, and left foraminal impingement at C3-4, C4-5, C5-6, and  C6-7. 4. Degenerative changes including anterior spurring at the C1-2 articulation and mild reversal of the normal cervical lordosis centered at C5. 5. Chronic 2 mm degenerative anterolisthesis at T1-2, unchanged. 6. Bilateral carotid atheromatous calcifications. Electronically signed by: Ryan Salvage MD 04/12/2024 03:36 PM EST RP Workstation: [id number]   DG Thoracic Spine 2 View Result Date: 04/12/2024 CLINICAL DATA:  Fall EXAM: THORACIC SPINE 2 VIEWS COMPARISON:  CT 11/26/2023, 10/26/2022 FINDINGS: Posterior spinal fusion hardware T8 through T11, stable in appearance. Chronic compression fracture T10. Stable mild compression deformity at T7. Incompletely visualized compression deformity at L3 on L4. Stable alignment and no definite new osseous abnormality. Metallic density overlying the left lower chest and left lower paraspinal region on frontal view. Metallic density overlying the spinous process of T12 on the lateral view. Known myeloma lytic lesions on CT are not well seen radiographically. IMPRESSION: 1. Posterior spinal fusion hardware T8 through T11 with chronic compression fracture at T10. No definite acute osseous abnormality. Multiple chronic compression deformities at T7, L3 and L4. 2. Metallic densities overlying the left lower chest, left paraspinal region and posterior spinal region as above, correlate for external artifact Electronically Signed   By: Luke Bun M.D.   On: 04/12/2024 15:31   CT Head Wo Contrast Result Date: 04/12/2024 EXAM: CT HEAD WITHOUT CONTRAST 04/12/2024 02:32:41 PM TECHNIQUE: CT of the head was performed without the administration of intravenous contrast. Automated exposure control, iterative reconstruction, and/or weight based adjustment of the mA/kV was utilized to reduce the radiation dose to as low as reasonably achievable. COMPARISON: CT head 01/10/2024. CLINICAL HISTORY: Mental status change, unknown cause FINDINGS: BRAIN AND VENTRICLES: No acute  hemorrhage. No evidence of acute infarct. No hydrocephalus. No extra-axial collection. No mass effect or midline shift. ORBITS: No acute abnormality. SINUSES: No acute abnormality. SOFT TISSUES AND SKULL: No acute soft tissue abnormality. No skull fracture. IMPRESSION: 1. No acute intracranial abnormality. Electronically signed by: Glendia Molt MD 04/12/2024 03:02 PM EST RP Workstation: [id number]     Procedures   Medications Ordered in the ED  0.9 %  sodium chloride  infusion ( Intravenous New Bag/Given 04/12/24 2048)  lactated ringers  bolus 1,000 mL (1,000 mLs Intravenous Bolus 04/12/24 1607)  lactated ringers  bolus 500 mL (0 mLs Intravenous Stopped 04/12/24 2042)  cefTRIAXone  (ROCEPHIN ) 1 g in sodium chloride  0.9 % 100 mL IVPB (0 g Intravenous Stopped 04/12/24 2133)  LORazepam  (  ATIVAN ) tablet 0.5 mg (0.5 mg Oral Given 04/12/24 2208)                                     Medical Decision Making Amount and/or Complexity of Data Reviewed Labs: ordered. Radiology: ordered.  Risk Prescription drug management. Decision regarding hospitalization.   This patient presents to the ED for concern of confusion and back pain, this involves an extensive number of treatment options, and is a complaint that carries with it a high risk of complications and morbidity.  The differential diagnosis includes dehydration versus UTI versus intracranial bleed versus rhabdomyolysis versus CVA versus other infection.   Co morbidities that complicate the patient evaluation  Patient has some good support. Some confusion at baseline.   Additional history obtained:  Additional history obtained from daughter and family members   Lab Tests:  I Ordered, and personally interpreted labs.  The pertinent results include: No leukocytosis, stable hemoglobin, normal CK. Anion gap of 19   Imaging Studies ordered:  I ordered imaging studies including CT cervical spine, CT head, x-ray of thoracic spine I independently  visualized and interpreted imaging which showed no evidence of intracranial abnormalities, no acute fractures.  Chronic degenerative findings and evidence of known multiple myeloma present. I agree with the radiologist interpretation   Cardiac Monitoring:  The patient was maintained on a cardiac monitor.  I personally viewed and interpreted the cardiac monitored which showed an underlying rhythm of: Sinus tachycardia with PACs   Medicines ordered and prescription drug management:  I ordered medication including lactated ringer  for dehydration, Ativan  for anxiety Reevaluation of the patient after these medicines showed that the patient improved I have reviewed the patients home medicines and have made adjustments as needed  Consults Patient discussed with Dr. Freddi who also evaluated patient   Problem List / ED Course:  Patient presented to ED with altered mental status and back pain for the last 4 days concerning for dehydration.  Patient has had 2 falls in the last week, unknown if she hit her head or lost consciousness.Differential diagnosis included but not limited to UTI, dehydration, CVA, intracranial bleed, other illness.  Workup included CT head and C-spine, thoracic x-ray.  Also included [facility] CBC, CK, UA, lactic acid, blood cultures.  Imaging did not show any acute pathology.  UA showed moderate leukocytes, no nitrates or bacteria.  Patient given fluids in ED with some improvement.  Patient remained tachycardic but afebrile, no leukocytosis, not meeting sepsis criteria.  Given some lab work abnormalities patient to be admitted to medicine for further workup with empiric treatment for UTI.   Reevaluation:  After the interventions noted above, I reevaluated the patient and found that they have :improved   Social Determinants of Health:  Patient has good support from family   Dispostion:  After consideration of the diagnostic results and the patients response to treatment,  I feel that the patent would benefit from admission for further workup and observation.       Final diagnoses:  Confusion  Dehydration    ED Discharge Orders     None          Harold Tillman ONEIDA DEVONNA 04/12/24 2318    Freddi Hamilton, MD 04/12/24 2357  "

## 2024-04-12 NOTE — ED Triage Notes (Signed)
 Pt comes in for AMS and falls. Family noticed changes in the pt's AMS for the past 4 days. Family wonders in if its dehydration.  Pt has been throwing last week.   Pt is lethargic and slurring her words.  Pt is not eating/drink like she is supposed to.  Pt is only aware of her name.  Hx of myeloma.  Last txt 03/04/2024 Last time pt went to her txt on 1/6, pt needed IV fluids.  Pt is oriented place, and person.

## 2024-04-12 NOTE — Discharge Instructions (Addendum)
 Today you are seen in the emergency room for confusion and back pain.  Your exam was reassuring that there is nothing emergent that needed to be treated today.  He received some fluids for dehydration.

## 2024-04-12 NOTE — ED Provider Triage Note (Signed)
 Emergency Medicine Provider Triage Evaluation Note  DHRITHI RICHE , a 84 y.o. female  was evaluated in triage.  Pt complains of confusion and back pain.  History taken from patient's daughter and granddaughter.  Patient has been more confused than usual over the last 5 days.  Not always oriented to time and place.  They endorse patient has fallen twice in the last week and believes she has hit her head both times.  Patient was down for unknown amount of time.  Patient is on aspirin.  Patient has history of dehydration.  Not currently taking in much orally.  Patient now endorsing back pain.  Review of Systems  Positive: Confusion and back pain Negative: Chest pain or shortness of breath  Physical Exam  BP (!) 107/56 (BP Location: Right Arm)   Pulse (!) 115   Temp 97.8 F (36.6 C)   Resp 18   SpO2 100%  Gen:   Awake, no distress   Resp:  Normal effort  MSK:   Moves extremities without difficulty  Other:  Tenderness to palpation on thoracic vertebrae  Medical Decision Making  Medically screening exam initiated at 2:39 PM.  Appropriate orders placed.  Berkley D Bale was informed that the remainder of the evaluation will be completed by another provider, this initial triage assessment does not replace that evaluation, and the importance of remaining in the ED until their evaluation is complete.     Harold Tillman DASEN, PA-C 04/12/24 1443

## 2024-04-12 NOTE — Telephone Encounter (Signed)
 VM received regarding patient seeming dehydrated. Unable to reach family member back, left detailed VM encouraging family member to have patient evaluated in ED.

## 2024-04-13 ENCOUNTER — Encounter (HOSPITAL_COMMUNITY): Payer: Self-pay | Admitting: Internal Medicine

## 2024-04-13 DIAGNOSIS — N3000 Acute cystitis without hematuria: Secondary | ICD-10-CM

## 2024-04-13 LAB — COMPREHENSIVE METABOLIC PANEL WITH GFR
ALT: 16 U/L (ref 0–44)
AST: 38 U/L (ref 15–41)
Albumin: 3.2 g/dL — ABNORMAL LOW (ref 3.5–5.0)
Alkaline Phosphatase: 68 U/L (ref 38–126)
Anion gap: 12 (ref 5–15)
BUN: 6 mg/dL — ABNORMAL LOW (ref 8–23)
CO2: 20 mmol/L — ABNORMAL LOW (ref 22–32)
Calcium: 8.9 mg/dL (ref 8.9–10.3)
Chloride: 106 mmol/L (ref 98–111)
Creatinine, Ser: 0.55 mg/dL (ref 0.44–1.00)
GFR, Estimated: >60 mL/min
Glucose, Bld: 89 mg/dL (ref 70–99)
Potassium: 3.5 mmol/L (ref 3.5–5.1)
Sodium: 138 mmol/L (ref 135–145)
Total Bilirubin: 0.5 mg/dL (ref 0.0–1.2)
Total Protein: 5.7 g/dL — ABNORMAL LOW (ref 6.5–8.1)

## 2024-04-13 LAB — MAGNESIUM: Magnesium: 1.8 mg/dL (ref 1.7–2.4)

## 2024-04-13 LAB — URINE CULTURE: Culture: <10000 — AB

## 2024-04-13 LAB — CBC
HCT: 29.3 % — ABNORMAL LOW (ref 36.0–46.0)
Hemoglobin: 9.8 g/dL — ABNORMAL LOW (ref 12.0–15.0)
MCH: 32.8 pg (ref 26.0–34.0)
MCHC: 33.4 g/dL (ref 30.0–36.0)
MCV: 98 fL (ref 80.0–100.0)
Platelets: 185 10*3/uL (ref 150–400)
RBC: 2.99 MIL/uL — ABNORMAL LOW (ref 3.87–5.11)
RDW: 14.1 % (ref 11.5–15.5)
WBC: 6.3 10*3/uL (ref 4.0–10.5)
nRBC: 0 % (ref 0.0–0.2)

## 2024-04-13 LAB — LACTIC ACID, PLASMA: Lactic Acid, Venous: 1.7 mmol/L (ref 0.5–1.9)

## 2024-04-13 MED ORDER — LORAZEPAM 0.5 MG PO TABS
0.5000 mg | ORAL_TABLET | Freq: Once | ORAL | Status: AC
  Administered 2024-04-13: 0.5 mg via ORAL
  Filled 2024-04-13: qty 1

## 2024-04-13 MED ORDER — ACETAMINOPHEN 325 MG PO TABS: 650.0000 mg | ORAL_TABLET | Freq: Four times a day (QID) | ORAL | Status: DC | PRN

## 2024-04-13 MED ORDER — ACETAMINOPHEN 650 MG RE SUPP: 650.0000 mg | Freq: Four times a day (QID) | RECTAL | Status: DC | PRN

## 2024-04-13 MED ORDER — SODIUM CHLORIDE 0.9 % IV SOLN
1.0000 g | INTRAVENOUS | Status: DC
  Administered 2024-04-13 – 2024-04-14 (×2): 1 g via INTRAVENOUS
  Filled 2024-04-13 (×2): qty 10

## 2024-04-13 MED ORDER — ENOXAPARIN SODIUM 40 MG/0.4ML IJ SOSY
40.0000 mg | PREFILLED_SYRINGE | INTRAMUSCULAR | Status: DC
  Administered 2024-04-13 – 2024-04-17 (×5): 40 mg via SUBCUTANEOUS
  Filled 2024-04-13 (×5): qty 0.4

## 2024-04-13 MED ORDER — ONDANSETRON HCL 4 MG PO TABS: 4.0000 mg | ORAL_TABLET | Freq: Four times a day (QID) | ORAL | Status: DC | PRN

## 2024-04-13 MED ORDER — ONDANSETRON HCL 4 MG/2ML IJ SOLN
4.0000 mg | Freq: Four times a day (QID) | INTRAMUSCULAR | Status: DC | PRN
  Administered 2024-04-14: 4 mg via INTRAVENOUS
  Filled 2024-04-13: qty 2

## 2024-04-13 NOTE — Progress Notes (Signed)
" °   04/13/24 0245  Assess: MEWS Score  Temp 98.2 F (36.8 C)  BP (!) 128/57  MAP (mmHg) 78  Pulse Rate (!) 112  Resp 16  SpO2 100 %  O2 Device Room Air  Assess: MEWS Score  MEWS Temp 0  MEWS Systolic 0  MEWS Pulse 2  MEWS RR 0  MEWS LOC 0  MEWS Score 2  MEWS Score Color Yellow  Assess: if the MEWS score is Yellow or Red  Were vital signs accurate and taken at a resting state? Yes  Does the patient meet 2 or more of the SIRS criteria? Yes  Does the patient have a confirmed or suspected source of infection? Yes  MEWS guidelines implemented  No, previously yellow, continue vital signs every 4 hours  Notify: Charge Nurse/RN  Name of Charge Nurse/RN Notified Ayo Smoak.Caria Transue, RN  Assess: SIRS CRITERIA  SIRS Temperature  0  SIRS Respirations  0  SIRS Pulse 1  SIRS WBC 0  SIRS Score Sum  1    "

## 2024-04-13 NOTE — Plan of Care (Signed)

## 2024-04-13 NOTE — Progress Notes (Addendum)
 "    Progress Note    Jamie Ewing  FMW:[id number] DOB: [birth year]  DOA: 04/12/2024 PCP: Sheryle Carwin, MD      Brief Narrative:    Medical records reviewed and are as summarized below:  Jamie Ewing is a 84 y.o. female  with medical history significant of hypertension, multiple myeloma, peripheral neuropathy, aortic valve stenosis, who presented to the hospital because of back pain and worsening confusion for about 5 days duration.  Reportedly, she is hard to falls within the past week preceding admission.  Reportedly, she had altered mental status in the past when she had UTI/dehydration.  Vital signs in the ED: Temperature 97.8 F, respiratory rate 18, pulse 115, BP 117/56, oxygen saturation 100% on room air.      Assessment/Plan:   Principal Problem:   UTI (urinary tract infection)    Body mass index is 21.43 kg/m.    Acute UTI: Continue IV ceftriaxone .  Follow-up urine and blood cultures.   Acute metabolic encephalopathy: Improved.  Patient appears to be at baseline.   General weakness, failure to thrive, s/p falls at home: PT and OT evaluation   Comorbidities include moderate aortic valve stenosis, multiple myeloma, peripheral neuropathy, hypertension     Diet Order             Diet Heart Room service appropriate? Yes; Fluid consistency: Thin  Diet effective now                                  Consultants: None  Procedures: None    Medications:    enoxaparin  (LOVENOX ) injection  40 mg Subcutaneous Q24H   Continuous Infusions:  sodium chloride  125 mL/hr at 04/12/24 2048   cefTRIAXone  (ROCEPHIN )  IV       Anti-infectives (From admission, onward)    Start     Dose/Rate Route Frequency Ordered Stop   04/13/24 1800  cefTRIAXone  (ROCEPHIN ) 1 g in sodium chloride  0.9 % 100 mL IVPB        1 g 200 mL/hr over 30 Minutes Intravenous Every 24 hours 04/13/24 0629     04/12/24 2015  cefTRIAXone  (ROCEPHIN ) 1 g in sodium  chloride 0.9 % 100 mL IVPB        1 g 200 mL/hr over 30 Minutes Intravenous  Once 04/12/24 2009 04/12/24 2133              Family Communication/Anticipated D/C date and plan/Code Status   DVT prophylaxis: enoxaparin  (LOVENOX ) injection 40 mg Start: 04/13/24 1000 SCDs Start: 04/13/24 9471     Code Status: Full Code  Family Communication: Plan discussed with Marval, friend and neighbor, at the bedside with patient's permission Disposition Plan: Plan to discharge home versus SNF   Status is: Inpatient Remains inpatient appropriate because: Acute UTI       Subjective:   Interval events noted.  She complains of general weakness and poor appetite. Debbie, friend at bedside.  Patient said it was okay for Debbie to stay at the bedside.  Objective:    Vitals:   04/12/24 2254 04/12/24 2300 04/13/24 0245 04/13/24 0807  BP: (!) 157/115  (!) 128/57 131/66  Pulse: (!) 118 (!) 104 (!) 112 (!) 105  Resp:   16 18  Temp: 98 F (36.7 C)  98.2 F (36.8 C) 97.9 F (36.6 C)  TempSrc: Oral  Oral Oral  SpO2: 100%  100% 100%  Weight:  54.9 kg    Height:  5' 3 (1.6 m)     No data found.   Intake/Output Summary (Last 24 hours) at 04/13/2024 9071 Last data filed at 04/13/2024 0600 Gross per 24 hour  Intake 1379.41 ml  Output --  Net 1379.41 ml   Filed Weights   04/12/24 2300  Weight: 54.9 kg    Exam:  GEN: NAD SKIN: Warm and dry EYES: No pallor or icterus ENT: MMM CV: RRR, systolic murmur loudest in the aortic area PULM: CTA B ABD: soft, ND, NT, +BS CNS: AAO x 2 (person and place), non focal EXT: No edema or tenderness        Data Reviewed:   I have personally reviewed following labs and imaging studies:  Labs: Labs show the following:   Basic Metabolic Panel: Recent Labs  Lab 04/12/24 1503 04/13/24 0618  NA 136 138  K 4.3 3.5  CL 98 106  CO2 19* 20*  GLUCOSE 106* 89  BUN 10 6*  CREATININE 0.67 0.55  CALCIUM 10.0 8.9  MG 2.0 1.8    GFR Estimated Creatinine Clearance: 44.8 mL/min (by C-G formula based on SCr of 0.55 mg/dL). Liver Function Tests: Recent Labs  Lab 04/12/24 1503 04/13/24 0618  AST 26 38  ALT 12 16  ALKPHOS 57 68  BILITOT 0.6 0.5  PROT 6.8 5.7*  ALBUMIN  3.7 3.2*   No results for input(s): LIPASE, AMYLASE in the last 168 hours. No results for input(s): AMMONIA in the last 168 hours. Coagulation profile No results for input(s): INR, PROTIME in the last 168 hours.  CBC: Recent Labs  Lab 04/12/24 1503 04/13/24 0618  WBC 7.5 6.3  NEUTROABS 5.5  --   HGB 11.3* 9.8*  HCT 33.5* 29.3*  MCV 97.1 98.0  PLT 246 185   Cardiac Enzymes: Recent Labs  Lab 04/12/24 1504  CKTOTAL 75   BNP (last 3 results) No results for input(s): PROBNP in the last 8760 hours. CBG: No results for input(s): GLUCAP in the last 168 hours. D-Dimer: No results for input(s): DDIMER in the last 72 hours. Hgb A1c: No results for input(s): HGBA1C in the last 72 hours. Lipid Profile: No results for input(s): CHOL, HDL, LDLCALC, TRIG, CHOLHDL, LDLDIRECT in the last 72 hours. Thyroid  function studies: No results for input(s): TSH, T4TOTAL, T3FREE, THYROIDAB in the last 72 hours.  Invalid input(s): FREET3 Anemia work up: No results for input(s): VITAMINB12, FOLATE, FERRITIN, TIBC, IRON, RETICCTPCT in the last 72 hours. Sepsis Labs: Recent Labs  Lab 04/12/24 1503 04/12/24 2035 04/12/24 2321 04/13/24 0618  WBC 7.5  --   --  6.3  LATICACIDVEN  --  1.9 1.7  --     Microbiology Recent Results (from the past 240 hours)  Culture, blood (routine x 2)     Status: None (Preliminary result)   Collection Time: 04/12/24  8:35 PM   Specimen: Right Antecubital; Blood  Result Value Ref Range Status   Specimen Description RIGHT ANTECUBITAL  Final   Special Requests   Final    BOTTLES DRAWN AEROBIC AND ANAEROBIC Blood Culture adequate volume   Culture   Final    NO  GROWTH < 12 HOURS Performed at [facility], [address] [place], [place] [address]    Report Status PENDING  Incomplete  Culture, blood (routine x 2)     Status: None (Preliminary result)   Collection Time: 04/12/24  8:39 PM   Specimen: Left Antecubital; Blood  Result Value Ref  Range Status   Specimen Description LEFT ANTECUBITAL  Final   Special Requests   Final    BOTTLES DRAWN AEROBIC AND ANAEROBIC Blood Culture adequate volume   Culture   Final    NO GROWTH < 12 HOURS Performed at [facility], [address] [place], [place] [address]    Report Status PENDING  Incomplete    Procedures and diagnostic studies:  CT Cervical Spine Wo Contrast Result Date: 04/12/2024 EXAM: CT CERVICAL SPINE WITHOUT CONTRAST 04/12/2024 03:27:23 PM TECHNIQUE: CT of the cervical spine was performed without the administration of intravenous contrast. Multiplanar reformatted images are provided for review. Automated exposure control, iterative reconstruction, and/or weight based adjustment of the mA/kV was utilized to reduce the radiation dose to as low as reasonably achievable. COMPARISON: MRI 09/07/2022 and radiographs 12/25/2016. CLINICAL HISTORY: Fall, neck trauma, altered mental status, multiple myeloma. FINDINGS: BONES AND ALIGNMENT: Spurring at the anterior C1-C2 articulation. Mild reversal of the normal cervical lordosis centered at C5. Scattered lucent lesions in the cervical spine most confluent at C5 but also present at all other levels, favoring multiple myeloma. 2 mm degenerative anterolisthesis at T1-T2, unchanged from 09/07/2022. No cervical spine fracture or acute subluxation identified. DEGENERATIVE CHANGES: Cervical spondylosis contributes to right foraminal impingement at C2-C3, C3-C4, C4-C5, and C5-C6; and left foraminal impingement at C3-C4, C4-C5, C5-C6, and C6-C7. SOFT TISSUES: Bilateral carotid atheromatous vascular calcifications. ** check report for structure not belonging to this  anatomic region **. No prevertebral soft tissue swelling. IMPRESSION: 1. No acute cervical spine fracture or subluxation. 2. Scattered lucent lesions throughout the cervical spine, most confluent at C5, favoring multiple myeloma. 3. Cervical spondylosis with right foraminal impingement at C2-3, C3-4, C4-5, and C5-6, and left foraminal impingement at C3-4, C4-5, C5-6, and C6-7. 4. Degenerative changes including anterior spurring at the C1-2 articulation and mild reversal of the normal cervical lordosis centered at C5. 5. Chronic 2 mm degenerative anterolisthesis at T1-2, unchanged. 6. Bilateral carotid atheromatous calcifications. Electronically signed by: Ryan Salvage MD 04/12/2024 03:36 PM EST RP Workstation: [id number]   DG Thoracic Spine 2 View Result Date: 04/12/2024 CLINICAL DATA:  Fall EXAM: THORACIC SPINE 2 VIEWS COMPARISON:  CT 11/26/2023, 10/26/2022 FINDINGS: Posterior spinal fusion hardware T8 through T11, stable in appearance. Chronic compression fracture T10. Stable mild compression deformity at T7. Incompletely visualized compression deformity at L3 on L4. Stable alignment and no definite new osseous abnormality. Metallic density overlying the left lower chest and left lower paraspinal region on frontal view. Metallic density overlying the spinous process of T12 on the lateral view. Known myeloma lytic lesions on CT are not well seen radiographically. IMPRESSION: 1. Posterior spinal fusion hardware T8 through T11 with chronic compression fracture at T10. No definite acute osseous abnormality. Multiple chronic compression deformities at T7, L3 and L4. 2. Metallic densities overlying the left lower chest, left paraspinal region and posterior spinal region as above, correlate for external artifact Electronically Signed   By: Luke Bun M.D.   On: 04/12/2024 15:31   CT Head Wo Contrast Result Date: 04/12/2024 EXAM: CT HEAD WITHOUT CONTRAST 04/12/2024 02:32:41 PM TECHNIQUE: CT of the head was  performed without the administration of intravenous contrast. Automated exposure control, iterative reconstruction, and/or weight based adjustment of the mA/kV was utilized to reduce the radiation dose to as low as reasonably achievable. COMPARISON: CT head 01/10/2024. CLINICAL HISTORY: Mental status change, unknown cause FINDINGS: BRAIN AND VENTRICLES: No acute hemorrhage. No evidence of acute infarct. No hydrocephalus. No extra-axial collection. No  mass effect or midline shift. ORBITS: No acute abnormality. SINUSES: No acute abnormality. SOFT TISSUES AND SKULL: No acute soft tissue abnormality. No skull fracture. IMPRESSION: 1. No acute intracranial abnormality. Electronically signed by: Glendia Molt MD 04/12/2024 03:02 PM EST RP Workstation: [id number]               LOS: 1 day   Omair Dettmer  [facility]   Pager on [email or web address]. If 7PM-7AM, please contact night-coverage at [email or web address]     04/13/2024, 9:28 AM           "

## 2024-04-13 NOTE — Progress Notes (Signed)
" °   04/12/24 2254  Assess: MEWS Score  Temp 98 F (36.7 C)  BP (!) 157/115  MAP (mmHg) 130  Pulse Rate (!) 118  SpO2 100 %  O2 Device Room Air  Assess: MEWS Score  MEWS Temp 0  MEWS Systolic 0  MEWS Pulse 2  MEWS RR 0  MEWS LOC 0  MEWS Score 2  MEWS Score Color Yellow  Assess: if the MEWS score is Yellow or Red  Were vital signs accurate and taken at a resting state? No, vital signs rechecked  Does the patient meet 2 or more of the SIRS criteria? Yes  Does the patient have a confirmed or suspected source of infection? Yes  MEWS guidelines implemented  Yes, yellow  Treat  MEWS Interventions Considered administering scheduled or prn medications/treatments as ordered  Take Vital Signs  Increase Vital Sign Frequency  Yellow: Q2hr x1, continue Q4hrs until patient remains green for 12hrs  Escalate  MEWS: Escalate Yellow: Discuss with charge nurse and consider notifying provider and/or RRT  Notify: Charge Nurse/RN  Name of Charge Nurse/RN Notified Metta Sar, RN  Assess: SIRS CRITERIA  SIRS Temperature  0  SIRS Respirations  0  SIRS Pulse 1  SIRS WBC 0  SIRS Score Sum  1   Patient's heart rate was elevated. She had just moved from the stretcher to the bed  "

## 2024-04-13 NOTE — Plan of Care (Signed)
  Problem: Education: Goal: Knowledge of General Education information will improve Description: Including pain rating scale, medication(s)/side effects and non-pharmacologic comfort measures Outcome: Progressing   Problem: Health Behavior/Discharge Planning: Goal: Ability to manage health-related needs will improve Outcome: Progressing   Problem: Clinical Measurements: Goal: Ability to maintain clinical measurements within normal limits will improve Outcome: Progressing   Problem: Clinical Measurements: Goal: Will remain free from infection Outcome: Not Progressing

## 2024-04-13 NOTE — Progress Notes (Signed)
 Patient is very weak. Takes max assist to pivot to [facility] from the bed.

## 2024-04-14 DIAGNOSIS — N3000 Acute cystitis without hematuria: Secondary | ICD-10-CM | POA: Diagnosis not present

## 2024-04-14 LAB — PHOSPHORUS: Phosphorus: 3 mg/dL (ref 2.5–4.6)

## 2024-04-14 MED ORDER — MELATONIN 3 MG PO TABS
6.0000 mg | ORAL_TABLET | Freq: Once | ORAL | Status: AC
  Administered 2024-04-14: 6 mg via ORAL
  Filled 2024-04-14: qty 2

## 2024-04-14 MED ORDER — PAROXETINE HCL 10 MG PO TABS
10.0000 mg | ORAL_TABLET | Freq: Every day | ORAL | Status: DC
  Administered 2024-04-14 – 2024-04-17 (×4): 10 mg via ORAL
  Filled 2024-04-14 (×4): qty 1

## 2024-04-14 MED ORDER — MEGESTROL ACETATE 400 MG/10ML PO SUSP
400.0000 mg | Freq: Two times a day (BID) | ORAL | Status: DC
  Administered 2024-04-14: 400 mg via ORAL
  Filled 2024-04-14 (×2): qty 10

## 2024-04-14 MED ORDER — MELATONIN 3 MG PO TABS: 3.0000 mg | ORAL_TABLET | Freq: Once | ORAL | Status: DC

## 2024-04-14 NOTE — Plan of Care (Signed)

## 2024-04-14 NOTE — Progress Notes (Signed)
 "    Progress Note    Jamie Ewing  FMW:[id number] DOB: [birth year]  DOA: 04/12/2024 PCP: Sheryle Carwin, MD      Brief Narrative:    Medical records reviewed and are as summarized below:  Jamie Ewing is a 84 y.o. female  with medical history significant of hypertension, multiple myeloma, peripheral neuropathy, aortic valve stenosis, who presented to the hospital because of back pain and worsening confusion for about 5 days duration.  Reportedly, she is hard to falls within the past week preceding admission.  Reportedly, she had altered mental status in the past when she had UTI/dehydration.  Vital signs in the ED: Temperature 97.8 F, respiratory rate 18, pulse 115, BP 117/56, oxygen saturation 100% on room air.      Assessment/Plan:   Principal Problem:   UTI (urinary tract infection)    Body mass index is 21.43 kg/m.    Acute UTI: Continue IV ceftriaxone .  No growth on blood cultures. Urine culture dis not show any significant growth.    Acute metabolic encephalopathy: Improved.  Patient appears to be at baseline.   General weakness, failure to thrive, s/p falls at home: PT and OT evaluation pending. She'll likely need rehab at SNF   Comorbidities include moderate aortic valve stenosis, multiple myeloma, peripheral neuropathy, hypertension     Diet Order             Diet Heart Room service appropriate? Yes; Fluid consistency: Thin  Diet effective now                                  Consultants: None  Procedures: None    Medications:    enoxaparin  (LOVENOX ) injection  40 mg Subcutaneous Q24H   megestrol   400 mg Oral BID   PARoxetine   10 mg Oral Daily   Continuous Infusions:  cefTRIAXone  (ROCEPHIN )  IV 1 g (04/13/24 1752)     Anti-infectives (From admission, onward)    Start     Dose/Rate Route Frequency Ordered Stop   04/13/24 1800  cefTRIAXone  (ROCEPHIN ) 1 g in sodium chloride  0.9 % 100 mL IVPB        1 g 200 mL/hr  over 30 Minutes Intravenous Every 24 hours 04/13/24 0629     04/12/24 2015  cefTRIAXone  (ROCEPHIN ) 1 g in sodium chloride  0.9 % 100 mL IVPB        1 g 200 mL/hr over 30 Minutes Intravenous  Once 04/12/24 2009 04/12/24 2133              Family Communication/Anticipated D/C date and plan/Code Status   DVT prophylaxis: enoxaparin  (LOVENOX ) injection 40 mg Start: 04/13/24 1000 SCDs Start: 04/13/24 9471     Code Status: Full Code  Family Communication: Plan discussed with Marval, friend and neighbor, at the bedside with patient's permission Disposition Plan: Plan to discharge home versus SNF   Status is: Inpatient Remains inpatient appropriate because: Acute UTI       Subjective:   Interval events noted. No complaints. She feels better   Objective:    Vitals:   04/13/24 1408 04/13/24 1929 04/14/24 0422 04/14/24 0500  BP: 133/62 (!) 142/68 (!) 149/100 (!) 148/80  Pulse: 100 (!) 101 (!) 108   Resp: 17 17 17    Temp: 98.1 F (36.7 C) 97.9 F (36.6 C) 98.5 F (36.9 C)   TempSrc: Oral Oral Oral   SpO2: 100% 100% 97%  Weight:      Height:       No data found.   Intake/Output Summary (Last 24 hours) at 04/14/2024 1526 Last data filed at 04/14/2024 0900 Gross per 24 hour  Intake 2116.62 ml  Output --  Net 2116.62 ml   Filed Weights   04/12/24 2300  Weight: 54.9 kg    Exam:  GEN: NAD SKIN: Warm and dry EYES: No pallor or icterus ENT: MMM CV: RRR, systolic murmur loudest in the aortic area PULM: CTA B ABD: soft, ND, NT, +BS CNS: AAO x 3, non focal EXT: No edema or tenderness         Data Reviewed:   I have personally reviewed following labs and imaging studies:  Labs: Labs show the following:   Basic Metabolic Panel: Recent Labs  Lab 04/12/24 1503 04/13/24 0618 04/14/24 0434  NA 136 138  --   K 4.3 3.5  --   CL 98 106  --   CO2 19* 20*  --   GLUCOSE 106* 89  --   BUN 10 6*  --   CREATININE 0.67 0.55  --   CALCIUM 10.0 8.9  --    MG 2.0 1.8  --   PHOS  --   --  3.0   GFR Estimated Creatinine Clearance: 44.8 mL/min (by C-G formula based on SCr of 0.55 mg/dL). Liver Function Tests: Recent Labs  Lab 04/12/24 1503 04/13/24 0618  AST 26 38  ALT 12 16  ALKPHOS 57 68  BILITOT 0.6 0.5  PROT 6.8 5.7*  ALBUMIN  3.7 3.2*   No results for input(s): LIPASE, AMYLASE in the last 168 hours. No results for input(s): AMMONIA in the last 168 hours. Coagulation profile No results for input(s): INR, PROTIME in the last 168 hours.  CBC: Recent Labs  Lab 04/12/24 1503 04/13/24 0618  WBC 7.5 6.3  NEUTROABS 5.5  --   HGB 11.3* 9.8*  HCT 33.5* 29.3*  MCV 97.1 98.0  PLT 246 185   Cardiac Enzymes: Recent Labs  Lab 04/12/24 1504  CKTOTAL 75   BNP (last 3 results) No results for input(s): PROBNP in the last 8760 hours. CBG: No results for input(s): GLUCAP in the last 168 hours. D-Dimer: No results for input(s): DDIMER in the last 72 hours. Hgb A1c: No results for input(s): HGBA1C in the last 72 hours. Lipid Profile: No results for input(s): CHOL, HDL, LDLCALC, TRIG, CHOLHDL, LDLDIRECT in the last 72 hours. Thyroid  function studies: No results for input(s): TSH, T4TOTAL, T3FREE, THYROIDAB in the last 72 hours.  Invalid input(s): FREET3 Anemia work up: No results for input(s): VITAMINB12, FOLATE, FERRITIN, TIBC, IRON, RETICCTPCT in the last 72 hours. Sepsis Labs: Recent Labs  Lab 04/12/24 1503 04/12/24 2035 04/12/24 2321 04/13/24 0618  WBC 7.5  --   --  6.3  LATICACIDVEN  --  1.9 1.7  --     Microbiology Recent Results (from the past 240 hours)  Urine Culture     Status: Abnormal   Collection Time: 04/12/24  5:11 PM   Specimen: Urine, Clean Catch  Result Value Ref Range Status   Specimen Description   Final    URINE, CLEAN CATCH Performed at [facility], [address] [place], [place] [address]    Special Requests   Final    NONE Performed  at [facility], [address] [place], [place] [address]    Culture (A)  Final    <10,000 COLONIES/mL INSIGNIFICANT GROWTH Performed at  [facility], [address] [place], [place] [address]    Report Status 04/13/2024 FINAL  Final  Culture, blood (routine x 2)     Status: None (Preliminary result)   Collection Time: 04/12/24  8:35 PM   Specimen: Right Antecubital; Blood  Result Value Ref Range Status   Specimen Description RIGHT ANTECUBITAL  Final   Special Requests   Final    BOTTLES DRAWN AEROBIC AND ANAEROBIC Blood Culture adequate volume   Culture   Final    NO GROWTH 2 DAYS Performed at [facility], [address] [place], [place] [address]    Report Status PENDING  Incomplete  Culture, blood (routine x 2)     Status: None (Preliminary result)   Collection Time: 04/12/24  8:39 PM   Specimen: Left Antecubital; Blood  Result Value Ref Range Status   Specimen Description LEFT ANTECUBITAL  Final   Special Requests   Final    BOTTLES DRAWN AEROBIC AND ANAEROBIC Blood Culture adequate volume   Culture   Final    NO GROWTH 2 DAYS Performed at [facility], [address] [place], [place] [address]    Report Status PENDING  Incomplete    Procedures and diagnostic studies:  CT Cervical Spine Wo Contrast Result Date: 04/12/2024 EXAM: CT CERVICAL SPINE WITHOUT CONTRAST 04/12/2024 03:27:23 PM TECHNIQUE: CT of the cervical spine was performed without the administration of intravenous contrast. Multiplanar reformatted images are provided for review. Automated exposure control, iterative reconstruction, and/or weight based adjustment of the mA/kV was utilized to reduce the radiation dose to as low as reasonably achievable. COMPARISON: MRI 09/07/2022 and radiographs 12/25/2016. CLINICAL HISTORY: Fall, neck trauma, altered mental status, multiple myeloma. FINDINGS: BONES AND ALIGNMENT: Spurring at the anterior C1-C2 articulation. Mild reversal of the normal cervical lordosis  centered at C5. Scattered lucent lesions in the cervical spine most confluent at C5 but also present at all other levels, favoring multiple myeloma. 2 mm degenerative anterolisthesis at T1-T2, unchanged from 09/07/2022. No cervical spine fracture or acute subluxation identified. DEGENERATIVE CHANGES: Cervical spondylosis contributes to right foraminal impingement at C2-C3, C3-C4, C4-C5, and C5-C6; and left foraminal impingement at C3-C4, C4-C5, C5-C6, and C6-C7. SOFT TISSUES: Bilateral carotid atheromatous vascular calcifications. ** check report for structure not belonging to this anatomic region **. No prevertebral soft tissue swelling. IMPRESSION: 1. No acute cervical spine fracture or subluxation. 2. Scattered lucent lesions throughout the cervical spine, most confluent at C5, favoring multiple myeloma. 3. Cervical spondylosis with right foraminal impingement at C2-3, C3-4, C4-5, and C5-6, and left foraminal impingement at C3-4, C4-5, C5-6, and C6-7. 4. Degenerative changes including anterior spurring at the C1-2 articulation and mild reversal of the normal cervical lordosis centered at C5. 5. Chronic 2 mm degenerative anterolisthesis at T1-2, unchanged. 6. Bilateral carotid atheromatous calcifications. Electronically signed by: Ryan Salvage MD 04/12/2024 03:36 PM EST RP Workstation: [id number]   DG Thoracic Spine 2 View Result Date: 04/12/2024 CLINICAL DATA:  Fall EXAM: THORACIC SPINE 2 VIEWS COMPARISON:  CT 11/26/2023, 10/26/2022 FINDINGS: Posterior spinal fusion hardware T8 through T11, stable in appearance. Chronic compression fracture T10. Stable mild compression deformity at T7. Incompletely visualized compression deformity at L3 on L4. Stable alignment and no definite new osseous abnormality. Metallic density overlying the left lower chest and left lower paraspinal region on frontal view. Metallic density overlying the spinous process of T12 on the lateral view. Known myeloma lytic lesions on CT  are not well seen radiographically. IMPRESSION: 1. Posterior spinal  fusion hardware T8 through T11 with chronic compression fracture at T10. No definite acute osseous abnormality. Multiple chronic compression deformities at T7, L3 and L4. 2. Metallic densities overlying the left lower chest, left paraspinal region and posterior spinal region as above, correlate for external artifact Electronically Signed   By: Luke Bun M.D.   On: 04/12/2024 15:31   CT Head Wo Contrast Result Date: 04/12/2024 EXAM: CT HEAD WITHOUT CONTRAST 04/12/2024 02:32:41 PM TECHNIQUE: CT of the head was performed without the administration of intravenous contrast. Automated exposure control, iterative reconstruction, and/or weight based adjustment of the mA/kV was utilized to reduce the radiation dose to as low as reasonably achievable. COMPARISON: CT head 01/10/2024. CLINICAL HISTORY: Mental status change, unknown cause FINDINGS: BRAIN AND VENTRICLES: No acute hemorrhage. No evidence of acute infarct. No hydrocephalus. No extra-axial collection. No mass effect or midline shift. ORBITS: No acute abnormality. SINUSES: No acute abnormality. SOFT TISSUES AND SKULL: No acute soft tissue abnormality. No skull fracture. IMPRESSION: 1. No acute intracranial abnormality. Electronically signed by: Glendia Molt MD 04/12/2024 03:02 PM EST RP Workstation: [id number]               LOS: 2 days   Katonya Blecher  [facility]   Pager on [email or web address]. If 7PM-7AM, please contact night-coverage at [email or web address]     04/14/2024, 3:26 PM           "

## 2024-04-15 ENCOUNTER — Encounter (INDEPENDENT_AMBULATORY_CARE_PROVIDER_SITE_OTHER): Payer: Self-pay

## 2024-04-15 ENCOUNTER — Inpatient Hospital Stay: Admitting: Dietician

## 2024-04-15 ENCOUNTER — Ambulatory Visit (INDEPENDENT_AMBULATORY_CARE_PROVIDER_SITE_OTHER): Admitting: Gastroenterology

## 2024-04-15 DIAGNOSIS — N3000 Acute cystitis without hematuria: Secondary | ICD-10-CM | POA: Diagnosis not present

## 2024-04-15 MED ORDER — ENSURE PLUS HIGH PROTEIN PO LIQD
237.0000 mL | Freq: Two times a day (BID) | ORAL | Status: DC
  Administered 2024-04-15 – 2024-04-17 (×4): 237 mL via ORAL

## 2024-04-15 MED ORDER — ADULT MULTIVITAMIN W/MINERALS CH
1.0000 | ORAL_TABLET | Freq: Every day | ORAL | Status: DC
  Administered 2024-04-15 – 2024-04-17 (×3): 1 via ORAL
  Filled 2024-04-15 (×3): qty 1

## 2024-04-15 NOTE — Plan of Care (Signed)

## 2024-04-15 NOTE — Evaluation (Signed)
 Physical Therapy Evaluation Patient Details Name: Jamie Ewing MRN: [id number] DOB: 1940/11/29 Today's Date: 04/15/2024  History of Present Illness  Jamie Ewing is a 84 y.o. female with medical history significant of hypertension, multiple myeloma, peripheral neuropathy, aortic valve stenosis who presents to the emergency department due to 5-day onset of back pain and worsening confusion (at baseline, she was not always oriented to time and place).  She was reported to have had 2 falls within the past week with report of suspicion on hitting her head on both times, though falls were unwitnessed.  She started to complain of back pain after the falls.  Most of the history was obtained from EDP and ED medical record.  Per report, patient was reported to have had altered mental status in the past when she had UTI/dehydration.  She denies chest pain, shortness of breath, fever.  Patient lives alone.   Clinical Impression  Patient demonstrates slow labored movement during transfer to/from chair to bed, unsteady on feet using RW, but tolerated ambulating out of room and back to bedside before having to sit due to fatigue and generalized weakness. Patient tolerated sitting up in chair after therapy. Patient will benefit from continued skilled physical therapy in hospital and recommended venue below to increase strength, balance, endurance for safe ADLs and gait.           If plan is discharge home, recommend the following: A little help with walking and/or transfers;A little help with bathing/dressing/bathroom;Help with stairs or ramp for entrance;Assist for transportation;Assistance with cooking/housework   Can travel by private vehicle        Equipment Recommendations None recommended by PT  Recommendations for Other Services       Functional Status Assessment Patient has had a recent decline in their functional status and demonstrates the ability to make significant improvements in function  in a reasonable and predictable amount of time.     Precautions / Restrictions Precautions Precautions: Fall Recall of Precautions/Restrictions: Intact Restrictions Weight Bearing Restrictions Per Provider Order: No      Mobility  Bed Mobility Overal bed mobility: Needs Assistance Bed Mobility: Supine to Sit, Sit to Supine     Supine to sit: Supervision Sit to supine: Supervision   General bed mobility comments: increased time, labored movement    Transfers Overall transfer level: Needs assistance Equipment used: Rolling walker (2 wheels) Transfers: Sit to/from Stand, Bed to chair/wheelchair/BSC Sit to Stand: Contact guard assist   Step pivot transfers: Contact guard assist       General transfer comment: slow labored movement    Ambulation/Gait Ambulation/Gait assistance: Contact guard assist, Min assist Gait Distance (Feet): 35 Feet Assistive device: Rolling walker (2 wheels) Gait Pattern/deviations: Decreased step length - right, Decreased step length - left, Decreased stride length Gait velocity: decreased     General Gait Details: slow labored unsteady movement using RW without loss of balance, required increased time for making turns and limited mostly due to c/o fatigue  Stairs            Wheelchair Mobility     Tilt Bed    Modified Rankin (Stroke Patients Only)       Balance Overall balance assessment: Needs assistance Sitting-balance support: No upper extremity supported, Feet supported Sitting balance-Leahy Scale: Good Sitting balance - Comments: seated at EOB   Standing balance support: Bilateral upper extremity supported, During functional activity, Reliant on assistive device for balance Standing balance-Leahy Scale: Fair Standing balance comment: using RW  Pertinent Vitals/Pain Pain Assessment Pain Assessment: Faces Faces Pain Scale: Hurts a little bit Pain Location: gluteal pain Pain  Descriptors / Indicators: Discomfort Pain Intervention(s): Monitored during session, Repositioned, Limited activity within patient's tolerance    Home Living Family/patient expects to be discharged to:: Private residence Living Arrangements: Alone Available Help at Discharge: Family;Available PRN/intermittently Type of Home: House Home Access: Level entry       Home Layout: One level Home Equipment: Rollator (4 wheels);Shower seat;Toilet riser;Grab bars - tub/shower Additional Comments: Pt lives alone with PRN help from daughter and granddaughter who live in the upstairs unit of the duplex. Pt reports she can have 24/7 assist if she needs it.    Prior Function Prior Level of Function : Needs assist;History of Falls (last six months)  Cognitive Assist : Mobility (cognitive)     Physical Assist : ADLs (physical)   ADLs (physical): IADLs;Bathing Mobility Comments: Ambualtes with rollator. ADLs Comments: Supervision for shower transfers. Independent for ADL's otherwise. IADL's assisted.     Extremity/Trunk Assessment   Upper Extremity Assessment Upper Extremity Assessment: Defer to OT evaluation    Lower Extremity Assessment Lower Extremity Assessment: Generalized weakness    Cervical / Trunk Assessment Cervical / Trunk Assessment: Kyphotic  Communication   Communication Communication: No apparent difficulties    Cognition Arousal: Alert Behavior During Therapy: WFL for tasks assessed/performed                             Following commands: Intact       Cueing Cueing Techniques: Verbal cues     General Comments      Exercises     Assessment/Plan    PT Assessment Patient needs continued PT services  PT Problem List Decreased strength;Decreased activity tolerance;Decreased balance;Decreased mobility       PT Treatment Interventions DME instruction;Gait training;Stair training;Functional mobility training;Therapeutic activities;Therapeutic  exercise;Balance training;Patient/family education    PT Goals (Current goals can be found in the Care Plan section)  Acute Rehab PT Goals Patient Stated Goal: return home with family to assist PT Goal Formulation: With patient Time For Goal Achievement: 04/18/24 Potential to Achieve Goals: Good    Frequency Min 3X/week     Co-evaluation PT/OT/SLP Co-Evaluation/Treatment: Yes Reason for Co-Treatment: To address functional/ADL transfers PT goals addressed during session: Mobility/safety with mobility;Balance;Proper use of DME OT goals addressed during session: ADL's and self-care       AM-PAC PT 6 Clicks Mobility  Outcome Measure Help needed turning from your back to your side while in a flat bed without using bedrails?: None Help needed moving from lying on your back to sitting on the side of a flat bed without using bedrails?: A Little Help needed moving to and from a bed to a chair (including a wheelchair)?: A Little Help needed standing up from a chair using your arms (e.g., wheelchair or bedside chair)?: A Little Help needed to walk in hospital room?: A Little Help needed climbing 3-5 steps with a railing? : A Lot 6 Click Score: 18    End of Session   Activity Tolerance: Patient tolerated treatment well;Patient limited by fatigue Patient left: in chair;with call bell/phone within reach Nurse Communication: Mobility status PT Visit Diagnosis: Unsteadiness on feet (R26.81);Other abnormalities of gait and mobility (R26.89);Muscle weakness (generalized) (M62.81)    Time: 9073-9057 PT Time Calculation (min) (ACUTE ONLY): 16 min   Charges:   PT Evaluation $PT Eval Low Complexity: 1 Low PT  Treatments $Therapeutic Activity: 8-22 mins PT General Charges $$ ACUTE PT VISIT: 1 Visit         2:24 PM, 04/15/24 Lynwood Music, MPT Physical Therapist with [facility] [phone] office [phone] mobile phone

## 2024-04-15 NOTE — Progress Notes (Addendum)
 Initial Nutrition Assessment  DOCUMENTATION CODES:  Not applicable  INTERVENTION:  Ensure Plus High Protein po BID, each supplement provides 350 kcal and 20 grams of protein Magic cup TID with meals, each supplement provides 290 kcal and 9 grams of protein MVI with minerals daily  NUTRITION DIAGNOSIS:  Increased nutrient needs related to catabolic illness (multiple myeloma) as evidenced by estimated needs.  GOAL:  Patient will meet greater than or equal to 90% of their needs  MONITOR:  PO intake, Supplement acceptance  REASON FOR ASSESSMENT:  Consult Assessment of nutrition requirement/status  ASSESSMENT:  84 yo female admitted with UTI, S/P fall at home, FTT. PMH includes HTN, multiple myeloma, peripheral neuropathy, aortic valve stenosis, back pain.  Unable to reach patient by phone this morning. From chart review, she was taking Megace  at home, but it has been causing some nausea, so she has not been taking it recently. Current intake of meals is good. Suspect she is malnourished, given severe weight loss, but unable to obtain enough information at this time for identification of malnutrition.   Usual weight: 65.8 kg 3 months PTA Current weight: 54.9 kg  17% weight loss x 3 months is severe  Average Meal Intake: 1/24-1/25: 50-100% intake x 5 recorded meals  Nutritionally Relevant Medications: Scheduled Meds:  enoxaparin  (LOVENOX ) injection  40 mg Subcutaneous Q24H   feeding supplement  237 mL Oral BID BM   multivitamin with minerals  1 tablet Oral Daily   PARoxetine   10 mg Oral Daily   Continuous Infusions: PRN Meds:.acetaminophen  **OR** acetaminophen , ondansetron  **OR** ondansetron  (ZOFRAN ) IV  Labs Reviewed: BUN 6 (L) HgbA1c 5.4 (2019)  NUTRITION - FOCUSED PHYSICAL EXAM: Unable to complete at this time  Diet Order:   Diet Order             Diet Heart Room service appropriate? Yes; Fluid consistency: Thin  Diet effective now                    EDUCATION NEEDS:  Not appropriate for education at this time  Skin:  Skin Assessment: Reviewed RN Assessment  Last BM:  1/25  Height:  Ht Readings from Last 1 Encounters:  04/12/24 5' 3 (1.6 m)   Weight:  Wt Readings from Last 1 Encounters:  04/12/24 54.9 kg   Ideal Body Weight:  52.3 kg  BMI:  Body mass index is 21.43 kg/m.  Estimated Nutritional Needs:  Kcal:  1600-1800 Protein:  70-85 gm Fluid:  1.5 L   Suzen HUNT RD, LDN, CNSC Contact via secure chat. If unavailable, use group chat RD Inpatient.

## 2024-04-15 NOTE — [place] FL2 (Signed)
 " [place]  MEDICAID FL2 LEVEL OF CARE FORM     IDENTIFICATION  Patient Name: Jamie Ewing Birthdate: 25-Dec-1940 Sex: female Admission Date (Current Location): 04/12/2024  [facility] and [place] Number:  [employer] and Address:  [facility],  [address], [place] [address]      Provider Number: [id number]  Attending Physician Name and Address:  Jens Durand, MD  Relative Name and Phone Number:  Karlena, Luebke  Daughter, Emergency Contact  [phone]    Current Level of Care: Hospital Recommended Level of Care: Skilled Nursing Facility Prior Approval Number:    Date Approved/Denied:   PASRR Number: [id number]  Discharge Plan: SNF    Current Diagnoses: Patient Active Problem List   Diagnosis Date Noted   UTI (urinary tract infection) 04/12/2024   Generalized weakness 11/26/2023   Thrombocytopenia 11/26/2023   Pedal edema 11/13/2023   Pancytopenia (HCC) 11/13/2023   Metastatic multiple myeloma to bone (HCC) 11/12/2023   Peripheral neuropathy 11/12/2023   Moderate aortic stenosis 06/26/2023   Severe left ventricular hypertrophy 06/26/2023   HTN (hypertension) 06/26/2023   Preoperative cardiovascular examination 10/19/2017   Multiple myeloma (HCC) 10/10/2017   Status post surgery 10/06/2017   Thoracic spine tumor 10/06/2017   Spinal stenosis of thoracic region 10/06/2017   Thoracic stenosis 10/06/2017   Lipoma of axilla 05/31/2011    Orientation RESPIRATION BLADDER Height & Weight     Self, Time, Situation, Place  Normal Continent Weight: 121 lb (54.9 kg) Height:  5' 3 (160 cm)  BEHAVIORAL SYMPTOMS/MOOD NEUROLOGICAL BOWEL NUTRITION STATUS      Continent Diet (See DC summary)  AMBULATORY STATUS COMMUNICATION OF NEEDS Skin   Limited Assist Verbally Normal                       Personal Care Assistance Level of Assistance  Bathing, Feeding, Dressing Bathing Assistance: Limited assistance Feeding assistance: Limited  assistance Dressing Assistance: Limited assistance     Functional Limitations Info  Sight, Hearing, Speech Sight Info: Impaired (eyeglasses) Hearing Info: Adequate Speech Info: Adequate    SPECIAL CARE FACTORS FREQUENCY  PT (By licensed PT), OT (By licensed OT)     PT Frequency: 5x/wk OT Frequency: 5x/wk            Contractures Contractures Info: Not present    Additional Factors Info  Code Status, Allergies Code Status Info: FULL Allergies Info: No Known Allergies           Current Medications (04/15/2024):  This is the current hospital active medication list Current Facility-Administered Medications  Medication Dose Route Frequency Provider Last Rate Last Admin   acetaminophen  (TYLENOL ) tablet 650 mg  650 mg Oral Q6H PRN Adefeso, Oladapo, DO       Or   acetaminophen  (TYLENOL ) suppository 650 mg  650 mg Rectal Q6H PRN Adefeso, Oladapo, DO       enoxaparin  (LOVENOX ) injection 40 mg  40 mg Subcutaneous Q24H Adefeso, Oladapo, DO   40 mg at 04/15/24 0844   feeding supplement (ENSURE PLUS HIGH PROTEIN) liquid 237 mL  237 mL Oral BID BM Jens Durand, MD   237 mL at 04/15/24 1301   multivitamin with minerals tablet 1 tablet  1 tablet Oral Daily Jens Durand, MD   1 tablet at 04/15/24 1301   ondansetron  (ZOFRAN ) tablet 4 mg  4 mg Oral Q6H PRN Adefeso, Oladapo, DO       Or   ondansetron  (ZOFRAN ) injection 4 mg  4 mg Intravenous Q6H PRN Adefeso, Oladapo, DO   4 mg at 04/14/24 1546   PARoxetine  (PAXIL ) tablet 10 mg  10 mg Oral Daily Ayiku, Bernard, MD   10 mg at 04/15/24 9155     Discharge Medications: Please see discharge summary for a list of discharge medications.  Relevant Imaging Results:  Relevant Lab Results:   Additional Information SSN: [id number]  Hoy DELENA Bigness, LCSW     "

## 2024-04-15 NOTE — TOC Initial Note (Addendum)
 Transition of Care [facility]) - Initial/Assessment Note    Patient Details  Name: Jamie Ewing MRN: [id number] Date of Birth: [birth year]  [facility] CM/SW Contact:    Hoy DELENA Bigness, LCSW Phone Number: 04/15/2024, 12:06 PM  Clinical Narrative:                 UPDATE 4:15pm: CSW informed that pt's daughter would like pt to go to SNF for rehab prior to returning home. Pt is also agreeable to this plan. CSW discussed potential barrier to SNF placement of insurance approval due to pt's functioning during PT eval. Pt's daughter verbalizes understanding of potential barrier. Daughter prefers placement at [facility] or [place]. Referrals have been faxed out and currently awaiting bed offers.    CSW spoke with pt via t/c to room to complete assessment. Pt is from home alone. Her daughter lives in the apartment upstairs from hers. Pt is ambulatory with a rollator at baseline and is able to independently complete ADL's Pt does not currently received any home services but, is agreeable to have HH arranged. HHPT/OT has been arranged with [facility]. HH orders will need to be placed prior to discharge.    Expected Discharge Plan: Home w Home Health Services Barriers to Discharge: Continued Medical Work up   Patient Goals and CMS Choice Patient states their goals for this hospitalization and ongoing recovery are:: To return home CMS Medicare.gov Compare Post Acute Care list provided to:: Patient Choice offered to / list presented to : Patient      Expected Discharge Plan and Services In-house Referral: Clinical Social Work Discharge Planning Services: CM Consult Post Acute Care Choice: Home Health Living arrangements for the past 2 months: Apartment                 DME Arranged: N/A DME Agency: NA       HH Arranged: PT, OT HH Agency: [facility] Date HH Agency Contacted: 04/15/24 Time HH Agency Contacted: 1205 Representative spoke with at [facility] Agency: HUB  Prior  Living Arrangements/Services Living arrangements for the past 2 months: Apartment Lives with:: Self (Daughter lives in seperate apartment upstairs from pt) Patient language and need for interpreter reviewed:: Yes Do you feel safe going back to the place where you live?: Yes      Need for Family Participation in Patient Care: No (Comment) Care giver support system in place?: No (comment) Current home services: DME (rollator) Criminal Activity/Legal Involvement Pertinent to Current Situation/Hospitalization: No - Comment as needed  Activities of Daily Living   ADL Screening (condition at time of admission) Independently performs ADLs?: Yes (appropriate for developmental age) Is the patient deaf or have difficulty hearing?: No Does the patient have difficulty seeing, even when wearing glasses/contacts?: No Does the patient have difficulty concentrating, remembering, or making decisions?: No  Permission Sought/Granted Permission sought to share information with : Facility Industrial/product Designer granted to share information with : Yes, Verbal Permission Granted     Permission granted to share info w AGENCY: [facility]        Emotional Assessment   Attitude/Demeanor/Rapport: Engaged Affect (typically observed): Accepting, Pleasant Orientation: : Oriented to Self, Oriented to Place, Oriented to  Time, Oriented to Situation Alcohol / Substance Use: Not Applicable Psych Involvement: No (comment)  Admission diagnosis:  Dehydration [E86.0] Confusion [R41.0] UTI (urinary tract infection) [N39.0] Patient Active Problem List   Diagnosis Date Noted   UTI (urinary tract infection) 04/12/2024   Generalized weakness 11/26/2023  Thrombocytopenia 11/26/2023   Pedal edema 11/13/2023   Pancytopenia (HCC) 11/13/2023   Metastatic multiple myeloma to bone (HCC) 11/12/2023   Peripheral neuropathy 11/12/2023   Moderate aortic stenosis 06/26/2023   Severe left ventricular hypertrophy  06/26/2023   HTN (hypertension) 06/26/2023   Preoperative cardiovascular examination 10/19/2017   Multiple myeloma (HCC) 10/10/2017   Status post surgery 10/06/2017   Thoracic spine tumor 10/06/2017   Spinal stenosis of thoracic region 10/06/2017   Thoracic stenosis 10/06/2017   Lipoma of axilla 05/31/2011   PCP:  Sheryle Carwin, MD Pharmacy:   [facility] [phone] - [place], [place] - [address] AT [facility] [address]  [place] [address] Phone: [phone] Fax: [phone]     Social Drivers of Health (SDOH) Social History: SDOH Screenings   Food Insecurity: No Food Insecurity (04/12/2024)  Housing: Low Risk (04/12/2024)  Transportation Needs: No Transportation Needs (04/12/2024)  Utilities: Not At Risk (04/12/2024)  Depression (PHQ2-9): Low Risk (03/26/2024)  Social Connections: Moderately Integrated (04/12/2024)  Tobacco Use: Medium Risk (04/13/2024)   SDOH Interventions:     Readmission Risk Interventions    04/15/2024   12:04 PM  Readmission Risk Prevention Plan  Transportation Screening Complete  PCP or Specialist Appt within 5-7 Days Complete  Home Care Screening Complete  Medication Review (RN CM) Complete

## 2024-04-15 NOTE — Evaluation (Signed)
 Occupational Therapy Evaluation Patient Details Name: Jamie Ewing MRN: [id number] DOB: 12/14/1940 Today's Date: 04/15/2024   History of Present Illness   Jamie Ewing is a 84 y.o. female with medical history significant of hypertension, multiple myeloma, peripheral neuropathy, aortic valve stenosis who presents to the emergency department due to 5-day onset of back pain and worsening confusion (at baseline, she was not always oriented to time and place).  She was reported to have had 2 falls within the past week with report of suspicion on hitting her head on both times, though falls were unwitnessed.  She started to complain of back pain after the falls.  Most of the history was obtained from EDP and ED medical record.  Per report, patient was reported to have had altered mental status in the past when she had UTI/dehydration.  She denies chest pain, shortness of breath, fever.  Patient lives alone. (per DO)     Clinical Impressions Pt agreeable to OT and PT co-evaluation. Pt lives alone but has family living in the same duplex and pt reports they are able to provide the needed assistance. Pt at level of supervision for bed mobility and CGA for functional ambulation with RW. Pt demonstrates ability to complete seated ADL's with set up assist given extended time. Pt demonstrates B UE weakness with mild deficits in end shoulder flexion range of motion. Pt left in the chair with call bell within reach. Pt will benefit from continued OT in the hospital to increase strength, balance, and endurance for safe ADL's.        If plan is discharge home, recommend the following:   A little help with walking and/or transfers;A little help with bathing/dressing/bathroom;Assistance with cooking/housework;Assist for transportation;Help with stairs or ramp for entrance     Functional Status Assessment   Patient has had a recent decline in their functional status and demonstrates the ability to make  significant improvements in function in a reasonable and predictable amount of time.     Equipment Recommendations   None recommended by OT     Recommendations for Other Services         Precautions/Restrictions   Precautions Precautions: Fall Recall of Precautions/Restrictions: Intact Restrictions Weight Bearing Restrictions Per Provider Order: No     Mobility Bed Mobility Overal bed mobility: Needs Assistance Bed Mobility: Supine to Sit, Sit to Supine     Supine to sit: Supervision Sit to supine: Supervision   General bed mobility comments: labored effort; difficulty getting B LE into bed.    Transfers Overall transfer level: Needs assistance Equipment used: Rolling walker (2 wheels) Transfers: Sit to/from Stand, Bed to chair/wheelchair/BSC Sit to Stand: Contact guard assist     Step pivot transfers: Contact guard assist     General transfer comment: Chair to EOB and back with RW.      Balance Overall balance assessment: Needs assistance Sitting-balance support: No upper extremity supported, Feet supported Sitting balance-Leahy Scale: Good Sitting balance - Comments: seated at EOB   Standing balance support: Bilateral upper extremity supported, During functional activity, Reliant on assistive device for balance Standing balance-Leahy Scale: Fair Standing balance comment: using RW                           ADL either performed or assessed with clinical judgement   ADL Overall ADL's : Needs assistance/impaired     Grooming: Set up;Sitting   Upper Body Bathing: Set up;Sitting  Lower Body Bathing: Set up;Sitting/lateral leans   Upper Body Dressing : Set up;Sitting   Lower Body Dressing: Set up;Sitting/lateral leans Lower Body Dressing Details (indicate cue type and reason): Able to doff and don sock with labored effort seated in the chair. Toilet Transfer: Contact guard assist;Rolling walker (2 wheels);Stand-pivot;Ambulation    Toileting- Clothing Manipulation and Hygiene: Contact guard assist;Sitting/lateral lean       Functional mobility during ADLs: Contact guard assist;Rolling walker (2 wheels) General ADL Comments: Short distance ambulator with RW.     Vision Baseline Vision/History: 1 Wears glasses Ability to See in Adequate Light: 0 Adequate Patient Visual Report: No change from baseline Vision Assessment?: Wears glasses for reading;No apparent visual deficits     Perception Perception: Not tested       Praxis Praxis: Not tested       Pertinent Vitals/Pain Pain Assessment Pain Assessment: Faces Faces Pain Scale: Hurts a little bit Pain Location: gluteal pain Pain Descriptors / Indicators: Discomfort Pain Intervention(s): Monitored during session, Repositioned     Extremity/Trunk Assessment Upper Extremity Assessment Upper Extremity Assessment: Generalized weakness (Mild B shoulder A/ROM limitation near end range.)   Lower Extremity Assessment Lower Extremity Assessment: Defer to PT evaluation   Cervical / Trunk Assessment Cervical / Trunk Assessment: Kyphotic   Communication Communication Communication: No apparent difficulties   Cognition Arousal: Alert Behavior During Therapy: WFL for tasks assessed/performed Cognition: No apparent impairments                               Following commands: Intact       Cueing  General Comments   Cueing Techniques: Verbal cues                 Home Living Family/patient expects to be discharged to:: Private residence Living Arrangements: Alone Available Help at Discharge: Family;Available PRN/intermittently Type of Home: House (Duplex) Home Access: Level entry     Home Layout: One level     Bathroom Shower/Tub: Tub/shower unit   Bathroom Toilet: Standard Ambulance Person) Bathroom Accessibility: Yes How Accessible: Accessible via wheelchair;Accessible via walker Home Equipment: Rollator (4 wheels);Shower  seat;Toilet riser;Grab bars - tub/shower   Additional Comments: Pt lives alone with PRN help from daughter and granddaughter who live in the upstairs unit of the duplex. Pt reports she can have 24/7 assist if she needs it.      Prior Functioning/Environment Prior Level of Function : Needs assist;History of Falls (last six months) (4 falls in 6 months)       Physical Assist : ADLs (physical)   ADLs (physical): IADLs;Bathing Mobility Comments: Ambualtes with rollator. ADLs Comments: Supervision for shower transfers. Independent for ADL's otherwise. IADL's assisted.    OT Problem List: Decreased strength;Decreased activity tolerance;Decreased range of motion;Impaired balance (sitting and/or standing)   OT Treatment/Interventions: Self-care/ADL training;Therapeutic exercise;DME and/or AE instruction;Therapeutic activities;Patient/family education;Balance training      OT Goals(Current goals can be found in the care plan section)   Acute Rehab OT Goals Patient Stated Goal: Return home OT Goal Formulation: With patient Time For Goal Achievement: 04/29/24 Potential to Achieve Goals: Good   OT Frequency:  Min 2X/week    Co-evaluation PT/OT/SLP Co-Evaluation/Treatment: Yes Reason for Co-Treatment: To address functional/ADL transfers   OT goals addressed during session: ADL's and self-care                       End of Session Equipment  Utilized During Treatment: Rolling walker (2 wheels)  Activity Tolerance: Patient tolerated treatment well Patient left: in chair;with call bell/phone within reach  OT Visit Diagnosis: Unsteadiness on feet (R26.81);Other abnormalities of gait and mobility (R26.89);Muscle weakness (generalized) (M62.81);History of falling (Z91.81)                Time: 9071-9055 OT Time Calculation (min): 16 min Charges:  OT General Charges $OT Visit: 1 Visit OT Evaluation $OT Eval Low Complexity: 1 Low  Kable Haywood OT, MOT  Jayson Person 04/15/2024, 12:20 PM

## 2024-04-15 NOTE — Progress Notes (Signed)
 Pt ate around 50% of her dinner last night, pt did refuse Megace  at bedtime-pt states Megace  makes her sick and nauseous. Prior shift reported pt vomited her megace  back up after administration.

## 2024-04-15 NOTE — Plan of Care (Signed)
" °  Problem: Acute Rehab PT Goals(only PT should resolve) Goal: Pt Will Go Supine/Side To Sit Outcome: Progressing Flowsheets (Taken 04/15/2024 1425) Pt will go Supine/Side to Sit:  with supervision  with modified independence Goal: Patient Will Transfer Sit To/From Stand Outcome: Progressing Flowsheets (Taken 04/15/2024 1425) Patient will transfer sit to/from stand:  with supervision  with modified independence Goal: Pt Will Transfer Bed To Chair/Chair To Bed Outcome: Progressing Flowsheets (Taken 04/15/2024 1425) Pt will Transfer Bed to Chair/Chair to Bed:  with supervision  with modified independence Goal: Pt Will Ambulate Outcome: Progressing Flowsheets (Taken 04/15/2024 1425) Pt will Ambulate:  50 feet  with supervision  with modified independence  with rolling walker   2:26 PM, 04/15/24 Lynwood Music, MPT Physical Therapist with [facility] [address] [phone] office [phone] mobile phone  "

## 2024-04-15 NOTE — Progress Notes (Addendum)
 Nurse tech went into patient's room and saw her ingest a tablet from a pill bottle with an Ativan  label on it. She told the nurse tech that she has been taking one every night that she has been here. I went to patient's room and she said that she had one aspirin that she had in her ativan  bottle and that is what she took. She said she only took one tablet. Patient also had acyclovir  and potassium tablets at bedside as well. I removed all meds from her room and placed in a bag for [facility]. Notified MD. Florence and notified daughter Suzen as well.

## 2024-04-15 NOTE — Plan of Care (Signed)
" °  Problem: Acute Rehab OT Goals (only OT should resolve) Goal: Pt. Will Perform Grooming Flowsheets (Taken 04/15/2024 1222) Pt Will Perform Grooming: with modified independence Goal: Pt. Will Perform Lower Body Dressing Flowsheets (Taken 04/15/2024 1222) Pt Will Perform Lower Body Dressing: with modified independence Goal: Pt. Will Transfer To Toilet Flowsheets (Taken 04/15/2024 1222) Pt Will Transfer to Toilet:  with modified independence  ambulating Goal: Pt. Will Perform Toileting-Clothing Manipulation Flowsheets (Taken 04/15/2024 1222) Pt Will Perform Toileting - Clothing Manipulation and hygiene: with modified independence Goal: Pt/Caregiver Will Perform Home Exercise Program Flowsheets (Taken 04/15/2024 1222) Pt/caregiver will Perform Home Exercise Program:  Increased strength  Increased ROM  Both right and left upper extremity  Independently  Charly Holcomb OT, MOT  "

## 2024-04-15 NOTE — Progress Notes (Addendum)
 "    Progress Note    Jamie Ewing  FMW:[id number] DOB: 1940-09-22  DOA: 04/12/2024 PCP: Sheryle Carwin, MD      Brief Narrative:    Medical records reviewed and are as summarized below:  Jamie Ewing is a 84 y.o. female  with medical history significant of hypertension, multiple myeloma, peripheral neuropathy, aortic valve stenosis, who presented to the hospital because of back pain and worsening confusion for about 5 days duration.  Reportedly, she has had 2 falls within the past week preceding admission.  Reportedly, she had altered mental status in the past when she had UTI/dehydration.  Vital signs in the ED: Temperature 97.8 F, respiratory rate 18, pulse 115, BP 117/56, oxygen saturation 100% on room air.      Assessment/Plan:   Principal Problem:   UTI (urinary tract infection)    Body mass index is 21.43 kg/m.    Acute UTI: Completed 3 days of IV ceftriaxone  on 04/14/2024.  No growth on blood cultures. Urine culture dis not show any significant growth.    Acute metabolic encephalopathy: Improved.  Patient appears to be at baseline.   General weakness, failure to thrive, s/p falls at home: PT and OT recommended home health therapy.   Dysphagia, suspected esophageal dysphagia: Patient had an outpatient appointment with GI (Dr. Deatrice Dine) today for evaluation of dysphagia and ?possible esophageal dilatation.  Daughter requested speech therapy evaluation and plans to reschedule appointment.   Comorbidities include moderate aortic valve stenosis, multiple myeloma, peripheral neuropathy, hypertension   Plan discussed with Ms. Kimberly Finder.  She was informed that PT and OT recommended home health therapy.  However, she thinks patient should be at [facility] and requested placement.  Consulted child psychotherapist to assist with placement.   Diet Order             Diet Heart Room service appropriate? Yes; Fluid consistency: Thin  Diet effective now                                   Consultants: None  Procedures: None    Medications:    enoxaparin  (LOVENOX ) injection  40 mg Subcutaneous Q24H   feeding supplement  237 mL Oral BID BM   multivitamin with minerals  1 tablet Oral Daily   PARoxetine   10 mg Oral Daily   Continuous Infusions:     Anti-infectives (From admission, onward)    Start     Dose/Rate Route Frequency Ordered Stop   04/13/24 1800  cefTRIAXone  (ROCEPHIN ) 1 g in sodium chloride  0.9 % 100 mL IVPB  Status:  Discontinued        1 g 200 mL/hr over 30 Minutes Intravenous Every 24 hours 04/13/24 0629 04/15/24 1056   04/12/24 2015  cefTRIAXone  (ROCEPHIN ) 1 g in sodium chloride  0.9 % 100 mL IVPB        1 g 200 mL/hr over 30 Minutes Intravenous  Once 04/12/24 2009 04/12/24 2133              Family Communication/Anticipated D/C date and plan/Code Status   DVT prophylaxis: enoxaparin  (LOVENOX ) injection 40 mg Start: 04/13/24 1000 SCDs Start: 04/13/24 9471     Code Status: Full Code  Family Communication: Plan discussed with Marval, friend and neighbor, at the bedside with patient's permission Disposition Plan: Plan to discharge home versus SNF   Status is: Inpatient Remains inpatient appropriate because: Acute UTI  Subjective:   No acute events noted.  She feels better today.  No complaints.  Objective:    Vitals:   04/14/24 1532 04/14/24 2007 04/15/24 0551 04/15/24 1300  BP: 137/71 (!) 146/71 133/64 (!) 106/52  Pulse: (!) 109 (!) 108 (!) 103 (!) 104  Resp: 18 18 16 16   Temp: 98.2 F (36.8 C) 98 F (36.7 C) 98.4 F (36.9 C) 98.5 F (36.9 C)  TempSrc: Oral Oral Oral Oral  SpO2: 99% 100% 100% 98%  Weight:      Height:       No data found.   Intake/Output Summary (Last 24 hours) at 04/15/2024 1613 Last data filed at 04/15/2024 0334 Gross per 24 hour  Intake 320 ml  Output --  Net 320 ml   Filed Weights   04/12/24 2300  Weight: 54.9 kg    Exam:  GEN: NAD,  sitting up in the chair SKIN: Warm and dry EYES: No pallor or icterus ENT: MMM CV: RRR PULM: CTA B ABD: soft, ND, NT, +BS CNS: AAO x 3, non focal EXT: No edema or tenderness        Data Reviewed:   I have personally reviewed following labs and imaging studies:  Labs: Labs show the following:   Basic Metabolic Panel: Recent Labs  Lab 04/12/24 1503 04/13/24 0618 04/14/24 0434  NA 136 138  --   K 4.3 3.5  --   CL 98 106  --   CO2 19* 20*  --   GLUCOSE 106* 89  --   BUN 10 6*  --   CREATININE 0.67 0.55  --   CALCIUM 10.0 8.9  --   MG 2.0 1.8  --   PHOS  --   --  3.0   GFR Estimated Creatinine Clearance: 44.8 mL/min (by C-G formula based on SCr of 0.55 mg/dL). Liver Function Tests: Recent Labs  Lab 04/12/24 1503 04/13/24 0618  AST 26 38  ALT 12 16  ALKPHOS 57 68  BILITOT 0.6 0.5  PROT 6.8 5.7*  ALBUMIN  3.7 3.2*   No results for input(s): LIPASE, AMYLASE in the last 168 hours. No results for input(s): AMMONIA in the last 168 hours. Coagulation profile No results for input(s): INR, PROTIME in the last 168 hours.  CBC: Recent Labs  Lab 04/12/24 1503 04/13/24 0618  WBC 7.5 6.3  NEUTROABS 5.5  --   HGB 11.3* 9.8*  HCT 33.5* 29.3*  MCV 97.1 98.0  PLT 246 185   Cardiac Enzymes: Recent Labs  Lab 04/12/24 1504  CKTOTAL 75   BNP (last 3 results) No results for input(s): PROBNP in the last 8760 hours. CBG: No results for input(s): GLUCAP in the last 168 hours. D-Dimer: No results for input(s): DDIMER in the last 72 hours. Hgb A1c: No results for input(s): HGBA1C in the last 72 hours. Lipid Profile: No results for input(s): CHOL, HDL, LDLCALC, TRIG, CHOLHDL, LDLDIRECT in the last 72 hours. Thyroid  function studies: No results for input(s): TSH, T4TOTAL, T3FREE, THYROIDAB in the last 72 hours.  Invalid input(s): FREET3 Anemia work up: No results for input(s): VITAMINB12, FOLATE, FERRITIN, TIBC,  IRON, RETICCTPCT in the last 72 hours. Sepsis Labs: Recent Labs  Lab 04/12/24 1503 04/12/24 2035 04/12/24 2321 04/13/24 0618  WBC 7.5  --   --  6.3  LATICACIDVEN  --  1.9 1.7  --     Microbiology Recent Results (from the past 240 hours)  Urine Culture     Status: Abnormal  Collection Time: 04/12/24  5:11 PM   Specimen: Urine, Clean Catch  Result Value Ref Range Status   Specimen Description   Final    URINE, CLEAN CATCH Performed at [facility], [address], [place], [place] [address]    Special Requests   Final    NONE Performed at [facility], [address] [place], [place] [address]    Culture (A)  Final    <10,000 COLONIES/mL INSIGNIFICANT GROWTH Performed at [facility], [address], [place], [place] [address]    Report Status 04/13/2024 FINAL  Final  Culture, blood (routine x 2)     Status: None (Preliminary result)   Collection Time: 04/12/24  8:35 PM   Specimen: Right Antecubital; Blood  Result Value Ref Range Status   Specimen Description RIGHT ANTECUBITAL  Final   Special Requests   Final    BOTTLES DRAWN AEROBIC AND ANAEROBIC Blood Culture adequate volume   Culture   Final    NO GROWTH 3 DAYS Performed at [facility], [address], [place], [place] [address]    Report Status PENDING  Incomplete  Culture, blood (routine x 2)     Status: None (Preliminary result)   Collection Time: 04/12/24  8:39 PM   Specimen: Left Antecubital; Blood  Result Value Ref Range Status   Specimen Description LEFT ANTECUBITAL  Final   Special Requests   Final    BOTTLES DRAWN AEROBIC AND ANAEROBIC Blood Culture adequate volume   Culture   Final    NO GROWTH 3 DAYS Performed at [facility], [address] [place], [place] [address]    Report Status PENDING  Incomplete    Procedures and diagnostic studies:  CT Cervical Spine Wo Contrast Result Date: 04/12/2024 EXAM: CT CERVICAL SPINE WITHOUT CONTRAST 04/12/2024 03:27:23 PM TECHNIQUE: CT of the  cervical spine was performed without the administration of intravenous contrast. Multiplanar reformatted images are provided for review. Automated exposure control, iterative reconstruction, and/or weight based adjustment of the mA/kV was utilized to reduce the radiation dose to as low as reasonably achievable. COMPARISON: MRI 09/07/2022 and radiographs 12/25/2016. CLINICAL HISTORY: Fall, neck trauma, altered mental status, multiple myeloma. FINDINGS: BONES AND ALIGNMENT: Spurring at the anterior C1-C2 articulation. Mild reversal of the normal cervical lordosis centered at C5. Scattered lucent lesions in the cervical spine most confluent at C5 but also present at all other levels, favoring multiple myeloma. 2 mm degenerative anterolisthesis at T1-T2, unchanged from 09/07/2022. No cervical spine fracture or acute subluxation identified. DEGENERATIVE CHANGES: Cervical spondylosis contributes to right foraminal impingement at C2-C3, C3-C4, C4-C5, and C5-C6; and left foraminal impingement at C3-C4, C4-C5, C5-C6, and C6-C7. SOFT TISSUES: Bilateral carotid atheromatous vascular calcifications. ** check report for structure not belonging to this anatomic region **. No prevertebral soft tissue swelling. IMPRESSION: 1. No acute cervical spine fracture or subluxation. 2. Scattered lucent lesions throughout the cervical spine, most confluent at C5, favoring multiple myeloma. 3. Cervical spondylosis with right foraminal impingement at C2-3, C3-4, C4-5, and C5-6, and left foraminal impingement at C3-4, C4-5, C5-6, and C6-7. 4. Degenerative changes including anterior spurring at the C1-2 articulation and mild reversal of the normal cervical lordosis centered at C5. 5. Chronic 2 mm degenerative anterolisthesis at T1-2, unchanged. 6. Bilateral carotid atheromatous calcifications. Electronically signed by: Ryan Salvage MD 04/12/2024 03:36 PM EST RP Workstation: [id number]   DG Thoracic Spine 2 View Result Date:  04/12/2024 CLINICAL DATA:  Fall EXAM: THORACIC SPINE 2 VIEWS COMPARISON:  CT  11/26/2023, 10/26/2022 FINDINGS: Posterior spinal fusion hardware T8 through T11, stable in appearance. Chronic compression fracture T10. Stable mild compression deformity at T7. Incompletely visualized compression deformity at L3 on L4. Stable alignment and no definite new osseous abnormality. Metallic density overlying the left lower chest and left lower paraspinal region on frontal view. Metallic density overlying the spinous process of T12 on the lateral view. Known myeloma lytic lesions on CT are not well seen radiographically. IMPRESSION: 1. Posterior spinal fusion hardware T8 through T11 with chronic compression fracture at T10. No definite acute osseous abnormality. Multiple chronic compression deformities at T7, L3 and L4. 2. Metallic densities overlying the left lower chest, left paraspinal region and posterior spinal region as above, correlate for external artifact Electronically Signed   By: Luke Bun M.D.   On: 04/12/2024 15:31   CT Head Wo Contrast Result Date: 04/12/2024 EXAM: CT HEAD WITHOUT CONTRAST 04/12/2024 02:32:41 PM TECHNIQUE: CT of the head was performed without the administration of intravenous contrast. Automated exposure control, iterative reconstruction, and/or weight based adjustment of the mA/kV was utilized to reduce the radiation dose to as low as reasonably achievable. COMPARISON: CT head 01/10/2024. CLINICAL HISTORY: Mental status change, unknown cause FINDINGS: BRAIN AND VENTRICLES: No acute hemorrhage. No evidence of acute infarct. No hydrocephalus. No extra-axial collection. No mass effect or midline shift. ORBITS: No acute abnormality. SINUSES: No acute abnormality. SOFT TISSUES AND SKULL: No acute soft tissue abnormality. No skull fracture. IMPRESSION: 1. No acute intracranial abnormality. Electronically signed by: Glendia Molt MD 04/12/2024 03:02 PM EST RP Workstation: [id number]                LOS: 3 days   Ednamae Schiano  [facility]   Pager on [email or web address]. If 7PM-7AM, please contact night-coverage at [email or web address]     04/15/2024, 4:13 PM           "

## 2024-04-15 NOTE — Progress Notes (Signed)
 Planning to follow-up with patient via telephone. Chart reviewed. Patient is currently hospitalized. Nutrition appointment rescheduled for 2/9 via telephone.

## 2024-04-16 ENCOUNTER — Inpatient Hospital Stay (HOSPITAL_COMMUNITY)

## 2024-04-16 DIAGNOSIS — R5381 Other malaise: Secondary | ICD-10-CM | POA: Insufficient documentation

## 2024-04-16 DIAGNOSIS — R1310 Dysphagia, unspecified: Secondary | ICD-10-CM

## 2024-04-16 MED ORDER — MELATONIN 3 MG PO TABS
6.0000 mg | ORAL_TABLET | Freq: Once | ORAL | Status: AC
  Administered 2024-04-16: 6 mg via ORAL
  Filled 2024-04-16: qty 2

## 2024-04-16 NOTE — Plan of Care (Signed)
   Problem: Education: Goal: Knowledge of General Education information will improve Description Including pain rating scale, medication(s)/side effects and non-pharmacologic comfort measures Outcome: Progressing

## 2024-04-16 NOTE — Progress Notes (Signed)
 " Progress Note    Jamie Ewing   FMW:[id number]  DOB: 01/12/1941  DOA: 04/12/2024     4 PCP: Sheryle Carwin, MD  Initial CC: Confusion  Hospital Course: Jamie Ewing is a 84 y.o. female with medical history significant of hypertension, multiple myeloma, peripheral neuropathy, aortic valve stenosis, who presented to the hospital because of back pain and worsening confusion for about 5 days duration.    Reportedly, she has had 2 falls within the past week preceding admission.  Reportedly, she had altered mental status in the past when she had UTI/dehydration.   Vital signs in the ED: Temperature 97.8 F, respiratory rate 18, pulse 115, BP 117/56, oxygen saturation 100% on room air.     Assessment/Plan:    Acute UTI: Completed 3 days of IV ceftriaxone  on 04/14/2024.  No growth on blood cultures. Urine culture dis not show any significant growth.    Acute metabolic encephalopathy: Resolved.   Patient appears to be at baseline.   General weakness, failure to thrive, s/p falls at home:  PT and OT recommended home health therapy; family wishes for SNF -Awaiting placement     Dysphagia, suspected esophageal dysphagia - Patient had an outpatient appointment with GI (Dr. Deatrice Dine) during this hospitalization for evaluation of dysphagia and ?possible esophageal dilatation.  Daughter requested speech therapy evaluation and plans to reschedule appointment. -Evaluated by SLP and currently on dysphagia 3 diet   Comorbidities include moderate aortic valve stenosis, multiple myeloma, peripheral neuropathy, hypertension  Interval History:  No events overnight.  Sitting up in recliner comfortable this morning. Family wishing for SNF at discharge. Medically stable and awaiting placement.   Antimicrobials: Rocephin  04/12/2024 >> 04/14/2024  Consultants:    Procedures:    DVT prophylaxis:  enoxaparin  (LOVENOX ) injection 40 mg Start: 04/13/24 1000 SCDs Start: 04/13/24 0528   Code  Status:   Code Status: Full Code  Barriers to discharge: None Therapy evaluation: PT Orders: Active   PT Follow up Rec: Home Health Pt1/27/2026 1115  Disposition Plan: SNF Status is: Inpatient  Mobility Assessment (Last 72 Hours)     Mobility Assessment     Row Name 04/16/24 1115 04/16/24 0900 04/15/24 2300 04/15/24 1418 04/15/24 1100   Does the patient have exclusion criteria? -- No- Perform mobility assessment No- Perform mobility assessment -- --   What is the highest level of mobility based on the mobility assessment? Level 4 (Ambulates with assistance) - Balance while stepping forward/back - Complete Level 4 (Ambulates with assistance) - Balance while stepping forward/back - Complete Level 3 (Stands with assistance) - Balance while standing  and cannot march in place Level 4 (Ambulates with assistance) - Balance while stepping forward/back - Complete Level 4 (Ambulates with assistance) - Balance while stepping forward/back - Complete   Is the above level different from baseline mobility prior to current illness? -- Yes - Recommend PT order Yes - Recommend PT order -- --    Row Name 04/15/24 1011 04/14/24 1958 04/14/24 0845 04/13/24 2000     Does the patient have exclusion criteria? No- Perform mobility assessment No- Perform mobility assessment No- Perform mobility assessment No- Perform mobility assessment    What is the highest level of mobility based on the mobility assessment? Level 3 (Stands with assistance) - Balance while standing  and cannot march in place Level 3 (Stands with assistance) - Balance while standing  and cannot march in place Level 3 (Stands with assistance) - Balance while standing  and  cannot march in place Level 3 (Stands with assistance) - Balance while standing  and cannot march in place    Is the above level different from baseline mobility prior to current illness? -- Yes - Recommend PT order -- Yes - Recommend PT order       Diet: Diet Orders (From  admission, onward)     Start     Ordered   04/16/24 1243  DIET DYS 3 Room service appropriate? Yes; Fluid consistency: Thin  Diet effective now       Question Answer Comment  Room service appropriate? Yes   Fluid consistency: Thin      04/16/24 1243            Objective: Blood pressure (!) 124/97, pulse (!) 101, temperature 98.2 F (36.8 C), temperature source Oral, resp. rate 16, height 5' 3 (1.6 m), weight 54.9 kg, SpO2 97%.  Examination:  Physical Exam Constitutional:      Appearance: Normal appearance.  HENT:     Head: Normocephalic and atraumatic.     Mouth/Throat:     Mouth: Mucous membranes are moist.  Eyes:     Extraocular Movements: Extraocular movements intact.  Cardiovascular:     Rate and Rhythm: Normal rate and regular rhythm.  Pulmonary:     Effort: Pulmonary effort is normal. No respiratory distress.     Breath sounds: Normal breath sounds. No wheezing.  Abdominal:     General: Bowel sounds are normal. There is no distension.     Palpations: Abdomen is soft.     Tenderness: There is no abdominal tenderness.  Musculoskeletal:        General: Normal range of motion.     Cervical back: Normal range of motion and neck supple.  Skin:    General: Skin is warm and dry.  Neurological:     General: No focal deficit present.     Mental Status: She is alert.  Psychiatric:        Mood and Affect: Mood normal.      Data Reviewed: No results found for this or any previous visit (from the past 24 hours).  I have reviewed pertinent nursing notes, vitals, labs, and images as necessary. I have ordered labwork to follow up on as indicated.  I have reviewed the last notes from staff over past 24 hours. I have discussed patient's care plan and test results with nursing staff, CM/SW, and other staff as appropriate.  Old records reviewed in assessment of this patient  Time spent: Greater than 50% of the 55 minute visit was spent in counseling/coordination of care  for the patient as laid out in the A&P.   LOS: 4 days   Alm Apo, MD [facility] 04/16/2024, 3:53 PM "

## 2024-04-16 NOTE — Hospital Course (Signed)
 Jamie Ewing is a 84 y.o. female with medical history significant of hypertension, multiple myeloma, peripheral neuropathy, aortic valve stenosis, who presented to the hospital because of back pain and worsening confusion for about 5 days duration.    Reportedly, she has had 2 falls within the past week preceding admission.  Reportedly, she had altered mental status in the past when she had UTI/dehydration.   Vital signs in the ED: Temperature 97.8 F, respiratory rate 18, pulse 115, BP 117/56, oxygen saturation 100% on room air.     Assessment/Plan:    Acute UTI: Completed 3 days of IV ceftriaxone  on 04/14/2024.  No growth on blood cultures. Urine culture dis not show any significant growth.    Acute metabolic encephalopathy: Resolved.   Patient appears to be at baseline.   General weakness, failure to thrive, s/p falls at home:  PT and OT recommended home health therapy; family wishes for SNF -Awaiting placement     Dysphagia, suspected esophageal dysphagia - Patient had an outpatient appointment with GI (Dr. Deatrice Dine) during this hospitalization for evaluation of dysphagia and ?possible esophageal dilatation.  Daughter requested speech therapy evaluation and plans to reschedule appointment. -Evaluated by SLP and currently on dysphagia 3 diet   Comorbidities include moderate aortic valve stenosis, multiple myeloma, peripheral neuropathy, hypertension

## 2024-04-16 NOTE — TOC Progression Note (Signed)
 Transition of Care [facility]) - Progression Note    Patient Details  Name: Jamie Ewing MRN: [id number] Date of Birth: [birth year]  [facility]) CM/SW Contact  Noreen KATHEE [place], [place] Phone Number: 04/16/2024, 10:26 AM  Clinical Narrative:     CSW spoke with patient daughter and made her aware of bed offer from [facility] . Daughter accepted bed offer and asked if the nurse and doctor could be aware of patient mouth and throat. Daughter shared that patient has not been eating due to the pain in swallowing  . She also reports that it is hard to understand what patient is saying,  almost like her  Esophagus is closing. Daughter stated that patient needs pureed food or soup to help. Nurse and MD was made aware. ICM will start auth for SNF.   Expected Discharge Plan: Skilled Nursing Facility Barriers to Discharge: English As A Second Language Teacher, Continued Medical Work up               Expected Discharge Plan and Services In-house Referral: Clinical Social Work Discharge Planning Services: [employer] Consult Post Acute Care Choice: Home Health Living arrangements for the past 2 months: Apartment                 DME Arranged: N/A DME Agency: NA       HH Arranged: PT, OT HH Agency: [facility] Date HH Agency Contacted: 04/15/24 Time HH Agency Contacted: 1205 Representative spoke with at [facility] Agency: HUB   Social Drivers of Health (SDOH) Interventions SDOH Screenings   Food Insecurity: No Food Insecurity (04/12/2024)  Housing: Low Risk (04/12/2024)  Transportation Needs: No Transportation Needs (04/12/2024)  Utilities: Not At Risk (04/12/2024)  Depression (PHQ2-9): Low Risk (03/26/2024)  Social Connections: Moderately Integrated (04/12/2024)  Tobacco Use: Medium Risk (04/13/2024)    Readmission Risk Interventions    04/16/2024   10:26 AM 04/15/2024   12:04 PM  Readmission Risk Prevention Plan  Transportation Screening Complete Complete  PCP or Specialist Appt within  5-7 Days  Complete  Home Care Screening Complete Complete  Medication Review (RN CM) Complete Complete

## 2024-04-16 NOTE — Evaluation (Signed)
 Clinical/Bedside Swallow Evaluation Patient Details  Name: Jamie Ewing MRN: [id number] Date of Birth: April 24, 1940  Today's Date: 04/16/2024 Time: SLP Start Time (ACUTE ONLY): 1220 SLP Stop Time (ACUTE ONLY): 1245 SLP Time Calculation (min) (ACUTE ONLY): 25 min  Past Medical History:  Past Medical History:  Diagnosis Date   Back pain    Cancer (HCC)    mult myeloma 2019   History of radiation therapy    RIght orbit and left chest- 10/19/23-11/06/23- Dr. Lynwood Nasuti   Hypercholesteremia    Hypertension    Recurrent multiple myeloma of bone marrow with unknown EBV status (HCC)    Past Surgical History:  Past Surgical History:  Procedure Laterality Date   BONE MARROW BIOPSY     LAMINECTOMY N/A 10/06/2017   Procedure: THORACIC NINE AND TEN LAMINECTOMY WITH RESECTION OF TUMOR, THORACIC EIGHT TO THORACIC ELEVEN FUSION WITH PEDICLE SCREW FIXATION;  Surgeon: Louis Shove, MD;  Location: [facility] OR;  Service: Neurosurgery;  Laterality: N/A;   REPLACEMENT TOTAL KNEE Left 2003   TOTAL KNEE ARTHROPLASTY  2001   HPI:  Jamie Ewing is a 84 y.o. female with medical history significant of hypertension, multiple myeloma, peripheral neuropathy, aortic valve stenosis who presented to the emergency department due to 5-day onset of back pain and worsening confusion (at baseline, she was not always oriented to time and place).  She was reported to have had 2 falls within the past week with report of suspicion on hitting her head on both times, though falls were unwitnessed.  She started to complain of back pain after the falls.  Most of the history was obtained from EDP and ED medical record.  Per report, patient was reported to have had altered mental status in the past when she had UTI/dehydration.  She denies chest pain, shortness of breath, fever.  Patient lives alone. ST consulted for clinical swallow evaluation as pt was placed on a Dysphagia 1(puree)/thin liquid diet per family request.  Pt noted  significant weight loss over last few months per report.    Assessment / Plan / Recommendation  Clinical Impression  Recommend MBS to objectively assess swallow function and r/o oropharyngeal dysphagia.  Progress diet to Dysphagia 3(chopped)/thin liquids with small bites/sips and liquid wash/multiple swallows prn for pharyngeal clearance.  Medications provided with liquids.  ST will f/u briefly for diet tolerance/education during acute stay.  Jamie Ewing seen for a clinical swallow evaluation with multiple swallows and liquid wash required during intake of solids/thin liquids per pt report of globus sensation and inability to consume certain textures/consistencies of food reporting being unable to swallow.  No overt s/s of aspiration seen during trial of po intake, but pt reluctant to consume dry solids during evaluation.  Pt noted odynophagia recently d/t candidiasis.  OME revealed slight white material on lingual surface during lingual protrusion, but pt able to swallow without pain per observation.  Pt also informed SLP of recent significant weight loss(20+ lbs) d/t consuming limited consistencies (primarily liquids) d/t dysphagia.  Discussed potential for MBS to objectively assess swallow function and pt in agreement.   SLP Visit Diagnosis: Dysphagia, unspecified (R13.10)    Aspiration Risk  Mild aspiration risk    Diet Recommendation   Thin;Dysphagia 3 (mechanical soft)  Medication Administration: Whole meds with liquid    Other Recommendations Oral Care Recommendations: Oral care BID;Patient independent with oral care     Swallow Evaluation Recommendations  PO diet (Dysphagia 3(chopped)/thin liquids   Assistance Recommended at Discharge  TBD  Functional Status Assessment Patient has had a recent decline in their functional status and demonstrates the ability to make significant improvements in function in a reasonable and predictable amount of time.  Frequency and Duration Other  (Comment) (TBD)          Prognosis Prognosis for improved oropharyngeal function: Good      Swallow Study   General Date of Onset: 04/12/24 HPI: Jamie Ewing is a 84 y.o. female with medical history significant of hypertension, multiple myeloma, peripheral neuropathy, aortic valve stenosis who presents to the emergency department due to 5-day onset of back pain and worsening confusion (at baseline, she was not always oriented to time and place).  She was reported to have had 2 falls within the past week with report of suspicion on hitting her head on both times, though falls were unwitnessed.  She started to complain of back pain after the falls.  Most of the history was obtained from EDP and ED medical record.  Per report, patient was reported to have had altered mental status in the past when she had UTI/dehydration.  She denies chest pain, shortness of breath, fever.  Patient lives alone. ST consulted for clinical swallow evaluation. Type of Study: Bedside Swallow Evaluation Previous Swallow Assessment: n/a Diet Prior to this Study: Dysphagia 1 (pureed);Thin liquids (Level 0) Temperature Spikes Noted: No Respiratory Status: Room air History of Recent Intubation: No Behavior/Cognition: Alert;Cooperative Oral Cavity Assessment: Other (comment) (white material noted on lingual surface) Oral Care Completed by SLP: No Oral Cavity - Dentition: Dentures, top;Dentures, bottom Vision: Functional for self-feeding Self-Feeding Abilities: Able to feed self Patient Positioning: Upright in chair Baseline Vocal Quality: Hoarse (min d/t xerostomia/?candidiasis) Volitional Cough: Strong Volitional Swallow: Able to elicit    Oral/Motor/Sensory Function Overall Oral Motor/Sensory Function: Within functional limits   Ice Chips Ice chips: Within functional limits Presentation: Spoon   Thin Liquid Thin Liquid: Within functional limits Presentation: Self Fed;Straw;Cup    Nectar Thick Nectar Thick  Liquid: Not tested   Honey Thick Honey Thick Liquid: Not tested   Puree Puree: Impaired Presentation: Self Fed Pharyngeal Phase Impairments: Multiple swallows   Solid     Solid: Impaired Presentation: Self Fed Oral Phase Functional Implications: Impaired mastication Pharyngeal Phase Impairments: Multiple swallows;Other (comments) (globus sensation)      Pat Dequavious Harshberger,M.S.,CCC-SLP 04/16/2024,1:57 PM

## 2024-04-16 NOTE — Consult Note (Signed)
 Modified Barium Swallow Study  Patient Details  Name: Jamie Ewing MRN: [id number] Date of Birth: 1940/05/20  Today's Date: 04/16/2024  Modified Barium Swallow completed.  Full report located under Chart Review in the Imaging Section.  History of Present Illness Jamie Ewing is a 84 y.o. female with medical history significant of hypertension, multiple myeloma, peripheral neuropathy, aortic valve stenosis who presented to the emergency department due to 5-day onset of back pain and worsening confusion (at baseline, she was not always oriented to time and place). She was reported to have had 2 falls within the past week with report of suspicion on hitting her head on both times, though falls were unwitnessed. She started to complain of back pain after the falls. Most of the history was obtained from EDP and ED medical record. Per report, patient was reported to have had altered mental status in the past when she had UTI/dehydration. Patient lives alone. ST consulted for clinical swallow evaluation with recommendations for Dysphagia 3(chopped)/thin liquid diet.  MBS ro r/o aspiration/reported dysphagia symptoms by pt including difficulty initiating swallow and globus sensation with certain textures/consistencies.   Clinical Impression Pt demonstrated a normal oropharyngeal swallow c/b brisk tongue motion, timely and efficient swallow for age (triggered at the vallecular space occasionally) and complete epiglottic inversion, hyolaryngeal elevation and complete airway closure for all consistencies. No aspiration/penetration noted with any consistency.  Potential mild esophageal dysphagia d/t partial distention/obstruction of flow into esophagus resulting in pyriform residue requiring liquid wash and/or multiple swallows with puree/thin via large volumes.  Slight distal esophageal retention observed during study.  May consider f/u esophageal assessment to fully assess function.  Gave pt esophageal  precautions to follow during meals with pt appreciative.  ST will f/u briefly for dysphagia education/diet tolerance. Factors that may increase risk of adverse event in presence of aspiration Jamie Ewing & Jamie Ewing 2021): Poor general health and/or compromised immunity;Respiratory or GI disease;Reduced cognitive function;Frail or deconditioned;Weak cough;Aspiration of thick, dense, and/or acidic materials;Frequent aspiration of large volumes  Swallow Evaluation Recommendations Recommendations: PO diet PO Diet Recommendation: Dysphagia 3 (Mechanical soft);Thin liquids (Level 0) Liquid Administration via: Cup;Straw Medication Administration: Whole meds with liquid Supervision: Patient able to self-feed Swallowing strategies  : Slow rate;Small bites/sips;effortful swallow;Multiple dry swallows after each bite/sip;Follow solids with liquids Postural changes: Position pt fully upright for meals;Stay upright 30-60 min after meals Oral care recommendations: Oral care BID (2x/day);Pt independent with oral care Recommended consults: Consider GI consultation;Consider esophageal assessment      Jamie Ewing Jamie Ewing,M.S.,CCC-SLP 04/16/2024,3:36 PM

## 2024-04-16 NOTE — Progress Notes (Signed)
 Physical Therapy Treatment Patient Details Name: Jamie Ewing MRN: [id number] DOB: [birth year] Today's Date: 04/16/2024   History of Present Illness Jamie Ewing is a 84 y.o. female with medical history significant of hypertension, multiple myeloma, peripheral neuropathy, aortic valve stenosis who presents to the emergency department due to 5-day onset of back pain and worsening confusion (at baseline, she was not always oriented to time and place).  She was reported to have had 2 falls within the past week with report of suspicion on hitting her head on both times, though falls were unwitnessed.  She started to complain of back pain after the falls.  Most of the history was obtained from EDP and ED medical record.  Per report, patient was reported to have had altered mental status in the past when she had UTI/dehydration.  She denies chest pain, shortness of breath, fever.  Patient lives alone.    PT Comments  PT appears stable but lost her balance momentarily when coming stand to sit in the restroom, pt states that this was due mainly due to commode being so low.      If plan is discharge home, recommend the following: A little help with walking and/or transfers;A little help with bathing/dressing/bathroom;Help with stairs or ramp for entrance;Assist for transportation;Assistance with cooking/housework   Can travel by private vehicle      yes  Equipment Recommendations  None recommended by PT    Recommendations for Other Services  none     Precautions / Restrictions Precautions Precautions: Fall Recall of Precautions/Restrictions: Intact Restrictions Weight Bearing Restrictions Per Provider Order: No     Mobility  Bed Mobility Overal bed mobility: Modified Independent Bed Mobility: Supine to Sit, Sit to Supine     Supine to sit: Modified independent (Device/Increase time)          Transfers   Equipment used: Rolling walker (2 wheels) Transfers: Sit to/from Stand, Bed  to chair/wheelchair/BSC Sit to Stand: Modified independent (Device/Increase time)   Step pivot transfers: Modified independent (Device/Increase time)            Ambulation/Gait Ambulation/Gait assistance: Contact guard assist Gait Distance (Feet): 12 Feet (x 2) Assistive device: Rolling walker (2 wheels) Gait Pattern/deviations: Decreased step length - right, Decreased step length - left, Decreased stride length                    Cognition  Wnl                                       Cueing  Follows verbal cues          Pertinent Vitals/Pain  None noted during treatment     Home Living Family/patient expects to be discharged to:: Private residence Living Arrangements: Alone Available Help at Discharge: Family;Available PRN/intermittently Type of Home: House Home Access: Level entry       Home Layout: One level Home Equipment: Rollator (4 wheels);Shower seat;Toilet riser;Grab bars - tub/shower Additional Comments: Pt lives alone with PRN help from daughter and granddaughter who live in the upstairs unit of the duplex. Pt reports she can have 24/7 assist if she needs it.    Prior Function            PT Goals (current goals can now be found in the care plan section) Acute Rehab PT Goals Patient Stated Goal: return home with family to assist  PT Goal Formulation: With patient Progress towards PT goals: Progressing toward goals    Frequency    Min 3X/week      PT Plan  HH          End of Session Equipment Utilized During Treatment: Gait belt Activity Tolerance: Patient limited by fatigue Patient left: in chair;with call bell/phone within reach;with chair alarm set Nurse Communication: Mobility status PT Visit Diagnosis: Unsteadiness on feet (R26.81);Other abnormalities of gait and mobility (R26.89);Muscle weakness (generalized) (M62.81)     Time: 8964-8941 PT Time Calculation (min) (ACUTE ONLY): 23 min  Charges:     $Therapeutic Activity: 23-37 mins PT General Charges $$ ACUTE PT VISIT: 1 Visit                      Montie Metro, PT CLT [phone]  04/16/2024, 11:18 AM

## 2024-04-16 NOTE — Plan of Care (Signed)
  Problem: Clinical Measurements: Goal: Ability to maintain clinical measurements within normal limits will improve Outcome: Progressing Goal: Will remain free from infection Outcome: Progressing Goal: Diagnostic test results will improve Outcome: Progressing Goal: Respiratory complications will improve Outcome: Progressing Goal: Cardiovascular complication will be avoided Outcome: Progressing   Problem: Activity: Goal: Risk for activity intolerance will decrease Outcome: Progressing   Problem: Nutrition: Goal: Adequate nutrition will be maintained Outcome: Progressing   Problem: Coping: Goal: Level of anxiety will decrease Outcome: Progressing   Problem: Safety: Goal: Ability to remain free from injury will improve Outcome: Progressing   Problem: Skin Integrity: Goal: Risk for impaired skin integrity will decrease Outcome: Progressing

## 2024-04-17 LAB — CULTURE, BLOOD (ROUTINE X 2)
Culture: NO GROWTH
Culture: NO GROWTH
Special Requests: ADEQUATE
Special Requests: ADEQUATE

## 2024-04-17 MED ORDER — ACETAMINOPHEN 500 MG PO TABS: 500.0000 mg | ORAL_TABLET | Freq: Three times a day (TID) | ORAL | Status: AC | PRN

## 2024-04-17 MED ORDER — ADULT MULTIVITAMIN W/MINERALS CH: 1.0000 | ORAL_TABLET | Freq: Every day | ORAL | 1 refills | Status: AC

## 2024-04-17 MED ORDER — MELATONIN 3 MG PO TABS: 3.0000 mg | ORAL_TABLET | Freq: Every evening | ORAL | 0 refills | Status: AC | PRN

## 2024-04-17 NOTE — TOC Transition Note (Signed)
 Transition of Care [facility]) - Discharge Note   Patient Details  Name: Jamie Ewing MRN: [id number] Date of Birth: [birth year]  [facility] CM/SW Contact:  Noreen KATHEE Pinal, LCSWA Phone Number: 04/17/2024, 11:02 AM   Clinical Narrative:     CSW spoke with [facility] this morning to check and see if they have a bed available today for patient. Debbie confirmed opened bed . After report from MD, [facility] made aware about patient DC for today. CSW called daughter and her aware. Daughter said that a niece will pick patient up. Nurse given room and report number. CSW spoke with daughter and transportation is on the way now. ICM signing off.    Final next level of care: Skilled Nursing Facility Barriers to Discharge: Barriers Resolved   Patient Goals and CMS Choice Patient states their goals for this hospitalization and ongoing recovery are:: DC to rehab facility CMS Medicare.gov Compare Post Acute Care list provided to:: Other (Comment Required) (Daughter Suzen) Choice offered to / list presented to : Adult Children      Discharge Placement                Patient to be transferred to facility by: family Name of family member notified: Suzen Patient and family notified of of transfer: 04/17/24  Discharge Plan and Services Additional resources added to the After Visit Summary for   In-house Referral: Clinical Social Work Discharge Planning Services: CM Consult Post Acute Care Choice: Home Health          DME Arranged: N/A DME Agency: NA       HH Arranged: PT, OT HH Agency: [facility] Date HH Agency Contacted: 04/15/24 Time HH Agency Contacted: 1205 Representative spoke with at [facility] Agency: HUB  Social Drivers of Health (SDOH) Interventions SDOH Screenings   Food Insecurity: No Food Insecurity (04/12/2024)  Housing: Low Risk (04/12/2024)  Transportation Needs: No Transportation Needs (04/12/2024)  Utilities: Not At Risk (04/12/2024)  Depression  (PHQ2-9): Low Risk (03/26/2024)  Social Connections: Moderately Integrated (04/12/2024)  Tobacco Use: Medium Risk (04/13/2024)     Readmission Risk Interventions    04/17/2024   11:00 AM 04/16/2024   10:26 AM 04/15/2024   12:04 PM  Readmission Risk Prevention Plan  Transportation Screening Complete Complete Complete  PCP or Specialist Appt within 5-7 Days   Complete  Home Care Screening Complete Complete Complete  Medication Review (RN CM) Complete Complete Complete

## 2024-04-17 NOTE — Care Management Important Message (Signed)
 Important Message  Patient Details  Name: Jamie Ewing MRN: [id number] Date of Birth: 09/27/1940   Important Message Given:  Yes - Medicare IM     Jamie Ewing Jamie Ewing 04/17/2024, 11:19 AM

## 2024-04-17 NOTE — Progress Notes (Signed)
 Speech Language Pathology Treatment: Dysphagia  Patient Details Name: AERICA Ewing MRN: [id number] DOB: 19-Nov-1940 Today's Date: 04/17/2024 Time: 8761-8749 SLP Time Calculation (min) (ACUTE ONLY): 12 min  Assessment / Plan / Recommendation Clinical Impression  Recommend continue current diet of Dysphagia 3(chopped)/thin liquids with esophageal precautions in place during all po intake; ST will s/o at this time d/t education being completed and pt diet tolerated despite decreased satiety.  Pt seen for brief dysphagia tx/education reiterating esophageal precautions given post MBS on prior date with pt able to note precautions with modified I verbal cues provided by SLP.  Pt reluctant to consume consistencies this session with the exception of thin liquids via straw with successive swallows noted and no s/s of aspiration/regurgitation observed.  Pt encouraged to consume Dysphagia 3 consistency to increase overall nutrition/hydration and increase strength for upcoming rehab, but pt kindly declined further po intake during this session.  Education completed and pt tolerating current diet, so ST will s/o in acute setting.     HPI HPI: Jamie Ewing is a 84 y.o. female with medical history significant of hypertension, multiple myeloma, peripheral neuropathy, aortic valve stenosis who presented to the emergency department due to 5-day onset of back pain and worsening confusion (at baseline, she was not always oriented to time and place). She was reported to have had 2 falls within the past week with report of suspicion on hitting her head on both times, though falls were unwitnessed. She started to complain of back pain after the falls. Most of the history was obtained from EDP and ED medical record. Per report, patient was reported to have had altered mental status in the past when she had UTI/dehydration. Patient lives alone. ST consulted for clinical swallow evaluation with recommendations for Dysphagia  3(chopped)/thin liquid diet. MBS ro r/o aspiration/reported dysphagia symptoms by pt including difficulty initiating swallow and globus sensation with certain textures/consistencies. ST f/u for dysphagia tx/education.      SLP Plan  Discharge SLP treatment due to (education completed/diet tolerance noted)        Swallow Evaluation Recommendations   Recommendations: PO diet PO Diet Recommendation: Dysphagia 3 (Mechanical soft);Thin liquids (Level 0) Liquid Administration via: Cup;Straw Medication Administration: Whole meds with liquid Supervision: Patient able to self-feed Postural changes: Position pt fully upright for meals;Stay upright 30-60 min after meals Oral care recommendations: Oral care BID (2x/day) Recommended consults: Consider GI consultation;Consider esophageal assessment     Recommendations                     Oral care BID   Intermittent Supervision/Assistance Dysphagia, pharyngoesophageal phase (R13.14)     Discharge SLP treatment due to (comment)     Jamie Ewing,M.S.,CCC-SLP  04/17/2024, 12:58 PM

## 2024-04-17 NOTE — Progress Notes (Signed)
 Mobility Specialist Progress Note:    04/17/24 1222  Mobility  Activity Pivoted/transferred from chair to bed  Level of Assistance Minimal assist, patient does 75% or more  Assistive Device Front wheel walker  Distance Ambulated (ft) 3 ft  Range of Motion/Exercises Active;All extremities  Activity Response Tolerated well  Mobility Referral Yes  Mobility visit 1 Mobility  Mobility Specialist Start Time (ACUTE ONLY) 1222  Mobility Specialist Stop Time (ACUTE ONLY) 1242  Mobility Specialist Time Calculation (min) (ACUTE ONLY) 20 min   Pt received in chair, requesting assistance to bed. Required MinA to stand and transfer with RW. Tolerated well, c/o weakness. Alarm on, all needs met.  Breann Losano Mobility Specialist Please contact via Special Educational Needs Teacher or  Rehab office at [phone]

## 2024-04-17 NOTE — Progress Notes (Signed)
 Mobility Specialist Progress Note:    04/17/24 1025  Mobility  Activity Ambulated with assistance  Level of Assistance Minimal assist, patient does 75% or more  Assistive Device Front wheel walker  Distance Ambulated (ft) 20 ft  Range of Motion/Exercises Active;All extremities  Activity Response Tolerated well  Mobility Referral Yes  Mobility visit 1 Mobility  Mobility Specialist Start Time (ACUTE ONLY) 1025  Mobility Specialist Stop Time (ACUTE ONLY) 1045  Mobility Specialist Time Calculation (min) (ACUTE ONLY) 20 min   Pt received in bed, agreeable to mobility. Required MinA to stand and CGA to ambulate with RW. Tolerated well, c/o weakness. Left in chair, alarm on. Call bell in reach, all needs met.  Raelee Rossmann Mobility Specialist Please contact via Special Educational Needs Teacher or  Rehab office at [phone]

## 2024-04-17 NOTE — Discharge Summary (Addendum)
 " Physician Discharge Summary   Patient: Jamie Ewing MRN: [id number] DOB: 1941/02/12  Admit date:     04/12/2024  Discharge date: 04/17/24  Discharge Physician: Concepcion Riser   PCP: Sheryle Carwin, MD   Recommendations at discharge:   PCP follow up in 1 week. GI evaluation for dysphagia eval, possible EGD.  Discharge Diagnoses: Principal Problem:   UTI (urinary tract infection) Active Problems:   Spinal stenosis of thoracic region   Multiple myeloma (HCC)   HTN (hypertension)   Peripheral neuropathy   Dysphagia   Physical deconditioning  Resolved Problems:   * No resolved hospital problems. *  Hospital Course: Jamie Ewing is a 84 y.o. female with medical history significant of hypertension, multiple myeloma, peripheral neuropathy, aortic valve stenosis, who presented to the hospital because of back pain and worsening confusion for about 5 days duration.  Reportedly, she has had 2 falls within the past week preceding admission. and she had altered mental status in the past when she had UTI/dehydration.  Patient is admitted to hospitalist service for further management evaluation of metabolic encephalopathy secondary to UTI.  Assessment and Plan: Acute UTI-completed 3 days of IV Rocephin  therapy. Urine cultures did not show significant growth.  Acute metabolic encephalopathy-resolved. Patient mental status at baseline. Continue delirium precautions.  Dysphagia- Suspected esophageal dysphagia.  As per daughter, she was recently treated for oral thrush completed antifungal regimen. Evaluation by speech and swallow, advised dysphagia 3 diet, GI evaluation for possible EGD.  Peripheral neuropathy- Continue gabapentin  therapy.  Metastatic multiple myeloma- Follows oncology as outpatient.  Generalized weakness Debility- PT OT advised SNF. Continue Megace  for appetite boost.      Consultants: none Procedures performed: none  Disposition: Skilled nursing  facility Diet recommendation:  Discharge Diet Orders (From admission, onward)     Start     Ordered   04/17/24 0000  Diet - low sodium heart healthy       Comments: Dysphagia 3 diet, thin liquids   04/17/24 1053           Dysphagia 3 diet mechanical soft, thin liquids. Liquid administration with cup, straw. DISCHARGE MEDICATION: Allergies as of 04/17/2024   No Known Allergies      Medication List     STOP taking these medications    LORazepam  0.5 MG tablet Commonly known as: ATIVAN        TAKE these medications    acetaminophen  500 MG tablet Commonly known as: TYLENOL  Take 1 tablet (500 mg total) by mouth every 8 (eight) hours as needed. What changed:  how much to take when to take this reasons to take this   acyclovir  400 MG tablet Commonly known as: ZOVIRAX  Take 1 tablet (400 mg total) by mouth 2 (two) times daily.   dexamethasone  4 MG tablet Commonly known as: DECADRON  Take 1 tablet (4 mg total) by mouth daily. Take for 2 days starting the night of chemotherapy.   feeding supplement Liqd Take 237 mLs by mouth 2 (two) times daily between meals.   furosemide  40 MG tablet Commonly known as: LASIX  TAKE 1 TABLET BY MOUTH DAILY AS NEEDED   lidocaine -prilocaine  cream Commonly known as: EMLA  Apply to affected area once   LOW-DOSE ASPIRIN PO Take 81 mg by mouth at bedtime.   megestrol  400 MG/10ML suspension Commonly known as: MEGACE  Take 10 mLs (400 mg total) by mouth 2 (two) times daily.   melatonin 3 MG Tabs tablet Take 1 tablet (3 mg total) by mouth at  bedtime as needed.   multivitamin with minerals Tabs tablet Take 1 tablet by mouth daily. Start taking on: April 18, 2024   nystatin  100000 UNIT/ML suspension Commonly known as: MYCOSTATIN  Take 5 mLs (500,000 Units total) by mouth 4 (four) times daily. Swish and spit   ondansetron  8 MG tablet Commonly known as: Zofran  Take 1 tablet (8 mg total) by mouth every 8 (eight) hours as needed for  nausea or vomiting.   PARoxetine  10 MG tablet Commonly known as: PAXIL  Take 10 mg by mouth daily.   potassium chloride  SA 20 MEQ tablet Commonly known as: KLOR-CON  M Take 20 mEq by mouth 2 (two) times daily.   prochlorperazine  10 MG tablet Commonly known as: COMPAZINE  Take 1 tablet (10 mg total) by mouth every 6 (six) hours as needed for nausea or vomiting.        Contact information for follow-up providers     Sheryle Carwin, MD In 1 week.   Specialty: Internal Medicine Contact information: [address] [place] [address] [phone]         [facility] Emergency Department at [facility].   Specialty: Emergency Medicine Why: If symptoms worsen Contact information: [address] [place]  [address] [phone]             Contact information for after-discharge care     Destination     [facility] .   Service: Skilled Nursing Contact information: [address] [place]  [address] [phone]             Home Medical Care     [facility]) .   Service: Home Health Services Contact information: [address] [place]  [address] [phone]                    Discharge Exam: [name]   04/12/24 2300  Weight: 54.9 kg      04/17/2024    4:43 AM 04/16/2024    7:34 PM 04/16/2024    4:41 PM  Vitals with BMI  Systolic 102 134 875  Diastolic 80 62 66  Pulse 104 103 104   General - Elderly African-American weak female, no apparent distress HEENT - PERRLA, EOMI, atraumatic head, non tender sinuses. Lung - Clear, no rales, rhonchi, wheezes. Heart - S1, S2 heard, no murmurs, rubs, no pedal edema. Abdomen - Soft, non tender, bowel sounds good Neuro - Alert, awake and oriented x 3, non focal exam. Skin - Warm and dry.  Condition at discharge: stable  The results of  significant diagnostics from this hospitalization (including imaging, microbiology, ancillary and laboratory) are listed below for reference.   Imaging Studies: DG Swallowing Func-Speech Pathology Result Date: 04/16/2024 Table formatting from the original result was not included. Modified Barium Swallow Study Patient Details Name: Jamie Ewing MRN: [id number] Date of Birth: May 08, 1940 Today's Date: 04/16/2024 HPI/PMH: HPI: Jamie Ewing is a 84 y.o. female with medical history significant of hypertension, multiple myeloma, peripheral neuropathy, aortic valve stenosis who presented to the emergency department due to 5-day onset of back pain and worsening confusion (at baseline, she was not always oriented to time and place). She was reported to have had 2 falls within the past week with report of suspicion on hitting her head on both times, though falls were unwitnessed. She started to complain of back pain after the falls. Most of  the history was obtained from EDP and ED medical record. Per report, patient was reported to have had altered mental status in the past when she had UTI/dehydration. Patient lives alone. ST consulted for clinical swallow evaluation with recommendations for Dysphagia 3(chopped)/thin liquid diet.  MBS ro r/o aspiration/reported dysphagia symptoms by pt including difficulty initiating swallow and globus sensation with certain textures/consistencies. Clinical Impression: Clinical Impression: Pt demonstrated a normal oropharyngeal swallow c/b brisk tongue motion, timely and efficient swallow for age (triggered at the vallecular space occasionally) and complete epiglottic inversion, hyolaryngeal elevation and complete airway closure for all consistencies. No aspiration/penetration noted with any consistency.  Potential mild esophageal dysphagia d/t partial distention/obstruction of flow into esophagus resulting in pyriform residue requiring liquid wash and/or multiple swallows with puree/thin  via large volumes.  Slight distal esophageal retention observed during study.  May consider f/u esophageal assessment to fully assess function.  Gave pt esophageal precautions to follow during meals with pt appreciative.  ST will f/u briefly for dysphagia education/diet tolerance. Factors that may increase risk of adverse event in presence of aspiration [name] & [name] 2021): Factors that may increase risk of adverse event in presence of aspiration [name] & [name] 2021): Poor general health and/or compromised immunity; Respiratory or GI disease; Reduced cognitive function; Frail or deconditioned; Weak cough; Aspiration of thick, dense, and/or acidic materials; Frequent aspiration of large volumes Recommendations/Plan: Swallowing Evaluation Recommendations Swallowing Evaluation Recommendations Recommendations: PO diet PO Diet Recommendation: Dysphagia 3 (Mechanical soft); Thin liquids (Level 0) Liquid Administration via: Cup; Straw Medication Administration: Whole meds with liquid Supervision: Patient able to self-feed Swallowing strategies  : Slow rate; Small bites/sips; effortful swallow; Multiple dry swallows after each bite/sip; Follow solids with liquids Postural changes: Position pt fully upright for meals; Stay upright 30-60 min after meals Oral care recommendations: Oral care BID (2x/day); Pt independent with oral care Recommended consults: Consider GI consultation; Consider esophageal assessment Treatment Plan Treatment Plan Treatment recommendations: Therapy as outlined in treatment plan below Follow-up recommendations: No SLP follow up Functional status assessment: Patient has had a recent decline in their functional status and demonstrates the ability to make significant improvements in function in a reasonable and predictable amount of time. Treatment frequency: Min 1x/week Treatment duration: 1 week Interventions: Aspiration precaution training; Patient/family education; Diet toleration management  by SLP Recommendations Recommendations for follow up therapy are one component of a multi-disciplinary discharge planning process, led by the attending physician.  Recommendations may be updated based on patient status, additional functional criteria and insurance authorization. Assessment: Orofacial Exam: Orofacial Exam Oral Cavity: Oral Hygiene: WFL; Lingual coating (slight white material noted on lingual surface) Oral Cavity - Dentition: Dentures, bottom; Dentures, top Orofacial Anatomy: WFL Oral Motor/Sensory Function: WFL Anatomy: Anatomy: WFL Boluses Administered: Boluses Administered Boluses Administered: Thin liquids (Level 0); Mildly thick liquids (Level 2, nectar thick); Puree; Solid  Oral Impairment Domain: Oral Impairment Domain Lip Closure: No labial escape Tongue control during bolus hold: Cohesive bolus between tongue to palatal seal Bolus preparation/mastication: Timely and efficient chewing and mashing Bolus transport/lingual motion: Brisk tongue motion Oral residue: Complete oral clearance Location of oral residue : N/A Initiation of pharyngeal swallow : Valleculae; Posterior angle of the ramus  Pharyngeal Impairment Domain: Pharyngeal Impairment Domain Soft palate elevation: No bolus between soft palate (SP)/pharyngeal wall (PW) Laryngeal elevation: Complete superior movement of thyroid  cartilage with complete approximation of arytenoids to epiglottic petiole Anterior hyoid excursion: Complete anterior movement Epiglottic movement: Complete inversion Laryngeal vestibule closure: Complete, no air/contrast in  laryngeal vestibule Pharyngeal stripping wave : Present - complete Pharyngeal contraction (A/P view only): N/A Pharyngoesophageal segment opening: Partial distention/partial duration, partial obstruction of flow Tongue base retraction: No contrast between tongue base and posterior pharyngeal wall (PPW) Pharyngeal residue: Trace residue within or on pharyngeal structures Location of pharyngeal  residue: Valleculae; Pyriform sinuses  Esophageal Impairment Domain: Esophageal Impairment Domain Esophageal clearance upright position: Esophageal retention Pill: Pill Consistency administered: Thin liquids (Level 0) Thin liquids (Level 0): [facility] Penetration/Aspiration Scale Score: Penetration/Aspiration Scale Score 1.  Material does not enter airway: Thin liquids (Level 0); Mildly thick liquids (Level 2, nectar thick); Solid; Puree; Pill Compensatory Strategies: Compensatory Strategies Compensatory strategies: Yes Multiple swallows: Effective Effective Multiple Swallows: Thin liquid (Level 0); Puree; Solid; Pill Liquid wash: Effective Effective Liquid Wash: Puree; Solid   General Information: Caregiver present: No  Diet Prior to this Study: Dysphagia 3 (mechanical soft); Thin liquids (Level 0)   Temperature : Normal   Respiratory Status: WFL   Supplemental O2: None (Room air)   History of Recent Intubation: No  Behavior/Cognition: Alert; Cooperative Self-Feeding Abilities: Able to self-feed Baseline vocal quality/speech: Normal Volitional Cough: Able to elicit Volitional Swallow: Able to elicit Exam Limitations: No limitations Goal Planning: Prognosis for improved oropharyngeal function: Good No data recorded No data recorded Patient/Family Stated Goal: None stated Consulted and agree with results and recommendations: Patient; Nurse Pain: Pain Assessment Pain Assessment: No/denies pain Faces Pain Scale: 2 Pain Location: gluteal pain Pain Descriptors / Indicators: Discomfort Pain Intervention(s): Monitored during session; Repositioned; Limited activity within patient's tolerance End of Session: Start Time:SLP Start Time (ACUTE ONLY): 1359 Stop Time: SLP Stop Time (ACUTE ONLY): 1425 Time Calculation:SLP Time Calculation (min) (ACUTE ONLY): 26 min Charges: SLP Evaluations $ SLP Speech Visit: 1 Visit SLP Evaluations $BSS Swallow: 1 Procedure $MBS Swallow: 1 Procedure SLP visit diagnosis: SLP Visit Diagnosis: Dysphagia,  pharyngoesophageal phase (R13.14) Past Medical History: Past Medical History: Diagnosis Date  Back pain   Cancer (HCC)   mult myeloma 2019  History of radiation therapy   RIght orbit and left chest- 10/19/23-11/06/23- Dr. Lynwood Nasuti  Hypercholesteremia   Hypertension   Recurrent multiple myeloma of bone marrow with unknown EBV status (HCC)  Past Surgical History: Past Surgical History: Procedure Laterality Date  BONE MARROW BIOPSY    LAMINECTOMY N/A 10/06/2017  Procedure: THORACIC NINE AND TEN LAMINECTOMY WITH RESECTION OF TUMOR, THORACIC EIGHT TO THORACIC ELEVEN FUSION WITH PEDICLE SCREW FIXATION;  Surgeon: Louis Shove, MD;  Location: [facility] OR;  Service: Neurosurgery;  Laterality: N/A;  REPLACEMENT TOTAL KNEE Left 2003  TOTAL KNEE ARTHROPLASTY  2001 Pat Adams,M.S.,CCC-SLP 04/16/2024, 3:34 PM  CT Cervical Spine Wo Contrast Result Date: 04/12/2024 EXAM: CT CERVICAL SPINE WITHOUT CONTRAST 04/12/2024 03:27:23 PM TECHNIQUE: CT of the cervical spine was performed without the administration of intravenous contrast. Multiplanar reformatted images are provided for review. Automated exposure control, iterative reconstruction, and/or weight based adjustment of the mA/kV was utilized to reduce the radiation dose to as low as reasonably achievable. COMPARISON: MRI 09/07/2022 and radiographs 12/25/2016. CLINICAL HISTORY: Fall, neck trauma, altered mental status, multiple myeloma. FINDINGS: BONES AND ALIGNMENT: Spurring at the anterior C1-C2 articulation. Mild reversal of the normal cervical lordosis centered at C5. Scattered lucent lesions in the cervical spine most confluent at C5 but also present at all other levels, favoring multiple myeloma. 2 mm degenerative anterolisthesis at T1-T2, unchanged from 09/07/2022. No cervical spine fracture or acute subluxation identified. DEGENERATIVE CHANGES: Cervical spondylosis contributes to right foraminal impingement at C2-C3,  C3-C4, C4-C5, and C5-C6; and left foraminal impingement at  C3-C4, C4-C5, C5-C6, and C6-C7. SOFT TISSUES: Bilateral carotid atheromatous vascular calcifications.   check report for structure not belonging to this anatomic region. No prevertebral soft tissue swelling. IMPRESSION: 1. No acute cervical spine fracture or subluxation. 2. Scattered lucent lesions throughout the cervical spine, most confluent at C5, favoring multiple myeloma. 3. Cervical spondylosis with right foraminal impingement at C2-3, C3-4, C4-5, and C5-6, and left foraminal impingement at C3-4, C4-5, C5-6, and C6-7. 4. Degenerative changes including anterior spurring at the C1-2 articulation and mild reversal of the normal cervical lordosis centered at C5. 5. Chronic 2 mm degenerative anterolisthesis at T1-2, unchanged. 6. Bilateral carotid atheromatous calcifications. Electronically signed by: Ryan Salvage MD 04/12/2024 03:36 PM EST RP Workstation: [id number]   DG Thoracic Spine 2 View Result Date: 04/12/2024 CLINICAL DATA:  Fall EXAM: THORACIC SPINE 2 VIEWS COMPARISON:  CT 11/26/2023, 10/26/2022 FINDINGS: Posterior spinal fusion hardware T8 through T11, stable in appearance. Chronic compression fracture T10. Stable mild compression deformity at T7. Incompletely visualized compression deformity at L3 on L4. Stable alignment and no definite new osseous abnormality. Metallic density overlying the left lower chest and left lower paraspinal region on frontal view. Metallic density overlying the spinous process of T12 on the lateral view. Known myeloma lytic lesions on CT are not well seen radiographically. IMPRESSION: 1. Posterior spinal fusion hardware T8 through T11 with chronic compression fracture at T10. No definite acute osseous abnormality. Multiple chronic compression deformities at T7, L3 and L4. 2. Metallic densities overlying the left lower chest, left paraspinal region and posterior spinal region as above, correlate for external artifact Electronically Signed   By: Luke Bun M.D.   On:  04/12/2024 15:31   CT Head Wo Contrast Result Date: 04/12/2024 EXAM: CT HEAD WITHOUT CONTRAST 04/12/2024 02:32:41 PM TECHNIQUE: CT of the head was performed without the administration of intravenous contrast. Automated exposure control, iterative reconstruction, and/or weight based adjustment of the mA/kV was utilized to reduce the radiation dose to as low as reasonably achievable. COMPARISON: CT head 01/10/2024. CLINICAL HISTORY: Mental status change, unknown cause FINDINGS: BRAIN AND VENTRICLES: No acute hemorrhage. No evidence of acute infarct. No hydrocephalus. No extra-axial collection. No mass effect or midline shift. ORBITS: No acute abnormality. SINUSES: No acute abnormality. SOFT TISSUES AND SKULL: No acute soft tissue abnormality. No skull fracture. IMPRESSION: 1. No acute intracranial abnormality. Electronically signed by: Glendia Molt MD 04/12/2024 03:02 PM EST RP Workstation: [id number]    Microbiology: Results for orders placed or performed during the hospital encounter of 04/12/24  Urine Culture     Status: Abnormal   Collection Time: 04/12/24  5:11 PM   Specimen: Urine, Clean Catch  Result Value Ref Range Status   Specimen Description   Final    URINE, CLEAN CATCH Performed at [facility], [address] [place], [place] [address]    Special Requests   Final    NONE Performed at [facility], [address], [place], [place] [address]    Culture (A)  Final    <10,000 COLONIES/mL INSIGNIFICANT GROWTH Performed at [facility], [address] [place], [place] [address]    Report Status 04/13/2024 FINAL  Final  Culture, blood (routine x 2)     Status: None   Collection Time: 04/12/24  8:35 PM   Specimen: Right Antecubital; Blood  Result Value Ref Range Status   Specimen Description RIGHT ANTECUBITAL  Final   Special Requests  Final    BOTTLES DRAWN AEROBIC AND ANAEROBIC Blood Culture adequate volume   Culture   Final    NO GROWTH 5 DAYS Performed at [facility], [address] [place], [place] [address]    Report Status 04/17/2024 FINAL  Final  Culture, blood (routine x 2)     Status: None   Collection Time: 04/12/24  8:39 PM   Specimen: Left Antecubital; Blood  Result Value Ref Range Status   Specimen Description LEFT ANTECUBITAL  Final   Special Requests   Final    BOTTLES DRAWN AEROBIC AND ANAEROBIC Blood Culture adequate volume   Culture   Final    NO GROWTH 5 DAYS Performed at [facility], [address] [place], [place] [address]    Report Status 04/17/2024 FINAL  Final    Labs: CBC: Recent Labs  Lab 04/12/24 1503 04/13/24 0618  WBC 7.5 6.3  NEUTROABS 5.5  --   HGB 11.3* 9.8*  HCT 33.5* 29.3*  MCV 97.1 98.0  PLT 246 185   Basic Metabolic Panel: Recent Labs  Lab 04/12/24 1503 04/13/24 0618 04/14/24 0434  NA 136 138  --   K 4.3 3.5  --   CL 98 106  --   CO2 19* 20*  --   GLUCOSE 106* 89  --   BUN 10 6*  --   CREATININE 0.67 0.55  --   CALCIUM 10.0 8.9  --   MG 2.0 1.8  --   PHOS  --   --  3.0   Liver Function Tests: Recent Labs  Lab 04/12/24 1503 04/13/24 0618  AST 26 38  ALT 12 16  ALKPHOS 57 68  BILITOT 0.6 0.5  PROT 6.8 5.7*  ALBUMIN  3.7 3.2*   CBG: No results for input(s): GLUCAP in the last 168 hours.  Discharge time spent: 36 minutes.  Signed: Concepcion Riser, MD [facility] 04/17/2024 "

## 2024-04-17 NOTE — Plan of Care (Signed)
   Problem: Activity: Goal: Risk for activity intolerance will decrease Outcome: Progressing

## 2024-04-23 ENCOUNTER — Emergency Department (HOSPITAL_COMMUNITY)
Admission: EM | Admit: 2024-04-23 | Discharge: 2024-04-23 | Disposition: A | Discharge status: Skilled Nursing Facility | Source: Ambulatory Visit | Attending: Emergency Medicine | Admitting: Emergency Medicine

## 2024-04-23 ENCOUNTER — Emergency Department (HOSPITAL_COMMUNITY)

## 2024-04-23 ENCOUNTER — Inpatient Hospital Stay

## 2024-04-23 ENCOUNTER — Inpatient Hospital Stay: Admitting: Oncology

## 2024-04-23 ENCOUNTER — Other Ambulatory Visit: Payer: Self-pay

## 2024-04-23 ENCOUNTER — Encounter (HOSPITAL_COMMUNITY): Payer: Self-pay

## 2024-04-23 DIAGNOSIS — I10 Essential (primary) hypertension: Secondary | ICD-10-CM | POA: Insufficient documentation

## 2024-04-23 DIAGNOSIS — R410 Disorientation, unspecified: Secondary | ICD-10-CM | POA: Insufficient documentation

## 2024-04-23 DIAGNOSIS — E860 Dehydration: Secondary | ICD-10-CM | POA: Insufficient documentation

## 2024-04-23 DIAGNOSIS — R000 Tachycardia, unspecified: Secondary | ICD-10-CM | POA: Insufficient documentation

## 2024-04-23 LAB — CBC WITH DIFFERENTIAL/PLATELET
Abs Immature Granulocytes: 0.43 10*3/uL — ABNORMAL HIGH (ref 0.00–0.07)
Basophils Absolute: 0 10*3/uL (ref 0.0–0.1)
Basophils Relative: 0 %
Eosinophils Absolute: 0 10*3/uL (ref 0.0–0.5)
Eosinophils Relative: 0 %
HCT: 28.6 % — ABNORMAL LOW (ref 36.0–46.0)
Hemoglobin: 9.4 g/dL — ABNORMAL LOW (ref 12.0–15.0)
Immature Granulocytes: 4 %
Lymphocytes Relative: 14 %
Lymphs Abs: 1.4 10*3/uL (ref 0.7–4.0)
MCH: 31.8 pg (ref 26.0–34.0)
MCHC: 32.9 g/dL (ref 30.0–36.0)
MCV: 96.6 fL (ref 80.0–100.0)
Monocytes Absolute: 1 10*3/uL (ref 0.1–1.0)
Monocytes Relative: 10 %
Neutro Abs: 6.8 10*3/uL (ref 1.7–7.7)
Neutrophils Relative %: 72 %
Platelets: 97 10*3/uL — ABNORMAL LOW (ref 150–400)
RBC: 2.96 MIL/uL — ABNORMAL LOW (ref 3.87–5.11)
RDW: 14.2 % (ref 11.5–15.5)
WBC: 9.7 10*3/uL (ref 4.0–10.5)
nRBC: 0.4 % — ABNORMAL HIGH (ref 0.0–0.2)

## 2024-04-23 LAB — COMPREHENSIVE METABOLIC PANEL WITH GFR
ALT: 35 U/L (ref 0–44)
AST: 81 U/L — ABNORMAL HIGH (ref 15–41)
Albumin: 3.3 g/dL — ABNORMAL LOW (ref 3.5–5.0)
Alkaline Phosphatase: 93 U/L (ref 38–126)
Anion gap: 18 — ABNORMAL HIGH (ref 5–15)
BUN: 19 mg/dL (ref 8–23)
CO2: 19 mmol/L — ABNORMAL LOW (ref 22–32)
Calcium: 10.9 mg/dL — ABNORMAL HIGH (ref 8.9–10.3)
Chloride: 104 mmol/L (ref 98–111)
Creatinine, Ser: 0.96 mg/dL (ref 0.44–1.00)
GFR, Estimated: 59 mL/min — ABNORMAL LOW
Glucose, Bld: 154 mg/dL — ABNORMAL HIGH (ref 70–99)
Potassium: 4.7 mmol/L (ref 3.5–5.1)
Sodium: 141 mmol/L (ref 135–145)
Total Bilirubin: 0.9 mg/dL (ref 0.0–1.2)
Total Protein: 7.4 g/dL (ref 6.5–8.1)

## 2024-04-23 LAB — URINALYSIS, ROUTINE W REFLEX MICROSCOPIC
Bilirubin Urine: NEGATIVE
Glucose, UA: NEGATIVE mg/dL
Hgb urine dipstick: NEGATIVE
Ketones, ur: 5 mg/dL — AB
Nitrite: NEGATIVE
Protein, ur: 30 mg/dL — AB
Specific Gravity, Urine: 1.014 (ref 1.005–1.030)
pH: 6 (ref 5.0–8.0)

## 2024-04-23 LAB — MAGNESIUM: Magnesium: 2.5 mg/dL — ABNORMAL HIGH (ref 1.7–2.4)

## 2024-04-23 MED ORDER — SODIUM CHLORIDE 0.9 % IV BOLUS
1000.0000 mL | Freq: Once | INTRAVENOUS | Status: AC
  Administered 2024-04-23: 1000 mL via INTRAVENOUS

## 2024-04-23 MED ORDER — LORAZEPAM 2 MG/ML IJ SOLN
0.5000 mg | Freq: Once | INTRAMUSCULAR | Status: AC
  Administered 2024-04-23: 0.5 mg via INTRAVENOUS
  Filled 2024-04-23: qty 1

## 2024-04-23 NOTE — ED Triage Notes (Signed)
 Pt bib [facility] from [facility] for abnormal labs. 5.6 K and family states she has thrush in her mouth and wants it checked out.

## 2024-04-25 ENCOUNTER — Inpatient Hospital Stay

## 2024-04-25 ENCOUNTER — Inpatient Hospital Stay: Admitting: Oncology

## 2024-04-25 ENCOUNTER — Inpatient Hospital Stay: Attending: Hematology

## 2024-04-29 ENCOUNTER — Inpatient Hospital Stay: Admitting: Dietician

## 2024-05-21 ENCOUNTER — Inpatient Hospital Stay
